# Patient Record
Sex: Female | Born: 1965 | Race: White | Hispanic: No | Marital: Single | Smoking: Never smoker
Health system: Southern US, Community
[De-identification: ages and names within clinical notes are randomized; demographics above are authoritative.]

## PROBLEM LIST (undated history)

## (undated) DIAGNOSIS — M1990 Unspecified osteoarthritis, unspecified site: Secondary | ICD-10-CM

## (undated) DIAGNOSIS — K219 Gastro-esophageal reflux disease without esophagitis: Secondary | ICD-10-CM

## (undated) DIAGNOSIS — A0472 Enterocolitis due to Clostridium difficile, not specified as recurrent: Secondary | ICD-10-CM

## (undated) DIAGNOSIS — S060X9A Concussion with loss of consciousness of unspecified duration, initial encounter: Secondary | ICD-10-CM

## (undated) DIAGNOSIS — F329 Major depressive disorder, single episode, unspecified: Secondary | ICD-10-CM

## (undated) DIAGNOSIS — Q211 Atrial septal defect, unspecified: Secondary | ICD-10-CM

## (undated) DIAGNOSIS — R519 Headache, unspecified: Secondary | ICD-10-CM

## (undated) DIAGNOSIS — R51 Headache: Secondary | ICD-10-CM

## (undated) DIAGNOSIS — K589 Irritable bowel syndrome without diarrhea: Secondary | ICD-10-CM

## (undated) DIAGNOSIS — U071 COVID-19: Secondary | ICD-10-CM

## (undated) DIAGNOSIS — Z8719 Personal history of other diseases of the digestive system: Secondary | ICD-10-CM

## (undated) DIAGNOSIS — I499 Cardiac arrhythmia, unspecified: Secondary | ICD-10-CM

## (undated) DIAGNOSIS — S92919A Unspecified fracture of unspecified toe(s), initial encounter for closed fracture: Secondary | ICD-10-CM

## (undated) DIAGNOSIS — R0600 Dyspnea, unspecified: Secondary | ICD-10-CM

## (undated) DIAGNOSIS — L570 Actinic keratosis: Secondary | ICD-10-CM

## (undated) DIAGNOSIS — R011 Cardiac murmur, unspecified: Secondary | ICD-10-CM

## (undated) DIAGNOSIS — T7840XA Allergy, unspecified, initial encounter: Secondary | ICD-10-CM

## (undated) DIAGNOSIS — D649 Anemia, unspecified: Secondary | ICD-10-CM

## (undated) DIAGNOSIS — Z8619 Personal history of other infectious and parasitic diseases: Secondary | ICD-10-CM

## (undated) DIAGNOSIS — F32A Depression, unspecified: Secondary | ICD-10-CM

## (undated) HISTORY — PX: CARDIAC SURGERY: SHX584

## (undated) HISTORY — DX: Allergy, unspecified, initial encounter: T78.40XA

## (undated) HISTORY — DX: Depression, unspecified: F32.A

## (undated) HISTORY — DX: Major depressive disorder, single episode, unspecified: F32.9

## (undated) HISTORY — DX: Enterocolitis due to Clostridium difficile, not specified as recurrent: A04.72

## (undated) HISTORY — DX: Personal history of other infectious and parasitic diseases: Z86.19

## (undated) HISTORY — PX: ABDOMINAL HYSTERECTOMY: SHX81

## (undated) HISTORY — DX: Actinic keratosis: L57.0

---
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
## 2004-10-28 ENCOUNTER — Ambulatory Visit: Payer: Self-pay | Admitting: Family Medicine

## 2005-06-01 ENCOUNTER — Ambulatory Visit: Payer: Self-pay | Admitting: Unknown Physician Specialty

## 2005-06-04 ENCOUNTER — Emergency Department: Payer: Self-pay | Admitting: Emergency Medicine

## 2006-01-08 ENCOUNTER — Emergency Department: Payer: Self-pay | Admitting: Internal Medicine

## 2008-07-22 ENCOUNTER — Emergency Department: Payer: Self-pay | Admitting: Unknown Physician Specialty

## 2008-08-07 ENCOUNTER — Ambulatory Visit: Payer: Self-pay | Admitting: Unknown Physician Specialty

## 2008-12-21 ENCOUNTER — Ambulatory Visit: Payer: Self-pay | Admitting: Unknown Physician Specialty

## 2009-01-08 ENCOUNTER — Inpatient Hospital Stay: Payer: Self-pay | Admitting: Unknown Physician Specialty

## 2009-01-08 HISTORY — PX: BILATERAL SALPINGOOPHORECTOMY: SHX1223

## 2009-01-08 HISTORY — PX: TOTAL ABDOMINAL HYSTERECTOMY W/ BILATERAL SALPINGOOPHORECTOMY: SHX83

## 2009-01-08 HISTORY — PX: SUPRACERVICAL ABDOMINAL HYSTERECTOMY: SHX5393

## 2009-11-03 ENCOUNTER — Emergency Department: Payer: Self-pay | Admitting: Emergency Medicine

## 2009-12-30 ENCOUNTER — Ambulatory Visit: Payer: Self-pay | Admitting: Unknown Physician Specialty

## 2009-12-30 LAB — HM COLONOSCOPY

## 2010-09-26 LAB — HM MAMMOGRAPHY

## 2011-06-02 DIAGNOSIS — J309 Allergic rhinitis, unspecified: Secondary | ICD-10-CM | POA: Insufficient documentation

## 2011-06-02 DIAGNOSIS — G43909 Migraine, unspecified, not intractable, without status migrainosus: Secondary | ICD-10-CM | POA: Insufficient documentation

## 2011-09-16 DIAGNOSIS — E559 Vitamin D deficiency, unspecified: Secondary | ICD-10-CM | POA: Insufficient documentation

## 2011-09-16 DIAGNOSIS — E8941 Symptomatic postprocedural ovarian failure: Secondary | ICD-10-CM | POA: Insufficient documentation

## 2011-09-16 DIAGNOSIS — E8940 Asymptomatic postprocedural ovarian failure: Secondary | ICD-10-CM | POA: Insufficient documentation

## 2012-01-13 DIAGNOSIS — E561 Deficiency of vitamin K: Secondary | ICD-10-CM | POA: Insufficient documentation

## 2012-01-13 DIAGNOSIS — E54 Ascorbic acid deficiency: Secondary | ICD-10-CM | POA: Insufficient documentation

## 2012-06-13 DIAGNOSIS — K625 Hemorrhage of anus and rectum: Secondary | ICD-10-CM | POA: Insufficient documentation

## 2012-06-13 DIAGNOSIS — F321 Major depressive disorder, single episode, moderate: Secondary | ICD-10-CM | POA: Insufficient documentation

## 2012-08-02 ENCOUNTER — Emergency Department: Payer: Self-pay | Admitting: Emergency Medicine

## 2012-08-02 LAB — COMPREHENSIVE METABOLIC PANEL
Alkaline Phosphatase: 72 U/L (ref 50–136)
Anion Gap: 10 (ref 7–16)
BUN: 4 mg/dL — ABNORMAL LOW (ref 7–18)
Bilirubin,Total: 1.2 mg/dL — ABNORMAL HIGH (ref 0.2–1.0)
Co2: 24 mmol/L (ref 21–32)
Creatinine: 0.81 mg/dL (ref 0.60–1.30)
EGFR (Non-African Amer.): >60
Glucose: 119 mg/dL — ABNORMAL HIGH (ref 65–99)
Potassium: 2.9 mmol/L — ABNORMAL LOW (ref 3.5–5.1)
SGOT(AST): 26 U/L (ref 15–37)
Sodium: 143 mmol/L (ref 136–145)
Total Protein: 7.5 g/dL (ref 6.4–8.2)

## 2012-08-02 LAB — CBC
MCH: 27 pg (ref 26.0–34.0)
MCHC: 34.7 g/dL (ref 32.0–36.0)
WBC: 4.9 10*3/uL (ref 3.6–11.0)

## 2012-08-04 DIAGNOSIS — T7411XA Adult physical abuse, confirmed, initial encounter: Secondary | ICD-10-CM | POA: Insufficient documentation

## 2012-10-05 DIAGNOSIS — R768 Other specified abnormal immunological findings in serum: Secondary | ICD-10-CM | POA: Insufficient documentation

## 2013-01-25 DIAGNOSIS — J9600 Acute respiratory failure, unspecified whether with hypoxia or hypercapnia: Secondary | ICD-10-CM | POA: Insufficient documentation

## 2013-01-25 DIAGNOSIS — J9690 Respiratory failure, unspecified, unspecified whether with hypoxia or hypercapnia: Secondary | ICD-10-CM | POA: Insufficient documentation

## 2013-03-06 ENCOUNTER — Ambulatory Visit: Payer: Self-pay | Admitting: Family Medicine

## 2013-03-20 ENCOUNTER — Ambulatory Visit: Payer: Self-pay | Admitting: Family Medicine

## 2013-04-26 ENCOUNTER — Emergency Department: Payer: Self-pay | Admitting: Emergency Medicine

## 2013-04-28 DIAGNOSIS — R079 Chest pain, unspecified: Secondary | ICD-10-CM | POA: Insufficient documentation

## 2013-04-28 DIAGNOSIS — Q211 Atrial septal defect, unspecified: Secondary | ICD-10-CM | POA: Insufficient documentation

## 2013-04-28 DIAGNOSIS — I370 Nonrheumatic pulmonary valve stenosis: Secondary | ICD-10-CM

## 2013-05-04 ENCOUNTER — Ambulatory Visit: Payer: Self-pay | Admitting: Family Medicine

## 2013-06-21 DIAGNOSIS — I351 Nonrheumatic aortic (valve) insufficiency: Secondary | ICD-10-CM | POA: Insufficient documentation

## 2013-06-21 DIAGNOSIS — I340 Nonrheumatic mitral (valve) insufficiency: Secondary | ICD-10-CM | POA: Insufficient documentation

## 2013-06-21 DIAGNOSIS — I071 Rheumatic tricuspid insufficiency: Secondary | ICD-10-CM | POA: Insufficient documentation

## 2013-06-28 ENCOUNTER — Ambulatory Visit: Payer: Self-pay | Admitting: Neurology

## 2013-07-03 DIAGNOSIS — R42 Dizziness and giddiness: Secondary | ICD-10-CM | POA: Insufficient documentation

## 2013-07-03 DIAGNOSIS — S060X9A Concussion with loss of consciousness of unspecified duration, initial encounter: Secondary | ICD-10-CM | POA: Insufficient documentation

## 2013-07-03 DIAGNOSIS — S060XAA Concussion with loss of consciousness status unknown, initial encounter: Secondary | ICD-10-CM | POA: Insufficient documentation

## 2013-07-03 DIAGNOSIS — M79609 Pain in unspecified limb: Secondary | ICD-10-CM | POA: Insufficient documentation

## 2013-07-03 DIAGNOSIS — M542 Cervicalgia: Secondary | ICD-10-CM | POA: Insufficient documentation

## 2013-07-03 HISTORY — DX: Concussion with loss of consciousness status unknown, initial encounter: S06.0XAA

## 2013-07-03 HISTORY — DX: Concussion with loss of consciousness of unspecified duration, initial encounter: S06.0X9A

## 2013-07-25 DIAGNOSIS — K5900 Constipation, unspecified: Secondary | ICD-10-CM | POA: Insufficient documentation

## 2013-09-19 ENCOUNTER — Inpatient Hospital Stay: Payer: Self-pay | Admitting: Internal Medicine

## 2013-09-19 LAB — URINALYSIS, COMPLETE
BACTERIA: NONE SEEN
BILIRUBIN, UR: NEGATIVE
Blood: NEGATIVE
GLUCOSE, UR: NEGATIVE mg/dL (ref 0–75)
Ketone: NEGATIVE
Leukocyte Esterase: NEGATIVE
Nitrite: NEGATIVE
PH: 6 (ref 4.5–8.0)
PROTEIN: NEGATIVE
RBC,UR: NONE SEEN /HPF (ref 0–5)
SPECIFIC GRAVITY: 1.004 (ref 1.003–1.030)
WBC UR: <1 /HPF (ref 0–5)

## 2013-09-19 LAB — CBC WITH DIFFERENTIAL/PLATELET
BASOS ABS: 0 10*3/uL (ref 0.0–0.1)
Basophil %: 0.7 %
EOS PCT: 2.1 %
Eosinophil #: 0.1 10*3/uL (ref 0.0–0.7)
HCT: 35.3 % (ref 35.0–47.0)
HGB: 11.9 g/dL — ABNORMAL LOW (ref 12.0–16.0)
LYMPHS ABS: 0.9 10*3/uL — AB (ref 1.0–3.6)
Lymphocyte %: 21.6 %
MCH: 27.1 pg (ref 26.0–34.0)
MCHC: 33.7 g/dL (ref 32.0–36.0)
MCV: 80 fL (ref 80–100)
Monocyte #: 0.3 x10 3/mm (ref 0.2–0.9)
Monocyte %: 7.8 %
Neutrophil #: 2.8 10*3/uL (ref 1.4–6.5)
Neutrophil %: 67.8 %
Platelet: 147 10*3/uL — ABNORMAL LOW (ref 150–440)
RBC: 4.4 10*6/uL (ref 3.80–5.20)
RDW: 14.6 % — ABNORMAL HIGH (ref 11.5–14.5)
WBC: 4.1 10*3/uL (ref 3.6–11.0)

## 2013-09-19 LAB — COMPREHENSIVE METABOLIC PANEL
ALBUMIN: 3.6 g/dL (ref 3.4–5.0)
ALK PHOS: 72 U/L
Anion Gap: 7 (ref 7–16)
BUN: 11 mg/dL (ref 7–18)
Bilirubin,Total: 1.1 mg/dL — ABNORMAL HIGH (ref 0.2–1.0)
CALCIUM: 8.6 mg/dL (ref 8.5–10.1)
CO2: 28 mmol/L (ref 21–32)
CREATININE: 0.9 mg/dL (ref 0.60–1.30)
Chloride: 110 mmol/L — ABNORMAL HIGH (ref 98–107)
EGFR (African American): >60
GLUCOSE: 88 mg/dL (ref 65–99)
OSMOLALITY: 288 (ref 275–301)
Potassium: 4.2 mmol/L (ref 3.5–5.1)
SGOT(AST): 35 U/L (ref 15–37)
SGPT (ALT): 41 U/L
Sodium: 145 mmol/L (ref 136–145)
TOTAL PROTEIN: 6.5 g/dL (ref 6.4–8.2)

## 2013-09-19 LAB — LIPASE, BLOOD: Lipase: 106 U/L (ref 73–393)

## 2013-09-20 LAB — BASIC METABOLIC PANEL
Anion Gap: 11 (ref 7–16)
BUN: 14 mg/dL (ref 7–18)
CREATININE: 0.93 mg/dL (ref 0.60–1.30)
Calcium, Total: 8.4 mg/dL — ABNORMAL LOW (ref 8.5–10.1)
Chloride: 110 mmol/L — ABNORMAL HIGH (ref 98–107)
Co2: 25 mmol/L (ref 21–32)
EGFR (African American): >60
EGFR (Non-African Amer.): >60
Glucose: 86 mg/dL (ref 65–99)
Osmolality: 290 (ref 275–301)
POTASSIUM: 3.9 mmol/L (ref 3.5–5.1)
SODIUM: 146 mmol/L — AB (ref 136–145)

## 2013-09-20 LAB — CBC WITH DIFFERENTIAL/PLATELET
BASOS PCT: 0.3 %
Basophil #: 0 10*3/uL (ref 0.0–0.1)
Eosinophil #: 0 10*3/uL (ref 0.0–0.7)
Eosinophil %: 0.6 %
HCT: 31.9 % — AB (ref 35.0–47.0)
HGB: 10.9 g/dL — ABNORMAL LOW (ref 12.0–16.0)
LYMPHS PCT: 12.8 %
Lymphocyte #: 0.6 10*3/uL — ABNORMAL LOW (ref 1.0–3.6)
MCH: 27.5 pg (ref 26.0–34.0)
MCHC: 34.2 g/dL (ref 32.0–36.0)
MCV: 80 fL (ref 80–100)
Monocyte #: 0.2 x10 3/mm (ref 0.2–0.9)
Monocyte %: 4.7 %
NEUTROS ABS: 3.9 10*3/uL (ref 1.4–6.5)
Neutrophil %: 81.6 %
Platelet: 116 10*3/uL — ABNORMAL LOW (ref 150–440)
RBC: 3.96 10*6/uL (ref 3.80–5.20)
RDW: 14.3 % (ref 11.5–14.5)
WBC: 4.8 10*3/uL (ref 3.6–11.0)

## 2013-09-20 LAB — PRO B NATRIURETIC PEPTIDE: B-Type Natriuretic Peptide: 540 pg/mL — ABNORMAL HIGH (ref 0–125)

## 2013-09-21 LAB — CBC WITH DIFFERENTIAL/PLATELET
Basophil #: 0 10*3/uL (ref 0.0–0.1)
Basophil %: 0.7 %
EOS ABS: 0 10*3/uL (ref 0.0–0.7)
EOS PCT: 0.5 %
HCT: 31.6 % — ABNORMAL LOW (ref 35.0–47.0)
HGB: 10.5 g/dL — ABNORMAL LOW (ref 12.0–16.0)
Lymphocyte #: 0.9 10*3/uL — ABNORMAL LOW (ref 1.0–3.6)
Lymphocyte %: 17.1 %
MCH: 26.9 pg (ref 26.0–34.0)
MCHC: 33.2 g/dL (ref 32.0–36.0)
MCV: 81 fL (ref 80–100)
MONO ABS: 0.4 x10 3/mm (ref 0.2–0.9)
MONOS PCT: 8.2 %
NEUTROS PCT: 73.5 %
Neutrophil #: 4 10*3/uL (ref 1.4–6.5)
Platelet: 121 10*3/uL — ABNORMAL LOW (ref 150–440)
RBC: 3.91 10*6/uL (ref 3.80–5.20)
RDW: 14.7 % — ABNORMAL HIGH (ref 11.5–14.5)
WBC: 5.4 10*3/uL (ref 3.6–11.0)

## 2013-09-21 LAB — URINALYSIS, COMPLETE
Bacteria: NONE SEEN
RBC,UR: 195 /HPF (ref 0–5)
Specific Gravity: 1.03 (ref 1.003–1.030)
Squamous Epithelial: 14

## 2013-09-21 LAB — BASIC METABOLIC PANEL
ANION GAP: 11 (ref 7–16)
BUN: 17 mg/dL (ref 7–18)
CHLORIDE: 108 mmol/L — AB (ref 98–107)
CO2: 26 mmol/L (ref 21–32)
Calcium, Total: 8.2 mg/dL — ABNORMAL LOW (ref 8.5–10.1)
Creatinine: 1.07 mg/dL (ref 0.60–1.30)
EGFR (African American): >60
EGFR (Non-African Amer.): >60
GLUCOSE: 83 mg/dL (ref 65–99)
OSMOLALITY: 289 (ref 275–301)
Potassium: 3.4 mmol/L — ABNORMAL LOW (ref 3.5–5.1)
Sodium: 145 mmol/L (ref 136–145)

## 2013-09-21 LAB — URINE CULTURE

## 2013-09-22 LAB — CBC WITH DIFFERENTIAL/PLATELET
BASOS ABS: 0 10*3/uL (ref 0.0–0.1)
BASOS PCT: 0.4 %
EOS ABS: 0 10*3/uL (ref 0.0–0.7)
EOS PCT: 0 %
HCT: 33.7 % — ABNORMAL LOW (ref 35.0–47.0)
HGB: 11 g/dL — ABNORMAL LOW (ref 12.0–16.0)
LYMPHS ABS: 0.4 10*3/uL — AB (ref 1.0–3.6)
Lymphocyte %: 8.3 %
MCH: 26.4 pg (ref 26.0–34.0)
MCHC: 32.5 g/dL (ref 32.0–36.0)
MCV: 81 fL (ref 80–100)
MONO ABS: 0.1 x10 3/mm — AB (ref 0.2–0.9)
MONOS PCT: 2.1 %
Neutrophil #: 4.7 10*3/uL (ref 1.4–6.5)
Neutrophil %: 89.2 %
PLATELETS: 123 10*3/uL — AB (ref 150–440)
RBC: 4.15 10*6/uL (ref 3.80–5.20)
RDW: 14.7 % — ABNORMAL HIGH (ref 11.5–14.5)
WBC: 5.2 10*3/uL (ref 3.6–11.0)

## 2013-09-22 LAB — SEDIMENTATION RATE: ERYTHROCYTE SED RATE: 12 mm/h (ref 0–20)

## 2013-09-23 LAB — STOOL CULTURE

## 2013-09-24 LAB — CULTURE, BLOOD (SINGLE)

## 2013-10-02 ENCOUNTER — Emergency Department: Payer: Self-pay | Admitting: Student

## 2013-10-02 LAB — COMPREHENSIVE METABOLIC PANEL
ALK PHOS: 59 U/L
AST: 16 U/L (ref 15–37)
Albumin: 3.6 g/dL (ref 3.4–5.0)
Anion Gap: 5 — ABNORMAL LOW (ref 7–16)
BILIRUBIN TOTAL: 1 mg/dL (ref 0.2–1.0)
BUN: 7 mg/dL (ref 7–18)
CO2: 27 mmol/L (ref 21–32)
Calcium, Total: 9 mg/dL (ref 8.5–10.1)
Chloride: 110 mmol/L — ABNORMAL HIGH (ref 98–107)
Creatinine: 0.85 mg/dL (ref 0.60–1.30)
EGFR (African American): >60
EGFR (Non-African Amer.): >60
GLUCOSE: 81 mg/dL (ref 65–99)
Osmolality: 280 (ref 275–301)
Potassium: 4.3 mmol/L (ref 3.5–5.1)
SGPT (ALT): 27 U/L
Sodium: 142 mmol/L (ref 136–145)
TOTAL PROTEIN: 6.1 g/dL — AB (ref 6.4–8.2)

## 2013-10-02 LAB — CBC WITH DIFFERENTIAL/PLATELET
Basophil #: 0 10*3/uL (ref 0.0–0.1)
Basophil %: 0.9 %
Eosinophil #: 0.1 10*3/uL (ref 0.0–0.7)
Eosinophil %: 1.7 %
HCT: 35.6 % (ref 35.0–47.0)
HGB: 11.7 g/dL — AB (ref 12.0–16.0)
LYMPHS PCT: 19.2 %
Lymphocyte #: 1 10*3/uL (ref 1.0–3.6)
MCH: 26.3 pg (ref 26.0–34.0)
MCHC: 32.9 g/dL (ref 32.0–36.0)
MCV: 80 fL (ref 80–100)
MONOS PCT: 10.2 %
Monocyte #: 0.5 x10 3/mm (ref 0.2–0.9)
NEUTROS ABS: 3.7 10*3/uL (ref 1.4–6.5)
Neutrophil %: 68 %
PLATELETS: 149 10*3/uL — AB (ref 150–440)
RBC: 4.47 10*6/uL (ref 3.80–5.20)
RDW: 15.1 % — ABNORMAL HIGH (ref 11.5–14.5)
WBC: 5.4 10*3/uL (ref 3.6–11.0)

## 2013-10-02 LAB — URINALYSIS, COMPLETE
BLOOD: NEGATIVE
Bacteria: NONE SEEN
Bilirubin,UR: NEGATIVE
GLUCOSE, UR: NEGATIVE mg/dL (ref 0–75)
KETONE: NEGATIVE
LEUKOCYTE ESTERASE: NEGATIVE
NITRITE: NEGATIVE
Ph: 7 (ref 4.5–8.0)
Protein: NEGATIVE
Specific Gravity: 1.005 (ref 1.003–1.030)
Squamous Epithelial: 1
WBC UR: 1 /HPF (ref 0–5)

## 2013-10-02 LAB — LIPASE, BLOOD: LIPASE: 79 U/L (ref 73–393)

## 2013-10-12 ENCOUNTER — Other Ambulatory Visit: Payer: Self-pay | Admitting: Urgent Care

## 2013-10-16 ENCOUNTER — Ambulatory Visit: Payer: Self-pay | Admitting: Family Medicine

## 2014-01-15 LAB — HM PAP SMEAR: HM Pap smear: NEGATIVE

## 2014-01-22 DIAGNOSIS — M79603 Pain in arm, unspecified: Secondary | ICD-10-CM | POA: Insufficient documentation

## 2014-02-08 ENCOUNTER — Emergency Department: Payer: Self-pay | Admitting: Student

## 2014-02-26 DIAGNOSIS — M25519 Pain in unspecified shoulder: Secondary | ICD-10-CM | POA: Insufficient documentation

## 2014-02-26 DIAGNOSIS — M25512 Pain in left shoulder: Secondary | ICD-10-CM

## 2014-02-26 DIAGNOSIS — G8929 Other chronic pain: Secondary | ICD-10-CM | POA: Insufficient documentation

## 2014-02-28 ENCOUNTER — Encounter: Payer: Self-pay | Admitting: Neurology

## 2014-03-01 ENCOUNTER — Ambulatory Visit: Payer: Self-pay | Admitting: Unknown Physician Specialty

## 2014-03-05 ENCOUNTER — Encounter: Payer: Self-pay | Admitting: Neurology

## 2014-03-14 ENCOUNTER — Telehealth: Payer: Self-pay | Admitting: Neurology

## 2014-03-14 NOTE — Telephone Encounter (Signed)
Pt canceled her new patient appt for 04-09-14 and did not resch appt will let Dr Melrose Nakayama office

## 2014-03-21 DIAGNOSIS — M5412 Radiculopathy, cervical region: Secondary | ICD-10-CM | POA: Insufficient documentation

## 2014-03-21 DIAGNOSIS — M5030 Other cervical disc degeneration, unspecified cervical region: Secondary | ICD-10-CM | POA: Insufficient documentation

## 2014-04-09 ENCOUNTER — Ambulatory Visit: Payer: Self-pay | Admitting: Neurology

## 2014-05-20 ENCOUNTER — Emergency Department
Admit: 2014-05-20 | Disposition: A | Discharge status: Home / Self Care | Payer: Self-pay | Admitting: Emergency Medicine

## 2014-05-26 NOTE — Consult Note (Signed)
PATIENT NAME:  Erin Richards, Erin Richards MR#:  [id number] DATE OF BIRTH:  [birth year]  DATE OF CONSULTATION:  09/19/2013  REFERRING PHYSICIAN:    CONSULTING PHYSICIAN:  Deosha Werden A. Marina Gravel, MD  HISTORY: This is a 49 year old white female with approximately 24 hours of abdominal pain which she said started yesterday around 7:00 p.m. after a normal lunch and most of her day. The patient states that she had a large amount of bloody output from her rectum filling her toilet bowl at least twice which she could not see the bottom of.  She then started having some diffuse lower abdominal pain which has now focused itself on the right lower quadrant. She called her medical doctor which prompted her to go to Emergency Room.   Of note, the patient has had a previous total laparoscopic supracervical hysterectomy in the year 2010; of note colonoscopy, performed by Dr. Vira Agar in the year 2011, demonstrated hemorrhoids. The patient has a history of hemorrhoids. She denies any fever, sick contacts, or anorexia. She has had no nausea, no vomiting.   Imaging in the Emergency Room after normal liver function tests and white count and urinalysis demonstrates normal-appearing appendix, and what appears to be an incidental jejunal intussusception. No signs of bowel obstruction, or mass around the lesion. The appendix was visualized in the right lower quadrant, and found to be unremarkable. There is no periappendiceal inflammation that I can appreciate; pericholecystic fluid, fecalith or free air.   ALLERGIES: MULTIPLE INCLUDING AVELOX, BIAXIN, CLINDAMYCIN, CODEINE, DOXYCYCLINE, FENTANYL, HYDROCODONE, KETOROLAC, LEVAQUIN, QUINOLONE, PERCOCET, MOXIFLOXACIN, MORPHINE.   HOME MEDICATIONS:  1.  Astelin.  2.  Zyrtec.   4.  Combivent.  5.  Estradiol.  6.  Flunisolide.  7.  Fluticasone.  8.  Hyoscyamine.  10.  Singulair.  11.  Nortriptyline.  12.  Proventil.  13.  Vitamin C.   PAST MEDICAL HISTORY: Significant for asthma, history of  dysfunctional uterine bleeding, history of rectal bleeding, and constipation in the past.   PAST SURGICAL HISTORY: As described above.   REVIEW OF SYSTEMS: Positive for rectal bleeding, abdominal pain; negative for nausea, vomiting, diarrhea; positive for dysuria.   PHYSICAL EXAMINATION: GENERAL: The patient is in no obvious distress with her significant other at bedside.  VITAL SIGNS: BMI is 28.2, weight 130 pounds, she is 4 foot 9, temperature is 97.9, pulse of 60; respiratory rate of 18, blood pressure is 114/54.  LUNGS: Clear.  HEART: Regular rate, and rhythm. No murmurs.  ABDOMEN: Soft minimally tender in the right lower quadrant to deep palpation. There is no Rovsing's sign. There are no peritoneal signs.  RECTAL: Demonstrates no obvious blood, heme test was not performed; there are a few external hemorrhoidal tags, no rectal masses, normal tone.  EXTREMITIES: Warm, and well perfused.  NEUROLOGIC AND PSYCHIATRIC: Unremarkable.   LABORATORY VALUES: Urinalysis is negative, white count is 4.1, with a normal differential; hemoglobin of 11.9, platelet count 147,000, liver function tests are unremarkable, except for bilirubin of 1.1, electrolytes are unremarkable.   IMPRESSION: This patient has abdominal pain, rectal bleeding, dysuria despite normal urinalysis; multiple medical allergies. I do not see any clinical signs of acute appendicitis at this time; furthermore, the CT scan with approximately 24 hours' worth of abdominal pain history demonstrates in my opinion a normal-appearing appendix.  There are no secondary signs of acute appendicitis. There appears to be an incidental jejunal intussusception without mass or obstruction.   RECOMMENDATIONS: Medical admission, consideration of GI work-up. I will follow along with you.  I discussed this with the patient and her significant other, and they agree for this plan of action.    ____________________________ Jeannette How Marina Gravel,  MD mab:nt D: 09/19/2013 17:47:08 ET T: 09/19/2013 18:43:43 ET JOB#: [id number]  cc: Elta Guadeloupe A. Marina Gravel, MD, <Dictator> Hortencia Conradi MD ELECTRONICALLY SIGNED 09/24/2013 18:22
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
## 2014-05-26 NOTE — Consult Note (Signed)
Rectal exam with nurse present showed no blood on exam finger.  Her bleeding with bowel movements or voiding likely from hemorrhoids.  Abd soft and flat, minimal tenderness in RLQ and RUQ.  Complains of nausea.  Has insp rales bilateral both bases.  Will have nurse medicate her for nausea and start incentive spironmetry.  BNP was elevated to 540, echo did not show any major problems. may need more lasix tomorrow.  Electronic Signatures: Manya Silvas (MD)  (Signed on 19-Aug-15 18:37)  Authored  Last Updated: 19-Aug-15 18:37 by Manya Silvas (MD)

## 2014-05-26 NOTE — H&P (Signed)
PATIENT NAME:  Erin Richards, Erin Richards MR#:  [id number] DATE OF BIRTH:  1965/02/22  DATE OF ADMISSION:  09/19/2013  PRIMARY CARE PHYSICIAN: Dr. Venia Minks.   CHIEF COMPLAINT: Rectal bleeding and left lower quadrant abdominal pain.  HISTORY OF PRESENT ILLNESS: A 49 year old female who presents with the above complaint.  Patient says over the past days she has been having right lower quadrant abdominal pain. She had a CT scan in the ER which shows possible appendicitis or intussusception.  Dr. Marina Gravel saw the patient in the ER.  As per his note, he did not see evidence of acute appendicitis on CT scan.  As per his consult, compilation of symptoms is incongruent with a surgical diagnosis at this time.  Hospitalist was called for admission due to the right lower quadrant abdominal pain. The patient has also said since this morning, she has had rectal bleeding.  REVIEW OF SYSTEMS: CONSTITUTIONAL: No fever. Positive fatigue, weakness.  EYES: No blurred  or double vision, glaucoma or cataracts.  EARS, NOSE, AND THROAT: No ear pain, hearing loss, or any drainage or postnasal drip.  RESPIRATORY: No cough, wheezing, or hemoptysis.  CARDIOVASCULAR: No chest pain or orthopnea.  GASTROINTESTINAL: Positive nausea. Positive abdominal pain. No vomiting, diarrhea, melena, or ulcers. Positive hematochezia.  GENITOURINARY:  No dysuria or hematuria.  ENDOCRINE: No polyuria or polydipsia.  HEMATOLOGY AND LYMPHATICS: No anemia or easy bruising.  SKIN: No rash or lesions.  MUSCULOSKELETAL: No pain in shoulders. She has chronic pain in her arm.  NEUROLOGIC:  No known history of CVA, TIA, or seizures. PSYCHIATRIC: Positive depression.   PAST MEDICAL HISTORY:  1.  Irritable bowel syndrome.  2.  Depression.  3.  Chronic pain.   MEDICATIONS:  1.  Zyrtec daily.  2.  Mobic.  3.  Estradiol.  4.  Celexa 20 mg daily.   SOCIAL HISTORY: No tobacco, alcohol or drug use.   ALLERGIES:  ALLERGIES TO MULTIPLE ALLERGIES INCLUDING  AVELOX, BIAXIN, CLINDAMYCIN, CODEINE, DOXYCYCLINE, FENTANYL, HYDROCODONE/TYLENOL, KETOROLAC, LEVAQUIN, MACRODANTIN, METFORMIN, PIOGLITAZONE,  MORPHINE, MOXIFLOXACIN, MACROBID, PERCOCET, PERIGUARD, QUINOLONES, TETRACYCLINE, TRAMADOL, TUSSIN, AND ULTRAM.   FAMILY HISTORY: Positive history of CAD. Her mother died when she was very young.   PAST SURGICAL HISTORY: She had an ASD valve.  PHYSICAL EXAMINATION:  VITAL SIGNS: Temperature 97.9, pulse of 62, respiratory rate is 20, blood pressure 108/61, 97% on room air.  GENERAL: The patient is alert and oriented, in moderate distress due to the abdominal pain.  HEENT: Head is atraumatic. Pupils are reactive.  Sclerae anicteric.  Mucous membranes are dry. Oropharynx is clear.  NECK: Supple without JVD, carotid bruit, or enlarged thyroid.  CARDIOVASCULAR: Regular rate and rhythm. She had a 3/6 holosystolic murmur.  No  gallops, or rubs.  PMI is not displaced.   LUNGS:  Clear to auscultation without crackles, rales, rhonchi, or wheezing. Normal to percussion.  ABDOMEN: She has tenderness of the right lower quadrant without any rebound or guarding. EXTREMITIES:  No clubbing, cyanosis, or edema.  NEUROLOGIC:  Cranial nerves II-XII are intact.  There are no focal deficits.  SKIN: Without rash or lesion.  LABORATORY DATA: White blood cells 4, hemoglobin 12, hematocrit 35.3, platelets 147,000. Sodium 145, potassium 4.2, chloride 110, bicarbonate 28, BUN 11, creatinine 0.90, glucose 80, ALT 41, AST 35, total protein 6.5, albumin is 3.6, lipase is 106.   Urinalysis is negative for LCE or nitrites.   CT of  the abdomen shows focal area of jejunal intussusception in the left mid abdomen with  small bowel thickening in the jejunum just proximal to this intussusception.  There is no obstruction in this area.  Finding may be secondary to localized enteritis.   Appendix is not enlarged; however, the borders of the appendix are somewhat ill defined, could be equivocal  for acute appendiceal inflammation.   ASSESSMENT AND PLAN: A 49 year old female with history of irritable bowel syndrome seen by gastroenterology as an outpatient, who presents with right lower quadrant pain, as well as hematochezia.   1.  Right lower quadrant abdominal pain. CT findings were concerning for appendicitis or intussusception, but Dr. Marina Gravel, our surgeon, has seen the patient.  Does not think this is acute appendicitis or a surgical indication at this time. The patient will be admitted to the medical service and we will continue to monitor her abdominal exam. Order a KUB in the a.m. and may need follow-up imaging.  Also, go ahead and consult GI. The patient has multiple allergies, so we will Toradol for pain relief.   2.  Hematochezia, could be a possible enteritis. I placed the patient on Zosyn and stool cultures were ordered in the ER.   3.  Chronic pain. The patient is n.p.o., so we will hold Mobic for now, but try Toradol.   4.  Depression. We will hold her p.o. medications for now.   5.  The patient is FULL CODE STATUS.  TIME SPENT:  Approximately 40 minutes.    ____________________________ Donell Beers. Benjie Karvonen, MD spm:TT D: 09/19/2013 18:11:26 ET T: 09/19/2013 18:37:56 ET JOB#: [id number]  cc: Hanya Guerin P. Benjie Karvonen, MD, <Dictator> Jerrell Belfast, MD Donell Beers Ch[place] Howington MD ELECTRONICALLY SIGNED 09/19/2013 19:56

## 2014-05-26 NOTE — Consult Note (Signed)
PATIENT NAME:  Erin Richards, Erin Richards MR#:  [id number] DATE OF BIRTH:  [birth year]  DATE OF CONSULTATION:  09/20/2013  REFERRING PHYSICIAN:   CONSULTING PHYSICIAN:  Manya Silvas, MD  HISTORY OF PRESENT ILLNESS: The patient is a 49 year old white female who presented with onset of right lower quadrant pain a couple of days ago. She had some loose watery stools. Last normal bowel movement was Sunday, August 16. She noted a little bit of bright red blood on the tissue. She does have a previous history of previous internal hemorrhoids found on colonoscopy. After having the bright blood on the tissue, she noticed a little blood coming from her rectum when she urinated. It was bright red blood and she saw some clots on the tissue. She also reported left lower quadrant pain and coming after the urination with some blood from the rectum.   The patient reports 3 or 4 episodes of watery diarrhea without blood on Tuesday. She has had continuous nausea since the pain began. Denies fever, chills, or sweats. Reports 2 episodes of small hematemesis on Tuesday.   The patient had a spell of left lower quadrant pain suggesting diverticulitis and was treated with Augmentin in June 2015. Her last colonoscopy was 12/30/2009, that showed small internal hemorrhoids. The patient carries a diagnosis of IBS; constipation predominant occasional with diarrhea.   PAST MEDICAL HISTORY: Includes problem with headaches, dizziness, concussion, neck pain, extremity pain, irritable bowel, and constipation.   ALLERGIES: MULTIPLE MEDICINES INCLUDING AVELOX, BIAXIN, CLINDAMYCIN, CODEINE, DOXYCYCLINE, FENTANYL, HYDROCODONE, KETOROLAC, LEVAQUIN, MACRODANTIN, METFORMIN, ACTOS, MORPHINE, MACROBID, PERCOCET, TETRACYCLINE, TRAMADOL, TUSSINS, AND ULTRAM.   FAMILY HISTORY: Positive for coronary artery disease.   PAST MEDICAL HISTORY: She had atrial septal defect. This was repaired in the past.   PHYSICAL EXAMINATION: GENERAL: White female,  somewhat flat affect, lying in bed.  VITAL SIGNS: Temperature 97.6, pulse 70, respirations 18, blood pressure 109/62, oxygen saturations run 87 to 93 on 2 to 3 liters.  HEENT: Sclerae anicteric. Conjunctivae negative.  NECK: Supple  CHEST: Clear but there are inspiratory crackles at both bases bilaterally with a deep breath.  HEART: A 2 to 3/6 holosystolic murmur.  ABDOMEN: Mild tenderness in the right lower quadrant. No rebound tenderness. No peritoneal signs. No pain with coughing. No tenderness with pressing the stethoscope into her abdomen.  SKIN: No rashes.   LABS: White count of 4000, hemoglobin 12, platelet count 147,000. Sodium 145, potassium 4.2, chloride 110, bicarbonate 28, BUN 11, creatinine 0.9, glucose 80, ALT 41, AST 35, albumin 3.6, lipase 106.   CAT scan of the abdomen and pelvis done August 18 showed diffuse fatty change in the liver. The liver measures 16.5 cm. Prominent spleen. Urinary bladder wall borderline thickened. Scattered sigmoid diverticula without diverticulitis. A questionable localized area of intussusception involving a loop of jejunum in the left mid abdomen; however, contrast flowed through the area, and there is no obstruction. Questionable mild duodenal wall thickening.  Appendix was not enlarged. The borders of the appendix were somewhat ill-defined, felt to be somewhat equivocal findings for acute appendiceal inflammation.   ASSESSMENT: Given the onset of vomiting and diarrhea with a low white count, may be that she is having a viral syndrome. The labs today show a new finding of beta-type natriuretic peptide 540. Repeat white count was 4.8, platelet count was 116,000. Blood culture showed no growth. The abdomen exam does not show evidence of an acute abdomen requiring surgery. Initial plans were to consider upper endoscopy; however, in light of  inspiratory crackles at the bases, and some desaturation requiring oxygen, this plan was canceled. I would observe the  patient for now and will follow physical examination and laboratories. I do think the bleeding she is to referring to likely is from internal hemorrhoids. Cannot rule out the possibility of diverticular bleed. We will follow with you.   ____________________________ Manya Silvas, MD rte:NT D: 09/20/2013 13:22:00 ET T: 09/20/2013 13:53:21 ET JOB#: [id number]  cc: Manya Silvas, MD, <Dictator> Manya Silvas MD ELECTRONICALLY SIGNED 10/01/2013 12:48

## 2014-05-26 NOTE — Discharge Summary (Signed)
PATIENT NAME:  Erin Richards, Erin Richards MR#:  [id number] DATE OF BIRTH:  1965/05/24  DATE OF ADMISSION:  09/19/2013  DATE OF DISCHARGE:  09/23/2013   DISCHARGE DIAGNOSES:  1.  Abdominal pain, gallbladder wall thickening, HIDA scan done.  2.  Hematochezia likely hemorrhoids.  3.  Hematemesis started on protective medications by gastrointestinal.  Follow-up in clinic. No work-up suggested by gastrointestinal in this admission.  4.  Depression.  5.  Asthma.   CONDITION ON DISCHARGE: Stable.  MEDICATION ON DISCHARGE:   1.  Estradiol 0.5 mg oral tablet once a day.  2.  Citalopram 20 mg oral tablet once a day.  3.  Montelukast 10 mg oral tablet once a day.   5.  Combivent 1 puff inhalation every 4 hours as needed for shortness of breath.  6.  Meloxicam 15 mg oral once a day.  7.  Prednisone 10 mg oral once a day for 3 days.  8.  Loperamide 2 mg oral 3 times a day as needed for diarrhea.  9.  Azithromycin 500 mg oral once a day for 3 days.  10. Sucralfate 1 gram oral tablet 4 times a day before meals and at bedtime.  11. Pantoprazole 40 mg oral delayed-release tablet 2 times a day for 30 days.  12. Cyclobenzaprine 10 mg oral tablet every 8 hours as needed for muscle spasm.  13. Fluticasone inhalation and also 1 puff 2 times a day.  14. Amoxicillin and clavulanate 500/125 mg oral tablet every 8 hours for 5 days.   DIET ON DISCHARGE: Regular diet, consistency regular.   FOLLOW UP:  Advised to follow in GI clinic in 1 to 2 weeks with Dr. Vira Agar.      HISTORY OF PRESENT ILLNESS:  As by Dr. Eliane Decree on 18th of August, this is a 49 year old female with a complaint of rectal bleeding, left lower quadrant abdominal pain. As per CT scan in the ER, it showed possible appendicitis or intussusception and the ER physician spoke to the surgical doctor on call.  As per him, there was no evidence of appendicitis.  He suggested to admit to medical team for symptomatic management.   HOSPITAL COURSE AND STAY:   Because of these abdominal complaints and having some blood in the stool, GI consult was also called in and they suggested just to do symptomatic management of her symptoms.  Advised to start her on sucralfate and PPI oral b.i.d. for protection.  Her bleeding issues stopped.  CT scan of the abdomen showed some finding of questionable appendicitis, but surgery denied for that and the patient had improvement in her symptoms. As her complaint continued to having pain on the right upper quadrant, we did a sonogram of that right upper quadrant which showed mild thickening of the wall of gallbladder.  GI suggested to get a HIDA scan to see the functional status of gallbladder and there was a good feeling of the gallbladder so we are discharging the patient just on antibiotic as per GI recommendation. The patient tolerated a regular diet without any problem.   Other medical issues in the hospital, dyspnea and hypoxia. The patient had this complaint. Chest x-ray and CT of the chest were done which showed some bibasilar ground-glass opacities and possible atypical infection versus edema, so started on erythromycin for atypical infection, Lasix 1 dose was given without significant improvement.  Echocardiogram was done which showed normal ejection fraction, so we are discharging with erythromycin oral antibiotics for 3 more days.  Gradual  improvement in the patient came back having normal oxygen saturation on room air.   Asthma. There was some bleeding on admission so started on Solu-Medrol, but later on with improvement switched to oral steroid and tapering.   Hematuria. Likely it was traumatic due to Foley catheter irritation and it stopped after removal of the catheter.   Depression. Continue Celexa.  Consult of Psychiatrist was called in, and he suggested to her psychotherapy as outpatient.  CONSULTATIONS IN THE HOSPITAL:  Surgical consult with Dr. Sherri Rad.  GI consult with Dr. Vira Agar, psychiatry consult with  Dr. Alethia Berthold.   IMPORTANT LABORATORY DATA:   Urinalysis is grossly negative. WBC count 4.1, hemoglobin is 11.9, platelet count 147, MCV is 80.  BUN 11, creatinine 0.19. Sodium is 145, potassium is 4.2, chloride is 110, CO2 is 20. Calcium is 8.6. SGPT 41, SGOT 335, total protein 6.5, albumin 3.6, osmolality 288. CT scan of the abdomen and pelvis with contrast on admission, spleen is prominent, focal area of jejunal intussusception.  In the left mid-abdomen, prominence of lower lobe pulmonary vascular structure.  Liver had  fatty changes.  Appendix is not enlarged, however borders of the appendix are somewhat ill-defined. Blood cultures remained negative which were collected on admission.  Stool cultures remained negative.  Ova and parasite revealed negative in the stool. Echocardiogram was done which showed left ejection fractions 60% to 65%, normal global left ventricular function, mild left ventricular hypertrophy, decreased left ventricular internal cavity size, mild to moderate mitral valve regurgitation.  Ultrasound of abdomen also was done which showed focal gallbladder wall thickening with visual small amount of pericholecystic fluid.  No other evidence of acute cholecystitis.  PIPIDA biliary nuclear scan is done which showed cystic duct is patent.  The gallbladder is visualized as 30 minutes normal hepatic uptake.   TOTAL TIME SPENT ON THIS DISCHARGE: 40 minutes.      ____________________________ Ceasar Lund Anselm Jungling, MD vgv:DT D: 09/23/2013 14:18:38 ET T: 09/23/2013 15:42:24 ET JOB#: [id number]  cc: Ceasar Lund. Anselm Jungling, MD, <Dictator> Jerrell Belfast, MD Manya Silvas, MD  Vaughan Basta MD ELECTRONICALLY SIGNED 10/08/2013 9:16

## 2014-05-26 NOTE — Consult Note (Signed)
Brief Consult Note: Diagnosis: rectal bleeding, abdominal pain, incidental jejunal intussusception,.   Patient was seen by consultant.   Recommend further assessment or treatment.   Discussed with Attending MD.   Comments: I see no evidence of acute appendicitis on CT scan, compilation of symptoms incongruent with a surgical diagnosis at thsi time.  Electronic Signatures: Sherri Rad (MD)  (Signed 18-Aug-15 17:39)  Authored: Brief Consult Note   Last Updated: 18-Aug-15 17:39 by Sherri Rad (MD)

## 2014-05-26 NOTE — Consult Note (Signed)
Pt with heavy family psychiatric bad behaviior in child she is dealing with per husband.   On exam her chest is clear but i can hear clearly a cardiac murmur in her back.  Abd not distended, minimal tenderness mostly RUQ.  She has fatty liver on CT.  Will check [place] to see if any gallstones missed by CT.  While she is on oxygen for low p02 and not bleeding or obstructed I will hold off on EGD as risk/benefit ratio not good, will keep on PPI oral and add in carafate.  Electronic Signatures: Manya Silvas (MD)  (Signed on 20-Aug-15 16:05)  Authored  Last Updated: 20-Aug-15 16:05 by Manya Silvas (MD)

## 2014-05-26 NOTE — Consult Note (Signed)
Pt feeling much better today, up out of bed with smiling, tolerates off oxygen while up in chair.  Due to focal abnomality of gall baldder on [place] discussed with Hospitalist of need for HIDA scan.  If no filling of gall bladder she may need surgery but she may improve with antibiotics alone.  Her cardioresp status seems improved and her lungs are clear to my exam today.  Dr. Rayann Heman will be on call this weekend but will not see unless you call with a problem.  Electronic Signatures: Manya Silvas (MD)  (Signed on 21-Aug-15 18:12)  Authored  Last Updated: 21-Aug-15 18:12 by Manya Silvas (MD)

## 2014-06-03 ENCOUNTER — Emergency Department: Payer: Medicaid Other

## 2014-06-03 ENCOUNTER — Encounter: Payer: Self-pay | Admitting: Emergency Medicine

## 2014-06-03 ENCOUNTER — Inpatient Hospital Stay
Admission: EM | Admit: 2014-06-03 | Discharge: 2014-06-04 | DRG: 392 | Disposition: A | Discharge status: Home / Self Care | Payer: Medicaid Other | Attending: Internal Medicine | Admitting: Internal Medicine

## 2014-06-03 DIAGNOSIS — K219 Gastro-esophageal reflux disease without esophagitis: Secondary | ICD-10-CM | POA: Diagnosis present

## 2014-06-03 DIAGNOSIS — Z885 Allergy status to narcotic agent status: Secondary | ICD-10-CM | POA: Diagnosis not present

## 2014-06-03 DIAGNOSIS — Q211 Atrial septal defect: Secondary | ICD-10-CM

## 2014-06-03 DIAGNOSIS — K529 Noninfective gastroenteritis and colitis, unspecified: Secondary | ICD-10-CM | POA: Insufficient documentation

## 2014-06-03 DIAGNOSIS — Z883 Allergy status to other anti-infective agents status: Secondary | ICD-10-CM

## 2014-06-03 DIAGNOSIS — Z881 Allergy status to other antibiotic agents status: Secondary | ICD-10-CM | POA: Diagnosis not present

## 2014-06-03 DIAGNOSIS — Z79899 Other long term (current) drug therapy: Secondary | ICD-10-CM

## 2014-06-03 HISTORY — DX: Atrial septal defect, unspecified: Q21.10

## 2014-06-03 HISTORY — DX: Irritable bowel syndrome, unspecified: K58.9

## 2014-06-03 HISTORY — DX: Atrial septal defect: Q21.1

## 2014-06-03 LAB — COMPREHENSIVE METABOLIC PANEL
ALBUMIN: 3.8 g/dL (ref 3.5–5.0)
ALK PHOS: 63 U/L (ref 38–126)
ALT: 16 U/L (ref 14–54)
ANION GAP: 9 (ref 5–15)
AST: 24 U/L (ref 15–41)
BUN: 13 mg/dL (ref 6–20)
CHLORIDE: 106 mmol/L (ref 101–111)
CO2: 22 mmol/L (ref 22–32)
CREATININE: 0.71 mg/dL (ref 0.44–1.00)
Calcium: 8.5 mg/dL — ABNORMAL LOW (ref 8.9–10.3)
GFR calc Af Amer: >60 mL/min (ref >60)
Glucose, Bld: 125 mg/dL — ABNORMAL HIGH (ref 65–99)
POTASSIUM: 3.6 mmol/L (ref 3.5–5.1)
Sodium: 137 mmol/L (ref 135–145)
Total Bilirubin: 1 mg/dL (ref 0.3–1.2)
Total Protein: 6.6 g/dL (ref 6.5–8.1)

## 2014-06-03 LAB — CBC WITH DIFFERENTIAL/PLATELET
Basophils Absolute: 0 10*3/uL (ref 0–0.1)
EOS ABS: 0.1 10*3/uL (ref 0–0.7)
Eosinophils Relative: 1 %
HCT: 37.5 % (ref 35.0–47.0)
Hemoglobin: 12.7 g/dL (ref 12.0–16.0)
Lymphocytes Relative: 7 %
Lymphs Abs: 0.5 10*3/uL — ABNORMAL LOW (ref 1.0–3.6)
MCH: 27.1 pg (ref 26.0–34.0)
MCHC: 33.8 g/dL (ref 32.0–36.0)
MCV: 80.3 fL (ref 80.0–100.0)
Monocytes Absolute: 0.7 10*3/uL (ref 0.2–0.9)
Neutro Abs: 6.5 10*3/uL (ref 1.4–6.5)
PLATELETS: 174 10*3/uL (ref 150–440)
RBC: 4.66 MIL/uL (ref 3.80–5.20)
RDW: 14.6 % — ABNORMAL HIGH (ref 11.5–14.5)
WBC: 7.7 10*3/uL (ref 3.6–11.0)

## 2014-06-03 LAB — LIPASE, BLOOD: Lipase: 22 U/L (ref 22–51)

## 2014-06-03 MED ORDER — CIPROFLOXACIN HCL 500 MG PO TABS
500.0000 mg | ORAL_TABLET | Freq: Once | ORAL | Status: AC
  Administered 2014-06-03: 500 mg via ORAL

## 2014-06-03 MED ORDER — PROMETHAZINE HCL 25 MG PO TABS: 12.5000 mg | ORAL_TABLET | Freq: Four times a day (QID) | ORAL | Status: DC | PRN

## 2014-06-03 MED ORDER — ESTRADIOL 1 MG PO TABS
0.5000 mg | ORAL_TABLET | Freq: Every day | ORAL | Status: DC
  Administered 2014-06-04: 0.5 mg via ORAL
  Filled 2014-06-03 (×2): qty 0.5

## 2014-06-03 MED ORDER — ALBUTEROL SULFATE (2.5 MG/3ML) 0.083% IN NEBU: 2.5000 mg | INHALATION_SOLUTION | Freq: Four times a day (QID) | RESPIRATORY_TRACT | Status: DC | PRN

## 2014-06-03 MED ORDER — IPRATROPIUM-ALBUTEROL 18-103 MCG/ACT IN AERO: 1.0000 | INHALATION_SPRAY | Freq: Four times a day (QID) | RESPIRATORY_TRACT | Status: DC | PRN

## 2014-06-03 MED ORDER — ACETAMINOPHEN 325 MG PO TABS
650.0000 mg | ORAL_TABLET | Freq: Four times a day (QID) | ORAL | Status: DC | PRN
  Administered 2014-06-03: 650 mg via ORAL
  Filled 2014-06-03: qty 2

## 2014-06-03 MED ORDER — ACETAMINOPHEN 500 MG PO TABS
1000.0000 mg | ORAL_TABLET | Freq: Once | ORAL | Status: AC
  Administered 2014-06-03: 1000 mg via ORAL

## 2014-06-03 MED ORDER — HEPARIN SODIUM (PORCINE) 5000 UNIT/ML IJ SOLN
5000.0000 [IU] | Freq: Three times a day (TID) | INTRAMUSCULAR | Status: DC
  Filled 2014-06-03 (×3): qty 1

## 2014-06-03 MED ORDER — ACETAMINOPHEN 650 MG RE SUPP: 650.0000 mg | Freq: Four times a day (QID) | RECTAL | Status: DC | PRN

## 2014-06-03 MED ORDER — IPRATROPIUM-ALBUTEROL 0.5-2.5 (3) MG/3ML IN SOLN: 3.0000 mL | RESPIRATORY_TRACT | Status: DC | PRN

## 2014-06-03 MED ORDER — SODIUM CHLORIDE 0.9 % IV SOLN
INTRAVENOUS | Status: DC
  Administered 2014-06-03 – 2014-06-04 (×2): via INTRAVENOUS

## 2014-06-03 MED ORDER — METRONIDAZOLE 500 MG PO TABS
500.0000 mg | ORAL_TABLET | Freq: Once | ORAL | Status: AC
  Administered 2014-06-03: 500 mg via ORAL

## 2014-06-03 MED ORDER — GABAPENTIN 300 MG PO CAPS
300.0000 mg | ORAL_CAPSULE | Freq: Three times a day (TID) | ORAL | Status: DC
  Administered 2014-06-03 – 2014-06-04 (×2): 300 mg via ORAL
  Filled 2014-06-03 (×2): qty 1

## 2014-06-03 MED ORDER — PROMETHAZINE HCL 25 MG/ML IJ SOLN: 25.0000 mg | Freq: Four times a day (QID) | INTRAMUSCULAR | Status: DC | PRN

## 2014-06-03 MED ORDER — DICLOFENAC SODIUM 75 MG PO TBEC
75.0000 mg | DELAYED_RELEASE_TABLET | Freq: Two times a day (BID) | ORAL | Status: DC
  Administered 2014-06-03 – 2014-06-04 (×2): 75 mg via ORAL
  Filled 2014-06-03 (×3): qty 1

## 2014-06-03 MED ORDER — CITALOPRAM HYDROBROMIDE 10 MG PO TABS
10.0000 mg | ORAL_TABLET | Freq: Every day | ORAL | Status: DC
  Administered 2014-06-04: 10 mg via ORAL
  Filled 2014-06-03 (×2): qty 1

## 2014-06-03 MED ORDER — SODIUM CHLORIDE 0.9 % IV SOLN
3.0000 g | Freq: Four times a day (QID) | INTRAVENOUS | Status: DC
  Administered 2014-06-03 – 2014-06-04 (×3): 3 g via INTRAVENOUS
  Filled 2014-06-03 (×7): qty 3

## 2014-06-03 MED ORDER — SODIUM CHLORIDE 0.9 % IV SOLN
1000.0000 mL | Freq: Once | INTRAVENOUS | Status: AC
  Administered 2014-06-03: 1000 mL via INTRAVENOUS

## 2014-06-03 MED ORDER — METRONIDAZOLE 500 MG PO TABS
ORAL_TABLET | ORAL | Status: AC
  Administered 2014-06-03: 500 mg via ORAL
  Filled 2014-06-03: qty 1

## 2014-06-03 MED ORDER — LOPERAMIDE HCL 2 MG PO CAPS
ORAL_CAPSULE | ORAL | Status: AC
  Filled 2014-06-03: qty 1

## 2014-06-03 MED ORDER — CIPROFLOXACIN HCL 500 MG PO TABS
ORAL_TABLET | ORAL | Status: AC
  Administered 2014-06-03: 500 mg via ORAL
  Filled 2014-06-03: qty 1

## 2014-06-03 MED ORDER — CYCLOBENZAPRINE HCL 10 MG PO TABS
5.0000 mg | ORAL_TABLET | Freq: Three times a day (TID) | ORAL | Status: DC
  Administered 2014-06-03: via ORAL
  Administered 2014-06-04: 5 mg via ORAL
  Filled 2014-06-03 (×2): qty 1

## 2014-06-03 MED ORDER — IOHEXOL 240 MG/ML SOLN: 25.0000 mL | Freq: Once | INTRAMUSCULAR | Status: AC | PRN

## 2014-06-03 MED ORDER — PROMETHAZINE HCL 25 MG PO TABS
ORAL_TABLET | ORAL | Status: AC
  Administered 2014-06-03: 25 mg via ORAL
  Filled 2014-06-03: qty 1

## 2014-06-03 MED ORDER — ACETAMINOPHEN 500 MG PO TABS
ORAL_TABLET | ORAL | Status: AC
  Administered 2014-06-03: 1000 mg via ORAL
  Filled 2014-06-03: qty 2

## 2014-06-03 MED ORDER — IOHEXOL 300 MG/ML  SOLN
100.0000 mL | Freq: Once | INTRAMUSCULAR | Status: AC | PRN
  Administered 2014-06-03: 100 mL via INTRAVENOUS

## 2014-06-03 MED ORDER — PROMETHAZINE HCL 25 MG PO TABS
25.0000 mg | ORAL_TABLET | ORAL | Status: AC
Start: 2014-06-03 — End: 2014-06-03
  Administered 2014-06-03: 25 mg via ORAL

## 2014-06-03 MED ORDER — LOPERAMIDE HCL 2 MG PO CAPS
2.0000 mg | ORAL_CAPSULE | Freq: Once | ORAL | Status: AC
  Administered 2014-06-03: 2 mg via ORAL

## 2014-06-03 MED ORDER — FLUTICASONE PROPIONATE 50 MCG/ACT NA SUSP
2.0000 | Freq: Every day | NASAL | Status: DC | PRN
  Filled 2014-06-03: qty 16

## 2014-06-03 MED ORDER — LORATADINE 10 MG PO TABS
10.0000 mg | ORAL_TABLET | Freq: Every day | ORAL | Status: DC
  Administered 2014-06-04: 10 mg via ORAL
  Filled 2014-06-03: qty 1

## 2014-06-03 MED ORDER — PANTOPRAZOLE SODIUM 40 MG PO TBEC
40.0000 mg | DELAYED_RELEASE_TABLET | Freq: Two times a day (BID) | ORAL | Status: DC
  Administered 2014-06-03 – 2014-06-04 (×2): 40 mg via ORAL
  Filled 2014-06-03 (×2): qty 1

## 2014-06-03 MED ORDER — ALBUTEROL SULFATE HFA 108 (90 BASE) MCG/ACT IN AERS: 1.0000 | INHALATION_SPRAY | Freq: Four times a day (QID) | RESPIRATORY_TRACT | Status: DC | PRN

## 2014-06-03 NOTE — ED Notes (Signed)
Patient returned from CT

## 2014-06-03 NOTE — Progress Notes (Signed)
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

## 2014-06-03 NOTE — ED Provider Notes (Signed)
[facility] Emergency Department Provider Note    ____________________________________________  Time seen: 9 AM  I have reviewed the triage vital signs and the nursing notes.   HISTORY  Chief Complaint Abdominal Pain   Patient reports that she's been having abdominal pain and low-grade fever and diarrhea off and on for approximately 2-3 days.  HPI Erin Richards is a 49 y.o. female with abdominal pain.  Patient states 2-3 days of off and on diarrhea with low-grade fever. She's been taking Imodium which has provided some relief with the diarrhea, however still had 1 loose bowel movement this morning and reports that she had a temperature that was "low-grade "last night.  Pain is described as a achy crampy pain associated with her diarrhea. She does note that Tylenol has provided some relief, however she also notes ongoing pain in her left rib cage after a fall 2 weeks ago.  Patient states that nothing relieves her symptoms aside from some Imodium and Tylenol. She cannot take other pain medicines because of sensitivity. She does state she can take Phenergan for nausea, however has not had any nausea or vomiting associated with this.  Patient denies any new medications or recent surgeries. She has been diagnosed with IBS that was possibly related to previous medication she was taking that had similar symptoms.    Past Medical History  Diagnosis Date  . IBS (irritable bowel syndrome)     There are no active problems to display for this patient.   Past Surgical History  Procedure Laterality Date  . Cardiac surgery    . Abdominal hysterectomy    DATE OF DISCHARGE: 09/23/2013  DISCHARGE DIAGNOSES:  1. Abdominal pain, gallbladder wall thickening, HIDA scan done.  2. Hematochezia likely hemorrhoids.  3. Hematemesis started on protective medications by gastrointestinal. Follow-up in clinic. No work-up suggested by gastrointestinal in this  admission.  4. Depression.  5. Asthma.   No current outpatient prescriptions on file.  Allergies Review of patient's allergies indicates not on file.   Patient has an extensive allergy list, however she is able to have Phenergan if needed for nausea, Tylenol, and Imodium. She is allergic to all opioid type pain medications. And is also intolerant of NSAIDs.  No family history on file.  Social History History  Substance Use Topics  . Smoking status: Never Smoker   . Smokeless tobacco: Not on file  . Alcohol Use: No    Review of Systems  Constitutional: Low-grade fever and generalized fatigue.  Eyes: Negative for visual changes. ENT: Negative for sore throat. Cardiovascular: Negative for chest pain. Respiratory: Negative for shortness of breath, still has persistent achy pain over the left side of the chest after a fall 2 weeks ago which she had x-rays done which were normal. Gastrointestinal: See history of present illness Genitourinary: Negative for dysuria. Musculoskeletal: Negative for back pain. Skin: Negative for rash. Neurological: Negative for headaches, focal weakness or numbness.  10-point ROS otherwise negative.  ____________________________________________   PHYSICAL EXAM:  VITAL SIGNS: ED Triage Vitals  Enc Vitals Group     BP 06/03/14 0817 99/54 mmHg     Pulse Rate 06/03/14 0817 104     Resp 06/03/14 0817 20     Temp 06/03/14 0817 98.3 F (36.8 C)     Temp Source 06/03/14 0817 Oral     SpO2 06/03/14 0817 95 %     Weight 06/03/14 0817 140 lb (63.504 kg)     Height 06/03/14 0817 4'  9" (1.448 m)     Head Cir --      Peak Flow --      Pain Score --      Pain Loc --      Pain Edu? --      Excl. in [place]? --      Constitutional: Alert and oriented. Well appearing and in no distress. Fatigued appearance Eyes: Conjunctivae are normal. PERRL. Normal extraocular movements. ENT   Head: Normocephalic and atraumatic.   Nose: No  congestion/rhinnorhea.   Mouth/Throat: Slightly dry membranes are moist.   Neck: No stridor. Hematological/Lymphatic/Immunilogical: No cervical lymphadenopathy. Cardiovascular: Normal rate, regular rhythm. Normal and symmetric distal pulses are present in all extremities. No murmurs, rubs, or gallops. Respiratory: Normal respiratory effort without tachypnea nor retractions. Breath sounds are clear and equal bilaterally. No wheezes/rales/rhonchi. Gastrointestinal: Soft and with nonfocal tenderness throughout the abdomen. There is no rebound or guarding, however patient does report that she feels moderate to severe pain to palpation. I am not able to identify any one area of the abdomen that is most tender however. There is no CVA tenderness. Genitourinary: Deferred Musculoskeletal: Nontender with normal range of motion in all extremities. No joint effusions.   Right lower leg:  No tenderness or edema.   Left lower leg:  No tenderness or edema. Neurologic:  Normal speech and language. No gross focal neurologic deficits are appreciated. Speech is normal. No gait instability. Skin:  Skin is warm, dry and intact. No rash noted. Psychiatric: Mood and affect are normal. Speech and behavior are normal. Patient exhibits appropriate insight and judgment.  _____________________________    RADIOLOGY  CT  Infectious inflammatory colitis involving the left colon, extending from the mid transverse colon to the rectum.  No drainable fluid collection/abscess. No free air.    ____________________________________________   PROCEDURES  Procedure(s) performed: None  Critical Care performed: No  ____________________________________________   INITIAL IMPRESSION / ASSESSMENT AND PLAN / ED COURSE  Pertinent labs & imaging results that were available during my care of the patient were reviewed by me and considered in my medical decision making (see chart for details).  .Time stamp  patient presents with concerns of loose stools, which she reports are nonbloody and not black. She associates a low-grade fever. In addition she has ongoing tenderness over the left ribs, however lungs are clear and there is no evidence of trauma to exam. At present it seems reasonable to obtain CT imaging of the abdomen and pelvis given reports of low-grade fever, and diarrhea. This could be indicative of colitis, diverticulitis, or potential other causes including cholecystitis, etc. Patient does not have focal tenderness to exam and a negative Murphy's at the bedside.  Discussed with patient pain medication that she is amenable to taking Tylenol, Imodium, and was offered Phenergan but does not report nausea this time. We'll hydrate with IV fluids and reassess after labs and CT imaging.  ____________________________________________  . Results for orders placed or performed during the hospital encounter of 06/03/14  CBC WITH DIFFERENTIAL  Result Value Ref Range   WBC 7.7 3.6 - 11.0 K/uL   RBC 4.66 3.80 - 5.20 MIL/uL   Hemoglobin 12.7 12.0 - 16.0 g/dL   HCT 37.5 35.0 - 47.0 %   MCV 80.3 80.0 - 100.0 fL   MCH 27.1 26.0 - 34.0 pg   MCHC 33.8 32.0 - 36.0 g/dL   RDW 14.6 (H) 11.5 - 14.5 %   Platelets 174 150 - 440 K/uL  Neutrophils Relative % 83% %   Neutro Abs 6.5 1.4 - 6.5 K/uL   Lymphocytes Relative 7% %   Lymphs Abs 0.5 (L) 1.0 - 3.6 K/uL   Monocytes Relative 9% %   Monocytes Absolute 0.7 0.2 - 0.9 K/uL   Eosinophils Relative 1% %   Eosinophils Absolute 0.1 0 - 0.7 K/uL   Basophils Relative 0% %   Basophils Absolute 0.0 0 - 0.1 K/uL  Comprehensive metabolic panel  Result Value Ref Range   Sodium 137 135 - 145 mmol/L   Potassium 3.6 3.5 - 5.1 mmol/L   Chloride 106 101 - 111 mmol/L   CO2 22 22 - 32 mmol/L   Glucose, Bld 125 (H) 65 - 99 mg/dL   BUN 13 6 - 20 mg/dL   Creatinine, Ser 0.71 0.44 - 1.00 mg/dL   Calcium 8.5 (L) 8.9 - 10.3 mg/dL   Total Protein 6.6 6.5 - 8.1 g/dL    Albumin 3.8 3.5 - 5.0 g/dL   AST 24 15 - 41 U/L   ALT 16 14 - 54 U/L   Alkaline Phosphatase 63 38 - 126 U/L   Total Bilirubin 1.0 0.3 - 1.2 mg/dL   GFR calc non Af Amer >60 >60 mL/min   GFR calc Af Amer >60 >60 mL/min   Anion gap 9 5 - 15  Lipase, blood  Result Value Ref Range   Lipase 22 22 - 51 U/L   Insert times to have patient reports she feels improved after hydration at this time. Patient is awake alert, patient appears to be in no extremist or distress. Labs are reviewed and do not demonstrate any elevated white count at this time and patient remains afebrile.  Discussed with the patient her various medication interactions and all appear to cause mild nausea and occasional vomiting. Given her symptoms and CT findings I think treatment with antibiotics for the next 7 days is a reasonable decision. Discussed with the patient and she is amenable to treatment with ciprofloxacin and Flagyl for 7 days and addition we'll add Phenergan. Patient states the tenderness or preferred nausea medicine.  Patient in addition reports she has never had any sort of anaphylactic type reaction to any medication and that she just has multiple sensitivities.  Plan to discharge the patient to have her follow-up with her primary care doctor this week, in addition I recommend that she set up follow-up with GI Dr. Rayann Heman as soon as available.  Treatment recommendations and return precautions discussed the patient and her husband are quite agreeable with plan of care.  FINAL CLINICAL IMPRESSION(S) / ED DIAGNOSES  Final diagnoses:  Colitis   ----------------------------------------- 4:25 PM on 06/03/2014 -----------------------------------------  Patient unable to tolerate by mouth at this time, continues to note nausea. Due to patient's multiple medication allergies, selection of a anti-emetic and antibiotics is challenging. Patient does not have any evidence of acute allergic reaction after receiving  antibiotics or Phenergan. Administer additional Phenergan at this time. Admit for additional IV fluids antibiotics and ongoing care. Patient unable to tolerate by mouth.  Delman Kitten, MD 06/03/14 [id number]

## 2014-06-03 NOTE — ED Notes (Signed)
Documented by B. Kenton Kingfisher, Therapist, sports. Pt presents with c/o abd pain since 12 am today. Reports nausea and diarrhea. Denies vomiting. Reports being evaluated in ER for painful ribs 2 weeks.

## 2014-06-03 NOTE — ED Notes (Signed)
Patient reports fevers and diarrhea x2 days. Reports abdominal pain since last night.

## 2014-06-03 NOTE — ED Notes (Signed)
Patient transported to CT 

## 2014-06-03 NOTE — ED Notes (Signed)
Pt refuses to finish drinking oral CT contrast. CT and MD notified.

## 2014-06-03 NOTE — ED Notes (Signed)
Per Dr. Jacqualine Code, administer ordered medications. Due to pt's extensive hx of medication allergies, monitor pt for 30-40 minutes after administering medications.

## 2014-06-03 NOTE — ED Notes (Signed)
Pt resting in bed. No distress noted. Family at bedside.

## 2014-06-03 NOTE — ED Notes (Signed)
Admitting MD at bedside.

## 2014-06-03 NOTE — H&P (Signed)
Erin Richards, is a 49 y.o. female  MRN: [id number]   DOB - 02-May-1965  Admit Date - 06/03/2014  Outpatient Primary MD for the patient is Margarita Rana, MD   With History of -  Past Medical History  Diagnosis Date  . IBS (irritable bowel syndrome)   . Residual ASD (atrial septal defect) following repair       Past Surgical History  Procedure Laterality Date  . Cardiac surgery    . Abdominal hysterectomy      in for   Chief Complaint  Patient presents with  . Abdominal Pain     HPI  Erin Richards  is a 49 y.o. female  with abdominal pain.  Patient states 2-3 days of off and on diarrhea with low-grade fever. She's been taking Imodium which has provided some relief with the diarrhea, however still had 1 loose bowel movement this morning and reports that she had a temperature that was "high grade "last night fever of 103.4 at home  Pain is described as a achy crampy pain associated with her diarrhea. She does note that Tylenol has provided some relief, however she also notes ongoing pain in her left rib cage after a fall 2 weeks ago.   She cannot take other pain medicines because of sensitivity. She does state she can take Phenergan for nausea, however has not had any nausea or vomiting associated with this.  Patient denies any new medications or recent surgeries. She has been diagnosed with IBS   She received some po abx and she threw it up  Review of Systems    In addition to the HPI above Positive for Fever-chills, No Headache, No changes with Vision or hearing, No problems swallowing food or Liquids, No Chest pain, Cough or Shortness of Breath, Severe gen Abdominal pain, with Nausea or Vomitting, diarrhea+ -No Blood in stool or Urine, No dysuria, No new skin rashes or bruises, No new joints  pains-aches,  Positive for gen weakness, tingling, numbness in any extremity, No recent weight gain or loss, No polyuria, polydypsia or polyphagia, No significant Mental Stressors.  A full 10 point Review of Systems was done, except as stated above, all other Review of Systems were negative.   Social History History  Substance Use Topics  . Smoking status: Never Smoker   . Smokeless tobacco: Not on file  . Alcohol Use: No     Family History History reviewed. No pertinent family history.  Prior to Admission medications   Medication Sig Start Date End Date Taking? Authorizing Provider  albuterol (PROVENTIL HFA;VENTOLIN HFA) 108 (90 BASE) MCG/ACT inhaler Inhale 1-2 puffs into the lungs every 6 (six) hours as needed for shortness of breath.   Yes Historical Provider, MD  albuterol-ipratropium (COMBIVENT) 18-103 MCG/ACT inhaler Inhale 1 puff into the lungs 4 (four) times daily as needed for shortness of breath.   Yes Historical Provider, MD  cetirizine (ZYRTEC) 10 MG tablet Take 10 mg by mouth  daily.   Yes Historical Provider, MD  citalopram (CELEXA) 20 MG tablet Take 10 mg by mouth daily.   Yes Historical Provider, MD  cyclobenzaprine (FLEXERIL) 5 MG tablet Take 5 mg by mouth 3 (three) times daily.   Yes Historical Provider, MD  diclofenac (VOLTAREN) 75 MG EC tablet Take 75 mg by mouth 2 (two) times daily.   Yes Historical Provider, MD  dicyclomine (BENTYL) 20 MG tablet Take 20 mg by mouth 4 (four) times daily as needed for spasms.   Yes Historical Provider, MD  estradiol (ESTRACE) 0.5 MG tablet Take 0.5 mg by mouth daily.   Yes Historical Provider, MD  fluticasone (FLONASE) 50 MCG/ACT nasal spray Place 2 sprays into both nostrils daily as needed for allergies or rhinitis.   Yes Historical Provider, MD  gabapentin (NEURONTIN) 300 MG capsule Take 300 mg by mouth 3 (three) times daily.   Yes Historical Provider, MD  pantoprazole (PROTONIX) 40 MG tablet Take 40 mg by mouth 2 (two) times  daily.   Yes Historical Provider, MD    Allergies  Allergen Reactions  . Acetaminophen-Codeine Nausea And Vomiting  . Aspartame And Phenylalanine Nausea And Vomiting  . Biaxin [Clarithromycin] Nausea And Vomiting  . Chlorhexidine Gluconate Nausea And Vomiting  . Clindamycin/Lincomycin Nausea And Vomiting  . Dilaudid [Hydromorphone Hcl] Nausea And Vomiting  . Doxycycline Nausea And Vomiting  . Fentanyl Nausea And Vomiting  . Hydrocodone Nausea And Vomiting  . Mefenamic Acid Nausea And Vomiting  . Metformin And Related Nausea And Vomiting  . Morphine And Related Nausea And Vomiting  . Nitrofurantoin Nausea And Vomiting  . Oxycodone-Acetaminophen Nausea And Vomiting  . Periguard [Dimethicone] Nausea And Vomiting  . Phenothiazines Nausea And Vomiting  . Pioglitazone Nausea And Vomiting  . Quinidine Nausea And Vomiting  . Quinine Derivatives Nausea And Vomiting  . Quinolones Nausea And Vomiting  . Tetracyclines & Related Nausea And Vomiting  . Toradol [Ketorolac Tromethamine] Nausea And Vomiting  . Tramadol Nausea And Vomiting  . Tussin [Guaifenesin] Nausea And Vomiting  . Tussionex Pennkinetic Er [Hydrocod Polst-Cpm Polst Er] Nausea And Vomiting    Physical Exam  Vitals  Blood pressure 95/62, pulse 89, temperature 98.3 F (36.8 C), temperature source Oral, resp. rate 18, height 4\' 9"  (1.448 m), weight 63.504 kg (140 lb), SpO2 99 %.   1. General  lying in bed in  Acute distress with abdominal pain 2. Normal affect and insight, Not Suicidal or Homicidal, Awake Alert, Oriented X 3.  3. No focal Neuro deficits, ALL C.Nerves Intact, Strength 5/5 all 4 extremities, Sensation intact all 4 extremities, Plantars down going.  4. Ears and Eyes appear Normal, Conjunctivae clear, PERRLA. Moist Oral Mucosa.  5. Supple Neck, No JVD, No cervical lymphadenopathy appriciated, No Carotid Bruits.  6. Symmetrical Chest wall movement, Good air movement bilaterally, CTAB.  7. RRR, No Gallops,  Rubs or Murmurs, No Parasternal Heave.  8. Positive Bowel Sounds, Abdomen Soft, gen tenderness, No organomegaly appreciated,No rebound -guarding or rigidity.  9.  No Cyanosis, Normal Skin Turgor, No Skin Rash or Bruise.  10. Good muscle tone,  joints appear normal , no effusions, Normal ROM.  11. No Palpable Lymph Nodes in Neck or Axillae  CBC  Recent Labs Lab 06/03/14 0840  WBC 7.7  HGB 12.7  HCT 37.5  PLT 174  MCV 80.3  MCH 27.1  MCHC 33.8  RDW 14.6*  LYMPHSABS 0.5*  MONOABS 0.7  EOSABS 0.1  BASOSABS 0.0   ------------------------------------------------------------------------------------------------------------------  Chemistries  Recent Labs Lab 06/03/14 0840  NA 137  K 3.6  CL 106  CO2 22  GLUCOSE 125*  BUN 13  CREATININE 0.71  CALCIUM 8.5*  AST 24  ALT 16  ALKPHOS 63  BILITOT 1.0   Cardiac Enzymes No results for input(s): CKMB, TROPONINI, MYOGLOBIN in the last 168 hours.  Invalid input(s): CK ------------------------------------------------------------------------------------------------------------------ Invalid input(s): POCBNP   ---------------------------------------------------------------------------------------------------------------  Urinalysis No results found for: COLORURINE, APPEARANCEUR, LABSPEC, PHURINE, GLUCOSEU, HGBUR, BILIRUBINUR, KETONESUR, PROTEINUR, UROBILINOGEN, NITRITE, LEUKOCYTESUR  ----------------------------------------------------------------------------------------------------------------  Imaging results:   Ct Abdomen Pelvis W Contrast  06/03/2014   CLINICAL DATA:  Umbilical pain, diarrhea, fever. History of IBS. Status post hysterectomy.  EXAM: CT ABDOMEN AND PELVIS WITH CONTRAST  TECHNIQUE: Multidetector CT imaging of the abdomen and pelvis was performed using the standard protocol following bolus administration of intravenous contrast.  CONTRAST:  140mL OMNIPAQUE IOHEXOL 300 MG/ML  SOLN  COMPARISON:  10/02/2013   FINDINGS: Lower chest:  Mild left basilar atelectasis.  Hepatobiliary: Liver is within normal limits. No suspicious/ enhancing hepatic lesions.  Distended gallbladder. No associated inflammatory changes by CT. No intrahepatic or extrahepatic ductal dilatation.  Pancreas: Within normal limits.  Spleen: Within normal limits.  Adrenals/Urinary Tract: Adrenal glands are within normal limits.  Kidneys within normal limits.  No hydronephrosis.  Bladder is within normal limits.  Stomach/Bowel: Stomach is within normal limits.  No evidence of bowel obstruction.  Normal appendix.  Wall thickening involving the left colon, from the mid transverse colon to the rectum, most prominently involving the descending colon (series 2/ image 39). This appearance is suspicious for infectious/ inflammatory colitis, possibly ulcerative colitis.  Vascular/Lymphatic: No evidence of abdominal aortic aneurysm.  No suspicious abdominopelvic lymphadenopathy.  Reproductive: Status post hysterectomy.  No adnexal masses.  Other: No abdominopelvic ascites.  No drainable fluid collection/abscess.  No free air.  Musculoskeletal: Visualized osseous structures are within normal limits.  IMPRESSION: Infectious inflammatory colitis involving the left colon, extending from the mid transverse colon to the rectum.  No drainable fluid collection/abscess.  No free air.   Electronically Signed   By: Julian Hy M.D.   On: 06/03/2014 13:18     Assessment & Plan  1.  Acute colitis, mid tranverse/left descending colon -acute onset for 2 days with diarrhea and severe abdominal pain. Some bloody stoools -fever-103.4 at home -IV unasyn (confirmed wih pt-not allergic) -prn po diclofenac and po tylenol (since allergic to all other pain meds) -GI consult if symptoms worsen -?IV steroids  2.Vomting suspected due to #1 -prn phenergan  3. H/o IBS  4.DVT proph -Heparin tid SQ  Family Communication: Admission, patients condition and plan of care  including tests being ordered have been discussed with the patient and husband who indicate understanding and agree with the plan and Code Status.  Code Status full Time spent in minutes :45 mins  Buena Boehm M.D on 06/03/2014 at 4:53 PM  [facility]  Office  [phone]

## 2014-06-03 NOTE — ED Notes (Signed)
Pt c/o nausea and reports vomiting x 1. Small amt of emesis noted. Dr. Jacqualine Code notified.

## 2014-06-04 MED ORDER — AMOXICILLIN-POT CLAVULANATE 875-125 MG PO TABS: 1.0000 | ORAL_TABLET | Freq: Two times a day (BID) | ORAL | Status: DC

## 2014-06-04 NOTE — Progress Notes (Signed)
Patient ID: Erin Richards, female   DOB: 1966/02/03, 49 y.o.   MRN: [id number] Patient Demographics  Erin Richards, is a 49 y.o. female, DOB - [birth year], VEL:[id number]  Admit date - 06/03/2014   Admitting Physician Fritzi Mandes, MD  Outpatient Primary MD for the patient is Margarita Rana, MD    Chief Complaint  Patient presents with  . Abdominal Pain        Subjective:   [name] today has, No headache, No chest pain, No abdominal pain - No Nausea, No new weakness tingling or numbness, No Cough - SOB. Presented with abdominal pain, nausea, vomiting, feels good, wants to go home, wants det to be advanced  Objective:   Filed Vitals:   06/03/14 1748 06/03/14 1801 06/04/14 0009 06/04/14 0804  BP: 97/47 108/48 103/45 94/43  Pulse: 83 84 92 74  Temp:  98 F (36.7 C) 98.6 F (37 C) 98.2 F (36.8 C)  TempSrc:  Oral Oral Oral  Resp: 20 18 18 18   Height:      Weight:      SpO2: 95% 100% 100% 95%    Wt Readings from Last 3 Encounters:  06/03/14 63.504 kg (140 lb)     Intake/Output Summary (Last 24 hours) at 06/04/14 1049 Last data filed at 06/04/14 0830  Gross per 24 hour  Intake   1421 ml  Output    400 ml  Net   1021 ml    ROS:  CONSTITUTIONAL: No documented fever. No fatigue and weakness. No weight gain or weight loss.  EYES: No blurry or double vision.  EARS, NOSE, THROAT: No tinnitus. No postnasal drip. No redness of the oropharynx.  RESPIRATORY:  No cough. No wheeze. No hemoptysis. No dyspnea.  CARDIOVASCULAR: No chest pain. No orthopnea. No peripheral edema. No dyspnea on exertion. No palpitation or syncope.  GASTROINTESTINAL: No nausea, no vomiting, no diarrhea. No abdominal pain. No melena or hematochezia.  GENITOURINARY: No dysuria or hematuria.  ENDOCRINE: No polyuria, nocturia, heat or cold intolerance.   HEMATOLOGIC:  No anemia, no bruising, no bleeding.  INTEGUMENTARY: No rashes. No lesions.  MUSCULOSKELETAL: No arthritis, no swelling, no gout.   NEUROLOGIC: No numbness or tingling. No ataxia. No seizure-type activity.  PSYCHIATRIC: No anxiety, no insomnia. No ADD.    Physical Exam   Gen - Awake Alert, Oriented X 3 HEENT - AT, Kimberly,PERRL, moist oral mucosa.   Neck - Supple Neck,No JVD, No cervical lymphadenopathy appriciated.  Lung - Symmetrical Chest wall movement, Good air movement bilaterally, CTAB Heart - RRR,No Gallops,Rubs or new Murmurs, No Parasternal Heave Abg - +ve B.Sounds, Abd Soft, No tenderness, No organomegaly appriciated, No rebound - guarding or rigidity. Ext - No Cyanosis, Clubbing or edema, No new Rash or bruise  Neuro - AAO X 3, no focal motor or sensory defecits b/l  Data Review   Micro Results No results found for this or any previous visit (from the past 240 hour(s)).  Radiology Reports Ct Abdomen Pelvis W Contrast  06/03/2014   CLINICAL DATA:  Umbilical pain, diarrhea, fever. History of IBS. Status post hysterectomy.  EXAM: CT ABDOMEN AND PELVIS WITH CONTRAST  TECHNIQUE: Multidetector CT imaging of the abdomen and pelvis was performed using the standard protocol following bolus administration of intravenous contrast.  CONTRAST:  152mL OMNIPAQUE IOHEXOL 300 MG/ML  SOLN  COMPARISON:  10/02/2013  FINDINGS: Lower chest:  Mild left basilar atelectasis.  Hepatobiliary: Liver is within normal limits. No suspicious/ enhancing hepatic lesions.  Distended  gallbladder. No associated inflammatory changes by CT. No intrahepatic or extrahepatic ductal dilatation.  Pancreas: Within normal limits.  Spleen: Within normal limits.  Adrenals/Urinary Tract: Adrenal glands are within normal limits.  Kidneys within normal limits.  No hydronephrosis.  Bladder is within normal limits.  Stomach/Bowel: Stomach is within normal limits.  No evidence of bowel obstruction.  Normal appendix.  Wall thickening involving the left colon, from the mid transverse colon to the rectum, most prominently involving the descending colon (series 2/ image  39). This appearance is suspicious for infectious/ inflammatory colitis, possibly ulcerative colitis.  Vascular/Lymphatic: No evidence of abdominal aortic aneurysm.  No suspicious abdominopelvic lymphadenopathy.  Reproductive: Status post hysterectomy.  No adnexal masses.  Other: No abdominopelvic ascites.  No drainable fluid collection/abscess.  No free air.  Musculoskeletal: Visualized osseous structures are within normal limits.  IMPRESSION: Infectious inflammatory colitis involving the left colon, extending from the mid transverse colon to the rectum.  No drainable fluid collection/abscess.  No free air.   Electronically Signed   By: Julian Hy M.D.   On: 06/03/2014 13:18    CBC  Recent Labs Lab 06/03/14 0840  WBC 7.7  HGB 12.7  HCT 37.5  PLT 174  MCV 80.3  MCH 27.1  MCHC 33.8  RDW 14.6*  LYMPHSABS 0.5*  MONOABS 0.7  EOSABS 0.1  BASOSABS 0.0    Chemistries   Recent Labs Lab 06/03/14 0840  NA 137  K 3.6  CL 106  CO2 22  GLUCOSE 125*  BUN 13  CREATININE 0.71  CALCIUM 8.5*  AST 24  ALT 16  ALKPHOS 63  BILITOT 1.0   ------------------------------------------------------------------------------------------------------------------ estimated creatinine clearance is 67.4 mL/min (by C-G formula based on Cr of 0.71). ------------------------------------------------------------------------------------------------------------------ No results for input(s): HGBA1C in the last 72 hours. ------------------------------------------------------------------------------------------------------------------ No results for input(s): CHOL, HDL, LDLCALC, TRIG, CHOLHDL, LDLDIRECT in the last 72 hours. ------------------------------------------------------------------------------------------------------------------ No results for input(s): TSH, T4TOTAL, T3FREE, THYROIDAB in the last 72 hours.  Invalid input(s):  FREET3 ------------------------------------------------------------------------------------------------------------------ No results for input(s): VITAMINB12, FOLATE, FERRITIN, TIBC, IRON, RETICCTPCT in the last 72 hours.  Coagulation profile No results for input(s): INR, PROTIME in the last 168 hours.  No results for input(s): DDIMER in the last 72 hours.  Cardiac Enzymes No results for input(s): CKMB, TROPONINI, MYOGLOBIN in the last 168 hours.  Invalid input(s): CK ------------------------------------------------------------------------------------------------------------------ Invalid input(s): POCBNP       Assessment & Plan   Active Problems:   Acute colitis    1. Acute colitis of unclear ethology, improved on Ab therapy, continue Augmentin, follow up with GI as outpt 2. IBD with diarrhea, resolved now, continue home meds 3. GERD without esophagitis, PPI 4. Nausea and vomiting, resolved now, recommended slow progression to regular consistency diet     Medications  Scheduled Meds: . ampicillin-sulbactam (UNASYN) IV  3 g Intravenous Q6H  . citalopram  10 mg Oral Daily  . cyclobenzaprine  5 mg Oral TID  . diclofenac  75 mg Oral BID  . estradiol  0.5 mg Oral Daily  . gabapentin  300 mg Oral TID  . heparin  5,000 Units Subcutaneous 3 times per day  . loratadine  10 mg Oral Daily  . pantoprazole  40 mg Oral BID   Continuous Infusions: . sodium chloride 100 mL/hr at 06/04/14 0830   PRN Meds:.acetaminophen **OR** acetaminophen, albuterol, fluticasone, ipratropium-albuterol, promethazine, promethazine   DVT Prophylaxis  Heparin, ambulatory   Code Status: full  Family Communication: at the bedside, face to face  Disposition Plan: home   Time Spent in minutes   39    Doc Mandala M.D on 06/04/2014 at 10:49 AM  [facility]  [phone]

## 2014-06-04 NOTE — Discharge Instructions (Addendum)

## 2014-06-04 NOTE — Discharge Summary (Signed)
Erin Richards, is a 49 y.o. female  DOB 1965/03/21  MRN [id number].  Admission date:  06/03/2014  Admitting Physician  Erin Mandes, MD  Discharge Date:  06/04/2014   Primary MD  Erin Rana, MD  Recommendations for primary care physician for things to follow:   Cbc, bmp in 1 week. Patient would benefit from GI evaluation as outpatient   Admission Diagnosis  Colitis [K52.9]   Discharge Diagnosis  Colitis [K52.9]  IBD with diarrhea,  GERD without esophagitis, nausea, vomiting, anemia  Active Problems:   Acute colitis      Past Medical History  Diagnosis Date  . IBS (irritable bowel syndrome)   . Residual ASD (atrial septal defect) following repair     Past Surgical History  Procedure Laterality Date  . Cardiac surgery    . Abdominal hysterectomy         History of present illness and  Hospital Course:     Kindly see H&P for history of present illness and admission details, please review complete Labs, Consult reports and Test reports for all details in brief  HPI  from the history and physical done on the day of admission    Hospital Course    1. Acute colitis of unclear ethology, improved on Ab therapy, continue Augmentin for 10 days, follow up with GI as outpt 2. IBD with diarrhea, resolved now, continue home meds 3. GERD without esophagitis, PPI, no complaints 4. Nausea and vomiting, resolved now, recommended slow progression to regular consistency diet   Discharge Condition: good   Follow UP  Follow-up Information    Schedule an appointment as soon as possible for a visit with Erin Rana, MD.   Specialty:  Family Medicine   Contact information:   [place] [address] [address] [phone]       Follow up with Erin Cheers, MD. Schedule an  appointment as soon as possible for a visit in 1 week.   Specialty:  Gastroenterology   Contact information:   [place] [address] [phone]         Discharge Instructions  and  Discharge Medications   Please advance diet to regular slowly as tolerated.    Medication List    STOP taking these medications        diclofenac 75 MG EC tablet  Commonly known as:  VOLTAREN      TAKE these medications  albuterol 108 (90 BASE) MCG/ACT inhaler  Commonly known as:  PROVENTIL HFA;VENTOLIN HFA  Inhale 1-2 puffs into the lungs every 6 (six) hours as needed for shortness of breath.     albuterol-ipratropium 18-103 MCG/ACT inhaler  Commonly known as:  COMBIVENT  Inhale 1 puff into the lungs 4 (four) times daily as needed for shortness of breath.     amoxicillin-clavulanate 875-125 MG per tablet  Commonly known as:  AUGMENTIN  Take 1 tablet by mouth 2 (two) times daily.     cetirizine 10 MG tablet  Commonly known as:  ZYRTEC  Take 10 mg by mouth daily.     citalopram 20 MG tablet  Commonly known as:  CELEXA  Take 10 mg by mouth daily.     cyclobenzaprine 5 MG tablet  Commonly known as:  FLEXERIL  Take 5 mg by mouth 3 (three) times daily.     dicyclomine 20 MG tablet  Commonly known as:  BENTYL  Take 20 mg by mouth 4 (four) times daily as needed for spasms.     estradiol 0.5 MG tablet  Commonly known as:  ESTRACE  Take 0.5 mg by mouth daily.     fluticasone 50 MCG/ACT nasal spray  Commonly known as:  FLONASE  Place 2 sprays into both nostrils daily as needed for allergies or rhinitis.     gabapentin 300 MG capsule  Commonly known as:  NEURONTIN  Take 300 mg by mouth 3 (three) times daily.     pantoprazole 40 MG tablet  Commonly known as:  PROTONIX  Take 40 mg by mouth 2 (two) times daily.          Diet and Activity recommendation: See Discharge Instructions above   Consults obtained -  none   Major procedures and Radiology Reports - PLEASE review detailed and final reports for all details, in brief -   none   Ct Abdomen Pelvis W Contrast  06/03/2014   CLINICAL DATA:  Umbilical pain, diarrhea, fever. History of IBS. Status post hysterectomy.  EXAM: CT ABDOMEN AND PELVIS WITH CONTRAST  TECHNIQUE: Multidetector CT imaging of the abdomen and pelvis was performed using the standard protocol following bolus administration of intravenous contrast.  CONTRAST:  139mL OMNIPAQUE IOHEXOL 300 MG/ML  SOLN  COMPARISON:  10/02/2013  FINDINGS: Lower chest:  Mild left basilar atelectasis.  Hepatobiliary: Liver is within normal limits. No suspicious/ enhancing hepatic lesions.  Distended gallbladder. No associated inflammatory changes by CT. No intrahepatic or extrahepatic ductal dilatation.  Pancreas: Within normal limits.  Spleen: Within normal limits.  Adrenals/Urinary Tract: Adrenal glands are within normal limits.  Kidneys within normal limits.  No hydronephrosis.  Bladder is within normal limits.  Stomach/Bowel: Stomach is within normal limits.  No evidence of bowel obstruction.  Normal appendix.  Wall thickening involving the left colon, from the mid transverse colon to the rectum, most prominently involving the descending colon (series 2/ image 39). This appearance is suspicious for infectious/ inflammatory colitis, possibly ulcerative colitis.  Vascular/Lymphatic: No evidence of abdominal aortic aneurysm.  No suspicious abdominopelvic lymphadenopathy.  Reproductive: Status post hysterectomy.  No adnexal masses.  Other: No abdominopelvic ascites.  No drainable fluid collection/abscess.  No free air.  Musculoskeletal: Visualized osseous structures are within normal limits.  IMPRESSION: Infectious inflammatory colitis involving the left colon, extending from the mid transverse colon to the rectum.  No drainable fluid collection/abscess.  No free air.   Electronically Signed   By: Henderson Newcomer.D.  On: 06/03/2014 13:18    Micro Results   none  No results found for this or any previous visit (from the past 240 hour(s)).     Today   Subjective:   Erin Richards today has no headache,no chest abdominal pain,no new weakness tingling or numbness, feels much better wants to go home today. Presented with abdominal pain, nausea, vomiting, feels good, wants to go home, wants det to be advanced  Objective:   Blood pressure 94/43, pulse 74, temperature 98.2 F (36.8 C), temperature source Oral, resp. rate 18, height 4\' 9"  (1.448 m), weight 63.504 kg (140 lb), SpO2 95 %.   Intake/Output Summary (Last 24 hours) at 06/04/14 1102 Last data filed at 06/04/14 0830  Gross per 24 hour  Intake   1421 ml  Output    400 ml  Net   1021 ml    Exam Awake Alert, Oriented x 3, No new F.N deficits, Normal affect Wilson.AT,PERRAL Supple Neck,No JVD, No cervical lymphadenopathy appriciated.  Symmetrical Chest wall movement, Good air movement bilaterally, CTAB RRR,No Gallops,Rubs or new Murmurs, No Parasternal Heave +ve B.Sounds, Abd Soft, Non tender, No organomegaly appriciated, No rebound -guarding or rigidity. No Cyanosis, Clubbing or edema, No new Rash or bruise  Data Review   CBC w Diff: Lab Results  Component Value Date   WBC 7.7 06/03/2014   WBC 5.4 10/02/2013   HGB 12.7 06/03/2014   HGB 11.7* 10/02/2013   HCT 37.5 06/03/2014   HCT 35.6 10/02/2013   PLT 174 06/03/2014   PLT 149* 10/02/2013   LYMPHOPCT 7% 06/03/2014   LYMPHOPCT 19.2 10/02/2013   MONOPCT 9% 06/03/2014   MONOPCT 10.2 10/02/2013   EOSPCT 1% 06/03/2014   EOSPCT 1.7 10/02/2013   BASOPCT 0% 06/03/2014   BASOPCT 0.9 10/02/2013    CMP: Lab Results  Component Value Date   NA 137 06/03/2014   NA 142 10/02/2013   K 3.6 06/03/2014   K 4.3 10/02/2013   CL 106 06/03/2014   CL 110* 10/02/2013   CO2 22 06/03/2014   CO2 27 10/02/2013   BUN 13 06/03/2014   BUN 7 10/02/2013   CREATININE 0.71 06/03/2014   CREATININE  0.85 10/02/2013   PROT 6.6 06/03/2014   PROT 6.1* 10/02/2013   ALBUMIN 3.8 06/03/2014   ALBUMIN 3.6 10/02/2013   BILITOT 1.0 06/03/2014   ALKPHOS 63 06/03/2014   ALKPHOS 59 10/02/2013   AST 24 06/03/2014   AST 16 10/02/2013   ALT 16 06/03/2014   ALT 27 10/02/2013  .   Total Time in preparing paper work, data evaluation and todays exam - 72 minutes  Kiahna Banghart M.D on 06/04/2014 at 11:02 AM

## 2014-06-22 ENCOUNTER — Encounter: Payer: Self-pay | Admitting: Emergency Medicine

## 2014-06-22 ENCOUNTER — Emergency Department
Admission: EM | Admit: 2014-06-22 | Discharge: 2014-06-22 | Disposition: A | Discharge status: Home / Self Care | Payer: Medicaid Other | Attending: Emergency Medicine | Admitting: Emergency Medicine

## 2014-06-22 DIAGNOSIS — R1030 Lower abdominal pain, unspecified: Secondary | ICD-10-CM | POA: Diagnosis present

## 2014-06-22 DIAGNOSIS — K529 Noninfective gastroenteritis and colitis, unspecified: Secondary | ICD-10-CM | POA: Insufficient documentation

## 2014-06-22 DIAGNOSIS — F419 Anxiety disorder, unspecified: Secondary | ICD-10-CM | POA: Diagnosis not present

## 2014-06-22 DIAGNOSIS — Z79899 Other long term (current) drug therapy: Secondary | ICD-10-CM | POA: Insufficient documentation

## 2014-06-22 LAB — CBC WITH DIFFERENTIAL/PLATELET
BASOS ABS: 0 10*3/uL (ref 0–0.1)
BASOS PCT: 1 %
EOS PCT: 1 %
Eosinophils Absolute: 0 10*3/uL (ref 0–0.7)
HCT: 38.5 % (ref 35.0–47.0)
Hemoglobin: 12.8 g/dL (ref 12.0–16.0)
Lymphocytes Relative: 24 %
Lymphs Abs: 0.9 10*3/uL — ABNORMAL LOW (ref 1.0–3.6)
MCH: 26.7 pg (ref 26.0–34.0)
MCHC: 33.3 g/dL (ref 32.0–36.0)
MCV: 80 fL (ref 80.0–100.0)
MONO ABS: 0.3 10*3/uL (ref 0.2–0.9)
Monocytes Relative: 9 %
Neutro Abs: 2.5 10*3/uL (ref 1.4–6.5)
Neutrophils Relative %: 65 %
Platelets: 149 10*3/uL — ABNORMAL LOW (ref 150–440)
RBC: 4.82 MIL/uL (ref 3.80–5.20)
RDW: 14.7 % — AB (ref 11.5–14.5)
WBC: 3.8 10*3/uL (ref 3.6–11.0)

## 2014-06-22 LAB — COMPREHENSIVE METABOLIC PANEL
ALK PHOS: 63 U/L (ref 38–126)
ALT: 10 U/L — ABNORMAL LOW (ref 14–54)
AST: 14 U/L — ABNORMAL LOW (ref 15–41)
Albumin: 4.2 g/dL (ref 3.5–5.0)
Anion gap: 5 (ref 5–15)
BUN: 9 mg/dL (ref 6–20)
CHLORIDE: 107 mmol/L (ref 101–111)
CO2: 30 mmol/L (ref 22–32)
CREATININE: 0.6 mg/dL (ref 0.44–1.00)
Calcium: 8.9 mg/dL (ref 8.9–10.3)
GFR calc non Af Amer: >60 mL/min (ref >60)
GLUCOSE: 89 mg/dL (ref 65–99)
POTASSIUM: 3.3 mmol/L — AB (ref 3.5–5.1)
Sodium: 142 mmol/L (ref 135–145)
Total Bilirubin: 0.9 mg/dL (ref 0.3–1.2)
Total Protein: 7.1 g/dL (ref 6.5–8.1)

## 2014-06-22 LAB — URINALYSIS COMPLETE WITH MICROSCOPIC (ARMC ONLY)
BACTERIA UA: NONE SEEN
Bilirubin Urine: NEGATIVE
Glucose, UA: NEGATIVE mg/dL
Leukocytes, UA: NEGATIVE
NITRITE: NEGATIVE
Protein, ur: NEGATIVE mg/dL
SPECIFIC GRAVITY, URINE: 1.02 (ref 1.005–1.030)
pH: 5 (ref 5.0–8.0)

## 2014-06-22 LAB — LIPASE, BLOOD: LIPASE: 33 U/L (ref 22–51)

## 2014-06-22 MED ORDER — FLUCONAZOLE 150 MG PO TABS: 150.0000 mg | ORAL_TABLET | ORAL | Status: DC

## 2014-06-22 MED ORDER — AMOXICILLIN-POT CLAVULANATE 875-125 MG PO TABS: 1.0000 | ORAL_TABLET | Freq: Two times a day (BID) | ORAL | Status: DC

## 2014-06-22 NOTE — ED Notes (Signed)
Patient to ED with c/o LLQ abdominal pain, patient reports being diagnosed with colitis and placed on Augmentin earlier in month. Reports completed Augmentin on Sunday and now symptoms have returned. Patient also reports some constipation with blood in bowels.

## 2014-06-22 NOTE — ED Provider Notes (Signed)
[facility] Emergency Department Provider Note  ____________________________________________  Time seen: 2 PM  I have reviewed the triage vital signs and the nursing notes.   HISTORY  Chief Complaint Abdominal Pain    HPI Erin Richards is a 49 y.o. female who presents with lower abdominal discomfort. She reports the pain is moderate and crampy in nature. It feels similar to a recent bout of colitis that brought her into the hospital. She denies fevers chills. No nausea vomiting. No dysuria. She reports normal stools. After her hospitalization she had been feeling well but the pain started again 2 days ago after her antibiotics ran out.     Past Medical History  Diagnosis Date  . IBS (irritable bowel syndrome)   . Residual ASD (atrial septal defect) following repair     Patient Active Problem List   Diagnosis Date Noted  . Acute colitis 06/03/2014    Past Surgical History  Procedure Laterality Date  . Cardiac surgery    . Abdominal hysterectomy      Current Outpatient Rx  Name  Route  Sig  Dispense  Refill  . albuterol (PROVENTIL HFA;VENTOLIN HFA) 108 (90 BASE) MCG/ACT inhaler   Inhalation   Inhale 1-2 puffs into the lungs every 6 (six) hours as needed for shortness of breath.         Marland Kitchen albuterol-ipratropium (COMBIVENT) 18-103 MCG/ACT inhaler   Inhalation   Inhale 1 puff into the lungs 4 (four) times daily as needed for shortness of breath.         Marland Kitchen amoxicillin-clavulanate (AUGMENTIN) 875-125 MG per tablet   Oral   Take 1 tablet by mouth 2 (two) times daily.   20 tablet   0   . cetirizine (ZYRTEC) 10 MG tablet   Oral   Take 10 mg by mouth daily.         . citalopram (CELEXA) 20 MG tablet   Oral   Take 10 mg by mouth daily.         . cyclobenzaprine (FLEXERIL) 5 MG tablet   Oral   Take 5 mg by mouth 3 (three) times daily.         Marland Kitchen dicyclomine (BENTYL) 20 MG tablet   Oral   Take 20 mg by mouth 4 (four) times  daily as needed for spasms.         Marland Kitchen estradiol (ESTRACE) 0.5 MG tablet   Oral   Take 0.5 mg by mouth daily.         . fluticasone (FLONASE) 50 MCG/ACT nasal spray   Each Nare   Place 2 sprays into both nostrils daily as needed for allergies or rhinitis.         Marland Kitchen gabapentin (NEURONTIN) 300 MG capsule   Oral   Take 300 mg by mouth 3 (three) times daily.         . pantoprazole (PROTONIX) 40 MG tablet   Oral   Take 40 mg by mouth 2 (two) times daily.           Allergies Acetaminophen-codeine; Aspartame and phenylalanine; Biaxin; Chlorhexidine gluconate; Clindamycin/lincomycin; Dilaudid; Doxycycline; Fentanyl; Hydrocodone; Mefenamic acid; Metformin and related; Morphine and related; Nitrofurantoin; Oxycodone-acetaminophen; Periguard; Phenothiazines; Pioglitazone; Quinidine; Quinine derivatives; Quinolones; Tetracyclines & related; Toradol; Tramadol; Tussin; and Tussionex pennkinetic er  History reviewed. No pertinent family history.  Social History History  Substance Use Topics  . Smoking status: Never Smoker   . Smokeless tobacco: Not on file  . Alcohol Use:  No    Review of Systems  Constitutional: Negative for fever. Eyes: Negative for visual changes. ENT: Negative for sore throat. Cardiovascular: Negative for chest pain. Respiratory: Negative for shortness of breath. Gastrointestinal: Positive for abdominal pain Genitourinary: Negative for dysuria. Musculoskeletal: Negative for back pain. Skin: Negative for rash. Neurological: Negative for headaches, focal weakness or numbness. Psychiatric: Positive anxiety  10-point ROS otherwise negative.  ____________________________________________   PHYSICAL EXAM:  VITAL SIGNS: ED Triage Vitals  Enc Vitals Group     BP 06/22/14 1101 103/55 mmHg     Pulse Rate 06/22/14 1101 61     Resp 06/22/14 1101 22     Temp 06/22/14 1101 97.7 F (36.5 C)     Temp Source 06/22/14 1101 Oral     SpO2 06/22/14 1101 100 %      Weight 06/22/14 1101 138 lb (62.596 kg)     Height 06/22/14 1101 4\' 9"  (1.448 m)     Head Cir --      Peak Flow --      Pain Score 06/22/14 1102 10     Pain Loc --      Pain Edu? --      Excl. in [place]? --      Constitutional: Alert and oriented. Well appearing and in no distress. Eyes: Conjunctivae are normal. PERRL. Normal extraocular movements. ENT   Head: Normocephalic and atraumatic.   Nose: No congestion/rhinnorhea.   Mouth/Throat: Mucous membranes are moist.   Neck: No stridor. Hematological/Lymphatic/Immunilogical: No cervical lymphadenopathy. Cardiovascular: Normal rate, regular rhythm. Normal and symmetric distal pulses are present in all extremities. No murmurs, rubs, or gallops. Respiratory: Normal respiratory effort without tachypnea nor retractions. Breath sounds are clear and equal bilaterally. No wheezes/rales/rhonchi. Gastrointestinal: Soft and nontender. No distention. There is no CVA tenderness. Genitourinary: deferred Musculoskeletal: Nontender with normal range of motion in all extremities. No joint effusions.  No lower extremity tenderness nor edema. Neurologic:  Normal speech and language. No gross focal neurologic deficits are appreciated. Speech is normal.  Skin:  Skin is warm, dry and intact. No rash noted. Psychiatric: Mood and affect are normal. Speech and behavior are normal. Patient exhibits appropriate insight and judgment.  ____________________________________________    LABS (pertinent positives/negatives)  Mild hypokalemia, otherwise normal  ____________________________________________   EKG  None  ____________________________________________    RADIOLOGY  None  ____________________________________________   PROCEDURES  Procedure(s) performed: None  Critical Care performed: None    ____________________________________________   INITIAL IMPRESSION / ASSESSMENT AND PLAN / ED COURSE  Pertinent labs & imaging  results that were available during my care of the patient were reviewed by me and considered in my medical decision making (see chart for details).  Patient well-appearing. Very mild tenderness in the left lower quadrant. Labs reassuring. Suspect recurrence of colitis. Feel that she will be okay with outpatient treatment. Patient given return cautioned  ____________________________________________   FINAL CLINICAL IMPRESSION(S) / ED DIAGNOSES  Final diagnoses:  Colitis     Lavonia Drafts, MD 06/22/14 1504

## 2014-06-22 NOTE — ED Notes (Signed)
Pt states has some blood in her stool.

## 2014-06-22 NOTE — ED Notes (Signed)
MD at bedside. 

## 2014-06-22 NOTE — Discharge Instructions (Signed)

## 2014-08-13 ENCOUNTER — Other Ambulatory Visit: Payer: Self-pay

## 2014-08-13 MED ORDER — DICYCLOMINE HCL 20 MG PO TABS: 20.0000 mg | ORAL_TABLET | Freq: Four times a day (QID) | ORAL | Status: DC | PRN

## 2014-08-13 NOTE — Telephone Encounter (Signed)
Received electronic refill request via fax from [facility] in [place] at this time for Dicyclomine 20mg  - 1 tab 30 minutes prior to meals and at bedtime up to 4 times daily as needed for diarrhea.  Patient last seen in 10/2013.  Will send in 2 refills and patient will need appointment with Dr. Allen Norris for any further refills.  Called patient to give this information. Pharmacy was verified. Pt will call back in August to make September appointment to avoid running out of medication.  She verbalizes understanding of this.

## 2014-08-16 ENCOUNTER — Encounter
Admission: RE | Disposition: A | Discharge status: Home / Self Care | Payer: Self-pay | Source: Ambulatory Visit | Attending: Unknown Physician Specialty

## 2014-08-16 ENCOUNTER — Ambulatory Visit: Payer: Medicaid Other | Admitting: Anesthesiology

## 2014-08-16 ENCOUNTER — Ambulatory Visit
Admission: RE | Admit: 2014-08-16 | Discharge: 2014-08-16 | Disposition: A | Discharge status: Home / Self Care | Payer: Medicaid Other | Source: Ambulatory Visit | Attending: Unknown Physician Specialty | Admitting: Unknown Physician Specialty

## 2014-08-16 DIAGNOSIS — K2950 Unspecified chronic gastritis without bleeding: Secondary | ICD-10-CM | POA: Diagnosis not present

## 2014-08-16 DIAGNOSIS — R1084 Generalized abdominal pain: Secondary | ICD-10-CM | POA: Diagnosis present

## 2014-08-16 DIAGNOSIS — K5190 Ulcerative colitis, unspecified, without complications: Secondary | ICD-10-CM | POA: Insufficient documentation

## 2014-08-16 DIAGNOSIS — Z79899 Other long term (current) drug therapy: Secondary | ICD-10-CM | POA: Insufficient documentation

## 2014-08-16 DIAGNOSIS — Q211 Atrial septal defect: Secondary | ICD-10-CM | POA: Diagnosis not present

## 2014-08-16 DIAGNOSIS — D123 Benign neoplasm of transverse colon: Secondary | ICD-10-CM | POA: Insufficient documentation

## 2014-08-16 DIAGNOSIS — K2270 Barrett's esophagus without dysplasia: Secondary | ICD-10-CM | POA: Insufficient documentation

## 2014-08-16 DIAGNOSIS — K625 Hemorrhage of anus and rectum: Secondary | ICD-10-CM | POA: Diagnosis present

## 2014-08-16 HISTORY — PX: ESOPHAGOGASTRODUODENOSCOPY (EGD) WITH PROPOFOL: SHX5813

## 2014-08-16 HISTORY — PX: COLONOSCOPY WITH PROPOFOL: SHX5780

## 2014-08-16 SURGERY — COLONOSCOPY WITH PROPOFOL
Anesthesia: General

## 2014-08-16 MED ORDER — PROPOFOL 10 MG/ML IV BOLUS
INTRAVENOUS | Status: DC | PRN
  Administered 2014-08-16: 40 mg via INTRAVENOUS

## 2014-08-16 MED ORDER — MIDAZOLAM HCL 5 MG/5ML IJ SOLN
INTRAMUSCULAR | Status: DC | PRN
  Administered 2014-08-16: 1 mg via INTRAVENOUS

## 2014-08-16 MED ORDER — ONDANSETRON HCL 4 MG/2ML IJ SOLN
INTRAMUSCULAR | Status: DC | PRN
Start: 2014-08-16 — End: 2014-08-16
  Administered 2014-08-16: 4 mg via INTRAVENOUS

## 2014-08-16 MED ORDER — SODIUM CHLORIDE 0.9 % IV SOLN: INTRAVENOUS | Status: DC

## 2014-08-16 MED ORDER — PROPOFOL INFUSION 10 MG/ML OPTIME
INTRAVENOUS | Status: DC | PRN
  Administered 2014-08-16: 120 ug/kg/min via INTRAVENOUS

## 2014-08-16 MED ORDER — LIDOCAINE HCL (PF) 1 % IJ SOLN
INTRAMUSCULAR | Status: AC
  Administered 2014-08-16: 2 mL via INTRAVENOUS
  Filled 2014-08-16: qty 2

## 2014-08-16 MED ORDER — SODIUM CHLORIDE 0.9 % IV SOLN
INTRAVENOUS | Status: DC
  Administered 2014-08-16: 1000 mL via INTRAVENOUS
  Administered 2014-08-16: 15:00:00 via INTRAVENOUS

## 2014-08-16 MED ORDER — PIPERACILLIN-TAZOBACTAM 3.375 G IVPB 30 MIN
3.3750 g | INTRAVENOUS | Status: AC
  Administered 2014-08-16: 3.375 g via INTRAVENOUS
  Filled 2014-08-16: qty 50

## 2014-08-16 NOTE — Op Note (Signed)
[facility] Patient Name: Erin Richards Procedure Date: 08/16/2014 1:53 PM MRN: [id number] Account #: [id number] Date of Birth: 04/01/1965 Admit Type: Outpatient Age: 49 Room: [facility] ENDO ROOM 4 Gender: Female Note Status: Finalized Procedure:         Upper GI endoscopy Indications:       Generalized abdominal pain Providers:         Manya Silvas, MD Referring MD:      Jerrell Belfast, MD (Referring MD) Medicines:         Propofol per Anesthesia Complications:     No immediate complications. Procedure:         Pre-Anesthesia Assessment:                    - After reviewing the risks and benefits, the patient was                     deemed in satisfactory condition to undergo the procedure.                    After obtaining informed consent, the endoscope was passed                     under direct vision. Throughout the procedure, the                     patient's blood pressure, pulse, and oxygen saturations                     were monitored continuously. The Olympus GIF-160 endoscope                     (S#. [id number]) was introduced through the mouth, and                     advanced to the second part of duodenum. The upper GI                     endoscopy was accomplished without difficulty. The patient                     tolerated the procedure well. Findings:      There were esophageal mucosal changes suspicious for short-segment       Barrett's esophagus present in the lower third of the esophagus. The       maximum longitudinal extent of these mucosal changes was 2 cm in length.       Mucosa was biopsied with a cold forceps for histology. One specimen       bottle was sent to pathology. GEJ 38cm.      Patchy minimal-mildly erythematous mucosa without bleeding was found in       the gastric antrum. Biopsies were taken with a cold forceps for       histology.      The examined duodenum was normal. Impression:        - Esophageal  mucosal changes suspicious for short-segment                     Barrett's esophagus. Biopsied.                    - Erythematous mucosa in the antrum. Biopsied.                    -  Normal examined duodenum. Recommendation:    - Await pathology results. Manya Silvas, MD 08/16/2014 2:16:59 PM This report has been signed electronically. Number of Addenda: 0 Note Initiated On: 08/16/2014 1:53 PM      [facility]

## 2014-08-16 NOTE — Transfer of Care (Signed)
Immediate Anesthesia Transfer of Care Note  Patient: Erin Richards  Procedure(s) Performed: Procedure(s): COLONOSCOPY WITH PROPOFOL (N/A) ESOPHAGOGASTRODUODENOSCOPY (EGD) WITH PROPOFOL  Patient Location: PACU  Anesthesia Type:General  Level of Consciousness: sedated  Airway & Oxygen Therapy: Patient Spontanous Breathing and Patient connected to nasal cannula oxygen  Post-op Assessment: Report given to RN and Post -op Vital signs reviewed and stable  Post vital signs: Reviewed and stable  Last Vitals:  Filed Vitals:   08/16/14 1448  BP: 85/37  Pulse:   Temp: 35.7 C  Resp: 16    Complications: No apparent anesthesia complications

## 2014-08-16 NOTE — H&P (Signed)
Primary Care Physician:  Margarita Rana, MD Primary Gastroenterologist:  Dr. Vira Agar  Pre-Procedure History & Physical: HPI:  Erin Richards is a 49 y.o. female is here for an endoscopy and colonoscopy.   Past Medical History  Diagnosis Date  . IBS (irritable bowel syndrome)   . Residual ASD (atrial septal defect) following repair     Past Surgical History  Procedure Laterality Date  . Cardiac surgery    . Abdominal hysterectomy      Prior to Admission medications   Medication Sig Start Date End Date Taking? Authorizing Provider  albuterol (PROVENTIL HFA;VENTOLIN HFA) 108 (90 BASE) MCG/ACT inhaler Inhale 1-2 puffs into the lungs every 6 (six) hours as needed for shortness of breath.   Yes Historical Provider, MD  albuterol-ipratropium (COMBIVENT) 18-103 MCG/ACT inhaler Inhale 1 puff into the lungs 4 (four) times daily as needed for shortness of breath.   Yes Historical Provider, MD  cetirizine (ZYRTEC) 10 MG tablet Take 10 mg by mouth daily.   Yes Historical Provider, MD  citalopram (CELEXA) 20 MG tablet Take 10 mg by mouth daily.   Yes Historical Provider, MD  cyclobenzaprine (FLEXERIL) 5 MG tablet Take 5 mg by mouth 3 (three) times daily.   Yes Historical Provider, MD  dicyclomine (BENTYL) 20 MG tablet Take 1 tablet (20 mg total) by mouth 4 (four) times daily as needed for spasms. 08/13/14  Yes Lucilla Lame, MD  estradiol (ESTRACE) 0.5 MG tablet Take 0.5 mg by mouth daily.   Yes Historical Provider, MD  fluticasone (FLONASE) 50 MCG/ACT nasal spray Place 2 sprays into both nostrils daily as needed for allergies or rhinitis.   Yes Historical Provider, MD  gabapentin (NEURONTIN) 300 MG capsule Take 300 mg by mouth 3 (three) times daily.   Yes Historical Provider, MD  pantoprazole (PROTONIX) 40 MG tablet Take 40 mg by mouth 2 (two) times daily.   Yes Historical Provider, MD  amoxicillin-clavulanate (AUGMENTIN) 875-125 MG per tablet Take 1 tablet by mouth 2 (two) times  daily. Patient not taking: Reported on 08/16/2014 06/22/14   Lavonia Drafts, MD  fluconazole (DIFLUCAN) 150 MG tablet Take 1 tablet (150 mg total) by mouth 3 days. Patient not taking: Reported on 08/16/2014 06/22/14   Lavonia Drafts, MD    Allergies as of 06/13/2014 - Review Complete 06/03/2014  Allergen Reaction Noted  . Acetaminophen-codeine Nausea And Vomiting 06/03/2014  . Aspartame and phenylalanine Nausea And Vomiting 06/03/2014  . Biaxin [clarithromycin] Nausea And Vomiting 06/03/2014  . Chlorhexidine gluconate Nausea And Vomiting 06/03/2014  . Clindamycin/lincomycin Nausea And Vomiting 06/03/2014  . Dilaudid [hydromorphone hcl] Nausea And Vomiting 06/03/2014  . Doxycycline Nausea And Vomiting 06/03/2014  . Fentanyl Nausea And Vomiting 06/03/2014  . Hydrocodone Nausea And Vomiting 06/03/2014  . Mefenamic acid Nausea And Vomiting 06/03/2014  . Metformin and related Nausea And Vomiting 06/03/2014  . Morphine and related Nausea And Vomiting 06/03/2014  . Nitrofurantoin Nausea And Vomiting 06/03/2014  . Oxycodone-acetaminophen Nausea And Vomiting 06/03/2014  . Periguard [dimethicone] Nausea And Vomiting 06/03/2014  . Phenothiazines Nausea And Vomiting 06/03/2014  . Pioglitazone Nausea And Vomiting 06/03/2014  . Quinidine Nausea And Vomiting 06/03/2014  . Quinine derivatives Nausea And Vomiting 06/03/2014  . Quinolones Nausea And Vomiting 06/03/2014  . Tetracyclines & related Nausea And Vomiting 06/03/2014  . Toradol [ketorolac tromethamine] Nausea And Vomiting 06/03/2014  . Tramadol Nausea And Vomiting 06/03/2014  . Tussin [guaifenesin] Nausea And Vomiting 06/03/2014  . Tussionex pennkinetic er [hydrocod polst-cpm polst er] Nausea And  Vomiting 06/03/2014    No family history on file.  History   Social History  . Marital Status: Divorced    Spouse Name: N/A  . Number of Children: N/A  . Years of Education: N/A   Occupational History  . Not on file.   Social History Main  Topics  . Smoking status: Never Smoker   . Smokeless tobacco: Not on file  . Alcohol Use: No  . Drug Use: Not on file  . Sexual Activity: Not on file   Other Topics Concern  . Not on file   Social History Narrative    Review of Systems: See HPI, otherwise negative ROS  Physical Exam: BP 96/47 mmHg  Pulse 58  Temp(Src) 97.7 F (36.5 C) (Tympanic)  Resp 20  Ht 4\' 9"  (1.448 m)  Wt 63.504 kg (140 lb)  BMI 30.29 kg/m2  SpO2 100% General:   Alert,  pleasant and cooperative in NAD Head:  Normocephalic and atraumatic. Neck:  Supple; no masses or thyromegaly. Lungs:  Clear throughout to auscultation.    Heart:  Regular rate and rhythm. Abdomen:  Soft, nontender and nondistended. Normal bowel sounds, without guarding, and without rebound.   Neurologic:  Alert and  oriented x4;  grossly normal neurologically.  Impression/Plan: Minerva Ends is here for an endoscopy and colonoscopy to be performed for abdominal pain generalized.  Risks, benefits, limitations, and alternatives regarding  endoscopy and colonoscopy have been reviewed with the patient.  Questions have been answered.  All parties agreeable.   Gaylyn Cheers, MD  08/16/2014, 1:58 PM   Primary Care Physician:  Margarita Rana, MD Primary Gastroenterologist:  Dr. Vira Agar  Pre-Procedure History & Physical: HPI:  Erin Richards is a 49 y.o. female is here for an endoscopy and colonoscopy.   Past Medical History  Diagnosis Date  . IBS (irritable bowel syndrome)   . Residual ASD (atrial septal defect) following repair     Past Surgical History  Procedure Laterality Date  . Cardiac surgery    . Abdominal hysterectomy      Prior to Admission medications   Medication Sig Start Date End Date Taking? Authorizing Provider  albuterol (PROVENTIL HFA;VENTOLIN HFA) 108 (90 BASE) MCG/ACT inhaler Inhale 1-2 puffs into the lungs every 6 (six) hours as needed for shortness of breath.   Yes Historical Provider, MD   albuterol-ipratropium (COMBIVENT) 18-103 MCG/ACT inhaler Inhale 1 puff into the lungs 4 (four) times daily as needed for shortness of breath.   Yes Historical Provider, MD  cetirizine (ZYRTEC) 10 MG tablet Take 10 mg by mouth daily.   Yes Historical Provider, MD  citalopram (CELEXA) 20 MG tablet Take 10 mg by mouth daily.   Yes Historical Provider, MD  cyclobenzaprine (FLEXERIL) 5 MG tablet Take 5 mg by mouth 3 (three) times daily.   Yes Historical Provider, MD  dicyclomine (BENTYL) 20 MG tablet Take 1 tablet (20 mg total) by mouth 4 (four) times daily as needed for spasms. 08/13/14  Yes Lucilla Lame, MD  estradiol (ESTRACE) 0.5 MG tablet Take 0.5 mg by mouth daily.   Yes Historical Provider, MD  fluticasone (FLONASE) 50 MCG/ACT nasal spray Place 2 sprays into both nostrils daily as needed for allergies or rhinitis.   Yes Historical Provider, MD  gabapentin (NEURONTIN) 300 MG capsule Take 300 mg by mouth 3 (three) times daily.   Yes Historical Provider, MD  pantoprazole (PROTONIX) 40 MG tablet Take 40 mg by mouth 2 (two) times daily.   Yes  Historical Provider, MD  amoxicillin-clavulanate (AUGMENTIN) 875-125 MG per tablet Take 1 tablet by mouth 2 (two) times daily. Patient not taking: Reported on 08/16/2014 06/22/14   Lavonia Drafts, MD  fluconazole (DIFLUCAN) 150 MG tablet Take 1 tablet (150 mg total) by mouth 3 days. Patient not taking: Reported on 08/16/2014 06/22/14   Lavonia Drafts, MD    Allergies as of 06/13/2014 - Review Complete 06/03/2014  Allergen Reaction Noted  . Acetaminophen-codeine Nausea And Vomiting 06/03/2014  . Aspartame and phenylalanine Nausea And Vomiting 06/03/2014  . Biaxin [clarithromycin] Nausea And Vomiting 06/03/2014  . Chlorhexidine gluconate Nausea And Vomiting 06/03/2014  . Clindamycin/lincomycin Nausea And Vomiting 06/03/2014  . Dilaudid [hydromorphone hcl] Nausea And Vomiting 06/03/2014  . Doxycycline Nausea And Vomiting 06/03/2014  . Fentanyl Nausea And Vomiting  06/03/2014  . Hydrocodone Nausea And Vomiting 06/03/2014  . Mefenamic acid Nausea And Vomiting 06/03/2014  . Metformin and related Nausea And Vomiting 06/03/2014  . Morphine and related Nausea And Vomiting 06/03/2014  . Nitrofurantoin Nausea And Vomiting 06/03/2014  . Oxycodone-acetaminophen Nausea And Vomiting 06/03/2014  . Periguard [dimethicone] Nausea And Vomiting 06/03/2014  . Phenothiazines Nausea And Vomiting 06/03/2014  . Pioglitazone Nausea And Vomiting 06/03/2014  . Quinidine Nausea And Vomiting 06/03/2014  . Quinine derivatives Nausea And Vomiting 06/03/2014  . Quinolones Nausea And Vomiting 06/03/2014  . Tetracyclines & related Nausea And Vomiting 06/03/2014  . Toradol [ketorolac tromethamine] Nausea And Vomiting 06/03/2014  . Tramadol Nausea And Vomiting 06/03/2014  . Tussin [guaifenesin] Nausea And Vomiting 06/03/2014  . Tussionex pennkinetic er [hydrocod polst-cpm polst er] Nausea And Vomiting 06/03/2014    No family history on file.  History   Social History  . Marital Status: Divorced    Spouse Name: N/A  . Number of Children: N/A  . Years of Education: N/A   Occupational History  . Not on file.   Social History Main Topics  . Smoking status: Never Smoker   . Smokeless tobacco: Not on file  . Alcohol Use: No  . Drug Use: Not on file  . Sexual Activity: Not on file   Other Topics Concern  . Not on file   Social History Narrative    Review of Systems: See HPI, otherwise negative ROS  Physical Exam: BP 96/47 mmHg  Pulse 58  Temp(Src) 97.7 F (36.5 C) (Tympanic)  Resp 20  Ht 4\' 9"  (1.448 m)  Wt 63.504 kg (140 lb)  BMI 30.29 kg/m2  SpO2 100% General:   Alert,  pleasant and cooperative in NAD Head:  Normocephalic and atraumatic. Neck:  Supple; no masses or thyromegaly. Lungs:  Clear throughout to auscultation.    Heart:  Regular rate and rhythm. Abdomen:  Soft, nontender and nondistended. Normal bowel sounds, without guarding, and without  rebound.   Neurologic:  Alert and  oriented x4;  grossly normal neurologically.  Impression/Plan: Minerva Ends is here for an endoscopy and colonoscopy to be performed for abdominal pain generalized.  Risks, benefits, limitations, and alternatives regarding  endoscopy and colonoscopy have been reviewed with the patient.  Questions have been answered.  All parties agreeable.   Gaylyn Cheers, MD  08/16/2014, 1:58 PM

## 2014-08-16 NOTE — Op Note (Signed)
[facility] Patient Name: Erin Richards Procedure Date: 08/16/2014 1:51 PM MRN: [id number] Account #: [id number] Date of Birth: 21-Dec-1965 Admit Type: Outpatient Age: 49 Room: [facility] ENDO ROOM 1 Gender: Female Note Status: Finalized Procedure:         Colonoscopy Indications:       Generalized abdominal pain, Rectal bleeding Providers:         Manya Silvas, MD Referring MD:      Jerrell Belfast, MD (Referring MD) Medicines:         Propofol per Anesthesia Complications:     No immediate complications. Procedure:         Pre-Anesthesia Assessment:                    - After reviewing the risks and benefits, the patient was                     deemed in satisfactory condition to undergo the procedure.                    After obtaining informed consent, the colonoscope was                     passed under direct vision. Throughout the procedure, the                     patient's blood pressure, pulse, and oxygen saturations                     were monitored continuously. The Colonoscope was                     introduced through the anus and advanced to the the cecum,                     identified by appendiceal orifice and ileocecal valve. The                     colonoscopy was performed without difficulty. The patient                     tolerated the procedure well. The quality of the bowel                     preparation was excellent. Findings:      Two sessile polyps were found in the transverse colon. The polyps were       diminutive in size. These polyps were removed with a jumbo cold forceps.       Resection and retrieval were complete.      Diffuse and patchy mild inflammation characterized by erythema,       granularity and loss of vascularity was found in the rectum and in the       recto-sigmoid colon. Biopsies were taken with a cold forceps for       histology.      Due to left sided colitis I biopsied the righttcolon and  transverse       colon and descending colon. Impression:        - Two diminutive polyps in the transverse colon. Resected                     and retrieved.                    -  Diffuse and patchy mild inflammation was found in the                     rectum and in the recto-sigmoid colon secondary to                     left-sided ulcerative colitis. Biopsied. Recommendation:    - Await pathology results. Manya Silvas, MD 08/16/2014 2:40:00 PM This report has been signed electronically. Number of Addenda: 0 Note Initiated On: 08/16/2014 1:51 PM Scope Withdrawal Time: 0 hours 11 minutes 48 seconds  Total Procedure Duration: 0 hours 16 minutes 19 seconds       [facility]

## 2014-08-16 NOTE — Anesthesia Preprocedure Evaluation (Signed)
Anesthesia Evaluation  Patient identified by MRN, date of birth, ID band Patient awake    Reviewed: Allergy & Precautions, NPO status , Patient's Chart, lab work & pertinent test results  History of Anesthesia Complications Negative for: history of anesthetic complications  Airway Mallampati: II  TM Distance: >3 FB Neck ROM: Full    Dental no notable dental hx.    Pulmonary neg pulmonary ROS,  breath sounds clear to auscultation  Pulmonary exam normal       Cardiovascular Exercise Tolerance: Poor Normal cardiovascular examRhythm:Regular Rate:Normal  ASD   Neuro/Psych negative neurological ROS  negative psych ROS   GI/Hepatic Neg liver ROS, IBS   Endo/Other  negative endocrine ROS  Renal/GU negative Renal ROS  negative genitourinary   Musculoskeletal negative musculoskeletal ROS (+)   Abdominal   Peds negative pediatric ROS (+)  Hematology negative hematology ROS (+)   Anesthesia Other Findings   Reproductive/Obstetrics negative OB ROS                             Anesthesia Physical Anesthesia Plan  ASA: II  Anesthesia Plan: General   Post-op Pain Management:    Induction: Intravenous  Airway Management Planned: Nasal Cannula  Additional Equipment:   Intra-op Plan:   Post-operative Plan:   Informed Consent: I have reviewed the patients History and Physical, chart, labs and discussed the procedure including the risks, benefits and alternatives for the proposed anesthesia with the patient or authorized representative who has indicated his/her understanding and acceptance.   Dental advisory given  Plan Discussed with: CRNA and Surgeon  Anesthesia Plan Comments:         Anesthesia Quick Evaluation

## 2014-08-16 NOTE — Anesthesia Postprocedure Evaluation (Signed)
  Anesthesia Post-op Note  Patient: Erin Richards  Procedure(s) Performed: Procedure(s): COLONOSCOPY WITH PROPOFOL (N/A) ESOPHAGOGASTRODUODENOSCOPY (EGD) WITH PROPOFOL  Anesthesia type:General  Patient location: PACU  Post pain: Pain level controlled  Post assessment: Post-op Vital signs reviewed, Patient's Cardiovascular Status Stable, Respiratory Function Stable, Patent Airway and No signs of Nausea or vomiting  Post vital signs: Reviewed and stable  Last Vitals:  Filed Vitals:   08/16/14 1448  BP: 85/37  Pulse:   Temp: 35.7 C  Resp: 16    Level of consciousness: awake, alert  and patient cooperative  Complications: No apparent anesthesia complications

## 2014-08-21 ENCOUNTER — Encounter: Payer: Self-pay | Admitting: Emergency Medicine

## 2014-08-21 ENCOUNTER — Telehealth: Payer: Self-pay | Admitting: Family Medicine

## 2014-08-21 ENCOUNTER — Encounter: Payer: Self-pay | Admitting: Family Medicine

## 2014-08-21 ENCOUNTER — Ambulatory Visit (INDEPENDENT_AMBULATORY_CARE_PROVIDER_SITE_OTHER): Payer: Medicaid Other | Admitting: Family Medicine

## 2014-08-21 ENCOUNTER — Emergency Department
Admission: EM | Admit: 2014-08-21 | Discharge: 2014-08-21 | Disposition: A | Discharge status: Home / Self Care | Payer: Medicaid Other | Attending: Emergency Medicine | Admitting: Emergency Medicine

## 2014-08-21 ENCOUNTER — Emergency Department: Payer: Medicaid Other

## 2014-08-21 VITALS — BP 90/48 | HR 100 | Temp 100.8°F | Resp 16 | Ht <= 58 in | Wt 142.0 lb

## 2014-08-21 DIAGNOSIS — F0781 Postconcussional syndrome: Secondary | ICD-10-CM | POA: Insufficient documentation

## 2014-08-21 DIAGNOSIS — Q256 Stenosis of pulmonary artery: Secondary | ICD-10-CM | POA: Insufficient documentation

## 2014-08-21 DIAGNOSIS — R531 Weakness: Secondary | ICD-10-CM | POA: Diagnosis present

## 2014-08-21 DIAGNOSIS — R197 Diarrhea, unspecified: Secondary | ICD-10-CM

## 2014-08-21 DIAGNOSIS — A047 Enterocolitis due to Clostridium difficile: Secondary | ICD-10-CM | POA: Insufficient documentation

## 2014-08-21 DIAGNOSIS — K582 Mixed irritable bowel syndrome: Secondary | ICD-10-CM | POA: Insufficient documentation

## 2014-08-21 DIAGNOSIS — Z7251 High risk heterosexual behavior: Secondary | ICD-10-CM | POA: Insufficient documentation

## 2014-08-21 DIAGNOSIS — M545 Low back pain, unspecified: Secondary | ICD-10-CM | POA: Insufficient documentation

## 2014-08-21 DIAGNOSIS — W57XXXA Bitten or stung by nonvenomous insect and other nonvenomous arthropods, initial encounter: Secondary | ICD-10-CM | POA: Insufficient documentation

## 2014-08-21 DIAGNOSIS — M4802 Spinal stenosis, cervical region: Secondary | ICD-10-CM | POA: Insufficient documentation

## 2014-08-21 DIAGNOSIS — G44309 Post-traumatic headache, unspecified, not intractable: Secondary | ICD-10-CM | POA: Insufficient documentation

## 2014-08-21 DIAGNOSIS — K648 Other hemorrhoids: Secondary | ICD-10-CM | POA: Insufficient documentation

## 2014-08-21 DIAGNOSIS — J189 Pneumonia, unspecified organism: Secondary | ICD-10-CM | POA: Insufficient documentation

## 2014-08-21 DIAGNOSIS — K120 Recurrent oral aphthae: Secondary | ICD-10-CM | POA: Insufficient documentation

## 2014-08-21 DIAGNOSIS — Q249 Congenital malformation of heart, unspecified: Secondary | ICD-10-CM

## 2014-08-21 DIAGNOSIS — R319 Hematuria, unspecified: Secondary | ICD-10-CM | POA: Insufficient documentation

## 2014-08-21 DIAGNOSIS — IMO0002 Reserved for concepts with insufficient information to code with codable children: Secondary | ICD-10-CM | POA: Insufficient documentation

## 2014-08-21 DIAGNOSIS — I440 Atrioventricular block, first degree: Secondary | ICD-10-CM | POA: Insufficient documentation

## 2014-08-21 DIAGNOSIS — N951 Menopausal and female climacteric states: Secondary | ICD-10-CM | POA: Insufficient documentation

## 2014-08-21 DIAGNOSIS — I4510 Unspecified right bundle-branch block: Secondary | ICD-10-CM | POA: Insufficient documentation

## 2014-08-21 DIAGNOSIS — K649 Unspecified hemorrhoids: Secondary | ICD-10-CM | POA: Insufficient documentation

## 2014-08-21 DIAGNOSIS — A0472 Enterocolitis due to Clostridium difficile, not specified as recurrent: Secondary | ICD-10-CM

## 2014-08-21 LAB — CBC
HCT: 39.5 % (ref 35.0–47.0)
HEMOGLOBIN: 13.3 g/dL (ref 12.0–16.0)
MCH: 26.6 pg (ref 26.0–34.0)
MCHC: 33.6 g/dL (ref 32.0–36.0)
MCV: 79.2 fL — ABNORMAL LOW (ref 80.0–100.0)
Platelets: 149 10*3/uL — ABNORMAL LOW (ref 150–440)
RBC: 4.99 MIL/uL (ref 3.80–5.20)
RDW: 16.1 % — AB (ref 11.5–14.5)
WBC: 8 10*3/uL (ref 3.6–11.0)

## 2014-08-21 LAB — URINALYSIS COMPLETE WITH MICROSCOPIC (ARMC ONLY)
Bacteria, UA: NONE SEEN
Bilirubin Urine: NEGATIVE
Glucose, UA: NEGATIVE mg/dL
Ketones, ur: NEGATIVE mg/dL
Leukocytes, UA: NEGATIVE
Nitrite: NEGATIVE
PH: 5 (ref 5.0–8.0)
Protein, ur: NEGATIVE mg/dL
Specific Gravity, Urine: 1.021 (ref 1.005–1.030)

## 2014-08-21 LAB — COMPREHENSIVE METABOLIC PANEL
ALBUMIN: 4.3 g/dL (ref 3.5–5.0)
ALT: 10 U/L — AB (ref 14–54)
AST: 18 U/L (ref 15–41)
Alkaline Phosphatase: 64 U/L (ref 38–126)
Anion gap: 6 (ref 5–15)
BUN: 10 mg/dL (ref 6–20)
CO2: 25 mmol/L (ref 22–32)
Calcium: 8.9 mg/dL (ref 8.9–10.3)
Chloride: 107 mmol/L (ref 101–111)
Creatinine, Ser: 0.9 mg/dL (ref 0.44–1.00)
GFR calc Af Amer: >60 mL/min (ref >60)
Glucose, Bld: 108 mg/dL — ABNORMAL HIGH (ref 65–99)
Potassium: 3.8 mmol/L (ref 3.5–5.1)
Sodium: 138 mmol/L (ref 135–145)
TOTAL PROTEIN: 7 g/dL (ref 6.5–8.1)
Total Bilirubin: 1.7 mg/dL — ABNORMAL HIGH (ref 0.3–1.2)

## 2014-08-21 LAB — C DIFFICILE QUICK SCREEN W PCR REFLEX
C Diff antigen: POSITIVE — AB
C Diff interpretation: POSITIVE
C Diff toxin: POSITIVE — AB

## 2014-08-21 LAB — LIPASE, BLOOD: Lipase: 16 U/L — ABNORMAL LOW (ref 22–51)

## 2014-08-21 MED ORDER — IOHEXOL 240 MG/ML SOLN: 25.0000 mL | Freq: Once | INTRAMUSCULAR | Status: DC | PRN

## 2014-08-21 MED ORDER — MEPERIDINE HCL 25 MG/ML IJ SOLN
12.5000 mg | Freq: Once | INTRAMUSCULAR | Status: AC
  Administered 2014-08-21: 12.5 mg via INTRAVENOUS
  Filled 2014-08-21: qty 1

## 2014-08-21 MED ORDER — VANCOMYCIN 50 MG/ML ORAL SOLUTION
125.0000 mg | Freq: Once | ORAL | Status: AC
  Administered 2014-08-21: 125 mg via ORAL
  Filled 2014-08-21: qty 2.5

## 2014-08-21 MED ORDER — ONDANSETRON HCL 4 MG/2ML IJ SOLN
4.0000 mg | Freq: Once | INTRAMUSCULAR | Status: AC
  Administered 2014-08-21: 4 mg via INTRAVENOUS
  Filled 2014-08-21: qty 2

## 2014-08-21 MED ORDER — SODIUM CHLORIDE 0.9 % IV BOLUS (SEPSIS)
1000.0000 mL | Freq: Once | INTRAVENOUS | Status: AC
  Administered 2014-08-21: 1000 mL via INTRAVENOUS

## 2014-08-21 MED ORDER — VANCOMYCIN 50 MG/ML ORAL SOLUTION: 125.0000 mg | Freq: Four times a day (QID) | ORAL | Status: AC

## 2014-08-21 NOTE — Telephone Encounter (Signed)
Pt had colonoscopy Thursday.  Pt now has a fever 102.6 . Head and body aches. Nausea.  She called Dr. Vira Agar and he said he thinks it is a virus and referred her back to you.  Please call patient at [phone].  Thanks, Con Memos

## 2014-08-21 NOTE — ED Notes (Signed)
Family at bedside. 

## 2014-08-21 NOTE — Telephone Encounter (Signed)
Pt advised; Apt made for 2:45 today.   Thanks,   -Mickel Baas

## 2014-08-21 NOTE — Telephone Encounter (Signed)
Does she need to be work in today?  Thanks,   -Mickel Baas

## 2014-08-21 NOTE — Telephone Encounter (Signed)
Ok to work in. Thanks.

## 2014-08-21 NOTE — ED Notes (Signed)
Pt reports diarrhea since Sunday, reports colonoscopy with Dr Tiffany Kocher on Thursday. Pt reports fevers (103.7), denies taking anything for it. Pt reports weakness and dizziness, headache.

## 2014-08-21 NOTE — Discharge Instructions (Signed)
Clostridium Difficile Infection °Clostridium difficile (C. difficile) is a germ found in the intestines. C. difficile infection can occur after taking some medicines. C. difficile infection can cause watery poop (diarrhea) or severe disease. °HOME CARE °· Drink enough fluids to keep your pee (urine) clear or pale yellow. Avoid milk, caffeine, and alcohol. °· Ask your doctor how to replace body fluid losses (rehydrate). °· Eat small meals more often rather than large meals. °· Take your medicine (antibiotics) as told. Finish it even if you start to feel better. °· Do not  use medicines to slow the watery poop. °· Wash your hands well after using the bathroom and before preparing food. °· Make sure people who live with you wash their hands often. °· Clean all surfaces. Use a product that contains chlorine bleach. °GET HELP RIGHT AWAY IF:  °· The watery poop does not stop, or it comes back after you finish your medicine. °· You feel very dry or thirsty (dehydrated). °· You have a fever. °· You have more belly (abdominal) pain or tenderness. °· There is blood in your poop (stool), or your poop is black and tar-like. °· You cannot eat food or drink liquids without throwing up (vomiting). °MAKE SURE YOU: °· Understand these instructions. °· Will watch your condition. °· Will get help right away if you are not doing well or get worse. °Document Released: 11/16/2008 Document Revised: 06/05/2013 Document Reviewed: 06/27/2010 °[employer]® Patient Information ©2015 [employer]. This information is not intended to replace advice given to you by your health care provider. Make sure you discuss any questions you have with your health care provider. ° °

## 2014-08-21 NOTE — ED Notes (Signed)
Pt refusing PO contrast, Dr Clearnce Hasten made aware.
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
## 2014-08-22 ENCOUNTER — Telehealth: Payer: Self-pay | Admitting: Emergency Medicine

## 2014-08-22 NOTE — ED Notes (Signed)
[facility] called asking to change liquid solution vancomycin to pill form, as pt requests and they do not have solution.  Change to pills per dr Jimmye Norman

## 2014-08-24 LAB — STOOL CULTURE

## 2014-08-27 ENCOUNTER — Encounter: Payer: Self-pay | Admitting: Unknown Physician Specialty

## 2014-08-27 LAB — CULTURE, BLOOD (ROUTINE X 2)
Culture: NO GROWTH
Culture: NO GROWTH

## 2014-08-29 ENCOUNTER — Telehealth: Payer: Self-pay | Admitting: Family Medicine

## 2014-08-29 ENCOUNTER — Other Ambulatory Visit: Payer: Self-pay | Admitting: Family Medicine

## 2014-08-29 DIAGNOSIS — J309 Allergic rhinitis, unspecified: Secondary | ICD-10-CM

## 2014-08-29 MED ORDER — ALBUTEROL SULFATE HFA 108 (90 BASE) MCG/ACT IN AERS: 1.0000 | INHALATION_SPRAY | Freq: Four times a day (QID) | RESPIRATORY_TRACT | Status: DC | PRN

## 2014-08-29 NOTE — Telephone Encounter (Signed)
Pt contacted office for refill request on the following medications: albuterol (PROVENTIL HFA;VENTOLIN HFA) 108 (90 BASE) MCG/ACT inhaler and albuterol-ipratropium (COMBIVENT) 18-103 MCG/ACT inhaler.  [facility].  Pt states she is completely out.  OE#[phone]/[id number]

## 2014-09-03 ENCOUNTER — Other Ambulatory Visit: Payer: Self-pay | Admitting: Family Medicine

## 2014-09-03 DIAGNOSIS — F32A Depression, unspecified: Secondary | ICD-10-CM

## 2014-09-03 DIAGNOSIS — F329 Major depressive disorder, single episode, unspecified: Secondary | ICD-10-CM

## 2014-09-04 ENCOUNTER — Other Ambulatory Visit: Payer: Self-pay | Admitting: Family Medicine

## 2014-09-04 DIAGNOSIS — F329 Major depressive disorder, single episode, unspecified: Secondary | ICD-10-CM

## 2014-09-04 DIAGNOSIS — F32A Depression, unspecified: Secondary | ICD-10-CM

## 2014-09-30 ENCOUNTER — Other Ambulatory Visit: Payer: Self-pay | Admitting: Family Medicine

## 2014-09-30 DIAGNOSIS — K219 Gastro-esophageal reflux disease without esophagitis: Secondary | ICD-10-CM

## 2014-10-01 DIAGNOSIS — K219 Gastro-esophageal reflux disease without esophagitis: Secondary | ICD-10-CM | POA: Insufficient documentation

## 2014-10-06 ENCOUNTER — Encounter: Payer: Self-pay | Admitting: Emergency Medicine

## 2014-10-06 ENCOUNTER — Other Ambulatory Visit: Payer: Self-pay | Admitting: Family Medicine

## 2014-10-06 ENCOUNTER — Emergency Department: Payer: Medicaid Other

## 2014-10-06 ENCOUNTER — Inpatient Hospital Stay
Admission: EM | Admit: 2014-10-06 | Discharge: 2014-10-12 | DRG: 371 | Disposition: A | Discharge status: Home / Self Care | Payer: Medicaid Other | Attending: Internal Medicine | Admitting: Internal Medicine

## 2014-10-06 ENCOUNTER — Other Ambulatory Visit: Payer: Self-pay

## 2014-10-06 DIAGNOSIS — Z888 Allergy status to other drugs, medicaments and biological substances status: Secondary | ICD-10-CM

## 2014-10-06 DIAGNOSIS — I4510 Unspecified right bundle-branch block: Secondary | ICD-10-CM | POA: Diagnosis present

## 2014-10-06 DIAGNOSIS — R040 Epistaxis: Secondary | ICD-10-CM | POA: Diagnosis not present

## 2014-10-06 DIAGNOSIS — I370 Nonrheumatic pulmonary valve stenosis: Secondary | ICD-10-CM | POA: Diagnosis present

## 2014-10-06 DIAGNOSIS — I509 Heart failure, unspecified: Secondary | ICD-10-CM

## 2014-10-06 DIAGNOSIS — R1084 Generalized abdominal pain: Secondary | ICD-10-CM

## 2014-10-06 DIAGNOSIS — R0902 Hypoxemia: Secondary | ICD-10-CM

## 2014-10-06 DIAGNOSIS — Z881 Allergy status to other antibiotic agents status: Secondary | ICD-10-CM

## 2014-10-06 DIAGNOSIS — N951 Menopausal and female climacteric states: Secondary | ICD-10-CM

## 2014-10-06 DIAGNOSIS — I959 Hypotension, unspecified: Secondary | ICD-10-CM

## 2014-10-06 DIAGNOSIS — A047 Enterocolitis due to Clostridium difficile: Principal | ICD-10-CM | POA: Diagnosis present

## 2014-10-06 DIAGNOSIS — R0602 Shortness of breath: Secondary | ICD-10-CM

## 2014-10-06 DIAGNOSIS — J189 Pneumonia, unspecified organism: Secondary | ICD-10-CM | POA: Diagnosis present

## 2014-10-06 DIAGNOSIS — R197 Diarrhea, unspecified: Secondary | ICD-10-CM

## 2014-10-06 DIAGNOSIS — G43909 Migraine, unspecified, not intractable, without status migrainosus: Secondary | ICD-10-CM | POA: Diagnosis present

## 2014-10-06 DIAGNOSIS — G8929 Other chronic pain: Secondary | ICD-10-CM | POA: Diagnosis present

## 2014-10-06 DIAGNOSIS — E559 Vitamin D deficiency, unspecified: Secondary | ICD-10-CM | POA: Diagnosis present

## 2014-10-06 DIAGNOSIS — K219 Gastro-esophageal reflux disease without esophagitis: Secondary | ICD-10-CM | POA: Diagnosis present

## 2014-10-06 DIAGNOSIS — Z885 Allergy status to narcotic agent status: Secondary | ICD-10-CM

## 2014-10-06 DIAGNOSIS — I272 Other secondary pulmonary hypertension: Secondary | ICD-10-CM | POA: Diagnosis present

## 2014-10-06 DIAGNOSIS — I5031 Acute diastolic (congestive) heart failure: Secondary | ICD-10-CM | POA: Diagnosis present

## 2014-10-06 DIAGNOSIS — K589 Irritable bowel syndrome without diarrhea: Secondary | ICD-10-CM | POA: Diagnosis present

## 2014-10-06 DIAGNOSIS — F329 Major depressive disorder, single episode, unspecified: Secondary | ICD-10-CM | POA: Diagnosis present

## 2014-10-06 DIAGNOSIS — R109 Unspecified abdominal pain: Secondary | ICD-10-CM

## 2014-10-06 DIAGNOSIS — A0472 Enterocolitis due to Clostridium difficile, not specified as recurrent: Secondary | ICD-10-CM | POA: Diagnosis present

## 2014-10-06 DIAGNOSIS — J9601 Acute respiratory failure with hypoxia: Secondary | ICD-10-CM | POA: Diagnosis not present

## 2014-10-06 DIAGNOSIS — J309 Allergic rhinitis, unspecified: Secondary | ICD-10-CM | POA: Diagnosis present

## 2014-10-06 DIAGNOSIS — Z79899 Other long term (current) drug therapy: Secondary | ICD-10-CM

## 2014-10-06 LAB — URINALYSIS COMPLETE WITH MICROSCOPIC (ARMC ONLY)
BILIRUBIN URINE: NEGATIVE
GLUCOSE, UA: NEGATIVE mg/dL
KETONES UR: NEGATIVE mg/dL
LEUKOCYTES UA: NEGATIVE
NITRITE: NEGATIVE
Protein, ur: NEGATIVE mg/dL
SPECIFIC GRAVITY, URINE: 1.017 (ref 1.005–1.030)
pH: 5 (ref 5.0–8.0)

## 2014-10-06 LAB — CBC
HCT: 35.6 % (ref 35.0–47.0)
Hemoglobin: 12 g/dL (ref 12.0–16.0)
MCH: 26.4 pg (ref 26.0–34.0)
MCHC: 33.8 g/dL (ref 32.0–36.0)
MCV: 78.3 fL — ABNORMAL LOW (ref 80.0–100.0)
PLATELETS: 152 10*3/uL (ref 150–440)
RBC: 4.55 MIL/uL (ref 3.80–5.20)
RDW: 15 % — ABNORMAL HIGH (ref 11.5–14.5)
WBC: 6.1 10*3/uL (ref 3.6–11.0)

## 2014-10-06 LAB — COMPREHENSIVE METABOLIC PANEL
ALK PHOS: 51 U/L (ref 38–126)
ALT: 9 U/L — AB (ref 14–54)
AST: 19 U/L (ref 15–41)
Albumin: 3.9 g/dL (ref 3.5–5.0)
Anion gap: 7 (ref 5–15)
BUN: 10 mg/dL (ref 6–20)
CALCIUM: 8.6 mg/dL — AB (ref 8.9–10.3)
CO2: 24 mmol/L (ref 22–32)
CREATININE: 0.88 mg/dL (ref 0.44–1.00)
Chloride: 110 mmol/L (ref 101–111)
Glucose, Bld: 116 mg/dL — ABNORMAL HIGH (ref 65–99)
Potassium: 3.7 mmol/L (ref 3.5–5.1)
Sodium: 141 mmol/L (ref 135–145)
Total Bilirubin: 1.3 mg/dL — ABNORMAL HIGH (ref 0.3–1.2)
Total Protein: 6.4 g/dL — ABNORMAL LOW (ref 6.5–8.1)

## 2014-10-06 LAB — TROPONIN I: Troponin I: <0.03 ng/mL (ref <0.031)

## 2014-10-06 LAB — LACTIC ACID, PLASMA
LACTIC ACID, VENOUS: 1.4 mmol/L (ref 0.5–2.0)
LACTIC ACID, VENOUS: 2 mmol/L (ref 0.5–2.0)

## 2014-10-06 LAB — CLOSTRIDIUM DIFFICILE BY PCR: Toxigenic C. Difficile by PCR: POSITIVE — AB

## 2014-10-06 LAB — LIPASE, BLOOD: LIPASE: 25 U/L (ref 22–51)

## 2014-10-06 MED ORDER — POTASSIUM CHLORIDE IN NACL 20-0.9 MEQ/L-% IV SOLN
INTRAVENOUS | Status: DC
  Administered 2014-10-06: 11:00:00 via INTRAVENOUS
  Filled 2014-10-06: qty 1000

## 2014-10-06 MED ORDER — MONTELUKAST SODIUM 10 MG PO TABS
10.0000 mg | ORAL_TABLET | Freq: Every day | ORAL | Status: DC
  Administered 2014-10-06 – 2014-10-10 (×4): 10 mg via ORAL
  Filled 2014-10-06 (×6): qty 1

## 2014-10-06 MED ORDER — NORTRIPTYLINE HCL 25 MG PO CAPS
50.0000 mg | ORAL_CAPSULE | Freq: Every day | ORAL | Status: DC
  Administered 2014-10-06 – 2014-10-10 (×4): 50 mg via ORAL
  Filled 2014-10-06 (×7): qty 2

## 2014-10-06 MED ORDER — POTASSIUM CHLORIDE IN NACL 20-0.9 MEQ/L-% IV SOLN
INTRAVENOUS | Status: DC
  Administered 2014-10-06 – 2014-10-09 (×7): via INTRAVENOUS
  Filled 2014-10-06 (×10): qty 1000

## 2014-10-06 MED ORDER — PANTOPRAZOLE SODIUM 40 MG IV SOLR
40.0000 mg | Freq: Two times a day (BID) | INTRAVENOUS | Status: DC
  Administered 2014-10-06 – 2014-10-09 (×7): 40 mg via INTRAVENOUS
  Filled 2014-10-06 (×7): qty 40

## 2014-10-06 MED ORDER — SODIUM CHLORIDE 0.9 % IV BOLUS (SEPSIS)
1000.0000 mL | INTRAVENOUS | Status: AC
  Administered 2014-10-06 (×2): 1000 mL via INTRAVENOUS

## 2014-10-06 MED ORDER — FLUTICASONE PROPIONATE 50 MCG/ACT NA SUSP: 2.0000 | Freq: Every day | NASAL | Status: DC | PRN

## 2014-10-06 MED ORDER — GABAPENTIN 300 MG PO CAPS
300.0000 mg | ORAL_CAPSULE | Freq: Three times a day (TID) | ORAL | Status: DC
  Administered 2014-10-06 – 2014-10-12 (×12): 300 mg via ORAL
  Filled 2014-10-06 (×16): qty 1

## 2014-10-06 MED ORDER — PIPERACILLIN-TAZOBACTAM 3.375 G IVPB 30 MIN
3.3750 g | Freq: Once | INTRAVENOUS | Status: AC
  Administered 2014-10-06: 3.375 g via INTRAVENOUS
  Filled 2014-10-06: qty 50

## 2014-10-06 MED ORDER — ONDANSETRON HCL 4 MG PO TABS
4.0000 mg | ORAL_TABLET | Freq: Four times a day (QID) | ORAL | Status: DC | PRN
  Administered 2014-10-08 – 2014-10-10 (×3): 4 mg via ORAL
  Filled 2014-10-06 (×3): qty 1

## 2014-10-06 MED ORDER — IPRATROPIUM-ALBUTEROL 0.5-2.5 (3) MG/3ML IN SOLN
3.0000 mL | Freq: Four times a day (QID) | RESPIRATORY_TRACT | Status: DC
  Administered 2014-10-06 – 2014-10-07 (×4): 3 mL via RESPIRATORY_TRACT
  Filled 2014-10-06 (×7): qty 3

## 2014-10-06 MED ORDER — LORATADINE 10 MG PO TABS
10.0000 mg | ORAL_TABLET | Freq: Every day | ORAL | Status: DC
  Administered 2014-10-08 – 2014-10-12 (×5): 10 mg via ORAL
  Filled 2014-10-06 (×6): qty 1

## 2014-10-06 MED ORDER — PANTOPRAZOLE SODIUM 40 MG PO TBEC: 40.0000 mg | DELAYED_RELEASE_TABLET | Freq: Two times a day (BID) | ORAL | Status: DC

## 2014-10-06 MED ORDER — VANCOMYCIN 50 MG/ML ORAL SOLUTION
500.0000 mg | Freq: Four times a day (QID) | ORAL | Status: DC
  Filled 2014-10-06 (×6): qty 10

## 2014-10-06 MED ORDER — HYOSCYAMINE SULFATE 0.125 MG PO TABS
0.1250 mg | ORAL_TABLET | Freq: Every day | ORAL | Status: DC | PRN
  Filled 2014-10-06: qty 1

## 2014-10-06 MED ORDER — ALBUTEROL SULFATE (2.5 MG/3ML) 0.083% IN NEBU: 3.0000 mL | INHALATION_SOLUTION | Freq: Four times a day (QID) | RESPIRATORY_TRACT | Status: DC | PRN

## 2014-10-06 MED ORDER — ACETAMINOPHEN 650 MG RE SUPP: 650.0000 mg | Freq: Four times a day (QID) | RECTAL | Status: DC | PRN

## 2014-10-06 MED ORDER — ONDANSETRON HCL 4 MG/2ML IJ SOLN
INTRAMUSCULAR | Status: AC
  Administered 2014-10-06: 4 mg via INTRAVENOUS
  Filled 2014-10-06: qty 2

## 2014-10-06 MED ORDER — HEPARIN SODIUM (PORCINE) 5000 UNIT/ML IJ SOLN
5000.0000 [IU] | Freq: Three times a day (TID) | INTRAMUSCULAR | Status: DC
  Administered 2014-10-06 – 2014-10-09 (×8): 5000 [IU] via SUBCUTANEOUS
  Filled 2014-10-06 (×10): qty 1

## 2014-10-06 MED ORDER — DICYCLOMINE HCL 20 MG PO TABS: 20.0000 mg | ORAL_TABLET | Freq: Four times a day (QID) | ORAL | Status: DC | PRN

## 2014-10-06 MED ORDER — DIPHENOXYLATE-ATROPINE 2.5-0.025 MG PO TABS
1.0000 | ORAL_TABLET | Freq: Four times a day (QID) | ORAL | Status: DC
  Administered 2014-10-06 – 2014-10-10 (×11): 1 via ORAL
  Filled 2014-10-06 (×13): qty 1

## 2014-10-06 MED ORDER — ONDANSETRON HCL 4 MG/2ML IJ SOLN
4.0000 mg | Freq: Once | INTRAMUSCULAR | Status: AC
  Administered 2014-10-06: 4 mg via INTRAVENOUS

## 2014-10-06 MED ORDER — ONDANSETRON HCL 4 MG/2ML IJ SOLN
4.0000 mg | Freq: Four times a day (QID) | INTRAMUSCULAR | Status: DC | PRN
  Administered 2014-10-06 – 2014-10-11 (×8): 4 mg via INTRAVENOUS
  Filled 2014-10-06 (×8): qty 2

## 2014-10-06 MED ORDER — FAMOTIDINE IN NACL 20-0.9 MG/50ML-% IV SOLN
20.0000 mg | Freq: Two times a day (BID) | INTRAVENOUS | Status: DC
  Filled 2014-10-06 (×3): qty 50

## 2014-10-06 MED ORDER — ESTRADIOL 1 MG PO TABS
0.5000 mg | ORAL_TABLET | Freq: Every day | ORAL | Status: DC
  Administered 2014-10-06 – 2014-10-12 (×7): 0.5 mg via ORAL
  Filled 2014-10-06 (×7): qty 1

## 2014-10-06 MED ORDER — ACETAMINOPHEN 325 MG PO TABS
650.0000 mg | ORAL_TABLET | Freq: Four times a day (QID) | ORAL | Status: DC | PRN
  Administered 2014-10-06 – 2014-10-10 (×8): 650 mg via ORAL
  Filled 2014-10-06 (×9): qty 2

## 2014-10-06 MED ORDER — CITALOPRAM HYDROBROMIDE 20 MG PO TABS
20.0000 mg | ORAL_TABLET | Freq: Every day | ORAL | Status: DC
  Administered 2014-10-06 – 2014-10-12 (×7): 20 mg via ORAL
  Filled 2014-10-06 (×7): qty 1

## 2014-10-06 MED ORDER — VANCOMYCIN HCL 500 MG IV SOLR
500.0000 mg | Freq: Four times a day (QID) | Status: DC
  Filled 2014-10-06 (×6): qty 500

## 2014-10-06 NOTE — ED Notes (Signed)
Attempted to call report to 2c, nurse stated they could not take pt on the floor with SBP 80s/90s, Dr. Doy Hutching paged and made aware, Dr. Doy Hutching stated he would call the supervisor, floor then called and stated they would take pt with BP parameters, Dr. Doy Hutching paged and notified again of situation, Dr. Doy Hutching stated to send pt to floor and he would place parameter orders

## 2014-10-06 NOTE — ED Notes (Signed)
Diarrhea 1 monthago, treated for C dif, symptoms had resolved, on amoxicillin for dental procedure x 4 days, diarrhea started again this am.

## 2014-10-06 NOTE — H&P (Signed)
History and Physical    [name]:[id number] DOB: 05/18/1965 DOA: 10/06/2014  Referring physician: Dr. Clearnce Hasten PCP: Margarita Rana, MD  Specialists: Dr. Vira Agar  Chief Complaint: diarrhea with abdominal pain.  HPI: Erin Richards is a 49 y.o. female has a past medical history significant for C.Diff and chronic abdominal pain now to ER with epigastric abdominal pain and diarrhea. Hypotensive in ER. Improved with IV fluids. Was on Amoxil recently for dental procedure. Some nuasea but no vomiting. Afebrile. WBC normal. Not tachycardic. Low grade fever at 99.4.  Review of Systems: The patient denies weight loss,, vision loss, decreased hearing, hoarseness, chest pain, syncope, dyspnea on exertion, peripheral edema, balance deficits, hemoptysis,  melena, hematochezia, severe indigestion/heartburn, hematuria, incontinence, genital sores, muscle weakness, suspicious skin lesions, transient blindness, difficulty walking, depression, unusual weight change, abnormal bleeding, enlarged lymph nodes, angioedema, and breast masses.   Past Medical History  Diagnosis Date  . IBS (irritable bowel syndrome)   . 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 Allergy   . Depression    Past Surgical History  Procedure Laterality Date  . Cardiac surgery    . Abdominal hysterectomy    . Colonoscopy with propofol N/A 08/16/2014    Procedure: COLONOSCOPY WITH PROPOFOL;  Surgeon: Manya Silvas, MD;  Location: [facility] ENDOSCOPY;  Service: Endoscopy;  Laterality: N/A;  . Esophagogastroduodenoscopy (egd) with propofol  08/16/2014    Procedure: ESOPHAGOGASTRODUODENOSCOPY (EGD) WITH PROPOFOL;  Surgeon: Manya Silvas, MD;  Location: [facility] ENDOSCOPY;  Service: Endoscopy;;   Social History:  reports that she has never smoked. She has never used smokeless tobacco. She reports that she does not drink alcohol or use illicit drugs.  Allergies  Allergen Reactions  . Acetaminophen-Codeine Nausea And  Vomiting  . Aspartame   . Aspartame And Phenylalanine Nausea And Vomiting  . Antiseptic Oral Rinse  [Cetylpyridinium Chloride] Other (See Comments)    UNK  . Biaxin [Clarithromycin] Nausea And Vomiting  . Chlorhexidine Other (See Comments)  . Chlorhexidine Gluconate Nausea And Vomiting  . Clindamycin   . Clindamycin/Lincomycin Nausea And Vomiting  . Codeine   . Dextromethorphan Hbr   . Dilaudid [Hydromorphone Hcl] Nausea And Vomiting  . Doxycycline Nausea And Vomiting  . Fentanyl Nausea And Vomiting  . Germanium Other (See Comments)  . Hydrocodone Nausea And Vomiting  . Ketorolac Other (See Comments)  . Levofloxacin Other (See Comments)    GI upset. GI upset  . Mefenamic Acid Nausea And Vomiting  . Metformin And Related Nausea And Vomiting  . Metronidazole     GI upset  . Morphine And Related Nausea And Vomiting  . Morphine Sulfate   . Moxifloxacin Swelling  . Moxifloxacin Hcl In Nacl   . Nitrofuran Derivatives   . Nitrofurantoin Nausea And Vomiting and Other (See Comments)  . Nitrofurantoin Monohyd Macro   . Nsaids   . Other Other (See Comments)  . Oxycodone-Acetaminophen Nausea And Vomiting  . Periguard [Dimethicone] Nausea And Vomiting  . Phenothiazines Nausea And Vomiting  . Pioglitazone Nausea And Vomiting  . Quinidine Nausea And Vomiting  . Quinine Derivatives Nausea And Vomiting  . Quinolones Nausea And Vomiting  . Rumex Crispus Other (See Comments)  . Tetracycline Nausea And Vomiting  . Tetracyclines & Related Nausea And Vomiting  . Toradol [Ketorolac Tromethamine] Nausea And Vomiting  . Tramadol Nausea And Vomiting  . Tussin [Guaifenesin] Nausea And Vomiting  . Tussionex Pennkinetic Er [Hydrocod Polst-Cpm Polst Er] Nausea And Vomiting  .  Buprenorphine Hcl Nausea And Vomiting  . Hydrocodone-Chlorpheniramine Nausea And Vomiting  . Hydromorphone Nausea And Vomiting and Other (See Comments)    UNK  . Lincomycin Hcl Nausea And Vomiting  . Phenylalanine Nausea  And Vomiting    Family History  Problem Relation Age of Onset  . Heart disease Father   . Cancer Father     PANCREAS  . Cancer Sister     BREAST    Prior to Admission medications   Medication Sig Start Date End Date Taking? Authorizing Provider  albuterol (PROVENTIL HFA;VENTOLIN HFA) 108 (90 BASE) MCG/ACT inhaler Inhale 1-2 puffs into the lungs every 6 (six) hours as needed for shortness of breath. 08/29/14   Margarita Rana, MD  cetirizine (ZYRTEC) 10 MG tablet TAKE 1 TABLET EVERY DAY 08/29/14   Margarita Rana, MD  citalopram (CELEXA) 20 MG tablet TAKE 1 TABLET BY MOUTH DAILY 09/04/14   Margarita Rana, MD  COMBIVENT RESPIMAT 20-100 MCG/ACT AERS respimat USE 2 INHALATIONS EVERY 4 HOURS AS NEEDED 05/20/14   Historical Provider, MD  dicyclomine (BENTYL) 20 MG tablet Take 1 tablet (20 mg total) by mouth 4 (four) times daily as needed for spasms. 08/13/14   Lucilla Lame, MD  estradiol (ESTRACE) 0.5 MG tablet Take 0.5 mg by mouth daily.    Historical Provider, MD  flunisolide (NASAREL) 29 MCG/ACT (0.025%) nasal spray Place into the nose.    Historical Provider, MD  fluticasone (FLONASE) 50 MCG/ACT nasal spray Place 2 sprays into both nostrils daily as needed for allergies or rhinitis.    Historical Provider, MD  gabapentin (NEURONTIN) 300 MG capsule Take 300 mg by mouth 3 (three) times daily.    Historical Provider, MD  hyoscyamine (LEVSIN, ANASPAZ) 0.125 MG tablet Take by mouth. 08/08/14 08/08/15  Historical Provider, MD  ipratropium (ATROVENT) 0.03 % nasal spray Place into the nose.    Historical Provider, MD  montelukast (SINGULAIR) 10 MG tablet Take 1 tablet by mouth daily.    Historical Provider, MD  nortriptyline (PAMELOR) 50 MG capsule Take 1 capsule by mouth daily. 03/12/14   Historical Provider, MD  pantoprazole (PROTONIX) 40 MG tablet TAKE 1 TABLET BY MOUTH TWICE A DAY 10/01/14   Margarita Rana, MD  tapentadol (NUCYNTA) 50 MG TABS tablet Take 1 tablet by mouth 2 (two) times daily. 03/13/14   Historical  Provider, MD   Physical Exam: Filed Vitals:   10/06/14 0809 10/06/14 0830 10/06/14 0907 10/06/14 0924  BP: 86/41 67/40 89/43  95/39  Pulse: 99 98 93 88  Temp: 99.1 F (37.3 C)     TempSrc: Oral     Resp: 20 20 20 20   Height: 4\' 9"  (1.448 m) 4\' 9"  (1.448 m)    Weight: 63.504 kg (140 lb) 63.504 kg (140 lb)    SpO2: 96% 98% 93% 97%     General:  No apparent distress  Eyes: PERRL, EOMI, no scleral icterus  ENT: moist oropharynx  Neck: supple, no lymphadenopathy  Cardiovascular: regular rate without MRG; 2+ peripheral pulses, no JVD, no peripheral edema  Respiratory: CTA biL, good air movement without wheezing, rhonchi or crackled  Abdomen: soft, tender to palpation in the epigastrum without rebound, positive bowel sounds, no guarding.  Skin: no rashes  Musculoskeletal: normal bulk and tone, no joint swelling  Psychiatric: normal mood and affect  Neurologic: CN 2-12 grossly intact, MS 5/5 in all 4  Labs on Admission:  Basic Metabolic Panel:  Recent Labs Lab 10/06/14 0826  NA 141  K 3.7  CL 110  CO2 24  GLUCOSE 116*  BUN 10  CREATININE 0.88  CALCIUM 8.6*   Liver Function Tests:  Recent Labs Lab 10/06/14 0826  AST 19  ALT 9*  ALKPHOS 51  BILITOT 1.3*  PROT 6.4*  ALBUMIN 3.9    Recent Labs Lab 10/06/14 0826  LIPASE 25   No results for input(s): AMMONIA in the last 168 hours. CBC:  Recent Labs Lab 10/06/14 0826  WBC 6.1  HGB 12.0  HCT 35.6  MCV 78.3*  PLT 152   Cardiac Enzymes: No results for input(s): CKTOTAL, CKMB, CKMBINDEX, TROPONINI in the last 168 hours.  BNP (last 3 results) No results for input(s): BNP in the last 8760 hours.  ProBNP (last 3 results) No results for input(s): PROBNP in the last 8760 hours.  CBG: No results for input(s): GLUCAP in the last 168 hours.  Radiological Exams on Admission: Dg Chest Port 1 View  10/06/2014   CLINICAL DATA:  Diarrhea.  Epigastric abdominal pain.  EXAM: PORTABLE CHEST - 1 VIEW   COMPARISON:  CT chest 09/21/2013  FINDINGS: There is no focal parenchymal opacity. There is no pleural effusion or pneumothorax. The heart and mediastinal contours are unremarkable. There is prominence of the central pulmonary vasculature. There is evidence of prior median sternotomy.  The osseous structures are unremarkable.  IMPRESSION: No active disease.   Electronically Signed   By: Kathreen Devoid   On: 10/06/2014 09:21    EKG: Independently reviewed.  Assessment/Plan Active Problems:   Diarrhea   Abdominal pain   Hypotension   Will observe with IV fluids. Evaluate for recurrent C.Diff. Hold off on ABX for now. GI consult ordered. Monitor BP closely. Repeat labs in AM.  Diet: clear liq Fluids: NS with K+ DVT Prophylaxis: SQ Heparin  Code Status: FULL  Family Communication: none  Disposition Plan: home  Time spent: 50 min
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
## 2014-10-06 NOTE — ED Notes (Signed)
Pt placed on 1L [place]

## 2014-10-07 DIAGNOSIS — A0472 Enterocolitis due to Clostridium difficile, not specified as recurrent: Secondary | ICD-10-CM

## 2014-10-07 DIAGNOSIS — K589 Irritable bowel syndrome without diarrhea: Secondary | ICD-10-CM | POA: Diagnosis present

## 2014-10-07 DIAGNOSIS — Z79899 Other long term (current) drug therapy: Secondary | ICD-10-CM | POA: Diagnosis not present

## 2014-10-07 DIAGNOSIS — I959 Hypotension, unspecified: Secondary | ICD-10-CM | POA: Diagnosis present

## 2014-10-07 DIAGNOSIS — I272 Other secondary pulmonary hypertension: Secondary | ICD-10-CM | POA: Diagnosis present

## 2014-10-07 DIAGNOSIS — E559 Vitamin D deficiency, unspecified: Secondary | ICD-10-CM | POA: Diagnosis present

## 2014-10-07 DIAGNOSIS — I5031 Acute diastolic (congestive) heart failure: Secondary | ICD-10-CM | POA: Diagnosis present

## 2014-10-07 DIAGNOSIS — Z888 Allergy status to other drugs, medicaments and biological substances status: Secondary | ICD-10-CM | POA: Diagnosis not present

## 2014-10-07 DIAGNOSIS — J309 Allergic rhinitis, unspecified: Secondary | ICD-10-CM | POA: Diagnosis present

## 2014-10-07 DIAGNOSIS — F329 Major depressive disorder, single episode, unspecified: Secondary | ICD-10-CM | POA: Diagnosis present

## 2014-10-07 DIAGNOSIS — A047 Enterocolitis due to Clostridium difficile: Secondary | ICD-10-CM | POA: Diagnosis not present

## 2014-10-07 DIAGNOSIS — R040 Epistaxis: Secondary | ICD-10-CM | POA: Diagnosis not present

## 2014-10-07 DIAGNOSIS — K219 Gastro-esophageal reflux disease without esophagitis: Secondary | ICD-10-CM | POA: Diagnosis present

## 2014-10-07 DIAGNOSIS — Z881 Allergy status to other antibiotic agents status: Secondary | ICD-10-CM | POA: Diagnosis not present

## 2014-10-07 DIAGNOSIS — G43909 Migraine, unspecified, not intractable, without status migrainosus: Secondary | ICD-10-CM | POA: Diagnosis present

## 2014-10-07 DIAGNOSIS — I370 Nonrheumatic pulmonary valve stenosis: Secondary | ICD-10-CM | POA: Diagnosis present

## 2014-10-07 DIAGNOSIS — Z885 Allergy status to narcotic agent status: Secondary | ICD-10-CM | POA: Diagnosis not present

## 2014-10-07 DIAGNOSIS — I4510 Unspecified right bundle-branch block: Secondary | ICD-10-CM | POA: Diagnosis present

## 2014-10-07 DIAGNOSIS — J9601 Acute respiratory failure with hypoxia: Secondary | ICD-10-CM | POA: Diagnosis not present

## 2014-10-07 DIAGNOSIS — J189 Pneumonia, unspecified organism: Secondary | ICD-10-CM | POA: Diagnosis present

## 2014-10-07 DIAGNOSIS — G8929 Other chronic pain: Secondary | ICD-10-CM | POA: Diagnosis present

## 2014-10-07 DIAGNOSIS — R197 Diarrhea, unspecified: Secondary | ICD-10-CM | POA: Diagnosis present

## 2014-10-07 HISTORY — DX: Enterocolitis due to Clostridium difficile, not specified as recurrent: A04.72

## 2014-10-07 LAB — CBC
HCT: 29.2 % — ABNORMAL LOW (ref 35.0–47.0)
Hemoglobin: 9.8 g/dL — ABNORMAL LOW (ref 12.0–16.0)
MCH: 26.7 pg (ref 26.0–34.0)
MCHC: 33.7 g/dL (ref 32.0–36.0)
MCV: 79.4 fL — AB (ref 80.0–100.0)
PLATELETS: 90 10*3/uL — AB (ref 150–440)
RBC: 3.68 MIL/uL — ABNORMAL LOW (ref 3.80–5.20)
RDW: 15.6 % — AB (ref 11.5–14.5)
WBC: 3 10*3/uL — AB (ref 3.6–11.0)

## 2014-10-07 LAB — COMPREHENSIVE METABOLIC PANEL
ALK PHOS: 43 U/L (ref 38–126)
ALT: 7 U/L — AB (ref 14–54)
AST: 17 U/L (ref 15–41)
Albumin: 3.1 g/dL — ABNORMAL LOW (ref 3.5–5.0)
Anion gap: 4 — ABNORMAL LOW (ref 5–15)
BILIRUBIN TOTAL: 1.1 mg/dL (ref 0.3–1.2)
BUN: 10 mg/dL (ref 6–20)
CALCIUM: 7.6 mg/dL — AB (ref 8.9–10.3)
CO2: 23 mmol/L (ref 22–32)
CREATININE: 0.73 mg/dL (ref 0.44–1.00)
Chloride: 113 mmol/L — ABNORMAL HIGH (ref 101–111)
Glucose, Bld: 107 mg/dL — ABNORMAL HIGH (ref 65–99)
Potassium: 3.6 mmol/L (ref 3.5–5.1)
Sodium: 140 mmol/L (ref 135–145)
TOTAL PROTEIN: 5.3 g/dL — AB (ref 6.5–8.1)

## 2014-10-07 LAB — URINE CULTURE: CULTURE: NO GROWTH

## 2014-10-07 MED ORDER — VANCOMYCIN 50 MG/ML ORAL SOLUTION
500.0000 mg | Freq: Four times a day (QID) | ORAL | Status: DC
  Administered 2014-10-07 – 2014-10-09 (×9): 500 mg via ORAL
  Filled 2014-10-07 (×13): qty 10

## 2014-10-07 MED ORDER — VANCOMYCIN 50 MG/ML ORAL SOLUTION
500.0000 mg | Freq: Four times a day (QID) | ORAL | Status: DC
  Administered 2014-10-07: 500 mg via ORAL
  Filled 2014-10-07 (×6): qty 10

## 2014-10-07 MED ORDER — PROMETHAZINE HCL 25 MG/ML IJ SOLN
25.0000 mg | Freq: Once | INTRAMUSCULAR | Status: AC
  Administered 2014-10-07: 25 mg via INTRAVENOUS

## 2014-10-07 MED ORDER — PROMETHAZINE HCL 25 MG/ML IJ SOLN
INTRAMUSCULAR | Status: AC
  Administered 2014-10-07: 13:00:00
  Filled 2014-10-07: qty 1

## 2014-10-07 NOTE — Progress Notes (Signed)
Patient ID: Erin Richards, female   DOB: [birth year], 49 y.o.   MRN: [id number] [facility] PROGRESS NOTE  PCP: Margarita Rana, MD  HPI/Subjective: Patient started developing diarrhea again last night at least 6 episodes as per the patient. The patient did have C. difficile in the past. She went to the dentist and had a week's worth of antibiotics for after her dental work. Prior to coming into the hospital she took for Imodium. Having some cramping in the abdomen. And diarrhea.  Objective: Filed Vitals:   10/07/14 0824  BP:   Pulse: 88  Temp:   Resp:     Filed Weights   10/06/14 0809 10/06/14 0830 10/07/14 0500  Weight: 63.504 kg (140 lb) 63.504 kg (140 lb) 70.988 kg (156 lb 8 oz)    ROS: Review of Systems  Constitutional: Negative for fever and chills.  Eyes: Negative for blurred vision.  Respiratory: Negative for cough and shortness of breath.   Cardiovascular: Negative for chest pain.  Gastrointestinal: Positive for nausea, abdominal pain and diarrhea. Negative for vomiting, constipation and blood in stool.  Genitourinary: Negative for dysuria.  Musculoskeletal: Negative for joint pain.  Neurological: Negative for dizziness and headaches.   Exam: Physical Exam  Constitutional: She is oriented to person, place, and time.  HENT:  Nose: No mucosal edema.  Mouth/Throat: No oropharyngeal exudate or posterior oropharyngeal edema.  Eyes: Conjunctivae, EOM and lids are normal. Pupils are equal, round, and reactive to light.  Neck: No JVD present. Carotid bruit is not present. No edema present. No thyroid mass and no thyromegaly present.  Cardiovascular: S1 normal and S2 normal.  Exam reveals no gallop.   No murmur heard. Pulses:      Dorsalis pedis pulses are 2+ on the right side, and 2+ on the left side.  Respiratory: No respiratory distress. She has no wheezes. She has no rhonchi. She has no rales.  GI: Soft. Bowel sounds are normal. There is generalized  tenderness.  Musculoskeletal:       Right ankle: She exhibits no swelling.       Left ankle: She exhibits no swelling.  Lymphadenopathy:    She has no cervical adenopathy.  Neurological: She is alert and oriented to person, place, and time. No cranial nerve deficit.  Skin: Skin is warm. No rash noted. Nails show no clubbing.  Psychiatric: She has a normal mood and affect.    Data Reviewed: Basic Metabolic Panel:  Recent Labs Lab 10/06/14 0826  NA 141  K 3.7  CL 110  CO2 24  GLUCOSE 116*  BUN 10  CREATININE 0.88  CALCIUM 8.6*   Liver Function Tests:  Recent Labs Lab 10/06/14 0826  AST 19  ALT 9*  ALKPHOS 51  BILITOT 1.3*  PROT 6.4*  ALBUMIN 3.9   CBC:  Recent Labs Lab 10/06/14 0826 10/07/14 0525  WBC 6.1 3.0*  HGB 12.0 9.8*  HCT 35.6 29.2*  MCV 78.3* 79.4*  PLT 152 90*     Recent Results (from the past 240 hour(s))  Blood Culture (routine x 2)     Status: None (Preliminary result)   Collection Time: 10/06/14  8:46 AM  Result Value Ref Range Status   Specimen Description BLOOD RIGHT IV  Final   Special Requests   Final    BOTTLES DRAWN AEROBIC AND ANAEROBIC  AER 2CC ANA 1CC   Culture NO GROWTH < 24 HOURS  Final   Report Status PENDING  Incomplete  Blood Culture (  routine x 2)     Status: None (Preliminary result)   Collection Time: 10/06/14  8:57 AM  Result Value Ref Range Status   Specimen Description BLOOD LEFT IV  Final   Special Requests   Final    BOTTLES DRAWN AEROBIC AND ANAEROBIC  AER 5CC ANA 2CC   Culture NO GROWTH < 24 HOURS  Final   Report Status PENDING  Incomplete  Urine culture     Status: None   Collection Time: 10/06/14  9:22 AM  Result Value Ref Range Status   Specimen Description URINE, RANDOM  Final   Special Requests NONE  Final   Culture NO GROWTH 1 DAY  Final   Report Status 10/07/2014 FINAL  Final  C difficile quick scan w PCR reflex     Status: Abnormal   Collection Time: 10/06/14  4:59 PM  Result Value Ref Range  Status   C Diff antigen POSITIVE (A) NEGATIVE Final   C Diff toxin NEGATIVE NEGATIVE Final   C Diff interpretation REFLEX TO PCR  Final  Clostridium Difficile by PCR     Status: Abnormal   Collection Time: 10/06/14  4:59 PM  Result Value Ref Range Status   Toxigenic C Difficile by pcr POSITIVE (A) NEGATIVE Final    Comment: CRITICAL RESULT CALLED TO, READ BACK BY AND VERIFIED WITH: ALLISON PHILLIPS @ 2123 ON 10/06/2014 CAF      Studies: Dg Chest Port 1 View  10/06/2014   CLINICAL DATA:  Diarrhea.  Epigastric abdominal pain.  EXAM: PORTABLE CHEST - 1 VIEW  COMPARISON:  CT chest 09/21/2013  FINDINGS: There is no focal parenchymal opacity. There is no pleural effusion or pneumothorax. The heart and mediastinal contours are unremarkable. There is prominence of the central pulmonary vasculature. There is evidence of prior median sternotomy.  The osseous structures are unremarkable.  IMPRESSION: No active disease.   Electronically Signed   By: Kathreen Devoid   On: 10/06/2014 09:21    Scheduled Meds: . citalopram  20 mg Oral Daily  . diphenoxylate-atropine  1 tablet Oral QID  . estradiol  0.5 mg Oral Daily  . gabapentin  300 mg Oral TID  . heparin  5,000 Units Subcutaneous 3 times per day  . ipratropium-albuterol  3 mL Inhalation QID  . loratadine  10 mg Oral Daily  . montelukast  10 mg Oral QHS  . nortriptyline  50 mg Oral QHS  . pantoprazole (PROTONIX) IV  40 mg Intravenous Q12H  . vancomycin  500 mg Oral 4 times per day   Continuous Infusions: . 0.9 % NaCl with KCl 20 mEq / L 125 mL/hr at 10/07/14 0946    Assessment/Plan:  1. 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 Abdominal pain. Oral vancomycin every 6 hours. Would like to see diarrhea slowed down prior to discharge home. 2. Relative hypotension- continue IV fluid hydration. 3. Gastroesophageal reflux disease without esophagitis on Protonix 4. Acute respiratory failure- unclear etiology. Chest x-ray  negative. Will check a pulse ox tomorrow morning on room air. Since the patient is asymptomatic I will hold off on further workup at this point. 5. History of IBS 6. Depression- continue psychiatric medications  Code Status:     Code Status Orders        Start     Ordered   10/06/14 1432  Full code   Continuous     10/06/14 1431    Advance Directive Documentation  Most Recent Value   Type of Advance Directive  Healthcare Power of Attorney   Pre-existing out of facility DNR order (yellow form or pink MOST form)     "MOST" Form in Place?       Disposition Plan: Home once diarrhea slows down  Consultants:  Gastroenterology  Antibiotics:  Oral vancomycin  Time spent: 25 minutes  Loletha Grayer  [facility]

## 2014-10-07 NOTE — Consult Note (Signed)
  Talked to pharmacy. Vancomycin is non formulary. Will not be able to get pill form of vancomycin for few more days. Therefore, will need to continue liquid form of vancomycin and perhaps pt can get pill form of vancomycin upon discharge if her insurance will cover. Thanks.
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
## 2014-10-07 NOTE — Progress Notes (Signed)
Received a call from [facility] in the lab at 2224 stating that stool culture was positive for c-diff. Notified Dr. Jannifer Franklin. Pt allergic to numerous medications. New orders for vancomycin oral liquid. Notified pt of results and new order for oral liquid. Pt states "I do not do well when I take liquid medications. I will vomit." Notified Dr. Jannifer Franklin. New order for vancomycin enema. Informed pt of change in order. Pt agreed to take enema. Just before administration of enema, pt states that she has thought about it and she would rather take the oral solution. Dr. Jannifer Franklin then notified again of pt's request. MD reinstated original order. Medication administered as ordered. Pt tolerated well. Pt anxious and tearful all night due to abdominal pain. PRN tylenol given as ordered. Will continue to monitor.

## 2014-10-08 ENCOUNTER — Encounter: Payer: Self-pay | Admitting: Gastroenterology

## 2014-10-08 LAB — CBC
HCT: 27.7 % — ABNORMAL LOW (ref 35.0–47.0)
Hemoglobin: 9.1 g/dL — ABNORMAL LOW (ref 12.0–16.0)
MCH: 26.2 pg (ref 26.0–34.0)
MCHC: 33 g/dL (ref 32.0–36.0)
MCV: 79.4 fL — AB (ref 80.0–100.0)
PLATELETS: 86 10*3/uL — AB (ref 150–440)
RBC: 3.49 MIL/uL — AB (ref 3.80–5.20)
RDW: 15.5 % — AB (ref 11.5–14.5)
WBC: 2.7 10*3/uL — AB (ref 3.6–11.0)

## 2014-10-08 LAB — BASIC METABOLIC PANEL WITH GFR
Anion gap: 5 (ref 5–15)
BUN: 8 mg/dL (ref 6–20)
CO2: 23 mmol/L (ref 22–32)
Calcium: 7.8 mg/dL — ABNORMAL LOW (ref 8.9–10.3)
Chloride: 112 mmol/L — ABNORMAL HIGH (ref 101–111)
Creatinine, Ser: 0.69 mg/dL (ref 0.44–1.00)
GFR calc Af Amer: >60 mL/min
GFR calc non Af Amer: >60 mL/min
Glucose, Bld: 89 mg/dL (ref 65–99)
Potassium: 3.8 mmol/L (ref 3.5–5.1)
Sodium: 140 mmol/L (ref 135–145)

## 2014-10-08 LAB — C DIFFICILE QUICK SCREEN W PCR REFLEX
C Diff antigen: POSITIVE — AB
C Diff toxin: NEGATIVE

## 2014-10-08 MED ORDER — IPRATROPIUM-ALBUTEROL 0.5-2.5 (3) MG/3ML IN SOLN: 3.0000 mL | RESPIRATORY_TRACT | Status: DC | PRN

## 2014-10-08 NOTE — Progress Notes (Signed)
Pt SAT's dropped into the low 70's. Pt. Was on 2L O2 but had a nose bleed and took oxygen off. Pt complained of nausea, weakness and feeling tired. MD. Notified and Venti mask recommeded for pt due to nose bleed. I also requested continuous pulse Ox for the PT to monitor SAT's. RT saw Pt. And put O2 up to 6L.

## 2014-10-08 NOTE — Progress Notes (Signed)
Patient ID: Erin Richards, female   DOB: 1965/02/27, 49 y.o.   MRN: [id number] [facility] PROGRESS NOTE  PCP: Margarita Rana, MD  HPI/Subjective: Patient is feeling nauseous and had 2 episodes of vomiting today. Not feeling good. Still having diarrhea but at a less frequency Reporting nosebleeds intermittently which are spontaneously resolving. Abdominal pain is better  Objective: Filed Vitals:   10/08/14 0748  BP: 101/46  Pulse: 76  Temp: 98.3 F (36.8 C)  Resp:     Filed Weights   10/06/14 0830 10/07/14 0500 10/08/14 0500  Weight: 63.504 kg (140 lb) 70.988 kg (156 lb 8 oz) 71.94 kg (158 lb 9.6 oz)    ROS: Review of Systems  Constitutional: Negative for fever and chills.  Eyes: Negative for blurred vision.  Respiratory: Negative for cough and shortness of breath.   Cardiovascular: Negative for chest pain.  Gastrointestinal: Positive for nausea, abdominal pain and diarrhea. Negative for vomiting, constipation and blood in stool.  Genitourinary: Negative for dysuria.  Musculoskeletal: Negative for joint pain.  Neurological: Negative for dizziness and headaches.   Exam: Physical Exam  Constitutional: She is oriented to person, place, and time.  HENT:  Nose: No mucosal edema.  Mouth/Throat: No oropharyngeal exudate or posterior oropharyngeal edema.  Eyes: Conjunctivae, EOM and lids are normal. Pupils are equal, round, and reactive to light.  Neck: No JVD present. Carotid bruit is not present. No edema present. No thyroid mass and no thyromegaly present.  Cardiovascular: S1 normal and S2 normal.  Exam reveals no gallop.   No murmur heard. Pulses:      Dorsalis pedis pulses are 2+ on the right side, and 2+ on the left side.  Respiratory: No respiratory distress. She has no wheezes. She has no rhonchi. She has no rales.  GI: Soft. Bowel sounds are normal. There is generalized tenderness.  Musculoskeletal:       Right ankle: She exhibits no swelling.   Left ankle: She exhibits no swelling.  Lymphadenopathy:    She has no cervical adenopathy.  Neurological: She is alert and oriented to person, place, and time. No cranial nerve deficit.  Skin: Skin is warm. No rash noted. Nails show no clubbing.  Psychiatric: She has a normal mood and affect.    Data Reviewed: Basic Metabolic Panel:  Recent Labs Lab 10/06/14 0826 10/07/14 0525 10/08/14 0425  NA 141 140 140  K 3.7 3.6 3.8  CL 110 113* 112*  CO2 24 23 23   GLUCOSE 116* 107* 89  BUN 10 10 8   CREATININE 0.88 0.73 0.69  CALCIUM 8.6* 7.6* 7.8*   Liver Function Tests:  Recent Labs Lab 10/06/14 0826 10/07/14 0525  AST 19 17  ALT 9* 7*  ALKPHOS 51 43  BILITOT 1.3* 1.1  PROT 6.4* 5.3*  ALBUMIN 3.9 3.1*   CBC:  Recent Labs Lab 10/06/14 0826 10/07/14 0525 10/08/14 0425  WBC 6.1 3.0* 2.7*  HGB 12.0 9.8* 9.1*  HCT 35.6 29.2* 27.7*  MCV 78.3* 79.4* 79.4*  PLT 152 90* 86*     Recent Results (from the past 240 hour(s))  Blood Culture (routine x 2)     Status: None (Preliminary result)   Collection Time: 10/06/14  8:46 AM  Result Value Ref Range Status   Specimen Description BLOOD RIGHT IV  Final   Special Requests   Final    BOTTLES DRAWN AEROBIC AND ANAEROBIC  AER 2CC ANA 1CC   Culture NO GROWTH 2 DAYS  Final  Report Status PENDING  Incomplete  Blood Culture (routine x 2)     Status: None (Preliminary result)   Collection Time: 10/06/14  8:57 AM  Result Value Ref Range Status   Specimen Description BLOOD LEFT IV  Final   Special Requests   Final    BOTTLES DRAWN AEROBIC AND ANAEROBIC  AER 5CC ANA 2CC   Culture NO GROWTH 2 DAYS  Final   Report Status PENDING  Incomplete  Urine culture     Status: None   Collection Time: 10/06/14  9:22 AM  Result Value Ref Range Status   Specimen Description URINE, RANDOM  Final   Special Requests NONE  Final   Culture NO GROWTH 1 DAY  Final   Report Status 10/07/2014 FINAL  Final  C difficile quick scan w PCR reflex      Status: Abnormal   Collection Time: 10/06/14  4:59 PM  Result Value Ref Range Status   C Diff antigen POSITIVE (A) NEGATIVE Final   C Diff toxin NEGATIVE NEGATIVE Final   C Diff interpretation REFLEX TO PCR  Final  Clostridium Difficile by PCR     Status: Abnormal   Collection Time: 10/06/14  4:59 PM  Result Value Ref Range Status   Toxigenic C Difficile by pcr POSITIVE (A) NEGATIVE Final    Comment: CRITICAL RESULT CALLED TO, READ BACK BY AND VERIFIED WITH: ALLISON PHILLIPS @ 2123 ON 10/06/2014 CAF      Studies: No results found.  Scheduled Meds: . citalopram  20 mg Oral Daily  . diphenoxylate-atropine  1 tablet Oral QID  . estradiol  0.5 mg Oral Daily  . gabapentin  300 mg Oral TID  . heparin  5,000 Units Subcutaneous 3 times per day  . loratadine  10 mg Oral Daily  . montelukast  10 mg Oral QHS  . nortriptyline  50 mg Oral QHS  . pantoprazole (PROTONIX) IV  40 mg Intravenous Q12H  . vancomycin  500 mg Oral 4 times per day   Continuous Infusions: . 0.9 % NaCl with KCl 20 mEq / L 75 mL/hr at 10/08/14 1223    Assessment/Plan:  1. 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 With Abdominal pain.  Continue Oral vancomycin every 6 hours for a total of 2 weeks.  Patient prefers following up with Dr. Vira Agar before discharge regarding  stoolransplantation Would like to see diarrhea slowed down prior to discharge home. 2. Relative hypotension- continue IV fluid hydration. 3. Gastroesophageal reflux disease without esophagitis on Protonix 4. Acute respiratory failure- unclear etiology. Chest x-ray negative.  pulse ox is 95+ on 2 L and reporting epistaxis which could be from nasal irritation from the oxygen cannula, will wean off oxygen to room airDepression- continue psychiatric medications 5.history of irritable bowel syndrome - will provide Bentyl as needed basis   Code Status:     Code Status Orders        Start     Ordered   10/06/14 1432  Full  code   Continuous     10/06/14 1431    Advance Directive Documentation        Most Recent Value   Type of Advance Directive  Healthcare Power of Attorney   Pre-existing out of facility DNR order (yellow form or pink MOST form)     "MOST" Form in Place?       Disposition Plan: Home once diarrhea slows down, anticipated in 1-2 days  Consultants:  Gastroenterology  Antibiotics:  Oral vancomycin  Time spent: 35  minutes  greater than 50% time was spent on coordination of care  Davette Nugent, [facility]

## 2014-10-08 NOTE — Consult Note (Signed)
  GI Inpatient Follow-up Note  Patient Identification: Erin Richards is a 49 y.o. female with c.diff.  Subjective:Feels worse today. Watery diarrhea yesterday. Some bouts of nausea/vomiting.   Scheduled Inpatient Medications:  . citalopram  20 mg Oral Daily  . diphenoxylate-atropine  1 tablet Oral QID  . estradiol  0.5 mg Oral Daily  . gabapentin  300 mg Oral TID  . heparin  5,000 Units Subcutaneous 3 times per day  . loratadine  10 mg Oral Daily  . montelukast  10 mg Oral QHS  . nortriptyline  50 mg Oral QHS  . pantoprazole (PROTONIX) IV  40 mg Intravenous Q12H  . vancomycin  500 mg Oral 4 times per day    Continuous Inpatient Infusions:   . 0.9 % NaCl with KCl 20 mEq / L 75 mL/hr at 10/07/14 2039    PRN Inpatient Medications:  acetaminophen **OR** acetaminophen, albuterol, dicyclomine, fluticasone, hyoscyamine, ipratropium-albuterol, ondansetron **OR** ondansetron (ZOFRAN) IV  Review of Systems: Constitutional: Weight is stable.  Eyes: No changes in vision. ENT: No oral lesions, sore throat.  GI: see HPI.  Heme/Lymph: No easy bruising.  CV: No chest pain.  GU: No hematuria.  Integumentary: No rashes.  Neuro: No headaches.  Psych: No depression/anxiety.  Endocrine: No heat/cold intolerance.  Allergic/Immunologic: No urticaria.  Resp: No cough, SOB.  Musculoskeletal: No joint swelling.    Physical Examination: BP 101/46 mmHg  Pulse 76  Temp(Src) 98.3 F (36.8 C) (Oral)  Resp 18  Ht 4\' 9"  (1.448 m)  Wt 71.94 kg (158 lb 9.6 oz)  BMI 34.31 kg/m2  SpO2 89% Gen: NAD, alert and oriented x 4 HEENT: PEERLA, EOMI, Neck: supple, no JVD or thyromegaly Chest: CTA bilaterally, no wheezes, crackles, or other adventitious sounds CV: RRR, no m/g/c/r Abd: soft, mild lower abdominal tenderness, ND, +BS in all four quadrants; no HSM, guarding, ridigity, or rebound tenderness Ext: no edema, well perfused with 2+ pulses, Skin: no rash or lesions noted Lymph: no  LAD  Data: Lab Results  Component Value Date   WBC 2.7* 10/08/2014   HGB 9.1* 10/08/2014   HCT 27.7* 10/08/2014   MCV 79.4* 10/08/2014   PLT 86* 10/08/2014    Recent Labs Lab 10/06/14 0826 10/07/14 0525 10/08/14 0425  HGB 12.0 9.8* 9.1*   Lab Results  Component Value Date   NA 140 10/08/2014   K 3.8 10/08/2014   CL 112* 10/08/2014   CO2 23 10/08/2014   BUN 8 10/08/2014   CREATININE 0.69 10/08/2014   Lab Results  Component Value Date   ALT 7* 10/07/2014   AST 17 10/07/2014   ALKPHOS 43 10/07/2014   BILITOT 1.1 10/07/2014   No results for input(s): APTT, INR, PTT in the last 168 hours. Assessment/Plan: Erin Richards is a 49 y.o. female with recurrent diarrhea after Abx use.   Recommendations: Continue po vancomycin. If no clinical improvement soon, then will discuss with Dr. Vira Agar about stool transplant sooner than later. thanks Please call with questions or concerns.  Dinara Lupu, Lupita Dawn, MD

## 2014-10-08 NOTE — Progress Notes (Addendum)
Pt endorses pain of her face on R side; denies numbness, tingling.  Smile and expressions symmetrical; speech clear and appropriate.  States that it may be a "sinus" problem.  Dr. Margaretmary Eddy notified.  Will give tylenol and continue to monitor.

## 2014-10-09 ENCOUNTER — Inpatient Hospital Stay: Payer: Medicaid Other

## 2014-10-09 LAB — CBC
HCT: 28.1 % — ABNORMAL LOW (ref 35.0–47.0)
HEMOGLOBIN: 9.6 g/dL — AB (ref 12.0–16.0)
MCH: 26.7 pg (ref 26.0–34.0)
MCHC: 34 g/dL (ref 32.0–36.0)
MCV: 78.5 fL — ABNORMAL LOW (ref 80.0–100.0)
PLATELETS: 96 10*3/uL — AB (ref 150–440)
RBC: 3.58 MIL/uL — AB (ref 3.80–5.20)
RDW: 15.2 % — ABNORMAL HIGH (ref 11.5–14.5)
WBC: 5.5 10*3/uL (ref 3.6–11.0)

## 2014-10-09 LAB — BASIC METABOLIC PANEL
Anion gap: 3 — ABNORMAL LOW (ref 5–15)
BUN: 5 mg/dL — AB (ref 6–20)
CHLORIDE: 111 mmol/L (ref 101–111)
CO2: 25 mmol/L (ref 22–32)
CREATININE: 0.54 mg/dL (ref 0.44–1.00)
Calcium: 8.1 mg/dL — ABNORMAL LOW (ref 8.9–10.3)
GFR calc Af Amer: >60 mL/min (ref >60)
Glucose, Bld: 95 mg/dL (ref 65–99)
POTASSIUM: 3.8 mmol/L (ref 3.5–5.1)
SODIUM: 139 mmol/L (ref 135–145)

## 2014-10-09 MED ORDER — DEXTROSE 5 % IV SOLN
1.0000 g | INTRAVENOUS | Status: DC
  Administered 2014-10-09 – 2014-10-10 (×2): 1 g via INTRAVENOUS
  Filled 2014-10-09 (×3): qty 10

## 2014-10-09 MED ORDER — PANTOPRAZOLE SODIUM 40 MG PO TBEC
40.0000 mg | DELAYED_RELEASE_TABLET | Freq: Two times a day (BID) | ORAL | Status: DC
  Administered 2014-10-09 – 2014-10-10 (×2): 40 mg via ORAL
  Filled 2014-10-09 (×2): qty 1

## 2014-10-09 MED ORDER — FUROSEMIDE 10 MG/ML IJ SOLN
40.0000 mg | Freq: Once | INTRAMUSCULAR | Status: AC
Start: 2014-10-09 — End: 2014-10-09
  Administered 2014-10-09: 40 mg via INTRAVENOUS
  Filled 2014-10-09: qty 4

## 2014-10-09 MED ORDER — METRONIDAZOLE 500 MG PO TABS
500.0000 mg | ORAL_TABLET | Freq: Three times a day (TID) | ORAL | Status: DC
  Administered 2014-10-09 – 2014-10-10 (×4): 500 mg via ORAL
  Filled 2014-10-09 (×4): qty 1

## 2014-10-09 NOTE — Consult Note (Signed)
  GI Inpatient Follow-up Note  Patient Identification: Erin Richards is a 49 y.o. female with recurrent c.diff.  Subjective: Still having nausea, abdominal pain, and diarrhea. No bleeding. Getting breathing treatment. Scheduled Inpatient Medications:  . citalopram  20 mg Oral Daily  . diphenoxylate-atropine  1 tablet Oral QID  . estradiol  0.5 mg Oral Daily  . gabapentin  300 mg Oral TID  . heparin  5,000 Units Subcutaneous 3 times per day  . loratadine  10 mg Oral Daily  . montelukast  10 mg Oral QHS  . nortriptyline  50 mg Oral QHS  . pantoprazole (PROTONIX) IV  40 mg Intravenous Q12H  . vancomycin  500 mg Oral 4 times per day    Continuous Inpatient Infusions:   . 0.9 % NaCl with KCl 20 mEq / L 75 mL/hr at 10/09/14 0116    PRN Inpatient Medications:  acetaminophen **OR** acetaminophen, albuterol, dicyclomine, fluticasone, hyoscyamine, ipratropium-albuterol, ondansetron **OR** ondansetron (ZOFRAN) IV  Review of Systems: Constitutional: Weight is stable.  Eyes: No changes in vision. ENT: No oral lesions, sore throat.  GI: see HPI.  Heme/Lymph: No easy bruising.  CV: No chest pain.  GU: No hematuria.  Integumentary: No rashes.  Neuro: No headaches.  Psych: No depression/anxiety.  Endocrine: No heat/cold intolerance.  Allergic/Immunologic: No urticaria.  Resp: No cough, SOB.  Musculoskeletal: No joint swelling.    Physical Examination: BP 112/66 mmHg  Pulse 74  Temp(Src) 97.6 F (36.4 C) (Oral)  Resp 16  Ht 4\' 9"  (1.448 m)  Wt 72.349 kg (159 lb 8 oz)  BMI 34.51 kg/m2  SpO2 94% Gen: NAD, alert and oriented x 4 HEENT: PEERLA, EOMI, Neck: supple, no JVD or thyromegaly Chest: CTA bilaterally, no wheezes, crackles, or other adventitious sounds CV: RRR, no m/g/c/r Abd: soft, lower abdominal tenderness, ND, +BS in all four quadrants; no HSM, guarding, ridigity, or rebound tenderness Ext: no edema, well perfused with 2+ pulses, Skin: no rash or lesions  noted Lymph: no LAD  Data: Lab Results  Component Value Date   WBC 5.5 10/09/2014   HGB 9.6* 10/09/2014   HCT 28.1* 10/09/2014   MCV 78.5* 10/09/2014   PLT 96* 10/09/2014    Recent Labs Lab 10/07/14 0525 10/08/14 0425 10/09/14 0542  HGB 9.8* 9.1* 9.6*   Lab Results  Component Value Date   NA 139 10/09/2014   K 3.8 10/09/2014   CL 111 10/09/2014   CO2 25 10/09/2014   BUN 5* 10/09/2014   CREATININE 0.54 10/09/2014   Lab Results  Component Value Date   ALT 7* 10/07/2014   AST 17 10/07/2014   ALKPHOS 43 10/07/2014   BILITOT 1.1 10/07/2014   No results for input(s): APTT, INR, PTT in the last 168 hours. Assessment/Plan: Ms. Kuznicki is a 49 y.o. female with recurrent c.diff.  Recommendations: Continue po vanco. Discussed with Dr. Vira Agar, who will see pt tomorrow. thanks Please call with questions or concerns.  Ewart Carrera, Lupita Dawn, MD

## 2014-10-09 NOTE — Progress Notes (Signed)
Pt. SAT's dropped from the low 90's to 79 within a couple mins. Whilst taking meds. MD notified. Pt. On 6L Ventri mask, with cont. Pulse ox.

## 2014-10-09 NOTE — Progress Notes (Signed)
[place] [facility] [place] NAME: Erin Richards    MR#:  [id number]  DATE OF BIRTH:  1965-05-02  SUBJECTIVE:  No diarrhea today. Now requiring 5-6 liter [place] oxygen. Cough with phlegm+  REVIEW OF SYSTEMS:   Review of Systems  Constitutional: Negative for fever, chills and weight loss.  HENT: Negative for ear discharge, ear pain and nosebleeds.   Eyes: Negative for blurred vision, pain and discharge.  Respiratory: Positive for cough and shortness of breath. Negative for sputum production, wheezing and stridor.   Cardiovascular: Negative for chest pain, palpitations, orthopnea and PND.  Gastrointestinal: Negative for nausea, vomiting, abdominal pain and diarrhea.  Genitourinary: Negative for urgency and frequency.  Musculoskeletal: Negative for back pain and joint pain.  Neurological: Negative for sensory change, speech change, focal weakness and weakness.  Psychiatric/Behavioral: Negative for depression. The patient is not nervous/anxious.   All other systems reviewed and are negative.  Tolerating Diet:yes  DRUG ALLERGIES:   Allergies  Allergen Reactions  . Acetaminophen-Codeine Nausea And Vomiting  . Aspartame   . Aspartame And Phenylalanine Nausea And Vomiting  . Antiseptic Oral Rinse  [Cetylpyridinium Chloride] Other (See Comments)    UNK  . Biaxin [Clarithromycin] Nausea And Vomiting  . Chlorhexidine Other (See Comments)  . Chlorhexidine Gluconate Nausea And Vomiting  . Clindamycin   . Clindamycin/Lincomycin Nausea And Vomiting  . Codeine   . Dextromethorphan Hbr   . Dilaudid [Hydromorphone Hcl] Nausea And Vomiting  . Doxycycline Nausea And Vomiting  . Fentanyl Nausea And Vomiting  . Germanium Other (See Comments)  . Hydrocodone Nausea And Vomiting  . Ketorolac Other (See Comments)  . Levofloxacin Other (See Comments)    GI upset. GI upset  . Mefenamic Acid Nausea And Vomiting  . Metformin And Related Nausea And Vomiting   . Metronidazole     GI upset  . Morphine And Related Nausea And Vomiting  . Morphine Sulfate   . Moxifloxacin Swelling  . Moxifloxacin Hcl In Nacl   . Nitrofuran Derivatives   . Nitrofurantoin Nausea And Vomiting and Other (See Comments)  . Nitrofurantoin Monohyd Macro   . Nsaids   . Other Other (See Comments)  . Oxycodone-Acetaminophen Nausea And Vomiting  . Periguard [Dimethicone] Nausea And Vomiting  . Phenothiazines Nausea And Vomiting  . Pioglitazone Nausea And Vomiting  . Quinidine Nausea And Vomiting  . Quinine Derivatives Nausea And Vomiting  . Quinolones Nausea And Vomiting  . Rumex Crispus Other (See Comments)  . Tetracycline Nausea And Vomiting  . Tetracyclines & Related Nausea And Vomiting  . Toradol [Ketorolac Tromethamine] Nausea And Vomiting  . Tramadol Nausea And Vomiting  . Tussin [Guaifenesin] Nausea And Vomiting  . Tussionex Pennkinetic Er [Hydrocod Polst-Cpm Polst Er] Nausea And Vomiting  . Buprenorphine Hcl Nausea And Vomiting  . Hydrocodone-Chlorpheniramine Nausea And Vomiting  . Hydromorphone Nausea And Vomiting and Other (See Comments)    UNK  . Lincomycin Hcl Nausea And Vomiting  . Phenylalanine Nausea And Vomiting    VITALS:  Blood pressure 109/52, pulse 76, temperature 98.1 F (36.7 C), temperature source Oral, resp. rate 18, height 4\' 9"  (1.448 m), weight 72.349 kg (159 lb 8 oz), SpO2 90 %.  PHYSICAL EXAMINATION:   Physical Exam  GENERAL:  49 y.o.-year-old patient lying in the bed with no acute distress.  EYES: Pupils equal, round, reactive to light and accommodation. No scleral icterus. Extraocular muscles intact.  HEENT: Head atraumatic, normocephalic. Oropharynx and nasopharynx clear.  NECK:  Supple, no jugular venous distention. No thyroid enlargement, no tenderness.  LUNGS: coarse breath sounds bilaterally, no wheezing, rales, rhonchi. No use of accessory muscles of respiration.  CARDIOVASCULAR: S1, S2 normal. No murmurs, rubs, or  gallops.  ABDOMEN: Soft, nontender, nondistended. Bowel sounds present. No organomegaly or mass.  EXTREMITIES: No cyanosis, clubbing or edema b/l.    NEUROLOGIC: Cranial nerves II through XII are intact. No focal Motor or sensory deficits b/l.   PSYCHIATRIC:  patient is alert and oriented x 3.  SKIN: No obvious rash, lesion, or ulcer.   LABORATORY PANEL:   CBC  Recent Labs Lab 10/09/14 0542  WBC 5.5  HGB 9.6*  HCT 28.1*  PLT 96*    Chemistries   Recent Labs Lab 10/07/14 0525  10/09/14 0542  NA 140  < > 139  K 3.6  < > 3.8  CL 113*  < > 111  CO2 23  < > 25  GLUCOSE 107*  < > 95  BUN 10  < > 5*  CREATININE 0.73  < > 0.54  CALCIUM 7.6*  < > 8.1*  AST 17  --   --   ALT 7*  --   --   ALKPHOS 43  --   --   BILITOT 1.1  --   --   < > = values in this interval not displayed.  Cardiac Enzymes  Recent Labs Lab 10/06/14 1426  TROPONINI <0.03    RADIOLOGY:  Ct Chest Wo Contrast  10/09/2014   CLINICAL DATA:  Hypoxia  EXAM: CT CHEST WITHOUT CONTRAST  TECHNIQUE: Multidetector CT imaging of the chest was performed following the standard protocol without IV contrast.  COMPARISON:  08/02/2012  FINDINGS: Mediastinum/Nodes: Trace pericardial fluid. Great vessels are normal in caliber. Small mediastinal nodes are identified measuring up to 9 mm in short axis diameter in the pretracheal station image 18, not significantly changed. Heart size is normal.  Lungs/Pleura: Moderate pleural effusions are identified. Patchy areas of ill-defined ground-glass pulmonary parenchymal opacity are identified with areas of interlobular septal thickening suggesting edema. Detail is obscured by respiratory motion at multiple levels. Dependent bilateral lower lobe consolidation is identified with internal air bronchogram formation.  Upper abdomen: Adrenal glands appear normal.  Musculoskeletal: No acute osseous abnormality.  IMPRESSION: Moderate pleural effusions with bibasilar consolidation which could  indicate atelectasis although superimposed pneumonia could appear similar.  Patchy ground-glass airspace opacities could represent multi lobar pneumonia although edema or other alveolar filling process could appear similar.   Electronically Signed   By: Conchita Paris M.D.   On: 10/09/2014 17:07   Dg Chest Port 1 View  10/09/2014   CLINICAL DATA:  Shortness of breath.  EXAM: PORTABLE CHEST - 1 VIEW  COMPARISON:  10/06/2014  FINDINGS: Shallow inspiration. Cardiac enlargement with normal appearing pulmonary vascularity. Perihilar and basilar infiltrates bilaterally may represent edema or pneumonia. Small bilateral pleural effusions, greater on the left. No pneumothorax. Mediastinal contours appear intact.  IMPRESSION: Cardiac enlargement. Perihilar and basilar infiltrates suggesting pneumonia or edema. Small bilateral pleural effusion, greater on the left.   Electronically Signed   By: Lucienne Capers M.D.   On: 10/09/2014 06:35     ASSESSMENT AND PLAN:   1. 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 With Abdominal pain. Continue Oral vancomycin every 6 hours for a total of 2 weeks. Patient prefers following up with Dr. Vira Agar before discharge regarding stoolransplantation Would like to see diarrhea slowed down prior  to discharge home. -will change to po falgyl since vanc syrup is causing nausea 2. Relative hypotension- continue IV fluid hydration. 3. Gastroesophageal reflux disease without esophagitis on Protonix 4. Acute respiratory failure- suspected due to multibar pneumonia vs volume overload -CT chest noted-IV lasix 40 mg x1-IV rocephin - Chest x-ray negative. pulse ox is 95+ on 5 L and reporting epistaxis which could be from nasal irritation from the oxygen cannula, will wean off oxygen to room air. -pulmonary consult, incentive spirometry 5.history of irritable bowel syndrome - will provide Bentyl as needed basis   Case discussed with Care Management/Social  Worker. Management plans discussed with the patient, family and they are in agreement.  CODE STATUS: full  DVT Prophylaxis: heparin  TOTAL TIME TAKING CARE OF THIS PATIENT: 30inutes.  >50% time spent on counselling and coordination of care  POSSIBLE D/C IN 2-3YS, DEPENDING ON CLINICAL CONDITION.   Tommie Bohlken M.D on 10/09/2014 at 5:21 PM  Between 7am to 6pm - Pager - [phone]  After 6pm go to [email or web address] - password [email or web address]  [facility]  Office  [phone]  CC: Primary care physician; Margarita Rana, MD

## 2014-10-10 ENCOUNTER — Inpatient Hospital Stay
Admit: 2014-10-10 | Discharge: 2014-10-10 | Disposition: A | Discharge status: Home / Self Care | Payer: Medicaid Other | Attending: Internal Medicine | Admitting: Internal Medicine

## 2014-10-10 LAB — INFLUENZA PANEL BY PCR (TYPE A & B)
H1N1 flu by pcr: NOT DETECTED
INFLAPCR: NEGATIVE
INFLBPCR: NEGATIVE

## 2014-10-10 LAB — EXPECTORATED SPUTUM ASSESSMENT W GRAM STAIN, RFLX TO RESP C

## 2014-10-10 LAB — EXPECTORATED SPUTUM ASSESSMENT W REFEX TO RESP CULTURE

## 2014-10-10 MED ORDER — FIDAXOMICIN 200 MG PO TABS
200.0000 mg | ORAL_TABLET | Freq: Two times a day (BID) | ORAL | Status: DC
  Administered 2014-10-10 – 2014-10-12 (×4): 200 mg via ORAL
  Filled 2014-10-10 (×5): qty 1

## 2014-10-10 MED ORDER — ENOXAPARIN SODIUM 40 MG/0.4ML ~~LOC~~ SOLN
40.0000 mg | SUBCUTANEOUS | Status: DC
  Administered 2014-10-10: 40 mg via SUBCUTANEOUS
  Filled 2014-10-10: qty 0.4

## 2014-10-10 NOTE — Consult Note (Signed)
Pulmonary Critical Care  Initial Consult Note   Erin Richards RSW:[id number] DOB: November 29, 1965 DOA: 10/06/2014  Referring physician: Fritzi Mandes, MD PCP: Margarita Rana, MD   Chief Complaint: Shortness of Breath  HPI: Erin Richards is a 49 y.o. female with prior history of LGI bleed congenital heart disease with history of ASD cleft mitral valve repair last EF 60-65% pulmonary hypertension with RV pressures of 51 mmHg presented with diarrhea. Patient presented to the ED on Saturday with increased diarrhea. Patient states that she had been on amoxil for a dental procedure for about a week. She states that she had no SOB noted she had no chest pain noted. She did have a low grade fever noted. On presentation the x ray of 9/3 shows no active disease. She had a repeat CXR which shows presence increased infiltrates. She has had increased oxygen requirements and has now gone up to 6 liters flow. I spoke to Dr Posey Pronto and suggested getting a repeat echo .She does have prior history of increased Right sided pressures on the last echo suggestive of pulmonary hypertension. At this time she states that her diarrhea has improved   Review of Systems:  Constitutional:  +weight loss, +Fevers, +fatigue.  HEENT:  No headaches, post nasal drip,  Cardio-vascular:  No chest pain, swelling in lower extremities, dizziness, palpitations  GI:  No heartburn, indigestion, abdominal pain, nausea, vomiting, +diarrhea  Resp:  +shortness of breath with exertion. No coughing up of blood Skin:  no rash or lesions.  Musculoskeletal:  No joint pain or swelling.   Remainder ROS performed and is unremarkable other than noted in HPI  Past Medical History  Diagnosis Date  . IBS (irritable bowel syndrome)   . 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 Allergy   . Depression    Past Surgical History  Procedure Laterality Date  . Cardiac surgery    . Abdominal hysterectomy    . Colonoscopy with propofol N/A  08/16/2014    Procedure: COLONOSCOPY WITH PROPOFOL;  Surgeon: Manya Silvas, MD;  Location: [facility] ENDOSCOPY;  Service: Endoscopy;  Laterality: N/A;  . Esophagogastroduodenoscopy (egd) with propofol  08/16/2014    Procedure: ESOPHAGOGASTRODUODENOSCOPY (EGD) WITH PROPOFOL;  Surgeon: Manya Silvas, MD;  Location: [facility] ENDOSCOPY;  Service: Endoscopy;;   Social History:  reports that she has never smoked. She has never used smokeless tobacco. She reports that she does not drink alcohol or use illicit drugs.  Allergies  Allergen Reactions  . Acetaminophen-Codeine Nausea And Vomiting  . Aspartame   . Aspartame And Phenylalanine Nausea And Vomiting  . Antiseptic Oral Rinse  [Cetylpyridinium Chloride] Other (See Comments)    UNK  . Biaxin [Clarithromycin] Nausea And Vomiting  . Chlorhexidine Other (See Comments)  . Chlorhexidine Gluconate Nausea And Vomiting  . Clindamycin   . Clindamycin/Lincomycin Nausea And Vomiting  . Codeine   . Dextromethorphan Hbr   . Dilaudid [Hydromorphone Hcl] Nausea And Vomiting  . Doxycycline Nausea And Vomiting  . Fentanyl Nausea And Vomiting  . Germanium Other (See Comments)  . Hydrocodone Nausea And Vomiting  . Ketorolac Other (See Comments)  . Levofloxacin Other (See Comments)    GI upset. GI upset  . Mefenamic Acid Nausea And Vomiting  . Metformin And Related Nausea And Vomiting  . Metronidazole     GI upset  . Morphine And Related Nausea And Vomiting  . Morphine Sulfate   . Moxifloxacin Swelling  . Moxifloxacin Hcl In Nacl   .  Nitrofuran Derivatives   . Nitrofurantoin Nausea And Vomiting and Other (See Comments)  . Nitrofurantoin Monohyd Macro   . Nsaids   . Other Other (See Comments)  . Oxycodone-Acetaminophen Nausea And Vomiting  . Periguard [Dimethicone] Nausea And Vomiting  . Phenothiazines Nausea And Vomiting  . Pioglitazone Nausea And Vomiting  . Quinidine Nausea And Vomiting  . Quinine Derivatives Nausea And Vomiting  . Quinolones  Nausea And Vomiting  . Rumex Crispus Other (See Comments)  . Tetracycline Nausea And Vomiting  . Tetracyclines & Related Nausea And Vomiting  . Toradol [Ketorolac Tromethamine] Nausea And Vomiting  . Tramadol Nausea And Vomiting  . Tussin [Guaifenesin] Nausea And Vomiting  . Tussionex Pennkinetic Er [Hydrocod Polst-Cpm Polst Er] Nausea And Vomiting  . Buprenorphine Hcl Nausea And Vomiting  . Hydrocodone-Chlorpheniramine Nausea And Vomiting  . Hydromorphone Nausea And Vomiting and Other (See Comments)    UNK  . Lincomycin Hcl Nausea And Vomiting  . Phenylalanine Nausea And Vomiting    Family History  Problem Relation Age of Onset  . Heart disease Father   . Cancer Father     PANCREAS  . Cancer Sister     BREAST    Prior to Admission medications   Medication Sig Start Date End Date Taking? Authorizing Provider  albuterol (PROVENTIL HFA;VENTOLIN HFA) 108 (90 BASE) MCG/ACT inhaler Inhale 1-2 puffs into the lungs every 6 (six) hours as needed for shortness of breath. 08/29/14  Yes Margarita Rana, MD  amoxicillin (AMOXIL) 500 MG capsule Take 500 mg by mouth 3 (three) times daily. For 7 days. 10/01/14  Yes Historical Provider, MD  cetirizine (ZYRTEC) 10 MG tablet TAKE 1 TABLET EVERY DAY 08/29/14  Yes Margarita Rana, MD  citalopram (CELEXA) 20 MG tablet TAKE 1 TABLET BY MOUTH DAILY Patient taking differently: 0.5 tablet (10mg ) orally once a day. 09/04/14  Yes Margarita Rana, MD  COMBIVENT RESPIMAT 20-100 MCG/ACT AERS respimat USE 2 INHALATIONS EVERY 4 HOURS AS NEEDED 05/20/14  Yes Historical Provider, MD  dicyclomine (BENTYL) 20 MG tablet Take 1 tablet (20 mg total) by mouth 4 (four) times daily as needed for spasms. 08/13/14  Yes Darren Allen Norris, MD  fluticasone (FLONASE) 50 MCG/ACT nasal spray Place 2 sprays into both nostrils daily as needed for allergies or rhinitis.   Yes Historical Provider, MD  gabapentin (NEURONTIN) 300 MG capsule Take 300 mg by mouth 3 (three) times daily.   Yes Historical  Provider, MD  hyoscyamine (LEVSIN, ANASPAZ) 0.125 MG tablet Take 0.125 mg by mouth daily as needed for bladder spasms or cramping.  08/08/14 08/08/15 Yes Historical Provider, MD  montelukast (SINGULAIR) 10 MG tablet Take 1 tablet by mouth at bedtime.    Yes Historical Provider, MD  nortriptyline (PAMELOR) 50 MG capsule Take 1 capsule by mouth daily as needed.  03/12/14  Yes Historical Provider, MD  pantoprazole (PROTONIX) 40 MG tablet TAKE 1 TABLET BY MOUTH TWICE A DAY 10/01/14  Yes Margarita Rana, MD  tapentadol (NUCYNTA) 50 MG TABS tablet Take 1 tablet by mouth 2 (two) times daily. 03/13/14  Yes Historical Provider, MD  estradiol (ESTRACE) 0.5 MG tablet TAKE 1 TABLET EVERY DAY 10/09/14   Margarita Rana, MD   Physical Exam: Filed Vitals:   10/10/14 0700 10/10/14 0853 10/10/14 0920 10/10/14 1540  BP:  110/50  103/55  Pulse:  87  88  Temp:  98.5 F (36.9 C)  98.6 F (37 C)  TempSrc:  Oral  Oral  Resp:  16  16  Height:  Weight: 70.308 kg (155 lb)     SpO2:  91% 90% 93%    Wt Readings from Last 3 Encounters:  10/10/14 70.308 kg (155 lb)  08/21/14 64.411 kg (142 lb)  08/21/14 64.411 kg (142 lb)    General:  Appears anxious Eyes: PERRL, normal lids ENT: grossly normal hearing, lips & tongue Neck: no LAD, masses or thyromegaly Cardiovascular: RRR, no m/r/g Respiratory: CTA bilaterally, no w/r/r Abdomen: soft, nontender Skin: no rash or induration seen Musculoskeletal: grossly normal tone BUE/BLE Psychiatric: anxious Neurologic: grossly non-focal.          Labs on Admission:  Basic Metabolic Panel:  Recent Labs Lab 10/06/14 0826 10/07/14 0525 10/08/14 0425 10/09/14 0542  NA 141 140 140 139  K 3.7 3.6 3.8 3.8  CL 110 113* 112* 111  CO2 24 23 23 25   GLUCOSE 116* 107* 89 95  BUN 10 10 8  5*  CREATININE 0.88 0.73 0.69 0.54  CALCIUM 8.6* 7.6* 7.8* 8.1*   Liver Function Tests:  Recent Labs Lab 10/06/14 0826 10/07/14 0525  AST 19 17  ALT 9* 7*  ALKPHOS 51 43  BILITOT  1.3* 1.1  PROT 6.4* 5.3*  ALBUMIN 3.9 3.1*    Recent Labs Lab 10/06/14 0826  LIPASE 25   No results for input(s): AMMONIA in the last 168 hours. CBC:  Recent Labs Lab 10/06/14 0826 10/07/14 0525 10/08/14 0425 10/09/14 0542  WBC 6.1 3.0* 2.7* 5.5  HGB 12.0 9.8* 9.1* 9.6*  HCT 35.6 29.2* 27.7* 28.1*  MCV 78.3* 79.4* 79.4* 78.5*  PLT 152 90* 86* 96*   Cardiac Enzymes:  Recent Labs Lab 10/06/14 1426  TROPONINI <0.03    BNP (last 3 results) No results for input(s): BNP in the last 8760 hours.  ProBNP (last 3 results) No results for input(s): PROBNP in the last 8760 hours.  CBG: No results for input(s): GLUCAP in the last 168 hours.  Radiological Exams on Admission: Ct Chest Wo Contrast  10/09/2014   CLINICAL DATA:  Hypoxia  EXAM: CT CHEST WITHOUT CONTRAST  TECHNIQUE: Multidetector CT imaging of the chest was performed following the standard protocol without IV contrast.  COMPARISON:  08/02/2012  FINDINGS: Mediastinum/Nodes: Trace pericardial fluid. Great vessels are normal in caliber. Small mediastinal nodes are identified measuring up to 9 mm in short axis diameter in the pretracheal station image 18, not significantly changed. Heart size is normal.  Lungs/Pleura: Moderate pleural effusions are identified. Patchy areas of ill-defined ground-glass pulmonary parenchymal opacity are identified with areas of interlobular septal thickening suggesting edema. Detail is obscured by respiratory motion at multiple levels. Dependent bilateral lower lobe consolidation is identified with internal air bronchogram formation.  Upper abdomen: Adrenal glands appear normal.  Musculoskeletal: No acute osseous abnormality.  IMPRESSION: Moderate pleural effusions with bibasilar consolidation which could indicate atelectasis although superimposed pneumonia could appear similar.  Patchy ground-glass airspace opacities could represent multi lobar pneumonia although edema or other alveolar filling  process could appear similar.   Electronically Signed   By: Conchita Paris M.D.   On: 10/09/2014 17:07   Dg Chest Port 1 View  10/09/2014   CLINICAL DATA:  Shortness of breath.  EXAM: PORTABLE CHEST - 1 VIEW  COMPARISON:  10/06/2014  FINDINGS: Shallow inspiration. Cardiac enlargement with normal appearing pulmonary vascularity. Perihilar and basilar infiltrates bilaterally may represent edema or pneumonia. Small bilateral pleural effusions, greater on the left. No pneumothorax. Mediastinal contours appear intact.  IMPRESSION: Cardiac enlargement. Perihilar and basilar infiltrates suggesting pneumonia  or edema. Small bilateral pleural effusion, greater on the left.   Electronically Signed   By: Lucienne Capers M.D.   On: 10/09/2014 06:35      Assessment/Plan Active Problems:   Diarrhea   Abdominal pain   Hypotension   Clostridium difficile colitis   1. Pulmonary infiltrates -The infiltrates have emerge rather quickly and would be more c/w atelectasis and pulmonary edema along with this she does have a significant bilateral effusions and a cardiac history with increased right sided pressures which would tend to favor a cardiac etiology -await echo results -would diurese as tolerated -try to obtain sputum cultures -if there is no improvement then would consider a bronchoscopy  2. C diff colitis -per GI recommendations  3. Hypotensive episode -resolved  Code Status: full code (must indicate code status--if unknown or must be presumed, indicate so) DVT Prophylaxis:lovenox Family Communication: none (indicate person spoken with, if applicable, with phone number if by telephone)   Time spent: 77min    I have personally obtained a history, examined the patient, evaluated laboratory and imaging results, formulated the assessment and plan and placed orders.  The Patient requires high complexity decision making for assessment and support.    Allyne Gee, MD [facility] Pulmonary Critical  Care Medicine Sleep Medicine

## 2014-10-10 NOTE — Progress Notes (Signed)
[place] [facility] [place] NAME: Erin Richards    MR#:  [id number]  DATE OF BIRTH:  [birth year]  SUBJECTIVE:  No diarrhea today. Now requiring 5-6 liter [place] oxygen. Cough with phlegm+ Wants to go home  REVIEW OF SYSTEMS:   Review of Systems  Constitutional: Negative for fever, chills and weight loss.  HENT: Negative for ear discharge, ear pain and nosebleeds.   Eyes: Negative for blurred vision, pain and discharge.  Respiratory: Positive for cough and shortness of breath. Negative for sputum production, wheezing and stridor.   Cardiovascular: Negative for chest pain, palpitations, orthopnea and PND.  Gastrointestinal: Negative for nausea, vomiting, abdominal pain and diarrhea.  Genitourinary: Negative for urgency and frequency.  Musculoskeletal: Negative for back pain and joint pain.  Neurological: Negative for sensory change, speech change, focal weakness and weakness.  Psychiatric/Behavioral: Negative for depression. The patient is not nervous/anxious.   All other systems reviewed and are negative.  Tolerating Diet:yes  DRUG ALLERGIES:   Allergies  Allergen Reactions  . Acetaminophen-Codeine Nausea And Vomiting  . Aspartame   . Aspartame And Phenylalanine Nausea And Vomiting  . Antiseptic Oral Rinse  [Cetylpyridinium Chloride] Other (See Comments)    UNK  . Biaxin [Clarithromycin] Nausea And Vomiting  . Chlorhexidine Other (See Comments)  . Chlorhexidine Gluconate Nausea And Vomiting  . Clindamycin   . Clindamycin/Lincomycin Nausea And Vomiting  . Codeine   . Dextromethorphan Hbr   . Dilaudid [Hydromorphone Hcl] Nausea And Vomiting  . Doxycycline Nausea And Vomiting  . Fentanyl Nausea And Vomiting  . Germanium Other (See Comments)  . Hydrocodone Nausea And Vomiting  . Ketorolac Other (See Comments)  . Levofloxacin Other (See Comments)    GI upset. GI upset  . Mefenamic Acid Nausea And Vomiting  . Metformin And Related  Nausea And Vomiting  . Metronidazole     GI upset  . Morphine And Related Nausea And Vomiting  . Morphine Sulfate   . Moxifloxacin Swelling  . Moxifloxacin Hcl In Nacl   . Nitrofuran Derivatives   . Nitrofurantoin Nausea And Vomiting and Other (See Comments)  . Nitrofurantoin Monohyd Macro   . Nsaids   . Other Other (See Comments)  . Oxycodone-Acetaminophen Nausea And Vomiting  . Periguard [Dimethicone] Nausea And Vomiting  . Phenothiazines Nausea And Vomiting  . Pioglitazone Nausea And Vomiting  . Quinidine Nausea And Vomiting  . Quinine Derivatives Nausea And Vomiting  . Quinolones Nausea And Vomiting  . Rumex Crispus Other (See Comments)  . Tetracycline Nausea And Vomiting  . Tetracyclines & Related Nausea And Vomiting  . Toradol [Ketorolac Tromethamine] Nausea And Vomiting  . Tramadol Nausea And Vomiting  . Tussin [Guaifenesin] Nausea And Vomiting  . Tussionex Pennkinetic Er [Hydrocod Polst-Cpm Polst Er] Nausea And Vomiting  . Buprenorphine Hcl Nausea And Vomiting  . Hydrocodone-Chlorpheniramine Nausea And Vomiting  . Hydromorphone Nausea And Vomiting and Other (See Comments)    UNK  . Lincomycin Hcl Nausea And Vomiting  . Phenylalanine Nausea And Vomiting    VITALS:  Blood pressure 110/50, pulse 87, temperature 98.5 F (36.9 C), temperature source Oral, resp. rate 16, height 4\' 9"  (1.448 m), weight 70.308 kg (155 lb), SpO2 90 %.  PHYSICAL EXAMINATION:   Physical Exam  GENERAL:  49 y.o.-year-old patient lying in the bed with no acute distress.  EYES: Pupils equal, round, reactive to light and accommodation. No scleral icterus. Extraocular muscles intact.  HEENT: Head atraumatic, normocephalic. Oropharynx and nasopharynx  clear.  NECK:  Supple, no jugular venous distention. No thyroid enlargement, no tenderness.  LUNGS: coarse breath sounds bilaterally, no wheezing, rales, rhonchi. No use of accessory muscles of respiration.  CARDIOVASCULAR: S1, S2 normal. No murmurs,  rubs, or gallops.  ABDOMEN: Soft, nontender, nondistended. Bowel sounds present. No organomegaly or mass.  EXTREMITIES: No cyanosis, clubbing or edema b/l.    NEUROLOGIC: Cranial nerves II through XII are intact. No focal Motor or sensory deficits b/l.   PSYCHIATRIC:  patient is alert and oriented x 3.  SKIN: No obvious rash, lesion, or ulcer.   LABORATORY PANEL:   CBC  Recent Labs Lab 10/09/14 0542  WBC 5.5  HGB 9.6*  HCT 28.1*  PLT 96*    Chemistries   Recent Labs Lab 10/07/14 0525  10/09/14 0542  NA 140  < > 139  K 3.6  < > 3.8  CL 113*  < > 111  CO2 23  < > 25  GLUCOSE 107*  < > 95  BUN 10  < > 5*  CREATININE 0.73  < > 0.54  CALCIUM 7.6*  < > 8.1*  AST 17  --   --   ALT 7*  --   --   ALKPHOS 43  --   --   BILITOT 1.1  --   --   < > = values in this interval not displayed.  Cardiac Enzymes  Recent Labs Lab 10/06/14 1426  TROPONINI <0.03    RADIOLOGY:  Ct Chest Wo Contrast  10/09/2014   CLINICAL DATA:  Hypoxia  EXAM: CT CHEST WITHOUT CONTRAST  TECHNIQUE: Multidetector CT imaging of the chest was performed following the standard protocol without IV contrast.  COMPARISON:  08/02/2012  FINDINGS: Mediastinum/Nodes: Trace pericardial fluid. Great vessels are normal in caliber. Small mediastinal nodes are identified measuring up to 9 mm in short axis diameter in the pretracheal station image 18, not significantly changed. Heart size is normal.  Lungs/Pleura: Moderate pleural effusions are identified. Patchy areas of ill-defined ground-glass pulmonary parenchymal opacity are identified with areas of interlobular septal thickening suggesting edema. Detail is obscured by respiratory motion at multiple levels. Dependent bilateral lower lobe consolidation is identified with internal air bronchogram formation.  Upper abdomen: Adrenal glands appear normal.  Musculoskeletal: No acute osseous abnormality.  IMPRESSION: Moderate pleural effusions with bibasilar consolidation which  could indicate atelectasis although superimposed pneumonia could appear similar.  Patchy ground-glass airspace opacities could represent multi lobar pneumonia although edema or other alveolar filling process could appear similar.   Electronically Signed   By: Conchita Paris M.D.   On: 10/09/2014 17:07   Dg Chest Port 1 View  10/09/2014   CLINICAL DATA:  Shortness of breath.  EXAM: PORTABLE CHEST - 1 VIEW  COMPARISON:  10/06/2014  FINDINGS: Shallow inspiration. Cardiac enlargement with normal appearing pulmonary vascularity. Perihilar and basilar infiltrates bilaterally may represent edema or pneumonia. Small bilateral pleural effusions, greater on the left. No pneumothorax. Mediastinal contours appear intact.  IMPRESSION: Cardiac enlargement. Perihilar and basilar infiltrates suggesting pneumonia or edema. Small bilateral pleural effusion, greater on the left.   Electronically Signed   By: Lucienne Capers M.D.   On: 10/09/2014 06:35     ASSESSMENT AND PLAN:   1. 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 With Abdominal pain. Continue Oral vancomycin every 6 hours for a total of 2 weeks. Patient prefers following up with Dr. Vira Agar before discharge regarding stoolransplantation Would like to see diarrhea slowed  down prior to discharge home. -will change to po falgyl since vanc syrup is causing nausea 2. Relative hypotension- continue IV fluid hydration. 3. Gastroesophageal reflux disease without esophagitis on Protonix 4. Acute respiratory failure- suspected due to multibar pneumonia vs volume overload -CT chest noted-IV lasix 40 mg x1-IV rocephin - Chest x-ray negative. pulse ox is 95+ on 5 L  -pulmonary consult with Dr Humphrey Rolls, incentive spirometry -given her TONS of allergy to meds cannot broaden her abx coverage -will need oxygen to go home with 5.history of irritable bowel syndrome - will provide Bentyl as needed basis   Case discussed with Care  Management/Social Worker. Management plans discussed with the patient, family and they are in agreement.  CODE STATUS: full  DVT Prophylaxis: heparin  TOTAL TIME TAKING CARE OF THIS PATIENT: 30inutes.  >50% time spent on counselling and coordination of care  POSSIBLE D/C IN 2-3YS, DEPENDING ON CLINICAL CONDITION.   Meryn Sarracino M.D on 10/10/2014 at 1:24 PM  Between 7am to 6pm - Pager - [phone]  After 6pm go to [email or web address] - password [email or web address] [facility]  [place] [facility]  Office  [phone]  CC: Primary care physician; Margarita Rana, MD

## 2014-10-10 NOTE — Progress Notes (Signed)
*  PRELIMINARY RESULTS* Echocardiogram 2D Echocardiogram has been performed.  Laqueta Jean Hege 10/10/2014, 10:37 AM

## 2014-10-10 NOTE — Consult Note (Signed)
Flagyl  May be inferior to vanc and Dificid.  Recommend change to Dificid due to her C. Diff diarrhea.  Spoke to Dr, Posey Pronto and will order this.  May need stool transplant but given her lung problems she will need treatment for this and will need to extend the Dificid for a week after the lung antibiotic.  Will follow with you.

## 2014-10-11 LAB — CULTURE, BLOOD (ROUTINE X 2)
Culture: NO GROWTH
Culture: NO GROWTH

## 2014-10-11 MED ORDER — FUROSEMIDE 10 MG/ML IJ SOLN: 40.0000 mg | Freq: Every day | INTRAMUSCULAR | Status: DC

## 2014-10-11 MED ORDER — FUROSEMIDE 10 MG/ML IJ SOLN
20.0000 mg | Freq: Two times a day (BID) | INTRAMUSCULAR | Status: DC
  Administered 2014-10-11: 20 mg via INTRAVENOUS
  Filled 2014-10-11: qty 2

## 2014-10-11 MED ORDER — FUROSEMIDE 10 MG/ML IJ SOLN: 20.0000 mg | INTRAMUSCULAR | Status: DC

## 2014-10-11 MED ORDER — FUROSEMIDE 10 MG/ML IJ SOLN
20.0000 mg | Freq: Once | INTRAMUSCULAR | Status: AC
  Administered 2014-10-11: 20 mg via INTRAVENOUS
  Filled 2014-10-11: qty 2

## 2014-10-11 MED ORDER — FUROSEMIDE 10 MG/ML IJ SOLN: 20.0000 mg | Freq: Two times a day (BID) | INTRAMUSCULAR | Status: DC

## 2014-10-11 NOTE — Consult Note (Signed)
Pt says she has not had a bowel movement or diarrhea for 2 days.  She is due to get amoxicillin on 9/12 if she goes to the dentist for further dental work.  She will need to extend her Dificid to cover at least 7 days after the Amoxicillin to have the best chance of the C. Diff not returning.  No abd pain or tenderness today.  Still on high flow oxygen.  I will not be here tomorrow and Dr. Rayann Heman is on call but will not see pt unless she has a GI problem which you can call him about.

## 2014-10-11 NOTE — Clinical Documentation Improvement (Signed)
Family Medicine  Based on the clinical findings below, please document any associated diagnoses/conditions the patient has or may have.   Other  Clinically Undetermined  Supporting Information:  Patient is leukopenic with WBC of 3.0, 2.7  Patient is anemic with H&H of 10/03/25.7  Patient has thrombocytopenic with plates of 90, 86  Please exercise your independent, professional judgment when responding. A specific answer is not anticipated or expected.  Thank You, Zoila Shutter BSN, [place] [phone]

## 2014-10-11 NOTE — Progress Notes (Signed)
[facility] NAME: Erin Richards    MR#:  [id number]  DATE OF BIRTH:  [birth year]  SUBJECTIVE:  No diarrhea today. Now requiring 5-6 liter [place] oxygen.  Wants to go home  REVIEW OF SYSTEMS:   Review of Systems  Constitutional: Negative for fever, chills and weight loss.  HENT: Negative for ear discharge, ear pain and nosebleeds.   Eyes: Negative for blurred vision, pain and discharge.  Respiratory: Positive for cough and shortness of breath. Negative for sputum production, wheezing and stridor.   Cardiovascular: Negative for chest pain, palpitations, orthopnea and PND.  Gastrointestinal: Negative for nausea, vomiting, abdominal pain and diarrhea.  Genitourinary: Negative for urgency and frequency.  Musculoskeletal: Negative for back pain and joint pain.  Neurological: Negative for sensory change, speech change, focal weakness and weakness.  Psychiatric/Behavioral: Negative for depression. The patient is not nervous/anxious.   All other systems reviewed and are negative.  Tolerating Diet:yes  DRUG ALLERGIES:   Allergies  Allergen Reactions  . Acetaminophen-Codeine Nausea And Vomiting  . Aspartame   . Aspartame And Phenylalanine Nausea And Vomiting  . Antiseptic Oral Rinse  [Cetylpyridinium Chloride] Other (See Comments)    UNK  . Biaxin [Clarithromycin] Nausea And Vomiting  . Chlorhexidine Other (See Comments)  . Chlorhexidine Gluconate Nausea And Vomiting  . Clindamycin   . Clindamycin/Lincomycin Nausea And Vomiting  . Codeine   . Dextromethorphan Hbr   . Dilaudid [Hydromorphone Hcl] Nausea And Vomiting  . Doxycycline Nausea And Vomiting  . Fentanyl Nausea And Vomiting  . Germanium Other (See Comments)  . Hydrocodone Nausea And Vomiting  . Ketorolac Other (See Comments)  . Levofloxacin Other (See Comments)    GI upset. GI upset  . Mefenamic Acid Nausea And Vomiting  . Metformin And Related Nausea And Vomiting   . Metronidazole     GI upset  . Morphine And Related Nausea And Vomiting  . Morphine Sulfate   . Moxifloxacin Swelling  . Moxifloxacin Hcl In Nacl   . Nitrofuran Derivatives   . Nitrofurantoin Nausea And Vomiting and Other (See Comments)  . Nitrofurantoin Monohyd Macro   . Nsaids   . Other Other (See Comments)  . Oxycodone-Acetaminophen Nausea And Vomiting  . Periguard [Dimethicone] Nausea And Vomiting  . Phenothiazines Nausea And Vomiting  . Pioglitazone Nausea And Vomiting  . Quinidine Nausea And Vomiting  . Quinine Derivatives Nausea And Vomiting  . Quinolones Nausea And Vomiting  . Rumex Crispus Other (See Comments)  . Tetracycline Nausea And Vomiting  . Tetracyclines & Related Nausea And Vomiting  . Toradol [Ketorolac Tromethamine] Nausea And Vomiting  . Tramadol Nausea And Vomiting  . Tussin [Guaifenesin] Nausea And Vomiting  . Tussionex Pennkinetic Er [Hydrocod Polst-Cpm Polst Er] Nausea And Vomiting  . Buprenorphine Hcl Nausea And Vomiting  . Hydrocodone-Chlorpheniramine Nausea And Vomiting  . Hydromorphone Nausea And Vomiting and Other (See Comments)    UNK  . Lincomycin Hcl Nausea And Vomiting  . Phenylalanine Nausea And Vomiting    VITALS:  Blood pressure 110/61, pulse 88, temperature 97.6 F (36.4 C), temperature source Oral, resp. rate 16, height 4\' 9"  (1.448 m), weight 70.308 kg (155 lb), SpO2 88 %.  PHYSICAL EXAMINATION:   Physical Exam  GENERAL:  49 y.o.-year-old patient lying in the bed with no acute distress.  EYES: Pupils equal, round, reactive to light and accommodation. No scleral icterus. Extraocular muscles intact.  HEENT: Head atraumatic, normocephalic. Oropharynx and nasopharynx clear.  NECK:  Supple, no jugular venous distention. No thyroid enlargement, no tenderness.  LUNGS: coarse breath sounds bilaterally, no wheezing, rales, rhonchi. No use of accessory muscles of respiration.  CARDIOVASCULAR: S1, S2 normal. No murmurs, rubs, or gallops.   ABDOMEN: Soft, nontender, nondistended. Bowel sounds present. No organomegaly or mass.  EXTREMITIES: No cyanosis, clubbing or edema b/l.    NEUROLOGIC: Cranial nerves II through XII are intact. No focal Motor or sensory deficits b/l.   PSYCHIATRIC:  patient is alert and oriented x 3.  SKIN: No obvious rash, lesion, or ulcer.   LABORATORY PANEL:   CBC  Recent Labs Lab 10/09/14 0542  WBC 5.5  HGB 9.6*  HCT 28.1*  PLT 96*    Chemistries   Recent Labs Lab 10/07/14 0525  10/09/14 0542  NA 140  < > 139  K 3.6  < > 3.8  CL 113*  < > 111  CO2 23  < > 25  GLUCOSE 107*  < > 95  BUN 10  < > 5*  CREATININE 0.73  < > 0.54  CALCIUM 7.6*  < > 8.1*  AST 17  --   --   ALT 7*  --   --   ALKPHOS 43  --   --   BILITOT 1.1  --   --   < > = values in this interval not displayed.  Cardiac Enzymes  Recent Labs Lab 10/06/14 1426  TROPONINI <0.03    RADIOLOGY:  Ct Chest Wo Contrast  10/09/2014   CLINICAL DATA:  Hypoxia  EXAM: CT CHEST WITHOUT CONTRAST  TECHNIQUE: Multidetector CT imaging of the chest was performed following the standard protocol without IV contrast.  COMPARISON:  08/02/2012  FINDINGS: Mediastinum/Nodes: Trace pericardial fluid. Great vessels are normal in caliber. Small mediastinal nodes are identified measuring up to 9 mm in short axis diameter in the pretracheal station image 18, not significantly changed. Heart size is normal.  Lungs/Pleura: Moderate pleural effusions are identified. Patchy areas of ill-defined ground-glass pulmonary parenchymal opacity are identified with areas of interlobular septal thickening suggesting edema. Detail is obscured by respiratory motion at multiple levels. Dependent bilateral lower lobe consolidation is identified with internal air bronchogram formation.  Upper abdomen: Adrenal glands appear normal.  Musculoskeletal: No acute osseous abnormality.  IMPRESSION: Moderate pleural effusions with bibasilar consolidation which could indicate  atelectasis although superimposed pneumonia could appear similar.  Patchy ground-glass airspace opacities could represent multi lobar pneumonia although edema or other alveolar filling process could appear similar.   Electronically Signed   By: Conchita Paris M.D.   On: 10/09/2014 17:07     ASSESSMENT AND PLAN:   1. 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 With Abdominal pain. Continue Oral vancomycin every 6 hours for a total of 2 weeks. Patient prefers following up with Dr. Vira Agar before discharge regarding stoolransplantation Would like to see diarrhea slowed down prior to discharge home. -will change to po Dificid since vanc syrup is causing nausea  2. Relative hypotension- resolved 3. Gastroesophageal reflux disease without esophagitis on Protonix 4. Acute respiratory failure- suspected due to CHF,acute diastolic/ volume overload in the setting of h/o nonrheumatic pulmonary valve stenosis, mild pulmonary hypertension to moderate pulmonary hypertension, status post cleft mitral valve repair  -CT chest noted-IV lasix 20 mg bid- Chest x-ray negative on admission. pulse ox is 95+ on 5 L  -pulmonary consult with Dr Humphrey Rolls, incentive spirometry -d/c rocephin -will need oxygen to go home with -seen by  Dr Ubaldo Glassing appreciate input  5.history of irritable bowel syndrome - will provide Bentyl as needed basis   Case discussed with Care Management/Social Worker. Management plans discussed with the patient, family and they are in agreement.  CODE STATUS: full  DVT Prophylaxis: heparin  TOTAL TIME TAKING CARE OF THIS PATIENT: 30inutes.  >50% time spent on counselling and coordination of care  POSSIBLE D/C IN 2-3YS, DEPENDING ON CLINICAL CONDITION.   Whitfield Dulay M.D on 10/11/2014 at 12:57 PM  Between 7am to 6pm - Pager - [phone]  After 6pm go to [email or web address] - password [email or web address] [facility]  [facility]  Office  [phone]  CC: Primary care  physician; Margarita Rana, MD

## 2014-10-11 NOTE — Consult Note (Signed)
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 Most recent echocardiogram 8/15 during hospitalization for abdominal pain, revealed LV ejection fraction of 60-65% with right ventricular systolic pressure of 51 mm Hg.  She was admitted with diarrhea weakness and fatigue.  She also has a history of a lower GI bleed.  Chest x-ray on admission revealed no abnormalities.  Repeat chest x-ray suggested bilateral pleural effusions.  She has required up to 6 L of oxygen.  Echocardiogram revealed normal LV function with EF of 60-65% with no significant valvular abnormalities.  There was mild TR.  Mildly dilated right ventricle. The patient has chronic abdominal pain, has been told she has colitis, followed by Dr. Vira Agar .  She denies chest pain.  She denies orthopnea or PND.  She is lying flat in bed with 6 L of nasal cannula oxygen.  She states the diarrhea is improved.  Past Medical and Surgical History  Past Medical History Past Medical History  Diagnosis Date  . Headache 07/03/2013  . Dizziness 07/03/2013  . Concussion 07/03/2013  . Neck pain 07/03/2013  . Extremity pain 07/03/2013  . Internal hemorrhoids 2011  . Irritable bowel syndrome  . Heart murmur  . Abdominal pain, acute, left lower quadrant 07/25/2013  . Constipation 07/25/2013   Past Surgical History She has past surgical history that includes ASD repair; Colonoscopy (05/22/2005); and Colonoscopy (12/30/2009).   Medications and Allergies    Current Outpatient Prescriptions  Medication Sig Dispense Refill  . albuterol 90 mcg/actuation inhaler Inhale 2 inhalations into the lungs every 6 (six) hours as needed for Wheezing.  Marland Kitchen amoxicillin-clavulanate (AUGMENTIN) 500-125 mg tablet Take 1 tablet by mouth 2 (two) times daily.  Marland Kitchen azelastine (ASTELIN) 137 mcg nasal spray 1 spray by Nasal route.   . cefdinir (OMNICEF) 300 mg capsule Take 1 capsule (300 mg total) by mouth 2 (two) times daily. 60 capsule 3  . celecoxib (CELEBREX) 100 MG capsule Take 1 capsule (100 mg total) by mouth 2 (two) times daily. 60 capsule 2  . cetirizine (ZYRTEC) 10 mg capsule Take 10 mg by mouth once daily.  . citalopram (CELEXA) 40 MG tablet Take 20 mg by mouth once daily.  . diazepam (VALIUM) 5 MG tablet 5mg  1-2 30 minutes before ESI #2 2 tablet 0  . dicyclomine (BENTYL) 20 mg tablet 2  . estradiol (ESTRACE) 0.5 MG tablet Take 0.5 mg by mouth once daily.  . flunisolide (NASAREL) 29 mcg (0.025 %) nasal spray Place 2 sprays into both nostrils once daily as needed.  . fluticasone (FLONASE) 50 mcg/actuation nasal spray Place 2 sprays into both nostrils once daily as needed.  . gabapentin (NEURONTIN) 100 MG capsule Take 1 pill in the morning, 1 pill in the afternoon, 2 pills at night 120 capsule 1  . gabapentin (NEURONTIN) 300 MG capsule 1 po bid x 4 days, then tid thereafter. 60 capsule 5  . ipratropium (ATROVENT) 0.03 % nasal spray Place 2 sprays into both nostrils once daily as needed.  Marland Kitchen ipratropium-albuterol (COMBIVENT) 18-103 mcg/actuation inhaler Inhale 2 inhalations into the lungs 4 (four) times daily as needed for Wheezing.  . methocarbamol (ROBAXIN) 500 MG tablet Take 1 tablet (500 mg total) by mouth 3 (three) times daily. 90 tablet 5  . methylPREDNISolone (MEDROL) 4 MG tablet Day one - 6 tab Day two - 5 tab Day three -  4 tabs Day four - 3 tabs Day five - 2 tabs Day six - 1 tab 21 tablet 0  . montelukast (SINGULAIR) 10 mg tablet Take 10 mg by mouth once daily as needed.  . nortriptyline (PAMELOR) 50 MG capsule Take 1 capsule (50 mg total) by mouth nightly. 30 capsule 3  . pantoprazole (PROTONIX) 40 MG DR tablet Take 40 mg by mouth once daily.  . tapentadol (NUCYNTA) 50 mg tablet 1 po bid prn 60 tablet 0      Allergies: Aspartame; Avelox; Chlorhexidine; Clindamycin; Clindamycin hcl; Codeine; Colox;  Dextromethorphan hbr; Doxycycline; Fentanyl; Flagyl; Hydrocodone; Hydromorphone (bulk); Ketorolac; Levaquin; Macrobid; Metformin; Morphine; Nitrofuran analogues; Other; Oxycodone; Percocet; Periogard; Phenothiazines; Pioglitazone; Ponstel; Quinolones; Tetracyclines; Tylenol w codeine; Ultram; Vibramycin; Vit a-d3-e-aloe vera-zinc; [place] (rumex crispus); Biaxin; Buprenorphine hcl; Doxycycline monohydrate; Hydromorphone; Lincomycin hcl; Nitrofurantoin; Phenylalanine; Quinidine polygalacturonate; and Tussionex  Social and Family History  Social History reports that she has never smoked. She has never used smokeless tobacco. She reports that she does not drink alcohol or use illicit drugs.  Family History Family History  Problem Relation Age of Onset  . No Known Problems Mother  . Pancreatic cancer Father  . Breast cancer Sister  . No Known Problems Sister  . No Known Problems Son  . No Known Problems Son   Review of Systems   Review of Systems: The patient denies chest pain, reports mild chronic exertional shortness of breath, without orthopnea, paroxysmal nocturnal dyspnea, pedal edema, palpitations, heart racing, presyncope, syncope. Review of 12 Systems is negative except as described above.  She does complain of diarrhea.  Physical Examination   Vitals:BP 98/60 mmHg  Pulse 62  Ht 147.3 cm (4\' 10" )  Wt 63.231 kg (139 lb 6.4 oz)  BMI 29.14 kg/m2 Ht:147.3 cm (4\' 10" ) Wt:63.231 kg (139 lb 6.4 oz) ZSW:FUXN surface area is 1.61 meters squared. Body mass index is 29.14 kg/(m^2).  HEENT: Pupils equally reactive to light and accomodation  Neck: Supple without thyromegaly, carotid pulses 2+ Lungs: clear to auscultation bilaterally; no wheezes, rales, rhonchi Heart: Regular rate and rhythm. Grade 1/6 systolic murmur Abdomen: soft nontender, nondistended, with normal bowel sounds Extremities: no cyanosis, clubbing, or edema Peripheral Pulses: 2+ in all extremities, 2+ femoral pulses  bilaterally  Assessment     Patient with history nonrheumatic pulmonary valve stenosis, mild pulmonary hypertension to moderate pulmonary hypertension, status post cleft mitral valve repair who presents to the ER and was admitted with diarrhea.  She has developed some evidence of volume overload.  She is currently not critically in heart failure as she is lying flat that is required 6 L of nasal cannula oxygen.  Echo revealed similar findings to 1 done as an outpatient last year.  Her LV function is preserved.  Will continue the carefully diurese continue to attempt to  Wean oxygen.  Patient is ruled out from myocardia infarction.

## 2014-10-12 ENCOUNTER — Inpatient Hospital Stay: Payer: Medicaid Other

## 2014-10-12 LAB — LEGIONELLA PNEUMOPHILA SEROGP 1 UR AG: L. PNEUMOPHILA SEROGP 1 UR AG: NEGATIVE

## 2014-10-12 MED ORDER — FUROSEMIDE 40 MG PO TABS: 40.0000 mg | ORAL_TABLET | Freq: Every day | ORAL | Status: DC

## 2014-10-12 MED ORDER — FUROSEMIDE 40 MG PO TABS
40.0000 mg | ORAL_TABLET | Freq: Every day | ORAL | Status: DC
  Administered 2014-10-12: 40 mg via ORAL
  Filled 2014-10-12: qty 1

## 2014-10-12 MED ORDER — FIDAXOMICIN 200 MG PO TABS: 200.0000 mg | ORAL_TABLET | Freq: Two times a day (BID) | ORAL | Status: DC

## 2014-10-12 NOTE — Discharge Instructions (Signed)
Use your oxygen as directed Yoghurt in your diet

## 2014-10-12 NOTE — Care Management (Signed)
Spoke with patient and significant other. Patient will be going to stay with her significant other at [address], [place] [address].  Stated that her home is being sold. Gave choice of providers for Home Health, and patient chose [facility]. Patient will go home with home O2, RN andTelehealth monitoring. New CHF DX.  Referals placed with Floydene Flock and Will Anderson at [facility] for nursing and oxygen services. Discharge today.

## 2014-10-12 NOTE — Progress Notes (Signed)
   10/11/14 1444  Oxygen Therapy  SpO2 (!) 69 %  O2 Device Room Air  Without Excertion

## 2014-10-12 NOTE — Progress Notes (Signed)
[place]  SUBJECTIVE: "I want to go home. I think i can breath without oxygen". Diuresed 2 liters last 24 hours. Still relatively hypoxic off of oxygen. May need at home   Filed Vitals:   10/11/14 1600 10/12/14 0049 10/12/14 0500 10/12/14 0803  BP: 97/56 98/52  95/45  Pulse: 94 87  80  Temp: 98.2 F (36.8 C) 98.4 F (36.9 C)  98.2 F (36.8 C)  TempSrc: Oral   Oral  Resp: 18 20    Height:      Weight:   67.132 kg (148 lb)   SpO2: 92% 91%  91%    Intake/Output Summary (Last 24 hours) at 10/12/14 0814 Last data filed at 10/11/14 2220  Gross per 24 hour  Intake    300 ml  Output   2450 ml  Net  -2150 ml    LABS: Basic Metabolic Panel: No results for input(s): NA, K, CL, CO2, GLUCOSE, BUN, CREATININE, CALCIUM, MG, PHOS in the last 72 hours. Liver Function Tests: No results for input(s): AST, ALT, ALKPHOS, BILITOT, PROT, ALBUMIN in the last 72 hours. No results for input(s): LIPASE, AMYLASE in the last 72 hours. CBC: No results for input(s): WBC, NEUTROABS, HGB, HCT, MCV, PLT in the last 72 hours. Cardiac Enzymes: No results for input(s): CKTOTAL, CKMB, CKMBINDEX, TROPONINI in the last 72 hours. BNP: Invalid input(s): POCBNP D-Dimer: No results for input(s): DDIMER in the last 72 hours. Hemoglobin A1C: No results for input(s): HGBA1C in the last 72 hours. Fasting Lipid Panel: No results for input(s): CHOL, HDL, LDLCALC, TRIG, CHOLHDL, LDLDIRECT in the last 72 hours. Thyroid Function Tests: No results for input(s): TSH, T4TOTAL, T3FREE, THYROIDAB in the last 72 hours.  Invalid input(s): FREET3 Anemia Panel: No results for input(s): VITAMINB12, FOLATE, FERRITIN, TIBC, IRON, RETICCTPCT in the last 72 hours.   Physical Exam: Blood pressure 95/45, pulse 80, temperature 98.2 F (36.8 C), temperature source Oral, resp. rate 20, height 4\' 9"  (1.448 m), weight 67.132 kg (148 lb), SpO2 91 %.    General appearance: alert and  cooperative Resp: normal percussion bilaterally Chest wall: no tenderness Cardio: regular rate and rhythm and systolic murmur: late systolic 2/6, medium pitch at lower left sternal border GI: soft, non-tender; bowel sounds normal; no masses,  no organomegaly Extremities: extremities normal, atraumatic, no cyanosis or edema Neurologic: Grossly normal  TELEMETRY: Reviewed telemetry pt in sinus rhythm and sinus tachycardia:  ASSESSMENT AND PLAN:  Active Problems:   Diarrhea-improved somewhat.    Abdominal pain   Hypotension   Clostridium difficile colitis-improved Hypoxia-cxr improved with lest puomonary edema and effusion. Would continue with po diuresis as outpatient. OK for discharge from cardiac standpoint with early follow up with Dr. Saralyn Pilar next week. Will change to furosemide 40 mg daily.     Teodoro Spray., MD, [facility] 10/12/2014 8:14 AM

## 2014-10-12 NOTE — Progress Notes (Signed)
Patient discharged to home as ordered. IV site discontinued Patient instructed to wear oxygen as ordered, and to keep follow up appointments as ordered. Patient to receive home health and oxygen tank arrived to the floor prior to discharge and oxygen is also scheduled to be delivered to the house per case manager.

## 2014-10-12 NOTE — Discharge Summary (Signed)
[facility] NAME: Erin Richards    MR#:  [id number]  DATE OF BIRTH:  [birth year]  DATE OF ADMISSION:  10/06/2014 ADMITTING PHYSICIAN: Idelle Crouch, MD  DATE OF DISCHARGE: 10/12/14  PRIMARY CARE PHYSICIAN: Margarita Rana, MD    ADMISSION DIAGNOSIS:  Diarrhea [R19.7] Generalized abdominal pain [R10.84]  DISCHARGE DIAGNOSIS:  * recurrent C difficile *CHF,acute,diastolic (new) * Hypoxia due to CHF with bilateral pleural effusions requiring oxygen -h/o congenital heart disease  SECONDARY DIAGNOSIS:   Past Medical History  Diagnosis Date  . IBS (irritable bowel syndrome)   . 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 Allergy   . Depression     HOSPITAL COURSE:  1. 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  -changeg to po Dificid since vanc syrup is causing nausea. Diarrhea resolved  2. Relative hypotension- resolved 3. Gastroesophageal reflux disease without esophagitis on Protonix (hold till c diff resolves) 4. Acute respiratory failure- suspected due to CHF,acute diastolic/ volume overload in the setting of h/o nonrheumatic pulmonary valve stenosis, mild pulmonary hypertension to moderate pulmonary hypertension, status post cleft mitral valve repair 5. -diuresed about 2.2 liters over 24 hrs. Change to po lasix. D/w dr Ubaldo Glassing. Ok to go home - Chest x-ray negative on admission. pulse ox is 95+ on 5 L repeat CXR shows improvement -pulmonary consult with Dr Humphrey Rolls, incentive spirometry -d/c rocephin -will need oxygen to go home with -with new CHF dx will get [facility] RN and tele -monitoring at home  5.history of irritable bowel syndrome - will provide Bentyl as needed basis   -overall improving slowly  DISCHARGE CONDITIONS:   fiar CONSULTS OBTAINED:  Treatment Team:  Nicholes Mango, MD Allyne Gee, MD Teodoro Spray, MD  DRUG ALLERGIES:   Allergies  Allergen Reactions   . Acetaminophen-Codeine Nausea And Vomiting  . Aspartame   . Aspartame And Phenylalanine Nausea And Vomiting  . Antiseptic Oral Rinse  [Cetylpyridinium Chloride] Other (See Comments)    UNK  . Biaxin [Clarithromycin] Nausea And Vomiting  . Chlorhexidine Other (See Comments)  . Chlorhexidine Gluconate Nausea And Vomiting  . Clindamycin   . Clindamycin/Lincomycin Nausea And Vomiting  . Codeine   . Dextromethorphan Hbr   . Dilaudid [Hydromorphone Hcl] Nausea And Vomiting  . Doxycycline Nausea And Vomiting  . Fentanyl Nausea And Vomiting  . Germanium Other (See Comments)  . Hydrocodone Nausea And Vomiting  . Ketorolac Other (See Comments)  . Levofloxacin Other (See Comments)    GI upset. GI upset  . Mefenamic Acid Nausea And Vomiting  . Metformin And Related Nausea And Vomiting  . Metronidazole     GI upset  . Morphine And Related Nausea And Vomiting  . Morphine Sulfate   . Moxifloxacin Swelling  . Moxifloxacin Hcl In Nacl   . Nitrofuran Derivatives   . Nitrofurantoin Nausea And Vomiting and Other (See Comments)  . Nitrofurantoin Monohyd Macro   . Nsaids   . Other Other (See Comments)  . Oxycodone-Acetaminophen Nausea And Vomiting  . Periguard [Dimethicone] Nausea And Vomiting  . Phenothiazines Nausea And Vomiting  . Pioglitazone Nausea And Vomiting  . Quinidine Nausea And Vomiting  . Quinine Derivatives Nausea And Vomiting  . Quinolones Nausea And Vomiting  . Rumex Crispus Other (See Comments)  . Tetracycline Nausea And Vomiting  . Tetracyclines & Related Nausea And Vomiting  . Toradol [Ketorolac Tromethamine] Nausea And Vomiting  . Tramadol Nausea  And Vomiting  . Tussin [Guaifenesin] Nausea And Vomiting  . Tussionex Pennkinetic Er [Hydrocod Polst-Cpm Polst Er] Nausea And Vomiting  . Buprenorphine Hcl Nausea And Vomiting  . Hydrocodone-Chlorpheniramine Nausea And Vomiting  . Hydromorphone Nausea And Vomiting and Other (See Comments)    UNK  . Lincomycin Hcl Nausea  And Vomiting  . Phenylalanine Nausea And Vomiting    DISCHARGE MEDICATIONS:   Current Discharge Medication List    START taking these medications   Details  fidaxomicin (DIFICID) 200 MG TABS tablet Take 1 tablet (200 mg total) by mouth 2 (two) times daily. Qty: 20 tablet, Refills: 0    furosemide (LASIX) 40 MG tablet Take 1 tablet (40 mg total) by mouth daily. Qty: 30 tablet, Refills: 0      CONTINUE these medications which have NOT CHANGED   Details  albuterol (PROVENTIL HFA;VENTOLIN HFA) 108 (90 BASE) MCG/ACT inhaler Inhale 1-2 puffs into the lungs every 6 (six) hours as needed for shortness of breath. Qty: 1 Inhaler, Refills: 3    cetirizine (ZYRTEC) 10 MG tablet TAKE 1 TABLET EVERY DAY Qty: 30 tablet, Refills: 6   Associated Diagnoses: Allergic rhinitis, unspecified allergic rhinitis type    citalopram (CELEXA) 20 MG tablet TAKE 1 TABLET BY MOUTH DAILY Qty: 30 tablet, Refills: 5   Associated Diagnoses: Clinical depression    COMBIVENT RESPIMAT 20-100 MCG/ACT AERS respimat USE 2 INHALATIONS EVERY 4 HOURS AS NEEDED Refills: 5    dicyclomine (BENTYL) 20 MG tablet Take 1 tablet (20 mg total) by mouth 4 (four) times daily as needed for spasms. Qty: 31 tablet, Refills: 2    fluticasone (FLONASE) 50 MCG/ACT nasal spray Place 2 sprays into both nostrils daily as needed for allergies or rhinitis.    gabapentin (NEURONTIN) 300 MG capsule Take 300 mg by mouth 3 (three) times daily.    hyoscyamine (LEVSIN, ANASPAZ) 0.125 MG tablet Take 0.125 mg by mouth daily as needed for bladder spasms or cramping.     montelukast (SINGULAIR) 10 MG tablet Take 1 tablet by mouth at bedtime.     nortriptyline (PAMELOR) 50 MG capsule Take 1 capsule by mouth daily as needed.     tapentadol (NUCYNTA) 50 MG TABS tablet Take 1 tablet by mouth 2 (two) times daily.    estradiol (ESTRACE) 0.5 MG tablet TAKE 1 TABLET EVERY DAY Qty: 90 tablet, Refills: 3   Associated Diagnoses: Post menopausal  syndrome      STOP taking these medications     amoxicillin (AMOXIL) 500 MG capsule      pantoprazole (PROTONIX) 40 MG tablet         If you experience worsening of your admission symptoms, develop shortness of breath, life threatening emergency, suicidal or homicidal thoughts you must seek medical attention immediately by calling 911 or calling your MD immediately  if symptoms less severe.  You Must read complete instructions/literature along with all the possible adverse reactions/side effects for all the Medicines you take and that have been prescribed to you. Take any new Medicines after you have completely understood and accept all the possible adverse reactions/side effects.   Please note  You were cared for by a hospitalist during your hospital stay. If you have any questions about your discharge medications or the care you received while you were in the hospital after you are discharged, you can call the unit and asked to speak with the hospitalist on call if the hospitalist that took care of you is not available. Once you  are discharged, your primary care physician will handle any further medical issues. Please note that NO REFILLS for any discharge medications will be authorized once you are discharged, as it is imperative that you return to your primary care physician (or establish a relationship with a primary care physician if you do not have one) for your aftercare needs so that they can reassess your need for medications and monitor your lab values. Today   SUBJECTIVE  Wants to go home   VITAL SIGNS:  Blood pressure 95/45, pulse 80, temperature 98.2 F (36.8 C), temperature source Oral, resp. rate 18, height 4\' 9"  (1.448 m), weight 67.132 kg (148 lb), SpO2 91 %.  I/O:   Intake/Output Summary (Last 24 hours) at 10/12/14 0921 Last data filed at 10/12/14 0803  Gross per 24 hour  Intake    300 ml  Output   2450 ml  Net  -2150 ml    PHYSICAL EXAMINATION:  GENERAL:   49 y.o.-year-old patient lying in the bed with no acute distress.  EYES: Pupils equal, round, reactive to light and accommodation. No scleral icterus. Extraocular muscles intact.  HEENT: Head atraumatic, normocephalic. Oropharynx and nasopharynx clear.  NECK:  Supple, no jugular venous distention. No thyroid enlargement, no tenderness.  LUNGS: decreased breath sounds bilaterally, no wheezing, rales,rhonchi or crepitation. No use of accessory muscles of respiration.  CARDIOVASCULAR: S1, S2 normal. No murmurs, rubs, or gallops.  ABDOMEN: Soft, non-tender, non-distended. Bowel sounds present. No organomegaly or mass.  EXTREMITIES: No pedal edema, cyanosis, or clubbing.  NEUROLOGIC: Cranial nerves II through XII are intact. Muscle strength 5/5 in all extremities. Sensation intact. Gait not checked.  PSYCHIATRIC: alert and oriented x 3.  SKIN: No obvious rash, lesion, or ulcer.   DATA REVIEW:   CBC   Recent Labs Lab 10/09/14 0542  WBC 5.5  HGB 9.6*  HCT 28.1*  PLT 96*    Chemistries   Recent Labs Lab 10/07/14 0525  10/09/14 0542  NA 140  < > 139  K 3.6  < > 3.8  CL 113*  < > 111  CO2 23  < > 25  GLUCOSE 107*  < > 95  BUN 10  < > 5*  CREATININE 0.73  < > 0.54  CALCIUM 7.6*  < > 8.1*  AST 17  --   --   ALT 7*  --   --   ALKPHOS 43  --   --   BILITOT 1.1  --   --   < > = values in this interval not displayed.  Microbiology Results   Recent Results (from the past 240 hour(s))  Blood Culture (routine x 2)     Status: None   Collection Time: 10/06/14  8:46 AM  Result Value Ref Range Status   Specimen Description BLOOD RIGHT IV  Final   Special Requests   Final    BOTTLES DRAWN AEROBIC AND ANAEROBIC  AER 2CC ANA [place]   Culture NO GROWTH 5 DAYS  Final   Report Status 10/11/2014 FINAL  Final  Blood Culture (routine x 2)     Status: None   Collection Time: 10/06/14  8:57 AM  Result Value Ref Range Status   Specimen Description BLOOD LEFT IV  Final   Special Requests    Final    BOTTLES DRAWN AEROBIC AND ANAEROBIC  AER 5CC ANA 2CC   Culture NO GROWTH 5 DAYS  Final   Report Status 10/11/2014 FINAL  Final  Urine culture  Status: None   Collection Time: 10/06/14  9:22 AM  Result Value Ref Range Status   Specimen Description URINE, RANDOM  Final   Special Requests NONE  Final   Culture NO GROWTH 1 DAY  Final   Report Status 10/07/2014 FINAL  Final  C difficile quick scan w PCR reflex     Status: Abnormal   Collection Time: 10/06/14  4:59 PM  Result Value Ref Range Status   C Diff antigen POSITIVE (A) NEGATIVE Final   C Diff toxin NEGATIVE NEGATIVE Final   C Diff interpretation   Corrected    Positive for toxigenic C. difficile, active toxin production not detected. Patient has toxigenic C. difficile organisms present in the bowel, but toxin was not detected. The patient may be a carrier or the level of toxin in the sample was below the limit  of detection. This information should be used in conjunction with the patient's clinical history when deciding on possible therapy.     Comment: CORRECTED ON 09/05 AT 1554: PREVIOUSLY REPORTED AS REFLEX TO PCR  Clostridium Difficile by PCR     Status: Abnormal   Collection Time: 10/06/14  4:59 PM  Result Value Ref Range Status   Toxigenic C Difficile by pcr POSITIVE (A) NEGATIVE Final    Comment: CRITICAL RESULT CALLED TO, READ BACK BY AND VERIFIED WITH: ALLISON PHILLIPS @ 2123 ON 10/06/2014 CAF   Culture, blood (routine x 2) Call MD if unable to obtain prior to antibiotics being given     Status: None (Preliminary result)   Collection Time: 10/10/14  6:48 PM  Result Value Ref Range Status   Specimen Description BLOOD LEFT HAND  Final   Special Requests BOTTLES DRAWN AEROBIC AND ANAEROBIC 5ML  Final   Culture NO GROWTH < 24 HOURS  Final   Report Status PENDING  Incomplete  Culture, blood (routine x 2) Call MD if unable to obtain prior to antibiotics being given     Status: None (Preliminary result)   Collection  Time: 10/10/14  6:49 PM  Result Value Ref Range Status   Specimen Description BLOOD RIGHT HAND  Final   Special Requests BOTTLES DRAWN AEROBIC AND ANAEROBIC 5ML  Final   Culture NO GROWTH < 24 HOURS  Final   Report Status PENDING  Incomplete  Culture, expectorated sputum-assessment     Status: None   Collection Time: 10/10/14 10:05 PM  Result Value Ref Range Status   Specimen Description SPU  Final   Special Requests NONE  Final   Sputum evaluation   Final    Sputum specimen not acceptable for testing.  Please recollect.   VE RN NOTIFIED OF NEDD TO REORDER AND RECOLLECT 10/10/2014 2245 [facility]   Report Status 10/10/2014 FINAL  Final    RADIOLOGY:  Dg Chest 1 View  10/12/2014   CLINICAL DATA:  CHF.  EXAM: CHEST  1 VIEW  COMPARISON:  CT 10/09/2014.  Chest x-ray 10/09/2011 and 10/06/2014 .  FINDINGS: Mediastinum is stable. Prior prior median sternotomy . Stable cardiomegaly. Improving pulmonary venous congestion and bilateral pulmonary infiltrates and left pleural effusion consistent with improving congestive heart failure. Persistent bibasilar infiltrates/edema and small left pleural effusion remain. No pneumothorax.  IMPRESSION: Prior median sternotomy. Persistent cardiomegaly. Improving pulmonary venous congestion and bilateral pulmonary infiltrates/edema and left pleural effusion consistent with improving congestive heart failure.   Electronically Signed   By: Marcello Moores  Register   On: 10/12/2014 07:30     Management plans discussed with the patient,  family and they are in agreement.  CODE STATUS:     Code Status Orders        Start     Ordered   10/06/14 1432  Full code   Continuous     10/06/14 1431    Advance Directive Documentation        Most Recent Value   Type of Advance Directive  Healthcare Power of Attorney   Pre-existing out of facility DNR order (yellow form or pink MOST form)     "MOST" Form in Place?        TOTAL TIME TAKING CARE OF THIS PATIENT: 40 minutes.     Joslynne Klatt M.D on 10/12/2014 at 9:21 AM  Between 7am to 6pm - Pager - [phone] After 6pm go to [email or web address] - password [email or web address] [facility]  [facility]  Office  [phone]  CC: Primary care physician; Margarita Rana, MD

## 2014-10-13 ENCOUNTER — Telehealth: Payer: Self-pay | Admitting: Family Medicine

## 2014-10-13 NOTE — Care Management Note (Signed)
Case Management Note  Patient Details  Name: MEMPHIS CRESWELL MRN: [id number] Date of Birth: 29-Apr-1965  Subjective/Objective:      Dr Dustin Flock wrote a prescription for Vancomycin for Ms Jablon to get her through the weekend until her prescription for Dificid can be ordered by her pharmacy, [facility] in [place], on Monday. Mr Muller picked up the prescription for Vancomycin at [facility] today. Ms Marton is allergic to Flagyl.            Action/Plan:   Expected Discharge Date:  10/09/14               Expected Discharge Plan:     In-House Referral:     Discharge planning Services     Post Acute Care Choice:    Choice offered to:     DME Arranged:    DME Agency:     HH Arranged:    HH Agency:     Status of Service:     Medicare Important Message Given:    Date Medicare IM Given:    Medicare IM give by:    Date Additional Medicare IM Given:    Additional Medicare Important Message give by:     If discussed at [place] of Stay Meetings, dates discussed:    Additional Comments:  Balian Schaller A, RN 10/13/2014, 3:30 PM

## 2014-10-13 NOTE — Telephone Encounter (Signed)
Pt states she was discharged from [facility] yesterday for cdiff and pneumonia and was given a Rx for Dificid 200mg .  Pt states [facility] and other local pharmacy do not have this Rx.  Pt is asking if she can get another Rx called into [facility].  [phone]

## 2014-10-15 LAB — CULTURE, BLOOD (ROUTINE X 2)
CULTURE: NO GROWTH
Culture: NO GROWTH

## 2014-10-15 NOTE — Telephone Encounter (Signed)
Spoke with Dr. Rosanna Randy about this pt.  He advised that due to the extensive allergy list he was unable to prescribe a different medication.  Pt advised, she is going to have the pharmacy order the medication.  Thanks,   -Mickel Baas

## 2014-10-16 ENCOUNTER — Telehealth: Payer: Self-pay | Admitting: Family Medicine

## 2014-10-16 NOTE — Telephone Encounter (Signed)
Do not see that patient has follow up scheduled. Thanks.

## 2014-10-16 NOTE — Telephone Encounter (Signed)
Stewart has a follow up appointment 10/26/2014 at 10:30 with you.  She also has a follow up with her cardiologist tomorrow to talk about the O2.    Thanks,   -Mickel Baas

## 2014-10-16 NOTE — Telephone Encounter (Signed)
Wyatt Haste from [facility] called to inform you that Kateena refused home health.  Per Milinda Antis was recently released from [facility] with CHF and was on O2.  Loraina says she no longer needs O2.   Thanks,   -Mickel Baas

## 2014-10-17 ENCOUNTER — Other Ambulatory Visit: Payer: Self-pay

## 2014-10-17 DIAGNOSIS — A0472 Enterocolitis due to Clostridium difficile, not specified as recurrent: Secondary | ICD-10-CM

## 2014-10-17 MED ORDER — FIDAXOMICIN 200 MG PO TABS: 200.0000 mg | ORAL_TABLET | Freq: Two times a day (BID) | ORAL | Status: DC

## 2014-10-17 NOTE — Telephone Encounter (Signed)
Pt was prescribed Deficid when she was discharged for Colitis.  The doctor advised her to remain on for 10 days after her next Dentist appointment (10/29/2014).  She does not have enough to get her through until then.  Thanks,   -Mickel Baas

## 2014-10-23 ENCOUNTER — Telehealth: Payer: Self-pay | Admitting: Family Medicine

## 2014-10-23 DIAGNOSIS — A0472 Enterocolitis due to Clostridium difficile, not specified as recurrent: Secondary | ICD-10-CM

## 2014-10-23 MED ORDER — FIDAXOMICIN 200 MG PO TABS: 200.0000 mg | ORAL_TABLET | Freq: Two times a day (BID) | ORAL | Status: DC

## 2014-10-23 NOTE — Telephone Encounter (Signed)
Pt contacted office for refill request on the following medications: fidaxomicin (DIFICID) 200 MG TABS tablet to [facility]. Pt stated she had dental work done yesterday and she needs a refill on this medication to avoid going back to the hospital. Pt requested a nurse return her call. Thanks TNP

## 2014-10-23 NOTE — Telephone Encounter (Signed)
Is this what GI told her to do. I am unfamiliar with this protocol.  Did she take antibiotic. Thanks.

## 2014-10-23 NOTE — Telephone Encounter (Signed)
The hospitalist suggest she should stay on the antibiotic for 10 days after her dentist appointment.   Thanks,   -Mickel Baas

## 2014-10-24 ENCOUNTER — Telehealth: Payer: Self-pay | Admitting: Family Medicine

## 2014-10-24 NOTE — Telephone Encounter (Signed)
Ok to just follow up with Dr. Vira Agar. Thanks.

## 2014-10-24 NOTE — Telephone Encounter (Signed)
Pt stated that she has an appt with Dr. Venia Minks on Friday about her stomach and she has an appt on Monday with Dr. Tiffany Kocher for the same thing. Pt wanted to know if Dr. Venia Minks thinks she needs to keep her Friday appt or can she cancel it and see Dr. Tiffany Kocher on Monday. Please advise. Thanks TNP

## 2014-10-24 NOTE — Telephone Encounter (Signed)
Pt advised; I cancelled her appointment per pt's request.   Thanks,   Mickel Baas

## 2014-10-26 ENCOUNTER — Inpatient Hospital Stay: Payer: Medicaid Other | Admitting: Family Medicine

## 2014-12-07 LAB — LEGIONELLA ANTIGEN, URINE

## 2014-12-25 LAB — EXPECTORATED SPUTUM ASSESSMENT W GRAM STAIN, RFLX TO RESP C

## 2014-12-25 LAB — EXPECTORATED SPUTUM ASSESSMENT W REFEX TO RESP CULTURE

## 2015-02-13 ENCOUNTER — Other Ambulatory Visit: Payer: Self-pay | Admitting: Gastroenterology

## 2015-02-23 ENCOUNTER — Other Ambulatory Visit: Payer: Self-pay | Admitting: Family Medicine

## 2015-02-23 DIAGNOSIS — K219 Gastro-esophageal reflux disease without esophagitis: Secondary | ICD-10-CM

## 2015-03-25 ENCOUNTER — Telehealth: Payer: Self-pay

## 2015-03-25 MED ORDER — OSELTAMIVIR PHOSPHATE 75 MG PO CAPS: 75.0000 mg | ORAL_CAPSULE | Freq: Two times a day (BID) | ORAL | Status: DC

## 2015-03-25 NOTE — Telephone Encounter (Signed)
Pt advised as directed below.   Thanks,   -Laura  

## 2015-03-25 NOTE — Telephone Encounter (Signed)
Pt called c/o flu-like sx since last night including body-aches (leg pain), chills, abdominal pain, fever of 102.5. Pt states she is NOT allergic to Tamiflu and is [facility] [place], [place]

## 2015-03-25 NOTE — Telephone Encounter (Signed)
Sent in rx. Needs ov if not improved. Thanks.

## 2015-03-26 ENCOUNTER — Inpatient Hospital Stay
Admission: EM | Admit: 2015-03-26 | Discharge: 2015-03-31 | DRG: 392 | Disposition: A | Discharge status: Home / Self Care | Payer: Medicaid Other | Attending: Internal Medicine | Admitting: Internal Medicine

## 2015-03-26 ENCOUNTER — Emergency Department: Payer: Medicaid Other

## 2015-03-26 DIAGNOSIS — Z90710 Acquired absence of both cervix and uterus: Secondary | ICD-10-CM

## 2015-03-26 DIAGNOSIS — Z8249 Family history of ischemic heart disease and other diseases of the circulatory system: Secondary | ICD-10-CM

## 2015-03-26 DIAGNOSIS — Z79899 Other long term (current) drug therapy: Secondary | ICD-10-CM

## 2015-03-26 DIAGNOSIS — Z8774 Personal history of (corrected) congenital malformations of heart and circulatory system: Secondary | ICD-10-CM

## 2015-03-26 DIAGNOSIS — A084 Viral intestinal infection, unspecified: Principal | ICD-10-CM | POA: Diagnosis present

## 2015-03-26 DIAGNOSIS — R188 Other ascites: Secondary | ICD-10-CM | POA: Diagnosis present

## 2015-03-26 DIAGNOSIS — Z886 Allergy status to analgesic agent status: Secondary | ICD-10-CM

## 2015-03-26 DIAGNOSIS — Z9889 Other specified postprocedural states: Secondary | ICD-10-CM

## 2015-03-26 DIAGNOSIS — J90 Pleural effusion, not elsewhere classified: Secondary | ICD-10-CM | POA: Diagnosis present

## 2015-03-26 DIAGNOSIS — J302 Other seasonal allergic rhinitis: Secondary | ICD-10-CM | POA: Diagnosis present

## 2015-03-26 DIAGNOSIS — R0602 Shortness of breath: Secondary | ICD-10-CM

## 2015-03-26 DIAGNOSIS — B3781 Candidal esophagitis: Secondary | ICD-10-CM | POA: Diagnosis present

## 2015-03-26 DIAGNOSIS — J329 Chronic sinusitis, unspecified: Secondary | ICD-10-CM | POA: Diagnosis present

## 2015-03-26 DIAGNOSIS — R1110 Vomiting, unspecified: Secondary | ICD-10-CM

## 2015-03-26 DIAGNOSIS — J9811 Atelectasis: Secondary | ICD-10-CM | POA: Diagnosis present

## 2015-03-26 DIAGNOSIS — Z853 Personal history of malignant neoplasm of breast: Secondary | ICD-10-CM

## 2015-03-26 DIAGNOSIS — Z7951 Long term (current) use of inhaled steroids: Secondary | ICD-10-CM

## 2015-03-26 DIAGNOSIS — Z885 Allergy status to narcotic agent status: Secondary | ICD-10-CM

## 2015-03-26 DIAGNOSIS — K589 Irritable bowel syndrome without diarrhea: Secondary | ICD-10-CM | POA: Diagnosis present

## 2015-03-26 DIAGNOSIS — Z888 Allergy status to other drugs, medicaments and biological substances status: Secondary | ICD-10-CM

## 2015-03-26 DIAGNOSIS — E860 Dehydration: Secondary | ICD-10-CM | POA: Diagnosis present

## 2015-03-26 DIAGNOSIS — R197 Diarrhea, unspecified: Secondary | ICD-10-CM

## 2015-03-26 DIAGNOSIS — R109 Unspecified abdominal pain: Secondary | ICD-10-CM

## 2015-03-26 DIAGNOSIS — R112 Nausea with vomiting, unspecified: Secondary | ICD-10-CM | POA: Diagnosis present

## 2015-03-26 DIAGNOSIS — F329 Major depressive disorder, single episode, unspecified: Secondary | ICD-10-CM | POA: Diagnosis present

## 2015-03-26 DIAGNOSIS — Z85038 Personal history of other malignant neoplasm of large intestine: Secondary | ICD-10-CM

## 2015-03-26 LAB — CBC
HCT: 39.7 % (ref 35.0–47.0)
Hemoglobin: 13.3 g/dL (ref 12.0–16.0)
MCH: 26.2 pg (ref 26.0–34.0)
MCHC: 33.4 g/dL (ref 32.0–36.0)
MCV: 78.7 fL — AB (ref 80.0–100.0)
PLATELETS: 128 10*3/uL — AB (ref 150–440)
RBC: 5.05 MIL/uL (ref 3.80–5.20)
RDW: 15.7 % — ABNORMAL HIGH (ref 11.5–14.5)
WBC: 6.7 10*3/uL (ref 3.6–11.0)

## 2015-03-26 LAB — COMPREHENSIVE METABOLIC PANEL
ALT: 15 U/L (ref 14–54)
AST: 23 U/L (ref 15–41)
Albumin: 4.3 g/dL (ref 3.5–5.0)
Alkaline Phosphatase: 66 U/L (ref 38–126)
Anion gap: 8 (ref 5–15)
BUN: 11 mg/dL (ref 6–20)
CHLORIDE: 107 mmol/L (ref 101–111)
CO2: 25 mmol/L (ref 22–32)
Calcium: 9.4 mg/dL (ref 8.9–10.3)
Creatinine, Ser: 0.75 mg/dL (ref 0.44–1.00)
Glucose, Bld: 124 mg/dL — ABNORMAL HIGH (ref 65–99)
POTASSIUM: 4 mmol/L (ref 3.5–5.1)
Sodium: 140 mmol/L (ref 135–145)
Total Bilirubin: 1.1 mg/dL (ref 0.3–1.2)
Total Protein: 7.9 g/dL (ref 6.5–8.1)

## 2015-03-26 LAB — URINALYSIS COMPLETE WITH MICROSCOPIC (ARMC ONLY)
BILIRUBIN URINE: NEGATIVE
Bacteria, UA: NONE SEEN
GLUCOSE, UA: NEGATIVE mg/dL
LEUKOCYTES UA: NEGATIVE
NITRITE: NEGATIVE
Protein, ur: 30 mg/dL — AB
Specific Gravity, Urine: 1.029 (ref 1.005–1.030)
pH: 5 (ref 5.0–8.0)

## 2015-03-26 LAB — RAPID INFLUENZA A&B ANTIGENS (ARMC ONLY)
INFLUENZA A (ARMC): NOT DETECTED
INFLUENZA B (ARMC): NOT DETECTED

## 2015-03-26 LAB — LIPASE, BLOOD: LIPASE: 21 U/L (ref 11–51)

## 2015-03-26 MED ORDER — ONDANSETRON HCL 4 MG/2ML IJ SOLN
INTRAMUSCULAR | Status: AC
  Administered 2015-03-26: 4 mg via INTRAVENOUS
  Filled 2015-03-26: qty 2

## 2015-03-26 MED ORDER — ONDANSETRON HCL 4 MG/2ML IJ SOLN
4.0000 mg | Freq: Once | INTRAMUSCULAR | Status: AC
  Administered 2015-03-26: 4 mg via INTRAVENOUS

## 2015-03-26 MED ORDER — SODIUM CHLORIDE 0.9 % IV BOLUS (SEPSIS)
1000.0000 mL | Freq: Once | INTRAVENOUS | Status: AC
  Administered 2015-03-26: 1000 mL via INTRAVENOUS

## 2015-03-26 MED ORDER — ACETAMINOPHEN 500 MG PO TABS
1000.0000 mg | ORAL_TABLET | Freq: Once | ORAL | Status: AC
  Administered 2015-03-26: 1000 mg via ORAL
  Filled 2015-03-26: qty 2

## 2015-03-26 NOTE — ED Notes (Signed)
Patient transported to X-ray 
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
## 2015-03-26 NOTE — ED Notes (Signed)
Pt informed that urine needed for test, states not able now but will try again.

## 2015-03-26 NOTE — ED Notes (Addendum)
Pt arrived to ED with c/o vomiting all day. Pt reports fever and chills yesterday. Pt states "I called my doctor yesterday and told them about the fever and chills and they told me I had the flu. They called in a prescription for Tamiflu. " Pt reports started takingTamiflu yesterday. Pt c/o vomiting, abdominal pain, chills, and pain in chest.

## 2015-03-26 NOTE — ED Notes (Signed)
Pt actively vomiting in triage 

## 2015-03-27 ENCOUNTER — Encounter: Payer: Self-pay | Admitting: Internal Medicine

## 2015-03-27 DIAGNOSIS — R112 Nausea with vomiting, unspecified: Secondary | ICD-10-CM | POA: Diagnosis present

## 2015-03-27 LAB — BASIC METABOLIC PANEL
ANION GAP: 4 — AB (ref 5–15)
BUN: 14 mg/dL (ref 6–20)
CO2: 24 mmol/L (ref 22–32)
Calcium: 8.1 mg/dL — ABNORMAL LOW (ref 8.9–10.3)
Chloride: 111 mmol/L (ref 101–111)
Creatinine, Ser: 0.78 mg/dL (ref 0.44–1.00)
GFR calc Af Amer: >60 mL/min (ref >60)
Glucose, Bld: 114 mg/dL — ABNORMAL HIGH (ref 65–99)
POTASSIUM: 3.9 mmol/L (ref 3.5–5.1)
SODIUM: 139 mmol/L (ref 135–145)

## 2015-03-27 LAB — CBC
HCT: 33.1 % — ABNORMAL LOW (ref 35.0–47.0)
Hemoglobin: 11.1 g/dL — ABNORMAL LOW (ref 12.0–16.0)
MCH: 26 pg (ref 26.0–34.0)
MCHC: 33.5 g/dL (ref 32.0–36.0)
MCV: 77.5 fL — ABNORMAL LOW (ref 80.0–100.0)
PLATELETS: 112 10*3/uL — AB (ref 150–440)
RBC: 4.27 MIL/uL (ref 3.80–5.20)
RDW: 15.6 % — ABNORMAL HIGH (ref 11.5–14.5)
WBC: 5.9 10*3/uL (ref 3.6–11.0)

## 2015-03-27 MED ORDER — MAGNESIUM SULFATE 2 GM/50ML IV SOLN
2.0000 g | Freq: Once | INTRAVENOUS | Status: AC
Start: 2015-03-27 — End: 2015-03-27
  Administered 2015-03-27: 2 g via INTRAVENOUS
  Filled 2015-03-27: qty 50

## 2015-03-27 MED ORDER — PROMETHAZINE HCL 25 MG/ML IJ SOLN
12.5000 mg | Freq: Four times a day (QID) | INTRAMUSCULAR | Status: DC | PRN
  Administered 2015-03-28 – 2015-03-30 (×2): 12.5 mg via INTRAVENOUS
  Filled 2015-03-27 (×2): qty 1

## 2015-03-27 MED ORDER — GABAPENTIN 300 MG PO CAPS
300.0000 mg | ORAL_CAPSULE | Freq: Every day | ORAL | Status: DC
  Administered 2015-03-28 – 2015-03-31 (×4): 300 mg via ORAL
  Filled 2015-03-27 (×5): qty 1

## 2015-03-27 MED ORDER — ENOXAPARIN SODIUM 40 MG/0.4ML ~~LOC~~ SOLN
40.0000 mg | SUBCUTANEOUS | Status: DC
  Administered 2015-03-28: 40 mg via SUBCUTANEOUS
  Filled 2015-03-27 (×3): qty 0.4

## 2015-03-27 MED ORDER — IPRATROPIUM-ALBUTEROL 0.5-2.5 (3) MG/3ML IN SOLN
3.0000 mL | RESPIRATORY_TRACT | Status: DC | PRN
  Administered 2015-03-30: 3 mL via RESPIRATORY_TRACT
  Filled 2015-03-27: qty 3

## 2015-03-27 MED ORDER — FLUTICASONE PROPIONATE 50 MCG/ACT NA SUSP: 2.0000 | Freq: Every day | NASAL | Status: DC | PRN

## 2015-03-27 MED ORDER — CITALOPRAM HYDROBROMIDE 20 MG PO TABS
10.0000 mg | ORAL_TABLET | Freq: Every day | ORAL | Status: DC
  Administered 2015-03-28 – 2015-03-31 (×4): 10 mg via ORAL
  Filled 2015-03-27 (×5): qty 1

## 2015-03-27 MED ORDER — SODIUM CHLORIDE 0.9 % IV SOLN
8.0000 mg | Freq: Once | INTRAVENOUS | Status: AC
  Administered 2015-03-27: 8 mg via INTRAVENOUS
  Filled 2015-03-27: qty 4

## 2015-03-27 MED ORDER — PANTOPRAZOLE SODIUM 40 MG IV SOLR
40.0000 mg | INTRAVENOUS | Status: DC
  Administered 2015-03-27 – 2015-03-30 (×4): 40 mg via INTRAVENOUS
  Filled 2015-03-27 (×5): qty 40

## 2015-03-27 MED ORDER — SODIUM CHLORIDE 0.9 % IV SOLN
INTRAVENOUS | Status: DC
  Administered 2015-03-27 – 2015-03-31 (×7): via INTRAVENOUS

## 2015-03-27 MED ORDER — KETOROLAC TROMETHAMINE 30 MG/ML IJ SOLN
30.0000 mg | Freq: Once | INTRAMUSCULAR | Status: AC
  Administered 2015-03-27: 30 mg via INTRAVENOUS
  Filled 2015-03-27: qty 1

## 2015-03-27 MED ORDER — ALBUTEROL SULFATE (2.5 MG/3ML) 0.083% IN NEBU: 3.0000 mL | INHALATION_SOLUTION | Freq: Four times a day (QID) | RESPIRATORY_TRACT | Status: DC | PRN

## 2015-03-27 MED ORDER — ONDANSETRON HCL 4 MG PO TABS: 4.0000 mg | ORAL_TABLET | Freq: Four times a day (QID) | ORAL | Status: DC | PRN

## 2015-03-27 MED ORDER — IPRATROPIUM-ALBUTEROL 20-100 MCG/ACT IN AERS: 1.0000 | INHALATION_SPRAY | RESPIRATORY_TRACT | Status: DC | PRN

## 2015-03-27 MED ORDER — MORPHINE SULFATE (PF) 2 MG/ML IV SOLN
2.0000 mg | INTRAVENOUS | Status: DC | PRN
  Filled 2015-03-27: qty 1

## 2015-03-27 MED ORDER — ONDANSETRON HCL 4 MG/2ML IJ SOLN
4.0000 mg | Freq: Four times a day (QID) | INTRAMUSCULAR | Status: DC | PRN
  Administered 2015-03-27 – 2015-03-30 (×3): 4 mg via INTRAVENOUS
  Filled 2015-03-27 (×3): qty 2

## 2015-03-27 MED ORDER — PROMETHAZINE HCL 25 MG/ML IJ SOLN
12.5000 mg | Freq: Once | INTRAMUSCULAR | Status: AC
  Administered 2015-03-27: 12.5 mg via INTRAVENOUS
  Filled 2015-03-27: qty 1

## 2015-03-27 NOTE — ED Notes (Signed)
Admitting at bedside 

## 2015-03-27 NOTE — Progress Notes (Signed)
Patient ID: Erin Richards, female   DOB: 08-Mar-1965, 50 y.o.   MRN: [id number] [facility] PROGRESS NOTE  Erin Richards [id number] DOB: 05/20/1965 DOA: 03/26/2015 PCP: Margarita Rana, MD  HPI/Subjective: Patient with nausea and vomiting. To many to count. No hematemesis. Positive for abdominal pain 10 out of 10. But allergic to pain medications. Patient having diarrhea. No blood in the bowel movements. Patient also having a headache.  Objective: Filed Vitals:   03/27/15 0533 03/27/15 0752  BP: 92/50 103/45  Pulse: 70 81  Temp: 99.1 F (37.3 C) 99.2 F (37.3 C)  Resp: 18 20    Filed Weights   03/26/15 2004 03/27/15 0134  Weight: 63.504 kg (140 lb) 62.46 kg (137 lb 11.2 oz)    ROS: Review of Systems  Constitutional: Negative for fever and chills.  Eyes: Negative for blurred vision.  Respiratory: Negative for cough and shortness of breath.   Cardiovascular: Negative for chest pain.  Gastrointestinal: Positive for nausea, vomiting, abdominal pain and diarrhea. Negative for constipation.  Genitourinary: Negative for dysuria.  Musculoskeletal: Negative for joint pain.  Neurological: Positive for headaches. Negative for dizziness.   Exam: Physical Exam  Constitutional: She is oriented to person, place, and time.  HENT:  Nose: No mucosal edema.  Mouth/Throat: No oropharyngeal exudate or posterior oropharyngeal edema.  Eyes: Conjunctivae, EOM and lids are normal. Pupils are equal, round, and reactive to light.  Neck: No JVD present. Carotid bruit is not present. No edema present. No thyroid mass and no thyromegaly present.  Cardiovascular: S1 normal and S2 normal.  Exam reveals no gallop.   Murmur heard.  Systolic murmur is present with a grade of 2/6  Pulses:      Dorsalis pedis pulses are 2+ on the right side, and 2+ on the left side.  Respiratory: No respiratory distress. She has no wheezes. She has no rhonchi. She has no rales.  GI: Soft. Bowel sounds are  normal. There is generalized tenderness.  Musculoskeletal:       Right ankle: She exhibits no swelling.       Left ankle: She exhibits no swelling.  Lymphadenopathy:    She has no cervical adenopathy.  Neurological: She is alert and oriented to person, place, and time. No cranial nerve deficit.  Skin: Skin is warm. No rash noted. Nails show no clubbing.  Psychiatric: She has a normal mood and affect.      Data Reviewed: Basic Metabolic Panel:  Recent Labs Lab 03/26/15 2014 03/27/15 0222  NA 140 139  K 4.0 3.9  CL 107 111  CO2 25 24  GLUCOSE 124* 114*  BUN 11 14  CREATININE 0.75 0.78  CALCIUM 9.4 8.1*   Liver Function Tests:  Recent Labs Lab 03/26/15 2014  AST 23  ALT 15  ALKPHOS 66  BILITOT 1.1  PROT 7.9  ALBUMIN 4.3    Recent Labs Lab 03/26/15 2014  LIPASE 21   CBC:  Recent Labs Lab 03/26/15 2014 03/27/15 0222  WBC 6.7 5.9  HGB 13.3 11.1*  HCT 39.7 33.1*  MCV 78.7* 77.5*  PLT 128* 112*     Recent Results (from the past 240 hour(s))  Rapid Influenza A&B Antigens ([facility] only)     Status: None   Collection Time: 03/26/15  8:56 PM  Result Value Ref Range Status   Influenza A [facility]) NOT DETECTED  Final   Influenza B [facility]) NOT DETECTED  Final     Studies: Dg Chest 2 View  03/26/2015  CLINICAL DATA:  Vomiting.  Fever and chills. EXAM: CHEST  2 VIEW COMPARISON:  10/12/2014 FINDINGS: Pediatric size sternotomy wires observed. Heart size within normal limits. While I do not show shunt vascularity currently, there appear to be perihilar lobularity/nodularity bilaterally which is chronic and thought to potentially relate to small pulmonary artery aneurysms given the appearance for example on 09/21/2013. By report the patient has a history of ASD. The lungs appear otherwise clear.  No pleural effusion. IMPRESSION: 1. Central lobularity/nodularity along the pulmonary arteries, more notable on the right, and believed to be chronic. 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 Pulmonary arterial aneurysms can occur from a variety of conditions, including left right shunts. Pediatric size sternotomy wires are observed. 2. No acute pulmonary findings. Electronically Signed   By: Van Clines M.D.   On: 03/26/2015 21:05    Scheduled Meds: . citalopram  10 mg Oral Daily  . enoxaparin (LOVENOX) injection  40 mg Subcutaneous Q24H  . gabapentin  300 mg Oral Daily  . pantoprazole (PROTONIX) IV  40 mg Intravenous Q24H   Continuous Infusions: . sodium chloride 125 mL/hr at 03/27/15 1100    Assessment/Plan:  1. Generalized abdominal pain with nausea vomiting diarrhea. Supportive care and likely a gastroenteritis. Send off stool for C. difficile since she has a history of this in the past. Send off stool culture. IV fluid hydration. Clear liquid diet. Protonix IV. When necessary Phenergan. 2. Headache- will give magnesium IV and Toradol 3. History of IBS 4. Depression on Celexa 5. ASD history  Code Status:     Code Status Orders        Start     Ordered   03/27/15 0129  Full code   Continuous     03/27/15 0128    Code Status History    Date Active Date Inactive Code Status Order ID Comments User Context   10/06/2014  2:31 PM 10/12/2014  5:19 PM Full Code [id number]  Idelle Crouch, MD Inpatient   06/03/2014  5:09 PM 06/04/2014  5:20 PM Full Code [id number]  Fritzi Mandes, MD ED     Family Communication: Spoke with husband on phone Disposition Plan: Once she is able to tolerates diet and stops vomiting sheet potentially go home  Time spent: 35 minutes  [place] [place] [facility]

## 2015-03-27 NOTE — Progress Notes (Signed)
Called Dr Leslye Peer about pt vomiting not relieved with zofran  Orders received

## 2015-03-27 NOTE — Progress Notes (Signed)
Pt ambulated to bathroom voided 100 cc c/o not empting bladder , pt assisted back to bed bladder scanned 36 cc remaining in bladder , pt gaging no bringing up anything

## 2015-03-27 NOTE — Progress Notes (Signed)
Pt. C/o headache. Unable to take in any PO meds d/t vomiting. Dr. Estanislado Pandy ordered morphine 2mg  q4h prn.

## 2015-03-27 NOTE — H&P (Signed)
[facility] NAME: Erin Richards    MR#:  [id number]  DATE OF BIRTH:  06/09/1965  DATE OF ADMISSION:  03/26/2015  PRIMARY CARE PHYSICIAN: Margarita Rana, MD   REQUESTING/REFERRING PHYSICIAN:   CHIEF COMPLAINT:   Chief Complaint  Patient presents with  . Emesis    HISTORY OF PRESENT ILLNESS: Erin Richards  is a 50 y.o. female with a known history of irritable bowel syndrome ,seasonal allergies presented to the emergency room for nausea and vomiting since yesterday morning. Vomitus contains water and food particles. No complaints of any chest pain. Has some generalized weakness and unable to eat any food secondary to vomiting. Feels rundown and tired. Has no abdominal pain and no diarrhea. No recent travel, or any sick contacts at home. Flu test has been negative in the emergency room. Patient was given antiemetic medication and was started on IV fluids. Not able to tolerate oral diet. Hospitalist service was consulted for further care. No history of peptic ulcer disease. Patient also felt dizzy since yesterday morning. No history of fall or any head injury.  PAST MEDICAL HISTORY:   Past Medical History  Diagnosis Date  . IBS (irritable bowel syndrome)   . 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 Allergy   . Depression     PAST SURGICAL HISTORY: Past Surgical History  Procedure Laterality Date  . Cardiac surgery    . Abdominal hysterectomy    . Colonoscopy with propofol N/A 08/16/2014    Procedure: COLONOSCOPY WITH PROPOFOL;  Surgeon: Manya Silvas, MD;  Location: [facility] ENDOSCOPY;  Service: Endoscopy;  Laterality: N/A;  . Esophagogastroduodenoscopy (egd) with propofol  08/16/2014    Procedure: ESOPHAGOGASTRODUODENOSCOPY (EGD) WITH PROPOFOL;  Surgeon: Manya Silvas, MD;  Location: [facility] ENDOSCOPY;  Service: Endoscopy;;    SOCIAL HISTORY:  Social History  Substance Use Topics  . Smoking status: Never Smoker    . Smokeless tobacco: Never Used  . Alcohol Use: No    FAMILY HISTORY:  Family History  Problem Relation Age of Onset  . Heart disease Father   . Cancer Father     PANCREAS  . Cancer Sister     BREAST    DRUG ALLERGIES:  Allergies  Allergen Reactions  . Acetaminophen-Codeine Nausea And Vomiting  . Aspartame Other (See Comments)    Reaction: unknown  . Aspartame And Phenylalanine Nausea And Vomiting  . Antiseptic Oral Rinse  [Cetylpyridinium Chloride] Other (See Comments)    UNK  . Biaxin [Clarithromycin] Nausea And Vomiting  . Chlorhexidine Other (See Comments)  . Chlorhexidine Gluconate Nausea And Vomiting  . Clindamycin Diarrhea and Nausea And Vomiting  . Clindamycin/Lincomycin Nausea And Vomiting  . Codeine Itching and Nausea And Vomiting  . Dextromethorphan Hbr Other (See Comments)    Reaction: unknown  . Dilaudid [Hydromorphone Hcl] Nausea And Vomiting  . Doxycycline Nausea And Vomiting  . Fentanyl Nausea And Vomiting  . Germanium Other (See Comments)  . Hydrocodone Nausea And Vomiting  . Ketorolac Other (See Comments)  . Levofloxacin Other (See Comments)    GI upset. GI upset  . Mefenamic Acid Nausea And Vomiting  . Metformin And Related Nausea And Vomiting  . Metronidazole Diarrhea and Nausea And Vomiting  . Morphine And Related Nausea And Vomiting  . Morphine Sulfate Itching and Nausea And Vomiting  . Moxifloxacin Swelling  . Moxifloxacin Hcl In Nacl Other (See Comments)    Reaction: unknown  .  Nitrofuran Derivatives Other (See Comments)    Reaction: unknown  . Nitrofurantoin Nausea And Vomiting and Other (See Comments)  . Nitrofurantoin Monohyd Macro Other (See Comments)    Reaction: unknown  . Nsaids Other (See Comments)    Reaction: unknown  . Other Other (See Comments)  . Oxycodone-Acetaminophen Nausea And Vomiting  . Periguard [Dimethicone] Nausea And Vomiting  . Phenothiazines Nausea And Vomiting  . Pioglitazone Nausea And Vomiting  .  Quinidine Nausea And Vomiting  . Quinine Derivatives Nausea And Vomiting  . Quinolones Nausea And Vomiting  . Rumex Crispus Other (See Comments)  . Tetracycline Nausea And Vomiting  . Tetracyclines & Related Nausea And Vomiting  . Toradol [Ketorolac Tromethamine] Nausea And Vomiting  . Tramadol Nausea And Vomiting  . Tussin [Guaifenesin] Nausea And Vomiting  . Tussionex Pennkinetic Er [Hydrocod Polst-Cpm Polst Er] Nausea And Vomiting  . Buprenorphine Hcl Nausea And Vomiting  . Hydrocodone-Chlorpheniramine Nausea And Vomiting  . Hydromorphone Nausea And Vomiting and Other (See Comments)    UNK  . Lincomycin Hcl Nausea And Vomiting  . Phenylalanine Nausea And Vomiting    REVIEW OF SYSTEMS:   CONSTITUTIONAL: No fever, has weakness.  EYES: No blurred or double vision.  EARS, NOSE, AND THROAT: No tinnitus or ear pain.  RESPIRATORY: occasional cough, shortness of breath, wheezing or hemoptysis.  CARDIOVASCULAR: No chest pain, orthopnea, edema.  GASTROINTESTINAL: Has nausea and vomiting, no diarrhea or abdominal pain.  GENITOURINARY: No dysuria, hematuria.  ENDOCRINE: No polyuria, nocturia,  HEMATOLOGY: No anemia, easy bruising or bleeding SKIN: No rash or lesion. MUSCULOSKELETAL: No joint pain or arthritis.   NEUROLOGIC: No tingling, numbness, weakness.  PSYCHIATRY: No anxiety or depression.   MEDICATIONS AT HOME:  Prior to Admission medications   Medication Sig Start Date End Date Taking? Authorizing Provider  albuterol (PROVENTIL HFA;VENTOLIN HFA) 108 (90 BASE) MCG/ACT inhaler Inhale 1-2 puffs into the lungs every 6 (six) hours as needed for shortness of breath. 08/29/14  Yes Margarita Rana, MD  cetirizine (ZYRTEC) 10 MG tablet Take 10 mg by mouth daily.   Yes Historical Provider, MD  citalopram (CELEXA) 20 MG tablet Take 10 mg by mouth daily.   Yes Historical Provider, MD  dicyclomine (BENTYL) 20 MG tablet Take 20 mg by mouth every 4 (four) hours as needed for spasms.   Yes  Historical Provider, MD  estradiol (ESTRACE) 0.5 MG tablet Take 0.5 mg by mouth daily.   Yes Historical Provider, MD  fluticasone (FLONASE) 50 MCG/ACT nasal spray Place 2 sprays into both nostrils daily as needed for allergies or rhinitis.   Yes Historical Provider, MD  gabapentin (NEURONTIN) 300 MG capsule Take 300 mg by mouth daily.    Yes Historical Provider, MD  hyoscyamine (LEVSIN, ANASPAZ) 0.125 MG tablet Take 0.125 mg by mouth daily as needed for bladder spasms or cramping.  08/08/14 08/08/15 Yes Historical Provider, MD  Ipratropium-Albuterol (COMBIVENT RESPIMAT) 20-100 MCG/ACT AERS respimat Inhale 1 puff into the lungs every 4 (four) hours as needed for wheezing.   Yes Historical Provider, MD  montelukast (SINGULAIR) 10 MG tablet Take 1 tablet by mouth at bedtime.    Yes Historical Provider, MD  nortriptyline (PAMELOR) 50 MG capsule Take 1 capsule by mouth daily as needed (headache).  03/12/14  Yes Historical Provider, MD  oseltamivir (TAMIFLU) 75 MG capsule Take 1 capsule (75 mg total) by mouth 2 (two) times daily. 03/25/15  Yes Margarita Rana, MD  pantoprazole (PROTONIX) 40 MG tablet Take 40 mg by mouth daily.  Yes Historical Provider, MD  fidaxomicin (DIFICID) 200 MG TABS tablet Take 1 tablet (200 mg total) by mouth 2 (two) times daily. Patient not taking: Reported on 03/26/2015 10/23/14   Margarita Rana, MD  furosemide (LASIX) 40 MG tablet Take 1 tablet (40 mg total) by mouth daily. Patient not taking: Reported on 03/26/2015 10/12/14   Fritzi Mandes, MD      PHYSICAL EXAMINATION:   VITAL SIGNS: Blood pressure 100/60, pulse 85, temperature 98.4 F (36.9 C), temperature source Oral, resp. rate 22, height 4\' 9"  (1.448 m), weight 63.504 kg (140 lb), SpO2 96 %.  GENERAL:  50 y.o.-year-old patient lying in the bed with no acute distress.  EYES: Pupils equal, round, reactive to light and accommodation. No scleral icterus. Extraocular muscles intact.  HEENT: Head atraumatic, normocephalic. Oropharynx dry  and nasopharynx clear.  NECK:  Supple, no jugular venous distention. No thyroid enlargement, no tenderness.  LUNGS: Normal breath sounds bilaterally, no wheezing, rales,rhonchi or crepitation. No use of accessory muscles of respiration.  CARDIOVASCULAR: S1, S2 normal. No murmurs, rubs, or gallops.  ABDOMEN: Soft, nontender, nondistended. Bowel sounds present. No organomegaly or mass.  EXTREMITIES: No pedal edema, cyanosis, or clubbing.  NEUROLOGIC: Cranial nerves II through XII are intact. Muscle strength 5/5 in all extremities. Sensation intact. Gait normal. PSYCHIATRIC: The patient is alert and oriented x 3.  SKIN: No obvious rash, lesion, or ulcer.   LABORATORY PANEL:   CBC  Recent Labs Lab 03/26/15 2014  WBC 6.7  HGB 13.3  HCT 39.7  PLT 128*  MCV 78.7*  MCH 26.2  MCHC 33.4  RDW 15.7*   ------------------------------------------------------------------------------------------------------------------  Chemistries   Recent Labs Lab 03/26/15 2014  NA 140  K 4.0  CL 107  CO2 25  GLUCOSE 124*  BUN 11  CREATININE 0.75  CALCIUM 9.4  AST 23  ALT 15  ALKPHOS 66  BILITOT 1.1   ------------------------------------------------------------------------------------------------------------------ estimated creatinine clearance is 66.7 mL/min (by C-G formula based on Cr of 0.75). ------------------------------------------------------------------------------------------------------------------ No results for input(s): TSH, T4TOTAL, T3FREE, THYROIDAB in the last 72 hours.  Invalid input(s): FREET3   Coagulation profile No results for input(s): INR, PROTIME in the last 168 hours. ------------------------------------------------------------------------------------------------------------------- No results for input(s): DDIMER in the last 72 hours. -------------------------------------------------------------------------------------------------------------------  Cardiac  Enzymes No results for input(s): CKMB, TROPONINI, MYOGLOBIN in the last 168 hours.  Invalid input(s): CK ------------------------------------------------------------------------------------------------------------------ Invalid input(s): POCBNP  ---------------------------------------------------------------------------------------------------------------  Urinalysis    Component Value Date/Time   COLORURINE YELLOW* 03/26/2015 2249   COLORURINE Straw 10/02/2013 1133   APPEARANCEUR HAZY* 03/26/2015 2249   APPEARANCEUR Clear 10/02/2013 1133   LABSPEC 1.029 03/26/2015 2249   LABSPEC 1.005 10/02/2013 1133   PHURINE 5.0 03/26/2015 2249   PHURINE 7.0 10/02/2013 1133   GLUCOSEU NEGATIVE 03/26/2015 2249   GLUCOSEU Negative 10/02/2013 1133   HGBUR 1+* 03/26/2015 2249   HGBUR Negative 10/02/2013 1133   BILIRUBINUR NEGATIVE 03/26/2015 2249   BILIRUBINUR Negative 10/02/2013 1133   KETONESUR 1+* 03/26/2015 2249   KETONESUR Negative 10/02/2013 1133   PROTEINUR 30* 03/26/2015 2249   PROTEINUR Negative 10/02/2013 1133   NITRITE NEGATIVE 03/26/2015 2249   NITRITE Negative 10/02/2013 1133   LEUKOCYTESUR NEGATIVE 03/26/2015 2249   LEUKOCYTESUR Negative 10/02/2013 1133     RADIOLOGY: Dg Chest 2 View  03/26/2015  CLINICAL DATA:  Vomiting.  Fever and chills. EXAM: CHEST  2 VIEW COMPARISON:  10/12/2014 FINDINGS: Pediatric size sternotomy wires observed. Heart size within normal limits. While I do not show shunt vascularity currently,  there appear to be perihilar lobularity/nodularity bilaterally which is chronic and thought to potentially relate to small pulmonary artery aneurysms given the appearance for example on 09/21/2013. By report the patient has a history of ASD. The lungs appear otherwise clear.  No pleural effusion. IMPRESSION: 1. Central lobularity/nodularity along the pulmonary arteries, more notable on the right, and believed to be chronic. 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 Pulmonary arterial aneurysms can occur from a variety of conditions, including left right shunts. Pediatric size sternotomy wires are observed. 2. No acute pulmonary findings. Electronically Signed   By: Van Clines M.D.   On: 03/26/2015 21:05    EKG: Orders placed or performed during the hospital encounter of 03/26/15  . ED EKG  . ED EKG    IMPRESSION AND PLAN: 50 year old female patient with history of irritable bowel syndrome, seasonal allergies presented to the emergency room with nausea and vomiting and poor oral intake. Admitting diagnosis 1. Intractable nausea and vomiting 2. Dehydration 3. Dizziness and giddiness 4.Irritable bowel syndrome (history) Treatment plan Admit patient to medical floor observation bed IV fluid hydration Antiemetic medication DVT prophylaxis with subcutaneous Lovenox Monitor electrolytes. All the records are reviewed and case discussed with ED provider. Management plans discussed with the patient, family and they are in agreement.  CODE STATUS:FULL Code Status History    Date Active Date Inactive Code Status Order ID Comments User Context   10/06/2014  2:31 PM 10/12/2014  5:19 PM Full Code [id number]  Idelle Crouch, MD Inpatient   06/03/2014  5:09 PM 06/04/2014  5:20 PM Full Code [id number]  Fritzi Mandes, MD ED       TOTAL TIME TAKING CARE OF THIS PATIENT: 50 minutes.    Saundra Shelling M.D on 03/27/2015 at 12:19 AM  Between 7am to 6pm - Pager - [phone]  After 6pm go to [email or web address] - password [email or web address] [facility]  [place] [facility]  Office  [phone]  CC: Primary care physician; Margarita Rana, MD

## 2015-03-27 NOTE — Progress Notes (Signed)
Pt. Refused morphine. It makes her sick to her stomach.

## 2015-03-27 NOTE — Progress Notes (Signed)
Pt. Refused to wear SCD's.

## 2015-03-28 LAB — BASIC METABOLIC PANEL
Anion gap: 7 (ref 5–15)
BUN: 21 mg/dL — AB (ref 6–20)
CHLORIDE: 113 mmol/L — AB (ref 101–111)
CO2: 22 mmol/L (ref 22–32)
CREATININE: 0.66 mg/dL (ref 0.44–1.00)
Calcium: 8.3 mg/dL — ABNORMAL LOW (ref 8.9–10.3)
GFR calc Af Amer: >60 mL/min (ref >60)
GFR calc non Af Amer: >60 mL/min (ref >60)
GLUCOSE: 92 mg/dL (ref 65–99)
POTASSIUM: 4.2 mmol/L (ref 3.5–5.1)
Sodium: 142 mmol/L (ref 135–145)

## 2015-03-28 LAB — MAGNESIUM: Magnesium: 2.1 mg/dL (ref 1.7–2.4)

## 2015-03-28 MED ORDER — ACETAMINOPHEN 325 MG PO TABS
650.0000 mg | ORAL_TABLET | Freq: Four times a day (QID) | ORAL | Status: DC | PRN
  Administered 2015-03-28 – 2015-03-31 (×5): 650 mg via ORAL
  Filled 2015-03-28 (×6): qty 2

## 2015-03-28 MED ORDER — MAGIC MOUTHWASH
5.0000 mL | Freq: Four times a day (QID) | ORAL | Status: DC
  Administered 2015-03-28 – 2015-03-30 (×6): 5 mL via ORAL
  Filled 2015-03-28 (×16): qty 5

## 2015-03-28 MED ORDER — MAGIC MOUTHWASH W/LIDOCAINE
5.0000 mL | Freq: Four times a day (QID) | ORAL | Status: DC
  Filled 2015-03-28 (×4): qty 5

## 2015-03-28 MED ORDER — LIDOCAINE VISCOUS 2 % MT SOLN
5.0000 mL | Freq: Four times a day (QID) | OROMUCOSAL | Status: DC
  Administered 2015-03-28 – 2015-03-30 (×6): 5 mL via OROMUCOSAL
  Filled 2015-03-28 (×16): qty 5

## 2015-03-28 MED ORDER — NORTRIPTYLINE HCL 25 MG PO CAPS: 50.0000 mg | ORAL_CAPSULE | Freq: Every day | ORAL | Status: DC | PRN

## 2015-03-28 MED ORDER — METOCLOPRAMIDE HCL 5 MG/ML IJ SOLN
5.0000 mg | Freq: Four times a day (QID) | INTRAMUSCULAR | Status: DC
  Administered 2015-03-28 – 2015-03-31 (×12): 5 mg via INTRAVENOUS
  Filled 2015-03-28 (×13): qty 2

## 2015-03-28 MED ORDER — FLUTICASONE PROPIONATE 50 MCG/ACT NA SUSP
2.0000 | Freq: Every day | NASAL | Status: DC
  Administered 2015-03-28 – 2015-03-29 (×2): 2 via NASAL
  Filled 2015-03-28: qty 16

## 2015-03-28 MED ORDER — ACETAMINOPHEN 325 MG PO TABS
650.0000 mg | ORAL_TABLET | Freq: Once | ORAL | Status: AC
  Administered 2015-03-28: 650 mg via ORAL
  Filled 2015-03-28: qty 2

## 2015-03-28 MED ORDER — IPRATROPIUM BROMIDE 0.06 % NA SOLN
2.0000 | Freq: Two times a day (BID) | NASAL | Status: DC
  Administered 2015-03-28 – 2015-03-30 (×4): 2 via NASAL
  Filled 2015-03-28: qty 15

## 2015-03-28 MED ORDER — DEXTROSE 5 % IV SOLN
1.0000 g | INTRAVENOUS | Status: DC
  Administered 2015-03-28 – 2015-03-30 (×3): 1 g via INTRAVENOUS
  Filled 2015-03-28 (×5): qty 10

## 2015-03-28 NOTE — Progress Notes (Signed)
Paged Dr. Leslye Peer, patient running fever and no orders for Tylenol.  Patient states she takes at home without any reactions.

## 2015-03-28 NOTE — Progress Notes (Signed)
Patient ID: Erin Richards, female   DOB: 1966/01/23, 50 y.o.   MRN: [id number] [facility] PROGRESS NOTE  TYIANA MACCHIO [id number] DOB: 10-Jan-1966 DOA: 03/26/2015 PCP: Margarita Rana, MD  HPI/Subjective: Patient states that she has nausea vomiting abdominal pain. No further diarrhea. As per the nursing staff, she has a lot of nasal drainage and spitting up rather than actual vomiting. I went back to the patient and talked to her about this and she says she has a lot of nasal drainage.  Objective: Filed Vitals:   03/27/15 2043 03/28/15 0503  BP: 115/53 96/46  Pulse: 73 64  Temp: 98.8 F (37.1 C) 100.2 F (37.9 C)  Resp: 24 16    Filed Weights   03/26/15 2004 03/27/15 0134  Weight: 63.504 kg (140 lb) 62.46 kg (137 lb 11.2 oz)    ROS: Review of Systems  Constitutional: Negative for fever and chills.  Eyes: Negative for blurred vision.  Respiratory: Negative for cough and shortness of breath.   Cardiovascular: Negative for chest pain.  Gastrointestinal: Positive for nausea, vomiting and abdominal pain. Negative for diarrhea and constipation.  Genitourinary: Negative for dysuria.  Musculoskeletal: Negative for joint pain.  Neurological: Positive for headaches. Negative for dizziness.   Exam: Physical Exam  Constitutional: She is oriented to person, place, and time.  HENT:  Nose: No mucosal edema.  Mouth/Throat: No oropharyngeal exudate or posterior oropharyngeal edema.  Eyes: Conjunctivae, EOM and lids are normal. Pupils are equal, round, and reactive to light.  Neck: No JVD present. Carotid bruit is not present. No edema present. No thyroid mass and no thyromegaly present.  Cardiovascular: S1 normal and S2 normal.  Exam reveals no gallop.   Murmur heard.  Systolic murmur is present with a grade of 2/6  Pulses:      Dorsalis pedis pulses are 2+ on the right side, and 2+ on the left side.  Respiratory: No respiratory distress. She has no wheezes. She has no  rhonchi. She has no rales.  GI: Soft. Bowel sounds are normal. There is generalized tenderness.  Musculoskeletal:       Right ankle: She exhibits no swelling.       Left ankle: She exhibits no swelling.  Lymphadenopathy:    She has no cervical adenopathy.  Neurological: She is alert and oriented to person, place, and time. No cranial nerve deficit.  Skin: Skin is warm. No rash noted. Nails show no clubbing.  Psychiatric: She has a normal mood and affect.      Data Reviewed: Basic Metabolic Panel:  Recent Labs Lab 03/26/15 2014 03/27/15 0222 03/28/15 0956  NA 140 139 142  K 4.0 3.9 4.2  CL 107 111 113*  CO2 25 24 22   GLUCOSE 124* 114* 92  BUN 11 14 21*  CREATININE 0.75 0.78 0.66  CALCIUM 9.4 8.1* 8.3*  MG  --   --  2.1   Liver Function Tests:  Recent Labs Lab 03/26/15 2014  AST 23  ALT 15  ALKPHOS 66  BILITOT 1.1  PROT 7.9  ALBUMIN 4.3    Recent Labs Lab 03/26/15 2014  LIPASE 21   CBC:  Recent Labs Lab 03/26/15 2014 03/27/15 0222  WBC 6.7 5.9  HGB 13.3 11.1*  HCT 39.7 33.1*  MCV 78.7* 77.5*  PLT 128* 112*     Recent Results (from the past 240 hour(s))  Rapid Influenza A&B Antigens ([facility] only)     Status: None   Collection Time: 03/26/15  8:56 PM  Result Value Ref Range Status   Influenza A [facility]) NOT DETECTED  Final   Influenza B [facility]) NOT DETECTED  Final     Studies: Dg Chest 2 View  03/26/2015  CLINICAL DATA:  Vomiting.  Fever and chills. EXAM: CHEST  2 VIEW COMPARISON:  10/12/2014 FINDINGS: Pediatric size sternotomy wires observed. Heart size within normal limits. While I do not show shunt vascularity currently, there appear to be perihilar lobularity/nodularity bilaterally which is chronic and thought to potentially relate to small pulmonary artery aneurysms given the appearance for example on 09/21/2013. By report the patient has a history of ASD. The lungs appear otherwise clear.  No pleural effusion. IMPRESSION: 1. Central  lobularity/nodularity along the pulmonary arteries, more notable on the right, and believed to be chronic. 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 Pulmonary arterial aneurysms can occur from a variety of conditions, including left right shunts. Pediatric size sternotomy wires are observed. 2. No acute pulmonary findings. Electronically Signed   By: Van Clines M.D.   On: 03/26/2015 21:05    Scheduled Meds: . cefTRIAXone (ROCEPHIN)  IV  1 g Intravenous Q24H  . citalopram  10 mg Oral Daily  . enoxaparin (LOVENOX) injection  40 mg Subcutaneous Q24H  . fluticasone  2 spray Each Nare Daily  . gabapentin  300 mg Oral Daily  . ipratropium  2 spray Nasal BID  . magic mouthwash  5 mL Oral QID   And  . lidocaine  5 mL Mouth/Throat QID  . metoCLOPramide (REGLAN) injection  5 mg Intravenous 4 times per day  . pantoprazole (PROTONIX) IV  40 mg Intravenous Q24H   Continuous Infusions: . sodium chloride 125 mL/hr at 03/28/15 1037    Assessment/Plan:  1. Generalized abdominal pain with nausea vomiting diarrhea. Patient also had a fever. Supportive care. Continue IV fluid hydration. The patient has not had any further diarrhea. This could be nasal drainage that she's actually spitting up. Start IV Rocephin. Continue nasal sprays. When necessary nausea medication. 2. Headache- when necessary nortriptyline 3. History of IBS 4. Depression on Celexa 5. ASD history  Code Status:     Code Status Orders        Start     Ordered   03/27/15 0129  Full code   Continuous     03/27/15 0128    Code Status History    Date Active Date Inactive Code Status Order ID Comments User Context   10/06/2014  2:31 PM 10/12/2014  5:19 PM Full Code [id number]  Idelle Crouch, MD Inpatient   06/03/2014  5:09 PM 06/04/2014  5:20 PM Full Code [id number]  Fritzi Mandes, MD ED     Family Communication: Spoke with  husband on phone Disposition Plan: Once she is able to tolerates diet and stops vomiting sheet potentially go home  Time spent: 20 minutes  [place], [place] [facility]

## 2015-03-29 ENCOUNTER — Observation Stay: Payer: Medicaid Other

## 2015-03-29 DIAGNOSIS — Z888 Allergy status to other drugs, medicaments and biological substances status: Secondary | ICD-10-CM | POA: Diagnosis not present

## 2015-03-29 DIAGNOSIS — B3781 Candidal esophagitis: Secondary | ICD-10-CM | POA: Diagnosis present

## 2015-03-29 DIAGNOSIS — Z8774 Personal history of (corrected) congenital malformations of heart and circulatory system: Secondary | ICD-10-CM | POA: Diagnosis not present

## 2015-03-29 DIAGNOSIS — J90 Pleural effusion, not elsewhere classified: Secondary | ICD-10-CM | POA: Diagnosis present

## 2015-03-29 DIAGNOSIS — J9811 Atelectasis: Secondary | ICD-10-CM | POA: Diagnosis present

## 2015-03-29 DIAGNOSIS — Z8249 Family history of ischemic heart disease and other diseases of the circulatory system: Secondary | ICD-10-CM | POA: Diagnosis not present

## 2015-03-29 DIAGNOSIS — F329 Major depressive disorder, single episode, unspecified: Secondary | ICD-10-CM | POA: Diagnosis present

## 2015-03-29 DIAGNOSIS — R112 Nausea with vomiting, unspecified: Secondary | ICD-10-CM | POA: Diagnosis present

## 2015-03-29 DIAGNOSIS — Z90710 Acquired absence of both cervix and uterus: Secondary | ICD-10-CM | POA: Diagnosis not present

## 2015-03-29 DIAGNOSIS — Z853 Personal history of malignant neoplasm of breast: Secondary | ICD-10-CM | POA: Diagnosis not present

## 2015-03-29 DIAGNOSIS — R1110 Vomiting, unspecified: Secondary | ICD-10-CM | POA: Diagnosis present

## 2015-03-29 DIAGNOSIS — R188 Other ascites: Secondary | ICD-10-CM | POA: Diagnosis present

## 2015-03-29 DIAGNOSIS — Z885 Allergy status to narcotic agent status: Secondary | ICD-10-CM | POA: Diagnosis not present

## 2015-03-29 DIAGNOSIS — Z7951 Long term (current) use of inhaled steroids: Secondary | ICD-10-CM | POA: Diagnosis not present

## 2015-03-29 DIAGNOSIS — J329 Chronic sinusitis, unspecified: Secondary | ICD-10-CM | POA: Diagnosis present

## 2015-03-29 DIAGNOSIS — J302 Other seasonal allergic rhinitis: Secondary | ICD-10-CM | POA: Diagnosis present

## 2015-03-29 DIAGNOSIS — Z886 Allergy status to analgesic agent status: Secondary | ICD-10-CM | POA: Diagnosis not present

## 2015-03-29 DIAGNOSIS — K589 Irritable bowel syndrome without diarrhea: Secondary | ICD-10-CM | POA: Diagnosis present

## 2015-03-29 DIAGNOSIS — Z9889 Other specified postprocedural states: Secondary | ICD-10-CM | POA: Diagnosis not present

## 2015-03-29 DIAGNOSIS — Z79899 Other long term (current) drug therapy: Secondary | ICD-10-CM | POA: Diagnosis not present

## 2015-03-29 DIAGNOSIS — Z85038 Personal history of other malignant neoplasm of large intestine: Secondary | ICD-10-CM | POA: Diagnosis not present

## 2015-03-29 DIAGNOSIS — E860 Dehydration: Secondary | ICD-10-CM | POA: Diagnosis present

## 2015-03-29 DIAGNOSIS — A084 Viral intestinal infection, unspecified: Secondary | ICD-10-CM | POA: Diagnosis present

## 2015-03-29 LAB — BASIC METABOLIC PANEL
Anion gap: 7 (ref 5–15)
BUN: 17 mg/dL (ref 6–20)
CALCIUM: 7.7 mg/dL — AB (ref 8.9–10.3)
CO2: 20 mmol/L — AB (ref 22–32)
CREATININE: 0.7 mg/dL (ref 0.44–1.00)
Chloride: 115 mmol/L — ABNORMAL HIGH (ref 101–111)
GFR calc non Af Amer: >60 mL/min (ref >60)
Glucose, Bld: 98 mg/dL (ref 65–99)
Potassium: 3.7 mmol/L (ref 3.5–5.1)
SODIUM: 142 mmol/L (ref 135–145)

## 2015-03-29 MED ORDER — ESTRADIOL 1 MG PO TABS
0.5000 mg | ORAL_TABLET | Freq: Every day | ORAL | Status: DC
  Administered 2015-03-29 – 2015-03-31 (×3): 0.5 mg via ORAL
  Filled 2015-03-29 (×3): qty 1

## 2015-03-29 MED ORDER — NYSTATIN 100000 UNIT/ML MT SUSP
5.0000 mL | Freq: Four times a day (QID) | OROMUCOSAL | Status: DC
  Administered 2015-03-29 – 2015-03-31 (×6): 500000 [IU] via ORAL
  Filled 2015-03-29 (×7): qty 5

## 2015-03-29 MED ORDER — MONTELUKAST SODIUM 10 MG PO TABS
10.0000 mg | ORAL_TABLET | Freq: Every day | ORAL | Status: DC
  Administered 2015-03-29: 10 mg via ORAL
  Filled 2015-03-29: qty 1

## 2015-03-29 NOTE — Progress Notes (Signed)
[facility] [place] NAME: Max Waren    MR#:  [id number]  DATE OF BIRTH:  [birth year]  SUBJECTIVE:  Complains of mouth pain this morning, painful swallowing  REVIEW OF SYSTEMS:  CONSTITUTIONAL: No fever positive chills, fatigue or weakness.  EYES: No blurred or double vision.  EARS, NOSE, AND THROAT: No tinnitus or ear pain.  RESPIRATORY: No cough, shortness of breath, wheezing or hemoptysis.  CARDIOVASCULAR: No chest pain, orthopnea, edema.  GASTROINTESTINAL: No nausea, vomiting, diarrhea or abdominal pain.  GENITOURINARY: No dysuria, hematuria.  ENDOCRINE: No polyuria, nocturia,  HEMATOLOGY: No anemia, easy bruising or bleeding SKIN: No rash or lesion. MUSCULOSKELETAL: No joint pain or arthritis.   NEUROLOGIC: No tingling, numbness, weakness.  PSYCHIATRY: No anxiety or depression.   DRUG ALLERGIES:   Allergies  Allergen Reactions  . Acetaminophen-Codeine Nausea And Vomiting  . Aspartame Other (See Comments)    Reaction: unknown  . Aspartame And Phenylalanine Nausea And Vomiting  . Antiseptic Oral Rinse  [Cetylpyridinium Chloride] Other (See Comments)    UNK  . Biaxin [Clarithromycin] Nausea And Vomiting  . Chlorhexidine Other (See Comments)  . Chlorhexidine Gluconate Nausea And Vomiting  . Clindamycin Diarrhea and Nausea And Vomiting  . Clindamycin/Lincomycin Nausea And Vomiting  . Codeine Itching and Nausea And Vomiting  . Dextromethorphan Hbr Other (See Comments)    Reaction: unknown  . Dilaudid [Hydromorphone Hcl] Nausea And Vomiting  . Doxycycline Nausea And Vomiting  . Fentanyl Nausea And Vomiting  . Germanium Other (See Comments)  . Hydrocodone Nausea And Vomiting  . Ketorolac Other (See Comments)  . Levofloxacin Other (See Comments)    GI upset. GI upset  . Mefenamic Acid Nausea And Vomiting  . Metformin And Related Nausea And Vomiting  . Metronidazole Diarrhea and Nausea And Vomiting  . Morphine And  Related Nausea And Vomiting  . Morphine Sulfate Itching and Nausea And Vomiting  . Moxifloxacin Swelling  . Moxifloxacin Hcl In Nacl Other (See Comments)    Reaction: unknown  . Nitrofuran Derivatives Other (See Comments)    Reaction: unknown  . Nitrofurantoin Nausea And Vomiting and Other (See Comments)  . Nitrofurantoin Monohyd Macro Other (See Comments)    Reaction: unknown  . Nsaids Other (See Comments)    Reaction: unknown  . Other Other (See Comments)  . Oxycodone-Acetaminophen Nausea And Vomiting  . Periguard [Dimethicone] Nausea And Vomiting  . Phenothiazines Nausea And Vomiting  . Pioglitazone Nausea And Vomiting  . Quinidine Nausea And Vomiting  . Quinine Derivatives Nausea And Vomiting  . Quinolones Nausea And Vomiting  . Rumex Crispus Other (See Comments)  . Tetracycline Nausea And Vomiting  . Tetracyclines & Related Nausea And Vomiting  . Toradol [Ketorolac Tromethamine] Nausea And Vomiting  . Tramadol Nausea And Vomiting  . Tussin [Guaifenesin] Nausea And Vomiting  . Tussionex Pennkinetic Er [Hydrocod Polst-Cpm Polst Er] Nausea And Vomiting  . Buprenorphine Hcl Nausea And Vomiting  . Hydrocodone-Chlorpheniramine Nausea And Vomiting  . Hydromorphone Nausea And Vomiting and Other (See Comments)    UNK  . Lincomycin Hcl Nausea And Vomiting  . Phenylalanine Nausea And Vomiting    VITALS:  Blood pressure 108/56, pulse 60, temperature 98.9 F (37.2 C), temperature source Oral, resp. rate 17, height 4\' 9"  (1.448 m), weight 66.454 kg (146 lb 8.1 oz), SpO2 77 %.  PHYSICAL EXAMINATION:  VITAL SIGNS: Filed Vitals:   03/29/15 0559 03/29/15 0800  BP:  108/56  Pulse:  60  Temp:  100.1 F (37.8 C) 98.9 F (37.2 C)  Resp:  20   GENERAL:50 y.o.female currently in no acute distress.  HEAD: Normocephalic, atraumatic.  EYES: Pupils equal, round, reactive to light. Extraocular muscles intact. No scleral icterus.  MOUTH: Moist mucosal membrane. Dentition intact. No abscess  noted. Oral ulcers over the tongue evidence of thrush EAR, NOSE, THROAT: Clear without exudates. No external lesions.  NECK: Supple. No thyromegaly. No nodules. No JVD.  PULMONARY: Clear to ascultation, without wheeze rails or rhonci. No use of accessory muscles, Good respiratory effort. good air entry bilaterally CHEST: Nontender to palpation.  CARDIOVASCULAR: S1 and S2. Regular rate and rhythm. No murmurs, rubs, or gallops. No edema. Pedal pulses 2+ bilaterally.  GASTROINTESTINAL: Soft, nontender, nondistended. No masses. Positive bowel sounds. No hepatosplenomegaly.  MUSCULOSKELETAL: No swelling, clubbing, or edema. Range of motion full in all extremities.  NEUROLOGIC: Cranial nerves II through XII are intact. No gross focal neurological deficits. Sensation intact. Reflexes intact.  SKIN: No ulceration, lesions, rashes, or cyanosis. Skin warm and dry. Turgor intact.  PSYCHIATRIC: Mood, affect within normal limits. The patient is awake, alert and oriented x 3. Insight, judgment intact.      LABORATORY PANEL:   CBC  Recent Labs Lab 03/27/15 0222  WBC 5.9  HGB 11.1*  HCT 33.1*  PLT 112*   ------------------------------------------------------------------------------------------------------------------  Chemistries   Recent Labs Lab 03/26/15 2014  03/28/15 0956 03/29/15 0521  NA 140  < > 142 142  K 4.0  < > 4.2 3.7  CL 107  < > 113* 115*  CO2 25  < > 22 20*  GLUCOSE 124*  < > 92 98  BUN 11  < > 21* 17  CREATININE 0.75  < > 0.66 0.70  CALCIUM 9.4  < > 8.3* 7.7*  MG  --   --  2.1  --   AST 23  --   --   --   ALT 15  --   --   --   ALKPHOS 66  --   --   --   BILITOT 1.1  --   --   --   < > = values in this interval not displayed. ------------------------------------------------------------------------------------------------------------------  Cardiac Enzymes No results for input(s): TROPONINI in the last 168  hours. ------------------------------------------------------------------------------------------------------------------  RADIOLOGY:  No results found.  EKG:   Orders placed or performed during the hospital encounter of 03/26/15  . ED EKG  . ED EKG    ASSESSMENT AND PLAN:   50 year old Caucasian female admitted 03/26/15 with intractable nausea and vomiting.   1. Odynophagia; Nystatin if no improvement will get GI involvement patient has no actual reason for esophageal candidiasis so I doubt that this is the issue 2.fever:no clear etiology at this time question sinusitis given desaturation will check chest x-ray today 3. Headache unspecified nortriptyline 4. Depression Celexa 5.Venous thromboembolism prophylactic:Lovenox     All the records are reviewed and case discussed with Care Management/Social Workerr. Management plans discussed with the patient, family and they are in agreement.  CODE STATUS: full  TOTAL TIME TAKING CARE OF THIS PATIENT: 33 minutes.   POSSIBLE D/C IN 2-3 DAYS, DEPENDING ON CLINICAL CONDITION.   Hower,  Karenann Cai.D on 03/29/2015 at 1:47 PM  Between 7am to 6pm - Pager - [phone]  After 6pm: House Pager: - [facility]  Office  [phone]  CC: Primary care physician; Margarita Rana, MD

## 2015-03-30 ENCOUNTER — Encounter: Payer: Self-pay | Admitting: Radiology

## 2015-03-30 ENCOUNTER — Inpatient Hospital Stay: Payer: Medicaid Other

## 2015-03-30 LAB — CBC
HCT: 31.1 % — ABNORMAL LOW (ref 35.0–47.0)
HEMATOCRIT: 30.1 % — AB (ref 35.0–47.0)
HEMOGLOBIN: 10.4 g/dL — AB (ref 12.0–16.0)
Hemoglobin: 9.8 g/dL — ABNORMAL LOW (ref 12.0–16.0)
MCH: 25.6 pg — ABNORMAL LOW (ref 26.0–34.0)
MCH: 26.2 pg (ref 26.0–34.0)
MCHC: 32.7 g/dL (ref 32.0–36.0)
MCHC: 33.3 g/dL (ref 32.0–36.0)
MCV: 78.4 fL — ABNORMAL LOW (ref 80.0–100.0)
MCV: 78.5 fL — ABNORMAL LOW (ref 80.0–100.0)
PLATELETS: 96 10*3/uL — AB (ref 150–440)
Platelets: 90 10*3/uL — ABNORMAL LOW (ref 150–440)
RBC: 3.84 MIL/uL (ref 3.80–5.20)
RBC: 3.96 MIL/uL (ref 3.80–5.20)
RDW: 15.6 % — AB (ref 11.5–14.5)
RDW: 15.7 % — AB (ref 11.5–14.5)
WBC: 4.1 10*3/uL (ref 3.6–11.0)
WBC: 5.2 10*3/uL (ref 3.6–11.0)

## 2015-03-30 LAB — C DIFFICILE QUICK SCREEN W PCR REFLEX
C DIFFICILE (CDIFF) INTERP: NEGATIVE
C DIFFICILE (CDIFF) TOXIN: NEGATIVE
C DIFFICLE (CDIFF) ANTIGEN: NEGATIVE

## 2015-03-30 MED ORDER — IOHEXOL 300 MG/ML  SOLN
100.0000 mL | Freq: Once | INTRAMUSCULAR | Status: AC | PRN
  Administered 2015-03-30: 100 mL via INTRAVENOUS

## 2015-03-30 MED ORDER — IOHEXOL 240 MG/ML SOLN
50.0000 mL | Freq: Once | INTRAMUSCULAR | Status: AC | PRN
  Administered 2015-03-30: 50 mL via ORAL

## 2015-03-30 NOTE — Progress Notes (Signed)
SVN given at RN request due to increased O2 req. Pt states she is not sob.  BS clear & diminished at bases. States she has not been working on I.S.  I re-oriented her to the I.S. And reinforced the importance of deep breathing at offset and prevent atelectasis.  She needs repetitive encouragement.

## 2015-03-30 NOTE — Progress Notes (Signed)
Dr Lavetta Nielsen was made aware of pt stating she had a BM with red bright blood. No new orders for now, just to keep a record all BM colors.

## 2015-03-30 NOTE — Progress Notes (Signed)
MD paged to notify of pt sats dropping to 77% O2 increased to 4L and will go up to 91 and then drops back to 84% and cont to fluctuate Ordered to give prn breathing treatment and cont to monitor pt. Also notified of pt c/o not being able to void but feeling as if she is full. Will bladder scan and if results greater than 400 MD ordered prn I&O cath times one
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
## 2015-03-30 NOTE — Progress Notes (Signed)
[place] [facility] [place] NAME: Erin Richards    MR#:  [id number]  DATE OF BIRTH:  Jun 07, 1965  SUBJECTIVE:  Mouth pain still present however somewhat improved Now having diarrhea, greater than 3 episodes overnight complaining of abdominal cramping One episode bright red blood per rectum this morning  REVIEW OF SYSTEMS:  CONSTITUTIONAL: No fever positive chills, fatigue or weakness.  EYES: No blurred or double vision.  EARS, NOSE, AND THROAT: No tinnitus or ear pain.  RESPIRATORY: No cough, shortness of breath, wheezing or hemoptysis.  CARDIOVASCULAR: No chest pain, orthopnea, edema.  GASTROINTESTINAL: No nausea, vomiting,positive diarrhea and abdominal pain.  GENITOURINARY: No dysuria, hematuria.  ENDOCRINE: No polyuria, nocturia,  HEMATOLOGY: No anemia, easy bruising or bleeding SKIN: No rash or lesion. MUSCULOSKELETAL: No joint pain or arthritis.   NEUROLOGIC: No tingling, numbness, weakness.  PSYCHIATRY: No anxiety or depression.   DRUG ALLERGIES:   Allergies  Allergen Reactions  . Acetaminophen-Codeine Nausea And Vomiting  . Aspartame Other (See Comments)    Reaction: unknown  . Aspartame And Phenylalanine Nausea And Vomiting  . Antiseptic Oral Rinse  [Cetylpyridinium Chloride] Other (See Comments)    UNK  . Biaxin [Clarithromycin] Nausea And Vomiting  . Chlorhexidine Other (See Comments)  . Chlorhexidine Gluconate Nausea And Vomiting  . Clindamycin Diarrhea and Nausea And Vomiting  . Clindamycin/Lincomycin Nausea And Vomiting  . Codeine Itching and Nausea And Vomiting  . Dextromethorphan Hbr Other (See Comments)    Reaction: unknown  . Dilaudid [Hydromorphone Hcl] Nausea And Vomiting  . Doxycycline Nausea And Vomiting  . Fentanyl Nausea And Vomiting  . Germanium Other (See Comments)  . Hydrocodone Nausea And Vomiting  . Ketorolac Other (See Comments)  . Levofloxacin Other (See Comments)    GI upset. GI upset  .  Mefenamic Acid Nausea And Vomiting  . Metformin And Related Nausea And Vomiting  . Metronidazole Diarrhea and Nausea And Vomiting  . Morphine And Related Nausea And Vomiting  . Morphine Sulfate Itching and Nausea And Vomiting  . Moxifloxacin Swelling  . Moxifloxacin Hcl In Nacl Other (See Comments)    Reaction: unknown  . Nitrofuran Derivatives Other (See Comments)    Reaction: unknown  . Nitrofurantoin Nausea And Vomiting and Other (See Comments)  . Nitrofurantoin Monohyd Macro Other (See Comments)    Reaction: unknown  . Nsaids Other (See Comments)    Reaction: unknown  . Other Other (See Comments)  . Oxycodone-Acetaminophen Nausea And Vomiting  . Periguard [Dimethicone] Nausea And Vomiting  . Phenothiazines Nausea And Vomiting  . Pioglitazone Nausea And Vomiting  . Quinidine Nausea And Vomiting  . Quinine Derivatives Nausea And Vomiting  . Quinolones Nausea And Vomiting  . Rumex Crispus Other (See Comments)  . Tetracycline Nausea And Vomiting  . Tetracyclines & Related Nausea And Vomiting  . Toradol [Ketorolac Tromethamine] Nausea And Vomiting  . Tramadol Nausea And Vomiting  . Tussin [Guaifenesin] Nausea And Vomiting  . Tussionex Pennkinetic Er [Hydrocod Polst-Cpm Polst Er] Nausea And Vomiting  . Buprenorphine Hcl Nausea And Vomiting  . Hydrocodone-Chlorpheniramine Nausea And Vomiting  . Hydromorphone Nausea And Vomiting and Other (See Comments)    UNK  . Lincomycin Hcl Nausea And Vomiting  . Phenylalanine Nausea And Vomiting    VITALS:  Blood pressure 103/46, pulse 58, temperature 98.7 F (37.1 C), temperature source Oral, resp. rate 18, height 4\' 9"  (1.448 m), weight 66.454 kg (146 lb 8.1 oz), SpO2 91 %.  PHYSICAL EXAMINATION:  VITAL SIGNS: Filed Vitals:   03/29/15 2106 03/30/15 0801  BP: 106/53 103/46  Pulse: 58 58  Temp: 99.3 F (37.4 C) 98.7 F (37.1 C)  Resp: 18    GENERAL:50 y.o.female currently in no acute distress.  HEAD: Normocephalic, atraumatic.   EYES: Pupils equal, round, reactive to light. Extraocular muscles intact. No scleral icterus.  MOUTH: Moist mucosal membrane. Dentition intact. No abscess noted. Oral ulcers over the tongue evidence of thrush EAR, NOSE, THROAT: Clear without exudates. No external lesions.  NECK: Supple. No thyromegaly. No nodules. No JVD.  PULMONARY: Clear to ascultation, without wheeze rails or rhonci. No use of accessory muscles, Good respiratory effort. good air entry bilaterally CHEST: Nontender to palpation.  CARDIOVASCULAR: S1 and S2. Regular rate and rhythm. No murmurs, rubs, or gallops. No edema. Pedal pulses 2+ bilaterally.  GASTROINTESTINAL: Soft, minimal tenderness to palpation periumbilical without rebound or guarding, nondistended. No masses. Positive bowel sounds. No hepatosplenomegaly.  MUSCULOSKELETAL: No swelling, clubbing, or edema. Range of motion full in all extremities.  NEUROLOGIC: Cranial nerves II through XII are intact. No gross focal neurological deficits. Sensation intact. Reflexes intact.  SKIN: No ulceration, lesions, rashes, or cyanosis. Skin warm and dry. Turgor intact.  PSYCHIATRIC: Mood, affect within normal limits. The patient is awake, alert and oriented x 3. Insight, judgment intact.      LABORATORY PANEL:   CBC  Recent Labs Lab 03/30/15 0512  WBC 4.1  HGB 9.8*  HCT 30.1*  PLT 90*   ------------------------------------------------------------------------------------------------------------------  Chemistries   Recent Labs Lab 03/26/15 2014  03/28/15 0956 03/29/15 0521  NA 140  < > 142 142  K 4.0  < > 4.2 3.7  CL 107  < > 113* 115*  CO2 25  < > 22 20*  GLUCOSE 124*  < > 92 98  BUN 11  < > 21* 17  CREATININE 0.75  < > 0.66 0.70  CALCIUM 9.4  < > 8.3* 7.7*  MG  --   --  2.1  --   AST 23  --   --   --   ALT 15  --   --   --   ALKPHOS 66  --   --   --   BILITOT 1.1  --   --   --   < > = values in this interval not  displayed. ------------------------------------------------------------------------------------------------------------------  Cardiac Enzymes No results for input(s): TROPONINI in the last 168 hours. ------------------------------------------------------------------------------------------------------------------  RADIOLOGY:  Dg Chest Port 1 View  03/29/2015  CLINICAL DATA:  Shortness of breath EXAM: PORTABLE CHEST 1 VIEW COMPARISON:  03/26/2015 FINDINGS: Cardiomegaly with pulmonary vascular congestion and suspected mild interstitial edema. Mild bibasilar opacities, likely atelectasis. Possible small left pleural effusion.  No pneumothorax. IMPRESSION: Suspected mild interstitial edema. Possible small left pleural effusion. Mild bibasilar opacities, likely atelectasis. Electronically Signed   By: Julian Hy M.D.   On: 03/29/2015 16:38    EKG:   Orders placed or performed during the hospital encounter of 03/26/15  . ED EKG  . ED EKG    ASSESSMENT AND PLAN:   50 year old Caucasian female admitted 03/26/15 with intractable nausea and vomiting.   1. Odynophagia; Nystatin -some improvement 2. Diarrhea/bright red blood per rectum:the patient as a history of C. Difficile, now with active diarrhea, abdominal pain intermittent fever concernthis may be the etiology.Check stool studies, CT abdomen and pelvis, gastroenterology consult for this and odynophagia 3. Sinusitis: Ceftriaxone 3/5 4.Headache unspecified nortriptyline 5. Depression Celexa 6. .Venous thromboembolism  prophylactic:hold Lovenox given bleed     All the records are reviewed and case discussed with Care Management/Social Workerr. Management plans discussed with the patient, family and they are in agreement.  CODE STATUS: full  TOTAL TIME TAKING CARE OF THIS PATIENT: 33 minutes.   POSSIBLE D/C IN 2-3 DAYS, DEPENDING ON CLINICAL CONDITION.   Amna Welker,  Karenann Cai.D on 03/30/2015 at 12:01 PM  Between 7am to 6pm -  Pager - [phone]  After 6pm: House Pager: [phone]  [facility]  Office  [phone]  CC: Primary care physician; Margarita Rana, MD

## 2015-03-30 NOTE — Care Management Note (Signed)
Case Management Note  Patient Details  Name: CALISTA ARMWOOD MRN: [id number] Date of Birth: 1965/12/07  Subjective/Objective:      Bright red lower GI bleeding continues, and continues to spike low grade temps. Hx of CHF, IBS, Congenital atrial septal defect. In Sept 2016 received home health services and home oxygen supplied by [facility]. GI consult pending.  Case management will follow for discharge planning.               Action/Plan:   Expected Discharge Date:  03/29/15               Expected Discharge Plan:     In-House Referral:     Discharge planning Services     Post Acute Care Choice:    Choice offered to:     DME Arranged:    DME Agency:     HH Arranged:    [place] Agency:     Status of Service:     Medicare Important Message Given:    Date Medicare IM Given:    Medicare IM give by:    Date Additional Medicare IM Given:    Additional Medicare Important Message give by:     If discussed at [place] of Stay Meetings, dates discussed:    Additional Comments:  Koven Belinsky A, RN 03/30/2015, 4:17 PM

## 2015-03-30 NOTE — Progress Notes (Signed)
Bladder scan results: 52 

## 2015-03-31 LAB — COMPREHENSIVE METABOLIC PANEL
ALK PHOS: 42 U/L (ref 38–126)
ALT: 14 U/L (ref 14–54)
AST: 20 U/L (ref 15–41)
Albumin: 2.7 g/dL — ABNORMAL LOW (ref 3.5–5.0)
Anion gap: 6 (ref 5–15)
BILIRUBIN TOTAL: 0.6 mg/dL (ref 0.3–1.2)
BUN: 10 mg/dL (ref 6–20)
CALCIUM: 7.9 mg/dL — AB (ref 8.9–10.3)
CO2: 22 mmol/L (ref 22–32)
CREATININE: 0.64 mg/dL (ref 0.44–1.00)
Chloride: 116 mmol/L — ABNORMAL HIGH (ref 101–111)
Glucose, Bld: 97 mg/dL (ref 65–99)
Potassium: 3.7 mmol/L (ref 3.5–5.1)
SODIUM: 144 mmol/L (ref 135–145)
TOTAL PROTEIN: 5.4 g/dL — AB (ref 6.5–8.1)

## 2015-03-31 LAB — GASTROINTESTINAL PANEL BY PCR, STOOL (REPLACES STOOL CULTURE)
Adenovirus F40/41: NOT DETECTED
Astrovirus: NOT DETECTED
CAMPYLOBACTER SPECIES: NOT DETECTED
CRYPTOSPORIDIUM: NOT DETECTED
Cyclospora cayetanensis: NOT DETECTED
E. COLI O157: NOT DETECTED
Entamoeba histolytica: NOT DETECTED
Enteroaggregative E coli (EAEC): NOT DETECTED
Enteropathogenic E coli (EPEC): NOT DETECTED
Enterotoxigenic E coli (ETEC): NOT DETECTED
Giardia lamblia: NOT DETECTED
Norovirus GI/GII: NOT DETECTED
PLESIMONAS SHIGELLOIDES: NOT DETECTED
ROTAVIRUS A: NOT DETECTED
SALMONELLA SPECIES: NOT DETECTED
SHIGA LIKE TOXIN PRODUCING E COLI (STEC): NOT DETECTED
SHIGELLA/ENTEROINVASIVE E COLI (EIEC): NOT DETECTED
Sapovirus (I, II, IV, and V): NOT DETECTED
Vibrio cholerae: NOT DETECTED
Vibrio species: NOT DETECTED
YERSINIA ENTEROCOLITICA: NOT DETECTED

## 2015-03-31 LAB — CBC
HCT: 29.9 % — ABNORMAL LOW (ref 35.0–47.0)
Hemoglobin: 9.8 g/dL — ABNORMAL LOW (ref 12.0–16.0)
MCH: 25.5 pg — AB (ref 26.0–34.0)
MCHC: 32.9 g/dL (ref 32.0–36.0)
MCV: 77.5 fL — ABNORMAL LOW (ref 80.0–100.0)
PLATELETS: 103 10*3/uL — AB (ref 150–440)
RBC: 3.86 MIL/uL (ref 3.80–5.20)
RDW: 16.1 % — ABNORMAL HIGH (ref 11.5–14.5)
WBC: 4.6 10*3/uL (ref 3.6–11.0)

## 2015-03-31 MED ORDER — LOPERAMIDE HCL 2 MG PO TABS: 2.0000 mg | ORAL_TABLET | Freq: Four times a day (QID) | ORAL | Status: DC | PRN

## 2015-03-31 MED ORDER — NYSTATIN 100000 UNIT/ML MT SUSP: 5.0000 mL | Freq: Four times a day (QID) | OROMUCOSAL | Status: DC

## 2015-03-31 MED ORDER — AMOXICILLIN-POT CLAVULANATE 875-125 MG PO TABS: 1.0000 | ORAL_TABLET | Freq: Two times a day (BID) | ORAL | Status: DC

## 2015-03-31 MED ORDER — FUROSEMIDE 10 MG/ML IJ SOLN
20.0000 mg | Freq: Once | INTRAMUSCULAR | Status: AC
  Administered 2015-03-31: 20 mg via INTRAVENOUS
  Filled 2015-03-31: qty 2

## 2015-03-31 MED ORDER — MAGIC MOUTHWASH: 5.0000 mL | Freq: Four times a day (QID) | ORAL | Status: DC

## 2015-03-31 NOTE — Discharge Summary (Addendum)
[facility] NAME: Erin Richards    MR#:  [id number]  DATE OF BIRTH:  [birth year]  DATE OF ADMISSION:  03/26/2015 ADMITTING PHYSICIAN: Saundra Shelling, MD  DATE OF DISCHARGE: No discharge date for patient encounter.  PRIMARY CARE PHYSICIAN: Margarita Rana, MD    ADMISSION DIAGNOSIS:  Vomiting and diarrhea [R11.10, R19.7]  DISCHARGE DIAGNOSIS:  Sinusitis  Viral gastroenteritis Acute respiratory failure with hypoxia Atelectasis  SECONDARY DIAGNOSIS:   Past Medical History  Diagnosis Date  . IBS (irritable bowel syndrome)   . 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 Allergy   . Depression     HOSPITAL COURSE:  Erin Richards  is a 50 y.o. female admitted 03/26/2015 with chief complaint nausea vomiting abdominal pain. Please see H&P performed by Dr. Estanislado Pandy for further information. [name] had a waxing waning symptoms during her hospitalization including fevers and shortness of breath. It was noticed earlier in her hospital stay she had lots of nasal congestion as well as drainage started on antibiotics ceftriaxone for sinusitis with improvement of the symptoms. Her abdominal pain nausea vomiting diarrhea have improved. Tested for C. difficile as well as contagious viruses through stool PCR which fortunately turned negative. She'll be offered supportive care in terms of Imodium, her oral intake has improved, she does have oral ulcers and thrush also improving with nystatin. In regards to her respiratory status I believe this is secondary to small pleural effusion and atelectasis secondary to decreased mobility while in the hospital. This may also contribute to intermittent fever. She'll finish her course of antibiotics, home oxygen has been ordered and hopefully she'll be weaning off of this shortly.  DISCHARGE CONDITIONS:   Stable  CONSULTS OBTAINED:  Treatment Team:  Josefine Class, MD  DRUG ALLERGIES:    Allergies  Allergen Reactions  . Acetaminophen-Codeine Nausea And Vomiting  . Aspartame Other (See Comments)    Reaction: unknown  . Aspartame And Phenylalanine Nausea And Vomiting  . Antiseptic Oral Rinse  [Cetylpyridinium Chloride] Other (See Comments)    UNK  . Biaxin [Clarithromycin] Nausea And Vomiting  . Chlorhexidine Other (See Comments)  . Chlorhexidine Gluconate Nausea And Vomiting  . Clindamycin Diarrhea and Nausea And Vomiting  . Clindamycin/Lincomycin Nausea And Vomiting  . Codeine Itching and Nausea And Vomiting  . Dextromethorphan Hbr Other (See Comments)    Reaction: unknown  . Dilaudid [Hydromorphone Hcl] Nausea And Vomiting  . Doxycycline Nausea And Vomiting  . Fentanyl Nausea And Vomiting  . Germanium Other (See Comments)  . Hydrocodone Nausea And Vomiting  . Ketorolac Other (See Comments)  . Levofloxacin Other (See Comments)    GI upset. GI upset  . Mefenamic Acid Nausea And Vomiting  . Metformin And Related Nausea And Vomiting  . Metronidazole Diarrhea and Nausea And Vomiting  . Morphine And Related Nausea And Vomiting  . Morphine Sulfate Itching and Nausea And Vomiting  . Moxifloxacin Swelling  . Moxifloxacin Hcl In Nacl Other (See Comments)    Reaction: unknown  . Nitrofuran Derivatives Other (See Comments)    Reaction: unknown  . Nitrofurantoin Nausea And Vomiting and Other (See Comments)  . Nitrofurantoin Monohyd Macro Other (See Comments)    Reaction: unknown  . Nsaids Other (See Comments)    Reaction: unknown  . Other Other (See Comments)  . Oxycodone-Acetaminophen Nausea And Vomiting  . Periguard [Dimethicone] Nausea And Vomiting  . Phenothiazines Nausea And Vomiting  . Pioglitazone Nausea  And Vomiting  . Quinidine Nausea And Vomiting  . Quinine Derivatives Nausea And Vomiting  . Quinolones Nausea And Vomiting  . Rumex Crispus Other (See Comments)  . Tetracycline Nausea And Vomiting  . Tetracyclines & Related Nausea And Vomiting  .  Toradol [Ketorolac Tromethamine] Nausea And Vomiting  . Tramadol Nausea And Vomiting  . Tussin [Guaifenesin] Nausea And Vomiting  . Tussionex Pennkinetic Er [Hydrocod Polst-Cpm Polst Er] Nausea And Vomiting  . Buprenorphine Hcl Nausea And Vomiting  . Hydrocodone-Chlorpheniramine Nausea And Vomiting  . Hydromorphone Nausea And Vomiting and Other (See Comments)    UNK  . Lincomycin Hcl Nausea And Vomiting  . Phenylalanine Nausea And Vomiting    DISCHARGE MEDICATIONS:   Current Discharge Medication List    START taking these medications   Details  amoxicillin-clavulanate (AUGMENTIN) 875-125 MG tablet Take 1 tablet by mouth 2 (two) times daily. Qty: 4 tablet, Refills: 0    loperamide (IMODIUM A-D) 2 MG tablet Take 1 tablet (2 mg total) by mouth 4 (four) times daily as needed for diarrhea or loose stools. Qty: 30 tablet, Refills: 0    magic mouthwash SOLN Take 5 mLs by mouth 4 (four) times daily. Qty: 100 mL, Refills: 0    nystatin (MYCOSTATIN) 100000 UNIT/ML suspension Take 5 mLs (500,000 Units total) by mouth 4 (four) times daily. Qty: 60 mL, Refills: 0      CONTINUE these medications which have NOT CHANGED   Details  albuterol (PROVENTIL HFA;VENTOLIN HFA) 108 (90 BASE) MCG/ACT inhaler Inhale 1-2 puffs into the lungs every 6 (six) hours as needed for shortness of breath. Qty: 1 Inhaler, Refills: 3    cetirizine (ZYRTEC) 10 MG tablet Take 10 mg by mouth daily.    citalopram (CELEXA) 20 MG tablet Take 10 mg by mouth daily.    dicyclomine (BENTYL) 20 MG tablet Take 20 mg by mouth every 4 (four) hours as needed for spasms.    estradiol (ESTRACE) 0.5 MG tablet Take 0.5 mg by mouth daily.    fluticasone (FLONASE) 50 MCG/ACT nasal spray Place 2 sprays into both nostrils daily as needed for allergies or rhinitis.    gabapentin (NEURONTIN) 300 MG capsule Take 300 mg by mouth daily.     hyoscyamine (LEVSIN, ANASPAZ) 0.125 MG tablet Take 0.125 mg by mouth daily as needed for bladder  spasms or cramping.     Ipratropium-Albuterol (COMBIVENT RESPIMAT) 20-100 MCG/ACT AERS respimat Inhale 1 puff into the lungs every 4 (four) hours as needed for wheezing.    montelukast (SINGULAIR) 10 MG tablet Take 1 tablet by mouth at bedtime.     nortriptyline (PAMELOR) 50 MG capsule Take 1 capsule by mouth daily as needed (headache).     pantoprazole (PROTONIX) 40 MG tablet Take 40 mg by mouth daily.      STOP taking these medications     oseltamivir (TAMIFLU) 75 MG capsule      fidaxomicin (DIFICID) 200 MG TABS tablet      furosemide (LASIX) 40 MG tablet          DISCHARGE INSTRUCTIONS:    DIET:  Regular diet  DISCHARGE CONDITION:  Stable  ACTIVITY:  Activity as tolerated  OXYGEN:  Home Oxygen: Yes.     Oxygen Delivery: 4 liters/min via Patient connected to nasal cannula oxygen  DISCHARGE LOCATION:  home   If you experience worsening of your admission symptoms, develop shortness of breath, life threatening emergency, suicidal or homicidal thoughts you must seek medical attention immediately by calling  911 or calling your MD immediately  if symptoms less severe.  You Must read complete instructions/literature along with all the possible adverse reactions/side effects for all the Medicines you take and that have been prescribed to you. Take any new Medicines after you have completely understood and accpet all the possible adverse reactions/side effects.   Please note  You were cared for by a hospitalist during your hospital stay. If you have any questions about your discharge medications or the care you received while you were in the hospital after you are discharged, you can call the unit and asked to speak with the hospitalist on call if the hospitalist that took care of you is not available. Once you are discharged, your primary care physician will handle any further medical issues. Please note that NO REFILLS for any discharge medications will be authorized once  you are discharged, as it is imperative that you return to your primary care physician (or establish a relationship with a primary care physician if you do not have one) for your aftercare needs so that they can reassess your need for medications and monitor your lab values.    On the day of Discharge:   VITAL SIGNS:  Blood pressure 109/58, pulse 65, temperature 98.7 F (37.1 C), temperature source Oral, resp. rate 19, height 4\' 9"  (1.448 m), weight 66.454 kg (146 lb 8.1 oz), SpO2 93 %.  I/O:   Intake/Output Summary (Last 24 hours) at 03/31/15 1121 Last data filed at 03/31/15 0400  Gross per 24 hour  Intake      0 ml  Output    300 ml  Net   -300 ml    PHYSICAL EXAMINATION:  GENERAL:  50 y.o.-year-old patient lying in the bed with no acute distress.  EYES: Pupils equal, round, reactive to light and accommodation. No scleral icterus. Extraocular muscles intact.  HEENT: Head atraumatic, normocephalic. Oropharynx and nasopharynx clear.  NECK:  Supple, no jugular venous distention. No thyroid enlargement, no tenderness.  LUNGS: Diminished breath sounds bilaterally, no wheezing, rales,rhonchi or crepitation. No use of accessory muscles of respiration.  CARDIOVASCULAR: S1, S2 normal. No murmurs, rubs, or gallops.  ABDOMEN: Soft, non-tender, non-distended. Bowel sounds present. No organomegaly or mass.  EXTREMITIES: No pedal edema, cyanosis, or clubbing.  NEUROLOGIC: Cranial nerves II through XII are intact. Muscle strength 5/5 in all extremities. Sensation intact. Gait not checked.  PSYCHIATRIC: The patient is alert and oriented x 3.  SKIN: No obvious rash, lesion, or ulcer.   DATA REVIEW:   CBC  Recent Labs Lab 03/31/15 0602  WBC 4.6  HGB 9.8*  HCT 29.9*  PLT 103*    Chemistries   Recent Labs Lab 03/28/15 0956  03/31/15 0602  NA 142  < > 144  K 4.2  < > 3.7  CL 113*  < > 116*  CO2 22  < > 22  GLUCOSE 92  < > 97  BUN 21*  < > 10  CREATININE 0.66  < > 0.64   CALCIUM 8.3*  < > 7.9*  MG 2.1  --   --   AST  --   --  20  ALT  --   --  14  ALKPHOS  --   --  42  BILITOT  --   --  0.6  < > = values in this interval not displayed.  Cardiac Enzymes No results for input(s): TROPONINI in the last 168 hours.  Microbiology Results  Results for orders placed  or performed during the hospital encounter of 03/26/15  Rapid Influenza A&B Antigens ([facility] only)     Status: None   Collection Time: 03/26/15  8:56 PM  Result Value Ref Range Status   Influenza A [facility]) NOT DETECTED  Final   Influenza B ([facility]) NOT DETECTED  Final  CULTURE, BLOOD (ROUTINE X 2) w Reflex to PCR ID Panel     Status: None (Preliminary result)   Collection Time: 03/28/15  4:35 PM  Result Value Ref Range Status   Specimen Description BLOOD LEFT ASSIST CONTROL  Final   Special Requests BOTTLES DRAWN AEROBIC AND ANAEROBIC  4CC  Final   Culture NO GROWTH 3 DAYS  Final   Report Status PENDING  Incomplete  CULTURE, BLOOD (ROUTINE X 2) w Reflex to PCR ID Panel     Status: None (Preliminary result)   Collection Time: 03/28/15  4:43 PM  Result Value Ref Range Status   Specimen Description BLOOD RIGHT HAND  Final   Special Requests BOTTLES DRAWN AEROBIC AND ANAEROBIC  1CC  Final   Culture NO GROWTH 3 DAYS  Final   Report Status PENDING  Incomplete  C difficile quick scan w PCR reflex     Status: None   Collection Time: 03/30/15  3:51 PM  Result Value Ref Range Status   C Diff antigen NEGATIVE NEGATIVE Final   C Diff toxin NEGATIVE NEGATIVE Final   C Diff interpretation Negative for C. difficile  Final  Gastrointestinal Panel by PCR , Stool     Status: None   Collection Time: 03/31/15  7:42 AM  Result Value Ref Range Status   Campylobacter species NOT DETECTED NOT DETECTED Final   Plesimonas shigelloides NOT DETECTED NOT DETECTED Final   Salmonella species NOT DETECTED NOT DETECTED Final   Yersinia enterocolitica NOT DETECTED NOT DETECTED Final   Vibrio species NOT DETECTED NOT  DETECTED Final   Vibrio cholerae NOT DETECTED NOT DETECTED Final   Enteroaggregative E coli (EAEC) NOT DETECTED NOT DETECTED Final   Enteropathogenic E coli (EPEC) NOT DETECTED NOT DETECTED Final   Enterotoxigenic E coli (ETEC) NOT DETECTED NOT DETECTED Final   Shiga like toxin producing E coli (STEC) NOT DETECTED NOT DETECTED Final   E. coli O157 NOT DETECTED NOT DETECTED Final   Shigella/Enteroinvasive E coli (EIEC) NOT DETECTED NOT DETECTED Final   Cryptosporidium NOT DETECTED NOT DETECTED Final   Cyclospora cayetanensis NOT DETECTED NOT DETECTED Final   Entamoeba histolytica NOT DETECTED NOT DETECTED Final   Giardia lamblia NOT DETECTED NOT DETECTED Final   Adenovirus F40/41 NOT DETECTED NOT DETECTED Final   Astrovirus NOT DETECTED NOT DETECTED Final   Norovirus GI/GII NOT DETECTED NOT DETECTED Final   Rotavirus A NOT DETECTED NOT DETECTED Final   Sapovirus (I, II, IV, and V) NOT DETECTED NOT DETECTED Final    RADIOLOGY:  Ct Abdomen Pelvis W Contrast  03/30/2015  CLINICAL DATA:  Abdominal and pelvic pain for 3 days. EXAM: CT ABDOMEN AND PELVIS WITH CONTRAST TECHNIQUE: Multidetector CT imaging of the abdomen and pelvis was performed using the standard protocol following bolus administration of intravenous contrast. CONTRAST:  126mL OMNIPAQUE IOHEXOL 300 MG/ML  SOLN COMPARISON:  August 21, 2014 FINDINGS: Bilateral pleural effusions with associated atelectasis identified. Lung bases otherwise normal. No free air. There is free fluid in the pelvis as well as a small amount of free fluid in the abdomen. Visualized portal veins are normal. The liver is unremarkable. The gallbladder is distended. There is  also mild irregularity of the gallbladder wall with probable mild pericholecystic fluid. There is also mild increased attenuation in the fat adjacent to the superior aspect of the gallbladder. The spleen measures 12 cm in craniocaudal dimension which is borderline and similar to slightly larger in  the interval. The adrenal glands are within normal limits. The pancreas is unremarkable. No renal stones, masses, or perinephric stranding. No aneurysm or suspicious adenopathy. The stomach and small bowel are normal. Colon is normal in appearance. The appendix is poorly seen but there is no secondary evidence for appendicitis. There is mild increased attenuation in the subcutaneous fat suggesting edema. Free fluid seen in the pelvis. No adenopathy. The patient is status post hysterectomy. The bladder is normal. No suspicious pelvic mass. No acute bony abnormalities. Delayed images demonstrate no filling defects within the opacified portions of the upper collecting systems. IMPRESSION: 1. Gallbladder distention. There is a small amount of fluid adjacent to the gallbladder. Whether this is arising from gallbladder pathology or due to the mild ascites in the abdomen and pelvis is unclear. There is also suggested mild prominence of the gallbladder wall. An ultrasound should be considered to better evaluate the gallbladder. 2. Pleural effusions.  Mild ascites in the abdomen and pelvis. Electronically Signed   By: Dorise Bullion III M.D   On: 03/30/2015 14:03   Dg Chest Port 1 View  03/29/2015  CLINICAL DATA:  Shortness of breath EXAM: PORTABLE CHEST 1 VIEW COMPARISON:  03/26/2015 FINDINGS: Cardiomegaly with pulmonary vascular congestion and suspected mild interstitial edema. Mild bibasilar opacities, likely atelectasis. Possible small left pleural effusion.  No pneumothorax. IMPRESSION: Suspected mild interstitial edema. Possible small left pleural effusion. Mild bibasilar opacities, likely atelectasis. Electronically Signed   By: Julian Hy M.D.   On: 03/29/2015 16:38     Management plans discussed with the patient, family and they are in agreement.  CODE STATUS:     Code Status Orders        Start     Ordered   03/27/15 0129  Full code   Continuous     03/27/15 0128    Code Status History     Date Active Date Inactive Code Status Order ID Comments User Context   10/06/2014  2:31 PM 10/12/2014  5:19 PM Full Code [id number]  Idelle Crouch, MD Inpatient   06/03/2014  5:09 PM 06/04/2014  5:20 PM Full Code [id number]  Fritzi Mandes, MD ED      TOTAL TIME TAKING CARE OF THIS PATIENT: 28 minutes.    Hower,  Karenann Cai.D on 03/31/2015 at 11:21 AM  Between 7am to 6pm - Pager - [phone]  After 6pm go to [email or web address] - password [email or web address] [facility]  [facility]  Office  [phone]  CC: Primary care physician; Margarita Rana, MD

## 2015-03-31 NOTE — Progress Notes (Addendum)
Rest with 3.5L O2=94% Rest without O2=77% Exercise with 3.5L 92% Exercise without O2=74%

## 2015-03-31 NOTE — Progress Notes (Signed)
Patient being discharged to home. Set up for home O2. Iv removed, belongings packed, D/C & Rx instructions given. VSS at time of discharge. Nurse called ride per request of patient.

## 2015-03-31 NOTE — Progress Notes (Signed)
Called and told Dr. Lavetta Nielsen pt spiked a fever of 100.4 & an hour later it is 99.1. He said she's been doing it for days and that he feels she is good to go.

## 2015-03-31 NOTE — Progress Notes (Signed)
GI Note:  Pt reports several small amt of loose stool today. Improved.  C.diff and GI panel negative.    [place] with d/c. She can f/u in GI clinic if loose stools continue.

## 2015-03-31 NOTE — Progress Notes (Signed)
GI PCR, C. difficile negative discontinue enteric cautions Work on trying patient off of oxygen is able to come off can be discharged today

## 2015-03-31 NOTE — Care Management Note (Addendum)
Case Management Note  Patient Details  Name: LATISHIA GUTTING MRN: [id number] Date of Birth: [birth year]  Subjective/Objective:     [facility] does not service Ms [place] address for either DME or Home Health. A referral was faxed to [facility] at [facility] requesting delivery of a portable oxygen tank to Ms [facility] room today, and then a new home oxygen setup. Call to Dr Lavetta Nielsen per Ms [place] has straight Medicaid and that Medicaid does not pay for home health PT.                Action/Plan:   Expected Discharge Date:  03/29/15               Expected Discharge Plan:     In-House Referral:     Discharge planning Services     Post Acute Care Choice:    Choice offered to:     DME Arranged:    DME Agency:     HH Arranged:    [place] Agency:     Status of Service:     Medicare Important Message Given:    Date Medicare IM Given:    Medicare IM give by:    Date Additional Medicare IM Given:    Additional Medicare Important Message give by:     If discussed at [place] of Stay Meetings, dates discussed:    Additional Comments:  Katya Rolston A, RN 03/31/2015, 11:54 AM

## 2015-04-01 ENCOUNTER — Telehealth: Payer: Self-pay | Admitting: Family Medicine

## 2015-04-01 NOTE — Telephone Encounter (Signed)
Patient is wanting to stay on 2L of oxygen. She reports that Dr. Saralyn Pilar said that 4L would be too much for her because of her weight (?). Patient reports that [facility] told her that she needed 4L. Patient wants to know can she stay on 2L? Patient has an appt on Wednesday (04/03/15) for a hospital F/U.

## 2015-04-01 NOTE — Telephone Encounter (Signed)
Pt would like to speak with a nurse or Dr. Venia Minks. Pt was discharged from hospital on 03/31/15 and was treated for an infection per pt. Pt stated that she came home on oxygen at level 2 but [facility] is wanting to change it to level 4 and pt stated that makes her sick. Pt is scheduled for hospital f/u on 04/03/15 but would like to speak with someone before her apt. Thanks TNP

## 2015-04-01 NOTE — Telephone Encounter (Signed)
2 liters should be fine. Thanks.

## 2015-04-01 NOTE — Telephone Encounter (Signed)
Pt advised.   Thanks,   -Laura  

## 2015-04-02 LAB — CULTURE, BLOOD (ROUTINE X 2)
CULTURE: NO GROWTH
Culture: NO GROWTH

## 2015-04-03 ENCOUNTER — Ambulatory Visit
Admission: RE | Admit: 2015-04-03 | Discharge: 2015-04-03 | Disposition: A | Discharge status: Home / Self Care | Payer: Medicaid Other | Source: Ambulatory Visit | Attending: Family Medicine | Admitting: Family Medicine

## 2015-04-03 ENCOUNTER — Encounter: Payer: Self-pay | Admitting: Family Medicine

## 2015-04-03 ENCOUNTER — Ambulatory Visit (INDEPENDENT_AMBULATORY_CARE_PROVIDER_SITE_OTHER): Payer: Medicaid Other | Admitting: Family Medicine

## 2015-04-03 ENCOUNTER — Telehealth: Payer: Self-pay

## 2015-04-03 VITALS — BP 120/60 | HR 76 | Temp 98.4°F | Resp 16 | Wt 150.0 lb

## 2015-04-03 DIAGNOSIS — R918 Other nonspecific abnormal finding of lung field: Secondary | ICD-10-CM | POA: Diagnosis not present

## 2015-04-03 DIAGNOSIS — B370 Candidal stomatitis: Secondary | ICD-10-CM

## 2015-04-03 DIAGNOSIS — K529 Noninfective gastroenteritis and colitis, unspecified: Secondary | ICD-10-CM | POA: Diagnosis not present

## 2015-04-03 DIAGNOSIS — K219 Gastro-esophageal reflux disease without esophagitis: Secondary | ICD-10-CM

## 2015-04-03 DIAGNOSIS — R059 Cough, unspecified: Secondary | ICD-10-CM

## 2015-04-03 DIAGNOSIS — R05 Cough: Secondary | ICD-10-CM

## 2015-04-03 DIAGNOSIS — B3781 Candidal esophagitis: Secondary | ICD-10-CM | POA: Diagnosis not present

## 2015-04-03 DIAGNOSIS — R0902 Hypoxemia: Secondary | ICD-10-CM

## 2015-04-03 DIAGNOSIS — G4734 Idiopathic sleep related nonobstructive alveolar hypoventilation: Secondary | ICD-10-CM | POA: Insufficient documentation

## 2015-04-03 DIAGNOSIS — J189 Pneumonia, unspecified organism: Secondary | ICD-10-CM

## 2015-04-03 MED ORDER — AZITHROMYCIN 250 MG PO TABS: ORAL_TABLET | ORAL | Status: DC

## 2015-04-03 MED ORDER — CLOTRIMAZOLE 10 MG MT TROC: 10.0000 mg | Freq: Every day | OROMUCOSAL | Status: DC

## 2015-04-03 MED ORDER — SUCRALFATE 1 G PO TABS: 1.0000 g | ORAL_TABLET | Freq: Three times a day (TID) | ORAL | Status: DC

## 2015-04-03 NOTE — Telephone Encounter (Signed)
Tried calling; but her mail box is full  Thanks,   -Mickel Baas

## 2015-04-03 NOTE — Telephone Encounter (Signed)
-----   Message from Margarita Rana, MD sent at 04/03/2015 11:40 AM EST ----- May have pneumonia.  Please clarify if patient can take Zpak or another oral antibiotic.  Thanks.
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
## 2015-04-04 ENCOUNTER — Telehealth: Payer: Self-pay

## 2015-04-04 ENCOUNTER — Telehealth: Payer: Self-pay | Admitting: Family Medicine

## 2015-04-04 NOTE — Telephone Encounter (Signed)
PT called wanting to speak with Dr. Venia Minks. I started message then realized call needed to be triaged. Mickel Baas took call. Thanks TNP

## 2015-04-04 NOTE — Telephone Encounter (Signed)
Told her again to go to ER. Thinks getting better because she took Imodium. Told her again. Black tarry stools and abdominal pain needs to be evaluated in ER.

## 2015-04-04 NOTE — Telephone Encounter (Signed)
Pt called this morning stating that she has had diarrhea with "Black tarry stool" and "Terrible abdominal pain".  I advised her she needed to go back to the ER right now to be evaluated.  She refuses and only wants to speak with Dr. Venia Minks.  I told her Dr. Venia Minks does not get in until later this morning, and that I would strongly recommend not waiting and to just go to the ER now.  She again refused and said to "Just have Dr. Venia Minks call me."    Thanks,   -Mickel Baas

## 2015-04-10 ENCOUNTER — Ambulatory Visit (INDEPENDENT_AMBULATORY_CARE_PROVIDER_SITE_OTHER): Payer: Medicaid Other | Admitting: Family Medicine

## 2015-04-10 ENCOUNTER — Encounter: Payer: Self-pay | Admitting: Family Medicine

## 2015-04-10 VITALS — BP 100/60 | HR 68 | Temp 98.1°F | Resp 16 | Ht <= 58 in | Wt 145.0 lb

## 2015-04-10 DIAGNOSIS — K219 Gastro-esophageal reflux disease without esophagitis: Secondary | ICD-10-CM | POA: Diagnosis not present

## 2015-04-10 DIAGNOSIS — B373 Candidiasis of vulva and vagina: Secondary | ICD-10-CM

## 2015-04-10 DIAGNOSIS — J189 Pneumonia, unspecified organism: Secondary | ICD-10-CM | POA: Diagnosis not present

## 2015-04-10 DIAGNOSIS — B3731 Acute candidiasis of vulva and vagina: Secondary | ICD-10-CM

## 2015-04-10 DIAGNOSIS — R05 Cough: Secondary | ICD-10-CM | POA: Diagnosis not present

## 2015-04-10 DIAGNOSIS — R059 Cough, unspecified: Secondary | ICD-10-CM

## 2015-04-10 MED ORDER — FLUCONAZOLE 150 MG PO TABS: 150.0000 mg | ORAL_TABLET | Freq: Once | ORAL | Status: DC

## 2015-04-10 NOTE — Progress Notes (Signed)
Patient ID: Erin Richards, female   DOB: May 15, 1965, 50 y.o.   MRN: [id number]       Patient: Erin Richards Female    DOB: May 25, 1965   51 y.o.   MRN: [id number] Visit Date: 04/10/2015  Today's Provider: Margarita Rana, MD   Chief Complaint  Patient presents with  . Follow-up   Subjective:    HPI  Follow up for abdominal pain and cough  The patient was last seen for this 1 weeks ago. Changes made at last visit include starting carafate.  She reports excellent compliance with treatment. She feels that condition is Improved. She is not having side effects.  Patient reports she is feeling better and feels like she does not need to use her oxygen at home any more. Patient reports we need to cancer her oxygen with [facility]. ------------------------------------------------------------------------------------       Allergies  Allergen Reactions  . Acetaminophen-Codeine Nausea And Vomiting  . Aspartame Other (See Comments)    Reaction: unknown  . Aspartame And Phenylalanine Nausea And Vomiting  . Antiseptic Oral Rinse  [Cetylpyridinium Chloride] Other (See Comments)    UNK  . Biaxin [Clarithromycin] Nausea And Vomiting  . Carafate [Sucralfate]     Pt says her "Stomach hurts" when she takes it.    . Chlorhexidine Other (See Comments)  . Chlorhexidine Gluconate Nausea And Vomiting  . Clindamycin Diarrhea and Nausea And Vomiting  . Clindamycin/Lincomycin Nausea And Vomiting  . Codeine Itching and Nausea And Vomiting  . Dextromethorphan Hbr Other (See Comments)    Reaction: unknown  . Dilaudid [Hydromorphone Hcl] Nausea And Vomiting  . Doxycycline Nausea And Vomiting  . Fentanyl Nausea And Vomiting  . Germanium Other (See Comments)  . Hydrocodone Nausea And Vomiting  . Ketorolac Other (See Comments)  . Levofloxacin Other (See Comments)    GI upset. GI upset  . Mefenamic Acid Nausea And Vomiting  . Metformin And Related Nausea And Vomiting  . Metronidazole Diarrhea and  Nausea And Vomiting  . Morphine And Related Nausea And Vomiting  . Morphine Sulfate Itching and Nausea And Vomiting  . Moxifloxacin Swelling  . Moxifloxacin Hcl In Nacl Other (See Comments)    Reaction: unknown  . Nitrofuran Derivatives Other (See Comments)    Reaction: unknown  . Nitrofurantoin Nausea And Vomiting and Other (See Comments)  . Nitrofurantoin Monohyd Macro Other (See Comments)    Reaction: unknown  . Nsaids Other (See Comments)    Reaction: unknown  . Other Other (See Comments)  . Oxycodone-Acetaminophen Nausea And Vomiting  . Periguard [Dimethicone] Nausea And Vomiting  . Phenothiazines Nausea And Vomiting  . Pioglitazone Nausea And Vomiting  . Quinidine Nausea And Vomiting  . Quinine Derivatives Nausea And Vomiting  . Quinolones Nausea And Vomiting  . Rumex Crispus Other (See Comments)  . Tetracycline Nausea And Vomiting  . Tetracyclines & Related Nausea And Vomiting  . Toradol [Ketorolac Tromethamine] Nausea And Vomiting  . Tramadol Nausea And Vomiting  . Tussin [Guaifenesin] Nausea And Vomiting  . Tussionex Pennkinetic Er [Hydrocod Polst-Cpm Polst Er] Nausea And Vomiting  . Buprenorphine Hcl Nausea And Vomiting  . Hydrocodone-Chlorpheniramine Nausea And Vomiting  . Hydromorphone Nausea And Vomiting and Other (See Comments)    UNK  . Lincomycin Hcl Nausea And Vomiting  . Phenylalanine Nausea And Vomiting   Previous Medications   ALBUTEROL (PROVENTIL HFA;VENTOLIN HFA) 108 (90 BASE) MCG/ACT INHALER    Inhale 1-2 puffs into the lungs every 6 (six) hours as needed  for shortness of breath.   CETIRIZINE (ZYRTEC) 10 MG TABLET    Take 10 mg by mouth daily.   CITALOPRAM (CELEXA) 20 MG TABLET    Take 10 mg by mouth daily.   CLOTRIMAZOLE (MYCELEX) 10 MG TROCHE    Take 1 tablet (10 mg total) by mouth 5 (five) times daily.   DICYCLOMINE (BENTYL) 20 MG TABLET    Take 20 mg by mouth every 4 (four) hours as needed for spasms.   ESTRADIOL (ESTRACE) 0.5 MG TABLET    Take 0.5  mg by mouth daily.   FLUTICASONE (FLONASE) 50 MCG/ACT NASAL SPRAY    Place 2 sprays into both nostrils daily as needed for allergies or rhinitis. Reported on 04/03/2015   GABAPENTIN (NEURONTIN) 300 MG CAPSULE    Take 300 mg by mouth daily.    HYOSCYAMINE (LEVSIN, ANASPAZ) 0.125 MG TABLET    Take 0.125 mg by mouth daily as needed for bladder spasms or cramping.    IPRATROPIUM-ALBUTEROL (COMBIVENT RESPIMAT) 20-100 MCG/ACT AERS RESPIMAT    Inhale 1 puff into the lungs every 4 (four) hours as needed for wheezing. Reported on 04/03/2015   LOPERAMIDE (IMODIUM A-D) 2 MG TABLET    Take 1 tablet (2 mg total) by mouth 4 (four) times daily as needed for diarrhea or loose stools.   MONTELUKAST (SINGULAIR) 10 MG TABLET    Take 1 tablet by mouth at bedtime. Reported on 04/03/2015   NORTRIPTYLINE (PAMELOR) 50 MG CAPSULE    Take 1 capsule by mouth daily as needed (headache).    PANTOPRAZOLE (PROTONIX) 40 MG TABLET    Take 40 mg by mouth daily.    Review of Systems  Constitutional: Negative.   Respiratory: Positive for cough.   Gastrointestinal: Positive for abdominal pain.  Genitourinary: Positive for vaginal discharge.    Social History  Substance Use Topics  . Smoking status: Never Smoker   . Smokeless tobacco: Never Used  . Alcohol Use: No   Objective:   BP 100/60 mmHg  Pulse 68  Temp(Src) 98.1 F (36.7 C) (Oral)  Resp 16  Ht 4\' 10"  (1.473 m)  Wt 145 lb (65.772 kg)  BMI 30.31 kg/m2  SpO2 98%  Physical Exam  Constitutional: She is oriented to person, place, and time. She appears well-developed and well-nourished.  Cardiovascular: Normal rate, regular rhythm and normal heart sounds.   Pulmonary/Chest: Effort normal and breath sounds normal.  Abdominal: Soft. Bowel sounds are normal.  Neurological: She is alert and oriented to person, place, and time.  Psychiatric: She has a normal mood and affect. Her behavior is normal. Judgment and thought content normal.      Assessment & Plan:     1.  Gastroesophageal reflux disease, esophagitis presence not specified Improved. Patient advised to continue current medication and plan of care.  2. Cough Improved. Patient advised to call back in  4 weeks for repeat chest x-ray, for pneumonia. Will d/c home oxygen.   3. Vaginal candidiasis New problem. Patient started on fluconazole 150 mg as below. - fluconazole (DIFLUCAN) 150 MG tablet; Take 1 tablet (150 mg total) by mouth once.  Dispense: 1 tablet; Refill: 0   4. Pneumonia, unspecified laterality, unspecified part of lung Improved. Patient to discontinue using oxygen at home. Patient to call back in 4 weeks to recheck chest x-ray.   Patient seen and examined by Dr. Jerrell Belfast, and note scribed by Philbert Riser. Dimas, CMA. I have reviewed the document for accuracy and completeness and  I agree with above. - Jerrell Belfast, MD      Margarita Rana, MD  [facility]

## 2015-04-15 ENCOUNTER — Telehealth: Payer: Self-pay | Admitting: Family Medicine

## 2015-04-17 ENCOUNTER — Telehealth: Payer: Self-pay | Admitting: Family Medicine

## 2015-04-17 MED ORDER — AZITHROMYCIN 250 MG PO TABS: ORAL_TABLET | ORAL | Status: DC

## 2015-04-17 NOTE — Telephone Encounter (Signed)
Pt was just in recently for cough and congestion.  She took the Zpak, got better and now its came back.    Please advise.  Pt call back   [phone]  Thanks Con Memos

## 2015-04-17 NOTE — Telephone Encounter (Signed)
Ok to refill if patient  Thinks it will help. Thanks.

## 2015-04-17 NOTE — Telephone Encounter (Signed)
Pt advised; she would like to try another round of antibiotics.  Sent RX to [facility] [place].

## 2015-04-22 ENCOUNTER — Telehealth: Payer: Self-pay | Admitting: Family Medicine

## 2015-04-22 NOTE — Telephone Encounter (Signed)
Ok to order. Thanks.   

## 2015-04-22 NOTE — Telephone Encounter (Signed)
Please review. Thanks!  

## 2015-04-22 NOTE — Telephone Encounter (Signed)
Mandy with [facility] would like to get verbal orders to have pt be off the oxygen to see how she does before they pick up the oxygen equipment. Please advise. Thanks TNP

## 2015-05-22 ENCOUNTER — Ambulatory Visit (INDEPENDENT_AMBULATORY_CARE_PROVIDER_SITE_OTHER): Payer: Medicaid Other | Admitting: Family Medicine

## 2015-05-22 ENCOUNTER — Encounter: Payer: Self-pay | Admitting: Family Medicine

## 2015-05-22 VITALS — BP 100/60 | HR 72 | Temp 98.1°F | Resp 16 | Ht <= 58 in | Wt 138.0 lb

## 2015-05-22 DIAGNOSIS — Z1239 Encounter for other screening for malignant neoplasm of breast: Secondary | ICD-10-CM

## 2015-05-22 DIAGNOSIS — M542 Cervicalgia: Secondary | ICD-10-CM | POA: Diagnosis not present

## 2015-05-22 DIAGNOSIS — N898 Other specified noninflammatory disorders of vagina: Secondary | ICD-10-CM | POA: Diagnosis not present

## 2015-05-22 DIAGNOSIS — I959 Hypotension, unspecified: Secondary | ICD-10-CM

## 2015-05-22 DIAGNOSIS — J302 Other seasonal allergic rhinitis: Secondary | ICD-10-CM

## 2015-05-22 DIAGNOSIS — Z13220 Encounter for screening for lipoid disorders: Secondary | ICD-10-CM | POA: Diagnosis not present

## 2015-05-22 DIAGNOSIS — K648 Other hemorrhoids: Secondary | ICD-10-CM

## 2015-05-22 DIAGNOSIS — K625 Hemorrhage of anus and rectum: Secondary | ICD-10-CM

## 2015-05-22 DIAGNOSIS — Z0000 Encounter for general adult medical examination without abnormal findings: Secondary | ICD-10-CM | POA: Diagnosis not present

## 2015-05-22 DIAGNOSIS — Z136 Encounter for screening for cardiovascular disorders: Secondary | ICD-10-CM

## 2015-05-22 MED ORDER — CETIRIZINE HCL 10 MG PO TABS: 10.0000 mg | ORAL_TABLET | Freq: Every day | ORAL | Status: DC

## 2015-05-22 MED ORDER — MONTELUKAST SODIUM 10 MG PO TABS: 10.0000 mg | ORAL_TABLET | Freq: Every day | ORAL | Status: DC

## 2015-05-22 MED ORDER — MELOXICAM 7.5 MG PO TABS: 7.5000 mg | ORAL_TABLET | Freq: Every day | ORAL | Status: DC

## 2015-05-22 MED ORDER — FLUTICASONE PROPIONATE 50 MCG/ACT NA SUSP: 2.0000 | Freq: Every day | NASAL | Status: DC | PRN
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
## 2015-05-24 LAB — COMPREHENSIVE METABOLIC PANEL
A/G RATIO: 1.6 (ref 1.2–2.2)
ALT: 9 IU/L (ref 0–32)
AST: 17 IU/L (ref 0–40)
Albumin: 4.2 g/dL (ref 3.5–5.5)
Alkaline Phosphatase: 64 IU/L (ref 39–117)
BUN/Creatinine Ratio: 15 (ref 9–23)
BUN: 11 mg/dL (ref 6–24)
Bilirubin Total: 0.8 mg/dL (ref 0.0–1.2)
CALCIUM: 9.3 mg/dL (ref 8.7–10.2)
CO2: 23 mmol/L (ref 18–29)
Chloride: 102 mmol/L (ref 96–106)
Creatinine, Ser: 0.72 mg/dL (ref 0.57–1.00)
GFR, EST AFRICAN AMERICAN: 115 mL/min/{1.73_m2} (ref >59)
GFR, EST NON AFRICAN AMERICAN: 100 mL/min/{1.73_m2} (ref >59)
GLOBULIN, TOTAL: 2.6 g/dL (ref 1.5–4.5)
Glucose: 90 mg/dL (ref 65–99)
POTASSIUM: 4.4 mmol/L (ref 3.5–5.2)
Sodium: 140 mmol/L (ref 134–144)
TOTAL PROTEIN: 6.8 g/dL (ref 6.0–8.5)

## 2015-05-24 LAB — CBC WITH DIFFERENTIAL/PLATELET
BASOS: 0 %
Basophils Absolute: 0 10*3/uL (ref 0.0–0.2)
EOS (ABSOLUTE): 0.1 10*3/uL (ref 0.0–0.4)
EOS: 2 %
HEMATOCRIT: 36.2 % (ref 34.0–46.6)
HEMOGLOBIN: 11.8 g/dL (ref 11.1–15.9)
IMMATURE GRANS (ABS): 0 10*3/uL (ref 0.0–0.1)
IMMATURE GRANULOCYTES: 0 %
LYMPHS: 30 %
Lymphocytes Absolute: 1.4 10*3/uL (ref 0.7–3.1)
MCH: 25.8 pg — ABNORMAL LOW (ref 26.6–33.0)
MCHC: 32.6 g/dL (ref 31.5–35.7)
MCV: 79 fL (ref 79–97)
MONOCYTES: 7 %
Monocytes Absolute: 0.3 10*3/uL (ref 0.1–0.9)
NEUTROS PCT: 61 %
Neutrophils Absolute: 2.7 10*3/uL (ref 1.4–7.0)
PLATELETS: 146 10*3/uL — AB (ref 150–379)
RBC: 4.57 x10E6/uL (ref 3.77–5.28)
RDW: 16 % — ABNORMAL HIGH (ref 12.3–15.4)
WBC: 4.5 10*3/uL (ref 3.4–10.8)

## 2015-05-24 LAB — LIPID PANEL WITH LDL/HDL RATIO
CHOLESTEROL TOTAL: 159 mg/dL (ref 100–199)
HDL: 55 mg/dL (ref >39)
LDL Calculated: 91 mg/dL (ref 0–99)
LDl/HDL Ratio: 1.7 ratio units (ref 0.0–3.2)
TRIGLYCERIDES: 66 mg/dL (ref 0–149)
VLDL Cholesterol Cal: 13 mg/dL (ref 5–40)

## 2015-05-24 LAB — TSH: TSH: 1.54 u[IU]/mL (ref 0.450–4.500)

## 2015-06-04 ENCOUNTER — Other Ambulatory Visit: Payer: Self-pay | Admitting: Family Medicine

## 2015-06-04 DIAGNOSIS — J302 Other seasonal allergic rhinitis: Secondary | ICD-10-CM

## 2015-06-05 ENCOUNTER — Telehealth: Payer: Self-pay | Admitting: Family Medicine

## 2015-06-05 DIAGNOSIS — B373 Candidiasis of vulva and vagina: Secondary | ICD-10-CM

## 2015-06-05 DIAGNOSIS — B3731 Acute candidiasis of vulva and vagina: Secondary | ICD-10-CM

## 2015-06-05 MED ORDER — TERCONAZOLE 0.4 % VA CREA: 1.0000 | TOPICAL_CREAM | Freq: Every day | VAGINAL | Status: DC

## 2015-06-05 MED ORDER — FLUCONAZOLE 150 MG PO TABS: 150.0000 mg | ORAL_TABLET | Freq: Once | ORAL | Status: DC

## 2015-06-05 NOTE — Telephone Encounter (Signed)
Ok to send in rx for [employer] and Diflucan. Thanks.

## 2015-06-05 NOTE — Telephone Encounter (Signed)
Pt stated she had a tooth pulled and her dentist put her on Amoxicillin Thursday 05/30/15. Pt stated she now has a yeast infection and would like both pill and cream sent to [facility]. Pt stated that she is having vaginal itching and burning when she urinates. Please advise. Thanks TNP

## 2015-06-05 NOTE — Telephone Encounter (Signed)
RX sent pt advised.   Thanks,   -Sherral Dirocco  

## 2015-06-10 ENCOUNTER — Ambulatory Visit (INDEPENDENT_AMBULATORY_CARE_PROVIDER_SITE_OTHER): Payer: Medicaid Other | Admitting: Podiatry

## 2015-06-10 ENCOUNTER — Encounter: Payer: Self-pay | Admitting: Podiatry

## 2015-06-10 ENCOUNTER — Telehealth: Payer: Self-pay | Admitting: Family Medicine

## 2015-06-10 ENCOUNTER — Telehealth: Payer: Self-pay | Admitting: *Deleted

## 2015-06-10 ENCOUNTER — Ambulatory Visit: Payer: Self-pay

## 2015-06-10 VITALS — BP 119/53 | HR 74 | Resp 16

## 2015-06-10 DIAGNOSIS — S92919S Unspecified fracture of unspecified toe(s), sequela: Secondary | ICD-10-CM

## 2015-06-10 DIAGNOSIS — S92911A Unspecified fracture of right toe(s), initial encounter for closed fracture: Secondary | ICD-10-CM

## 2015-06-10 DIAGNOSIS — S92919A Unspecified fracture of unspecified toe(s), initial encounter for closed fracture: Secondary | ICD-10-CM

## 2015-06-10 HISTORY — DX: Unspecified fracture of unspecified toe(s), initial encounter for closed fracture: S92.919A

## 2015-06-10 MED ORDER — MEPERIDINE HCL 50 MG PO TABS: 50.0000 mg | ORAL_TABLET | ORAL | Status: DC | PRN

## 2015-06-10 NOTE — Telephone Encounter (Signed)
Pt is requesting to see a specialist for toe fx.It is her big toe on right foot.Per pt she was seen at [facility] and had the disc.She states she is in a lot of pain

## 2015-06-10 NOTE — Progress Notes (Signed)
   Subjective:    Patient ID: Erin Richards, female    DOB: 1965/05/27, 50 y.o.   MRN: [id number]  HPI: She presents today for an injury that occurred yesterday to the hallux of the right foot. She states that a car lift came down on her toe and broke it. She went to the emergency room a x-ray was performed demonstrating a fracture.    Review of Systems  Constitutional: Positive for activity change.  Musculoskeletal: Positive for gait problem.  Skin: Positive for color change.  All other systems reviewed and are negative.      Objective:   Physical Exam: Vital signs are stable alert and oriented 3. Pulses are palpable. Ecchymosis and edema to the right foot. Neurologic sensorium is still intact. Deep tendon reflexes are intact muscle strength is normal. Orthopedic evaluation was resolved with distal ankle full range of motion without crepitation. She does have pain on palpation proximal phalanx hallux right. Radiographs that were brought with her today were reviewed demonstrating a transverse fracture distal most aspect of the neck of the proximal phalanx. Minimal dislocation that should go on to heal and should not need to be pinned.        Assessment & Plan:  Fracture hallux right.  Plan: Place her in a Cam Walker and will follow up with her in 4 weeks for another set of x-rays. I did explain to her that it is possible that he will need to be pinned in place next visit if she does not continue to ambulate with this boot.

## 2015-06-10 NOTE — Telephone Encounter (Signed)
She is allergic to all other narcotics.  There is nothing left

## 2015-06-10 NOTE — Telephone Encounter (Signed)
Medicaid will not cover Meperidine 50 mg.  Can you prescribe something else?

## 2015-06-11 NOTE — Telephone Encounter (Signed)
Faxed completed Fyffe Alda of Prior Approval [employer] and copy of pt's 51 allergies.

## 2015-06-13 NOTE — Telephone Encounter (Signed)
I attempted to call  [employer] to check on status of request. I was informed that it had been denied.  She informed me that I could resubmit it and inform if there was 2 other medications tried.  I re-submitted the request.

## 2015-06-19 ENCOUNTER — Encounter: Payer: Medicaid Other | Admitting: Obstetrics and Gynecology

## 2015-07-03 ENCOUNTER — Ambulatory Visit (INDEPENDENT_AMBULATORY_CARE_PROVIDER_SITE_OTHER): Payer: Medicaid Other | Admitting: Podiatry

## 2015-07-03 ENCOUNTER — Ambulatory Visit (INDEPENDENT_AMBULATORY_CARE_PROVIDER_SITE_OTHER): Payer: Medicaid Other

## 2015-07-03 ENCOUNTER — Encounter: Payer: Self-pay | Admitting: Podiatry

## 2015-07-03 DIAGNOSIS — S92911A Unspecified fracture of right toe(s), initial encounter for closed fracture: Secondary | ICD-10-CM | POA: Diagnosis not present

## 2015-07-03 DIAGNOSIS — S92911B Unspecified fracture of right toe(s), initial encounter for open fracture: Secondary | ICD-10-CM | POA: Diagnosis not present

## 2015-07-03 DIAGNOSIS — S92911D Unspecified fracture of right toe(s), subsequent encounter for fracture with routine healing: Secondary | ICD-10-CM

## 2015-07-03 NOTE — Progress Notes (Signed)
She presents today for follow-up of her painful hallux right foot. She states that she is a unable to wear the Cam Walker for her fractured hallux because it rubs her leg. She states that the toe really doesn't hurt that much anymore and swelling has gone down.  Objective: Vital signs are stable she is alert and oriented 3 there is no erythema edema saline straight drainage or odor. She has good range of motion of the first metatarsophalangeal joint. Radiographs confirm healing fracture proximal phalanx.  Assessment: Healing fracture hallux right.  Plan: Placed her in a Darco shoe and will follow-up with her as needed or in 1 month.

## 2015-07-10 ENCOUNTER — Emergency Department
Admission: EM | Admit: 2015-07-10 | Discharge: 2015-07-10 | Disposition: A | Discharge status: Home / Self Care | Payer: Medicaid Other | Attending: Emergency Medicine | Admitting: Emergency Medicine

## 2015-07-10 DIAGNOSIS — F329 Major depressive disorder, single episode, unspecified: Secondary | ICD-10-CM | POA: Insufficient documentation

## 2015-07-10 DIAGNOSIS — Z79899 Other long term (current) drug therapy: Secondary | ICD-10-CM | POA: Insufficient documentation

## 2015-07-10 DIAGNOSIS — R112 Nausea with vomiting, unspecified: Secondary | ICD-10-CM | POA: Insufficient documentation

## 2015-07-10 DIAGNOSIS — R1030 Lower abdominal pain, unspecified: Secondary | ICD-10-CM | POA: Diagnosis not present

## 2015-07-10 DIAGNOSIS — Z90710 Acquired absence of both cervix and uterus: Secondary | ICD-10-CM | POA: Diagnosis not present

## 2015-07-10 LAB — URINALYSIS COMPLETE WITH MICROSCOPIC (ARMC ONLY)
BILIRUBIN URINE: NEGATIVE
Bacteria, UA: NONE SEEN
GLUCOSE, UA: NEGATIVE mg/dL
KETONES UR: NEGATIVE mg/dL
Leukocytes, UA: NEGATIVE
NITRITE: NEGATIVE
Protein, ur: NEGATIVE mg/dL
SPECIFIC GRAVITY, URINE: 1.003 — AB (ref 1.005–1.030)
WBC, UA: NONE SEEN WBC/hpf (ref 0–5)
pH: 6 (ref 5.0–8.0)

## 2015-07-10 LAB — COMPREHENSIVE METABOLIC PANEL
ALK PHOS: 78 U/L (ref 38–126)
ALT: 11 U/L — AB (ref 14–54)
AST: 21 U/L (ref 15–41)
Albumin: 4.2 g/dL (ref 3.5–5.0)
Anion gap: 7 (ref 5–15)
BUN: 8 mg/dL (ref 6–20)
CALCIUM: 9 mg/dL (ref 8.9–10.3)
CHLORIDE: 108 mmol/L (ref 101–111)
CO2: 26 mmol/L (ref 22–32)
CREATININE: 0.77 mg/dL (ref 0.44–1.00)
Glucose, Bld: 118 mg/dL — ABNORMAL HIGH (ref 65–99)
Potassium: 3.5 mmol/L (ref 3.5–5.1)
Sodium: 141 mmol/L (ref 135–145)
Total Bilirubin: 0.7 mg/dL (ref 0.3–1.2)
Total Protein: 7 g/dL (ref 6.5–8.1)

## 2015-07-10 LAB — CBC WITH DIFFERENTIAL/PLATELET
BASOS PCT: 0 %
Basophils Absolute: 0 10*3/uL (ref 0–0.1)
EOS ABS: 0.1 10*3/uL (ref 0–0.7)
EOS PCT: 1 %
HCT: 37.1 % (ref 35.0–47.0)
HEMOGLOBIN: 12.2 g/dL (ref 12.0–16.0)
LYMPHS ABS: 0.8 10*3/uL — AB (ref 1.0–3.6)
Lymphocytes Relative: 10 %
MCH: 26.2 pg (ref 26.0–34.0)
MCHC: 33 g/dL (ref 32.0–36.0)
MCV: 79.5 fL — ABNORMAL LOW (ref 80.0–100.0)
MONOS PCT: 6 %
Monocytes Absolute: 0.4 10*3/uL (ref 0.2–0.9)
NEUTROS PCT: 83 %
Neutro Abs: 6.1 10*3/uL (ref 1.4–6.5)
PLATELETS: 151 10*3/uL (ref 150–440)
RBC: 4.67 MIL/uL (ref 3.80–5.20)
RDW: 15.3 % — ABNORMAL HIGH (ref 11.5–14.5)
WBC: 7.3 10*3/uL (ref 3.6–11.0)

## 2015-07-10 LAB — TROPONIN I: Troponin I: <0.03 ng/mL (ref <0.031)

## 2015-07-10 MED ORDER — ONDANSETRON HCL 4 MG PO TABS: 4.0000 mg | ORAL_TABLET | Freq: Three times a day (TID) | ORAL | Status: DC | PRN

## 2015-07-10 MED ORDER — ONDANSETRON HCL 4 MG/2ML IJ SOLN
4.0000 mg | Freq: Once | INTRAMUSCULAR | Status: AC
  Administered 2015-07-10: 4 mg via INTRAVENOUS
  Filled 2015-07-10: qty 2

## 2015-07-10 MED ORDER — SODIUM CHLORIDE 0.9 % IV BOLUS (SEPSIS)
1000.0000 mL | Freq: Once | INTRAVENOUS | Status: AC
  Administered 2015-07-10: 1000 mL via INTRAVENOUS

## 2015-07-10 MED ORDER — ACETAMINOPHEN 500 MG PO TABS
1000.0000 mg | ORAL_TABLET | Freq: Once | ORAL | Status: AC
Start: 2015-07-10 — End: 2015-07-10
  Administered 2015-07-10: 1000 mg via ORAL
  Filled 2015-07-10: qty 2

## 2015-07-10 NOTE — Consult Note (Signed)
Reason for Consult: Recurrent bowel movements Referring Physician:    CANEISHA Richards is an 50 y.o. female.  HPI: This 50 year old female presented with complaint of significant nausea and vomiting crampy pains in the abdomen and several loose bowel movements of soft stool. This all started sometime after she had a banding of internal hemorrhoids proximally 10 AM this morning by Dr. Rochel Richards. She reports some pain in the rectal area in the suprapubic region of where her primary complaint is nausea vomiting and her crampy pains in the abdomen from time to time. Upon review of her inflammation it is noted that she has had a long history of similar problem multiple ER visits and admissions to this hospital. As listed diagnoses of irritable bowel and also in September last year an episode when she was noted to have C. difficile colitis after antibiotic use for dental procedure..Time she was treated with Dificid. Patient also has a multitude of other documented health issues and has allergies to multiple medications including pain medicines and can only tolerate Tylenol  Past Medical History  Diagnosis Date  . IBS (irritable bowel syndrome)   . 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 Allergy   . Depression     Past Surgical History  Procedure Laterality Date  . Cardiac surgery    . Abdominal hysterectomy    . Colonoscopy with propofol N/A 08/16/2014    Procedure: COLONOSCOPY WITH PROPOFOL;  Surgeon: Manya Silvas, MD;  Location[facility] ENDOSCOPY;  Service: Endoscopy;  Laterality: N/A;  . Esophagogastroduodenoscopy (egd) with propofol  08/16/2014    Procedure: ESOPHAGOGASTRODUODENOSCOPY (EGD) WITH PROPOFOL;  Surgeon: Manya Silvas, MD;  Location: [facility] ENDOSCOPY;  Service: Endoscopy;;    Family History  Problem Relation Age of Onset  . Heart disease Father   . Cancer Father   . Cancer Sister     BREAST    Social History:  reports that she has never smoked. She has never  used smokeless tobacco. She reports that she does not drink alcohol or use illicit drugs.  Allergies:  Allergies  Allergen Reactions  . Acetaminophen-Codeine Nausea And Vomiting  . Aspartame Other (See Comments)    Reaction: unknown  . Aspartame And Phenylalanine Nausea And Vomiting  . Antiseptic Oral Rinse  [Cetylpyridinium Chloride] Other (See Comments)    UNK  . Biaxin [Clarithromycin] Nausea And Vomiting  . Carafate [Sucralfate]     Pt says her "Stomach hurts" when she takes it.    . Chlorhexidine Other (See Comments)  . Chlorhexidine Gluconate Nausea And Vomiting  . Clindamycin Diarrhea and Nausea And Vomiting  . Clindamycin/Lincomycin Nausea And Vomiting  . Codeine Itching and Nausea And Vomiting  . Dextromethorphan Hbr Other (See Comments)    Reaction: unknown  . Dilaudid [Hydromorphone Hcl] Nausea And Vomiting  . Doxycycline Nausea And Vomiting  . Fentanyl Nausea And Vomiting  . Germanium Other (See Comments)  . Hydrocodone Nausea And Vomiting  . Ketorolac Other (See Comments)  . Levofloxacin Other (See Comments)    GI upset. GI upset  . Mefenamic Acid Nausea And Vomiting  . Metformin And Related Nausea And Vomiting  . Metronidazole Diarrhea and Nausea And Vomiting  . Morphine And Related Nausea And Vomiting  . Morphine Sulfate Itching and Nausea And Vomiting  . Moxifloxacin Swelling  . Moxifloxacin Hcl In Nacl Other (See Comments)    Reaction: unknown  . Nitrofuran Derivatives Other (See Comments)    Reaction: unknown  . Nitrofurantoin  Nausea And Vomiting and Other (See Comments)  . Nitrofurantoin Monohyd Macro Other (See Comments)    Reaction: unknown  . Nsaids Other (See Comments)    Reaction: unknown  . Other Other (See Comments)  . Oxycodone-Acetaminophen Nausea And Vomiting  . Periguard [Dimethicone] Nausea And Vomiting  . Phenothiazines Nausea And Vomiting  . Pioglitazone Nausea And Vomiting  . Quinidine Nausea And Vomiting  . Quinine Derivatives  Nausea And Vomiting  . Quinolones Nausea And Vomiting  . Rumex Crispus Other (See Comments)  . Tetracycline Nausea And Vomiting  . Tetracyclines & Related Nausea And Vomiting  . Toradol [Ketorolac Tromethamine] Nausea And Vomiting  . Tramadol Nausea And Vomiting  . Tussin [Guaifenesin] Nausea And Vomiting  . Tussionex Pennkinetic Er [Hydrocod Polst-Cpm Polst Er] Nausea And Vomiting  . Buprenorphine Hcl Nausea And Vomiting  . Hydrocodone-Chlorpheniramine Nausea And Vomiting  . Hydromorphone Nausea And Vomiting and Other (See Comments)    UNK  . Lincomycin Hcl Nausea And Vomiting  . Phenylalanine Nausea And Vomiting    Medications: I have reviewed the patient's current medications.  No results found for this or any previous visit (from the past 48 hour(s)).  No results found.  Review of Systems  Constitutional: Negative.   Respiratory: Negative.   Cardiovascular: Negative.   Gastrointestinal: Positive for nausea, vomiting, abdominal pain and diarrhea. Negative for blood in stool.   Blood pressure 124/45, pulse 74, temperature 98.5 F (36.9 C), temperature source Oral, resp. rate 18, height 4\' 9"  (1.448 m), weight 140 lb (63.504 kg), SpO2 95 %. Physical Exam  Constitutional: She is oriented to person, place, and time. She appears well-developed and well-nourished.  Pt lying on her left side, holding her abdomen.  Eyes: Conjunctivae are normal. No scleral icterus.  Neck: Neck supple. No thyromegaly present.  Cardiovascular: Regular rhythm and normal heart sounds.   Respiratory: Effort normal and breath sounds normal.  GI: Soft. Bowel sounds are normal. She exhibits no distension and no mass. There is no tenderness.  Neurological: She is alert and oriented to person, place, and time.  Skin: Skin is warm and dry.  Rectal exam shows no evidence of any complications associated with the banding which appears to be intact within the anal canal. No external edema or inflammation noted  and no bleeding.  Assessment/Plan: Hemorrhoid banding appears to have been successful and is not associated with any of the symptoms patient is complaining off. Recommended patient received some IV fluids and symptomatic medicine for her nausea and vomiting. Routine labs to ensure there is no major concerns. No surgical issues noted at this time.Pt advised.  Case discussed with Dr. Reita Cliche  - ER physician- and RN  Christene Lye 07/10/2015, 8:41 PM
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
## 2015-07-10 NOTE — ED Notes (Signed)
Pt arrives to ER via POV c/o nonstop vomiting and abdominal pain since hemorrhoid banding that was done today. Pt alert and oriented X4, active, cooperative, pt in NAD. RR even and unlabored, color WNL.

## 2015-07-10 NOTE — ED Notes (Signed)
Pt states she had 3 hemorrhoids banded this AM and following the procedure she has had lower abdominal pain 10/10, N/V, accompanied by loose stools.

## 2015-07-10 NOTE — Discharge Instructions (Signed)
You were evaluated for lower abdominal pain, as well as vomiting, and although no certain cause was found, we discussed your exam and evaluation are reassuring in the emergency department tonight.  Return to the emergency department for any worsening condition including worsening pain, vomiting blood, fever, dizziness or passing out, or any other symptoms concerning to you.   Abdominal Pain, Adult Many things can cause belly (abdominal) pain. Most times, the belly pain is not dangerous. Many cases of belly pain can be watched and treated at home. HOME CARE   Do not take medicines that help you go poop (laxatives) unless told to by your doctor.  Only take medicine as told by your doctor.  Eat or drink as told by your doctor. Your doctor will tell you if you should be on a special diet. GET HELP IF:  You do not know what is causing your belly pain.  You have belly pain while you are sick to your stomach (nauseous) or have runny poop (diarrhea).  You have pain while you pee or poop.  Your belly pain wakes you up at night.  You have belly pain that gets worse or better when you eat.  You have belly pain that gets worse when you eat fatty foods.  You have a fever. GET HELP RIGHT AWAY IF:   The pain does not go away within 2 hours.  You keep throwing up (vomiting).  The pain changes and is only in the right or left part of the belly.  You have bloody or tarry looking poop. MAKE SURE YOU:   Understand these instructions.  Will watch your condition.  Will get help right away if you are not doing well or get worse.   This information is not intended to replace advice given to you by your health care provider. Make sure you discuss any questions you have with your health care provider.   Document Released: 07/08/2007 Document Revised: 02/09/2014 Document Reviewed: 09/28/2012 Elsevier Interactive Patient Education [employer].

## 2015-07-12 ENCOUNTER — Encounter: Payer: Self-pay | Admitting: Family Medicine

## 2015-07-17 ENCOUNTER — Other Ambulatory Visit: Payer: Self-pay | Admitting: Obstetrics and Gynecology

## 2015-07-17 ENCOUNTER — Encounter: Payer: Self-pay | Admitting: Obstetrics and Gynecology

## 2015-07-17 ENCOUNTER — Ambulatory Visit (INDEPENDENT_AMBULATORY_CARE_PROVIDER_SITE_OTHER): Payer: Medicaid Other | Admitting: Obstetrics and Gynecology

## 2015-07-17 VITALS — BP 99/62 | HR 74 | Ht <= 58 in | Wt 141.8 lb

## 2015-07-17 DIAGNOSIS — N898 Other specified noninflammatory disorders of vagina: Secondary | ICD-10-CM

## 2015-07-17 NOTE — Progress Notes (Signed)
GYNECOLOGY CLINIC VISIT  Subjective:     Erin Richards is a 50 y.o. [phone]  female who presents for evaluation of an abnormal vaginal discharge. Was referred from [facility] Margarita Rana, MD). Symptoms have been present for 2 years. Vaginal symptoms: discharge described as white and thin with slight odor occasionally. Contraception: none. She denies abnormal bleeding, bumps, burning, local irritation and pain Sexually transmitted infection risk: very low risk of STD exposure.  The patient is not sexually active.  Has been screened several times by PCP for vaginitis and STDs with negative results.    Past Medical History  Diagnosis Date  . IBS (irritable bowel syndrome)   . 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 Allergy   . Depression     Family History  Problem Relation Age of Onset  . Heart disease Father   . Cancer Father   . Cancer Sister     BREAST    Past Surgical History  Procedure Laterality Date  . Cardiac surgery    . Abdominal hysterectomy      supracervical  . Colonoscopy with propofol N/A 08/16/2014    Procedure: COLONOSCOPY WITH PROPOFOL;  Surgeon: Manya Silvas, MD;  Location: [facility] ENDOSCOPY;  Service: Endoscopy;  Laterality: N/A;  . Esophagogastroduodenoscopy (egd) with propofol  08/16/2014    Procedure: ESOPHAGOGASTRODUODENOSCOPY (EGD) WITH PROPOFOL;  Surgeon: Manya Silvas, MD;  Location: [facility] ENDOSCOPY;  Service: Endoscopy;;    Social History   Social History  . Marital Status: Divorced    Spouse Name: N/A  . Number of Children: 2  . Years of Education: N/A   Occupational History  . home maker    Social History Main Topics  . Smoking status: Never Smoker   . Smokeless tobacco: Never Used  . Alcohol Use: No  . Drug Use: No  . Sexual Activity: Not Currently    Birth Control/ Protection: Surgical   Other Topics Concern  . Not on file   Social History Narrative    Allergies  Allergen Reactions    . Acetaminophen-Codeine Nausea And Vomiting  . Aspartame Other (See Comments)    Reaction: unknown  . Aspartame And Phenylalanine Nausea And Vomiting  . Antiseptic Oral Rinse  [Cetylpyridinium Chloride] Other (See Comments)    UNK  . Biaxin [Clarithromycin] Nausea And Vomiting  . Carafate [Sucralfate]     Pt says her "Stomach hurts" when she takes it.    . Chlorhexidine Other (See Comments)  . Chlorhexidine Gluconate Nausea And Vomiting  . Clindamycin Diarrhea and Nausea And Vomiting  . Clindamycin/Lincomycin Nausea And Vomiting  . Codeine Itching and Nausea And Vomiting  . Dextromethorphan Hbr Other (See Comments)    Reaction: unknown  . Dilaudid [Hydromorphone Hcl] Nausea And Vomiting  . Doxycycline Nausea And Vomiting  . Fentanyl Nausea And Vomiting  . Germanium Other (See Comments)  . Hydrocodone Nausea And Vomiting  . Ketorolac Other (See Comments)  . Levofloxacin Other (See Comments)    GI upset. GI upset  . Mefenamic Acid Nausea And Vomiting  . Metformin And Related Nausea And Vomiting  . Metronidazole Diarrhea and Nausea And Vomiting  . Morphine And Related Nausea And Vomiting  . Morphine Sulfate Itching and Nausea And Vomiting  . Moxifloxacin Swelling  . Moxifloxacin Hcl In Nacl Other (See Comments)    Reaction: unknown  . Nitrofuran Derivatives Other (See Comments)    Reaction: unknown  . Nitrofurantoin Nausea And Vomiting and  Other (See Comments)  . Nitrofurantoin Monohyd Macro Other (See Comments)    Reaction: unknown  . Nsaids Other (See Comments)    Reaction: unknown  . Other Other (See Comments)  . Oxycodone-Acetaminophen Nausea And Vomiting  . Periguard [Dimethicone] Nausea And Vomiting  . Phenothiazines Nausea And Vomiting  . Pioglitazone Nausea And Vomiting  . Quinidine Nausea And Vomiting  . Quinine Derivatives Nausea And Vomiting  . Quinolones Nausea And Vomiting  . Rumex Crispus Other (See Comments)  . Tetracycline Nausea And Vomiting  .  Tetracyclines & Related Nausea And Vomiting  . Toradol [Ketorolac Tromethamine] Nausea And Vomiting  . Tramadol Nausea And Vomiting  . Tussin [Guaifenesin] Nausea And Vomiting  . Tussionex Pennkinetic Er [Hydrocod Polst-Cpm Polst Er] Nausea And Vomiting  . Buprenorphine Hcl Nausea And Vomiting  . Hydrocodone-Chlorpheniramine Nausea And Vomiting  . Hydromorphone Nausea And Vomiting and Other (See Comments)    UNK  . Lincomycin Hcl Nausea And Vomiting  . Oxycodone-Acetaminophen Hives and Nausea And Vomiting    Other reaction(s): Nausea And Vomiting, Unknown  . Phenylalanine Nausea And Vomiting    Current Outpatient Prescriptions on File Prior to Visit  Medication Sig Dispense Refill  . albuterol (PROVENTIL HFA;VENTOLIN HFA) 108 (90 BASE) MCG/ACT inhaler Inhale 1-2 puffs into the lungs every 6 (six) hours as needed for shortness of breath. 1 Inhaler 3  . cetirizine (ZYRTEC) 10 MG tablet TAKE 1 TABLET BY MOUTH EVERY DAY 30 tablet 11  . citalopram (CELEXA) 20 MG tablet Take 10 mg by mouth daily.    Marland Kitchen dicyclomine (BENTYL) 20 MG tablet Take 20 mg by mouth every 4 (four) hours as needed for spasms.    Marland Kitchen estradiol (ESTRACE) 0.5 MG tablet Take 0.5 mg by mouth daily.    . fluconazole (DIFLUCAN) 150 MG tablet Take 1 tablet (150 mg total) by mouth once. And then repeat in one week. 2 tablet 0  . fluticasone (FLONASE) 50 MCG/ACT nasal spray Place 2 sprays into both nostrils daily as needed for allergies or rhinitis. Reported on 04/03/2015 16 g 5  . gabapentin (NEURONTIN) 300 MG capsule Take 300 mg by mouth daily.     . Ipratropium-Albuterol (COMBIVENT RESPIMAT) 20-100 MCG/ACT AERS respimat Inhale 1 puff into the lungs every 4 (four) hours as needed for wheezing. Reported on 04/03/2015    . loperamide (IMODIUM A-D) 2 MG tablet Take 1 tablet (2 mg total) by mouth 4 (four) times daily as needed for diarrhea or loose stools. 30 tablet 0  . meloxicam (MOBIC) 7.5 MG tablet Take 1 tablet (7.5 mg total) by mouth  daily. 30 tablet 2  . meperidine (DEMEROL) 50 MG tablet Take 1 tablet (50 mg total) by mouth every 4 (four) hours as needed for severe pain. 30 tablet 0  . montelukast (SINGULAIR) 10 MG tablet Take 1 tablet (10 mg total) by mouth at bedtime. Reported on 04/03/2015 30 tablet 5  . nortriptyline (PAMELOR) 50 MG capsule Take 1 capsule by mouth daily as needed (headache).     . ondansetron (ZOFRAN) 4 MG tablet Take 1 tablet (4 mg total) by mouth every 8 (eight) hours as needed for nausea or vomiting. 10 tablet 0  . pantoprazole (PROTONIX) 40 MG tablet Take 40 mg by mouth daily.    Marland Kitchen terconazole (TERAZOL 7) 0.4 % vaginal cream Place 1 applicator vaginally at bedtime. 45 g 0   No current facility-administered medications on file prior to visit.     Review of Systems Pertinent items  noted in HPI and remainder of comprehensive ROS otherwise negative.    Objective:    BP 99/62 mmHg  Pulse 74  Ht 4\' 9"  (1.448 m)  Wt 141 lb 12.8 oz (64.32 kg)  BMI 30.68 kg/m2 General appearance: alert and no distress Abdomen: soft, non-tender; bowel sounds normal; no masses,  no organomegaly Pelvic: external genitalia normal, rectovaginal septum normal.  Vagina with scant thin wite discharge.  Cervix normal appearing, no lesions and no motion tenderness.  Uterus mobile, nontender, normal shape and size.  Adnexae non-palpable, nontender bilaterally.  Extremities: extremities normal, atraumatic, no cyanosis or edema Skin: Skin color, texture, turgor normal. No rashes or lesions Neurologic: Grossly normal     Microscopic wet-mount exam shows negative for pathogens, normal epithelial cells.  Assessment:    Normal physiologic discharge.    Plan:   - Symptomatic local care discussed.  Reassurance given that discharge was likely normal and physiologic. Wet prep negative.  - Review of patient's medications note that she does use HRT (currently on estrogen).  Discussed that this could lead to an increase in normal  physiologic discharge.   - Will perform extended panel Nuswab to r/o atypical causes for vaginal discharge.  - Advised to eat plain, probiotic yogurt daily or take a daily vaginal probiotic (can be found in the feminine aisle at any pharmacy) containing Acidophillus (they should contain at least 1 billion CFU's- this is information found on the bottle).  - Avoid harsh soaps, and detergents.  Can use vaginal washes like Vagisil or Summer's Eve.  - Avoid routine douching, but can use vaginal moisturizers like Rephresh/Replens/Luvena or use Vagisil for mild intermittent feminine itching if needed.  - To follow up as needed.  Will notify patient of results by phone in 1 week.    Rubie Maid, MD [facility]

## 2015-07-20 LAB — CANDIDA 6 SPECIES PROFILE, NAA
CANDIDA ALBICANS, NAA: NEGATIVE
CANDIDA GLABRATA, NAA: NEGATIVE
CANDIDA TROPICALIS, NAA: NEGATIVE
Candida krusei, NAA: NEGATIVE
Candida lusitaniae, NAA: NEGATIVE
Candida parapsilosis, NAA: NEGATIVE

## 2015-07-20 LAB — M GENITALIUM NAA, SWAB: Mycoplasma genitalium NAA: NEGATIVE

## 2015-07-23 ENCOUNTER — Encounter: Payer: Self-pay | Admitting: Family Medicine

## 2015-07-23 ENCOUNTER — Ambulatory Visit (INDEPENDENT_AMBULATORY_CARE_PROVIDER_SITE_OTHER): Payer: Medicaid Other | Admitting: Family Medicine

## 2015-07-23 VITALS — BP 98/56 | HR 84 | Temp 97.6°F | Resp 16 | Wt 142.0 lb

## 2015-07-23 DIAGNOSIS — W57XXXA Bitten or stung by nonvenomous insect and other nonvenomous arthropods, initial encounter: Secondary | ICD-10-CM | POA: Diagnosis not present

## 2015-07-23 DIAGNOSIS — J302 Other seasonal allergic rhinitis: Secondary | ICD-10-CM | POA: Diagnosis not present

## 2015-07-23 DIAGNOSIS — F321 Major depressive disorder, single episode, moderate: Secondary | ICD-10-CM | POA: Diagnosis not present

## 2015-07-23 DIAGNOSIS — R21 Rash and other nonspecific skin eruption: Secondary | ICD-10-CM | POA: Diagnosis not present

## 2015-07-23 DIAGNOSIS — G43809 Other migraine, not intractable, without status migrainosus: Secondary | ICD-10-CM

## 2015-07-23 MED ORDER — CITALOPRAM HYDROBROMIDE 20 MG PO TABS: 10.0000 mg | ORAL_TABLET | Freq: Every day | ORAL | Status: DC

## 2015-07-23 MED ORDER — MONTELUKAST SODIUM 10 MG PO TABS: 10.0000 mg | ORAL_TABLET | Freq: Every day | ORAL | Status: DC

## 2015-07-23 MED ORDER — CHLORAMPHENICOL POWD: Status: DC

## 2015-07-23 MED ORDER — FLUTICASONE PROPIONATE 50 MCG/ACT NA SUSP: 2.0000 | Freq: Every day | NASAL | Status: DC | PRN

## 2015-07-23 MED ORDER — ESTRADIOL 0.5 MG PO TABS: 0.5000 mg | ORAL_TABLET | Freq: Every day | ORAL | Status: DC

## 2015-07-23 MED ORDER — NORTRIPTYLINE HCL 50 MG PO CAPS: 50.0000 mg | ORAL_CAPSULE | Freq: Every day | ORAL | Status: DC | PRN

## 2015-07-23 MED ORDER — GABAPENTIN 300 MG PO CAPS: 300.0000 mg | ORAL_CAPSULE | Freq: Every day | ORAL | Status: DC

## 2015-07-23 NOTE — Progress Notes (Signed)
Patient: Erin Richards Female    DOB: 10/13/1965   50 y.o.   MRN: [id number] Visit Date: 07/23/2015  Today's Provider: Margarita Rana, MD   Chief Complaint  Patient presents with  . Tick Removal   Subjective:    HPI   Pt comes in today to be evaluated for a tick bite.  She reports pulling a tick off her right hip Sunday 07/21/2015.  She complaints of a small rash, itch and mildly tender at the sight of the tick bite.  She denies Fevers, chills.  She does however have a "runny nose" which started last night.  She is concern that this may be the start of [place] spotted fever which she has had twice already.   Also stable on her other medications and would like a refills today. Has no concerns about her other medications.      Allergies  Allergen Reactions  . Acetaminophen-Codeine Nausea And Vomiting  . Aspartame Other (See Comments)    Reaction: unknown  . Aspartame And Phenylalanine Nausea And Vomiting  . Antiseptic Oral Rinse  [Cetylpyridinium Chloride] Other (See Comments)    UNK  . Biaxin [Clarithromycin] Nausea And Vomiting  . Carafate [Sucralfate]     Pt says her "Stomach hurts" when she takes it.    . Chlorhexidine Other (See Comments)  . Chlorhexidine Gluconate Nausea And Vomiting  . Clindamycin Diarrhea and Nausea And Vomiting  . Clindamycin/Lincomycin Nausea And Vomiting  . Codeine Itching and Nausea And Vomiting  . Dextromethorphan Hbr Other (See Comments)    Reaction: unknown  . Dilaudid [Hydromorphone Hcl] Nausea And Vomiting  . Doxycycline Nausea And Vomiting  . Fentanyl Nausea And Vomiting  . Germanium Other (See Comments)  . Hydrocodone Nausea And Vomiting  . Ketorolac Other (See Comments)  . Levofloxacin Other (See Comments)    GI upset. GI upset  . Mefenamic Acid Nausea And Vomiting  . Metformin And Related Nausea And Vomiting  . Metronidazole Diarrhea and Nausea And Vomiting  . Morphine And Related Nausea And Vomiting  .  Morphine Sulfate Itching and Nausea And Vomiting  . Moxifloxacin Swelling  . Moxifloxacin Hcl In Nacl Other (See Comments)    Reaction: unknown  . Nitrofuran Derivatives Other (See Comments)    Reaction: unknown  . Nitrofurantoin Nausea And Vomiting and Other (See Comments)  . Nitrofurantoin Monohyd Macro Other (See Comments)    Reaction: unknown  . Nsaids Other (See Comments)    Reaction: unknown  . Other Other (See Comments)  . Oxycodone-Acetaminophen Nausea And Vomiting  . Periguard [Dimethicone] Nausea And Vomiting  . Phenothiazines Nausea And Vomiting  . Pioglitazone Nausea And Vomiting  . Quinidine Nausea And Vomiting  . Quinine Derivatives Nausea And Vomiting  . Quinolones Nausea And Vomiting  . Rumex Crispus Other (See Comments)  . Tetracycline Nausea And Vomiting  . Tetracyclines & Related Nausea And Vomiting  . Toradol [Ketorolac Tromethamine] Nausea And Vomiting  . Tramadol Nausea And Vomiting  . Tussin [Guaifenesin] Nausea And Vomiting  . Tussionex Pennkinetic Er [Hydrocod Polst-Cpm Polst Er] Nausea And Vomiting  . Buprenorphine Hcl Nausea And Vomiting  . Hydrocodone-Chlorpheniramine Nausea And Vomiting  . Hydromorphone Nausea And Vomiting and Other (See Comments)    UNK  . Lincomycin Hcl Nausea And Vomiting  . Oxycodone-Acetaminophen Hives and Nausea And Vomiting    Other reaction(s): Nausea And Vomiting, Unknown  . Phenylalanine Nausea And Vomiting   Current Meds  Medication  Sig  . albuterol (PROVENTIL HFA;VENTOLIN HFA) 108 (90 BASE) MCG/ACT inhaler Inhale 1-2 puffs into the lungs every 6 (six) hours as needed for shortness of breath.  . cetirizine (ZYRTEC) 10 MG tablet TAKE 1 TABLET BY MOUTH EVERY DAY  . citalopram (CELEXA) 20 MG tablet Take 10 mg by mouth daily.  Marland Kitchen dicyclomine (BENTYL) 20 MG tablet Take 20 mg by mouth every 4 (four) hours as needed for spasms.  Marland Kitchen estradiol (ESTRACE) 0.5 MG tablet Take 0.5 mg by mouth daily.  . fluconazole (DIFLUCAN) 150 MG  tablet Take 1 tablet (150 mg total) by mouth once. And then repeat in one week.  . fluticasone (FLONASE) 50 MCG/ACT nasal spray Place 2 sprays into both nostrils daily as needed for allergies or rhinitis. Reported on 04/03/2015  . gabapentin (NEURONTIN) 300 MG capsule Take 300 mg by mouth daily.   . Ipratropium-Albuterol (COMBIVENT RESPIMAT) 20-100 MCG/ACT AERS respimat Inhale 1 puff into the lungs every 4 (four) hours as needed for wheezing. Reported on 04/03/2015  . loperamide (IMODIUM A-D) 2 MG tablet Take 1 tablet (2 mg total) by mouth 4 (four) times daily as needed for diarrhea or loose stools.  . montelukast (SINGULAIR) 10 MG tablet Take 1 tablet (10 mg total) by mouth at bedtime. Reported on 04/03/2015  . nortriptyline (PAMELOR) 50 MG capsule Take 1 capsule by mouth daily as needed (headache).   . pantoprazole (PROTONIX) 40 MG tablet Take 40 mg by mouth daily.    Review of Systems  Constitutional: Negative.   HENT: Positive for rhinorrhea, sinus pressure (Right sided started yesterday.) and voice change. Negative for congestion, drooling, ear discharge, ear pain, facial swelling, hearing loss, mouth sores, nosebleeds, postnasal drip, sneezing, sore throat, tinnitus and trouble swallowing.   Eyes: Positive for discharge and itching. Negative for photophobia, pain, redness and visual disturbance.  Respiratory: Negative.   Neurological: Negative for dizziness, light-headedness and headaches.    Social History  Substance Use Topics  . Smoking status: Never Smoker   . Smokeless tobacco: Never Used  . Alcohol Use: No   Objective:   BP 98/56 mmHg  Pulse 84  Temp(Src) 97.6 F (36.4 C) (Oral)  Resp 16  Wt 142 lb (64.411 kg)  Physical Exam  Constitutional: She is oriented to person, place, and time. She appears well-developed and well-nourished.  Neurological: She is alert and oriented to person, place, and time.  Skin: Skin is warm and dry. Rash (on hip, about 2 cm.  Erythematous.  )  noted.  Psychiatric: She has a normal mood and affect. Her behavior is normal. Judgment and thought content normal.      Assessment & Plan:     1. Tick bite New problem.  Reassurance given.   Will send in antibiotic to start if worsens.  Has history of RMSF.    2. Other seasonal allergic rhinitis Condition is stable. Please continue current medication and  plan of care as noted.   - fluticasone (FLONASE) 50 MCG/ACT nasal spray; Place 2 sprays into both nostrils daily as needed for allergies or rhinitis. Reported on 04/03/2015  Dispense: 16 g; Refill: 5 - montelukast (SINGULAIR) 10 MG tablet; Take 1 tablet (10 mg total) by mouth at bedtime. Reported on 04/03/2015  Dispense: 30 tablet; Refill: 5  3. Major depressive disorder, single episode, moderate (HCC) Condition is stable. Please continue current medication and  plan of care as noted.   - citalopram (CELEXA) 20 MG tablet; Take 0.5 tablets (10 mg total) by  mouth daily.  Dispense: 15 tablet; Refill: 5  4. Other migraine without status migrainosus, not intractable Refilled medication today.   - nortriptyline (PAMELOR) 50 MG capsule; Take 1 capsule (50 mg total) by mouth daily as needed (headache).  Dispense: 30 capsule; Refill: 5      Patient was seen and examined by Jerrell Belfast, MD, and note scribed by Ashley Royalty, CMA.  I have reviewed the document for accuracy and completeness and I agree with above. - Jerrell Belfast, MD   Margarita Rana, MD  [facility]

## 2015-07-24 NOTE — ED Provider Notes (Signed)
ED ECG.  Viewed and interpreted by myself, Dr. Lisa Roca  71bpm.  Normal sinus rhythm.  Nonspecific intraventricular conduction delay.  Nonspecific st, early repol.   Lisa Roca, MD 07/24/15 1524

## 2015-07-31 ENCOUNTER — Ambulatory Visit (INDEPENDENT_AMBULATORY_CARE_PROVIDER_SITE_OTHER): Payer: Medicaid Other

## 2015-07-31 ENCOUNTER — Encounter: Payer: Self-pay | Admitting: Podiatry

## 2015-07-31 ENCOUNTER — Ambulatory Visit (INDEPENDENT_AMBULATORY_CARE_PROVIDER_SITE_OTHER): Payer: Medicaid Other | Admitting: Podiatry

## 2015-07-31 ENCOUNTER — Telehealth: Payer: Self-pay

## 2015-07-31 DIAGNOSIS — S92911B Unspecified fracture of right toe(s), initial encounter for open fracture: Secondary | ICD-10-CM

## 2015-07-31 DIAGNOSIS — S92911D Unspecified fracture of right toe(s), subsequent encounter for fracture with routine healing: Secondary | ICD-10-CM

## 2015-07-31 DIAGNOSIS — S92911A Unspecified fracture of right toe(s), initial encounter for closed fracture: Secondary | ICD-10-CM

## 2015-07-31 NOTE — Telephone Encounter (Signed)
-----   Message from Rubie Maid, MD sent at 07/30/2015  7:27 PM EDT ----- Please inform patient of normal extended panel vaginal swab. No signs of infection.

## 2015-07-31 NOTE — Progress Notes (Signed)
She presents today for follow-up of her fracture proximal phalanx hallux right. She states rest and walk with a limp and have recently just started wearing her regular shoes and not the Darco shoe. She states it is still tender right ear as she points to the first metatarsophalangeal joint.  Objective: Vital signs are stable she is alert and oriented 3 she has tenderness on palpation of the base of the first metatarsophalangeal joint area and of the proximal phalanx. Radiographs confirmed today well-healing fracture to the proximal phalanx. No other fractures are identified. She has a subcutaneous hematoma between the first and second digits which is well defined subcutaneously.  Assessment: Well-healing fracture hallux right.  Plan: Follow up with her as needed.

## 2015-07-31 NOTE — Telephone Encounter (Signed)
Called pt no answer, LM for pt informing her of negative results

## 2015-08-16 ENCOUNTER — Telehealth: Payer: Self-pay

## 2015-08-16 DIAGNOSIS — R109 Unspecified abdominal pain: Secondary | ICD-10-CM

## 2015-08-16 NOTE — Telephone Encounter (Signed)
Ok to refer GI, Kim meals for abdominal pain and diarrhea

## 2015-08-16 NOTE — Telephone Encounter (Signed)
Dr. Tamala Julian was going to refer patient back to [facility], and due to her [facility] [place] access we need to give the authorization but order fore referral already put in. Patient would like to get this done ASAP due to bad stomach pains and constant diarrhea. Please review.-aa

## 2015-08-28 ENCOUNTER — Ambulatory Visit (INDEPENDENT_AMBULATORY_CARE_PROVIDER_SITE_OTHER): Payer: Medicaid Other | Admitting: Podiatry

## 2015-08-28 ENCOUNTER — Other Ambulatory Visit: Payer: Self-pay | Admitting: Family Medicine

## 2015-08-28 ENCOUNTER — Ambulatory Visit: Payer: Self-pay

## 2015-08-28 VITALS — BP 121/64

## 2015-08-28 DIAGNOSIS — K219 Gastro-esophageal reflux disease without esophagitis: Secondary | ICD-10-CM

## 2015-08-28 DIAGNOSIS — G5791 Unspecified mononeuropathy of right lower limb: Secondary | ICD-10-CM

## 2015-08-28 DIAGNOSIS — M67471 Ganglion, right ankle and foot: Secondary | ICD-10-CM | POA: Diagnosis not present

## 2015-08-28 DIAGNOSIS — S92911D Unspecified fracture of right toe(s), subsequent encounter for fracture with routine healing: Secondary | ICD-10-CM

## 2015-08-28 NOTE — Progress Notes (Signed)
She presents today for follow-up of a fracture proximal phalanx. She states that the majority of the pain in his right here and here she points to a small area of swelling dorsal lateral aspect of the first metatarsophalangeal joint and the medial aspect of the first metatarsophalangeal joint. She states that the proximal phalanx no longer hurts.  Objective: Vital signs are stable she is alert and oriented 3. She has pain on end range of motion and on direct palpation of the 2 areas mentioned above. There is mild edema no cellulitis drainage or odor. Redressed taken today do not demonstrate a fracture to the proximal phalanx which is gone on to heal uneventfully.  Assessment: Neuritis capsulitis cannot rule out a ganglion cyst.  Plan: I injected medial aspect of the first metatarsophalangeal joint today to the point of maximal tenderness with dexamethasone and local anesthetic. We discussed in great detail that if this does not alleviate her symptoms I feel that it would be necessary to get an MRI of the first metatarsophalangeal joint with contrast right foot. Follow up with me in 2-3 weeks.

## 2015-08-29 NOTE — Telephone Encounter (Signed)
Dr Caryn Section I think this is your patient and we filled this last when you were unavailable. Pantoprazole 40 mg daily, [facility] [place] [facility]

## 2015-09-02 ENCOUNTER — Other Ambulatory Visit: Payer: Self-pay

## 2015-09-02 NOTE — Telephone Encounter (Signed)
Pharmacy sent fax for prescription refill. Patient also called requesting a refill. Erin Richards states Dr. Tiffany Kocher originally prescribed this medication, but per patient, Dr. Venia Minks agreed to continue to refill this for the patient.

## 2015-09-11 NOTE — Telephone Encounter (Signed)
error 

## 2015-09-16 ENCOUNTER — Ambulatory Visit (INDEPENDENT_AMBULATORY_CARE_PROVIDER_SITE_OTHER): Payer: Medicaid Other

## 2015-09-16 ENCOUNTER — Encounter: Payer: Self-pay | Admitting: Podiatry

## 2015-09-16 ENCOUNTER — Ambulatory Visit (INDEPENDENT_AMBULATORY_CARE_PROVIDER_SITE_OTHER): Payer: Medicaid Other | Admitting: Podiatry

## 2015-09-16 VITALS — BP 119/68 | HR 64 | Resp 12

## 2015-09-16 DIAGNOSIS — S92911D Unspecified fracture of right toe(s), subsequent encounter for fracture with routine healing: Secondary | ICD-10-CM

## 2015-09-16 DIAGNOSIS — M6740 Ganglion, unspecified site: Secondary | ICD-10-CM

## 2015-09-16 NOTE — Progress Notes (Signed)
She presents today for follow-up of her capsulitis and ganglion cyst dorsal aspect right foot. She states that is 100% better she has absolutely no pain.  Objective: Vital signs are stable alert and oriented 3 mild erythema overlying the first intermetatarsal space right foot. The ganglion cyst appears to have been resolved 100%. Regress taken today do not demonstrate any type of osseus abnormalities stress fracture hallux proximal phalanx has resolved.  Assessment: Resolution of ganglion cyst and capsulitis right first metatarsophalangeal joint area.  Plan: Follow up with [place] on an as-needed basis.

## 2015-09-24 DIAGNOSIS — Z8619 Personal history of other infectious and parasitic diseases: Secondary | ICD-10-CM

## 2015-09-24 DIAGNOSIS — Z8719 Personal history of other diseases of the digestive system: Secondary | ICD-10-CM | POA: Insufficient documentation

## 2015-09-24 HISTORY — DX: Personal history of other diseases of the digestive system: Z87.19

## 2015-09-24 HISTORY — DX: Personal history of other infectious and parasitic diseases: Z86.19

## 2015-10-29 ENCOUNTER — Other Ambulatory Visit: Payer: Self-pay | Admitting: Family Medicine

## 2015-10-29 NOTE — Telephone Encounter (Signed)
Has appointment with Dr. Caryn Section 11/25/2015. Is written as requested by pharmacy. Renaldo Fiddler, CMA

## 2015-10-30 ENCOUNTER — Other Ambulatory Visit
Admission: RE | Admit: 2015-10-30 | Discharge: 2015-10-30 | Disposition: A | Discharge status: Home / Self Care | Payer: Medicaid Other | Source: Ambulatory Visit | Attending: Nurse Practitioner | Admitting: Nurse Practitioner

## 2015-10-30 DIAGNOSIS — L290 Pruritus ani: Secondary | ICD-10-CM | POA: Insufficient documentation

## 2015-10-30 DIAGNOSIS — R198 Other specified symptoms and signs involving the digestive system and abdomen: Secondary | ICD-10-CM | POA: Insufficient documentation

## 2015-10-30 LAB — GASTROINTESTINAL PANEL BY PCR, STOOL (REPLACES STOOL CULTURE)

## 2015-11-13 ENCOUNTER — Other Ambulatory Visit: Payer: Self-pay | Admitting: Family Medicine

## 2015-11-13 MED ORDER — IPRATROPIUM BROMIDE 0.03 % NA SOLN: 2.0000 | Freq: Every day | NASAL | 3 refills | Status: DC | PRN

## 2015-11-13 MED ORDER — ALBUTEROL SULFATE HFA 108 (90 BASE) MCG/ACT IN AERS: 1.0000 | INHALATION_SPRAY | Freq: Four times a day (QID) | RESPIRATORY_TRACT | 0 refills | Status: DC | PRN

## 2015-11-13 MED ORDER — IPRATROPIUM-ALBUTEROL 20-100 MCG/ACT IN AERS: 1.0000 | INHALATION_SPRAY | RESPIRATORY_TRACT | 3 refills | Status: DC | PRN

## 2015-11-13 NOTE — Telephone Encounter (Signed)
Pt stated that her nose is running really bad and she would like the following medications:  1. Ipratropium Bromide 0.03%, 2 (two) Spray(s) daily as needed, 1 Solution, 08/20/2010, No Refill. Inactive  2.Rhinocort  [facility]. Pt stated that Dr. Venia Minks prescribed both in the past and she needs this for her runny nose. I don't see Rhinocort on pt's list of medications in [facility] or [facility]. Please advise. Thanks TNP

## 2015-11-13 NOTE — Telephone Encounter (Signed)
Dr. Venia Minks prescribed Fluticasone last, not Rhinocort. She should still have refills on fluticasone. I went ahead and sent a refill for ipratropium.

## 2015-11-13 NOTE — Telephone Encounter (Signed)
Patient advised.   Patient is requesting refills on Albuterol and Combivent also.

## 2015-11-13 NOTE — Telephone Encounter (Signed)
Please advise 

## 2015-11-25 ENCOUNTER — Encounter: Payer: Self-pay | Admitting: Family Medicine

## 2015-11-25 ENCOUNTER — Ambulatory Visit (INDEPENDENT_AMBULATORY_CARE_PROVIDER_SITE_OTHER): Payer: Medicaid Other | Admitting: Family Medicine

## 2015-11-25 VITALS — BP 98/60 | HR 66 | Temp 97.5°F | Resp 16 | Ht <= 58 in | Wt 147.0 lb

## 2015-11-25 DIAGNOSIS — Q256 Stenosis of pulmonary artery: Secondary | ICD-10-CM | POA: Diagnosis not present

## 2015-11-25 DIAGNOSIS — K589 Irritable bowel syndrome without diarrhea: Secondary | ICD-10-CM

## 2015-11-25 DIAGNOSIS — J0110 Acute frontal sinusitis, unspecified: Secondary | ICD-10-CM

## 2015-11-25 DIAGNOSIS — F32A Depression, unspecified: Secondary | ICD-10-CM

## 2015-11-25 DIAGNOSIS — R296 Repeated falls: Secondary | ICD-10-CM

## 2015-11-25 DIAGNOSIS — F329 Major depressive disorder, single episode, unspecified: Secondary | ICD-10-CM

## 2015-11-25 DIAGNOSIS — G43809 Other migraine, not intractable, without status migrainosus: Secondary | ICD-10-CM | POA: Diagnosis not present

## 2015-11-25 DIAGNOSIS — R21 Rash and other nonspecific skin eruption: Secondary | ICD-10-CM

## 2015-11-25 DIAGNOSIS — H9193 Unspecified hearing loss, bilateral: Secondary | ICD-10-CM

## 2015-11-25 DIAGNOSIS — M25571 Pain in right ankle and joints of right foot: Secondary | ICD-10-CM | POA: Diagnosis not present

## 2015-11-25 DIAGNOSIS — R0602 Shortness of breath: Secondary | ICD-10-CM | POA: Diagnosis not present

## 2015-11-25 MED ORDER — BENZONATATE 100 MG PO CAPS: 100.0000 mg | ORAL_CAPSULE | Freq: Two times a day (BID) | ORAL | 0 refills | Status: DC | PRN

## 2015-11-25 MED ORDER — TRIAMCINOLONE ACETONIDE 0.1 % EX LOTN: 1.0000 "application " | TOPICAL_LOTION | Freq: Two times a day (BID) | CUTANEOUS | 0 refills | Status: DC

## 2015-11-25 MED ORDER — AZITHROMYCIN 250 MG PO TABS: ORAL_TABLET | ORAL | 0 refills | Status: AC

## 2015-11-25 MED ORDER — ALBUTEROL SULFATE HFA 108 (90 BASE) MCG/ACT IN AERS: 1.0000 | INHALATION_SPRAY | Freq: Four times a day (QID) | RESPIRATORY_TRACT | 5 refills | Status: DC | PRN
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
## 2015-11-25 NOTE — Progress Notes (Signed)
Patient: Erin Richards Female    DOB: Nov 21, 1965   50 y.o.   MRN: [id number] Visit Date: 11/25/2015  Today's Provider: Lelon Huh, MD   Chief Complaint  Patient presents with  . Follow-up  . Depression  . Anxiety  . Irritable Bowel Syndrome  . Rash  . Shortness of Breath   Subjective:    HPI  This is a previous patient of Dr. Venia Minks present today as new patient to me to establish care and follow up on chronic medical problems.   Major depressive disorder, single episode, moderate (HCC) Continues of low dose of citalopram which she feels remains effective. Denies any adverse effect.   Other migraine without status migrainosus, not intractable Is taking nortriptyline qhs and states headaches have been well controlled. Denies adverse effect from medication  Other seasonal allergic rhinitis She continue on ceitirzine and Singulair, which had been working well, but states over the last few weeks has had increasing trouble with sinus drainage, pressure and congestion. Drainage is yellow and green.   Sinus congestion She also complains of episodes of hearing getting muffles in both ears, feels congested. She states this has been going on for several months,  Even before her sinuses started feeling congested.   Ankle weakness Also complains of right ankle feeling weak and giving out from under her causing her to fall.   Rash She states that over the last couple of weeks she has had a very itchy rash across back. She has tried Benadryl cream which really doesn't work well.   Shortness of breath Sh reports long history of intermittent episodes of shortness of breath. She states that her cardiologist has attributed this to underlying heart conditions including pulmonary artery stenosis and ASD. She states she was prescribed albuterol to take prn and it is very effective. She denies any underlying lung disease.    Allergies  Allergen Reactions  . Acetaminophen-Codeine  Nausea And Vomiting  . Antiseptic Oral Rinse  [Cetylpyridinium Chloride] Other (See Comments)    UNK  . Aspartame Other (See Comments)    Reaction: unknown  . Aspartame And Phenylalanine Nausea And Vomiting  . Biaxin [Clarithromycin] Nausea And Vomiting  . Carafate [Sucralfate]     Pt says her "Stomach hurts" when she takes it.    . Chlorhexidine Other (See Comments)  . Chlorhexidine Gluconate Nausea And Vomiting  . Clindamycin Diarrhea and Nausea And Vomiting  . Clindamycin/Lincomycin Nausea And Vomiting  . Codeine Itching and Nausea And Vomiting  . Dextromethorphan Hbr Other (See Comments)    Reaction: unknown  . Dilaudid [Hydromorphone Hcl] Nausea And Vomiting  . Doxycycline Nausea And Vomiting  . Fentanyl Nausea And Vomiting  . Germanium Other (See Comments)  . Hydrocodone Nausea And Vomiting  . Ketorolac Other (See Comments)  . Levofloxacin Other (See Comments)    GI upset. GI upset  . Mefenamic Acid Nausea And Vomiting  . Metformin And Related Nausea And Vomiting  . Metronidazole Diarrhea and Nausea And Vomiting  . Morphine And Related Nausea And Vomiting  . Morphine Sulfate Itching and Nausea And Vomiting  . Moxifloxacin Swelling  . Moxifloxacin Hcl In Nacl Other (See Comments)    Reaction: unknown  . Nitrofuran Derivatives Other (See Comments)    Reaction: unknown  . Nitrofurantoin Nausea And Vomiting and Other (See Comments)  . Nitrofurantoin Monohyd Macro Other (See Comments)    Reaction: unknown  . Nsaids Other (See Comments)    Reaction: unknown  .  Other Other (See Comments)  . Oxycodone-Acetaminophen Nausea And Vomiting  . Periguard [Dimethicone] Nausea And Vomiting  . Phenothiazines Nausea And Vomiting  . Pioglitazone Nausea And Vomiting  . Quinidine Nausea And Vomiting  . Quinine Derivatives Nausea And Vomiting  . Quinolones Nausea And Vomiting  . Rumex Crispus Other (See Comments)  . Tetracycline Nausea And Vomiting  . Tetracyclines & Related Nausea  And Vomiting  . Toradol [Ketorolac Tromethamine] Nausea And Vomiting  . Tramadol Nausea And Vomiting  . Tussin [Guaifenesin] Nausea And Vomiting  . Tussionex Pennkinetic Er [Hydrocod Polst-Cpm Polst Er] Nausea And Vomiting  . Buprenorphine Hcl Nausea And Vomiting  . Hydrocodone-Chlorpheniramine Nausea And Vomiting  . Hydromorphone Nausea And Vomiting and Other (See Comments)    UNK  . Lincomycin Hcl Nausea And Vomiting  . Oxycodone-Acetaminophen Hives and Nausea And Vomiting    Other reaction(s): Nausea And Vomiting, Unknown  . Phenylalanine Nausea And Vomiting     Current Outpatient Prescriptions:  .  albuterol (PROAIR HFA) 108 (90 Base) MCG/ACT inhaler, Inhale 1-2 puffs into the lungs every 6 (six) hours as needed for shortness of breath., Disp: 8.5 Inhaler, Rfl: 0 .  cetirizine (ZYRTEC) 10 MG tablet, TAKE 1 TABLET BY MOUTH EVERY DAY, Disp: 30 tablet, Rfl: 11 .  Chloramphenicol POWD, Take one capsule (500mg ) by mouth every 6 hours for 7 days., Disp: 28 Bottle, Rfl: 0 .  citalopram (CELEXA) 20 MG tablet, Take 0.5 tablets (10 mg total) by mouth daily., Disp: 15 tablet, Rfl: 5 .  dicyclomine (BENTYL) 20 MG tablet, Take 20 mg by mouth every 4 (four) hours as needed for spasms., Disp: , Rfl:  .  estradiol (ESTRACE) 0.5 MG tablet, Take 1 tablet (0.5 mg total) by mouth daily., Disp: 30 tablet, Rfl: 5 .  fluconazole (DIFLUCAN) 150 MG tablet, Take 1 tablet (150 mg total) by mouth once. And then repeat in one week., Disp: 2 tablet, Rfl: 0 .  fluticasone (FLONASE) 50 MCG/ACT nasal spray, Place 2 sprays into both nostrils daily as needed for allergies or rhinitis. Reported on 04/03/2015, Disp: 16 g, Rfl: 5 .  gabapentin (NEURONTIN) 300 MG capsule, Take 1 capsule (300 mg total) by mouth daily., Disp: 30 capsule, Rfl: 5 .  ipratropium (ATROVENT) 0.03 % nasal spray, Place 2 sprays into the nose daily as needed., Disp: 30 mL, Rfl: 3 .  Ipratropium-Albuterol (COMBIVENT RESPIMAT) 20-100 MCG/ACT AERS  respimat, Inhale 1 puff into the lungs every 4 (four) hours as needed for wheezing. Reported on 04/03/2015, Disp: 4 g, Rfl: 3 .  loperamide (IMODIUM A-D) 2 MG tablet, Take 1 tablet (2 mg total) by mouth 4 (four) times daily as needed for diarrhea or loose stools., Disp: 30 tablet, Rfl: 0 .  montelukast (SINGULAIR) 10 MG tablet, Take 1 tablet (10 mg total) by mouth at bedtime. Reported on 04/03/2015, Disp: 30 tablet, Rfl: 5 .  nortriptyline (PAMELOR) 50 MG capsule, Take 1 capsule (50 mg total) by mouth daily as needed (headache)., Disp: 30 capsule, Rfl: 5 .  pantoprazole (PROTONIX) 40 MG tablet, Take 40 mg by mouth daily., Disp: , Rfl:  .  pantoprazole (PROTONIX) 40 MG tablet, TAKE 1 TABLET BY MOUTH TWICE A DAY, Disp: 60 tablet, Rfl: 5  Review of Systems  Constitutional: Negative for appetite change, chills, fatigue and fever.  Respiratory: Negative for chest tightness and shortness of breath.   Cardiovascular: Negative for chest pain and palpitations.  Gastrointestinal: Negative for abdominal pain, nausea and vomiting.  Neurological: Negative for  dizziness and weakness.    Social History  Substance Use Topics  . Smoking status: Never Smoker  . Smokeless tobacco: Never Used  . Alcohol use No   Objective:   BP 98/60 (BP Location: Right Arm, Patient Position: Sitting, Cuff Size: Normal)   Pulse 66   Temp 97.5 F (36.4 C) (Oral)   Resp 16   Ht 4\' 10"  (1.473 m)   Wt 147 lb (66.7 kg)   SpO2 96%   BMI 30.72 kg/m   Physical Exam   General Appearance:    Alert, cooperative, no distress  HEENT:   Ear canals patent. TM pearly grey, no erythema or fullness of TMs. Tender frontal sinuses. Tender frontal sinuses.   Eyes:    PERRL, conjunctiva/corneas clear, EOM's intact, mild nasal congestion. No discharge.        Lungs:     Clear to auscultation bilaterally, respirations unlabored  Heart:    Regular rate and rhythm  Neurologic:   Awake, alert, oriented x 3. No apparent focal neurological            defect.   MS:  Slight right ankle instability. Tender right medial knee. No swelling.   Skin:  Very fine scattered papular rash across mid and upper back with several excoriations.        Assessment & Plan:     1. Hearing difficulty of both ears She state hearing has been getting progressively worse for several month with no associated ear pain, tinnitus or drainage. She request evaluation by ENT - Ambulatory referral to ENT  2. Acute right ankle pain  - Ambulatory referral to Orthopedic Surgery  3. Rash  - triamcinolone lotion (KENALOG) 0.1 %; Apply 1 application topically 2 (two) times daily.  Dispense: 60 mL; Refill: 0  4. Frequent falls   5. Depression, unspecified depression type Stable on citalopram which she would like to continue  6. Peripheral pulmonary artery stenosis Continue routine cardiology follow up.   7. Irritable bowel syndrome, unspecified type Stable. Continue current medications.    8. Other migraine without status migrainosus, not intractable Relatively well controlled on nortriptylline and will continue unchanged.   9. Shortness of breath She states she has no history butt DOE is related to underlying cardiac condition and responds well to [employer] which she requests refill for today.  - albuterol (PROAIR HFA) 108 (90 Base) MCG/ACT inhaler; Inhale 1-2 puffs into the lungs every 6 (six) hours as needed for shortness of breath.  Dispense: 8.5 Inhaler; Refill: 5  10. Acute frontal sinusitis, recurrence not specified  - azithromycin (ZITHROMAX) 250 MG tablet; 2 by mouth today, then 1 daily for 4 days  Dispense: 6 tablet; Refill: 0 - benzonatate (TESSALON) 100 MG capsule; Take 1 capsule (100 mg total) by mouth 2 (two) times daily as needed for cough.  Dispense: 20 capsule; Refill: 0      Addressed extensive list of chronic and acute medical problems today requiring extensive time in counseling and coordination care.  Over half of this 45 minute visit  were spent in counseling and coordinating care of multiple medical problems.  The entirety of the information documented in the History of Present Illness, Review of Systems and Physical Exam were personally obtained by me. Portions of this information were initially documented by April M. Sabra Heck, CMA and reviewed by me for thoroughness and accuracy.   Lelon Huh, MD  [facility]

## 2016-01-28 ENCOUNTER — Other Ambulatory Visit: Payer: Self-pay | Admitting: Family Medicine

## 2016-01-28 DIAGNOSIS — F321 Major depressive disorder, single episode, moderate: Secondary | ICD-10-CM

## 2016-01-28 NOTE — Telephone Encounter (Signed)
Please review-aa 

## 2016-02-18 ENCOUNTER — Telehealth: Payer: Self-pay | Admitting: Family Medicine

## 2016-02-18 ENCOUNTER — Ambulatory Visit
Admission: RE | Admit: 2016-02-18 | Discharge: 2016-02-18 | Disposition: A | Discharge status: Home / Self Care | Payer: Medicaid Other | Source: Ambulatory Visit | Attending: Family Medicine | Admitting: Family Medicine

## 2016-02-18 ENCOUNTER — Ambulatory Visit (INDEPENDENT_AMBULATORY_CARE_PROVIDER_SITE_OTHER): Payer: Medicaid Other | Admitting: Family Medicine

## 2016-02-18 ENCOUNTER — Encounter: Payer: Self-pay | Admitting: Family Medicine

## 2016-02-18 VITALS — BP 100/60 | HR 76 | Temp 98.0°F | Resp 18 | Wt 147.0 lb

## 2016-02-18 DIAGNOSIS — J40 Bronchitis, not specified as acute or chronic: Secondary | ICD-10-CM | POA: Diagnosis not present

## 2016-02-18 DIAGNOSIS — R0609 Other forms of dyspnea: Secondary | ICD-10-CM | POA: Insufficient documentation

## 2016-02-18 DIAGNOSIS — R05 Cough: Secondary | ICD-10-CM | POA: Insufficient documentation

## 2016-02-18 DIAGNOSIS — I517 Cardiomegaly: Secondary | ICD-10-CM | POA: Diagnosis not present

## 2016-02-18 MED ORDER — AZITHROMYCIN 250 MG PO TABS: ORAL_TABLET | ORAL | 0 refills | Status: AC

## 2016-02-18 NOTE — Telephone Encounter (Signed)
Pt stated that she spoke with Melissa at [facility] about being on oxygen for 24 hours and getting a portable oxygen system. Pt stated Melissa advised that her oxygen level would have to be 88 or lower in order for insurance to pay for it and that they would need to discuss this with Dr. Caryn Section. Pt would like our office to contact Melissa with [employer] @ [phone]. Please advise. Thanks TNP

## 2016-02-18 NOTE — Telephone Encounter (Signed)
Please review. I saw that patient came in for an OV today concerning this.

## 2016-02-18 NOTE — Telephone Encounter (Signed)
Pt called back to see if Dr. Caryn Section could go ahead and call in the medication that he was going to call in after getting the results of the Xrays b/c she doesn't live in town and she doesn't want to go home and drive back out to pick up medication. Please advise. Thanks TNP

## 2016-02-18 NOTE — Telephone Encounter (Signed)
Results are still pending.

## 2016-02-18 NOTE — Patient Instructions (Signed)
Go to the [facility] on [address] for ch\\\\\\\\\\\\\\\\\]= [employer]

## 2016-02-18 NOTE — Progress Notes (Addendum)
Patient: Erin Richards Female    DOB: 03/27/1965   51 y.o.   MRN: [id number] Visit Date: 02/18/2016  Today's Provider: Lelon Huh, MD   Chief Complaint  Patient presents with  . Follow-up  . Shortness of Breath   Subjective:    HPI  Shortness of breath . Patient presents today reporting that she has been short of breath during the day time and thinks she may need oxygen during exertion. Patient currently uses night time oxygen only occasionally.  States that she has having increasing dyspnea on exerition the last 2-3 months. Went to [place] last week for her son's orientation and had a great deal of difficulty with shortness of breath. 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 She also reports coughing up green phelgmn the last four days.  Has history of recurrent bronchitis.   Allergies  Allergen Reactions  . Acetaminophen-Codeine Nausea And Vomiting  . Antiseptic Oral Rinse  [Cetylpyridinium Chloride] Other (See Comments)    UNK  . Aspartame Other (See Comments)    Reaction: unknown  . Aspartame And Phenylalanine Nausea And Vomiting  . Biaxin [Clarithromycin] Nausea And Vomiting  . Carafate [Sucralfate]     Pt says her "Stomach hurts" when she takes it.    . Chlorhexidine Other (See Comments)  . Chlorhexidine Gluconate Nausea And Vomiting  . Clindamycin Diarrhea and Nausea And Vomiting  . Clindamycin/Lincomycin Nausea And Vomiting  . Codeine Itching and Nausea And Vomiting  . Dextromethorphan Hbr Other (See Comments)    Reaction: unknown  . Dilaudid [Hydromorphone Hcl] Nausea And Vomiting  . Doxycycline Nausea And Vomiting  . Fentanyl Nausea And Vomiting  . Germanium Other (See Comments)  . Hydrocodone Nausea And Vomiting  . Ketorolac Other (See Comments)  . Levofloxacin Other (See Comments)    GI upset. GI upset  . Mefenamic Acid Nausea And Vomiting  .  Metformin And Related Nausea And Vomiting  . Metronidazole Diarrhea and Nausea And Vomiting  . Morphine And Related Nausea And Vomiting  . Morphine Sulfate Itching and Nausea And Vomiting  . Moxifloxacin Swelling  . Moxifloxacin Hcl In Nacl Other (See Comments)    Reaction: unknown  . Nitrofuran Derivatives Other (See Comments)    Reaction: unknown  . Nitrofurantoin Nausea And Vomiting and Other (See Comments)  . Nitrofurantoin Monohyd Macro Other (See Comments)    Reaction: unknown  . Nsaids Other (See Comments)    Reaction: unknown  . Other Other (See Comments)  . Oxycodone-Acetaminophen Nausea And Vomiting  . Periguard [Dimethicone] Nausea And Vomiting  . Phenothiazines Nausea And Vomiting  . Pioglitazone Nausea And Vomiting  . Quinidine Nausea And Vomiting  . Quinine Derivatives Nausea And Vomiting  . Quinolones Nausea And Vomiting  . Rumex Crispus Other (See Comments)  . Tetracycline Nausea And Vomiting  . Tetracyclines & Related Nausea And Vomiting  . Toradol [Ketorolac Tromethamine] Nausea And Vomiting  . Tramadol Nausea And Vomiting  . Tussin [Guaifenesin] Nausea And Vomiting  . Tussionex Pennkinetic Er [Hydrocod Polst-Cpm Polst Er] Nausea And Vomiting  . Buprenorphine Hcl Nausea And Vomiting  . Hydrocodone-Chlorpheniramine Nausea And Vomiting  . Hydromorphone Nausea And Vomiting and Other (See Comments)    UNK  . Lincomycin Hcl Nausea And Vomiting  . Oxycodone-Acetaminophen Hives and Nausea And Vomiting    Other reaction(s): Nausea And Vomiting, Unknown  . Phenylalanine Nausea And Vomiting  Current Outpatient Prescriptions:  .  albuterol (PROAIR HFA) 108 (90 Base) MCG/ACT inhaler, Inhale 1-2 puffs into the lungs every 6 (six) hours as needed for shortness of breath., Disp: 8.5 Inhaler, Rfl: 5 .  cetirizine (ZYRTEC) 10 MG tablet, TAKE 1 TABLET BY MOUTH EVERY DAY, Disp: 30 tablet, Rfl: 11 .  Chloramphenicol POWD, Take one capsule (500mg ) by mouth every 6 hours for  7 days., Disp: 28 Bottle, Rfl: 0 .  citalopram (CELEXA) 20 MG tablet, TAKE 0.5 TABLETS (10 MG TOTAL) BY MOUTH DAILY., Disp: 15 tablet, Rfl: 5 .  dicyclomine (BENTYL) 20 MG tablet, Take 20 mg by mouth every 4 (four) hours as needed for spasms., Disp: , Rfl:  .  estradiol (ESTRACE) 0.5 MG tablet, Take 1 tablet (0.5 mg total) by mouth daily., Disp: 30 tablet, Rfl: 5 .  fexofenadine (ALLEGRA) 60 MG tablet, Take 60 mg by mouth 2 (two) times daily., Disp: , Rfl:  .  fluconazole (DIFLUCAN) 150 MG tablet, Take 1 tablet (150 mg total) by mouth once. And then repeat in one week., Disp: 2 tablet, Rfl: 0 .  fluticasone (FLONASE) 50 MCG/ACT nasal spray, Place 2 sprays into both nostrils daily as needed for allergies or rhinitis. Reported on 04/03/2015, Disp: 16 g, Rfl: 5 .  gabapentin (NEURONTIN) 300 MG capsule, TAKE 1 CAPSULE (300 MG TOTAL) BY MOUTH DAILY., Disp: 30 capsule, Rfl: 12 .  ipratropium (ATROVENT) 0.03 % nasal spray, Place 2 sprays into the nose daily as needed., Disp: 30 mL, Rfl: 3 .  Ipratropium-Albuterol (COMBIVENT RESPIMAT) 20-100 MCG/ACT AERS respimat, Inhale 1 puff into the lungs every 4 (four) hours as needed for wheezing. Reported on 04/03/2015, Disp: 4 g, Rfl: 3 .  loperamide (IMODIUM A-D) 2 MG tablet, Take 1 tablet (2 mg total) by mouth 4 (four) times daily as needed for diarrhea or loose stools., Disp: 30 tablet, Rfl: 0 .  montelukast (SINGULAIR) 10 MG tablet, Take 1 tablet (10 mg total) by mouth at bedtime. Reported on 04/03/2015, Disp: 30 tablet, Rfl: 5 .  nortriptyline (PAMELOR) 50 MG capsule, Take 1 capsule (50 mg total) by mouth daily as needed (headache)., Disp: 30 capsule, Rfl: 5 .  pantoprazole (PROTONIX) 40 MG tablet, TAKE 1 TABLET BY MOUTH TWICE A DAY, Disp: 60 tablet, Rfl: 5 .  triamcinolone lotion (KENALOG) 0.1 %, Apply 1 application topically 2 (two) times daily., Disp: 60 mL, Rfl: 0  Review of Systems  Constitutional: Negative for appetite change, chills, fatigue and fever.  HENT:  Positive for rhinorrhea and sore throat.   Respiratory: Positive for cough and shortness of breath (during exertion). Negative for chest tightness.   Cardiovascular: Negative for chest pain and palpitations.  Gastrointestinal: Negative for abdominal pain, nausea and vomiting.  Musculoskeletal: Positive for myalgias.  Neurological: Negative for dizziness and weakness.    Social History  Substance Use Topics  . Smoking status: Never Smoker  . Smokeless tobacco: Never Used  . Alcohol use No   Objective:    Vitals:   02/18/16 0852 02/18/16 0907  BP: 100/60   Pulse: 76   Resp: 18   Temp: 98 F (36.7 C)   TempSrc: Oral   SpO2: 98% 90% (after walking 5 minutes)  Weight: 147 lb (66.7 kg)     Physical Exam   General Appearance:    Alert, cooperative, no distress  Eyes:    PERRL, conjunctiva/corneas clear, EOM's intact       Lungs:     Clear to auscultation bilaterally  except for occasional expiratory wheezes, respirations unlabored  Heart:    Regular rate and rhythm. III/VI systolic murmur.   Neurologic:   Awake, alert, oriented x 3. No apparent focal neurological           defect.      CXR: Mild stable cardiomegaly. No active cardiopulmonary disease     Assessment & Plan:     1. Dyspnea on exertion Stable from cardiac standpoint and does not qualify for portable oxygen. This is likely due to acute bronchitis as below.  - CBC - Brain natriuretic peptide - DG Chest 2 View; Future  2. Bronchitis  - DG Chest 2 View; Future - azithromycin (ZITHROMAX) 250 MG tablet; 2 by mouth today, then 1 daily for 4 days  Dispense: 6 tablet; Refill: 0  Call if symptoms change or if not rapidly improving.    Continues to require nocturnal oxygen due to pulmonary valvular stenosis and night time hypoxia      The entirety of the information documented in the History of Present Illness, Review of Systems and Physical Exam were personally obtained by me. Portions of this information were  initially documented by Meyer Cory, CMA and reviewed by me for thoroughness and accuracy.    Lelon Huh, MD  [facility]

## 2016-02-18 NOTE — Telephone Encounter (Signed)
Advised patient as below.  

## 2016-02-18 NOTE — Telephone Encounter (Signed)
-----   Message from Birdie Sons, MD sent at 02/18/2016  1:58 PM EST ----- cxr is normal. She probably has bronchitis. Have sent rx for zpack to [facility]. Should have lab results back by Thursday.

## 2016-02-18 NOTE — Telephone Encounter (Signed)
Pt called again to see if Xray results were back and if the medication had been sent to the pharmacy. Please advise. Thanks TNP

## 2016-02-18 NOTE — Telephone Encounter (Signed)
Patient's lowest oxygen was 90%, so she won't qualify. She was sent to lab and for xrays for additional evaluation.

## 2016-02-19 LAB — CBC
Hematocrit: 37.8 % (ref 34.0–46.6)
Hemoglobin: 12.7 g/dL (ref 11.1–15.9)
MCH: 27.2 pg (ref 26.6–33.0)
MCHC: 33.6 g/dL (ref 31.5–35.7)
MCV: 81 fL (ref 79–97)
PLATELETS: 160 10*3/uL (ref 150–379)
RBC: 4.67 x10E6/uL (ref 3.77–5.28)
RDW: 14.5 % (ref 12.3–15.4)
WBC: 4.8 10*3/uL (ref 3.4–10.8)

## 2016-02-19 LAB — BRAIN NATRIURETIC PEPTIDE: BNP: 105.1 pg/mL — ABNORMAL HIGH (ref 0.0–100.0)

## 2016-02-24 ENCOUNTER — Telehealth: Payer: Self-pay | Admitting: Family Medicine

## 2016-02-24 NOTE — Telephone Encounter (Signed)
She can stop Zpack. If breathing is not improved then change to doxycycline 100mg  twice daily for 7 days.

## 2016-02-24 NOTE — Telephone Encounter (Signed)
Please review. Emily Drozdowski, CMA  

## 2016-02-24 NOTE — Telephone Encounter (Signed)
Pt advised. Has already run out of abx. Breathing is better. Renaldo Fiddler, CMA

## 2016-02-24 NOTE — Telephone Encounter (Signed)
Pt has been Z Pak and she is having a lot stomach pain.  She is having diarrhea.  Her call back is [phone]  Thanks Con Memos

## 2016-02-25 ENCOUNTER — Other Ambulatory Visit: Payer: Self-pay | Admitting: Family Medicine

## 2016-02-25 DIAGNOSIS — K219 Gastro-esophageal reflux disease without esophagitis: Secondary | ICD-10-CM

## 2016-03-22 ENCOUNTER — Emergency Department
Admission: EM | Admit: 2016-03-22 | Discharge: 2016-03-22 | Disposition: A | Discharge status: Home / Self Care | Payer: Medicaid Other | Attending: Emergency Medicine | Admitting: Emergency Medicine

## 2016-03-22 ENCOUNTER — Encounter: Payer: Self-pay | Admitting: Medical Oncology

## 2016-03-22 ENCOUNTER — Emergency Department: Payer: Medicaid Other

## 2016-03-22 ENCOUNTER — Other Ambulatory Visit: Payer: Self-pay

## 2016-03-22 DIAGNOSIS — Z79899 Other long term (current) drug therapy: Secondary | ICD-10-CM | POA: Diagnosis not present

## 2016-03-22 DIAGNOSIS — R110 Nausea: Secondary | ICD-10-CM | POA: Insufficient documentation

## 2016-03-22 DIAGNOSIS — R1010 Upper abdominal pain, unspecified: Secondary | ICD-10-CM

## 2016-03-22 DIAGNOSIS — R1012 Left upper quadrant pain: Secondary | ICD-10-CM | POA: Diagnosis not present

## 2016-03-22 DIAGNOSIS — R300 Dysuria: Secondary | ICD-10-CM | POA: Diagnosis not present

## 2016-03-22 LAB — BASIC METABOLIC PANEL
Anion gap: 8 (ref 5–15)
BUN: 6 mg/dL (ref 6–20)
CHLORIDE: 108 mmol/L (ref 101–111)
CO2: 26 mmol/L (ref 22–32)
CREATININE: 0.68 mg/dL (ref 0.44–1.00)
Calcium: 9.3 mg/dL (ref 8.9–10.3)
GFR calc non Af Amer: >60 mL/min (ref >60)
GLUCOSE: 100 mg/dL — AB (ref 65–99)
Potassium: 4.4 mmol/L (ref 3.5–5.1)
Sodium: 142 mmol/L (ref 135–145)

## 2016-03-22 LAB — TROPONIN I: Troponin I: <0.03 ng/mL (ref <0.03)

## 2016-03-22 LAB — CBC
HCT: 37.2 % (ref 35.0–47.0)
Hemoglobin: 12.6 g/dL (ref 12.0–16.0)
MCH: 27.6 pg (ref 26.0–34.0)
MCHC: 33.9 g/dL (ref 32.0–36.0)
MCV: 81.5 fL (ref 80.0–100.0)
PLATELETS: 152 10*3/uL (ref 150–440)
RBC: 4.56 MIL/uL (ref 3.80–5.20)
RDW: 14.4 % (ref 11.5–14.5)
WBC: 4.2 10*3/uL (ref 3.6–11.0)

## 2016-03-22 LAB — URINALYSIS, COMPLETE (UACMP) WITH MICROSCOPIC
Bacteria, UA: NONE SEEN
Bilirubin Urine: NEGATIVE
Glucose, UA: NEGATIVE mg/dL
Ketones, ur: NEGATIVE mg/dL
Leukocytes, UA: NEGATIVE
Nitrite: NEGATIVE
Protein, ur: NEGATIVE mg/dL
SPECIFIC GRAVITY, URINE: 1.003 — AB (ref 1.005–1.030)
pH: 6 (ref 5.0–8.0)

## 2016-03-22 LAB — HEPATIC FUNCTION PANEL
ALBUMIN: 4.2 g/dL (ref 3.5–5.0)
ALT: 10 U/L — ABNORMAL LOW (ref 14–54)
AST: 19 U/L (ref 15–41)
Alkaline Phosphatase: 55 U/L (ref 38–126)
BILIRUBIN DIRECT: 0.1 mg/dL (ref 0.1–0.5)
BILIRUBIN TOTAL: 1 mg/dL (ref 0.3–1.2)
Indirect Bilirubin: 0.9 mg/dL (ref 0.3–0.9)
Total Protein: 7.2 g/dL (ref 6.5–8.1)

## 2016-03-22 LAB — LIPASE, BLOOD: Lipase: 31 U/L (ref 11–51)

## 2016-03-22 MED ORDER — ONDANSETRON HCL 4 MG/2ML IJ SOLN
4.0000 mg | Freq: Once | INTRAMUSCULAR | Status: AC
  Administered 2016-03-22: 4 mg via INTRAVENOUS

## 2016-03-22 MED ORDER — IOPAMIDOL (ISOVUE-300) INJECTION 61%
100.0000 mL | Freq: Once | INTRAVENOUS | Status: AC | PRN
  Administered 2016-03-22: 100 mL via INTRAVENOUS

## 2016-03-22 MED ORDER — ONDANSETRON HCL 4 MG/2ML IJ SOLN
INTRAMUSCULAR | Status: AC
  Filled 2016-03-22: qty 2

## 2016-03-22 MED ORDER — PROMETHAZINE HCL 25 MG/ML IJ SOLN
25.0000 mg | Freq: Once | INTRAMUSCULAR | Status: AC
Start: 2016-03-22 — End: 2016-03-22
  Administered 2016-03-22: 25 mg via INTRAVENOUS
  Filled 2016-03-22: qty 1

## 2016-03-22 MED ORDER — ALUM & MAG HYDROXIDE-SIMETH 200-200-20 MG/5ML PO SUSP
30.0000 mL | Freq: Once | ORAL | Status: AC
  Administered 2016-03-22: 30 mL via ORAL
  Filled 2016-03-22: qty 30

## 2016-03-22 MED ORDER — ACETAMINOPHEN 325 MG PO TABS
650.0000 mg | ORAL_TABLET | Freq: Once | ORAL | Status: AC
  Administered 2016-03-22: 650 mg via ORAL
  Filled 2016-03-22: qty 2

## 2016-03-22 MED ORDER — IOPAMIDOL (ISOVUE-300) INJECTION 61%
30.0000 mL | Freq: Once | INTRAVENOUS | Status: AC | PRN
  Administered 2016-03-22: 30 mL via ORAL

## 2016-03-22 MED ORDER — PROMETHAZINE HCL 12.5 MG PO TABS: 12.5000 mg | ORAL_TABLET | Freq: Four times a day (QID) | ORAL | 0 refills | Status: DC | PRN

## 2016-03-22 NOTE — ED Provider Notes (Signed)
Patient received in sign-out from Dr. Joni Fears.  Workup and evaluation pending CT imaging.  CT scan shows no acute abnormality's. Presentation most is his gastritis. Patient arrives hemodynamically stable. Stable for discharge with outpatient follow-up.  Patient was able to tolerate PO and was able to ambulate with a steady gait.  Have discussed with the patient and available family all diagnostics and treatments performed thus far and all questions were answered to the best of my ability. The patient demonstrates understanding and agreement with plan.      [name], MD 03/22/16 [phone]

## 2016-03-22 NOTE — ED Notes (Signed)
Pt presents with chest and abdominal pain since last night. Pt states she is having the worst pain of her life and is tearful during assessment. Pt states she can't drink contrast, but this nurse encouraged her to continue trying.

## 2016-03-22 NOTE — ED Notes (Signed)
Pt called out from [facility] to use bathroom. Pt assisted to bathroom by this tech. UA obtained.

## 2016-03-22 NOTE — ED Notes (Signed)
Pt discharged home after verbalizing understanding of discharge instructions; nad noted. 

## 2016-03-22 NOTE — ED Triage Notes (Signed)
Pt was pulled out of car with reports of left sided chest pain that is a stabbing pain, began this am, pt reports that she has burning sensation to center of chest. Pt also reports she was up most of the night with upper abd pain. Reports occasional sob.
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
## 2016-03-22 NOTE — ED Notes (Signed)
Pt dry heaving, states that she feels like the contrast is trying to come back, ct called and notified

## 2016-03-23 ENCOUNTER — Other Ambulatory Visit: Payer: Self-pay | Admitting: *Deleted

## 2016-03-23 MED ORDER — ESTRADIOL 0.5 MG PO TABS: 0.5000 mg | ORAL_TABLET | Freq: Every day | ORAL | 5 refills | Status: DC

## 2016-03-24 LAB — URINE CULTURE

## 2016-03-31 ENCOUNTER — Other Ambulatory Visit: Payer: Self-pay

## 2016-03-31 DIAGNOSIS — J302 Other seasonal allergic rhinitis: Secondary | ICD-10-CM

## 2016-03-31 MED ORDER — MONTELUKAST SODIUM 10 MG PO TABS: 10.0000 mg | ORAL_TABLET | Freq: Every day | ORAL | 5 refills | Status: DC

## 2016-03-31 NOTE — Telephone Encounter (Signed)
Pharmacy requesting refills. Pharmacy listed is correct. Thanks!

## 2016-04-02 ENCOUNTER — Telehealth: Payer: Self-pay

## 2016-04-02 NOTE — Telephone Encounter (Signed)
Erin Richards with [employer] states form that was sent to be completed by Dr. Caryn Section was only for [facility] purposes. That way they would know patient has been seen in office recently and not a re certification form. Patient only uses night time oxygen at this time. Patient is not due for re certification for 2 more years. She will fax the paper back for office notes to be attached and faxed back to her. CB# [phone] Fax # [phone]

## 2016-04-15 ENCOUNTER — Ambulatory Visit (INDEPENDENT_AMBULATORY_CARE_PROVIDER_SITE_OTHER): Payer: Medicaid Other | Admitting: Family Medicine

## 2016-04-15 VITALS — BP 98/48 | HR 62 | Temp 97.7°F | Resp 14 | Wt 145.0 lb

## 2016-04-15 DIAGNOSIS — J0121 Acute recurrent ethmoidal sinusitis: Secondary | ICD-10-CM

## 2016-04-15 MED ORDER — AZITHROMYCIN 250 MG PO TABS: ORAL_TABLET | ORAL | 0 refills | Status: DC

## 2016-04-15 NOTE — Patient Instructions (Signed)
Try saline irrigations, Netti Pot OTC
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
## 2016-04-21 ENCOUNTER — Telehealth: Payer: Self-pay | Admitting: Family Medicine

## 2016-04-21 MED ORDER — FLUCONAZOLE 150 MG PO TABS: 150.0000 mg | ORAL_TABLET | Freq: Once | ORAL | 0 refills | Status: AC

## 2016-04-21 MED ORDER — AMOXICILLIN-POT CLAVULANATE 875-125 MG PO TABS: 1.0000 | ORAL_TABLET | Freq: Two times a day (BID) | ORAL | 0 refills | Status: DC

## 2016-04-21 NOTE — Telephone Encounter (Signed)
Dr Caryn Section can you please review. This is your patient we saw last week when she walked in to be seen late in the day. Due to all her allergies and Dr Rosanna Randy not knowing patient well can you please review and advise. Thank you very much-aa

## 2016-04-21 NOTE — Telephone Encounter (Signed)
Yes, please.

## 2016-04-21 NOTE — Telephone Encounter (Signed)
Pt called back for an update. Pt request a call back. Thanks TNP

## 2016-04-21 NOTE — Telephone Encounter (Signed)
Can change antibiotic to amoxicillin 500mg  two twice a day for 10 days. Also use OTC flonase if not already doing so.

## 2016-04-21 NOTE — Telephone Encounter (Signed)
Patient advised as below. Patient states that she would rather have Augmentin called in because every time she takes Amoxicillin she gets a bad yeast infection. Patient wants to know if you will change it to Augmentin? If not will you call in Amoxicillin along with something for yeast infection? Please advise.

## 2016-04-21 NOTE — Telephone Encounter (Signed)
Can send in Augmentin 875 bid x 10 days and diflucan 150mg  once.

## 2016-04-21 NOTE — Telephone Encounter (Signed)
Please review or for Dr Caryn Section to review?-aa

## 2016-04-21 NOTE — Telephone Encounter (Signed)
Medications sent in and pt advised. Renaldo Fiddler, CMA

## 2016-04-21 NOTE — Telephone Encounter (Signed)
Pt states she seen Dr Carmin Richmond last week.  Pt states she has taken all the medication given.  Pt is still having a headache, face pain and has green mucus from her nose.  [facility] [place],  [phone]

## 2016-06-02 ENCOUNTER — Other Ambulatory Visit: Payer: Self-pay | Admitting: Family Medicine

## 2016-06-02 DIAGNOSIS — J302 Other seasonal allergic rhinitis: Secondary | ICD-10-CM

## 2016-06-02 MED ORDER — IPRATROPIUM BROMIDE HFA 17 MCG/ACT IN AERS: 2.0000 | INHALATION_SPRAY | Freq: Four times a day (QID) | RESPIRATORY_TRACT | 5 refills | Status: DC | PRN

## 2016-06-02 MED ORDER — CETIRIZINE HCL 10 MG PO TABS: 10.0000 mg | ORAL_TABLET | Freq: Every day | ORAL | 11 refills | Status: DC

## 2016-06-02 NOTE — Telephone Encounter (Signed)
[facility] following medication. Thanks CC  cetirizine (ZYRTEC) 10 MG tablet  Take 1 tablet by mouth every day.

## 2016-06-02 NOTE — Progress Notes (Signed)
Patient was notified.

## 2016-06-02 NOTE — Progress Notes (Signed)
Please advise patient her insurance does not cover Combivent have sent new prescription for atrovent to her pharmacy.

## 2016-07-02 ENCOUNTER — Other Ambulatory Visit: Payer: Self-pay | Admitting: Family Medicine

## 2016-07-02 MED ORDER — IPRATROPIUM BROMIDE 0.03 % NA SOLN: 2.0000 | Freq: Every day | NASAL | 3 refills | Status: DC | PRN

## 2016-07-02 NOTE — Telephone Encounter (Signed)
[facility] [place] [facility] request on the following medications:  ipratropium (ATROVENT) 0.03 % nasal spray.  Inhale 2 puffs into the lungs every 6 hours as need for wheezing.  Quantity: 12.9  [facility] [place]

## 2016-07-12 ENCOUNTER — Other Ambulatory Visit: Payer: Self-pay | Admitting: Family Medicine

## 2016-07-12 DIAGNOSIS — F321 Major depressive disorder, single episode, moderate: Secondary | ICD-10-CM

## 2016-07-23 ENCOUNTER — Other Ambulatory Visit: Payer: Self-pay

## 2016-07-23 ENCOUNTER — Telehealth: Payer: Self-pay | Admitting: Family Medicine

## 2016-07-23 DIAGNOSIS — K219 Gastro-esophageal reflux disease without esophagitis: Secondary | ICD-10-CM

## 2016-07-23 NOTE — Telephone Encounter (Signed)
error 

## 2016-07-24 MED ORDER — ESTRADIOL 0.5 MG PO TABS: 0.5000 mg | ORAL_TABLET | Freq: Every day | ORAL | 5 refills | Status: DC

## 2016-07-24 MED ORDER — PANTOPRAZOLE SODIUM 40 MG PO TBEC: 40.0000 mg | DELAYED_RELEASE_TABLET | Freq: Two times a day (BID) | ORAL | 5 refills | Status: DC

## 2016-08-25 DIAGNOSIS — R0602 Shortness of breath: Secondary | ICD-10-CM | POA: Insufficient documentation

## 2016-09-13 ENCOUNTER — Emergency Department: Payer: Medicaid Other

## 2016-09-13 ENCOUNTER — Encounter: Payer: Self-pay | Admitting: Emergency Medicine

## 2016-09-13 ENCOUNTER — Emergency Department
Admission: EM | Admit: 2016-09-13 | Discharge: 2016-09-13 | Disposition: A | Discharge status: Home / Self Care | Payer: Medicaid Other | Attending: Emergency Medicine | Admitting: Emergency Medicine

## 2016-09-13 DIAGNOSIS — R3915 Urgency of urination: Secondary | ICD-10-CM | POA: Diagnosis not present

## 2016-09-13 DIAGNOSIS — R109 Unspecified abdominal pain: Secondary | ICD-10-CM | POA: Diagnosis not present

## 2016-09-13 DIAGNOSIS — R300 Dysuria: Secondary | ICD-10-CM | POA: Diagnosis present

## 2016-09-13 DIAGNOSIS — Z79899 Other long term (current) drug therapy: Secondary | ICD-10-CM | POA: Insufficient documentation

## 2016-09-13 DIAGNOSIS — R350 Frequency of micturition: Secondary | ICD-10-CM | POA: Insufficient documentation

## 2016-09-13 LAB — URINALYSIS, COMPLETE (UACMP) WITH MICROSCOPIC
Bacteria, UA: NONE SEEN
Bilirubin Urine: NEGATIVE
Glucose, UA: NEGATIVE mg/dL
Hgb urine dipstick: NEGATIVE
KETONES UR: NEGATIVE mg/dL
Leukocytes, UA: NEGATIVE
Nitrite: NEGATIVE
PH: 6 (ref 5.0–8.0)
Protein, ur: NEGATIVE mg/dL
RBC / HPF: NONE SEEN RBC/hpf (ref 0–5)
SPECIFIC GRAVITY, URINE: 1.002 — AB (ref 1.005–1.030)
WBC UA: NONE SEEN WBC/hpf (ref 0–5)

## 2016-09-13 LAB — COMPREHENSIVE METABOLIC PANEL
ALBUMIN: 4.3 g/dL (ref 3.5–5.0)
ALK PHOS: 53 U/L (ref 38–126)
ALT: 10 U/L — AB (ref 14–54)
AST: 19 U/L (ref 15–41)
Anion gap: 6 (ref 5–15)
BUN: 9 mg/dL (ref 6–20)
CALCIUM: 9.4 mg/dL (ref 8.9–10.3)
CO2: 29 mmol/L (ref 22–32)
CREATININE: 0.68 mg/dL (ref 0.44–1.00)
Chloride: 104 mmol/L (ref 101–111)
GFR calc non Af Amer: >60 mL/min (ref >60)
GLUCOSE: 100 mg/dL — AB (ref 65–99)
Potassium: 3.8 mmol/L (ref 3.5–5.1)
SODIUM: 139 mmol/L (ref 135–145)
Total Bilirubin: 1.8 mg/dL — ABNORMAL HIGH (ref 0.3–1.2)
Total Protein: 7.3 g/dL (ref 6.5–8.1)

## 2016-09-13 LAB — CBC WITH DIFFERENTIAL/PLATELET
BASOS ABS: 0 10*3/uL (ref 0–0.1)
Basophils Relative: 1 %
Eosinophils Absolute: 0 10*3/uL (ref 0–0.7)
Eosinophils Relative: 1 %
HCT: 37.3 % (ref 35.0–47.0)
HEMOGLOBIN: 12.8 g/dL (ref 12.0–16.0)
LYMPHS ABS: 1 10*3/uL (ref 1.0–3.6)
Lymphocytes Relative: 24 %
MCH: 26.7 pg (ref 26.0–34.0)
MCHC: 34.2 g/dL (ref 32.0–36.0)
MCV: 78.2 fL — ABNORMAL LOW (ref 80.0–100.0)
Monocytes Absolute: 0.4 10*3/uL (ref 0.2–0.9)
Monocytes Relative: 9 %
NEUTROS PCT: 65 %
Neutro Abs: 2.8 10*3/uL (ref 1.4–6.5)
Platelets: 148 10*3/uL — ABNORMAL LOW (ref 150–440)
RBC: 4.77 MIL/uL (ref 3.80–5.20)
RDW: 15 % — ABNORMAL HIGH (ref 11.5–14.5)
WBC: 4.3 10*3/uL (ref 3.6–11.0)

## 2016-09-13 LAB — LIPASE, BLOOD: Lipase: 29 U/L (ref 11–51)

## 2016-09-13 MED ORDER — ONDANSETRON 4 MG PO TBDP: 4.0000 mg | ORAL_TABLET | Freq: Four times a day (QID) | ORAL | 0 refills | Status: DC | PRN

## 2016-09-13 MED ORDER — LORAZEPAM 0.5 MG PO TABS
0.5000 mg | ORAL_TABLET | Freq: Once | ORAL | Status: AC
  Administered 2016-09-13: 0.5 mg via ORAL
  Filled 2016-09-13: qty 1

## 2016-09-13 MED ORDER — IOPAMIDOL (ISOVUE-300) INJECTION 61%
100.0000 mL | Freq: Once | INTRAVENOUS | Status: AC | PRN
  Administered 2016-09-13: 100 mL via INTRAVENOUS

## 2016-09-13 MED ORDER — IBUPROFEN 600 MG PO TABS
600.0000 mg | ORAL_TABLET | ORAL | Status: AC
  Administered 2016-09-13: 600 mg via ORAL
  Filled 2016-09-13: qty 1

## 2016-09-13 MED ORDER — ONDANSETRON HCL 4 MG/2ML IJ SOLN
4.0000 mg | Freq: Once | INTRAMUSCULAR | Status: AC
  Administered 2016-09-13: 4 mg via INTRAVENOUS
  Filled 2016-09-13: qty 2

## 2016-09-13 MED ORDER — IBUPROFEN 600 MG PO TABS
ORAL_TABLET | ORAL | Status: AC
  Filled 2016-09-13: qty 1

## 2016-09-13 NOTE — ED Notes (Signed)
Pt c/o 9/10 abd pain - Dr Jacqualine Code notified and states that d/t pt allergies there is nothing else the pt can have - pt notified
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
## 2016-09-13 NOTE — ED Triage Notes (Signed)
Pt states was up all night urinating with pain with urination and low abd pain. Per husband she was vomiting yesterday afternoon but not today.

## 2016-09-13 NOTE — Discharge Instructions (Signed)
You were seen in the emergency room for abdominal pain. It is important that you follow up closely with your primary care doctor in the next couple of days.  If you're unable to see her primary care doctor you may return to the emergency room or go to the [place] walk-in clinic in 1 or 2 days for reexam.  No driving tonight as you were given a sedating medication.  Please return to the emergency room right away if you are to develop a fever, severe nausea, your pain becomes severe or worsens, you are unable to keep food down, begin vomiting any dark or bloody fluid, you develop any dark or bloody stools, feel dehydrated, or other new concerns or symptoms arise.

## 2016-09-13 NOTE — ED Notes (Signed)
Pt reports that yesterday she started vomiting (x12) - last night pt reports that she started urinating frequently and having pressure/pain when she urinates - pt is having pain in lower left side that radiates into her back

## 2016-09-13 NOTE — ED Notes (Signed)
FIRST NURSE NOTE:  Pt arrived via POV with friend, pt walked into lobby guarding lower abdominal pain on the left

## 2016-09-13 NOTE — ED Notes (Signed)
Patient transported to CT 

## 2016-09-15 LAB — URINE CULTURE: SPECIAL REQUESTS: NORMAL

## 2016-09-16 NOTE — Discharge Planning (Signed)
8/14 positive culture from ED, <10,000 count in urine. Dr Jimmye Norman agrees pt can follow up with outpatient provider as stated in discharge notes. No further intervention needed at this time.  Erin Richards, PharmD, MBA, [facility]

## 2016-10-03 ENCOUNTER — Other Ambulatory Visit: Payer: Self-pay | Admitting: Family Medicine

## 2016-10-03 DIAGNOSIS — J302 Other seasonal allergic rhinitis: Secondary | ICD-10-CM

## 2016-10-07 ENCOUNTER — Encounter (INDEPENDENT_AMBULATORY_CARE_PROVIDER_SITE_OTHER): Payer: Self-pay

## 2016-10-19 ENCOUNTER — Other Ambulatory Visit: Payer: Self-pay | Admitting: Family Medicine

## 2016-10-19 DIAGNOSIS — J302 Other seasonal allergic rhinitis: Secondary | ICD-10-CM

## 2016-10-19 MED ORDER — FLUTICASONE PROPIONATE 50 MCG/ACT NA SUSP: 2.0000 | Freq: Every day | NASAL | 5 refills | Status: DC | PRN

## 2016-10-19 NOTE — Telephone Encounter (Signed)
[facility] faxed a request for the following medication.  Thanks CC ° °fluticasone (FLONASE) 50 MCG/ACT nasal spray  ° °

## 2016-10-20 ENCOUNTER — Encounter: Payer: Self-pay | Admitting: Family Medicine

## 2016-10-20 ENCOUNTER — Ambulatory Visit (INDEPENDENT_AMBULATORY_CARE_PROVIDER_SITE_OTHER): Payer: Medicaid Other | Admitting: Family Medicine

## 2016-10-20 VITALS — BP 100/54 | HR 84 | Temp 97.4°F | Resp 16 | Wt 130.0 lb

## 2016-10-20 DIAGNOSIS — J0121 Acute recurrent ethmoidal sinusitis: Secondary | ICD-10-CM | POA: Diagnosis not present

## 2016-10-20 DIAGNOSIS — J4540 Moderate persistent asthma, uncomplicated: Secondary | ICD-10-CM | POA: Diagnosis not present

## 2016-10-20 DIAGNOSIS — J301 Allergic rhinitis due to pollen: Secondary | ICD-10-CM

## 2016-10-20 DIAGNOSIS — J45909 Unspecified asthma, uncomplicated: Secondary | ICD-10-CM | POA: Insufficient documentation

## 2016-10-20 MED ORDER — AMOXICILLIN-POT CLAVULANATE 875-125 MG PO TABS: 1.0000 | ORAL_TABLET | Freq: Two times a day (BID) | ORAL | 0 refills | Status: DC

## 2016-10-20 MED ORDER — AZELASTINE HCL 0.1 % NA SOLN: 1.0000 | Freq: Two times a day (BID) | NASAL | 12 refills | Status: DC

## 2016-10-20 NOTE — Progress Notes (Signed)
Patient: Erin Richards Female    DOB: 10/20/1965   51 y.o.   MRN: [id number] Visit Date: 10/20/2016  Today's Provider: Lelon Huh, MD   Chief Complaint  Patient presents with  . Sinusitis   Subjective:    Patient has had sinus pressure for 5 days. Patient also has symptoms of nasal congestion, slight cough, and bilateral congested ears. Patient has been taking her usual allergy medications with only mild relief.    Sinusitis  This is a new problem. The current episode started in the past 7 days (5 days ). The problem has been gradually worsening since onset. There has been no fever. Her pain is at a severity of 5/10. The pain is moderate. Associated symptoms include congestion, coughing and sinus pressure. Pertinent negatives include no chills, diaphoresis, ear pain, headaches, hoarse voice, neck pain, shortness of breath, sneezing, sore throat or swollen glands. Treatments tried: allergiy medications. The treatment provided mild relief.   She is also hear to follow up on asthma. Her insurance did not cover Combivent and she requests prior authorizations. She states she uses inhaler episodically, typically 2-3 times a month. She states albuterol only provides about 30% improvement with her breathing and wears off in 1-2 hours whereas Combivent gets her back to near baseline and last 4-6 hours.     Allergies  Allergen Reactions  . Acetaminophen-Codeine Nausea And Vomiting  . Antiseptic Oral Rinse  [Cetylpyridinium Chloride] Other (See Comments)    UNK  . Aspartame Other (See Comments)    Reaction: unknown  . Biaxin [Clarithromycin] Nausea And Vomiting  . Carafate [Sucralfate]     Pt says her "Stomach hurts" when she takes it.    . Chlorhexidine Gluconate Nausea And Vomiting  . Clindamycin/Lincomycin Nausea And Vomiting  . Codeine Itching and Nausea And Vomiting  . Dextromethorphan Hbr Other (See Comments)    Reaction: unknown  . Dilaudid [Hydromorphone Hcl] Nausea And  Vomiting  . Doxycycline Nausea And Vomiting  . Fentanyl Nausea And Vomiting  . Germanium Other (See Comments)  . Hydrocodone Nausea And Vomiting  . Ketorolac Other (See Comments)  . Levofloxacin Other (See Comments)    GI upset. GI upset  . Mefenamic Acid Nausea And Vomiting  . Metformin And Related Nausea And Vomiting  . Metronidazole Diarrhea and Nausea And Vomiting  . Morphine And Related Nausea And Vomiting  . Moxifloxacin Swelling  . Nitrofurantoin Nausea And Vomiting and Other (See Comments)  . Nsaids Other (See Comments)    Reaction: unknown  . Oxycodone-Acetaminophen Nausea And Vomiting  . Periguard [Dimethicone] Nausea And Vomiting  . Phenothiazines Nausea And Vomiting  . Pioglitazone Nausea And Vomiting  . Quinidine Nausea And Vomiting  . Quinolones Nausea And Vomiting  . Rumex Crispus Other (See Comments)  . Tetracyclines & Related Nausea And Vomiting  . Toradol [Ketorolac Tromethamine] Nausea And Vomiting  . Tramadol Nausea And Vomiting  . Tussin [Guaifenesin] Nausea And Vomiting  . Tussionex Pennkinetic Er [Hydrocod Polst-Cpm Polst Er] Nausea And Vomiting  . Buprenorphine Hcl Nausea And Vomiting  . Lincomycin Hcl Nausea And Vomiting  . Oxycodone-Acetaminophen Hives and Nausea And Vomiting    Other reaction(s): Nausea And Vomiting, Unknown  . Phenylalanine Nausea And Vomiting     Current Outpatient Prescriptions:  .  albuterol (PROAIR HFA) 108 (90 Base) MCG/ACT inhaler, Inhale 1-2 puffs into the lungs every 6 (six) hours as needed for shortness of breath., Disp: 8.5 Inhaler, Rfl: 5 .  cetirizine (ZYRTEC) 10 MG tablet, Take 1 tablet (10 mg total) by mouth daily., Disp: 30 tablet, Rfl: 11 .  citalopram (CELEXA) 20 MG tablet, TAKE 0.5 TABLETS (10 MG TOTAL) BY MOUTH DAILY., Disp: 15 tablet, Rfl: 5 .  dicyclomine (BENTYL) 20 MG tablet, Take 20 mg by mouth every 4 (four) hours as needed for spasms., Disp: , Rfl:  .  estradiol (ESTRACE) 0.5 MG tablet, Take 1 tablet (0.5  mg total) by mouth daily., Disp: 30 tablet, Rfl: 5 .  fexofenadine (ALLEGRA) 60 MG tablet, Take 60 mg by mouth 2 (two) times daily., Disp: , Rfl:  .  fluticasone (FLONASE) 50 MCG/ACT nasal spray, Place 2 sprays into both nostrils daily as needed for allergies or rhinitis. Reported on 04/03/2015, Disp: 16 g, Rfl: 5 .  gabapentin (NEURONTIN) 300 MG capsule, TAKE 1 CAPSULE (300 MG TOTAL) BY MOUTH DAILY., Disp: 30 capsule, Rfl: 12 .  ipratropium (ATROVENT HFA) 17 MCG/ACT inhaler, Inhale 2 puffs into the lungs every 6 (six) hours as needed for wheezing., Disp: 1 Inhaler, Rfl: 5 .  ipratropium (ATROVENT) 0.03 % nasal spray, Place 2 sprays into the nose daily as needed., Disp: 30 mL, Rfl: 3 .  loperamide (IMODIUM A-D) 2 MG tablet, Take 1 tablet (2 mg total) by mouth 4 (four) times daily as needed for diarrhea or loose stools., Disp: 30 tablet, Rfl: 0 .  montelukast (SINGULAIR) 10 MG tablet, TAKE 1 TABLET (10 MG TOTAL) BY MOUTH AT BEDTIME., Disp: 30 tablet, Rfl: 12 .  nortriptyline (PAMELOR) 50 MG capsule, Take 1 capsule (50 mg total) by mouth daily as needed (headache)., Disp: 30 capsule, Rfl: 5 .  ondansetron (ZOFRAN ODT) 4 MG disintegrating tablet, Take 1 tablet (4 mg total) by mouth every 6 (six) hours as needed for nausea or vomiting., Disp: 20 tablet, Rfl: 0 .  pantoprazole (PROTONIX) 40 MG tablet, Take 1 tablet (40 mg total) by mouth 2 (two) times daily., Disp: 60 tablet, Rfl: 5 .  promethazine (PHENERGAN) 12.5 MG tablet, Take 1 tablet (12.5 mg total) by mouth every 6 (six) hours as needed for nausea or vomiting., Disp: 12 tablet, Rfl: 0  Review of Systems  Constitutional: Negative for appetite change, chills, diaphoresis, fatigue and fever.  HENT: Positive for congestion and sinus pressure. Negative for ear pain, hoarse voice, sneezing and sore throat.   Respiratory: Positive for cough. Negative for chest tightness and shortness of breath.   Cardiovascular: Negative for chest pain and palpitations.    Gastrointestinal: Negative for abdominal pain, nausea and vomiting.  Musculoskeletal: Negative for neck pain.  Neurological: Negative for dizziness, weakness and headaches.    Social History  Substance Use Topics  . Smoking status: Never Smoker  . Smokeless tobacco: Never Used  . Alcohol use No   Objective:   BP (!) 100/54 (BP Location: Right Arm, Patient Position: Sitting, Cuff Size: Normal)   Pulse 84   Temp (!) 97.4 F (36.3 C) (Oral)   Resp 16   Wt 130 lb (59 kg)   SpO2 97%   BMI 28.13 kg/m  Vitals:   10/20/16 1104  BP: (!) 100/54  Pulse: 84  Resp: 16  Temp: (!) 97.4 F (36.3 C)  TempSrc: Oral  SpO2: 97%  Weight: 130 lb (59 kg)     Physical Exam  General Appearance:    Alert, cooperative, no distress  HENT:   both TM normal without fluid or infection, neck without nodes, maxillary sinus tender and nasal mucosa pale and  congested  Eyes:    PERRL, conjunctiva/corneas clear, EOM's intact       Lungs:     Clear to auscultation bilaterally, respirations unlabored  Heart:    Regular rate and rhythm  Neurologic:   Awake, alert, oriented x 3. No apparent focal neurological           defect.           Assessment & Plan:     1. Acute recurrent ethmoidal sinusitis  - amoxicillin-clavulanate (AUGMENTIN) 875-125 MG tablet; Take 1 tablet by mouth 2 (two) times daily.  Dispense: 14 tablet; Refill: 0  2. Allergic rhinitis due to pollen, unspecified seasonality Add - azelastine (ASTELIN) 0.1 % nasal spray; Place 1 spray into both nostrils 2 (two) times daily.  Dispense: 30 mL; Refill: 12  3. Moderate persistent reactive airway disease without complication Completed prior authorization for Combivent Respimat  Call if symptoms change or if not rapidly improving.           Lelon Huh, MD  [facility]

## 2016-10-23 ENCOUNTER — Other Ambulatory Visit: Payer: Self-pay

## 2016-10-23 NOTE — Progress Notes (Signed)
Opened in error

## 2016-10-29 ENCOUNTER — Telehealth: Payer: Self-pay | Admitting: Family Medicine

## 2016-10-29 MED ORDER — FLUCONAZOLE 150 MG PO TABS: 150.0000 mg | ORAL_TABLET | Freq: Once | ORAL | 0 refills | Status: AC

## 2016-10-29 NOTE — Telephone Encounter (Signed)
Pt called saying the augmentin she was taking has caused her to have a yeast infection.  She needs a pill and cream rx for yeast.  [facility]  Pt's call back is [phone]

## 2016-10-29 NOTE — Telephone Encounter (Signed)
Please review. KW 

## 2016-11-05 ENCOUNTER — Encounter: Payer: Self-pay | Admitting: Emergency Medicine

## 2016-11-05 ENCOUNTER — Encounter: Payer: Self-pay | Admitting: Family Medicine

## 2016-11-05 ENCOUNTER — Ambulatory Visit (INDEPENDENT_AMBULATORY_CARE_PROVIDER_SITE_OTHER): Payer: Medicaid Other | Admitting: Family Medicine

## 2016-11-05 ENCOUNTER — Emergency Department: Payer: Medicaid Other

## 2016-11-05 ENCOUNTER — Emergency Department
Admission: EM | Admit: 2016-11-05 | Discharge: 2016-11-05 | Disposition: A | Discharge status: Home / Self Care | Payer: Medicaid Other | Attending: Emergency Medicine | Admitting: Emergency Medicine

## 2016-11-05 VITALS — BP 110/54 | HR 57 | Temp 97.4°F | Resp 16 | Wt 131.0 lb

## 2016-11-05 DIAGNOSIS — R197 Diarrhea, unspecified: Secondary | ICD-10-CM | POA: Insufficient documentation

## 2016-11-05 DIAGNOSIS — F329 Major depressive disorder, single episode, unspecified: Secondary | ICD-10-CM | POA: Diagnosis not present

## 2016-11-05 DIAGNOSIS — Z79899 Other long term (current) drug therapy: Secondary | ICD-10-CM | POA: Insufficient documentation

## 2016-11-05 DIAGNOSIS — R1032 Left lower quadrant pain: Secondary | ICD-10-CM | POA: Insufficient documentation

## 2016-11-05 DIAGNOSIS — R3129 Other microscopic hematuria: Secondary | ICD-10-CM

## 2016-11-05 LAB — URINALYSIS, COMPLETE (UACMP) WITH MICROSCOPIC
BILIRUBIN URINE: NEGATIVE
Bacteria, UA: NONE SEEN
GLUCOSE, UA: NEGATIVE mg/dL
Hgb urine dipstick: NEGATIVE
KETONES UR: NEGATIVE mg/dL
LEUKOCYTES UA: NEGATIVE
NITRITE: NEGATIVE
PH: 6 (ref 5.0–8.0)
Protein, ur: NEGATIVE mg/dL
Specific Gravity, Urine: 1.003 — ABNORMAL LOW (ref 1.005–1.030)

## 2016-11-05 LAB — POCT URINALYSIS DIPSTICK
Bilirubin, UA: NEGATIVE
GLUCOSE UA: NEGATIVE
KETONES UA: NEGATIVE
Leukocytes, UA: NEGATIVE
Nitrite, UA: NEGATIVE
Protein, UA: NEGATIVE
SPEC GRAV UA: 1.01 (ref 1.010–1.025)
Urobilinogen, UA: 0.2 E.U./dL
pH, UA: 6.5 (ref 5.0–8.0)

## 2016-11-05 LAB — COMPREHENSIVE METABOLIC PANEL
ALBUMIN: 4.2 g/dL (ref 3.5–5.0)
ALT: 11 U/L — AB (ref 14–54)
AST: 18 U/L (ref 15–41)
Alkaline Phosphatase: 53 U/L (ref 38–126)
Anion gap: 7 (ref 5–15)
BILIRUBIN TOTAL: 1.1 mg/dL (ref 0.3–1.2)
BUN: 10 mg/dL (ref 6–20)
CO2: 27 mmol/L (ref 22–32)
CREATININE: 0.7 mg/dL (ref 0.44–1.00)
Calcium: 9.8 mg/dL (ref 8.9–10.3)
Chloride: 105 mmol/L (ref 101–111)
GFR calc Af Amer: >60 mL/min (ref >60)
Glucose, Bld: 100 mg/dL — ABNORMAL HIGH (ref 65–99)
Potassium: 4.4 mmol/L (ref 3.5–5.1)
Sodium: 139 mmol/L (ref 135–145)
TOTAL PROTEIN: 7 g/dL (ref 6.5–8.1)

## 2016-11-05 LAB — CBC
HCT: 36.2 % (ref 35.0–47.0)
Hemoglobin: 12.5 g/dL (ref 12.0–16.0)
MCH: 28 pg (ref 26.0–34.0)
MCHC: 34.7 g/dL (ref 32.0–36.0)
MCV: 80.7 fL (ref 80.0–100.0)
PLATELETS: 152 10*3/uL (ref 150–440)
RBC: 4.48 MIL/uL (ref 3.80–5.20)
RDW: 14.9 % — AB (ref 11.5–14.5)
WBC: 3.5 10*3/uL — AB (ref 3.6–11.0)

## 2016-11-05 LAB — LIPASE, BLOOD: LIPASE: 25 U/L (ref 11–51)

## 2016-11-05 MED ORDER — ACETAMINOPHEN 500 MG PO TABS
1000.0000 mg | ORAL_TABLET | Freq: Once | ORAL | Status: AC
  Administered 2016-11-05: 1000 mg via ORAL
  Filled 2016-11-05: qty 2

## 2016-11-05 MED ORDER — IOPAMIDOL (ISOVUE-300) INJECTION 61%
100.0000 mL | Freq: Once | INTRAVENOUS | Status: AC | PRN
Start: 2016-11-05 — End: 2016-11-05
  Administered 2016-11-05: 100 mL via INTRAVENOUS

## 2016-11-05 MED ORDER — SULFAMETHOXAZOLE-TRIMETHOPRIM 800-160 MG PO TABS: 1.0000 | ORAL_TABLET | Freq: Two times a day (BID) | ORAL | 0 refills | Status: AC

## 2016-11-05 MED ORDER — SODIUM CHLORIDE 0.9 % IV BOLUS (SEPSIS)
1000.0000 mL | Freq: Once | INTRAVENOUS | Status: AC
  Administered 2016-11-05: 1000 mL via INTRAVENOUS

## 2016-11-05 MED ORDER — MORPHINE SULFATE (PF) 4 MG/ML IV SOLN
6.0000 mg | Freq: Once | INTRAVENOUS | Status: DC
Start: 2016-11-05 — End: 2016-11-05
  Filled 2016-11-05: qty 2

## 2016-11-05 MED ORDER — ONDANSETRON HCL 4 MG/2ML IJ SOLN
4.0000 mg | Freq: Once | INTRAMUSCULAR | Status: AC
  Administered 2016-11-05: 4 mg via INTRAVENOUS
  Filled 2016-11-05: qty 2

## 2016-11-05 NOTE — ED Notes (Signed)
Pt arrives from Dr. Sabino Snipes office for eval of LLQ abd pain with hematuria
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
## 2016-11-05 NOTE — ED Triage Notes (Signed)
Patient presents to ED via POV from PCP office. Patient here for sharp abdominal pain since yesterday. Patient reports nausea but denies vomiting of diarrhea.

## 2016-11-05 NOTE — Progress Notes (Signed)
Patient: Erin Richards Female    DOB: 10/30/1965   51 y.o.   MRN: [id number] Visit Date: 11/05/2016  Today's Provider: Lelon Huh, MD   Chief Complaint  Patient presents with  . Abdominal Pain   Subjective:    Patient started having pain in LLQ yesterday. Patient states that the pain is constant. Patient states that she has been having urinary frequency and frequent bowel movements since pain began. Patient states pain is severe. She has taken otc ibuprofen, tylenol, and tums with no relief.    Abdominal Pain  This is a new problem. The current episode started yesterday. The problem occurs constantly. The problem has been unchanged. The pain is located in the LLQ and left flank. The pain is at a severity of 9/10. The pain is severe. The quality of the pain is cramping and sharp. The abdominal pain radiates to the back. Associated symptoms include dysuria, frequency, myalgias and nausea. Pertinent negatives include no anorexia, arthralgias, belching, constipation, diarrhea, fever, flatus, headaches, hematochezia, hematuria, melena, vomiting or weight loss. The pain is aggravated by eating. The pain is relieved by being still. She has tried antacids and acetaminophen (ibuprofen, tums and tylenol) for the symptoms. The treatment provided no relief.   Past Medical History:  Diagnosis Date  . Allergy   . Clostridium difficile colitis 10/07/2014  . Depression   . History of Clostridium difficile colitis 09/24/2015  . IBS (irritable bowel syndrome)   . Residual ASD (atrial septal defect) following repair        Allergies  Allergen Reactions  . Acetaminophen-Codeine Nausea And Vomiting  . Antiseptic Oral Rinse  [Cetylpyridinium Chloride] Other (See Comments)    UNK  . Aspartame Other (See Comments)    Reaction: unknown  . Biaxin [Clarithromycin] Nausea And Vomiting  . Carafate [Sucralfate]     Pt says her "Stomach hurts" when she takes it.    . Chlorhexidine Gluconate Nausea  And Vomiting  . Clindamycin/Lincomycin Nausea And Vomiting  . Codeine Itching and Nausea And Vomiting  . Dextromethorphan Hbr Other (See Comments)    Reaction: unknown  . Dilaudid [Hydromorphone Hcl] Nausea And Vomiting  . Doxycycline Nausea And Vomiting  . Fentanyl Nausea And Vomiting  . Germanium Other (See Comments)  . Hydrocodone Nausea And Vomiting  . Ketorolac Other (See Comments)  . Levofloxacin Other (See Comments)    GI upset. GI upset  . Mefenamic Acid Nausea And Vomiting  . Metformin And Related Nausea And Vomiting  . Metronidazole Diarrhea and Nausea And Vomiting  . Morphine And Related Nausea And Vomiting  . Moxifloxacin Swelling  . Nitrofurantoin Nausea And Vomiting and Other (See Comments)  . Nsaids Other (See Comments)    Reaction: unknown  . Oxycodone-Acetaminophen Nausea And Vomiting  . Periguard [Dimethicone] Nausea And Vomiting  . Phenothiazines Nausea And Vomiting  . Pioglitazone Nausea And Vomiting  . Quinidine Nausea And Vomiting  . Quinolones Nausea And Vomiting  . Rumex Crispus Other (See Comments)  . Tetracyclines & Related Nausea And Vomiting  . Toradol [Ketorolac Tromethamine] Nausea And Vomiting  . Tramadol Nausea And Vomiting  . Tussin [Guaifenesin] Nausea And Vomiting  . Tussionex Pennkinetic Er [Hydrocod Polst-Cpm Polst Er] Nausea And Vomiting  . Buprenorphine Hcl Nausea And Vomiting  . Lincomycin Hcl Nausea And Vomiting  . Oxycodone-Acetaminophen Hives and Nausea And Vomiting    Other reaction(s): Nausea And Vomiting, Unknown  . Phenylalanine Nausea And Vomiting     Current  Outpatient Prescriptions:  .  albuterol (PROAIR HFA) 108 (90 Base) MCG/ACT inhaler, Inhale 1-2 puffs into the lungs every 6 (six) hours as needed for shortness of breath., Disp: 8.5 Inhaler, Rfl: 5 .  azelastine (ASTELIN) 0.1 % nasal spray, Place 1 spray into both nostrils 2 (two) times daily., Disp: 30 mL, Rfl: 12 .  cetirizine (ZYRTEC) 10 MG tablet, Take 1 tablet (10  mg total) by mouth daily., Disp: 30 tablet, Rfl: 11 .  citalopram (CELEXA) 20 MG tablet, TAKE 0.5 TABLETS (10 MG TOTAL) BY MOUTH DAILY., Disp: 15 tablet, Rfl: 5 .  dicyclomine (BENTYL) 20 MG tablet, Take 20 mg by mouth every 4 (four) hours as needed for spasms., Disp: , Rfl:  .  estradiol (ESTRACE) 0.5 MG tablet, Take 1 tablet (0.5 mg total) by mouth daily., Disp: 30 tablet, Rfl: 5 .  fexofenadine (ALLEGRA) 60 MG tablet, Take 60 mg by mouth 2 (two) times daily., Disp: , Rfl:  .  fluticasone (FLONASE) 50 MCG/ACT nasal spray, Place 2 sprays into both nostrils daily as needed for allergies or rhinitis. Reported on 04/03/2015, Disp: 16 g, Rfl: 5 .  gabapentin (NEURONTIN) 300 MG capsule, TAKE 1 CAPSULE (300 MG TOTAL) BY MOUTH DAILY., Disp: 30 capsule, Rfl: 12 .  ipratropium (ATROVENT HFA) 17 MCG/ACT inhaler, Inhale 2 puffs into the lungs every 6 (six) hours as needed for wheezing., Disp: 1 Inhaler, Rfl: 5 .  ipratropium (ATROVENT) 0.03 % nasal spray, Place 2 sprays into the nose daily as needed., Disp: 30 mL, Rfl: 3 .  loperamide (IMODIUM A-D) 2 MG tablet, Take 1 tablet (2 mg total) by mouth 4 (four) times daily as needed for diarrhea or loose stools., Disp: 30 tablet, Rfl: 0 .  montelukast (SINGULAIR) 10 MG tablet, TAKE 1 TABLET (10 MG TOTAL) BY MOUTH AT BEDTIME., Disp: 30 tablet, Rfl: 12 .  nortriptyline (PAMELOR) 50 MG capsule, Take 1 capsule (50 mg total) by mouth daily as needed (headache)., Disp: 30 capsule, Rfl: 5 .  ondansetron (ZOFRAN ODT) 4 MG disintegrating tablet, Take 1 tablet (4 mg total) by mouth every 6 (six) hours as needed for nausea or vomiting., Disp: 20 tablet, Rfl: 0 .  pantoprazole (PROTONIX) 40 MG tablet, Take 1 tablet (40 mg total) by mouth 2 (two) times daily., Disp: 60 tablet, Rfl: 5 .  promethazine (PHENERGAN) 12.5 MG tablet, Take 1 tablet (12.5 mg total) by mouth every 6 (six) hours as needed for nausea or vomiting., Disp: 12 tablet, Rfl: 0  Review of Systems  Constitutional:  Negative for appetite change, chills, fatigue, fever and weight loss.  Respiratory: Negative for chest tightness and shortness of breath.   Cardiovascular: Negative for chest pain and palpitations.  Gastrointestinal: Positive for abdominal pain and nausea. Negative for anorexia, blood in stool, constipation, diarrhea, flatus, hematochezia, melena and vomiting.  Genitourinary: Positive for dysuria and frequency. Negative for hematuria.  Musculoskeletal: Positive for myalgias. Negative for arthralgias.  Neurological: Negative for dizziness, weakness and headaches.    Social History  Substance Use Topics  . Smoking status: Never Smoker  . Smokeless tobacco: Never Used  . Alcohol use No   Objective:   BP (!) 110/54 (BP Location: Right Arm, Patient Position: Sitting, Cuff Size: Normal)   Pulse (!) 57   Temp (!) 97.4 F (36.3 C) (Oral)   Resp 16   Wt 131 lb (59.4 kg)   SpO2 98%   BMI 28.35 kg/m  Vitals:   11/05/16 0948  BP: (!) 110/54  Pulse: (!) 57  Resp: 16  Temp: (!) 97.4 F (36.3 C)  TempSrc: Oral  SpO2: 98%  Weight: 131 lb (59.4 kg)     Physical Exam  General Appearance:    Alert, cooperative, patient pacing floor in moderate distress, doubling over in pain.   Eyes:    PERRL, conjunctiva/corneas clear, EOM's intact       Lungs:     Clear to auscultation bilaterally, respirations unlabored  Heart:    Regular rate and rhythm  Abdomen:   soft, round or nondistended. Tenderness with rebound and guarding left lower quadrant. No CVA tenderness, mild CVA tenderness is present on the left.       Results for orders placed or performed in visit on 11/05/16  POCT urinalysis dipstick  Result Value Ref Range   Color, UA Yellow    Clarity, UA Clear    Glucose, UA Neg    Bilirubin, UA Neg    Ketones, UA Neg    Spec Grav, UA 1.010 1.010 - 1.025   Blood, UA Moderate Non-hemolyzed    pH, UA 6.5 5.0 - 8.0   Protein, UA Neg    Urobilinogen, UA 0.2 0.2 or 1.0 E.U./dL    Nitrite, UA Neg    Leukocytes, UA Negative Negative       Assessment & Plan:     1. Left lower quadrant pain  - POCT urinalysis dipstick  2. Other microscopic hematuria  Likely diagnsises are diverticulitis, colitis, or renal calculi. Discusses outpatient imaging and labs versus ER work up. Considering patients history of colitis requiring hospitalization and increasing level of pain in distress, she elected to proceed to ER for urgent evaluation and pain management.        Lelon Huh, MD  [facility]

## 2016-11-05 NOTE — Discharge Instructions (Signed)
Fortunately today your blood work, urine, and your CT scan were reassuring. Please take antibiotics for 5 days for possible infectious diarrhea and make sure you follow-up with your primary care physician tomorrow for recheck.  Return to the emergency department sooner for any concerns whatsoever.  It was a pleasure to take care of you today, and thank you for coming to our emergency department.  If you have any questions or concerns before leaving please ask the nurse to grab me and I'm more than happy to go through your aftercare instructions again.  If you were prescribed any opioid pain medication today such as Norco, Vicodin, Percocet, morphine, hydrocodone, or oxycodone please make sure you do not drive when you are taking this medication as it can alter your ability to drive safely.  If you have any concerns once you are home that you are not improving or are in fact getting worse before you can make it to your follow-up appointment, please do not hesitate to call 911 and come back for further evaluation.  Darel Hong, MD  Results for orders placed or performed during the hospital encounter of 11/05/16  Lipase, blood  Result Value Ref Range   Lipase 25 11 - 51 U/L  Comprehensive metabolic panel  Result Value Ref Range   Sodium 139 135 - 145 mmol/L   Potassium 4.4 3.5 - 5.1 mmol/L   Chloride 105 101 - 111 mmol/L   CO2 27 22 - 32 mmol/L   Glucose, Bld 100 (H) 65 - 99 mg/dL   BUN 10 6 - 20 mg/dL   Creatinine, Ser 0.70 0.44 - 1.00 mg/dL   Calcium 9.8 8.9 - 10.3 mg/dL   Total Protein 7.0 6.5 - 8.1 g/dL   Albumin 4.2 3.5 - 5.0 g/dL   AST 18 15 - 41 U/L   ALT 11 (L) 14 - 54 U/L   Alkaline Phosphatase 53 38 - 126 U/L   Total Bilirubin 1.1 0.3 - 1.2 mg/dL   GFR calc non Af Amer >60 >60 mL/min   GFR calc Af Amer >60 >60 mL/min   Anion gap 7 5 - 15  CBC  Result Value Ref Range   WBC 3.5 (L) 3.6 - 11.0 K/uL   RBC 4.48 3.80 - 5.20 MIL/uL   Hemoglobin 12.5 12.0 - 16.0 g/dL   HCT  36.2 35.0 - 47.0 %   MCV 80.7 80.0 - 100.0 fL   MCH 28.0 26.0 - 34.0 pg   MCHC 34.7 32.0 - 36.0 g/dL   RDW 14.9 (H) 11.5 - 14.5 %   Platelets 152 150 - 440 K/uL  Urinalysis, Complete w Microscopic  Result Value Ref Range   Color, Urine STRAW (A) YELLOW   APPearance CLEAR (A) CLEAR   Specific Gravity, Urine 1.003 (L) 1.005 - 1.030   pH 6.0 5.0 - 8.0   Glucose, UA NEGATIVE NEGATIVE mg/dL   Hgb urine dipstick NEGATIVE NEGATIVE   Bilirubin Urine NEGATIVE NEGATIVE   Ketones, ur NEGATIVE NEGATIVE mg/dL   Protein, ur NEGATIVE NEGATIVE mg/dL   Nitrite NEGATIVE NEGATIVE   Leukocytes, UA NEGATIVE NEGATIVE   RBC / HPF 0-5 0 - 5 RBC/hpf   WBC, UA 0-5 0 - 5 WBC/hpf   Bacteria, UA NONE SEEN NONE SEEN   Squamous Epithelial / LPF 0-5 (A) NONE SEEN   Ct Abdomen Pelvis W Contrast  Result Date: 11/05/2016 CLINICAL DATA:  Abdominal pain, primarily left flank and left lower quadrant. Dysuria. Nausea. EXAM: CT ABDOMEN AND  PELVIS WITH CONTRAST TECHNIQUE: Multidetector CT imaging of the abdomen and pelvis was performed using the standard protocol following bolus administration of intravenous contrast. CONTRAST:  153mL ISOVUE-300 IOPAMIDOL (ISOVUE-300) INJECTION 61% COMPARISON:  September 13, 2016 FINDINGS: Lower chest: There is no lung base edema or consolidation. There are foci of coronary artery calcification. Hepatobiliary: There is evidence of a degree of hepatic steatosis. A small apparent hemangioma seen in the right lobe of the liver on recent prior study is not demonstrable on current examination. No focal liver lesion is evident on this current examination. Gallbladder wall does not appear appreciably thickened. There is no biliary duct dilatation. Pancreas: There is no pancreatic mass or inflammatory focus. Spleen: Spleen measures 12.3 x 11.4 x 7.8 cm with a measured splenic volume of 547 cubic cm. No focal splenic lesions are evident. Adrenals/Urinary Tract: Adrenals appear normal bilaterally. Kidneys  bilaterally show no evident mass or hydronephrosis on either side. There is no appreciable renal or ureteral calculus on either side. Urinary bladder is midline with wall thickness within normal limits. Stomach/Bowel: There is no appreciable bowel wall or mesenteric thickening. There is no evident bowel obstruction. No free air or portal venous air. Vascular/Lymphatic: There is no abdominal aortic aneurysm. No vascular lesions are evident. There is no appreciable adenopathy in the abdomen or pelvis. Reproductive: Uterus is absent.  There is no evident pelvic mass. Other: Appendix appears normal. No abscess or ascites is evident in the abdomen or pelvis. Musculoskeletal: There are no blastic or lytic bone lesions. There is no intramuscular or abdominal wall lesion evident. IMPRESSION: 1. No bowel wall thickening or bowel obstruction. No diverticulitis. 2.  Prominent spleen of uncertain etiology. 3. There is evidence a degree of hepatic steatosis. No focal liver lesions are appreciable currently. A small hemangioma noted in the right lobe of the liver on recent prior study is not demonstrable currently. 4.  No abscess.  Appendix appears normal. 5. No renal or ureteral calculus. No hydronephrosis. Urinary bladder wall is not appreciably thickened. 6.  Uterus absent. Electronically Signed   By: Lowella Grip III M.D.   On: 11/05/2016 13:51

## 2016-12-10 ENCOUNTER — Encounter: Payer: Self-pay | Admitting: Family Medicine

## 2016-12-10 ENCOUNTER — Ambulatory Visit: Payer: Medicaid Other | Admitting: Family Medicine

## 2016-12-10 VITALS — BP 90/52 | HR 59 | Temp 97.9°F | Resp 16 | Wt 130.0 lb

## 2016-12-10 DIAGNOSIS — M7741 Metatarsalgia, right foot: Secondary | ICD-10-CM

## 2016-12-10 DIAGNOSIS — M7742 Metatarsalgia, left foot: Secondary | ICD-10-CM

## 2016-12-10 DIAGNOSIS — J4540 Moderate persistent asthma, uncomplicated: Secondary | ICD-10-CM

## 2016-12-10 DIAGNOSIS — L570 Actinic keratosis: Secondary | ICD-10-CM | POA: Diagnosis not present

## 2016-12-10 DIAGNOSIS — J40 Bronchitis, not specified as acute or chronic: Secondary | ICD-10-CM

## 2016-12-10 MED ORDER — AZITHROMYCIN 250 MG PO TABS: ORAL_TABLET | ORAL | 0 refills | Status: AC

## 2016-12-10 MED ORDER — CELECOXIB 200 MG PO CAPS: 200.0000 mg | ORAL_CAPSULE | Freq: Two times a day (BID) | ORAL | 0 refills | Status: DC

## 2016-12-10 NOTE — Patient Instructions (Signed)
   Start wearing gel foam inserts for both feet   Call for referral to podiatry if not greatly improved in a week.

## 2016-12-10 NOTE — Progress Notes (Signed)
Patient: Erin Richards Female    DOB: February 12, 1965   51 y.o.   MRN: [id number] Visit Date: 12/10/2016  Today's Provider: Lelon Huh, MD   Chief Complaint  Patient presents with  . Foot Pain    x 2 months   Subjective:    Foot Pain  This is a new problem. Episode onset: 2 months ago. The problem occurs constantly. The problem has been gradually worsening. Associated symptoms include coughing (productive with green sputum x 1 week) and myalgias (pain in both feet). Pertinent negatives include no abdominal pain, chest pain, chills, diaphoresis, fatigue, fever, nausea, vomiting or weakness. The symptoms are aggravated by walking. Treatments tried: foot massage with lotions and supportive shoes. The treatment provided no relief.  Patient states that at the end of the day when she takes her shoes off, her feet are red. Patient reports that it feels like she is walking on pebbles all day. Patient denies any swelling. She states can take meloxicam. Pain is worse when standing, especially when she first stands after being off of feet for prolonged period. Affects both feet but left more than right.   She also complains of one week of chest and coughing described in HPI. No dyspnea. No fevers or chills. Has had a little bit of sinus congestion and drainage. Is using Combivent inhaler which provides moderat relief.    She also complains of sore on top of her left ear for 3-4 months. It will scab up and fall off, but sore keeps returning to same spot.     Allergies  Allergen Reactions  . Acetaminophen-Codeine Nausea And Vomiting  . Antiseptic Oral Rinse  [Cetylpyridinium Chloride] Other (See Comments)    UNK  . Aspartame Other (See Comments)    Reaction: unknown  . Biaxin [Clarithromycin] Nausea And Vomiting  . Carafate [Sucralfate]     Pt says her "Stomach hurts" when she takes it.    . Chlorhexidine Gluconate Nausea And Vomiting  . Clindamycin/Lincomycin Nausea And Vomiting  .  Codeine Itching and Nausea And Vomiting  . Dextromethorphan Hbr Other (See Comments)    Reaction: unknown  . Dilaudid [Hydromorphone Hcl] Nausea And Vomiting  . Doxycycline Nausea And Vomiting  . Fentanyl Nausea And Vomiting  . Germanium Other (See Comments)  . Hydrocodone Nausea And Vomiting  . Ketorolac Other (See Comments)  . Levofloxacin Other (See Comments)    GI upset. GI upset  . Mefenamic Acid Nausea And Vomiting  . Metformin And Related Nausea And Vomiting  . Metronidazole Diarrhea and Nausea And Vomiting  . Morphine And Related Nausea And Vomiting  . Moxifloxacin Swelling  . Nitrofurantoin Nausea And Vomiting and Other (See Comments)  . Nsaids Other (See Comments)    Reaction: unknown  . Oxycodone-Acetaminophen Nausea And Vomiting  . Periguard [Dimethicone] Nausea And Vomiting  . Phenothiazines Nausea And Vomiting  . Pioglitazone Nausea And Vomiting  . Quinidine Nausea And Vomiting  . Quinolones Nausea And Vomiting  . Rumex Crispus Other (See Comments)  . Tetracyclines & Related Nausea And Vomiting  . Toradol [Ketorolac Tromethamine] Nausea And Vomiting  . Tramadol Nausea And Vomiting  . Tussin [Guaifenesin] Nausea And Vomiting  . Tussionex Pennkinetic Er [Hydrocod Polst-Cpm Polst Er] Nausea And Vomiting  . Buprenorphine Hcl Nausea And Vomiting  . Lincomycin Hcl Nausea And Vomiting  . Oxycodone-Acetaminophen Hives and Nausea And Vomiting    Other reaction(s): Nausea And Vomiting, Unknown  . Phenylalanine Nausea And Vomiting  Current Outpatient Medications:  .  albuterol (PROAIR HFA) 108 (90 Base) MCG/ACT inhaler, Inhale 1-2 puffs into the lungs every 6 (six) hours as needed for shortness of breath., Disp: 8.5 Inhaler, Rfl: 5 .  azelastine (ASTELIN) 0.1 % nasal spray, Place 1 spray into both nostrils 2 (two) times daily., Disp: 30 mL, Rfl: 12 .  cetirizine (ZYRTEC) 10 MG tablet, Take 1 tablet (10 mg total) by mouth daily., Disp: 30 tablet, Rfl: 11 .  citalopram  (CELEXA) 20 MG tablet, TAKE 0.5 TABLETS (10 MG TOTAL) BY MOUTH DAILY., Disp: 15 tablet, Rfl: 5 .  COMBIVENT RESPIMAT 20-100 MCG/ACT AERS respimat, Inhale 1 puff into the lungs every 4 (four) hours as needed for wheezing., Disp: , Rfl: 2 .  estradiol (ESTRACE) 0.5 MG tablet, Take 1 tablet (0.5 mg total) by mouth daily., Disp: 30 tablet, Rfl: 5 .  fexofenadine (ALLEGRA) 60 MG tablet, Take 60 mg by mouth 2 (two) times daily., Disp: , Rfl:  .  fluticasone (FLONASE) 50 MCG/ACT nasal spray, Place 2 sprays into both nostrils daily as needed for allergies or rhinitis. Reported on 04/03/2015, Disp: 16 g, Rfl: 5 .  gabapentin (NEURONTIN) 300 MG capsule, TAKE 1 CAPSULE (300 MG TOTAL) BY MOUTH DAILY., Disp: 30 capsule, Rfl: 12 .  ipratropium (ATROVENT HFA) 17 MCG/ACT inhaler, Inhale 2 puffs into the lungs every 6 (six) hours as needed for wheezing., Disp: 1 Inhaler, Rfl: 5 .  ipratropium (ATROVENT) 0.03 % nasal spray, Place 2 sprays into the nose daily as needed., Disp: 30 mL, Rfl: 3 .  loperamide (IMODIUM A-D) 2 MG tablet, Take 1 tablet (2 mg total) by mouth 4 (four) times daily as needed for diarrhea or loose stools., Disp: 30 tablet, Rfl: 0 .  montelukast (SINGULAIR) 10 MG tablet, TAKE 1 TABLET (10 MG TOTAL) BY MOUTH AT BEDTIME., Disp: 30 tablet, Rfl: 12 .  nortriptyline (PAMELOR) 50 MG capsule, Take 1 capsule (50 mg total) by mouth daily as needed (headache)., Disp: 30 capsule, Rfl: 5 .  ondansetron (ZOFRAN ODT) 4 MG disintegrating tablet, Take 1 tablet (4 mg total) by mouth every 6 (six) hours as needed for nausea or vomiting., Disp: 20 tablet, Rfl: 0 .  pantoprazole (PROTONIX) 40 MG tablet, Take 1 tablet (40 mg total) by mouth 2 (two) times daily., Disp: 60 tablet, Rfl: 5 .  promethazine (PHENERGAN) 12.5 MG tablet, Take 1 tablet (12.5 mg total) by mouth every 6 (six) hours as needed for nausea or vomiting., Disp: 12 tablet, Rfl: 0  Review of Systems  Constitutional: Negative for appetite change, chills,  diaphoresis, fatigue and fever.  HENT: Positive for rhinorrhea, sinus pressure and sinus pain.   Respiratory: Positive for cough (productive with green sputum x 1 week). Negative for chest tightness and shortness of breath.   Cardiovascular: Negative for chest pain and palpitations.  Gastrointestinal: Negative for abdominal pain, nausea and vomiting.  Musculoskeletal: Positive for myalgias (pain in both feet).  Skin:       Scab on left ear that wont heal  Neurological: Negative for dizziness and weakness.    Social History   Tobacco Use  . Smoking status: Never Smoker  . Smokeless tobacco: Never Used  Substance Use Topics  . Alcohol use: No   Objective:   BP (!) 90/52 (BP Location: Left Arm, Patient Position: Sitting, Cuff Size: Normal)   Pulse (!) 59   Temp 97.9 F (36.6 C) (Oral)   Resp 16   Wt 130 lb (59 kg)  SpO2 98% Comment: room air  BMI 28.13 kg/m  Vitals:   12/10/16 0820  BP: (!) 90/52  Pulse: (!) 59  Resp: 16  Temp: 97.9 F (36.6 C)  TempSrc: Oral  SpO2: 98%  Weight: 130 lb (59 kg)     Physical Exam  General Appearance:    Alert, cooperative, no distress  HENT:   ENT exam normal, no neck nodes or sinus tenderness  Eyes:    PERRL, conjunctiva/corneas clear, EOM's intact       Lungs:     Clear to auscultation bilaterally, respirations unlabored, occasional expiratory wheezing.   Heart:    Regular rate and rhythm  MS:   Tender both metatarsal heads and heels. No swelling or erythema. Cap refill <2 seconds. +2 pedal pusles.   Skin:  about 73mm scabbed lesion top of left earlobe c/w AK.        Assessment & Plan:     1. Metatarsalgia of both feet Try NSAID and gel foam inserts. Consider podiatry referral if not improving within a week.  - celecoxib (CELEBREX) 200 MG capsule; Take 1 capsule (200 mg total) 2 (two) times daily by mouth.  Dispense: 60 capsule; Refill: 0  2. Bronchitis  - azithromycin (ZITHROMAX) 250 MG tablet; 2 by mouth today, then 1  daily for 4 days  Dispense: 6 tablet; Refill: 0  3. Actinic keratosis  - Ambulatory referral to Dermatology  4. Moderate persistent reactive airway disease without complication Continue current inhalers.        Lelon Huh, MD  [facility]

## 2016-12-11 ENCOUNTER — Telehealth: Payer: Self-pay

## 2016-12-11 DIAGNOSIS — M7742 Metatarsalgia, left foot: Principal | ICD-10-CM

## 2016-12-11 DIAGNOSIS — M7741 Metatarsalgia, right foot: Secondary | ICD-10-CM

## 2016-12-11 NOTE — Telephone Encounter (Signed)
Patient states she will not be able to take Celebrex. Patient wants to go ahead with podiatry referral.

## 2016-12-11 NOTE — Telephone Encounter (Signed)
Patient called stating she was started on Celebrex yesterday. She took the first dose last night and the second dose this morning. Patient states since taking these 2 doses, she has had sever stomach cramps and diarrhea. Patient denies any fever, nausea or vomiting. She thinks the Celebrex may have caused the diarrhea and stomach cramps. Patient is still having pain in her feet. She has tried using the gel foam inserts with no relief. Patient wants to know what she needs to do to help relieve the stomach pain and diarrhea as well as the pain in her feet.

## 2016-12-11 NOTE — Telephone Encounter (Signed)
Please schedule referral to podiatry. Thanks!

## 2016-12-11 NOTE — Telephone Encounter (Signed)
She can take OTC ranitidine (Zantac) to help her stomach. Is she is not able to tolerate the Celebrex then she will need referral to podiatry.

## 2016-12-14 NOTE — Telephone Encounter (Signed)
Patient called again wanting to know the status of her referral. Please contact when this is scheduled. Thanks!

## 2016-12-14 NOTE — Telephone Encounter (Signed)
Pt advised of appointment with Dr Amalia Hailey 12/22/16 at 10:00

## 2016-12-15 ENCOUNTER — Telehealth: Payer: Self-pay | Admitting: Family Medicine

## 2016-12-15 DIAGNOSIS — J0121 Acute recurrent ethmoidal sinusitis: Secondary | ICD-10-CM

## 2016-12-15 MED ORDER — AMOXICILLIN-POT CLAVULANATE 875-125 MG PO TABS: 1.0000 | ORAL_TABLET | Freq: Two times a day (BID) | ORAL | 0 refills | Status: AC

## 2016-12-15 NOTE — Telephone Encounter (Signed)
Please review. Thanks!  

## 2016-12-15 NOTE — Telephone Encounter (Signed)
Pt states she is still coughing and has a lot of congestion.  She has green from her nose.  Pt is asking if she can get another Rx.  [facility].  XJ#[phone]/[id number]

## 2016-12-22 ENCOUNTER — Ambulatory Visit: Payer: Self-pay | Admitting: Podiatry

## 2016-12-28 ENCOUNTER — Other Ambulatory Visit: Payer: Self-pay

## 2016-12-29 ENCOUNTER — Ambulatory Visit: Payer: Medicaid Other | Admitting: Podiatry

## 2016-12-29 ENCOUNTER — Ambulatory Visit: Payer: Medicaid Other

## 2016-12-29 ENCOUNTER — Encounter: Payer: Self-pay | Admitting: Podiatry

## 2016-12-29 DIAGNOSIS — M2580 Other specified joint disorders, unspecified joint: Secondary | ICD-10-CM | POA: Diagnosis not present

## 2016-12-29 NOTE — Progress Notes (Signed)
   Subjective:    Patient ID: Erin Richards, female    DOB: 1965-04-07, 51 y.o.   MRN: [id number]  HPI    Review of Systems     Objective:   Physical Exam        Assessment & Plan:

## 2016-12-31 NOTE — Progress Notes (Signed)
   HPI: 51 year old female presenting today as a new patient with a complaint of constant, throbbing, aching pain to bilateral feet and heels, left worse than right, that began gradually about three months ago. She states it feels as if she is walking on pebbles. She states the pain is primarily located to the plantar aspect of the heels, forefoot and toes. She reports associated redness of the toes. Walking and standing increase the pain. She has been applying heat and ice therapy, taking Tylenol, Celebrex and Ibuprofen with no significant relief. Patient is here for further evaluation and treatment.   Past Medical History:  Diagnosis Date  . Allergy   . Clostridium difficile colitis 10/07/2014  . Depression   . History of Clostridium difficile colitis 09/24/2015  . IBS (irritable bowel syndrome)   . 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  Dermatology: Skin is warm, dry and supple bilateral lower extremities. Negative for open lesions or macerations.  Vascular: Palpable pedal pulses bilaterally. No edema or erythema noted. Capillary refill within normal limits.  Neurological: Epicritic and protective threshold grossly intact bilaterally.   Musculoskeletal Exam: Pain with palpation to the bilateral sesamoid apparatus. Range of motion within normal limits to all pedal and ankle joints bilateral. Muscle strength 5/5 in all groups bilateral.   Radiographic Exam:  Normal osseous mineralization. Joint spaces preserved. No fracture/dislocation/boney destruction.    Assessment: - sesamoiditis bilaterally   Plan of Care:  - Patient evaluated. X-Rays reviewed. - Injection of 0.5 mLs of Celestone Soluspan injected into the sesamoidal apparatus. - recommended OTC insoles. Instructed patient to not walk around barefoot. - Return to clinic when necessary.   Edrick Kins, DPM [facility]  Dr. Edrick Kins, DPM    [address], [place] [address]                Office [phone]  Fax [phone]

## 2017-01-01 ENCOUNTER — Ambulatory Visit: Payer: Self-pay | Admitting: Podiatry

## 2017-01-04 ENCOUNTER — Telehealth: Payer: Self-pay | Admitting: Family Medicine

## 2017-01-04 ENCOUNTER — Telehealth: Payer: Medicaid Other | Admitting: Nurse Practitioner

## 2017-01-04 DIAGNOSIS — R109 Unspecified abdominal pain: Secondary | ICD-10-CM

## 2017-01-04 DIAGNOSIS — R6883 Chills (without fever): Secondary | ICD-10-CM

## 2017-01-04 DIAGNOSIS — R079 Chest pain, unspecified: Secondary | ICD-10-CM

## 2017-01-04 NOTE — Progress Notes (Signed)
Based on what you shared with me (Due to stating you had constant chest pain and/or abdominal pain ) it looks like you have a serious condition that should be evaluated in a face to face office visit.  NOTE: Even if you have entered your credit card information for this eVisit, you will not be charged.   If you are having a true medical emergency please call 911.  If you need an urgent face to face visit, [place] has four urgent care centers for your convenience.  If you need care fast and have a high deductible or no insurance consider:   [email or web address]  [phone]  [address] [place], [place] [address] 8 am to 8 pm Monday-Friday 10 am to 4 pm Saturday-Sunday   The following sites will take your  insurance:    . [facility] [place] a Provider at this Location  [address] [place], [place] [address] . 10 am to 8 pm Monday-Friday . 12 pm to 8 pm Saturday-Sunday   . [facility] [place] a Provider at this Location  [address], [place], [place] [address] . 8 am to 8 pm Monday-Friday . 9 am to 6 pm Saturday . 11 am to 6 pm Sunday   . [facility] [place] Get Driving Directions  [address], [place] [address] . 8 am to 8 pm Monday-Friday . 8 am to 4 pm Saturday-Sunday   Your e-visit answers were reviewed by a board certified advanced clinical practitioner to complete your personal care plan.  Thank you for using e-Visits.

## 2017-01-04 NOTE — Telephone Encounter (Signed)
Pt called saying she would like a nurse to call her back.  Her symptoms are stomach ache, diarrhea and chills.  Pt does not want to come in  Her call back is [phone]  Thanks Con Memos

## 2017-01-04 NOTE — Telephone Encounter (Signed)
Left message to call back  

## 2017-01-04 NOTE — Telephone Encounter (Signed)
Pt is returning call.  [id number]#[phone]/LM

## 2017-01-05 ENCOUNTER — Other Ambulatory Visit: Payer: Self-pay | Admitting: Family Medicine

## 2017-01-05 DIAGNOSIS — M7742 Metatarsalgia, left foot: Principal | ICD-10-CM

## 2017-01-05 DIAGNOSIS — M7741 Metatarsalgia, right foot: Secondary | ICD-10-CM

## 2017-01-05 DIAGNOSIS — F321 Major depressive disorder, single episode, moderate: Secondary | ICD-10-CM

## 2017-01-05 NOTE — Telephone Encounter (Signed)
Patient reports that she has felt achy, had chills, diarrhea, and nausea for the last 2 days. She has felt feverish, but has not taken her temperature. She has taken Tylenol, Advil, and Imodium for her symptoms with mild relief. She reports that she was diagnosed with the flu the last time she had these symptoms. She does not want to come into the office because she feels so bad. Patient is requesting that we send in Tamiflu into the pharmacy[facility] [place] Contact info is correct. Thanks!

## 2017-01-05 NOTE — Telephone Encounter (Signed)
Pharmacy requesting refills. Thanks!  

## 2017-01-06 ENCOUNTER — Telehealth: Payer: Self-pay

## 2017-01-06 ENCOUNTER — Other Ambulatory Visit: Payer: Self-pay | Admitting: Family Medicine

## 2017-01-06 MED ORDER — FEXOFENADINE HCL 60 MG PO TABS: 60.0000 mg | ORAL_TABLET | Freq: Two times a day (BID) | ORAL | 4 refills | Status: DC

## 2017-01-06 NOTE — Telephone Encounter (Signed)
Pt contacted office for refill request on the following medications:  fexofenadine (ALLEGRA) 60 MG tablet  [facility]  Pt is requesting Dr. Caryn Section take over the Rx for the medication b/c pt no longer sees the provider that was giving her the Rx. Pt stated she was seeing Dr. Virgia Land but isn't currently seeing him. Please advise. Thanks TNP

## 2017-01-06 NOTE — Telephone Encounter (Signed)
Was speaking with patient and got disconnected. Tried to call back no answer. Left patient a voicemail to call the office for a appointment if still needed.

## 2017-01-06 NOTE — Telephone Encounter (Signed)
Patient advised per Dr. Caryn Section she would need a OV for symptoms she reported on 12/3 (telephone encounter). Patient verbalized understanding and states she feels better now. She thinks it was a GI bug.  She states she now has upper respiratory symptoms possibly sinusitis and requested a antibiotic. Advised patient that she would need a OV for new symptoms also.

## 2017-01-06 NOTE — Telephone Encounter (Signed)
Needs to be seen. Doesn't sound like influenza.

## 2017-01-07 NOTE — Telephone Encounter (Signed)
Already done

## 2017-01-31 ENCOUNTER — Other Ambulatory Visit: Payer: Self-pay | Admitting: Family Medicine

## 2017-01-31 DIAGNOSIS — K219 Gastro-esophageal reflux disease without esophagitis: Secondary | ICD-10-CM

## 2017-02-03 ENCOUNTER — Other Ambulatory Visit: Payer: Self-pay | Admitting: Family Medicine

## 2017-02-03 NOTE — Telephone Encounter (Signed)
Pharmacy requesting refills. Thanks!  

## 2017-02-26 ENCOUNTER — Other Ambulatory Visit: Payer: Self-pay | Admitting: Family Medicine

## 2017-03-30 ENCOUNTER — Telehealth: Payer: Self-pay | Admitting: Family Medicine

## 2017-03-30 NOTE — Telephone Encounter (Signed)
Emon from [facility] about a new medication for patient. Please return Emon's call @ [phone].  Thanks CC.

## 2017-03-31 NOTE — Telephone Encounter (Addendum)
[facility] stated that sent a fax concerning pa's medications. Have you seen this fax?

## 2017-03-31 NOTE — Telephone Encounter (Signed)
What medication

## 2017-04-02 NOTE — Telephone Encounter (Signed)
Called [facility] back to have form refaxed.

## 2017-04-23 ENCOUNTER — Other Ambulatory Visit: Payer: Self-pay | Admitting: Family Medicine

## 2017-04-23 MED ORDER — ESTRADIOL 0.5 MG PO TABS: 0.5000 mg | ORAL_TABLET | Freq: Every day | ORAL | 5 refills | Status: DC

## 2017-04-23 NOTE — Telephone Encounter (Signed)
[facility]. Thanks CC  estradiol (ESTRACE) 0.5 MG tablet

## 2017-04-28 ENCOUNTER — Ambulatory Visit: Payer: Medicaid Other | Admitting: Family Medicine

## 2017-04-28 ENCOUNTER — Encounter: Payer: Self-pay | Admitting: Family Medicine

## 2017-04-28 VITALS — BP 104/62 | HR 66 | Temp 97.8°F | Resp 16 | Wt 123.0 lb

## 2017-04-28 DIAGNOSIS — R05 Cough: Secondary | ICD-10-CM | POA: Diagnosis not present

## 2017-04-28 DIAGNOSIS — R350 Frequency of micturition: Secondary | ICD-10-CM | POA: Diagnosis not present

## 2017-04-28 DIAGNOSIS — J40 Bronchitis, not specified as acute or chronic: Secondary | ICD-10-CM | POA: Diagnosis not present

## 2017-04-28 DIAGNOSIS — R059 Cough, unspecified: Secondary | ICD-10-CM

## 2017-04-28 LAB — POCT URINALYSIS DIPSTICK
Bilirubin, UA: NEGATIVE
GLUCOSE UA: NEGATIVE
KETONES UA: NEGATIVE
LEUKOCYTES UA: NEGATIVE
Nitrite, UA: NEGATIVE
PH UA: 6.5 (ref 5.0–8.0)
Protein, UA: NEGATIVE
Spec Grav, UA: 1.01 (ref 1.010–1.025)
Urobilinogen, UA: 0.2 E.U./dL

## 2017-04-28 MED ORDER — FLUCONAZOLE 150 MG PO TABS: 150.0000 mg | ORAL_TABLET | Freq: Once | ORAL | 0 refills | Status: AC

## 2017-04-28 MED ORDER — AMOXICILLIN-POT CLAVULANATE 875-125 MG PO TABS: 1.0000 | ORAL_TABLET | Freq: Two times a day (BID) | ORAL | 0 refills | Status: AC

## 2017-04-28 NOTE — Progress Notes (Signed)
Patient: Erin Richards Female    DOB: Jan 31, 1966   52 y.o.   MRN: [id number] Visit Date: 04/28/2017  Today's Provider: Lelon Huh, MD   Chief Complaint  Patient presents with  . URI  . Urinary Frequency   Subjective:    URI   This is a new problem. The current episode started in the past 7 days. The problem has been gradually worsening. There has been no fever. Associated symptoms include congestion, coughing, headaches, nausea and a sore throat. She has tried decongestant (sudafed) for the symptoms. The treatment provided mild relief.  Urinary Frequency   This is a new problem. The current episode started in the past 7 days. The problem has been gradually worsening. The quality of the pain is described as burning. The patient is experiencing no pain. There has been no fever. Associated symptoms include chills, frequency and nausea. Pertinent negatives include no hematuria. She has tried nothing for the symptoms.       Allergies  Allergen Reactions  . Acetaminophen-Codeine Nausea And Vomiting  . Antiseptic Oral Rinse  [Cetylpyridinium Chloride] Other (See Comments)    UNK  . Aspartame Other (See Comments)    Reaction: unknown  . Biaxin [Clarithromycin] Nausea And Vomiting  . Carafate [Sucralfate]     Pt says her "Stomach hurts" when she takes it.    . Chlorhexidine Gluconate Nausea And Vomiting  . Clindamycin/Lincomycin Nausea And Vomiting  . Codeine Itching and Nausea And Vomiting  . Dextromethorphan Hbr Other (See Comments)    Reaction: unknown  . Dilaudid [Hydromorphone Hcl] Nausea And Vomiting  . Doxycycline Nausea And Vomiting  . Fentanyl Nausea And Vomiting  . Germanium Other (See Comments)  . Hydrocodone Nausea And Vomiting  . Ketorolac Other (See Comments)  . Levofloxacin Other (See Comments)    GI upset. GI upset  . Mefenamic Acid Nausea And Vomiting  . Metformin And Related Nausea And Vomiting  . Metronidazole Diarrhea and Nausea And Vomiting  .  Morphine And Related Nausea And Vomiting  . Moxifloxacin Swelling  . Nitrofurantoin Nausea And Vomiting and Other (See Comments)  . Nsaids Other (See Comments)    Reaction: unknown  . Oxycodone-Acetaminophen Nausea And Vomiting  . Periguard [Dimethicone] Nausea And Vomiting  . Phenothiazines Nausea And Vomiting  . Pioglitazone Nausea And Vomiting  . Quinidine Nausea And Vomiting  . Quinolones Nausea And Vomiting  . Rumex Crispus Other (See Comments)  . Tetracyclines & Related Nausea And Vomiting  . Toradol [Ketorolac Tromethamine] Nausea And Vomiting  . Tramadol Nausea And Vomiting  . Tussin [Guaifenesin] Nausea And Vomiting  . Tussionex Pennkinetic Er [Hydrocod Polst-Cpm Polst Er] Nausea And Vomiting  . Buprenorphine Hcl Nausea And Vomiting  . Lincomycin Hcl Nausea And Vomiting  . Oxycodone-Acetaminophen Hives and Nausea And Vomiting    Other reaction(s): Nausea And Vomiting, Unknown  . Phenylalanine Nausea And Vomiting     Current Outpatient Medications:  .  albuterol (PROAIR HFA) 108 (90 Base) MCG/ACT inhaler, Inhale 1-2 puffs into the lungs every 6 (six) hours as needed for shortness of breath., Disp: 8.5 Inhaler, Rfl: 5 .  azelastine (ASTELIN) 0.1 % nasal spray, Place 1 spray into both nostrils 2 (two) times daily., Disp: 30 mL, Rfl: 12 .  celecoxib (CELEBREX) 200 MG capsule, TAKE 1 CAPSULE (200 MG TOTAL) 2 (TWO) TIMES DAILY BY MOUTH., Disp: 60 capsule, Rfl: 6 .  cetirizine (ZYRTEC) 10 MG tablet, Take 1 tablet (10 mg total) by  mouth daily., Disp: 30 tablet, Rfl: 11 .  citalopram (CELEXA) 20 MG tablet, TAKE 0.5 TABLETS (10 MG TOTAL) BY MOUTH DAILY., Disp: 15 tablet, Rfl: 4 .  COMBIVENT RESPIMAT 20-100 MCG/ACT AERS respimat, INHALE 1 PUFF INTO THE LUNGS EVERY 4 (FOUR) HOURS AS NEEDED FOR WHEEZING., Disp: 1 Inhaler, Rfl: 5 .  estradiol (ESTRACE) 0.5 MG tablet, Take 1 tablet (0.5 mg total) by mouth daily., Disp: 30 tablet, Rfl: 5 .  fexofenadine (ALLEGRA) 60 MG tablet, Take 1 tablet  (60 mg total) by mouth 2 (two) times daily., Disp: 60 tablet, Rfl: 4 .  fluticasone (FLONASE) 50 MCG/ACT nasal spray, Place 2 sprays into both nostrils daily as needed for allergies or rhinitis. Reported on 04/03/2015, Disp: 16 g, Rfl: 5 .  gabapentin (NEURONTIN) 300 MG capsule, TAKE 1 CAPSULE (300 MG TOTAL) BY MOUTH DAILY., Disp: 30 capsule, Rfl: 11 .  hyoscyamine (LEVSIN, ANASPAZ) 0.125 MG tablet, TAKE ONE TABLET BY MOUTH EVERY 6 HOURS AS NEEDED FOR CRAMPING FOR UP TO 10 DAYS, Disp: 30 tablet, Rfl: 5 .  ipratropium (ATROVENT) 0.03 % nasal spray, Place 2 sprays into the nose daily as needed., Disp: 30 mL, Rfl: 3 .  montelukast (SINGULAIR) 10 MG tablet, TAKE 1 TABLET (10 MG TOTAL) BY MOUTH AT BEDTIME., Disp: 30 tablet, Rfl: 12 .  nortriptyline (PAMELOR) 50 MG capsule, Take 1 capsule (50 mg total) by mouth daily as needed (headache)., Disp: 30 capsule, Rfl: 5 .  pantoprazole (PROTONIX) 40 MG tablet, TAKE 1 TABLET BY MOUTH TWICE A DAY, Disp: 60 tablet, Rfl: 5 .  loperamide (IMODIUM A-D) 2 MG tablet, Take 1 tablet (2 mg total) by mouth 4 (four) times daily as needed for diarrhea or loose stools. (Patient not taking: Reported on 04/28/2017), Disp: 30 tablet, Rfl: 0 .  ondansetron (ZOFRAN ODT) 4 MG disintegrating tablet, Take 1 tablet (4 mg total) by mouth every 6 (six) hours as needed for nausea or vomiting. (Patient not taking: Reported on 04/28/2017), Disp: 20 tablet, Rfl: 0  Review of Systems  Constitutional: Positive for chills.  HENT: Positive for congestion and sore throat.   Respiratory: Positive for cough.   Gastrointestinal: Positive for nausea.  Genitourinary: Positive for frequency. Negative for hematuria.  Neurological: Positive for headaches.    Social History   Tobacco Use  . Smoking status: Never Smoker  . Smokeless tobacco: Never Used  Substance Use Topics  . Alcohol use: No   Objective:   BP 104/62 (BP Location: Right Arm, Patient Position: Sitting, Cuff Size: Normal)   Pulse  66   Temp 97.8 F (36.6 C)   Resp 16   Wt 123 lb (55.8 kg)   SpO2 99%   BMI 26.62 kg/m  Vitals:   04/28/17 1125  BP: 104/62  Pulse: 66  Resp: 16  Temp: 97.8 F (36.6 C)  SpO2: 99%  Weight: 123 lb (55.8 kg)     Physical Exam  General Appearance:    Alert, cooperative, no distress  HENT:   bilateral TM normal without fluid or infection, neck without nodes, throat normal without erythema or exudate and nasal mucosa congested  Eyes:    PERRL, conjunctiva/corneas clear, EOM's intact       Lungs:     Rare expiratory wheeze, no focal rales or rhonchi respirations unlabored  Heart:    Regular rate and rhythm  Neurologic:   Awake, alert, oriented x 3. No apparent focal neurological           defect.  Results for orders placed or performed in visit on 04/28/17  POCT urinalysis dipstick  Result Value Ref Range   Color, UA light yellow    Clarity, UA clear    Glucose, UA negative    Bilirubin, UA negative    Ketones, UA negative    Spec Grav, UA 1.010 1.010 - 1.025   Blood, UA non hemolyzed trace    pH, UA 6.5 5.0 - 8.0   Protein, UA negative    Urobilinogen, UA 0.2 0.2 or 1.0 E.U./dL   Nitrite, UA negative    Leukocytes, UA Negative Negative       Assessment & Plan:     1. Urinary frequency  - POCT urinalysis dipstick - fluconazole (DIFLUCAN) 150 MG tablet; Take 1 tablet (150 mg total) by mouth once for 1 dose.  Dispense: 1 tablet; Refill: 0  2. Bronchitis - amoxicillin-clavulanate (AUGMENTIN) 875-125 MG tablet; Take 1 tablet by mouth 2 (two) times daily for 7 days.  Dispense: 14 tablet; Refill: 0  3. Cough  Call if symptoms change or if not rapidly improving.          Lelon Huh, MD  [facility]

## 2017-04-28 NOTE — Patient Instructions (Signed)
Start taking OTC B-complex vitamin  once every day to help energy and immune system

## 2017-05-05 ENCOUNTER — Telehealth: Payer: Self-pay | Admitting: Family Medicine

## 2017-05-05 MED ORDER — CEFDINIR 300 MG PO CAPS: 300.0000 mg | ORAL_CAPSULE | Freq: Two times a day (BID) | ORAL | 0 refills | Status: AC

## 2017-05-05 NOTE — Telephone Encounter (Signed)
Can change to cefdinir 300mg  two tablets daily x 10 d, #20

## 2017-05-05 NOTE — Telephone Encounter (Signed)
Pt states she is still coughing up green mucus and just finish her antibiotic.  Pt would like to know if she needs to take anything else.

## 2017-05-05 NOTE — Telephone Encounter (Signed)
Does patient need to come back in for re-evaluation? She was last seen 04/28/2017 for bronchitis/cough/ URI, and was treated with Augmentin. Per office visit note, patient was to call if symptoms change or not rapidly improving. Please advise.

## 2017-05-05 NOTE — Telephone Encounter (Signed)
Advised patient as below. Medication was sent into the pharmacy.  

## 2017-05-09 ENCOUNTER — Emergency Department: Payer: Medicaid Other

## 2017-05-09 ENCOUNTER — Emergency Department
Admission: EM | Admit: 2017-05-09 | Discharge: 2017-05-09 | Disposition: A | Discharge status: Home / Self Care | Payer: Medicaid Other | Attending: Emergency Medicine | Admitting: Emergency Medicine

## 2017-05-09 DIAGNOSIS — Z79899 Other long term (current) drug therapy: Secondary | ICD-10-CM | POA: Insufficient documentation

## 2017-05-09 DIAGNOSIS — K529 Noninfective gastroenteritis and colitis, unspecified: Secondary | ICD-10-CM | POA: Diagnosis not present

## 2017-05-09 DIAGNOSIS — R079 Chest pain, unspecified: Secondary | ICD-10-CM | POA: Diagnosis present

## 2017-05-09 LAB — URINALYSIS, COMPLETE (UACMP) WITH MICROSCOPIC
Bilirubin Urine: NEGATIVE
GLUCOSE, UA: NEGATIVE mg/dL
Ketones, ur: NEGATIVE mg/dL
Leukocytes, UA: NEGATIVE
NITRITE: NEGATIVE
PROTEIN: 30 mg/dL — AB
Specific Gravity, Urine: 1.029 (ref 1.005–1.030)
pH: 5 (ref 5.0–8.0)

## 2017-05-09 LAB — CBC
HCT: 39.1 % (ref 35.0–47.0)
Hemoglobin: 12.8 g/dL (ref 12.0–16.0)
MCH: 26.6 pg (ref 26.0–34.0)
MCHC: 32.8 g/dL (ref 32.0–36.0)
MCV: 81.2 fL (ref 80.0–100.0)
PLATELETS: 157 10*3/uL (ref 150–440)
RBC: 4.82 MIL/uL (ref 3.80–5.20)
RDW: 15.1 % — AB (ref 11.5–14.5)
WBC: 12.8 10*3/uL — AB (ref 3.6–11.0)

## 2017-05-09 LAB — BASIC METABOLIC PANEL
Anion gap: 8 (ref 5–15)
BUN: 10 mg/dL (ref 6–20)
CHLORIDE: 105 mmol/L (ref 101–111)
CO2: 26 mmol/L (ref 22–32)
CREATININE: 0.79 mg/dL (ref 0.44–1.00)
Calcium: 8.9 mg/dL (ref 8.9–10.3)
GFR calc Af Amer: >60 mL/min (ref >60)
GFR calc non Af Amer: >60 mL/min (ref >60)
Glucose, Bld: 120 mg/dL — ABNORMAL HIGH (ref 65–99)
Potassium: 3.6 mmol/L (ref 3.5–5.1)
SODIUM: 139 mmol/L (ref 135–145)

## 2017-05-09 LAB — URINE DRUG SCREEN, QUALITATIVE (ARMC ONLY)
AMPHETAMINES, UR SCREEN: NOT DETECTED
Barbiturates, Ur Screen: NOT DETECTED
Benzodiazepine, Ur Scrn: NOT DETECTED
Cannabinoid 50 Ng, Ur ~~LOC~~: NOT DETECTED
Cocaine Metabolite,Ur ~~LOC~~: NOT DETECTED
MDMA (Ecstasy)Ur Screen: NOT DETECTED
METHADONE SCREEN, URINE: NOT DETECTED
Opiate, Ur Screen: NOT DETECTED
Phencyclidine (PCP) Ur S: NOT DETECTED
TRICYCLIC, UR SCREEN: NOT DETECTED

## 2017-05-09 LAB — HEPATIC FUNCTION PANEL
ALBUMIN: 4 g/dL (ref 3.5–5.0)
ALK PHOS: 68 U/L (ref 38–126)
ALT: 11 U/L — AB (ref 14–54)
AST: 25 U/L (ref 15–41)
Bilirubin, Direct: 0.2 mg/dL (ref 0.1–0.5)
Indirect Bilirubin: 1.1 mg/dL — ABNORMAL HIGH (ref 0.3–0.9)
TOTAL PROTEIN: 7.3 g/dL (ref 6.5–8.1)
Total Bilirubin: 1.3 mg/dL — ABNORMAL HIGH (ref 0.3–1.2)

## 2017-05-09 LAB — LIPASE, BLOOD: Lipase: 22 U/L (ref 11–51)

## 2017-05-09 LAB — TROPONIN I
Troponin I: <0.03 ng/mL (ref <0.03)
Troponin I: <0.03 ng/mL (ref <0.03)

## 2017-05-09 MED ORDER — ONDANSETRON 4 MG PO TBDP
4.0000 mg | ORAL_TABLET | Freq: Once | ORAL | Status: AC | PRN
  Administered 2017-05-09: 4 mg via ORAL

## 2017-05-09 MED ORDER — SODIUM CHLORIDE 0.9 % IV BOLUS
1000.0000 mL | Freq: Once | INTRAVENOUS | Status: AC
  Administered 2017-05-09: 1000 mL via INTRAVENOUS

## 2017-05-09 MED ORDER — ONDANSETRON HCL 4 MG PO TABS: 4.0000 mg | ORAL_TABLET | Freq: Three times a day (TID) | ORAL | 0 refills | Status: DC | PRN

## 2017-05-09 MED ORDER — ONDANSETRON 4 MG PO TBDP
ORAL_TABLET | ORAL | Status: AC
  Filled 2017-05-09: qty 1

## 2017-05-09 MED ORDER — ONDANSETRON 4 MG PO TBDP
4.0000 mg | ORAL_TABLET | Freq: Once | ORAL | Status: AC
  Administered 2017-05-09: 4 mg via ORAL

## 2017-05-09 MED ORDER — ONDANSETRON HCL 4 MG/2ML IJ SOLN
4.0000 mg | Freq: Once | INTRAMUSCULAR | Status: AC
  Administered 2017-05-09: 4 mg via INTRAVENOUS
  Filled 2017-05-09: qty 2

## 2017-05-09 NOTE — ED Notes (Signed)
Pt 31 c/o continued abd pain and nausea. No vomiting produced since arrival or diarrhea. Explained to pt that it could be the antibiotics she is taking for her bronchitis. Pt has reactions of n/v to 29 medications. Pt given a zofran and told to get her RX filled tomorrow and to eat crackers and drink gingerale. F/u with her MD tomorrow.

## 2017-05-09 NOTE — ED Triage Notes (Addendum)
Attempted to pull and get EKG, pt profusely vomiting and have loose stools in bathroom.

## 2017-05-09 NOTE — ED Notes (Signed)
Asked pt to give a stool sample. Pt says she had diarrhea this morning and last night. No diarrhea since here. Pt states she vomited in the sink. Small amount of spit noted in the sink. Pt states her BP runs low normally. Will recheck BP with pt sitting up

## 2017-05-09 NOTE — ED Triage Notes (Addendum)
Pt reports midsternal chest pain that started last night. Then pt started vomiting and having diarrhea at home.

## 2017-05-09 NOTE — ED Notes (Signed)
Pt laying quietly in the dark. Pt speaking on cell phone. No distress noted.
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
## 2017-05-09 NOTE — Discharge Instructions (Signed)
Drink plenty of fluids and electrolyte containing fluids, return to the emergency room for any new or worrisome symptoms follow closely with your doctor

## 2017-05-11 ENCOUNTER — Telehealth: Payer: Self-pay | Admitting: Family Medicine

## 2017-05-11 NOTE — Telephone Encounter (Signed)
Patient scheduled ER follow-up for 05/12/2017 at 11:20 am. Patient states she is still having nausea, vomiting and coughing up green stuff.

## 2017-05-11 NOTE — Telephone Encounter (Signed)
pt was in the ER Sunday for flu like symptoms, diarrhea, throwing up, coughing.  They said she probably has a virus.  She is still coughing a lot and would like a nurse to call her back.   Pt's call back is [phone]  Thanks Con Memos

## 2017-05-12 ENCOUNTER — Encounter: Payer: Self-pay | Admitting: Family Medicine

## 2017-05-12 ENCOUNTER — Other Ambulatory Visit (INDEPENDENT_AMBULATORY_CARE_PROVIDER_SITE_OTHER): Payer: Medicaid Other | Admitting: *Deleted

## 2017-05-12 ENCOUNTER — Ambulatory Visit: Payer: Medicaid Other | Admitting: Family Medicine

## 2017-05-12 ENCOUNTER — Other Ambulatory Visit: Payer: Self-pay | Admitting: Family Medicine

## 2017-05-12 VITALS — BP 94/56 | HR 75 | Temp 98.0°F | Wt 122.2 lb

## 2017-05-12 DIAGNOSIS — R109 Unspecified abdominal pain: Secondary | ICD-10-CM

## 2017-05-12 DIAGNOSIS — M5489 Other dorsalgia: Secondary | ICD-10-CM | POA: Diagnosis not present

## 2017-05-12 DIAGNOSIS — R059 Cough, unspecified: Secondary | ICD-10-CM

## 2017-05-12 DIAGNOSIS — K921 Melena: Secondary | ICD-10-CM

## 2017-05-12 DIAGNOSIS — R05 Cough: Secondary | ICD-10-CM | POA: Diagnosis not present

## 2017-05-12 LAB — IFOBT (OCCULT BLOOD): IFOBT: POSITIVE

## 2017-05-12 MED ORDER — PREDNISONE 20 MG PO TABS: 20.0000 mg | ORAL_TABLET | Freq: Two times a day (BID) | ORAL | 0 refills | Status: AC

## 2017-05-12 MED ORDER — RANITIDINE HCL 150 MG PO TABS: 150.0000 mg | ORAL_TABLET | Freq: Two times a day (BID) | ORAL | 0 refills | Status: DC

## 2017-05-12 NOTE — Progress Notes (Signed)
err

## 2017-05-12 NOTE — Progress Notes (Signed)
Patient: Erin Richards Female    DOB: Aug 22, 1965   52 y.o.   MRN: [id number] Visit Date: 05/12/2017  Today's Provider: Lelon Huh, MD   Chief Complaint  Patient presents with  . ER Follow Up   Subjective:    HPI   Follow up ER visit  Patient was seen in ER for nausea, vomiting and diarrhea on 05/09/2017. She was treated for gastroenteritis. Treatment for this included; given IV fluids. Patient appeared well and was sent home. She states that diarrhea has resolved but stools are now black and tarry. She states she did take a few doses of Pepto-bismol a few nights ago, but none since then. She still feels very nauseated, but is keeping down liquids. She have very poor appetite and not trying to eat much for solid food. She states her stomach hurts all over and is worse when she tries to eat. She does have prescription for hyoscyamine which does not help.   ------------------------------------------------------------------------------------  Patient was seen on 04/28/2017 with persistent productive cough of one weeks duration. She was prescribed Augmenting at that time. She called office on 05/05/3017 stating cough was not improved at all and was changed to cefdinir. She states she continue to have persistent cough productive green phlegm associated with some wheezing. She had normal chest XR at her ER visit on 05/09/2017. She states she is using her Combivent inhaler on regular basis and it doesn't seem to help anymore. State that cough is now making her chest hurt constantly and want's something to make it go away.   She also reports that her back hurts constantly, it was bothering her before she got sick, but has been much worse since then. She also has been having persistent global headaches. She states she has been taking Tylenol which provides mild relief, but denies NSAID use, specifically Advil and Motrin    Allergies  Allergen Reactions  . Acetaminophen-Codeine Nausea And  Vomiting  . Antiseptic Oral Rinse  [Cetylpyridinium Chloride] Other (See Comments)    UNK  . Aspartame Other (See Comments)    Reaction: unknown  . Biaxin [Clarithromycin] Nausea And Vomiting  . Carafate [Sucralfate]     Pt says her "Stomach hurts" when she takes it.    . Chlorhexidine Gluconate Nausea And Vomiting  . Clindamycin/Lincomycin Nausea And Vomiting  . Codeine Itching and Nausea And Vomiting  . Dextromethorphan Hbr Other (See Comments)    Reaction: unknown  . Dilaudid [Hydromorphone Hcl] Nausea And Vomiting  . Doxycycline Nausea And Vomiting  . Fentanyl Nausea And Vomiting  . Germanium Other (See Comments)  . Hydrocodone Nausea And Vomiting  . Ketorolac Other (See Comments)  . Levofloxacin Other (See Comments)    GI upset. GI upset  . Mefenamic Acid Nausea And Vomiting  . Metformin And Related Nausea And Vomiting  . Metronidazole Diarrhea and Nausea And Vomiting  . Morphine And Related Nausea And Vomiting  . Moxifloxacin Swelling  . Nitrofurantoin Nausea And Vomiting and Other (See Comments)  . Nsaids Other (See Comments)    Reaction: unknown  . Oxycodone-Acetaminophen Nausea And Vomiting  . Periguard [Dimethicone] Nausea And Vomiting  . Phenothiazines Nausea And Vomiting  . Pioglitazone Nausea And Vomiting  . Quinidine Nausea And Vomiting  . Quinolones Nausea And Vomiting  . Rumex Crispus Other (See Comments)  . Tetracyclines & Related Nausea And Vomiting  . Toradol [Ketorolac Tromethamine] Nausea And Vomiting  . Tramadol Nausea And Vomiting  . Tussin [Guaifenesin] Nausea  And Vomiting  . Tussionex Pennkinetic Er [Hydrocod Polst-Cpm Polst Er] Nausea And Vomiting  . Buprenorphine Hcl Nausea And Vomiting  . Lincomycin Hcl Nausea And Vomiting  . Oxycodone-Acetaminophen Hives and Nausea And Vomiting    Other reaction(s): Nausea And Vomiting, Unknown  . Phenylalanine Nausea And Vomiting     Current Outpatient Medications:  .  albuterol (PROAIR HFA) 108 (90  Base) MCG/ACT inhaler, Inhale 1-2 puffs into the lungs every 6 (six) hours as needed for shortness of breath., Disp: 8.5 Inhaler, Rfl: 5 .  azelastine (ASTELIN) 0.1 % nasal spray, Place 1 spray into both nostrils 2 (two) times daily., Disp: 30 mL, Rfl: 12 .  cefdinir (OMNICEF) 300 MG capsule, Take 1 capsule (300 mg total) by mouth 2 (two) times daily for 10 days., Disp: 20 capsule, Rfl: 0 .  celecoxib (CELEBREX) 200 MG capsule, TAKE 1 CAPSULE (200 MG TOTAL) 2 (TWO) TIMES DAILY BY MOUTH., Disp: 60 capsule, Rfl: 6 .  cetirizine (ZYRTEC) 10 MG tablet, Take 1 tablet (10 mg total) by mouth daily., Disp: 30 tablet, Rfl: 11 .  citalopram (CELEXA) 20 MG tablet, TAKE 0.5 TABLETS (10 MG TOTAL) BY MOUTH DAILY., Disp: 15 tablet, Rfl: 4 .  COMBIVENT RESPIMAT 20-100 MCG/ACT AERS respimat, INHALE 1 PUFF INTO THE LUNGS EVERY 4 (FOUR) HOURS AS NEEDED FOR WHEEZING., Disp: 1 Inhaler, Rfl: 5 .  estradiol (ESTRACE) 0.5 MG tablet, Take 1 tablet (0.5 mg total) by mouth daily., Disp: 30 tablet, Rfl: 5 .  fexofenadine (ALLEGRA) 60 MG tablet, Take 1 tablet (60 mg total) by mouth 2 (two) times daily., Disp: 60 tablet, Rfl: 4 .  fluticasone (FLONASE) 50 MCG/ACT nasal spray, Place 2 sprays into both nostrils daily as needed for allergies or rhinitis. Reported on 04/03/2015, Disp: 16 g, Rfl: 5 .  gabapentin (NEURONTIN) 300 MG capsule, TAKE 1 CAPSULE (300 MG TOTAL) BY MOUTH DAILY., Disp: 30 capsule, Rfl: 11 .  hyoscyamine (LEVSIN, ANASPAZ) 0.125 MG tablet, TAKE ONE TABLET BY MOUTH EVERY 6 HOURS AS NEEDED FOR CRAMPING FOR UP TO 10 DAYS, Disp: 30 tablet, Rfl: 5 .  ipratropium (ATROVENT) 0.03 % nasal spray, Place 2 sprays into the nose daily as needed., Disp: 30 mL, Rfl: 3 .  loperamide (IMODIUM A-D) 2 MG tablet, Take 1 tablet (2 mg total) by mouth 4 (four) times daily as needed for diarrhea or loose stools. (Patient not taking: Reported on 04/28/2017), Disp: 30 tablet, Rfl: 0 .  montelukast (SINGULAIR) 10 MG tablet, TAKE 1 TABLET (10 MG  TOTAL) BY MOUTH AT BEDTIME., Disp: 30 tablet, Rfl: 12 .  nortriptyline (PAMELOR) 50 MG capsule, Take 1 capsule (50 mg total) by mouth daily as needed (headache)., Disp: 30 capsule, Rfl: 5 .  ondansetron (ZOFRAN ODT) 4 MG disintegrating tablet, Take 1 tablet (4 mg total) by mouth every 6 (six) hours as needed for nausea or vomiting. (Patient not taking: Reported on 04/28/2017), Disp: 20 tablet, Rfl: 0 .  ondansetron (ZOFRAN) 4 MG tablet, Take 1 tablet (4 mg total) by mouth every 8 (eight) hours as needed for nausea or vomiting., Disp: 8 tablet, Rfl: 0 .  pantoprazole (PROTONIX) 40 MG tablet, TAKE 1 TABLET BY MOUTH TWICE A DAY, Disp: 60 tablet, Rfl: 5  Review of Systems  Constitutional: Negative for appetite change, chills, fatigue and fever.  HENT: Positive for congestion.   Respiratory: Negative for chest tightness and shortness of breath.   Cardiovascular: Negative for chest pain and palpitations.  Gastrointestinal: Negative for abdominal pain, nausea  and vomiting.  Musculoskeletal: Positive for back pain.  Neurological: Negative for dizziness and weakness.    Social History   Tobacco Use  . Smoking status: Never Smoker  . Smokeless tobacco: Never Used  Substance Use Topics  . Alcohol use: No   Objective:   BP (!) 94/56 (BP Location: Right Arm, Patient Position: Sitting, Cuff Size: Normal)   Pulse 75   Temp 98 F (36.7 C) (Oral)   Wt 122 lb 3.2 oz (55.4 kg)   SpO2 96%   BMI 24.68 kg/m     Physical Exam   General Appearance:    Well appearing, Alert, cooperative, no distress  Eyes:    PERRL, conjunctiva/corneas clear, EOM's intact       Lungs:     Clear to auscultation bilaterally, respirations unlabored  Heart:    Regular rate and rhythm  MS:   Diffuse tenderness along thoracic and lumbar spine and para-spinous muscles.   Neurologic:   Awake, alert, oriented x 3. No apparent focal neurological           defect.   Abd:   Diffusely tender, no focal tenderness, no rebound or  guarding.        Assessment & Plan:     1. Cough Persistent after treatment with Augmentin and cefdinir. She is very frustrated with cough and wants something to make it go away. She has extensive list of medication/antibiotic allergies. She has been on Combivent for years but I don't have clear records of her underlying lung disease.  - Ambulatory referral to Pulmonology  2. Abdominal pain, unspecified abdominal location Likely secondary to viral gastroenteritis, although possible secondary to recent antibiotic use which she has completed. Need to check for occult blood as below and start on H2 blocker in the interim.   3. Other back pain, unspecified chronicity Prednisone 20mg  BID.   4. Melena Possible secondary to ingestion of pepto-bismol. Need to check for occult blood. Consider GI referral. Start ranitidine 150mg  BID for now. Consider h. Pylori testing after she completes occult blood testing.        Lelon Huh, MD  [facility]

## 2017-05-12 NOTE — Addendum Note (Signed)
Addended by: Birdie Sons on: 05/12/2017 03:52 PM   Modules accepted: Orders

## 2017-05-13 LAB — CBC
HEMATOCRIT: 34.3 % (ref 34.0–46.6)
HEMOGLOBIN: 11.2 g/dL (ref 11.1–15.9)
MCH: 26.7 pg (ref 26.6–33.0)
MCHC: 32.7 g/dL (ref 31.5–35.7)
MCV: 82 fL (ref 79–97)
Platelets: 168 10*3/uL (ref 150–379)
RBC: 4.19 x10E6/uL (ref 3.77–5.28)
RDW: 14.3 % (ref 12.3–15.4)
WBC: 6.3 10*3/uL (ref 3.4–10.8)

## 2017-05-13 LAB — COMPREHENSIVE METABOLIC PANEL
ALT: 9 IU/L (ref 0–32)
AST: 18 IU/L (ref 0–40)
Albumin/Globulin Ratio: 1.4 (ref 1.2–2.2)
Albumin: 3.9 g/dL (ref 3.5–5.5)
Alkaline Phosphatase: 64 IU/L (ref 39–117)
BUN/Creatinine Ratio: 12 (ref 9–23)
BUN: 9 mg/dL (ref 6–24)
Bilirubin Total: 0.6 mg/dL (ref 0.0–1.2)
CALCIUM: 9.3 mg/dL (ref 8.7–10.2)
CO2: 27 mmol/L (ref 20–29)
Chloride: 103 mmol/L (ref 96–106)
Creatinine, Ser: 0.75 mg/dL (ref 0.57–1.00)
GFR calc Af Amer: 108 mL/min/{1.73_m2} (ref >59)
GFR, EST NON AFRICAN AMERICAN: 94 mL/min/{1.73_m2} (ref >59)
Globulin, Total: 2.7 g/dL (ref 1.5–4.5)
Glucose: 92 mg/dL (ref 65–99)
Potassium: 3.9 mmol/L (ref 3.5–5.2)
Sodium: 142 mmol/L (ref 134–144)
Total Protein: 6.6 g/dL (ref 6.0–8.5)

## 2017-05-13 LAB — AMYLASE: AMYLASE: 42 U/L (ref 31–124)

## 2017-05-14 ENCOUNTER — Telehealth: Payer: Self-pay | Admitting: Family Medicine

## 2017-05-14 DIAGNOSIS — R109 Unspecified abdominal pain: Secondary | ICD-10-CM

## 2017-05-14 DIAGNOSIS — K921 Melena: Secondary | ICD-10-CM

## 2017-05-14 LAB — H. PYLORI BREATH TEST: H PYLORI BREATH TEST: NEGATIVE

## 2017-05-14 MED ORDER — FLUCONAZOLE 150 MG PO TABS: 150.0000 mg | ORAL_TABLET | Freq: Once | ORAL | 0 refills | Status: AC

## 2017-05-14 NOTE — Telephone Encounter (Signed)
Pt called wanting to know if she could be sched with [facility]. Pt states she has been there before.  She said she spoke to [facility] and they could not get her in until April 25th.  She said she is hurting and wants to be seen asap.  Her call back Is [phone]  Thanks Con Memos

## 2017-05-14 NOTE — Telephone Encounter (Signed)
Ok to change GI doctor? Please advise. Thanks!

## 2017-05-14 NOTE — Telephone Encounter (Signed)
-----   Message from Birdie Sons, MD sent at 05/14/2017  7:45 AM EDT ----- Labs and h.pylori test are all negative. If abdominal pain is not better then she needs to start pantoprazole 40mg  once a day. Need referral to heme positive stool.

## 2017-05-14 NOTE — Telephone Encounter (Signed)
Advised patient of results. Patient reports that she is already taking pantoprazole 40mg . She also reports that she is having diarrhea that is not relived with Imodium. Advised patient that we will set up a referral to GI as well.   Patient is also requesting something called in for a yeast infection. She would like the creme, not the pill. [facility] [place]. Thanks!

## 2017-05-14 NOTE — Telephone Encounter (Signed)
Patient wants to go to [facility] instead of [place].

## 2017-05-14 NOTE — Telephone Encounter (Signed)
Patient called stating she is still having diarrhea and pretty bad stomach pain.  She wants to know if test results are back also.

## 2017-05-14 NOTE — Telephone Encounter (Signed)
Have sent prescription for diflucan for yeast infection. Have sent order for GI referral to [facility].  Continue pantoprazole twice a day and ranitidine that was prescribed earlier this week twice a day If still having diarrhea then she needs to return to labs and order stool for culture, O&P, fecal leukocytes, c-diff toxins, and c-diff PCR.

## 2017-05-17 NOTE — Telephone Encounter (Signed)
Pt advised. She reports that she is feeling better today.

## 2017-05-17 NOTE — Telephone Encounter (Signed)
Appointment rescheduled to see Dawson Bills at [facility]

## 2017-05-20 NOTE — Progress Notes (Addendum)
[facility] Consultation      Assessment and Plan:  Acute on chronic bronchitis. -There is evidence of chronic bronchitis recent chest x-ray. -Patient already has a prescription for Combivent to be used as needed.  Acute asthmatic bronchitis with intractable cough. -Patient currently has an episode of acute asthmatic bronchitis, likely postinfectious with persistent intractable cough, chest congestion. - She is given a prescription for Advair, to be used until the cough has resolved.  I have also given her a prescription for Tessalon, she tells me she has taken this previously and does not have an allergic reaction to it.  Hemoptysis. -Patient tells me she had a few episodes of some blood mixed in with sputum. -Suspect that this is secondary to severe cough, however if persistent next visit will consider getting a CT scan of the chest.   Date: 05/21/2017  MRN# [id number] Erin Richards [birth year]  Referring Physician: self.   KEARSTEN GINTHER is a 52 y.o. old female seen in consultation for chief complaint of:    Chief Complaint  Patient presents with  . pulmonary consult    pt reports of prod cough with green mucus, chest tightness, nasal drainage yellow to clear in color & chest congestion x4w    HPI:  She had a resp infection about 4 weeks ago, she was given a course of amox, then cephalexin. The symptoms persisted since that time, and she was given a course of prednisone, she completed it and continues to have cough, hoarseness, and mucus production.   She was never a smoker no history of respiratory disease. A few times she coughed up some blood mixed in with sputum. She has never had any cancer, but has ear skin cancer.  She denies fevers or chills. She has ASD, she gets winded with activity. She has been maintained on combivent as needed which she has used twice in the past 4 weeks.  She is disabled from her heart condition. She has a dog that sleeps in  bed with her.  She had allergy tested and was positive for cats, mold, mildew.   She has used tessalon in the past without a reaction.  Imaging personally reviewed chest x-ray 05/09/17, chest x-ray shows changes of chronic bronchitis, lungs are otherwise unremarkable.   PMHX:   Past Medical History:  Diagnosis Date  . Allergy   . Clostridium difficile colitis 10/07/2014  . Depression   . History of Clostridium difficile colitis 09/24/2015  . IBS (irritable bowel syndrome)   . 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 ABDOMINAL HYSTERECTOMY     supracervical  . CARDIAC SURGERY    . COLONOSCOPY WITH PROPOFOL N/A 08/16/2014   Procedure: COLONOSCOPY WITH PROPOFOL;  Surgeon: Manya Silvas, MD;  Location: [facility] END[facility]COPY;  Service: Endoscopy;  Laterality: N/A;  . ESOPHAGOGASTRODUODENOSCOPY (EGD) WITH PROPOFOL  08/16/2014   Procedure: ESOPHAGOGASTRODUODENOSCOPY (EGD) WITH PROPOFOL;  Surgeon: Manya Silvas, MD;  Location: [facility] ENDOSCOPY;  Service: Endoscopy;;   Family Hx:  Family History  Problem Relation Age of Onset  . Heart disease Father   . Cancer Father   . Cancer Sister        BREAST   Social Hx:   Social History   Tobacco Use  . Smoking status: Never Smoker  . Smokeless tobacco: Never Used  Substance Use Topics  . Alcohol use: No  . Drug use: No  Medication:    Current Outpatient Medications:  .  albuterol (PROAIR HFA) 108 (90 Base) MCG/ACT inhaler, Inhale 1-2 puffs into the lungs every 6 (six) hours as needed for shortness of breath., Disp: 8.5 Inhaler, Rfl: 5 .  azelastine (ASTELIN) 0.1 % nasal spray, Place 1 spray into both nostrils 2 (two) times daily., Disp: 30 mL, Rfl: 12 .  celecoxib (CELEBREX) 200 MG capsule, TAKE 1 CAPSULE (200 MG TOTAL) 2 (TWO) TIMES DAILY BY MOUTH., Disp: 60 capsule, Rfl: 6 .  cetirizine (ZYRTEC) 10 MG tablet, Take 1 tablet (10 mg total) by mouth daily.,  Disp: 30 tablet, Rfl: 11 .  citalopram (CELEXA) 20 MG tablet, TAKE 0.5 TABLETS (10 MG TOTAL) BY MOUTH DAILY., Disp: 15 tablet, Rfl: 4 .  COMBIVENT RESPIMAT 20-100 MCG/ACT AERS respimat, INHALE 1 PUFF INTO THE LUNGS EVERY 4 (FOUR) HOURS AS NEEDED FOR WHEEZING., Disp: 1 Inhaler, Rfl: 5 .  estradiol (ESTRACE) 0.5 MG tablet, Take 1 tablet (0.5 mg total) by mouth daily., Disp: 30 tablet, Rfl: 5 .  fexofenadine (ALLEGRA) 60 MG tablet, Take 1 tablet (60 mg total) by mouth 2 (two) times daily., Disp: 60 tablet, Rfl: 4 .  fluticasone (FLONASE) 50 MCG/ACT nasal spray, Place 2 sprays into both nostrils daily as needed for allergies or rhinitis. Reported on 04/03/2015, Disp: 16 g, Rfl: 5 .  gabapentin (NEURONTIN) 300 MG capsule, TAKE 1 CAPSULE (300 MG TOTAL) BY MOUTH DAILY., Disp: 30 capsule, Rfl: 11 .  hyoscyamine (LEVSIN, ANASPAZ) 0.125 MG tablet, TAKE ONE TABLET BY MOUTH EVERY 6 HOURS AS NEEDED FOR CRAMPING FOR UP TO 10 DAYS, Disp: 30 tablet, Rfl: 5 .  ipratropium (ATROVENT) 0.03 % nasal spray, Place 2 sprays into the nose daily as needed., Disp: 30 mL, Rfl: 3 .  loperamide (IMODIUM A-D) 2 MG tablet, Take 1 tablet (2 mg total) by mouth 4 (four) times daily as needed for diarrhea or loose stools., Disp: 30 tablet, Rfl: 0 .  montelukast (SINGULAIR) 10 MG tablet, TAKE 1 TABLET (10 MG TOTAL) BY MOUTH AT BEDTIME., Disp: 30 tablet, Rfl: 12 .  nortriptyline (PAMELOR) 50 MG capsule, Take 1 capsule (50 mg total) by mouth daily as needed (headache)., Disp: 30 capsule, Rfl: 5 .  ondansetron (ZOFRAN ODT) 4 MG disintegrating tablet, Take 1 tablet (4 mg total) by mouth every 6 (six) hours as needed for nausea or vomiting., Disp: 20 tablet, Rfl: 0 .  pantoprazole (PROTONIX) 40 MG tablet, TAKE 1 TABLET BY MOUTH TWICE A DAY, Disp: 60 tablet, Rfl: 5 .  ranitidine (ZANTAC) 150 MG tablet, Take 1 tablet (150 mg total) by mouth 2 (two) times daily., Disp: 30 tablet, Rfl: 0   Allergies:  Acetaminophen-codeine; Antiseptic oral rinse   [cetylpyridinium chloride]; Aspartame; Biaxin [clarithromycin]; Carafate [sucralfate]; Chlorhexidine gluconate; Clindamycin/lincomycin; Codeine; Dextromethorphan hbr; Dilaudid [hydromorphone hcl]; Doxycycline; Fentanyl; Germanium; Hydrocodone; Ketorolac; Levofloxacin; Mefenamic acid; Metformin and related; Metronidazole; Morphine and related; Moxifloxacin; Nitrofurantoin; Nsaids; Oxycodone-acetaminophen; Periguard [dimethicone]; Phenothiazines; Pioglitazone; Quinidine; Quinolones; Rumex crispus; Tetracyclines & related; Toradol [ketorolac tromethamine]; Tramadol; Tussin [guaifenesin]; Tussionex pennkinetic er [employer] er]; Buprenorphine hcl; Lincomycin hcl; Oxycodone-acetaminophen; and Phenylalanine  Review of Systems: Gen:  Denies  fever, sweats, chills HEENT: Denies blurred vision, double vision. bleeds, sore throat Cvc:  No dizziness, chest pain. Resp:   Denies cough or sputum production, shortness of breath Gi: Denies swallowing difficulty, stomach pain. Gu:  Denies bladder incontinence, burning urine Ext:   No Joint pain, stiffness. Skin: No skin rash,  hives  Endoc:  No  polyuria, polydipsia. Psych: No depression, insomnia. Other:  All other systems were reviewed with the patient and were negative other that what is mentioned in the HPI.   Physical Examination:   VS: BP 110/60 (BP Location: Left Arm, Cuff Size: Normal)   Pulse 62   Ht 4\' 9"  (1.448 m)   Wt 123 lb 12.8 oz (56.2 kg)   SpO2 98%   BMI 26.79 kg/m   General Appearance: No distress  Neuro:without focal findings,  speech normal,  HEENT: PERRLA, EOM intact.   Pulmonary: normal breath sounds, No wheezing.  CardiovascularNormal S1,S2.  No m/r/g.   Abdomen: Benign, Soft, non-tender. Renal:  No costovertebral tenderness  GU:  No performed at this time. Endoc: No evident thyromegaly, no signs of acromegaly. Skin:   warm, no rashes, no ecchymosis  Extremities: normal, no cyanosis, clubbing.  Other  findings:    LABORATORY PANEL:   CBC No results for input(s): WBC, HGB, HCT, PLT in the last 168 hours. ------------------------------------------------------------------------------------------------------------------  Chemistries  No results for input(s): NA, K, CL, CO2, GLUCOSE, BUN, CREATININE, CALCIUM, MG, AST, ALT, ALKPHOS, BILITOT in the last 168 hours.  Invalid input(s): GFRCGP ------------------------------------------------------------------------------------------------------------------  Cardiac Enzymes No results for input(s): TROPONINI in the last 168 hours. ------------------------------------------------------------  RADIOLOGY:  No results found.     Thank  you for the consultation and for allowing [facility] to assist in the care of your patient. Our recommendations are noted above.  Please contact us if we can be of further service.   Marda Stalker, MD.  Board Certified in Internal Medicine, Pulmonary Medicine, [place], and Sleep Medicine.  [facility] Office Number: [phone]  Patricia Pesa, M.D.  Merton Border, M.D  05/21/2017

## 2017-05-21 ENCOUNTER — Ambulatory Visit (INDEPENDENT_AMBULATORY_CARE_PROVIDER_SITE_OTHER): Payer: Medicaid Other | Admitting: Internal Medicine

## 2017-05-21 ENCOUNTER — Encounter: Payer: Self-pay | Admitting: Internal Medicine

## 2017-05-21 ENCOUNTER — Telehealth: Payer: Self-pay | Admitting: Internal Medicine

## 2017-05-21 VITALS — BP 110/60 | HR 62 | Ht <= 58 in | Wt 123.8 lb

## 2017-05-21 DIAGNOSIS — J4550 Severe persistent asthma, uncomplicated: Secondary | ICD-10-CM

## 2017-05-21 DIAGNOSIS — J440 Chronic obstructive pulmonary disease with acute lower respiratory infection: Secondary | ICD-10-CM

## 2017-05-21 DIAGNOSIS — J209 Acute bronchitis, unspecified: Secondary | ICD-10-CM | POA: Diagnosis not present

## 2017-05-21 MED ORDER — FLUTICASONE-SALMETEROL 250-50 MCG/DOSE IN AEPB: 1.0000 | INHALATION_SPRAY | Freq: Two times a day (BID) | RESPIRATORY_TRACT | 1 refills | Status: DC

## 2017-05-21 MED ORDER — BENZONATATE 100 MG PO CAPS: 200.0000 mg | ORAL_CAPSULE | Freq: Three times a day (TID) | ORAL | 2 refills | Status: DC

## 2017-05-21 NOTE — Patient Instructions (Addendum)
Will start advair inhaler, 1 puffs twice daily, rinse mouth after use. Use until the cough is gone.  Start tessalon tablets three times daily. Use until the cough is gone.
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
## 2017-05-24 ENCOUNTER — Telehealth: Payer: Self-pay | Admitting: Family Medicine

## 2017-05-24 ENCOUNTER — Other Ambulatory Visit: Payer: Self-pay | Admitting: Family Medicine

## 2017-05-24 DIAGNOSIS — R109 Unspecified abdominal pain: Secondary | ICD-10-CM

## 2017-05-24 NOTE — Telephone Encounter (Signed)
Pt has an appt tomorrow with Denice Paradise but she reports that she is having very frequent urination and abd pain.  Please advise [phone]  Thanks Con Memos

## 2017-05-25 ENCOUNTER — Ambulatory Visit
Admission: RE | Admit: 2017-05-25 | Discharge: 2017-05-25 | Disposition: A | Discharge status: Home / Self Care | Payer: Medicaid Other | Source: Ambulatory Visit | Attending: Nurse Practitioner | Admitting: Nurse Practitioner

## 2017-05-25 ENCOUNTER — Other Ambulatory Visit: Payer: Self-pay | Admitting: Nurse Practitioner

## 2017-05-25 DIAGNOSIS — R1084 Generalized abdominal pain: Secondary | ICD-10-CM | POA: Diagnosis present

## 2017-05-25 MED ORDER — IOPAMIDOL (ISOVUE-300) INJECTION 61%
100.0000 mL | Freq: Once | INTRAVENOUS | Status: AC | PRN
  Administered 2017-05-25: 100 mL via INTRAVENOUS

## 2017-05-25 NOTE — Telephone Encounter (Signed)
I already prescribed this for her in march with 5 refills. Is she in need of a refill already?

## 2017-05-25 NOTE — Telephone Encounter (Signed)
Patient was advised earlier and was picking the rx up from the pharmacy.

## 2017-05-25 NOTE — Telephone Encounter (Signed)
Patient saw Denice Paradise today. Patient states she was dx with IBS. Patient would like Dr. Caryn Section to read her notes from visit. Patient states Denice Paradise advised her to request hyoscyamine 0.125 mg sublingual prescription from Dr. Caryn Section.

## 2017-05-27 ENCOUNTER — Ambulatory Visit: Payer: Medicaid Other | Admitting: Gastroenterology

## 2017-06-18 ENCOUNTER — Ambulatory Visit: Payer: Medicaid Other | Admitting: Family Medicine

## 2017-06-22 ENCOUNTER — Ambulatory Visit: Payer: Medicaid Other | Admitting: Family Medicine

## 2017-06-22 ENCOUNTER — Encounter: Payer: Self-pay | Admitting: Physician Assistant

## 2017-06-22 ENCOUNTER — Ambulatory Visit: Payer: Medicaid Other | Admitting: Physician Assistant

## 2017-06-22 ENCOUNTER — Other Ambulatory Visit: Payer: Self-pay

## 2017-06-22 VITALS — BP 104/56 | HR 72 | Temp 98.3°F | Resp 16 | Wt 126.0 lb

## 2017-06-22 DIAGNOSIS — J302 Other seasonal allergic rhinitis: Secondary | ICD-10-CM

## 2017-06-22 DIAGNOSIS — J029 Acute pharyngitis, unspecified: Secondary | ICD-10-CM | POA: Diagnosis not present

## 2017-06-22 DIAGNOSIS — J301 Allergic rhinitis due to pollen: Secondary | ICD-10-CM

## 2017-06-22 MED ORDER — AMOXICILLIN-POT CLAVULANATE 875-125 MG PO TABS: 1.0000 | ORAL_TABLET | Freq: Two times a day (BID) | ORAL | 0 refills | Status: AC

## 2017-06-22 NOTE — Patient Instructions (Signed)

## 2017-06-22 NOTE — Progress Notes (Signed)
Patient: Erin Richards Female    DOB: 1965/09/08   52 y.o.   MRN: [id number] Visit Date: 06/22/2017  Today's Provider: Trinna Post, PA-C   Chief Complaint  Patient presents with  . Sore Throat    Started a few days ago   Subjective:    Erin Richards is a patient with congenital heart disease presenting with sore throat x 2 days. She says she her heart doctor told her to come here when her mucous is green because of her heart condition.   Sore Throat   This is a new problem. The current episode started in the past 7 days. The problem has been gradually worsening. Neither side of throat is experiencing more pain than the other. There has been no fever. The pain is severe. Associated symptoms include coughing, a hoarse voice, a plugged ear sensation and trouble swallowing. Pertinent negatives include no abdominal pain, congestion, diarrhea, drooling, ear discharge, ear pain, headaches, neck pain, shortness of breath, stridor, swollen glands or vomiting.       Allergies  Allergen Reactions  . Acetaminophen-Codeine Nausea And Vomiting  . Antiseptic Oral Rinse  [Cetylpyridinium Chloride] Other (See Comments)    UNK  . Aspartame Other (See Comments)    Reaction: unknown  . Biaxin [Clarithromycin] Nausea And Vomiting  . Carafate [Sucralfate]     Pt says her "Stomach hurts" when she takes it.    . Chlorhexidine Gluconate Nausea And Vomiting  . Clindamycin/Lincomycin Nausea And Vomiting  . Codeine Itching and Nausea And Vomiting  . Dextromethorphan Hbr Other (See Comments)    Reaction: unknown  . Dilaudid [Hydromorphone Hcl] Nausea And Vomiting  . Doxycycline Nausea And Vomiting  . Fentanyl Nausea And Vomiting  . Germanium Other (See Comments)  . Hydrocodone Nausea And Vomiting  . Ketorolac Other (See Comments)  . Levofloxacin Other (See Comments)    GI upset. GI upset  . Mefenamic Acid Nausea And Vomiting  . Metformin And Related Nausea And Vomiting  .  Metronidazole Diarrhea and Nausea And Vomiting  . Morphine And Related Nausea And Vomiting  . Moxifloxacin Swelling  . Nitrofurantoin Nausea And Vomiting and Other (See Comments)  . Nsaids Other (See Comments)    Reaction: unknown  . Oxycodone-Acetaminophen Nausea And Vomiting  . Periguard [Dimethicone] Nausea And Vomiting  . Phenothiazines Nausea And Vomiting  . Pioglitazone Nausea And Vomiting  . Quinidine Nausea And Vomiting  . Quinolones Nausea And Vomiting  . Rumex Crispus Other (See Comments)  . Tetracyclines & Related Nausea And Vomiting  . Toradol [Ketorolac Tromethamine] Nausea And Vomiting  . Tramadol Nausea And Vomiting  . Tussin [Guaifenesin] Nausea And Vomiting  . Tussionex Pennkinetic Er [Hydrocod Polst-Cpm Polst Er] Nausea And Vomiting  . Buprenorphine Hcl Nausea And Vomiting  . Lincomycin Hcl Nausea And Vomiting  . Oxycodone-Acetaminophen Hives and Nausea And Vomiting    Other reaction(s): Nausea And Vomiting, Unknown  . Phenylalanine Nausea And Vomiting     Current Outpatient Medications:  .  albuterol (PROAIR HFA) 108 (90 Base) MCG/ACT inhaler, Inhale 1-2 puffs into the lungs every 6 (six) hours as needed for shortness of breath., Disp: 8.5 Inhaler, Rfl: 5 .  azelastine (ASTELIN) 0.1 % nasal spray, Place 1 spray into both nostrils 2 (two) times daily., Disp: 30 mL, Rfl: 12 .  benzonatate (TESSALON PERLES) 100 MG capsule, Take 2 capsules (200 mg total) by mouth 3 (three) times daily., Disp: 90 capsule, Rfl: 2 .  celecoxib (CELEBREX) 200 MG capsule, TAKE 1 CAPSULE (200 MG TOTAL) 2 (TWO) TIMES DAILY BY MOUTH., Disp: 60 capsule, Rfl: 6 .  cetirizine (ZYRTEC) 10 MG tablet, Take 1 tablet (10 mg total) by mouth daily., Disp: 30 tablet, Rfl: 11 .  citalopram (CELEXA) 20 MG tablet, TAKE 0.5 TABLETS (10 MG TOTAL) BY MOUTH DAILY., Disp: 15 tablet, Rfl: 4 .  COMBIVENT RESPIMAT 20-100 MCG/ACT AERS respimat, INHALE 1 PUFF INTO THE LUNGS EVERY 4 (FOUR) HOURS AS NEEDED FOR  WHEEZING., Disp: 1 Inhaler, Rfl: 5 .  estradiol (ESTRACE) 0.5 MG tablet, Take 1 tablet (0.5 mg total) by mouth daily., Disp: 30 tablet, Rfl: 5 .  fexofenadine (ALLEGRA) 60 MG tablet, Take 1 tablet (60 mg total) by mouth 2 (two) times daily., Disp: 60 tablet, Rfl: 4 .  fluticasone (FLONASE) 50 MCG/ACT nasal spray, Place 2 sprays into both nostrils daily as needed for allergies or rhinitis. Reported on 04/03/2015, Disp: 16 g, Rfl: 5 .  Fluticasone-Salmeterol (ADVAIR DISKUS) 250-50 MCG/DOSE AEPB, Inhale 1 puff into the lungs 2 (two) times daily. Rinse mouth after use., Disp: 1 each, Rfl: 1 .  gabapentin (NEURONTIN) 300 MG capsule, TAKE 1 CAPSULE (300 MG TOTAL) BY MOUTH DAILY., Disp: 30 capsule, Rfl: 11 .  hyoscyamine (LEVSIN, ANASPAZ) 0.125 MG tablet, TAKE ONE TABLET BY MOUTH EVERY 6 HOURS AS NEEDED FOR CRAMPING FOR UP TO 10 DAYS, Disp: 30 tablet, Rfl: 5 .  ipratropium (ATROVENT) 0.03 % nasal spray, Place 2 sprays into the nose daily as needed., Disp: 30 mL, Rfl: 3 .  loperamide (IMODIUM A-D) 2 MG tablet, Take 1 tablet (2 mg total) by mouth 4 (four) times daily as needed for diarrhea or loose stools., Disp: 30 tablet, Rfl: 0 .  montelukast (SINGULAIR) 10 MG tablet, TAKE 1 TABLET (10 MG TOTAL) BY MOUTH AT BEDTIME., Disp: 30 tablet, Rfl: 12 .  nortriptyline (PAMELOR) 50 MG capsule, Take 1 capsule (50 mg total) by mouth daily as needed (headache)., Disp: 30 capsule, Rfl: 5 .  ondansetron (ZOFRAN ODT) 4 MG disintegrating tablet, Take 1 tablet (4 mg total) by mouth every 6 (six) hours as needed for nausea or vomiting., Disp: 20 tablet, Rfl: 0 .  pantoprazole (PROTONIX) 40 MG tablet, TAKE 1 TABLET BY MOUTH TWICE A DAY, Disp: 60 tablet, Rfl: 5 .  ranitidine (ZANTAC) 150 MG tablet, TAKE 1 TABLET BY MOUTH TWICE A DAY, Disp: 30 tablet, Rfl: 5  Review of Systems  Constitutional: Positive for fatigue. Negative for activity change, appetite change, chills, diaphoresis, fever and unexpected weight change.  HENT:  Positive for hoarse voice, postnasal drip, rhinorrhea, sinus pressure, sinus pain, sore throat and trouble swallowing. Negative for congestion, drooling, ear discharge and ear pain.   Respiratory: Positive for cough. Negative for apnea, choking, chest tightness, shortness of breath, wheezing and stridor.   Gastrointestinal: Negative.  Negative for abdominal pain, diarrhea and vomiting.  Musculoskeletal: Negative for neck pain.  Neurological: Negative for dizziness, light-headedness and headaches.    Social History   Tobacco Use  . Smoking status: Never Smoker  . Smokeless tobacco: Never Used  Substance Use Topics  . Alcohol use: No   Objective:   BP (!) 104/56 (BP Location: Right Arm, Patient Position: Sitting, Cuff Size: Normal)   Pulse 72   Temp 98.3 F (36.8 C) (Oral)   Resp 16   Wt 126 lb (57.2 kg)   BMI 27.27 kg/m  Vitals:   06/22/17 1208  BP: (!) 104/56  Pulse: 72  Resp: 16  Temp: 98.3 F (36.8 C)  TempSrc: Oral  Weight: 126 lb (57.2 kg)     Physical Exam  Constitutional: She appears well-developed and well-nourished.  HENT:  Right Ear: External ear normal.  Left Ear: External ear normal.  Mouth/Throat: Oropharynx is clear and moist. No oropharyngeal exudate.  Eyes: Right eye exhibits discharge. Left eye exhibits discharge.  Neck: Neck supple.  Cardiovascular: Normal rate and regular rhythm.  Pulmonary/Chest: Effort normal and breath sounds normal. No respiratory distress. She has no rales.  Lymphadenopathy:    She has no cervical adenopathy.  Skin: Skin is warm and dry.  Psychiatric: She has a normal mood and affect. Her behavior is normal.        Assessment & Plan:     1. Sore throat  I have told her explicitly I think this is a virus. She then says she has green mucous and heart doctor told her to come for that. I have explained green mucous is also cause by virus. Hoarse voice and sore throat are indicative laryngitis, which is viral, especially  considering 2 days of duration. Had Augmentin one month ago. Then she says it's been going on for 7 days. She says she doesn't want to get sicker. She continues to insist on antibiotic even though I have told her I think it is viral and that she should wait, antibiotics will not prevent this. Antibiotic given solely at patient request. She says the "normal" amoxicillin gives her a yeast infection and she needs Augmentin.  - amoxicillin-clavulanate (AUGMENTIN) 875-125 MG tablet; Take 1 tablet by mouth 2 (two) times daily for 5 days.  Dispense: 10 tablet; Refill: 0  Return if symptoms worsen or fail to improve.  The entirety of the information documented in the History of Present Illness, Review of Systems and Physical Exam were personally obtained by me. Portions of this information were initially documented by Ashley Royalty, CMA and reviewed by me for thoroughness and accuracy.        Trinna Post, PA-C  [facility]

## 2017-06-23 MED ORDER — FLUTICASONE PROPIONATE 50 MCG/ACT NA SUSP: 2.0000 | Freq: Every day | NASAL | 5 refills | Status: DC | PRN

## 2017-06-23 MED ORDER — IPRATROPIUM BROMIDE 0.03 % NA SOLN
2.0000 | Freq: Every day | NASAL | 3 refills | Status: DC | PRN
Start: 2017-06-23 — End: 2018-06-28

## 2017-06-23 MED ORDER — AZELASTINE HCL 0.1 % NA SOLN: 1.0000 | Freq: Two times a day (BID) | NASAL | 12 refills | Status: DC

## 2017-07-12 ENCOUNTER — Ambulatory Visit: Payer: Medicaid Other | Admitting: Family Medicine

## 2017-07-19 NOTE — Progress Notes (Deleted)
[facility] Consultation      Assessment and Plan:  Acute on chronic bronchitis. -There is evidence of chronic bronchitis recent chest x-ray. -Patient already has a prescription for Combivent to be used as needed.  Acute asthmatic bronchitis with intractable cough. -Patient currently has an episode of acute asthmatic bronchitis, likely postinfectious with persistent intractable cough, chest congestion. - She is given a prescription for Advair, to be used until the cough has resolved.  I have also given her a prescription for Tessalon, she tells me she has taken this previously and does not have an allergic reaction to it.  Hemoptysis. -Patient tells me she had a few episodes of some blood mixed in with sputum. -Suspect that this is secondary to severe cough, however if persistent next visit will consider getting a CT scan of the chest.   Date: 07/19/2017  MRN# [id number] Erin Richards 06-25-1965  Referring Physician: self.   Erin Richards is a 52 y.o. old female seen in consultation for chief complaint of:    No chief complaint on file.   HPI:  The patient is a 52 year old female, last visit she had an episode of hemoptysis, as well as persistent intractable cough complicating a recent URTI.  It was thought that this represented a postinfectious cough/asthmatic bronchitis.  She was given a course of prednisone, Tessalon.  She also had a prescription already for Combivent, and asked to use it as needed.  She has a dog that sleeps in bed with her.  She had allergy tested and was positive for cats, mold, mildew.   She has used tessalon in the past without a reaction.  Imaging personally reviewed chest x-ray 05/09/17, chest x-ray shows changes of chronic bronchitis, lungs are otherwise unremarkable.  Social Hx:   Social History   Tobacco Use  . Smoking status: Never Smoker  . Smokeless tobacco: Never Used  Substance Use Topics  . Alcohol use: No  . Drug  use: No   Medication:    Current Outpatient Medications:  .  albuterol (PROAIR HFA) 108 (90 Base) MCG/ACT inhaler, Inhale 1-2 puffs into the lungs every 6 (six) hours as needed for shortness of breath., Disp: 8.5 Inhaler, Rfl: 5 .  azelastine (ASTELIN) 0.1 % nasal spray, Place 1 spray into both nostrils 2 (two) times daily., Disp: 30 mL, Rfl: 12 .  benzonatate (TESSALON PERLES) 100 MG capsule, Take 2 capsules (200 mg total) by mouth 3 (three) times daily., Disp: 90 capsule, Rfl: 2 .  celecoxib (CELEBREX) 200 MG capsule, TAKE 1 CAPSULE (200 MG TOTAL) 2 (TWO) TIMES DAILY BY MOUTH., Disp: 60 capsule, Rfl: 6 .  cetirizine (ZYRTEC) 10 MG tablet, Take 1 tablet (10 mg total) by mouth daily., Disp: 30 tablet, Rfl: 11 .  citalopram (CELEXA) 20 MG tablet, TAKE 0.5 TABLETS (10 MG TOTAL) BY MOUTH DAILY., Disp: 15 tablet, Rfl: 4 .  COMBIVENT RESPIMAT 20-100 MCG/ACT AERS respimat, INHALE 1 PUFF INTO THE LUNGS EVERY 4 (FOUR) HOURS AS NEEDED FOR WHEEZING., Disp: 1 Inhaler, Rfl: 5 .  estradiol (ESTRACE) 0.5 MG tablet, Take 1 tablet (0.5 mg total) by mouth daily., Disp: 30 tablet, Rfl: 5 .  fexofenadine (ALLEGRA) 60 MG tablet, Take 1 tablet (60 mg total) by mouth 2 (two) times daily., Disp: 60 tablet, Rfl: 4 .  fluticasone (FLONASE) 50 MCG/ACT nasal spray, Place 2 sprays into both nostrils daily as needed for allergies or rhinitis. Reported on 04/03/2015, Disp: 16 g, Rfl: 5 .  Fluticasone-Salmeterol (  ADVAIR DISKUS) 250-50 MCG/DOSE AEPB, Inhale 1 puff into the lungs 2 (two) times daily. Rinse mouth after use., Disp: 1 each, Rfl: 1 .  gabapentin (NEURONTIN) 300 MG capsule, TAKE 1 CAPSULE (300 MG TOTAL) BY MOUTH DAILY., Disp: 30 capsule, Rfl: 11 .  hyoscyamine (LEVSIN, ANASPAZ) 0.125 MG tablet, TAKE ONE TABLET BY MOUTH EVERY 6 HOURS AS NEEDED FOR CRAMPING FOR UP TO 10 DAYS, Disp: 30 tablet, Rfl: 5 .  ipratropium (ATROVENT) 0.03 % nasal spray, Place 2 sprays into the nose daily as needed., Disp: 30 mL, Rfl: 3 .   loperamide (IMODIUM A-D) 2 MG tablet, Take 1 tablet (2 mg total) by mouth 4 (four) times daily as needed for diarrhea or loose stools., Disp: 30 tablet, Rfl: 0 .  montelukast (SINGULAIR) 10 MG tablet, TAKE 1 TABLET (10 MG TOTAL) BY MOUTH AT BEDTIME., Disp: 30 tablet, Rfl: 12 .  nortriptyline (PAMELOR) 50 MG capsule, Take 1 capsule (50 mg total) by mouth daily as needed (headache)., Disp: 30 capsule, Rfl: 5 .  ondansetron (ZOFRAN ODT) 4 MG disintegrating tablet, Take 1 tablet (4 mg total) by mouth every 6 (six) hours as needed for nausea or vomiting., Disp: 20 tablet, Rfl: 0 .  pantoprazole (PROTONIX) 40 MG tablet, TAKE 1 TABLET BY MOUTH TWICE A DAY, Disp: 60 tablet, Rfl: 5 .  ranitidine (ZANTAC) 150 MG tablet, TAKE 1 TABLET BY MOUTH TWICE A DAY, Disp: 30 tablet, Rfl: 5   Allergies:  Acetaminophen-codeine; Antiseptic oral rinse  [cetylpyridinium chloride]; Aspartame; Biaxin [clarithromycin]; Carafate [sucralfate]; Chlorhexidine gluconate; Clindamycin/lincomycin; Codeine; Dextromethorphan hbr; Dilaudid [hydromorphone hcl]; Doxycycline; Fentanyl; Germanium; Hydrocodone; Ketorolac; Levofloxacin; Mefenamic acid; Metformin and related; Metronidazole; Morphine and related; Moxifloxacin; Nitrofurantoin; Nsaids; Oxycodone-acetaminophen; Periguard [dimethicone]; Phenothiazines; Pioglitazone; Quinidine; Quinolones; Rumex crispus; Tetracyclines & related; Toradol [ketorolac tromethamine]; Tramadol; Tussin [guaifenesin]; Tussionex pennkinetic er [employer] er]; Buprenorphine hcl; Lincomycin hcl; Oxycodone-acetaminophen; and Phenylalanine  Review of Systems: Gen:  Denies  fever, sweats, chills HEENT: Denies blurred vision, double vision. bleeds, sore throat Cvc:  No dizziness, chest pain. Resp:   Denies cough or sputum production, shortness of breath Gi: Denies swallowing difficulty, stomach pain. Gu:  Denies bladder incontinence, burning urine Ext:   No Joint pain, stiffness. Skin: No skin rash,   hives  Endoc:  No polyuria, polydipsia. Psych: No depression, insomnia. Other:  All other systems were reviewed with the patient and were negative other that what is mentioned in the HPI.   Physical Examination:   VS: There were no vitals taken for this visit.  General Appearance: No distress  Neuro:without focal findings,  speech normal,  HEENT: PERRLA, EOM intact.   Pulmonary: normal breath sounds, No wheezing.  CardiovascularNormal S1,S2.  No m/r/g.   Abdomen: Benign, Soft, non-tender. Renal:  No costovertebral tenderness  GU:  No performed at this time. Endoc: No evident thyromegaly, no signs of acromegaly. Skin:   warm, no rashes, no ecchymosis  Extremities: normal, no cyanosis, clubbing.  Other findings:    LABORATORY PANEL:   CBC No results for input(s): WBC, HGB, HCT, PLT in the last 168 hours. ------------------------------------------------------------------------------------------------------------------  Chemistries  No results for input(s): NA, K, CL, CO2, GLUCOSE, BUN, CREATININE, CALCIUM, MG, AST, ALT, ALKPHOS, BILITOT in the last 168 hours.  Invalid input(s): GFRCGP ------------------------------------------------------------------------------------------------------------------  Cardiac Enzymes No results for input(s): TROPONINI in the last 168 hours. ------------------------------------------------------------  RADIOLOGY:  No results found.     Thank  you for the consultation and for allowing [facility] to assist  in the care of your patient. Our recommendations are noted above.  Please contact us if we can be of further service.   Marda Stalker, MD.  Board Certified in Internal Medicine, Pulmonary Medicine, [place], and Sleep Medicine.  [facility] Office Number: [phone]  Patricia Pesa, M.D.  Merton Border, M.D  07/19/2017

## 2017-07-20 ENCOUNTER — Ambulatory Visit: Payer: Medicaid Other | Admitting: Internal Medicine

## 2017-07-21 ENCOUNTER — Encounter: Payer: Self-pay | Admitting: Internal Medicine

## 2017-07-21 ENCOUNTER — Ambulatory Visit: Payer: Medicaid Other | Admitting: Internal Medicine

## 2017-07-26 NOTE — Progress Notes (Signed)
[facility] Consultation      Assessment and Plan:  Chronic bronchitis with chronic cough.  -Explained that she has chronic bronchitis with excess mucus production. Explained that this will be a nuisance but will not cause damage to her lungs. Due to her multiple medication allergies, I would not treat this empirically.  --Will check a sputum culture to ensure that there is no infection.  -Explained that the treatment of cough is empiric she cannot take over-the-counter cough medicines, Tessalon did not help. - Explained that the treatment for chronic bronchitis is anticholinergic inhaler, which we will start.  Hemoptysis. -Resolved.   Orders Placed This Encounter  Procedures  . Culture, expectorated sputum-assessment   Meds ordered this encounter  Medications  . tiotropium (SPIRIVA) 18 MCG inhalation capsule    Sig: Place 1 capsule (18 mcg total) into inhaler and inhale daily.    Dispense:  30 capsule    Refill:  12   Return if symptoms worsen or fail to improve.    Date: 07/26/2017  MRN# [id number] Erin Richards September 19, 1965  Referring Physician: self.   Erin Richards is a 52 y.o. old female seen in consultation for chief complaint of:    Chief Complaint  Patient presents with  . Bronchitis    pt d/c Advair due to blisters in mouth  . Cough    green mucus, denies sob.    HPI:  The patient is a 52 year old female, last visit she had an episode of hemoptysis, as well as persistent intractable cough complicating a recent URTI.  It was thought that this represented a postinfectious cough/asthmatic bronchitis.  She was given a course of prednisone, Tessalon.  Last visit she was given a prescription for Advair, and Tessalon she stopped this due to development of blisters. She completed the tessalon, she thinked it helped a bit.  She is not coughing up any blood, now her sputum is green and feels she has to cough several times to get it all up. She  continues to have occasional cough and when she coughs a lot it makes her tired.   She has a dog that sleeps in bed with her.  She had allergy tested and was positive for cats, mold, mildew.   She has used tessalon in the past without a reaction.  Imaging personally reviewed chest x-ray 05/09/17, chest x-ray shows changes of chronic bronchitis, lungs are otherwise unremarkable.  Social Hx:   Social History   Tobacco Use  . Smoking status: Never Smoker  . Smokeless tobacco: Never Used  Substance Use Topics  . Alcohol use: No  . Drug use: No   Medication:    Current Outpatient Medications:  .  albuterol (PROAIR HFA) 108 (90 Base) MCG/ACT inhaler, Inhale 1-2 puffs into the lungs every 6 (six) hours as needed for shortness of breath., Disp: 8.5 Inhaler, Rfl: 5 .  azelastine (ASTELIN) 0.1 % nasal spray, Place 1 spray into both nostrils 2 (two) times daily., Disp: 30 mL, Rfl: 12 .  benzonatate (TESSALON PERLES) 100 MG capsule, Take 2 capsules (200 mg total) by mouth 3 (three) times daily., Disp: 90 capsule, Rfl: 2 .  celecoxib (CELEBREX) 200 MG capsule, TAKE 1 CAPSULE (200 MG TOTAL) 2 (TWO) TIMES DAILY BY MOUTH., Disp: 60 capsule, Rfl: 6 .  cetirizine (ZYRTEC) 10 MG tablet, Take 1 tablet (10 mg total) by mouth daily., Disp: 30 tablet, Rfl: 11 .  citalopram (CELEXA) 20 MG tablet, TAKE 0.5 TABLETS (10 MG  TOTAL) BY MOUTH DAILY., Disp: 15 tablet, Rfl: 4 .  COMBIVENT RESPIMAT 20-100 MCG/ACT AERS respimat, INHALE 1 PUFF INTO THE LUNGS EVERY 4 (FOUR) HOURS AS NEEDED FOR WHEEZING., Disp: 1 Inhaler, Rfl: 5 .  estradiol (ESTRACE) 0.5 MG tablet, Take 1 tablet (0.5 mg total) by mouth daily., Disp: 30 tablet, Rfl: 5 .  fexofenadine (ALLEGRA) 60 MG tablet, Take 1 tablet (60 mg total) by mouth 2 (two) times daily., Disp: 60 tablet, Rfl: 4 .  fluticasone (FLONASE) 50 MCG/ACT nasal spray, Place 2 sprays into both nostrils daily as needed for allergies or rhinitis. Reported on 04/03/2015, Disp: 16 g, Rfl: 5 .   Fluticasone-Salmeterol (ADVAIR DISKUS) 250-50 MCG/DOSE AEPB, Inhale 1 puff into the lungs 2 (two) times daily. Rinse mouth after use., Disp: 1 each, Rfl: 1 .  gabapentin (NEURONTIN) 300 MG capsule, TAKE 1 CAPSULE (300 MG TOTAL) BY MOUTH DAILY., Disp: 30 capsule, Rfl: 11 .  hyoscyamine (LEVSIN, ANASPAZ) 0.125 MG tablet, TAKE ONE TABLET BY MOUTH EVERY 6 HOURS AS NEEDED FOR CRAMPING FOR UP TO 10 DAYS, Disp: 30 tablet, Rfl: 5 .  ipratropium (ATROVENT) 0.03 % nasal spray, Place 2 sprays into the nose daily as needed., Disp: 30 mL, Rfl: 3 .  loperamide (IMODIUM A-D) 2 MG tablet, Take 1 tablet (2 mg total) by mouth 4 (four) times daily as needed for diarrhea or loose stools., Disp: 30 tablet, Rfl: 0 .  montelukast (SINGULAIR) 10 MG tablet, TAKE 1 TABLET (10 MG TOTAL) BY MOUTH AT BEDTIME., Disp: 30 tablet, Rfl: 12 .  nortriptyline (PAMELOR) 50 MG capsule, Take 1 capsule (50 mg total) by mouth daily as needed (headache)., Disp: 30 capsule, Rfl: 5 .  ondansetron (ZOFRAN ODT) 4 MG disintegrating tablet, Take 1 tablet (4 mg total) by mouth every 6 (six) hours as needed for nausea or vomiting., Disp: 20 tablet, Rfl: 0 .  pantoprazole (PROTONIX) 40 MG tablet, TAKE 1 TABLET BY MOUTH TWICE A DAY, Disp: 60 tablet, Rfl: 5 .  ranitidine (ZANTAC) 150 MG tablet, TAKE 1 TABLET BY MOUTH TWICE A DAY, Disp: 30 tablet, Rfl: 5   Allergies:  Acetaminophen-codeine; Antiseptic oral rinse  [cetylpyridinium chloride]; Aspartame; Biaxin [clarithromycin]; Carafate [sucralfate]; Chlorhexidine gluconate; Clindamycin/lincomycin; Codeine; Dextromethorphan hbr; Dilaudid [hydromorphone hcl]; Doxycycline; Fentanyl; Germanium; Hydrocodone; Ketorolac; Levofloxacin; Mefenamic acid; Metformin and related; Metronidazole; Morphine and related; Moxifloxacin; Nitrofurantoin; Nsaids; Oxycodone-acetaminophen; Periguard [dimethicone]; Phenothiazines; Pioglitazone; Quinidine; Quinolones; Rumex crispus; Tetracyclines & related; Toradol [ketorolac  tromethamine]; Tramadol; Tussin [guaifenesin]; Tussionex pennkinetic er [employer] er]; Buprenorphine hcl; Lincomycin hcl; Oxycodone-acetaminophen; and Phenylalanine  Review of Systems: Gen:  Denies  fever, sweats, chills HEENT: Denies blurred vision, double vision. bleeds, sore throat Cvc:  No dizziness, chest pain. Resp:   Denies cough or sputum production. Gi: Denies swallowing difficulty, stomach pain. Gu:  Denies bladder incontinence, burning urine Ext:   No Joint pain, stiffness. Skin: No skin rash,  hives  Endoc:  No polyuria, polydipsia. Psych: No depression, insomnia. Other:  All other systems were reviewed with the patient and were negative other that what is mentioned in the HPI.   Physical Examination:   VS: There were no vitals taken for this visit.  General Appearance: No distress  Neuro:without focal findings,  speech normal,  HEENT: PERRLA, EOM intact.   Pulmonary: normal breath sounds, No wheezing.  CardiovascularNormal S1,S2.  No m/r/g.   Abdomen: Benign, Soft, non-tender. Renal:  No costovertebral tenderness  GU:  No performed at this time. Endoc: No evident thyromegaly, no signs of  acromegaly. Skin:   warm, no rashes, no ecchymosis  Extremities: normal, no cyanosis, clubbing.  Other findings:    LABORATORY PANEL:   CBC No results for input(s): WBC, HGB, HCT, PLT in the last 168 hours. ------------------------------------------------------------------------------------------------------------------  Chemistries  No results for input(s): NA, K, CL, CO2, GLUCOSE, BUN, CREATININE, CALCIUM, MG, AST, ALT, ALKPHOS, BILITOT in the last 168 hours.  Invalid input(s): GFRCGP ------------------------------------------------------------------------------------------------------------------  Cardiac Enzymes No results for input(s): TROPONINI in the last 168 hours. ------------------------------------------------------------  RADIOLOGY:  No  results found.     Thank  you for the consultation and for allowing [facility] to assist in the care of your patient. Our recommendations are noted above.  Please contact us if we can be of further service.   Marda Stalker, MD.  Board Certified in Internal Medicine, Pulmonary Medicine, [place], and Sleep Medicine.  [facility] Office Number: [phone]  Patricia Pesa, M.D.  Merton Border, M.D  07/26/2017

## 2017-07-27 ENCOUNTER — Ambulatory Visit (INDEPENDENT_AMBULATORY_CARE_PROVIDER_SITE_OTHER): Payer: Medicaid Other | Admitting: Internal Medicine

## 2017-07-27 ENCOUNTER — Encounter: Payer: Self-pay | Admitting: Internal Medicine

## 2017-07-27 VITALS — BP 106/60 | HR 71 | Resp 16 | Ht <= 58 in | Wt 130.0 lb

## 2017-07-27 DIAGNOSIS — J440 Chronic obstructive pulmonary disease with acute lower respiratory infection: Secondary | ICD-10-CM

## 2017-07-27 DIAGNOSIS — J209 Acute bronchitis, unspecified: Secondary | ICD-10-CM

## 2017-07-27 MED ORDER — TIOTROPIUM BROMIDE MONOHYDRATE 18 MCG IN CAPS: 18.0000 ug | ORAL_CAPSULE | Freq: Every day | RESPIRATORY_TRACT | 12 refills | Status: DC

## 2017-07-27 NOTE — Patient Instructions (Signed)
Will check a sputum culture.  Will start spiriva.  Call us back if you develop bloody cough.

## 2017-08-03 ENCOUNTER — Ambulatory Visit: Payer: Medicaid Other | Admitting: Family Medicine

## 2017-08-07 ENCOUNTER — Other Ambulatory Visit: Payer: Self-pay | Admitting: Family Medicine

## 2017-08-07 DIAGNOSIS — K219 Gastro-esophageal reflux disease without esophagitis: Secondary | ICD-10-CM

## 2017-08-10 ENCOUNTER — Other Ambulatory Visit: Payer: Self-pay | Admitting: Family Medicine

## 2017-08-10 DIAGNOSIS — F321 Major depressive disorder, single episode, moderate: Secondary | ICD-10-CM

## 2017-08-22 ENCOUNTER — Other Ambulatory Visit: Payer: Self-pay | Admitting: Family Medicine

## 2017-08-22 DIAGNOSIS — R109 Unspecified abdominal pain: Secondary | ICD-10-CM

## 2017-08-23 ENCOUNTER — Ambulatory Visit: Payer: Medicaid Other | Admitting: Family Medicine

## 2017-08-23 ENCOUNTER — Encounter: Payer: Self-pay | Admitting: Family Medicine

## 2017-08-23 VITALS — BP 98/52 | HR 70 | Temp 97.7°F | Resp 16 | Wt 131.0 lb

## 2017-08-23 DIAGNOSIS — R590 Localized enlarged lymph nodes: Secondary | ICD-10-CM

## 2017-08-23 DIAGNOSIS — E8941 Symptomatic postprocedural ovarian failure: Secondary | ICD-10-CM | POA: Diagnosis not present

## 2017-08-23 NOTE — Progress Notes (Signed)
Patient: Erin Richards Female    DOB: 09/07/1965   52 y.o.   MRN: [id number] Visit Date: 08/23/2017  Today's Provider: Lavon Paganini, MD   Chief Complaint  Patient presents with  . Mass   Subjective:    HPI   Pt states she noticed a "lump" on er left inguinal region 4 days ago. She states the area is sore. She also noticed chills.   Pt states she has been on estradiol since having her hysterectomy. States her hysterectomy "has been a while ago", and is aware that this is not a long-term medication.  Was able to locate notes from OB/gyn from 2010 that show patient underwent supracervical abdominal hysterectomy with bilateral salpingo-oophorectomy for chronic pelvic pain and abnormal uterine bleeding.  It appears she has been on estradiol since that time.   Allergies  Allergen Reactions  . Acetaminophen-Codeine Nausea And Vomiting  . Advair Diskus [Fluticasone-Salmeterol] Other (See Comments)    Blister in mouth  . Antiseptic Oral Rinse  [Cetylpyridinium Chloride] Other (See Comments)    UNK  . Aspartame Other (See Comments)    Reaction: unknown  . Biaxin [Clarithromycin] Nausea And Vomiting  . Carafate [Sucralfate]     Pt says her "Stomach hurts" when she takes it.    . Chlorhexidine Gluconate Nausea And Vomiting  . Clindamycin/Lincomycin Nausea And Vomiting  . Codeine Itching and Nausea And Vomiting  . Dextromethorphan Hbr Other (See Comments)    Reaction: unknown  . Dilaudid [Hydromorphone Hcl] Nausea And Vomiting  . Doxycycline Nausea And Vomiting  . Fentanyl Nausea And Vomiting  . Germanium Other (See Comments)  . Hydrocodone Nausea And Vomiting  . Ketorolac Other (See Comments)  . Levofloxacin Other (See Comments)    GI upset. GI upset  . Mefenamic Acid Nausea And Vomiting  . Metformin And Related Nausea And Vomiting  . Metronidazole Diarrhea and Nausea And Vomiting  . Morphine And Related Nausea And Vomiting  . Moxifloxacin Swelling  .  Nitrofurantoin Nausea And Vomiting and Other (See Comments)  . Nsaids Other (See Comments)    Reaction: unknown  . Oxycodone-Acetaminophen Nausea And Vomiting  . Periguard [Dimethicone] Nausea And Vomiting  . Phenothiazines Nausea And Vomiting  . Pioglitazone Nausea And Vomiting  . Quinidine Nausea And Vomiting  . Quinolones Nausea And Vomiting  . Rumex Crispus Other (See Comments)  . Tetracyclines & Related Nausea And Vomiting  . Toradol [Ketorolac Tromethamine] Nausea And Vomiting  . Tramadol Nausea And Vomiting  . Tussin [Guaifenesin] Nausea And Vomiting  . Tussionex Pennkinetic Er [Hydrocod Polst-Cpm Polst Er] Nausea And Vomiting  . Buprenorphine Hcl Nausea And Vomiting  . Lincomycin Hcl Nausea And Vomiting  . Oxycodone-Acetaminophen Hives and Nausea And Vomiting    Other reaction(s): Nausea And Vomiting, Unknown  . Phenylalanine Nausea And Vomiting     Current Outpatient Medications:  .  albuterol (PROAIR HFA) 108 (90 Base) MCG/ACT inhaler, Inhale 1-2 puffs into the lungs every 6 (six) hours as needed for shortness of breath., Disp: 8.5 Inhaler, Rfl: 5 .  azelastine (ASTELIN) 0.1 % nasal spray, Place 1 spray into both nostrils 2 (two) times daily., Disp: 30 mL, Rfl: 12 .  cetirizine (ZYRTEC) 10 MG tablet, Take 1 tablet (10 mg total) by mouth daily., Disp: 30 tablet, Rfl: 11 .  citalopram (CELEXA) 20 MG tablet, TAKE 0.5 TABLETS (10 MG TOTAL) BY MOUTH DAILY., Disp: 15 tablet, Rfl: 2 .  COMBIVENT RESPIMAT 20-100 MCG/ACT AERS respimat, INHALE 1  PUFF INTO THE LUNGS EVERY 4 (FOUR) HOURS AS NEEDED FOR WHEEZING., Disp: 1 Inhaler, Rfl: 5 .  estradiol (ESTRACE) 0.5 MG tablet, Take 1 tablet (0.5 mg total) by mouth daily., Disp: 30 tablet, Rfl: 5 .  fexofenadine (ALLEGRA) 60 MG tablet, Take 1 tablet (60 mg total) by mouth 2 (two) times daily., Disp: 60 tablet, Rfl: 4 .  fluticasone (FLONASE) 50 MCG/ACT nasal spray, Place 2 sprays into both nostrils daily as needed for allergies or rhinitis.  Reported on 04/03/2015, Disp: 16 g, Rfl: 5 .  gabapentin (NEURONTIN) 300 MG capsule, TAKE 1 CAPSULE (300 MG TOTAL) BY MOUTH DAILY., Disp: 30 capsule, Rfl: 11 .  hyoscyamine (LEVSIN, ANASPAZ) 0.125 MG tablet, TAKE ONE TABLET BY MOUTH EVERY 6 HOURS AS NEEDED FOR CRAMPING FOR UP TO 10 DAYS, Disp: 30 tablet, Rfl: 5 .  ipratropium (ATROVENT) 0.03 % nasal spray, Place 2 sprays into the nose daily as needed., Disp: 30 mL, Rfl: 3 .  loperamide (IMODIUM A-D) 2 MG tablet, Take 1 tablet (2 mg total) by mouth 4 (four) times daily as needed for diarrhea or loose stools., Disp: 30 tablet, Rfl: 0 .  montelukast (SINGULAIR) 10 MG tablet, TAKE 1 TABLET (10 MG TOTAL) BY MOUTH AT BEDTIME., Disp: 30 tablet, Rfl: 12 .  nortriptyline (PAMELOR) 50 MG capsule, Take 1 capsule (50 mg total) by mouth daily as needed (headache)., Disp: 30 capsule, Rfl: 5 .  pantoprazole (PROTONIX) 40 MG tablet, TAKE 1 TABLET BY MOUTH TWICE A DAY, Disp: 60 tablet, Rfl: 5 .  tiotropium (SPIRIVA) 18 MCG inhalation capsule, Place 1 capsule (18 mcg total) into inhaler and inhale daily., Disp: 30 capsule, Rfl: 12 .  ranitidine (ZANTAC) 150 MG tablet, TAKE 1 TABLET BY MOUTH TWICE A DAY (Patient not taking: Reported on 08/23/2017), Disp: 30 tablet, Rfl: 5  Review of Systems  Constitutional: Positive for chills. Negative for activity change, appetite change, diaphoresis, fatigue, fever and unexpected weight change.  HENT: Negative.   Respiratory: Negative.   Cardiovascular: Negative for chest pain, palpitations and leg swelling.  Genitourinary: Negative.   Skin: Positive for wound.  Neurological: Negative.   Hematological: Negative.     Social History   Tobacco Use  . Smoking status: Never Smoker  . Smokeless tobacco: Never Used  Substance Use Topics  . Alcohol use: No   Objective:   BP (!) 98/52 (BP Location: Left Arm, Patient Position: Sitting, Cuff Size: Normal)   Pulse 70   Temp 97.7 F (36.5 C) (Oral)   Resp 16   Wt 131 lb (59.4  kg)   SpO2 99%   BMI 28.35 kg/m  Vitals:   08/23/17 1323  BP: (!) 98/52  Pulse: 70  Resp: 16  Temp: 97.7 F (36.5 C)  TempSrc: Oral  SpO2: 99%  Weight: 131 lb (59.4 kg)     Physical Exam  Constitutional: She is oriented to person, place, and time. She appears well-developed and well-nourished. No distress.  HENT:  Head: Normocephalic and atraumatic.  Eyes: Conjunctivae are normal. No scleral icterus.  Cardiovascular: Normal rate and regular rhythm.  Pulmonary/Chest: Effort normal and breath sounds normal. No respiratory distress.  Abdominal: Soft. She exhibits no distension. There is no tenderness.  Musculoskeletal: She exhibits no edema.  Lymphadenopathy:    She has no cervical adenopathy.       Right: No inguinal adenopathy present.       Left: Inguinal (1 shotty, <75mm node that is TTP and mobile.  No surrounding erythema)  adenopathy present.  Neurological: She is alert and oriented to person, place, and time.  Skin: Skin is warm and dry. Capillary refill takes less than 2 seconds. No rash noted.  Psychiatric: She has a normal mood and affect. Her behavior is normal.  Vitals reviewed.       Assessment & Plan:   1. Lymphadenopathy, inguinal - shotty LAD of L inguinal region without any evidence of infection - suspect benign shotty node that will be self-limited -Advised patient to avoid wearing clothing that presses on the area and irritates it -We will monitor for any signs of growth or infection -If not resolved in about 4 weeks, consider ultrasound to further evaluate  2. 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  She states she is using Advil intermittently.  She wonders if she can get a lidocaine patch.  Discussed that she should try Aleve twice daily consistently.   Return in about 2 months (around 10/24/2017) for CPE.   The entirety of the information documented in the History of Present Illness, Review of Systems and Physical Exam were personally obtained by me. Portions of this information were initially documented by Raquel Sarna Ratchford, CMA and reviewed by me for thoroughness and accuracy.    Virginia Crews, MD, MPH [facility] 08/23/2017 2:06 PM

## 2017-08-27 ENCOUNTER — Encounter
Admission: RE | Disposition: A | Discharge status: Home / Self Care | Payer: Self-pay | Source: Ambulatory Visit | Attending: Unknown Physician Specialty

## 2017-08-27 ENCOUNTER — Ambulatory Visit: Payer: Medicaid Other | Admitting: Anesthesiology

## 2017-08-27 ENCOUNTER — Encounter: Payer: Self-pay | Admitting: Anesthesiology

## 2017-08-27 ENCOUNTER — Ambulatory Visit
Admission: RE | Admit: 2017-08-27 | Discharge: 2017-08-27 | Disposition: A | Discharge status: Home / Self Care | Payer: Medicaid Other | Source: Ambulatory Visit | Attending: Unknown Physician Specialty | Admitting: Unknown Physician Specialty

## 2017-08-27 DIAGNOSIS — F329 Major depressive disorder, single episode, unspecified: Secondary | ICD-10-CM | POA: Insufficient documentation

## 2017-08-27 DIAGNOSIS — K219 Gastro-esophageal reflux disease without esophagitis: Secondary | ICD-10-CM | POA: Diagnosis not present

## 2017-08-27 DIAGNOSIS — K589 Irritable bowel syndrome without diarrhea: Secondary | ICD-10-CM | POA: Insufficient documentation

## 2017-08-27 DIAGNOSIS — Z79899 Other long term (current) drug therapy: Secondary | ICD-10-CM | POA: Diagnosis not present

## 2017-08-27 DIAGNOSIS — Z7951 Long term (current) use of inhaled steroids: Secondary | ICD-10-CM | POA: Insufficient documentation

## 2017-08-27 DIAGNOSIS — Z79890 Hormone replacement therapy (postmenopausal): Secondary | ICD-10-CM | POA: Diagnosis not present

## 2017-08-27 DIAGNOSIS — F0390 Unspecified dementia without behavioral disturbance: Secondary | ICD-10-CM | POA: Insufficient documentation

## 2017-08-27 DIAGNOSIS — K640 First degree hemorrhoids: Secondary | ICD-10-CM | POA: Diagnosis not present

## 2017-08-27 HISTORY — DX: Dyspnea, unspecified: R06.00

## 2017-08-27 HISTORY — DX: Headache, unspecified: R51.9

## 2017-08-27 HISTORY — DX: Atrial septal defect, unspecified: Q21.10

## 2017-08-27 HISTORY — PX: COLONOSCOPY WITH PROPOFOL: SHX5780

## 2017-08-27 HISTORY — DX: Atrial septal defect: Q21.1

## 2017-08-27 HISTORY — DX: Headache: R51

## 2017-08-27 HISTORY — DX: Concussion with loss of consciousness of unspecified duration, initial encounter: S06.0X9A

## 2017-08-27 SURGERY — COLONOSCOPY WITH PROPOFOL
Anesthesia: General

## 2017-08-27 MED ORDER — LIDOCAINE HCL (PF) 2 % IJ SOLN
INTRAMUSCULAR | Status: DC | PRN
  Administered 2017-08-27: 60 mg

## 2017-08-27 MED ORDER — EPHEDRINE SULFATE 50 MG/ML IJ SOLN
INTRAMUSCULAR | Status: DC | PRN
  Administered 2017-08-27 (×2): 10 mg via INTRAVENOUS

## 2017-08-27 MED ORDER — PIPERACILLIN-TAZOBACTAM 3.375 G IVPB 30 MIN
3.3750 g | Freq: Once | INTRAVENOUS | Status: AC
Start: 2017-08-27 — End: 2017-08-27
  Administered 2017-08-27: 3.375 g via INTRAVENOUS
  Filled 2017-08-27: qty 50

## 2017-08-27 MED ORDER — MIDAZOLAM HCL 2 MG/2ML IJ SOLN
INTRAMUSCULAR | Status: AC
  Filled 2017-08-27: qty 2

## 2017-08-27 MED ORDER — ONDANSETRON HCL 4 MG/2ML IJ SOLN
INTRAMUSCULAR | Status: AC
Start: 2017-08-27 — End: ?
  Filled 2017-08-27: qty 2

## 2017-08-27 MED ORDER — LIDOCAINE HCL (PF) 1 % IJ SOLN
INTRAMUSCULAR | Status: AC
  Administered 2017-08-27: 0.3 mL via INTRADERMAL
  Filled 2017-08-27: qty 2

## 2017-08-27 MED ORDER — MIDAZOLAM HCL 5 MG/5ML IJ SOLN
INTRAMUSCULAR | Status: DC | PRN
  Administered 2017-08-27 (×2): 1 mg via INTRAVENOUS

## 2017-08-27 MED ORDER — SODIUM CHLORIDE 0.9 % IV SOLN
INTRAVENOUS | Status: DC
  Administered 2017-08-27: 1000 mL via INTRAVENOUS

## 2017-08-27 MED ORDER — PROPOFOL 500 MG/50ML IV EMUL
INTRAVENOUS | Status: AC
  Filled 2017-08-27: qty 50

## 2017-08-27 MED ORDER — PROPOFOL 500 MG/50ML IV EMUL
INTRAVENOUS | Status: DC | PRN
  Administered 2017-08-27: 50 ug/kg/min via INTRAVENOUS

## 2017-08-27 MED ORDER — LIDOCAINE HCL (PF) 2 % IJ SOLN
INTRAMUSCULAR | Status: AC
  Filled 2017-08-27: qty 10

## 2017-08-27 MED ORDER — ONDANSETRON HCL 4 MG/2ML IJ SOLN
INTRAMUSCULAR | Status: DC | PRN
  Administered 2017-08-27: 4 mg via INTRAVENOUS

## 2017-08-27 MED ORDER — LIDOCAINE HCL (PF) 1 % IJ SOLN
2.0000 mL | Freq: Once | INTRAMUSCULAR | Status: AC
  Administered 2017-08-27: 0.3 mL via INTRADERMAL

## 2017-08-27 MED ORDER — PROPOFOL 10 MG/ML IV BOLUS
INTRAVENOUS | Status: DC | PRN
  Administered 2017-08-27: 20 mg via INTRAVENOUS
  Administered 2017-08-27: 10 mg via INTRAVENOUS
  Administered 2017-08-27 (×2): 20 mg via INTRAVENOUS

## 2017-08-27 MED ORDER — PIPERACILLIN-TAZOBACTAM 3.375 G IVPB
INTRAVENOUS | Status: AC
  Administered 2017-08-27: 3.375 g via INTRAVENOUS
  Filled 2017-08-27: qty 50

## 2017-08-27 NOTE — Transfer of Care (Signed)
Immediate Anesthesia Transfer of Care Note  Patient: Erin Richards  Procedure(s) Performed: COLONOSCOPY WITH PROPOFOL (N/A )  Patient Location: PACU  Anesthesia Type:General  Level of Consciousness: sedated  Airway & Oxygen Therapy: Patient Spontanous Breathing and Patient connected to nasal cannula oxygen  Post-op Assessment: Report given to RN and Post -op Vital signs reviewed and stable  Post vital signs: Reviewed and stable  Last Vitals:  Vitals Value Taken Time  BP    Temp    Pulse    Resp    SpO2      Last Pain:  Vitals:   08/27/17 1005  TempSrc: Tympanic  PainSc: 0-No pain         Complications: No apparent anesthesia complications
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
## 2017-08-27 NOTE — Anesthesia Post-op Follow-up Note (Signed)
Anesthesia QCDR form completed.        

## 2017-08-27 NOTE — Op Note (Signed)
[facility] Patient Name: Erin Richards Procedure Date: 08/27/2017 10:41 AM MRN: [id number] Account #: [id number] Date of Birth: January 28, 1966 Admit Type: Outpatient Age: 52 Room: [facility] ENDO ROOM 3 Gender: Female Note Status: Finalized Procedure:            Colonoscopy Indications:          High risk colon cancer surveillance: Inflammatory bowel                        disease Providers:            Manya Silvas, MD Referring MD:         Kirstie Peri. Caryn Section, MD (Referring MD) Medicines:            Propofol per Anesthesia Complications:        No immediate complications. Procedure:            Pre-Anesthesia Assessment:                       - After reviewing the risks and benefits, the patient                        was deemed in satisfactory condition to undergo the                        procedure.                       After obtaining informed consent, the colonoscope was                        passed under direct vision. Throughout the procedure,                        the patient's blood pressure, pulse, and oxygen                        saturations were monitored continuously. The                        Colonoscope was introduced through the anus and                        advanced to the the cecum, identified by appendiceal                        orifice and ileocecal valve. The colonoscopy was                        performed without difficulty. The patient tolerated the                        procedure well. The quality of the bowel preparation                        was excellent. Findings:      Green mucus related to the prep seen in right colon and was washed to       remove it.      Internal hemorrhoids were found during endoscopy. The hemorrhoids were       small, medium-sized and Grade I (internal hemorrhoids that  do not       prolapse). The entire colon looked good with no sign of colitis       anywhere. Terminal ileum entered for a  short distance and looked normal.      Biopsies done of ascending colon, transverse colon, descending colon and       sigmoid and rectum placed in same bottle. Done to examine these sections       for microscopic examination. Impression:           - Internal hemorrhoids.                       - No specimens collected. Recommendation:       - Await pathology results. Manya Silvas, MD 08/27/2017 11:40:54 AM This report has been signed electronically. Number of Addenda: 0 Note Initiated On: 08/27/2017 10:41 AM Scope Withdrawal Time: 0 hours 10 minutes 2 seconds  Total Procedure Duration: 0 hours 18 minutes 31 seconds       [facility]

## 2017-08-27 NOTE — Anesthesia Postprocedure Evaluation (Signed)
Anesthesia Post Note  Patient: ANAGHA LOSEKE  Procedure(s) Performed: COLONOSCOPY WITH PROPOFOL (N/A )  Patient location during evaluation: Endoscopy Anesthesia Type: General Level of consciousness: awake and alert Pain management: pain level controlled Vital Signs Assessment: post-procedure vital signs reviewed and stable Respiratory status: spontaneous breathing, nonlabored ventilation, respiratory function stable and patient connected to nasal cannula oxygen Cardiovascular status: blood pressure returned to baseline and stable Postop Assessment: no apparent nausea or vomiting Anesthetic complications: no     Last Vitals:  Vitals:   08/27/17 1152 08/27/17 1158  BP:  (!) 112/53  Pulse: 77 69  Resp: 13 16  Temp:    SpO2: 96% 92%    Last Pain:  Vitals:   08/27/17 1158  TempSrc:   PainSc: 0-No pain                 Leauna Sharber S

## 2017-08-30 ENCOUNTER — Encounter: Payer: Self-pay | Admitting: Unknown Physician Specialty

## 2017-08-30 LAB — SURGICAL PATHOLOGY

## 2017-08-30 NOTE — Progress Notes (Signed)
Patient: Erin Richards Female    DOB: 1965/09/10   52 y.o.   MRN: [id number] Visit Date: 08/31/2017  Today's Provider: Lelon Huh, MD   Chief Complaint  Patient presents with  . Consult   Subjective:    HPI Patient presents today to discuss use of Estradiol 0.5 mg. Patient states she has been taking the medication every since she had hysterectomy in 2010 . She reports she was advised by Dr. Venia Minks not to continue taking the medication after a certain length of time. She is not having any hot flashes or mood swings.     Allergies  Allergen Reactions  . Acetaminophen-Codeine Nausea And Vomiting  . Advair Diskus [Fluticasone-Salmeterol] Other (See Comments)    Blister in mouth  . Antiseptic Oral Rinse  [Cetylpyridinium Chloride] Other (See Comments)    UNK  . Aspartame Other (See Comments)    Reaction: unknown  . Biaxin [Clarithromycin] Nausea And Vomiting  . Carafate [Sucralfate]     Pt says her "Stomach hurts" when she takes it.    . Chlorhexidine Gluconate Nausea And Vomiting  . Clindamycin/Lincomycin Nausea And Vomiting  . Codeine Itching and Nausea And Vomiting  . Dextromethorphan Hbr Other (See Comments)    Reaction: unknown  . Dilaudid [Hydromorphone Hcl] Nausea And Vomiting  . Doxycycline Nausea And Vomiting  . Fentanyl Nausea And Vomiting  . Germanium Other (See Comments)  . Hydrocodone Nausea And Vomiting  . Ketorolac Other (See Comments)  . Levofloxacin Other (See Comments)    GI upset. GI upset  . Mefenamic Acid Nausea And Vomiting  . Metformin And Related Nausea And Vomiting  . Metronidazole Diarrhea and Nausea And Vomiting  . Morphine And Related Nausea And Vomiting  . Moxifloxacin Swelling  . Nitrofurantoin Nausea And Vomiting and Other (See Comments)  . Nsaids Other (See Comments)    Reaction: unknown  . Oxycodone-Acetaminophen Nausea And Vomiting  . Periguard [Dimethicone] Nausea And Vomiting  . Phenothiazines Nausea And Vomiting  .  Pioglitazone Nausea And Vomiting  . Quinidine Nausea And Vomiting  . Quinolones Nausea And Vomiting  . Rumex Crispus Other (See Comments)  . Tetracyclines & Related Nausea And Vomiting  . Toradol [Ketorolac Tromethamine] Nausea And Vomiting  . Tramadol Nausea And Vomiting  . Tussin [Guaifenesin] Nausea And Vomiting  . Tussionex Pennkinetic Er [Hydrocod Polst-Cpm Polst Er] Nausea And Vomiting  . Buprenorphine Hcl Nausea And Vomiting  . Lincomycin Hcl Nausea And Vomiting  . Oxycodone-Acetaminophen Hives and Nausea And Vomiting    Other reaction(s): Nausea And Vomiting, Unknown  . Phenylalanine Nausea And Vomiting     Current Outpatient Medications:  .  albuterol (PROAIR HFA) 108 (90 Base) MCG/ACT inhaler, Inhale 1-2 puffs into the lungs every 6 (six) hours as needed for shortness of breath., Disp: 8.5 Inhaler, Rfl: 5 .  azelastine (ASTELIN) 0.1 % nasal spray, Place 1 spray into both nostrils 2 (two) times daily., Disp: 30 mL, Rfl: 12 .  cetirizine (ZYRTEC) 10 MG tablet, Take 1 tablet (10 mg total) by mouth daily., Disp: 30 tablet, Rfl: 11 .  citalopram (CELEXA) 20 MG tablet, TAKE 0.5 TABLETS (10 MG TOTAL) BY MOUTH DAILY., Disp: 15 tablet, Rfl: 2 .  COMBIVENT RESPIMAT 20-100 MCG/ACT AERS respimat, INHALE 1 PUFF INTO THE LUNGS EVERY 4 (FOUR) HOURS AS NEEDED FOR WHEEZING., Disp: 1 Inhaler, Rfl: 5 .  estradiol (ESTRACE) 0.5 MG tablet, Take 1 tablet (0.5 mg total) by mouth daily., Disp: 30 tablet, Rfl: 5 .  fexofenadine (ALLEGRA) 60 MG tablet, Take 1 tablet (60 mg total) by mouth 2 (two) times daily., Disp: 60 tablet, Rfl: 4 .  fluticasone (FLONASE) 50 MCG/ACT nasal spray, Place 2 sprays into both nostrils daily as needed for allergies or rhinitis. Reported on 04/03/2015, Disp: 16 g, Rfl: 5 .  gabapentin (NEURONTIN) 300 MG capsule, TAKE 1 CAPSULE (300 MG TOTAL) BY MOUTH DAILY., Disp: 30 capsule, Rfl: 11 .  hyoscyamine (LEVSIN, ANASPAZ) 0.125 MG tablet, TAKE ONE TABLET BY MOUTH EVERY 6 HOURS AS  NEEDED FOR CRAMPING FOR UP TO 10 DAYS, Disp: 30 tablet, Rfl: 5 .  ipratropium (ATROVENT) 0.03 % nasal spray, Place 2 sprays into the nose daily as needed., Disp: 30 mL, Rfl: 3 .  loperamide (IMODIUM A-D) 2 MG tablet, Take 1 tablet (2 mg total) by mouth 4 (four) times daily as needed for diarrhea or loose stools., Disp: 30 tablet, Rfl: 0 .  montelukast (SINGULAIR) 10 MG tablet, TAKE 1 TABLET (10 MG TOTAL) BY MOUTH AT BEDTIME., Disp: 30 tablet, Rfl: 12 .  nortriptyline (PAMELOR) 50 MG capsule, Take 1 capsule (50 mg total) by mouth daily as needed (headache)., Disp: 30 capsule, Rfl: 5 .  pantoprazole (PROTONIX) 40 MG tablet, TAKE 1 TABLET BY MOUTH TWICE A DAY, Disp: 60 tablet, Rfl: 5 .  ranitidine (ZANTAC) 150 MG tablet, TAKE 1 TABLET BY MOUTH TWICE A DAY, Disp: 30 tablet, Rfl: 5 .  tiotropium (SPIRIVA) 18 MCG inhalation capsule, Place 1 capsule (18 mcg total) into inhaler and inhale daily., Disp: 30 capsule, Rfl: 12  Review of Systems  Constitutional: Negative for appetite change, chills, fatigue and fever.  Respiratory: Negative for chest tightness and shortness of breath.   Cardiovascular: Negative for chest pain and palpitations.  Gastrointestinal: Negative for abdominal pain, nausea and vomiting.  Neurological: Negative for dizziness and weakness.    Social History   Tobacco Use  . Smoking status: Never Smoker  . Smokeless tobacco: Never Used  Substance Use Topics  . Alcohol use: No   Objective:   BP (!) 97/59 (BP Location: Right Arm, Patient Position: Sitting, Cuff Size: Normal)   Pulse 67   Temp 98.1 F (36.7 C) (Oral)   Wt 132 lb 3.2 oz (60 kg)   SpO2 99%   BMI 28.61 kg/m  Vitals:   08/31/17 1611  BP: (!) 97/59  Pulse: 67  Temp: 98.1 F (36.7 C)  TempSrc: Oral  SpO2: 99%  Weight: 132 lb 3.2 oz (60 kg)     Physical Exam General appearance: alert, well developed, well nourished, cooperative and in no distress Head: Normocephalic, without obvious abnormality,  atraumatic      Assessment & Plan:     1. 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 She could just stop, but given option of taking estradiol every other day for 3 months first.   2. Skin lesions Needs insurance referral.  - Ambulatory referral to Dermatology       Lelon Huh, MD  [facility]

## 2017-08-31 ENCOUNTER — Ambulatory Visit (INDEPENDENT_AMBULATORY_CARE_PROVIDER_SITE_OTHER): Payer: Medicaid Other | Admitting: Family Medicine

## 2017-08-31 ENCOUNTER — Encounter: Payer: Self-pay | Admitting: Family Medicine

## 2017-08-31 VITALS — BP 97/59 | HR 67 | Temp 98.1°F | Wt 132.2 lb

## 2017-08-31 DIAGNOSIS — E8940 Asymptomatic postprocedural ovarian failure: Secondary | ICD-10-CM

## 2017-08-31 DIAGNOSIS — L989 Disorder of the skin and subcutaneous tissue, unspecified: Secondary | ICD-10-CM

## 2017-08-31 DIAGNOSIS — Z79890 Hormone replacement therapy (postmenopausal): Secondary | ICD-10-CM | POA: Diagnosis not present

## 2017-09-29 NOTE — H&P (Signed)
Primary Care Physician:  Birdie Sons, MD Primary Gastroenterologist:  Dr. Vira Agar  Pre-Procedure History & Physical: HPI:  Erin Richards is a 52 y.o. female is here for an colonoscopy. Done for inflammatory bowel disease  Past Medical History:  Diagnosis Date  . Allergy   . ASD (atrial septal defect)   . Clostridium difficile colitis 10/07/2014  . Concussion 07/03/2013  . Depression   . Dyspnea   . Headache    migraines  . History of Clostridium difficile colitis 09/24/2015  . IBS (irritable bowel syndrome)   . 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 CARDIAC SURGERY    . COLONOSCOPY WITH PROPOFOL N/A 08/16/2014   Procedure: COLONOSCOPY WITH PROPOFOL;  Surgeon: Manya Silvas, MD;  Location: [facility] ENDOSCOPY;  Service: Endoscopy;  Laterality: N/A;  . COLONOSCOPY WITH PROPOFOL N/A 08/27/2017   Procedure: COLONOSCOPY WITH PROPOFOL;  Surgeon: Manya Silvas, MD;  Location: [facility] ENDOSCOPY;  Service: Endoscopy;  Laterality: N/A;  . ESOPHAGOGASTRODUODENOSCOPY (EGD) WITH PROPOFOL  08/16/2014   Procedure: ESOPHAGOGASTRODUODENOSCOPY (EGD) WITH PROPOFOL;  Surgeon: Manya Silvas, MD;  Location: [facility] ENDOSCOPY;  Service: Endoscopy;;  . TOTAL ABDOMINAL HYSTERECTOMY W/ BILATERAL SALPINGOOPHORECTOMY  01/08/2009   supracervical    Prior to Admission medications   Medication Sig Start Date End Date Taking? Authorizing Provider  albuterol (PROAIR HFA) 108 (90 Base) MCG/ACT inhaler Inhale 1-2 puffs into the lungs every 6 (six) hours as needed for shortness of breath. 11/25/15   Birdie Sons, MD  azelastine (ASTELIN) 0.1 % nasal spray Place 1 spray into both nostrils 2 (two) times daily. 06/23/17   Birdie Sons, MD  cetirizine (ZYRTEC) 10 MG tablet Take 1 tablet (10 mg total) by mouth daily. 06/02/16   Birdie Sons, MD  citalopram (CELEXA) 20 MG tablet TAKE 0.5 TABLETS (10 MG TOTAL) BY MOUTH DAILY. 08/11/17    Birdie Sons, MD  COMBIVENT RESPIMAT 20-100 MCG/ACT AERS respimat INHALE 1 PUFF INTO THE LUNGS EVERY 4 (FOUR) HOURS AS NEEDED FOR WHEEZING. 02/03/17   Birdie Sons, MD  estradiol (ESTRACE) 0.5 MG tablet Take 1 tablet (0.5 mg total) by mouth daily. 04/23/17   Birdie Sons, MD  fexofenadine (ALLEGRA) 60 MG tablet Take 1 tablet (60 mg total) by mouth 2 (two) times daily. 01/06/17   Birdie Sons, MD  fluticasone (FLONASE) 50 MCG/ACT nasal spray Place 2 sprays into both nostrils daily as needed for allergies or rhinitis. Reported on 04/03/2015 06/23/17   Birdie Sons, MD  gabapentin (NEURONTIN) 300 MG capsule TAKE 1 CAPSULE (300 MG TOTAL) BY MOUTH DAILY. 02/26/17   Birdie Sons, MD  hyoscyamine (LEVSIN, ANASPAZ) 0.125 MG tablet TAKE ONE TABLET BY MOUTH EVERY 6 HOURS AS NEEDED FOR CRAMPING FOR UP TO 10 DAYS 04/23/17   Birdie Sons, MD  ipratropium (ATROVENT) 0.03 % nasal spray Place 2 sprays into the nose daily as needed. 06/23/17   Birdie Sons, MD  loperamide (IMODIUM A-D) 2 MG tablet Take 1 tablet (2 mg total) by mouth 4 (four) times daily as needed for diarrhea or loose stools. 03/31/15   Hower, Aaron Mose, MD  montelukast (SINGULAIR) 10 MG tablet TAKE 1 TABLET (10 MG TOTAL) BY MOUTH AT BEDTIME. 10/05/16   Birdie Sons, MD  nortriptyline (PAMELOR) 50 MG capsule Take 1 capsule (50 mg total) by mouth daily as needed (headache). 07/23/15   Venia Minks,  Izora Gala, MD  pantoprazole (PROTONIX) 40 MG tablet TAKE 1 TABLET BY MOUTH TWICE A DAY 08/08/17   Birdie Sons, MD  ranitidine (ZANTAC) 150 MG tablet TAKE 1 TABLET BY MOUTH TWICE A DAY 08/22/17   Birdie Sons, MD  tiotropium (SPIRIVA) 18 MCG inhalation capsule Place 1 capsule (18 mcg total) into inhaler and inhale daily. 07/27/17 08/26/17  Laverle Hobby, MD    Allergies as of 06/14/2017 - Review Complete 05/25/2017  Allergen Reaction Noted  . Acetaminophen-codeine Nausea And Vomiting 06/03/2014  . Antiseptic oral rinse   [cetylpyridinium chloride] Other (See Comments) 08/21/2014  . Aspartame Other (See Comments) 08/21/2014  . Biaxin [clarithromycin] Nausea And Vomiting 06/03/2014  . Carafate [sucralfate]  04/03/2015  . Chlorhexidine gluconate Nausea And Vomiting 06/03/2014  . Clindamycin/lincomycin Nausea And Vomiting 06/03/2014  . Codeine Itching and Nausea And Vomiting 08/21/2014  . Dextromethorphan hbr Other (See Comments) 08/21/2014  . Dilaudid [hydromorphone hcl] Nausea And Vomiting 06/03/2014  . Doxycycline Nausea And Vomiting 06/03/2014  . Fentanyl Nausea And Vomiting 06/03/2014  . Germanium Other (See Comments) 08/21/2014  . Hydrocodone Nausea And Vomiting 06/03/2014  . Ketorolac Other (See Comments) 08/21/2014  . Levofloxacin Other (See Comments) 08/21/2014  . Mefenamic acid Nausea And Vomiting 06/03/2014  . Metformin and related Nausea And Vomiting 06/03/2014  . Metronidazole Diarrhea and Nausea And Vomiting 08/21/2014  . Morphine and related Nausea And Vomiting 06/03/2014  . Moxifloxacin Swelling 08/21/2014  . Nitrofurantoin Nausea And Vomiting and Other (See Comments) 06/03/2014  . Nsaids Other (See Comments) 08/21/2014  . Oxycodone-acetaminophen Nausea And Vomiting 06/03/2014  . Periguard [dimethicone] Nausea And Vomiting 06/03/2014  . Phenothiazines Nausea And Vomiting 06/03/2014  . Pioglitazone Nausea And Vomiting 06/03/2014  . Quinidine Nausea And Vomiting 06/03/2014  . Quinolones Nausea And Vomiting 06/03/2014  . Rumex crispus Other (See Comments) 08/21/2014  . Tetracyclines & related Nausea And Vomiting 06/03/2014  . Toradol [ketorolac tromethamine] Nausea And Vomiting 06/03/2014  . Tramadol Nausea And Vomiting 06/03/2014  . Tussin [guaifenesin] Nausea And Vomiting 06/03/2014  . Tussionex pennkinetic er [hydrocod polst-cpm polst er] Nausea And Vomiting 06/03/2014  . Buprenorphine hcl Nausea And Vomiting 08/21/2014  . Lincomycin hcl Nausea And Vomiting 08/21/2014  .  Oxycodone-acetaminophen Hives and Nausea And Vomiting 07/17/2015  . Phenylalanine Nausea And Vomiting 08/21/2014    Family History  Problem Relation Age of Onset  . Heart disease Father   . Cancer Father   . Cancer Sister        BREAST    Social History   Socioeconomic History  . Marital status: Divorced    Spouse name: Not on file  . Number of children: 2  . Years of education: Not on file  . Highest education level: Not on file  Occupational History  . Occupation: home maker  Social Needs  . Financial resource strain: Not on file  . Food insecurity:    Worry: Not on file    Inability: Not on file  . Transportation needs:    Medical: Not on file    Non-medical: Not on file  Tobacco Use  . Smoking status: Never Smoker  . Smokeless tobacco: Never Used  Substance and Sexual Activity  . Alcohol use: No  . Drug use: No  . Sexual activity: Not Currently    Birth control/protection: Surgical  Lifestyle  . Physical activity:    Days per week: Not on file    Minutes per session: Not on file  . Stress: Not on file  Relationships  . Social connections:    Talks on phone: Not on file    Gets together: Not on file    Attends religious service: Not on file    Active member of club or organization: Not on file    Attends meetings of clubs or organizations: Not on file    Relationship status: Not on file  . Intimate partner violence:    Fear of current or ex partner: Not on file    Emotionally abused: Not on file    Physically abused: Not on file    Forced sexual activity: Not on file  Other Topics Concern  . Not on file  Social History Narrative  . Not on file    Review of Systems: See HPI, otherwise negative ROS  Physical Exam: BP (!) 112/53   Pulse 69   Temp 98.1 F (36.7 C) (Tympanic)   Resp 16   Ht 4\' 9"  (1.448 m)   Wt 59.4 kg   SpO2 92%   BMI 28.35 kg/m  General:   Alert,  pleasant and cooperative in NAD Head:  Normocephalic and atraumatic. Neck:   Supple; no masses or thyromegaly. Lungs:  Clear throughout to auscultation.    Heart:  Regular rate and rhythm. Abdomen:  Soft, nontender and nondistended. Normal bowel sounds, without guarding, and without rebound.   Neurologic:  Alert and  oriented x4;  grossly normal neurologically.  Impression/Plan: Erin Richards is here for an colonoscopy to be performed for Inflammatory bowel disease.  Risks, benefits, limitations, and alternatives regarding  colonoscopy have been reviewed with the patient.  Questions have been answered.  All parties agreeable.   Gaylyn Cheers, MD  09/29/2017, 4:54 PM

## 2017-10-23 ENCOUNTER — Other Ambulatory Visit: Payer: Self-pay | Admitting: Family Medicine

## 2017-10-27 ENCOUNTER — Encounter: Payer: Self-pay | Admitting: Family Medicine
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
## 2017-11-12 ENCOUNTER — Encounter: Payer: Medicaid Other | Admitting: Family Medicine
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
## 2017-11-23 ENCOUNTER — Other Ambulatory Visit: Payer: Self-pay | Admitting: Family Medicine

## 2017-11-23 DIAGNOSIS — F321 Major depressive disorder, single episode, moderate: Secondary | ICD-10-CM

## 2017-11-23 NOTE — Telephone Encounter (Signed)
Please see if patient wants to change to 10mg  tablet, that way she won't have to break the 20mg  tablet in half.

## 2017-11-24 NOTE — Telephone Encounter (Signed)
Patient states that changing the citalopram from 20mg  to 10mg  will be fine.

## 2017-11-30 ENCOUNTER — Encounter: Payer: Self-pay | Admitting: Family Medicine

## 2017-11-30 ENCOUNTER — Ambulatory Visit (INDEPENDENT_AMBULATORY_CARE_PROVIDER_SITE_OTHER): Payer: Medicaid Other | Admitting: Family Medicine

## 2017-11-30 VITALS — BP 104/70 | HR 68 | Temp 98.5°F | Resp 16 | Ht <= 58 in | Wt 132.8 lb

## 2017-11-30 DIAGNOSIS — M25511 Pain in right shoulder: Secondary | ICD-10-CM

## 2017-11-30 DIAGNOSIS — F321 Major depressive disorder, single episode, moderate: Secondary | ICD-10-CM

## 2017-11-30 DIAGNOSIS — Z0000 Encounter for general adult medical examination without abnormal findings: Secondary | ICD-10-CM

## 2017-11-30 DIAGNOSIS — E876 Hypokalemia: Secondary | ICD-10-CM

## 2017-11-30 DIAGNOSIS — M25512 Pain in left shoulder: Secondary | ICD-10-CM

## 2017-11-30 DIAGNOSIS — R300 Dysuria: Secondary | ICD-10-CM | POA: Diagnosis not present

## 2017-11-30 LAB — POCT URINALYSIS DIPSTICK
Bilirubin, UA: NEGATIVE
Glucose, UA: NEGATIVE
KETONES UA: NEGATIVE
Leukocytes, UA: NEGATIVE
Nitrite, UA: NEGATIVE
PH UA: 6 (ref 5.0–8.0)
Protein, UA: NEGATIVE
SPEC GRAV UA: 1.01 (ref 1.010–1.025)
UROBILINOGEN UA: 0.2 U/dL
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
## 2017-11-30 NOTE — Patient Instructions (Addendum)
.   Please call the [facility] [phone]) to schedule a routine screening mammogram.   The CDC recommends two doses of Shingrix (the shingles vaccine) separated by 2 to 6 months for adults age 49 years and older. I recommend checking with your insurance plan regarding coverage for this vaccine.

## 2017-12-01 LAB — COMPREHENSIVE METABOLIC PANEL
A/G RATIO: 1.8 (ref 1.2–2.2)
ALT: 8 IU/L (ref 0–32)
AST: 16 IU/L (ref 0–40)
Albumin: 4.4 g/dL (ref 3.5–5.5)
Alkaline Phosphatase: 69 IU/L (ref 39–117)
BILIRUBIN TOTAL: 0.8 mg/dL (ref 0.0–1.2)
BUN/Creatinine Ratio: 12 (ref 9–23)
BUN: 9 mg/dL (ref 6–24)
CHLORIDE: 101 mmol/L (ref 96–106)
CO2: 25 mmol/L (ref 20–29)
Calcium: 9.3 mg/dL (ref 8.7–10.2)
Creatinine, Ser: 0.73 mg/dL (ref 0.57–1.00)
GFR calc non Af Amer: 96 mL/min/{1.73_m2} (ref >59)
GFR, EST AFRICAN AMERICAN: 111 mL/min/{1.73_m2} (ref >59)
Globulin, Total: 2.4 g/dL (ref 1.5–4.5)
Glucose: 83 mg/dL (ref 65–99)
POTASSIUM: 4.2 mmol/L (ref 3.5–5.2)
Sodium: 141 mmol/L (ref 134–144)
TOTAL PROTEIN: 6.8 g/dL (ref 6.0–8.5)

## 2017-12-01 LAB — LIPID PANEL
Chol/HDL Ratio: 2.9 ratio (ref 0.0–4.4)
Cholesterol, Total: 136 mg/dL (ref 100–199)
HDL: 47 mg/dL (ref >39)
LDL Calculated: 75 mg/dL (ref 0–99)
Triglycerides: 71 mg/dL (ref 0–149)
VLDL Cholesterol Cal: 14 mg/dL (ref 5–40)

## 2017-12-10 ENCOUNTER — Telehealth: Payer: Self-pay | Admitting: Family Medicine

## 2017-12-10 DIAGNOSIS — M25511 Pain in right shoulder: Secondary | ICD-10-CM

## 2017-12-10 DIAGNOSIS — M25512 Pain in left shoulder: Principal | ICD-10-CM

## 2017-12-10 NOTE — Telephone Encounter (Signed)
Pt was referred to  [facility]  Dr. Noemi Chapel sent pt to [facility].  However, they can only see pt twice due to her insurance. Her 1st appt is  Nov. 20th and only one more after that.  Dr. Noemi Chapel said to ask Dr. Caryn Section he could referr pt to [facility] because it is free.  Please advise.  Thanks, [employer]

## 2017-12-14 ENCOUNTER — Telehealth: Payer: Self-pay | Admitting: Family Medicine

## 2017-12-14 NOTE — Telephone Encounter (Signed)
There was a referral placed for physical therapy.Pt would like to be seen at [facility] in [place].Can you please fax an order to me for PT,Eval & treat to [phone]

## 2017-12-14 NOTE — Telephone Encounter (Signed)
Done. Please fax order to sarah

## 2017-12-15 NOTE — Telephone Encounter (Signed)
Order faxed.   Thanks,   -Laura  

## 2017-12-21 ENCOUNTER — Telehealth: Payer: Self-pay | Admitting: Family Medicine

## 2017-12-21 NOTE — Telephone Encounter (Signed)
° °  fexofenadine (ALLEGRA) 60 MG tablet - needing a prior [facility] has not received a referral.

## 2017-12-21 NOTE — Telephone Encounter (Signed)
Please advise pt on the above.  Thanks, [employer]

## 2017-12-22 ENCOUNTER — Ambulatory Visit: Payer: Medicaid Other | Attending: Orthopedic Surgery | Admitting: Physical Therapy

## 2017-12-22 ENCOUNTER — Encounter: Payer: Self-pay | Admitting: Physical Therapy

## 2017-12-22 DIAGNOSIS — M25512 Pain in left shoulder: Secondary | ICD-10-CM | POA: Diagnosis present

## 2017-12-22 DIAGNOSIS — M25511 Pain in right shoulder: Secondary | ICD-10-CM | POA: Diagnosis present

## 2017-12-22 DIAGNOSIS — G8929 Other chronic pain: Secondary | ICD-10-CM | POA: Insufficient documentation

## 2017-12-22 DIAGNOSIS — M25522 Pain in left elbow: Secondary | ICD-10-CM
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
## 2018-01-03 ENCOUNTER — Ambulatory Visit: Payer: Medicaid Other | Admitting: Physical Therapy

## 2018-01-04 ENCOUNTER — Ambulatory Visit: Payer: Medicaid Other | Attending: Orthopedic Surgery | Admitting: Physical Therapy

## 2018-01-04 ENCOUNTER — Encounter: Payer: Self-pay | Admitting: Physical Therapy

## 2018-01-04 DIAGNOSIS — G8929 Other chronic pain: Secondary | ICD-10-CM | POA: Diagnosis present

## 2018-01-04 DIAGNOSIS — M25512 Pain in left shoulder: Secondary | ICD-10-CM | POA: Insufficient documentation
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
## 2018-01-05 ENCOUNTER — Other Ambulatory Visit: Payer: Self-pay | Admitting: Family Medicine

## 2018-01-05 DIAGNOSIS — R109 Unspecified abdominal pain: Secondary | ICD-10-CM

## 2018-01-05 MED ORDER — RANITIDINE HCL 150 MG PO TABS: 150.0000 mg | ORAL_TABLET | Freq: Two times a day (BID) | ORAL | 5 refills | Status: DC

## 2018-01-05 NOTE — Telephone Encounter (Signed)
I thought zantac was pulled off the shelves. Is this fax for zantac or for a replacement for zantac?

## 2018-01-05 NOTE — Telephone Encounter (Signed)
[facility] faxed refill request for the following medications:  ranitidine (ZANTAC) 150 MG tablet  Original date written: 08/22/2017  Last date filled:  12/13/2017  Please advise.

## 2018-01-05 NOTE — Telephone Encounter (Signed)
Per pharmacy, only the brand name Zantac was pulled off the shelves. Pharmacist states that generic Zantac (Ranitidine) is fine. Pharmacy requesting refill for Generic.

## 2018-01-06 ENCOUNTER — Encounter: Payer: Medicaid Other | Admitting: Physical Therapy

## 2018-01-07 ENCOUNTER — Telehealth: Payer: Self-pay | Admitting: Family Medicine

## 2018-01-07 DIAGNOSIS — M25511 Pain in right shoulder: Secondary | ICD-10-CM

## 2018-01-07 DIAGNOSIS — M25512 Pain in left shoulder: Principal | ICD-10-CM

## 2018-01-07 NOTE — Telephone Encounter (Signed)
Please review. Thanks!  

## 2018-01-07 NOTE — Telephone Encounter (Signed)
Patient called stating [facility] has not received a referral for her.   She thinks we have wrong fax #.  The correct fax  number is [phone]. Their telephone # is [phone]

## 2018-01-10 ENCOUNTER — Encounter

## 2018-01-10 ENCOUNTER — Ambulatory Visit: Payer: Medicaid Other | Admitting: Podiatry

## 2018-01-10 ENCOUNTER — Ambulatory Visit (INDEPENDENT_AMBULATORY_CARE_PROVIDER_SITE_OTHER): Payer: Medicaid Other

## 2018-01-10 ENCOUNTER — Encounter: Payer: Self-pay | Admitting: Podiatry

## 2018-01-10 ENCOUNTER — Other Ambulatory Visit: Payer: Self-pay | Admitting: Podiatry

## 2018-01-10 DIAGNOSIS — M722 Plantar fascial fibromatosis: Secondary | ICD-10-CM | POA: Diagnosis not present

## 2018-01-10 DIAGNOSIS — M2580 Other specified joint disorders, unspecified joint: Secondary | ICD-10-CM

## 2018-01-10 MED ORDER — MELOXICAM 15 MG PO TABS: 15.0000 mg | ORAL_TABLET | Freq: Every day | ORAL | 3 refills | Status: DC

## 2018-01-10 MED ORDER — METHYLPREDNISOLONE 4 MG PO TBPK: ORAL_TABLET | ORAL | 0 refills | Status: DC

## 2018-01-10 NOTE — Telephone Encounter (Signed)
FYI

## 2018-01-10 NOTE — Telephone Encounter (Signed)
Can you please look into this. Was originally referred on 12-13-2017

## 2018-01-10 NOTE — Telephone Encounter (Signed)
Per Daleen Snook at [facility] they will not have any appointments until Jan.Pt is aware and will wait until appointment is available

## 2018-01-11 ENCOUNTER — Encounter: Payer: Medicaid Other | Admitting: Physical Therapy

## 2018-01-11 NOTE — Progress Notes (Signed)
Erin Richards presents today for bilateral heel pain times several months she states that is sharp and shooting that it comes and goes and hurts really bad particularly in the mornings that she can walk it off until the afternoon and it becomes much painful again.  She is tried Tylenol meloxicam ice stretching with minimal relief.  Objective: Vital signs are stable alert and oriented x3.  Pulses are palpable.  Neurologic sensorium is intact degenerative flexors are intact muscle strength is normal symmetrical bilateral.  Orthopedic evaluation demonstrates no pain on medial lateral compression of the calcaneus and no pain on palpation posterior aspect of the calcaneus.  She has pain on palpation medial calcaneal tubercles bilateral left greater than right.  Assessment: At this point she is diagnosed with plantar fasciitis.  Plan: Discussed etiology pathology conservative versus surgical therapies.  I injected the bilateral heels with 20 mg Kenalog 5 mg Marcaine point maximal tenderness medial aspect of bilateral heel without complications.  Tolerated procedure well.  Discussed appropriate shoe gear stretching exercise ice therapy sugar modifications to use orthotics start her on a Medrol Dosepak to be followed by meloxicam.  I will follow-up with her in 1 month.

## 2018-01-12 ENCOUNTER — Encounter: Payer: Self-pay | Admitting: Physical Therapy

## 2018-01-12 ENCOUNTER — Ambulatory Visit: Payer: Medicaid Other | Admitting: Physical Therapy

## 2018-01-12 DIAGNOSIS — G8929 Other chronic pain: Secondary | ICD-10-CM

## 2018-01-12 DIAGNOSIS — M25512 Pain in left shoulder: Secondary | ICD-10-CM | POA: Diagnosis not present
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
## 2018-01-17 ENCOUNTER — Telehealth: Payer: Self-pay | Admitting: Family Medicine

## 2018-01-17 NOTE — Telephone Encounter (Signed)
That's fine

## 2018-01-17 NOTE — Telephone Encounter (Signed)
Pt advised, apt made for 01/19/2018 at 3:40pm  Thanks,   -Mickel Baas

## 2018-01-17 NOTE — Telephone Encounter (Signed)
Can she take the 3:40 time slot?  Or see a different provider around the same time.  Thanks,   -Mickel Baas

## 2018-01-17 NOTE — Telephone Encounter (Signed)
Pt is having burning during urination.  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 Husband - Haywood Lasso has an appt w/ Caryn Section on Wed., Dec 18th.   Please advise pt.  Thanks, [employer]

## 2018-01-18 ENCOUNTER — Encounter: Payer: Medicaid Other | Admitting: Physical Therapy

## 2018-01-19 ENCOUNTER — Ambulatory Visit (INDEPENDENT_AMBULATORY_CARE_PROVIDER_SITE_OTHER): Payer: Medicaid Other | Admitting: Family Medicine

## 2018-01-19 ENCOUNTER — Encounter: Payer: Self-pay | Admitting: Family Medicine

## 2018-01-19 ENCOUNTER — Ambulatory Visit
Admission: RE | Admit: 2018-01-19 | Discharge: 2018-01-19 | Disposition: A | Discharge status: Home / Self Care | Payer: Medicaid Other | Attending: Family Medicine | Admitting: Family Medicine

## 2018-01-19 ENCOUNTER — Ambulatory Visit
Admission: RE | Admit: 2018-01-19 | Discharge: 2018-01-19 | Disposition: A | Discharge status: Home / Self Care | Payer: Medicaid Other | Source: Ambulatory Visit | Attending: Family Medicine | Admitting: Family Medicine

## 2018-01-19 VITALS — BP 104/52 | HR 68 | Temp 97.7°F | Resp 16 | Wt 136.0 lb

## 2018-01-19 DIAGNOSIS — R1084 Generalized abdominal pain: Secondary | ICD-10-CM | POA: Insufficient documentation

## 2018-01-19 LAB — POCT URINALYSIS DIPSTICK
Bilirubin, UA: NEGATIVE
Blood, UA: NEGATIVE
Glucose, UA: NEGATIVE
Ketones, UA: NEGATIVE
Leukocytes, UA: NEGATIVE
Nitrite, UA: NEGATIVE
PROTEIN UA: NEGATIVE
Spec Grav, UA: 1.015 (ref 1.010–1.025)
Urobilinogen, UA: 0.2 E.U./dL
pH, UA: 5 (ref 5.0–8.0)

## 2018-01-19 MED ORDER — SULFAMETHOXAZOLE-TRIMETHOPRIM 800-160 MG PO TABS: 1.0000 | ORAL_TABLET | Freq: Two times a day (BID) | ORAL | 0 refills | Status: AC

## 2018-01-19 NOTE — Patient Instructions (Signed)
Start taking you PRESCRIPTION of hyoscyamine

## 2018-01-19 NOTE — Progress Notes (Signed)
Patient: Erin Richards Female    DOB: [birth year]   52 y.o.   MRN: [id number] Visit Date: 01/19/2018  Today's Provider: Lelon Huh, MD   Chief Complaint  Patient presents with  . Abdominal Pain    Started about three days ago.   Subjective:     Urinary Tract Infection   This is a new problem. Episode onset: Started three days ago. The problem has been gradually worsening. There has been no fever. Associated symptoms include chills, frequency and urgency. Pertinent negatives include no discharge, hematuria, hesitancy, nausea, possible pregnancy, sweats or vomiting.  Abdominal Pain  This is a new problem. The current episode started yesterday. The problem occurs constantly. The problem has been rapidly worsening. The pain is located in the generalized abdominal region. The quality of the pain is sharp and cramping. Associated symptoms include dysuria and frequency. Pertinent negatives include no anorexia, constipation, diarrhea, fever, headaches, hematuria, nausea, vomiting or weight loss. Exacerbated by: Movement. The pain is relieved by nothing. She has tried nothing for the symptoms.    Allergies  Allergen Reactions  . Acetaminophen-Codeine Nausea And Vomiting  . Antiseptic Oral Rinse  [Cetylpyridinium Chloride] Other (See Comments)    UNK  . Aspartame Other (See Comments)    Reaction: unknown  . Biaxin [Clarithromycin] Nausea And Vomiting  . Carafate [Sucralfate]     Pt says her "Stomach hurts" when she takes it.    . Chlorhexidine Gluconate Nausea And Vomiting  . Clindamycin/Lincomycin Nausea And Vomiting  . Codeine Itching and Nausea And Vomiting  . Dextromethorphan Hbr Other (See Comments)    Reaction: unknown  . Dilaudid [Hydromorphone Hcl] Nausea And Vomiting  . Doxycycline Nausea And Vomiting  . Fentanyl Nausea And Vomiting  . Fluticasone-Salmeterol Other (See Comments)    Blister in mouth Other reaction(s): Other (See Comments) Blister in mouth  .  Germanium Other (See Comments)  . Hydrocodone Nausea And Vomiting  . Ketorolac Other (See Comments)  . Levofloxacin Other (See Comments)    GI upset. GI upset  . Mefenamic Acid Nausea And Vomiting  . Metformin And Related Nausea And Vomiting  . Metronidazole Diarrhea and Nausea And Vomiting  . Morphine And Related Nausea And Vomiting  . Moxifloxacin Swelling  . Nitrofurantoin Nausea And Vomiting and Other (See Comments)  . Nsaids Other (See Comments)    Reaction: unknown  . Oxycodone-Acetaminophen Nausea And Vomiting  . Periguard [Dimethicone] Nausea And Vomiting  . Phenothiazines Nausea And Vomiting  . Pioglitazone Nausea And Vomiting  . Quinidine Nausea And Vomiting  . Quinolones Nausea And Vomiting  . Rumex Crispus Other (See Comments)  . Tetracyclines & Related Nausea And Vomiting  . Toradol [Ketorolac Tromethamine] Nausea And Vomiting  . Tramadol Nausea And Vomiting  . Tussin [Guaifenesin] Nausea And Vomiting  . Tussionex Pennkinetic Er [Hydrocod Polst-Cpm Polst Er] Nausea And Vomiting  . Buprenorphine Hcl Nausea And Vomiting  . Lincomycin Hcl Nausea And Vomiting  . Oxycodone-Acetaminophen Hives and Nausea And Vomiting    Other reaction(s): Nausea And Vomiting, Unknown  . Phenylalanine Nausea And Vomiting     Current Outpatient Medications:  .  albuterol (PROAIR HFA) 108 (90 Base) MCG/ACT inhaler, Inhale 1-2 puffs into the lungs every 6 (six) hours as needed for shortness of breath., Disp: 8.5 Inhaler, Rfl: 5 .  azelastine (ASTELIN) 0.1 % nasal spray, Place 1 spray into both nostrils 2 (two) times daily., Disp: 30 mL, Rfl: 12 .  cetirizine (ZYRTEC) 10  MG tablet, Take 1 tablet (10 mg total) by mouth daily., Disp: 30 tablet, Rfl: 11 .  citalopram (CELEXA) 10 MG tablet, Take 1 tablet (10 mg total) by mouth daily., Disp: 30 tablet, Rfl: 12 .  COMBIVENT RESPIMAT 20-100 MCG/ACT AERS respimat, INHALE 1 PUFF INTO THE LUNGS EVERY 4 (FOUR) HOURS AS NEEDED FOR WHEEZING., Disp: 1  Inhaler, Rfl: 5 .  estradiol (ESTRACE) 0.5 MG tablet, TAKE 1 TABLET BY MOUTH EVERY DAY, Disp: 30 tablet, Rfl: 5 .  fexofenadine (ALLEGRA) 60 MG tablet, Take 1 tablet (60 mg total) by mouth 2 (two) times daily., Disp: 60 tablet, Rfl: 4 .  fluticasone (FLONASE) 50 MCG/ACT nasal spray, Place 2 sprays into both nostrils daily as needed for allergies or rhinitis. Reported on 04/03/2015, Disp: 16 g, Rfl: 5 .  gabapentin (NEURONTIN) 300 MG capsule, TAKE 1 CAPSULE (300 MG TOTAL) BY MOUTH DAILY., Disp: 30 capsule, Rfl: 11 .  hyoscyamine (LEVSIN, ANASPAZ) 0.125 MG tablet, TAKE ONE TABLET BY MOUTH EVERY 6 HOURS AS NEEDED FOR CRAMPING FOR UP TO 10 DAYS, Disp: 30 tablet, Rfl: 5 .  ipratropium (ATROVENT) 0.03 % nasal spray, Place 2 sprays into the nose daily as needed., Disp: 30 mL, Rfl: 3 .  loperamide (IMODIUM A-D) 2 MG tablet, Take 1 tablet (2 mg total) by mouth 4 (four) times daily as needed for diarrhea or loose stools., Disp: 30 tablet, Rfl: 0 .  meloxicam (MOBIC) 15 MG tablet, Take 1 tablet (15 mg total) by mouth daily., Disp: 30 tablet, Rfl: 3 .  methylPREDNISolone (MEDROL DOSEPAK) 4 MG TBPK tablet, 6 day dose pack - take as directed, Disp: 21 tablet, Rfl: 0 .  montelukast (SINGULAIR) 10 MG tablet, TAKE 1 TABLET (10 MG TOTAL) BY MOUTH AT BEDTIME., Disp: 30 tablet, Rfl: 12 .  nortriptyline (PAMELOR) 50 MG capsule, Take 1 capsule (50 mg total) by mouth daily as needed (headache)., Disp: 30 capsule, Rfl: 5 .  pantoprazole (PROTONIX) 40 MG tablet, TAKE 1 TABLET BY MOUTH TWICE A DAY, Disp: 60 tablet, Rfl: 5 .  ranitidine (ZANTAC) 150 MG tablet, Take 1 tablet (150 mg total) by mouth 2 (two) times daily., Disp: 30 tablet, Rfl: 5 .  tiotropium (SPIRIVA) 18 MCG inhalation capsule, Place 1 capsule (18 mcg total) into inhaler and inhale daily., Disp: 30 capsule, Rfl: 12  Review of Systems  Constitutional: Positive for chills and fatigue. Negative for activity change, appetite change, diaphoresis, fever, unexpected  weight change and weight loss.  Gastrointestinal: Positive for abdominal pain (Left sided abdominal pain.). Negative for abdominal distention, anal bleeding, anorexia, blood in stool, constipation, diarrhea, nausea, rectal pain and vomiting.  Genitourinary: Positive for dysuria, frequency and urgency. Negative for decreased urine volume, difficulty urinating, dyspareunia, hematuria, hesitancy, vaginal bleeding, vaginal discharge and vaginal pain.  Neurological: Positive for light-headedness. Negative for dizziness and headaches.    Social History   Tobacco Use  . Smoking status: Never Smoker  . Smokeless tobacco: Never Used  Substance Use Topics  . Alcohol use: No      Objective:   BP (!) 104/52 (BP Location: Right Arm, Patient Position: Sitting, Cuff Size: Normal)   Pulse 68   Temp 97.7 F (36.5 C) (Oral)   Resp 16   Wt 136 lb (61.7 kg)   BMI 29.95 kg/m    Physical Exam  General Appearance:    Alert, cooperative, no distress  Eyes:    PERRL, conjunctiva/corneas clear, EOM's intact       Lungs:  Clear to auscultation bilaterally, respirations unlabored  Heart:    Regular rate and rhythm  Abdomen:   Diffusely tenderness throughout entire abdomen, flank, and lower back. No point tenderness. No rebound tenderness. No masses. Normal bowel sounds.    Results for orders placed or performed in visit on 01/19/18  POCT urinalysis dipstick  Result Value Ref Range   Color, UA     Clarity, UA     Glucose, UA Negative Negative   Bilirubin, UA Negative    Ketones, UA Negative    Spec Grav, UA 1.015 1.010 - 1.025   Blood, UA Negative    pH, UA 5.0 5.0 - 8.0   Protein, UA Negative Negative   Urobilinogen, UA 0.2 0.2 or 1.0 E.U./dL   Nitrite, UA Negative    Leukocytes, UA Negative Negative   Appearance     Odor         Assessment & Plan    1. Generalized abdominal pain Non-specific exam. She states it feels like spasms and advised to start back on her hyoscyamine  prescription that she has at home. Consider abdominal CT if labs normal and hyoscyamine not effective.  - CBC - Comprehensive metabolic panel - DG Abd 2 Views; Future - Amylase - POCT urinalysis dipstick - CULTURE, URINE COMPREHENSIVE     Lelon Huh, MD  [facility]

## 2018-01-20 ENCOUNTER — Emergency Department: Payer: Medicaid Other

## 2018-01-20 ENCOUNTER — Encounter: Payer: Self-pay | Admitting: Emergency Medicine

## 2018-01-20 ENCOUNTER — Telehealth: Payer: Self-pay

## 2018-01-20 ENCOUNTER — Other Ambulatory Visit: Payer: Self-pay

## 2018-01-20 ENCOUNTER — Ambulatory Visit: Payer: Medicaid Other | Admitting: Physical Therapy

## 2018-01-20 ENCOUNTER — Emergency Department
Admission: EM | Admit: 2018-01-20 | Discharge: 2018-01-20 | Disposition: A | Discharge status: Home / Self Care | Payer: Medicaid Other | Attending: Emergency Medicine | Admitting: Emergency Medicine

## 2018-01-20 DIAGNOSIS — R1084 Generalized abdominal pain: Secondary | ICD-10-CM

## 2018-01-20 DIAGNOSIS — K5900 Constipation, unspecified: Secondary | ICD-10-CM

## 2018-01-20 DIAGNOSIS — Z79899 Other long term (current) drug therapy: Secondary | ICD-10-CM | POA: Diagnosis not present

## 2018-01-20 DIAGNOSIS — R1032 Left lower quadrant pain: Secondary | ICD-10-CM | POA: Diagnosis present

## 2018-01-20 LAB — COMPREHENSIVE METABOLIC PANEL
ALT: 7 IU/L (ref 0–32)
ALT: 9 U/L (ref 0–44)
AST: 9 IU/L (ref 0–40)
AST: 17 U/L (ref 15–41)
Albumin/Globulin Ratio: 2 (ref 1.2–2.2)
Albumin: 4.5 g/dL (ref 3.5–5.5)
Albumin: 4.2 g/dL (ref 3.5–5.0)
Alkaline Phosphatase: 71 IU/L (ref 39–117)
Alkaline Phosphatase: 59 U/L (ref 38–126)
Anion gap: 9 (ref 5–15)
BUN/Creatinine Ratio: 12 (ref 9–23)
BUN: 10 mg/dL (ref 6–24)
BUN: 16 mg/dL (ref 6–20)
Bilirubin Total: 0.9 mg/dL (ref 0.0–1.2)
CHLORIDE: 102 mmol/L (ref 96–106)
CHLORIDE: 104 mmol/L (ref 98–111)
CO2: 26 mmol/L (ref 20–29)
CO2: 24 mmol/L (ref 22–32)
Calcium: 9.1 mg/dL (ref 8.7–10.2)
Calcium: 8.9 mg/dL (ref 8.9–10.3)
Creatinine, Ser: 0.86 mg/dL (ref 0.57–1.00)
Creatinine, Ser: 0.83 mg/dL (ref 0.44–1.00)
GFR calc Af Amer: 91 mL/min/{1.73_m2} (ref >59)
GFR calc Af Amer: >60 mL/min (ref >60)
GFR calc non Af Amer: 79 mL/min/{1.73_m2} (ref >59)
GFR calc non Af Amer: >60 mL/min (ref >60)
Globulin, Total: 2.2 g/dL (ref 1.5–4.5)
Glucose, Bld: 91 mg/dL (ref 70–99)
Glucose: 87 mg/dL (ref 65–99)
Potassium: 4 mmol/L (ref 3.5–5.2)
Potassium: 4.4 mmol/L (ref 3.5–5.1)
Sodium: 143 mmol/L (ref 134–144)
Sodium: 137 mmol/L (ref 135–145)
Total Bilirubin: 1.1 mg/dL (ref 0.3–1.2)
Total Protein: 6.7 g/dL (ref 6.0–8.5)
Total Protein: 7.2 g/dL (ref 6.5–8.1)

## 2018-01-20 LAB — CBC
HEMATOCRIT: 43.9 % (ref 36.0–46.0)
HEMOGLOBIN: 14.4 g/dL (ref 12.0–15.0)
Hematocrit: 41.3 % (ref 34.0–46.6)
Hemoglobin: 13.3 g/dL (ref 11.1–15.9)
MCH: 25.5 pg — ABNORMAL LOW (ref 26.6–33.0)
MCH: 26.6 pg (ref 26.0–34.0)
MCHC: 32.2 g/dL (ref 31.5–35.7)
MCHC: 32.8 g/dL (ref 30.0–36.0)
MCV: 79 fL (ref 79–97)
MCV: 81.1 fL (ref 80.0–100.0)
Platelets: 201 10*3/uL (ref 150–450)
Platelets: 189 10*3/uL (ref 150–400)
RBC: 5.22 x10E6/uL (ref 3.77–5.28)
RBC: 5.41 MIL/uL — ABNORMAL HIGH (ref 3.87–5.11)
RDW: 13.5 % (ref 12.3–15.4)
RDW: 13.7 % (ref 11.5–15.5)
WBC: 7.8 10*3/uL (ref 3.4–10.8)
WBC: 8.7 10*3/uL (ref 4.0–10.5)
nRBC: 0 % (ref 0.0–0.2)

## 2018-01-20 LAB — URINALYSIS, COMPLETE (UACMP) WITH MICROSCOPIC
BACTERIA UA: NONE SEEN
Bilirubin Urine: NEGATIVE
Glucose, UA: NEGATIVE mg/dL
Hgb urine dipstick: NEGATIVE
Ketones, ur: NEGATIVE mg/dL
Leukocytes, UA: NEGATIVE
Nitrite: NEGATIVE
Protein, ur: NEGATIVE mg/dL
Specific Gravity, Urine: 1.008 (ref 1.005–1.030)
Squamous Epithelial / HPF: NONE SEEN (ref 0–5)
pH: 7 (ref 5.0–8.0)

## 2018-01-20 LAB — LIPASE, BLOOD: LIPASE: 37 U/L (ref 11–51)

## 2018-01-20 LAB — AMYLASE: Amylase: 57 U/L (ref 31–124)

## 2018-01-20 MED ORDER — ONDANSETRON HCL 4 MG/2ML IJ SOLN
4.0000 mg | Freq: Once | INTRAMUSCULAR | Status: AC
  Administered 2018-01-20: 4 mg via INTRAVENOUS
  Filled 2018-01-20: qty 2

## 2018-01-20 MED ORDER — POLYETHYLENE GLYCOL 3350 17 G PO PACK: 17.0000 g | PACK | Freq: Every day | ORAL | 0 refills | Status: DC

## 2018-01-20 MED ORDER — IOPAMIDOL (ISOVUE-300) INJECTION 61%: 30.0000 mL | Freq: Once | INTRAVENOUS | Status: DC

## 2018-01-20 MED ORDER — IOPAMIDOL (ISOVUE-300) INJECTION 61%
100.0000 mL | Freq: Once | INTRAVENOUS | Status: AC | PRN
  Administered 2018-01-20: 100 mL via INTRAVENOUS

## 2018-01-20 MED ORDER — HYDROMORPHONE HCL 1 MG/ML IJ SOLN
0.5000 mg | Freq: Once | INTRAMUSCULAR | Status: AC
  Administered 2018-01-20: 0.5 mg via INTRAVENOUS
  Filled 2018-01-20: qty 1

## 2018-01-20 NOTE — ED Notes (Signed)
Patient transported to CT 

## 2018-01-20 NOTE — ED Triage Notes (Signed)
Pt to ED from home c/o left abd pain radiating to left flank x4 days getting worse.  Seen at PCP yesterday with blood work done and xray, told to come to ER for further evaluation.  Pt states pain with urination, frequency, nausea.

## 2018-01-20 NOTE — Discharge Instructions (Addendum)
It looks like the pain you are having is from the constipation.  All the other tests are essentially normal.  I would try some milk of magnesia or MiraLAX or other medication which pharmacy can recommend for the constipation.  I have written you for some MiraLAX please return for increasing pain fever vomiting or any other problems.  Please follow-up with your regular doctor.

## 2018-01-20 NOTE — ED Notes (Signed)
ED Provider at bedside. 

## 2018-01-20 NOTE — Telephone Encounter (Signed)
Pt advised.  She states she is still in a lot of pain and the hyoscyamine is not helping. She would like to proceed with the CT scan if possible.  Also she wanted to know if she should continue with the antibiotic?  Thanks,   -Mickel Baas

## 2018-01-20 NOTE — ED Notes (Signed)
Patient assisted to bathroom and back in bed

## 2018-01-20 NOTE — Telephone Encounter (Signed)
  FYI...  Pt states that her abdominal pain has become severe and now with nausea.  She wanted to let you know she went to the ER to be evaluated.   Thanks,   -Mickel Baas

## 2018-01-20 NOTE — ED Notes (Signed)
Responded to pt request to urinate. Pt ambulated to bathroom with assistance. Pt returned to stretcher and resting comfortably.
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
## 2018-01-20 NOTE — ED Notes (Signed)
Patient asking to let doctor know at this time she is hurting 5/6 out of 10. MD made aware and reported patient can have nausea medicine and pain medicine. Patient refused all medicine at this time.

## 2018-01-20 NOTE — Telephone Encounter (Signed)
-----   Message from Birdie Sons, MD sent at 01/20/2018 11:21 AM EST ----- Labs and xrays all completely normal. Pain is likely intestinal spasm and should improve with hyoscyamine. If not much better in 3-4 days may need to get abdominal CT.

## 2018-01-22 LAB — CULTURE, URINE COMPREHENSIVE

## 2018-01-23 ENCOUNTER — Emergency Department
Admission: EM | Admit: 2018-01-23 | Discharge: 2018-01-23 | Disposition: A | Discharge status: Home / Self Care | Payer: Medicaid Other | Attending: Emergency Medicine | Admitting: Emergency Medicine

## 2018-01-23 ENCOUNTER — Emergency Department: Payer: Medicaid Other

## 2018-01-23 ENCOUNTER — Encounter: Payer: Self-pay | Admitting: Emergency Medicine

## 2018-01-23 ENCOUNTER — Other Ambulatory Visit: Payer: Self-pay

## 2018-01-23 DIAGNOSIS — Z79899 Other long term (current) drug therapy: Secondary | ICD-10-CM | POA: Insufficient documentation

## 2018-01-23 DIAGNOSIS — F329 Major depressive disorder, single episode, unspecified: Secondary | ICD-10-CM | POA: Insufficient documentation

## 2018-01-23 DIAGNOSIS — K5900 Constipation, unspecified: Secondary | ICD-10-CM

## 2018-01-23 DIAGNOSIS — R112 Nausea with vomiting, unspecified: Secondary | ICD-10-CM | POA: Diagnosis present

## 2018-01-23 DIAGNOSIS — R109 Unspecified abdominal pain: Secondary | ICD-10-CM

## 2018-01-23 LAB — CBC WITH DIFFERENTIAL/PLATELET
Abs Immature Granulocytes: 0.03 10*3/uL (ref 0.00–0.07)
BASOS ABS: 0 10*3/uL (ref 0.0–0.1)
Basophils Relative: 0 %
Eosinophils Absolute: 0.1 10*3/uL (ref 0.0–0.5)
Eosinophils Relative: 1 %
HCT: 45.9 % (ref 36.0–46.0)
Hemoglobin: 14.9 g/dL (ref 12.0–15.0)
Immature Granulocytes: 0 %
LYMPHS ABS: 1.6 10*3/uL (ref 0.7–4.0)
Lymphocytes Relative: 21 %
MCH: 26.5 pg (ref 26.0–34.0)
MCHC: 32.5 g/dL (ref 30.0–36.0)
MCV: 81.7 fL (ref 80.0–100.0)
Monocytes Absolute: 0.8 10*3/uL (ref 0.1–1.0)
Monocytes Relative: 11 %
Neutro Abs: 5.3 10*3/uL (ref 1.7–7.7)
Neutrophils Relative %: 67 %
Platelets: 224 10*3/uL (ref 150–400)
RBC: 5.62 MIL/uL — AB (ref 3.87–5.11)
RDW: 13.7 % (ref 11.5–15.5)
WBC: 7.8 10*3/uL (ref 4.0–10.5)
nRBC: 0 % (ref 0.0–0.2)

## 2018-01-23 LAB — COMPREHENSIVE METABOLIC PANEL
ALBUMIN: 4.8 g/dL (ref 3.5–5.0)
ALT: 11 U/L (ref 0–44)
AST: 16 U/L (ref 15–41)
Alkaline Phosphatase: 68 U/L (ref 38–126)
Anion gap: 8 (ref 5–15)
BUN: 13 mg/dL (ref 6–20)
CALCIUM: 9.4 mg/dL (ref 8.9–10.3)
CO2: 29 mmol/L (ref 22–32)
Chloride: 100 mmol/L (ref 98–111)
Creatinine, Ser: 1.01 mg/dL — ABNORMAL HIGH (ref 0.44–1.00)
GFR calc Af Amer: >60 mL/min (ref >60)
GFR calc non Af Amer: >60 mL/min (ref >60)
Glucose, Bld: 101 mg/dL — ABNORMAL HIGH (ref 70–99)
Potassium: 4.3 mmol/L (ref 3.5–5.1)
Sodium: 137 mmol/L (ref 135–145)
Total Bilirubin: 1 mg/dL (ref 0.3–1.2)
Total Protein: 8.2 g/dL — ABNORMAL HIGH (ref 6.5–8.1)

## 2018-01-23 LAB — LIPASE, BLOOD: Lipase: 29 U/L (ref 11–51)

## 2018-01-23 MED ORDER — ONDANSETRON HCL 4 MG/2ML IJ SOLN: 4.0000 mg | Freq: Once | INTRAMUSCULAR | Status: DC

## 2018-01-23 MED ORDER — SODIUM CHLORIDE 0.9 % IV BOLUS: 1000.0000 mL | Freq: Once | INTRAVENOUS | Status: DC

## 2018-01-23 NOTE — ED Triage Notes (Signed)
States no BM x 4 days. States was seen 2 days ago and sent home with inst for laxatives. Has only had one tiny BM. Vomiting started today.

## 2018-01-23 NOTE — ED Notes (Signed)
Called to room by pt stating she is having increased abdominal pain.  Explained to pt that she has to be evaluated by MD prior to receiving pain medications.  Explained that MD is involved with a critical patient at the moment and that he will be with her as soon as he is finished.  Also explained that as soon as RN receives orders, she will provide any medications necessary.  Pt verbalizes understanding of all information shared.  PT repositioned for comfort.
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
## 2018-01-23 NOTE — ED Notes (Signed)
Pt states multiple trips to [facility] after enema.  States she is feeling better.  Dr. Kerman Passey aware of same.

## 2018-01-25 ENCOUNTER — Other Ambulatory Visit: Payer: Self-pay

## 2018-01-25 DIAGNOSIS — R197 Diarrhea, unspecified: Secondary | ICD-10-CM

## 2018-01-25 DIAGNOSIS — R110 Nausea: Secondary | ICD-10-CM

## 2018-01-25 DIAGNOSIS — R1084 Generalized abdominal pain: Secondary | ICD-10-CM

## 2018-01-25 MED ORDER — ONDANSETRON HCL 4 MG PO TABS: 4.0000 mg | ORAL_TABLET | Freq: Three times a day (TID) | ORAL | 0 refills | Status: DC | PRN

## 2018-01-25 NOTE — Telephone Encounter (Signed)
Pt advised as directed below.  She agreed to proceed with the referral to GI.  RX sent to [facility].   Thanks,   -Mickel Baas

## 2018-01-25 NOTE — Telephone Encounter (Signed)
Patient calling that she went to the ED but that she still has diarrhea and vomiting and feeling very nauseas. She would like to talk to Dr. Caryn Section advised patient that messages is going to be send to his nurses.

## 2018-01-25 NOTE — Telephone Encounter (Signed)
Can send in prescription for ondansetron for nausea. Take OTC imodium as needed for diarrhea and need referral to GI for additional work up.  If diarrhea is not better by Thursday then return to order stool tests.

## 2018-01-27 ENCOUNTER — Ambulatory Visit: Payer: Medicaid Other | Admitting: Family Medicine

## 2018-01-27 ENCOUNTER — Emergency Department: Payer: Medicaid Other

## 2018-01-27 ENCOUNTER — Encounter: Payer: Self-pay | Admitting: Family Medicine

## 2018-01-27 ENCOUNTER — Encounter: Payer: Self-pay | Admitting: Emergency Medicine

## 2018-01-27 ENCOUNTER — Telehealth: Payer: Self-pay

## 2018-01-27 ENCOUNTER — Emergency Department
Admission: EM | Admit: 2018-01-27 | Discharge: 2018-01-27 | Disposition: A | Discharge status: Home / Self Care | Payer: Medicaid Other | Attending: Emergency Medicine | Admitting: Emergency Medicine

## 2018-01-27 VITALS — BP 104/60 | HR 64 | Temp 98.1°F | Resp 16 | Wt 128.0 lb

## 2018-01-27 DIAGNOSIS — E860 Dehydration: Secondary | ICD-10-CM | POA: Diagnosis not present

## 2018-01-27 DIAGNOSIS — R079 Chest pain, unspecified: Secondary | ICD-10-CM | POA: Diagnosis not present

## 2018-01-27 DIAGNOSIS — Z79899 Other long term (current) drug therapy: Secondary | ICD-10-CM | POA: Diagnosis not present

## 2018-01-27 DIAGNOSIS — R1084 Generalized abdominal pain: Secondary | ICD-10-CM | POA: Insufficient documentation

## 2018-01-27 DIAGNOSIS — K529 Noninfective gastroenteritis and colitis, unspecified: Secondary | ICD-10-CM | POA: Diagnosis not present

## 2018-01-27 DIAGNOSIS — R300 Dysuria: Secondary | ICD-10-CM | POA: Diagnosis not present

## 2018-01-27 DIAGNOSIS — R1110 Vomiting, unspecified: Secondary | ICD-10-CM | POA: Diagnosis present

## 2018-01-27 LAB — COMPREHENSIVE METABOLIC PANEL
ALT: 11 U/L (ref 0–44)
AST: 19 U/L (ref 15–41)
Albumin: 5 g/dL (ref 3.5–5.0)
Alkaline Phosphatase: 68 U/L (ref 38–126)
Anion gap: 11 (ref 5–15)
BUN: 31 mg/dL — AB (ref 6–20)
CO2: 25 mmol/L (ref 22–32)
Calcium: 10.5 mg/dL — ABNORMAL HIGH (ref 8.9–10.3)
Chloride: 101 mmol/L (ref 98–111)
Creatinine, Ser: 1 mg/dL (ref 0.44–1.00)
GFR calc Af Amer: >60 mL/min (ref >60)
GFR calc non Af Amer: >60 mL/min (ref >60)
Glucose, Bld: 99 mg/dL (ref 70–99)
POTASSIUM: 4.1 mmol/L (ref 3.5–5.1)
Sodium: 137 mmol/L (ref 135–145)
Total Bilirubin: 2 mg/dL — ABNORMAL HIGH (ref 0.3–1.2)
Total Protein: 8.5 g/dL — ABNORMAL HIGH (ref 6.5–8.1)

## 2018-01-27 LAB — POCT URINALYSIS DIPSTICK
Bilirubin, UA: NEGATIVE
Glucose, UA: NEGATIVE
Nitrite, UA: NEGATIVE
Protein, UA: POSITIVE — AB
Spec Grav, UA: >=1.03 — AB (ref 1.010–1.025)
Urobilinogen, UA: 0.2 E.U./dL
pH, UA: 6 (ref 5.0–8.0)

## 2018-01-27 LAB — CBC
HCT: 48.1 % — ABNORMAL HIGH (ref 36.0–46.0)
HEMOGLOBIN: 15.8 g/dL — AB (ref 12.0–15.0)
MCH: 26.3 pg (ref 26.0–34.0)
MCHC: 32.8 g/dL (ref 30.0–36.0)
MCV: 80.2 fL (ref 80.0–100.0)
Platelets: 236 10*3/uL (ref 150–400)
RBC: 6 MIL/uL — ABNORMAL HIGH (ref 3.87–5.11)
RDW: 13.9 % (ref 11.5–15.5)
WBC: 10.2 10*3/uL (ref 4.0–10.5)
nRBC: 0 % (ref 0.0–0.2)

## 2018-01-27 LAB — TROPONIN I: Troponin I: <0.03 ng/mL (ref <0.03)

## 2018-01-27 LAB — LIPASE, BLOOD: Lipase: 34 U/L (ref 11–51)

## 2018-01-27 MED ORDER — IOPAMIDOL (ISOVUE-300) INJECTION 61%
100.0000 mL | Freq: Once | INTRAVENOUS | Status: AC | PRN
  Administered 2018-01-27: 100 mL via INTRAVENOUS

## 2018-01-27 MED ORDER — PROMETHAZINE HCL 25 MG PO TABS: 25.0000 mg | ORAL_TABLET | Freq: Four times a day (QID) | ORAL | 0 refills | Status: DC | PRN

## 2018-01-27 MED ORDER — ONDANSETRON HCL 4 MG/2ML IJ SOLN
4.0000 mg | Freq: Once | INTRAMUSCULAR | Status: AC
  Administered 2018-01-27: 4 mg via INTRAVENOUS
  Filled 2018-01-27: qty 2

## 2018-01-27 MED ORDER — CEPHALEXIN 500 MG PO CAPS: 500.0000 mg | ORAL_CAPSULE | Freq: Three times a day (TID) | ORAL | 0 refills | Status: AC

## 2018-01-27 MED ORDER — SODIUM CHLORIDE 0.9 % IV BOLUS
1000.0000 mL | Freq: Once | INTRAVENOUS | Status: AC
  Administered 2018-01-27: 1000 mL via INTRAVENOUS

## 2018-01-27 MED ORDER — PROMETHAZINE HCL 25 MG/ML IJ SOLN
25.0000 mg | Freq: Once | INTRAMUSCULAR | Status: AC
  Administered 2018-01-27: 25 mg via INTRAMUSCULAR

## 2018-01-27 NOTE — ED Triage Notes (Signed)
Pt reports was seen here 2 days ago for NV and Abd pain.

## 2018-01-27 NOTE — Progress Notes (Signed)
Patient: Erin Richards Female    DOB: August 02, 1965   52 y.o.   MRN: [id number] Visit Date: 01/27/2018  Today's Provider: Lelon Huh, MD   Chief Complaint  Patient presents with  . Emesis  . Nausea   Subjective:     Emesis   This is a recurrent problem. The current episode started yesterday (worsened ). The problem occurs intermittently. The problem has been unchanged. There has been no fever. Associated symptoms include abdominal pain and sweats. Pertinent negatives include no coughing, diarrhea, dizziness, headaches or myalgias. She has tried nothing for the symptoms.    Patient reports that she has had an upset stomach that started after ER on 01/23/18 (4 days ago). She was seen for abdominal pain and constipation and given enema. She called office on 01-25-2018 complaining of nausea and diarrhea and called in ondansetron which she states hasn't helped nausea at all.. She denies any fever, but did start having some urinary burning yesterday. She states she is now having slightly loose bowel movements, but no diarrhea. No blood I nstool or emesis.  Has not had any trouble with constipation since ER visit on 12-22.   Allergies  Allergen Reactions  . Acetaminophen-Codeine Nausea And Vomiting  . Antiseptic Oral Rinse  [Cetylpyridinium Chloride] Other (See Comments)    UNK  . Aspartame Other (See Comments)    Reaction: unknown  . Biaxin [Clarithromycin] Nausea And Vomiting  . Carafate [Sucralfate]     Pt says her "Stomach hurts" when she takes it.    . Chlorhexidine Gluconate Nausea And Vomiting  . Clindamycin/Lincomycin Nausea And Vomiting  . Codeine Itching and Nausea And Vomiting  . Dextromethorphan Hbr Other (See Comments)    Reaction: unknown  . Dilaudid [Hydromorphone Hcl] Nausea And Vomiting  . Doxycycline Nausea And Vomiting  . Fentanyl Nausea And Vomiting  . Fluticasone-Salmeterol Other (See Comments)    Blister in mouth Other reaction(s): Other (See  Comments) Blister in mouth  . Germanium Other (See Comments)  . Hydrocodone Nausea And Vomiting  . Ketorolac Other (See Comments)  . Levofloxacin Other (See Comments)    GI upset. GI upset  . Mefenamic Acid Nausea And Vomiting  . Metformin And Related Nausea And Vomiting  . Metronidazole Diarrhea and Nausea And Vomiting  . Morphine And Related Nausea And Vomiting  . Moxifloxacin Swelling  . Nitrofurantoin Nausea And Vomiting and Other (See Comments)  . Nsaids Other (See Comments)    Reaction: unknown  . Oxycodone-Acetaminophen Nausea And Vomiting  . Periguard [Dimethicone] Nausea And Vomiting  . Phenothiazines Nausea And Vomiting  . Pioglitazone Nausea And Vomiting  . Quinidine Nausea And Vomiting  . Quinolones Nausea And Vomiting  . Rumex Crispus Other (See Comments)  . Tetracyclines & Related Nausea And Vomiting  . Toradol [Ketorolac Tromethamine] Nausea And Vomiting  . Tramadol Nausea And Vomiting  . Tussin [Guaifenesin] Nausea And Vomiting  . Tussionex Pennkinetic Er [Hydrocod Polst-Cpm Polst Er] Nausea And Vomiting  . Buprenorphine Hcl Nausea And Vomiting  . Lincomycin Hcl Nausea And Vomiting  . Oxycodone-Acetaminophen Hives and Nausea And Vomiting    Other reaction(s): Nausea And Vomiting, Unknown  . Phenylalanine Nausea And Vomiting     Current Outpatient Medications:  .  albuterol (PROAIR HFA) 108 (90 Base) MCG/ACT inhaler, Inhale 1-2 puffs into the lungs every 6 (six) hours as needed for shortness of breath., Disp: 8.5 Inhaler, Rfl: 5 .  azelastine (ASTELIN) 0.1 % nasal spray, Place 1  spray into both nostrils 2 (two) times daily., Disp: 30 mL, Rfl: 12 .  cetirizine (ZYRTEC) 10 MG tablet, Take 1 tablet (10 mg total) by mouth daily., Disp: 30 tablet, Rfl: 11 .  citalopram (CELEXA) 10 MG tablet, Take 1 tablet (10 mg total) by mouth daily., Disp: 30 tablet, Rfl: 12 .  COMBIVENT RESPIMAT 20-100 MCG/ACT AERS respimat, INHALE 1 PUFF INTO THE LUNGS EVERY 4 (FOUR) HOURS AS  NEEDED FOR WHEEZING., Disp: 1 Inhaler, Rfl: 5 .  fexofenadine (ALLEGRA) 60 MG tablet, Take 1 tablet (60 mg total) by mouth 2 (two) times daily., Disp: 60 tablet, Rfl: 4 .  fluticasone (FLONASE) 50 MCG/ACT nasal spray, Place 2 sprays into both nostrils daily as needed for allergies or rhinitis. Reported on 04/03/2015, Disp: 16 g, Rfl: 5 .  gabapentin (NEURONTIN) 300 MG capsule, TAKE 1 CAPSULE (300 MG TOTAL) BY MOUTH DAILY., Disp: 30 capsule, Rfl: 11 .  hyoscyamine (LEVSIN, ANASPAZ) 0.125 MG tablet, TAKE ONE TABLET BY MOUTH EVERY 6 HOURS AS NEEDED FOR CRAMPING FOR UP TO 10 DAYS, Disp: 30 tablet, Rfl: 5 .  ipratropium (ATROVENT) 0.03 % nasal spray, Place 2 sprays into the nose daily as needed., Disp: 30 mL, Rfl: 3 .  loperamide (IMODIUM A-D) 2 MG tablet, Take 1 tablet (2 mg total) by mouth 4 (four) times daily as needed for diarrhea or loose stools., Disp: 30 tablet, Rfl: 0 .  meloxicam (MOBIC) 15 MG tablet, Take 1 tablet (15 mg total) by mouth daily., Disp: 30 tablet, Rfl: 3 .  montelukast (SINGULAIR) 10 MG tablet, TAKE 1 TABLET (10 MG TOTAL) BY MOUTH AT BEDTIME., Disp: 30 tablet, Rfl: 12 .  nortriptyline (PAMELOR) 50 MG capsule, Take 1 capsule (50 mg total) by mouth daily as needed (headache)., Disp: 30 capsule, Rfl: 5 .  pantoprazole (PROTONIX) 40 MG tablet, TAKE 1 TABLET BY MOUTH TWICE A DAY, Disp: 60 tablet, Rfl: 5 .  polyethylene glycol (MIRALAX / GLYCOLAX) packet, Take 17 g by mouth daily., Disp: 14 each, Rfl: 0 .  estradiol (ESTRACE) 0.5 MG tablet, TAKE 1 TABLET BY MOUTH EVERY DAY (Patient not taking: Reported on 01/19/2018), Disp: 30 tablet, Rfl: 5 .  methylPREDNISolone (MEDROL DOSEPAK) 4 MG TBPK tablet, 6 day dose pack - take as directed (Patient not taking: Reported on 01/19/2018), Disp: 21 tablet, Rfl: 0 .  ondansetron (ZOFRAN) 4 MG tablet, Take 1 tablet (4 mg total) by mouth every 8 (eight) hours as needed for nausea or vomiting., Disp: 12 tablet, Rfl: 0 .  ranitidine (ZANTAC) 150 MG tablet,  Take 1 tablet (150 mg total) by mouth 2 (two) times daily. (Patient not taking: Reported on 01/19/2018), Disp: 30 tablet, Rfl: 5 .  tiotropium (SPIRIVA) 18 MCG inhalation capsule, Place 1 capsule (18 mcg total) into inhaler and inhale daily., Disp: 30 capsule, Rfl: 12  Review of Systems  Constitutional: Positive for activity change and fatigue.  Respiratory: Negative for cough and shortness of breath.   Gastrointestinal: Positive for abdominal pain, constipation, nausea and vomiting. Negative for abdominal distention, anal bleeding, blood in stool, diarrhea and rectal pain.  Genitourinary: Negative.   Musculoskeletal: Negative for back pain and myalgias.  Skin: Negative.   Neurological: Positive for light-headedness. Negative for dizziness, tremors, syncope, weakness and headaches.    Social History   Tobacco Use  . Smoking status: Never Smoker  . Smokeless tobacco: Never Used  Substance Use Topics  . Alcohol use: No      Objective:   BP 104/60 (BP  Location: Left Arm, Patient Position: Sitting, Cuff Size: Normal)   Pulse 64   Temp 98.1 F (36.7 C)   Resp 16   Wt 128 lb (58.1 kg)   SpO2 95%   BMI 27.70 kg/m  Vitals:   01/27/18 1040  BP: 104/60  Pulse: 64  Resp: 16  Temp: 98.1 F (36.7 C)  SpO2: 95%  Weight: 128 lb (58.1 kg)     Physical Exam   General Appearance:    Alert, cooperative, occasionally vomiting during her visit. Very fatigued in appearance.   Eyes:    PERRL, conjunctiva/corneas clear, EOM's intact       Lungs:     Clear to auscultation bilaterally, respirations unlabored  Heart:    Regular rate and rhythm  Neurologic:   Awake, alert, oriented x 3. No apparent focal neurological           defect.   Abd:   Diffuse abdominal tenderness, no focal tenderness. No rebound tenderness. No CVA tenderness.    Results for orders placed or performed in visit on 01/27/18  POCT urinalysis dipstick  Result Value Ref Range   Color, UA yellow    Clarity, UA cloudy     Glucose, UA Negative Negative   Bilirubin, UA negative    Ketones, UA small    Spec Grav, UA >=1.030 (A) 1.010 - 1.025   Blood, UA hemolyzed small    pH, UA 6.0 5.0 - 8.0   Protein, UA Positive (A) Negative   Urobilinogen, UA 0.2 0.2 or 1.0 E.U./dL   Nitrite, UA negative    Leukocytes, UA Trace (A) Negative       Assessment & Plan    1. Gastroenteritis  - promethazine (PHENERGAN) 25 MG tablet; Take 1 tablet (25 mg total) by mouth every 6 (six) hours as needed for nausea or vomiting.  Dispense: 12 tablet; Refill: 0 - POCT urinalysis dipstick  - promethazine (PHENERGAN) injection 25 mg  Encourage to drink frequent small sips of clear liquids.   2. Dysuria  - cephALEXin (KEFLEX) 500 MG capsule; Take 1 capsule (500 mg total) by mouth 3 (three) times daily for 7 days.  Dispense: 21 capsule; Refill: 0 - Urine Culture    Lelon Huh, MD  [facility]   Patient called office about 20 minutes after leaving, reporting she is no vomiting pink specs. She was advised to go to emergency department immediately to rule out upper GI bleed.

## 2018-01-27 NOTE — Patient Instructions (Addendum)
. Please bring all of your medications to every appointment so we can make sure that our medication list is the same as yours.    Viral Gastroenteritis, Adult  Viral gastroenteritis is also known as the stomach flu. This condition is caused by various viruses. These viruses can be passed from person to person very easily (are very contagious). This condition may affect your stomach, small intestine, and large intestine. It can cause sudden watery diarrhea, fever, and vomiting. Diarrhea and vomiting can make you feel weak and cause you to become dehydrated. You may not be able to keep fluids down. Dehydration can make you tired and thirsty, cause you to have a dry mouth, and decrease how often you urinate. Older adults and people with other diseases or a weak immune system are at higher risk for dehydration. It is important to replace the fluids that you lose from diarrhea and vomiting. If you become severely dehydrated, you may need to get fluids through an IV tube. What are the causes? Gastroenteritis is caused by various viruses, including rotavirus and norovirus. Norovirus is the most common cause in adults. You can get sick by eating food, drinking water, or touching a surface contaminated with one of these viruses. You can also get sick from sharing utensils or other personal items with an infected person. What increases the risk? This condition is more likely to develop in people:  Who have a weak defense system (immune system).  Who live with one or more children who are younger than 45 years old.  Who live in a nursing home.  Who go on cruise ships. What are the signs or symptoms? Symptoms of this condition start suddenly 1-2 days after exposure to a virus. Symptoms may last a few days or as long as a week. The most common symptoms are watery diarrhea and vomiting. Other symptoms include:  Fever.  Headache.  Fatigue.  Pain in the  abdomen.  Chills.  Weakness.  Nausea.  Muscle aches.  Loss of appetite. How is this diagnosed? This condition is diagnosed with a medical history and physical exam. You may also have a stool test to check for viruses or other infections. How is this treated? This condition typically goes away on its own. The focus of treatment is to restore lost fluids (rehydration). Your health care provider may recommend that you take an oral rehydration solution (ORS) to replace important salts and minerals (electrolytes) in your body. Severe cases of this condition may require giving fluids through an IV tube. Treatment may also include medicine to help with your symptoms. Follow these instructions at home:  Follow instructions from your health care provider about how to care for yourself at home. Follow these recommendations as told by your health care provider:  Take an ORS. This is a drink that is sold at pharmacies and retail stores.  Drink clear fluids in small amounts as you are able. Clear fluids include water, ice chips, diluted fruit juice, and low-calorie sports drinks.  Eat bland, easy-to-digest foods in small amounts as you are able. These foods include bananas, applesauce, rice, lean meats, toast, and crackers.  Avoid fluids that contain a lot of sugar or caffeine, such as energy drinks, sports drinks, and soda.  Avoid alcohol.  Avoid spicy or fatty foods. General instructions  Drink enough fluid to keep your urine clear or pale yellow.  Wash your hands often. If soap and water are not available, use hand sanitizer.  Make sure that all  people in your household wash their hands well and often.  Take over-the-counter and prescription medicines only as told by your health care provider.  Rest at home while you recover.  Watch your condition for any changes.  Take a warm bath to relieve any burning or pain from frequent diarrhea episodes.  Keep all follow-up visits as told  by your health care provider. This is important. Contact a health care provider if:  You cannot keep fluids down.  Your symptoms get worse.  You have new symptoms.  You feel light-headed or dizzy.  You have muscle cramps. Get help right away if:  You have chest pain.  You feel extremely weak or you faint.  You see blood in your vomit.  Your vomit looks like coffee grounds.  You have bloody or black stools or stools that look like tar.  You have a severe headache, a stiff neck, or both.  You have a rash.  You have severe pain, cramping, or bloating in your abdomen.  You have trouble breathing or you are breathing very quickly.  Your heart is beating very quickly.  Your skin feels cold and clammy.  You feel confused.  You have pain when you urinate.  You have signs of dehydration, such as: ? Dark urine, very little urine, or no urine. ? Cracked lips. ? Dry mouth. ? Sunken eyes. ? Sleepiness. ? Weakness. This information is not intended to replace advice given to you by your health care provider. Make sure you discuss any questions you have with your health care provider. Document Released: 01/19/2005 Document Revised: 09/03/2016 Document Reviewed: 09/25/2014 Elsevier Interactive Patient Education  2019 [employer].

## 2018-01-27 NOTE — Telephone Encounter (Signed)
FYI...  Pt called in stating she just left her appointment here.  She received a "shot" for nausea and vomiting.  She reports she is now "throwing up pink stuff".  I advised pt she should go to the ER to be evaluated she refused to go and wanted me to "talk to Dr. Caryn Section first."  I advised Dr. Caryn Section of pt's symptoms and he also suggested Ms. Lacross needs to go the ER.  I advised pt and she stated "Okay, whatever" and hung up the phone.   Thanks,   -Mickel Baas

## 2018-01-27 NOTE — ED Notes (Signed)
Ct abd completed patient discharged to home.
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
## 2018-01-27 NOTE — Discharge Instructions (Signed)
Your labs vital signs and CT scan all look okay today.  Increase your water intake to stay well-hydrated.  An over-the-counter acid reducer such as Zantac or Pepcid can help with your symptoms as well.

## 2018-01-27 NOTE — ED Triage Notes (Signed)
Says sent from dr office because seh is throwing up pink stuff.

## 2018-01-27 NOTE — ED Notes (Signed)
Patient awaiting md eval and plan of care.

## 2018-01-27 NOTE — ED Triage Notes (Signed)
Pt reports was here 2 days ago for VN and abd pain. Pt staes followed up with her MD today and was given some phenergan and advised to come back to the ED for NV, abd pain and chest pain.

## 2018-01-28 ENCOUNTER — Ambulatory Visit: Payer: Medicaid Other | Admitting: Physical Therapy

## 2018-01-29 LAB — URINE CULTURE

## 2018-01-31 ENCOUNTER — Telehealth: Payer: Self-pay

## 2018-01-31 ENCOUNTER — Encounter: Payer: Medicaid Other | Admitting: Physical Therapy

## 2018-01-31 NOTE — Telephone Encounter (Signed)
-----   Message from Birdie Sons, MD sent at 01/30/2018  6:49 PM EST ----- Urine culture is negative. No sign of infection. Can stop antibiotic (cephalexin)

## 2018-01-31 NOTE — Telephone Encounter (Signed)
Patient was advised.  

## 2018-02-03 ENCOUNTER — Other Ambulatory Visit: Payer: Self-pay | Admitting: Family Medicine

## 2018-02-03 DIAGNOSIS — K219 Gastro-esophageal reflux disease without esophagitis: Secondary | ICD-10-CM

## 2018-02-07 ENCOUNTER — Encounter: Payer: Medicaid Other | Admitting: Physical Therapy

## 2018-02-08 ENCOUNTER — Other Ambulatory Visit: Payer: Self-pay

## 2018-02-08 ENCOUNTER — Encounter: Payer: Self-pay | Admitting: Gastroenterology

## 2018-02-08 ENCOUNTER — Ambulatory Visit: Payer: Medicaid Other | Admitting: Gastroenterology

## 2018-02-08 VITALS — BP 112/66 | HR 73 | Ht <= 58 in | Wt 136.6 lb

## 2018-02-08 DIAGNOSIS — R17 Unspecified jaundice: Secondary | ICD-10-CM

## 2018-02-08 DIAGNOSIS — K760 Fatty (change of) liver, not elsewhere classified: Secondary | ICD-10-CM | POA: Diagnosis not present

## 2018-02-08 DIAGNOSIS — K5900 Constipation, unspecified: Secondary | ICD-10-CM | POA: Diagnosis not present

## 2018-02-08 DIAGNOSIS — K2270 Barrett's esophagus without dysplasia: Secondary | ICD-10-CM

## 2018-02-08 NOTE — Progress Notes (Signed)
Erin Richards [address]  [place], [place] [address]  Main: [phone]  Fax: [phone]   Gastroenterology Consultation  Referring Provider:     Birdie Sons, MD Primary Care Physician:  Birdie Sons, MD Reason for Consultation:     Diarrhea, abdominal pain        HPI:   CC: Constipation  Erin Richards is a 53 y.o. y/o female referred for consultation & management  by Dr. Birdie Sons, MD.  Patient states she went to the ER in December due to nausea vomiting and constipation.  She states she was told she has a "stomach bug" and was discharged, but symptoms continued and so she went back to the ER and felt better after they gave her an enema to relieve constipation.  Patient now reports that she takes Colace every other day and with this she has a small bowel movement daily that is soft.  No blood in stool.  No further abdominal pain or nausea or vomiting.  No fever or chills.  Reports good appetite.  Denies any loose stools or diarrhea.  Last CT abdomen was January 27, 2018 when she went to the ER for abdominal pain and did not show any acute findings.  CT on December 19 reported hepatic steatosis and small hemangioma in the right hepatic lobe.  Fecal loading in the ascending and transverse colon was reported as well.  Patient is also on Protonix once daily and Zantac at bedtime.  She states she was started on these by [facility].  She denies any heartburn whatsoever and denies any dysphagia.  Patient was previously seen by Dr. Corlis [facility] clinic GI.  As per their notes: "She has a hx of IBS-C and antibiotic induced colitis. She has been seen in the ED several times thorugh the years for abdominal pian, diarrhea, rectal bleeding-most recently 05/09/17 with unremarkable CT abd/pelvis.  Colonoscopy on 06/01/2005 for stomach cramps, sharp pain, constipation and hematochezia and that showed significant spasm and hemorrhoids  Colonoscopy  12/30/2009 for rectal bleeding and that showed hemorrhoids only.  Colonoscopy 08/16/2014 for generalized abdominal pain with rectal bleeding. Findings: 2 diminutive polyps in the transverse colon, diffuse and patchy mild inflammation found in the rectum and rectosigmoid colon secondary to left-sided ulcerative colitis. Transverse colon showed mild chronic active colitis consistent with patient's history of ulcerative colitis. Descending colon showed mild chronic active colitis, sigmoid colon mild chronic active colitis and rectal biopsy showed moderate chronic active colitis.  An upper endoscopy performed 08/16/2014 for generalized abdominal pain showed esophageal mucosal changes suspicious for short segment Barrett's esophagus, erythematous mucosa in the antrum, normal examined duodenum. Pathology of the stomach showed focal mild chronic gastritis nonspecific. Negative for H. pylori. Distal esophagus was negative for Barrett's.  She was hospitalized 10/06/2014 through 10/12/2014 for C diff colitis.   She had hemorrhoid banding by Dr. Rochel Brome in 2017.   ED 03/22/2016 for upper abd pain. ED 07/20/2016 chest pain ED 09/13/2016; left flank pain ED 11/05/2016: LLQ abd pain ED 05/09/2017: ED gastroenteritis  Chart review shows with her most recent colonoscopy Dr. Vira Agar did not think this qualified as inflammatory bowel disease type ulcerative colitis. It was thought that her colitis was secondary to antibiotics. She has not been on treatment for UC since she tried one Canasa suppository in 2017 and it hurt her stomach and caused nausea so she did not take it anymore.She had subsequent hemorrhoid banding."  She  had a repeat colonoscopy in July 2019 with Dr. Vira Agar, which showed excellent prep, extent of exam terminal ileum, internal hemorrhoids reported.  Biopsies performed throughout the colon.  DIAGNOSIS:  A. ASCENDING COLON; COLD BIOPSY:  - PROMINENT LYMPHOID AGGREGATE AND MILD CRYPT  IRREGULARITIES.  - NEGATIVE FOR ACTIVE COLITIS, DYSPLASIA AND MALIGNANCY.   B. TRANSVERSE COLON; COLD BIOPSY:  - PROMINENT LYMPHOID AGGREGATE AND MILD CRYPT IRREGULARITIES.  - NEGATIVE FOR ACTIVE COLITIS, DYSPLASIA AND MALIGNANCY.   C. DESCENDING COLON; COLD BIOPSY:  - PROMINENT LYMPHOID AGGREGATE.  - NEGATIVE FOR COLITIS, DYSPLASIA AND MALIGNANCY.   D. RECTOSIGMOID COLON; COLD BIOPSY:  - PROMINENT LYMPHOID AGGREGATE.  - NEGATIVE FOR COLITIS, DYSPLASIA AND MALIGNANCY.   Past Medical History:  Diagnosis Date  . Allergy   . ASD (atrial septal defect)   . Clostridium difficile colitis 10/07/2014  . Concussion 07/03/2013  . Depression   . Dyspnea   . Headache    migraines  . History of Clostridium difficile colitis 09/24/2015  . IBS (irritable bowel syndrome)   . 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 CARDIAC SURGERY    . COLONOSCOPY WITH PROPOFOL N/A 08/16/2014   Procedure: COLONOSCOPY WITH PROPOFOL;  Surgeon: Manya Silvas, MD;  Location: [facility] ENDOSCOPY;  Service: Endoscopy;  Laterality: N/A;  . COLONOSCOPY WITH PROPOFOL N/A 08/27/2017   Procedure: COLONOSCOPY WITH PROPOFOL;  Surgeon: Manya Silvas, MD;  Location: [facility] ENDOSCOPY;  Service: Endoscopy;  Laterality: N/A;  . ESOPHAGOGASTRODUODENOSCOPY (EGD) WITH PROPOFOL  08/16/2014   Procedure: ESOPHAGOGASTRODUODENOSCOPY (EGD) WITH PROPOFOL;  Surgeon: Manya Silvas, MD;  Location: [facility] ENDOSCOPY;  Service: Endoscopy;;  . TOTAL ABDOMINAL HYSTERECTOMY W/ BILATERAL SALPINGOOPHORECTOMY  01/08/2009   supracervical    Prior to Admission medications   Medication Sig Start Date End Date Taking? Authorizing Provider  albuterol (PROAIR HFA) 108 (90 Base) MCG/ACT inhaler Inhale 1-2 puffs into the lungs every 6 (six) hours as needed for shortness of breath. 11/25/15   Birdie Sons, MD  azelastine (ASTELIN) 0.1 % nasal spray Place 1 spray into both nostrils  2 (two) times daily. 06/23/17   Birdie Sons, MD  cetirizine (ZYRTEC) 10 MG tablet Take 1 tablet (10 mg total) by mouth daily. 06/02/16   Birdie Sons, MD  citalopram (CELEXA) 10 MG tablet Take 1 tablet (10 mg total) by mouth daily. 11/24/17   Birdie Sons, MD  COMBIVENT RESPIMAT 20-100 MCG/ACT AERS respimat INHALE 1 PUFF INTO THE LUNGS EVERY 4 (FOUR) HOURS AS NEEDED FOR WHEEZING. 02/03/17   Birdie Sons, MD  estradiol (ESTRACE) 0.5 MG tablet TAKE 1 TABLET BY MOUTH EVERY DAY Patient not taking: Reported on 01/19/2018 10/23/17   Birdie Sons, MD  fexofenadine (ALLEGRA) 60 MG tablet Take 1 tablet (60 mg total) by mouth 2 (two) times daily. 01/06/17   Birdie Sons, MD  fluticasone (FLONASE) 50 MCG/ACT nasal spray Place 2 sprays into both nostrils daily as needed for allergies or rhinitis. Reported on 04/03/2015 06/23/17   Birdie Sons, MD  gabapentin (NEURONTIN) 300 MG capsule TAKE 1 CAPSULE (300 MG TOTAL) BY MOUTH DAILY. 02/26/17   Birdie Sons, MD  hyoscyamine (LEVSIN, ANASPAZ) 0.125 MG tablet TAKE ONE TABLET BY MOUTH EVERY 6 HOURS AS NEEDED FOR CRAMPING FOR UP TO 10 DAYS 04/23/17   Birdie Sons, MD  ipratropium (ATROVENT) 0.03 % nasal spray Place 2 sprays into the nose daily as  needed. 06/23/17   Birdie Sons, MD  loperamide (IMODIUM A-D) 2 MG tablet Take 1 tablet (2 mg total) by mouth 4 (four) times daily as needed for diarrhea or loose stools. 03/31/15   Hower, Aaron Mose, MD  meloxicam (MOBIC) 15 MG tablet Take 1 tablet (15 mg total) by mouth daily. 01/10/18   Hyatt, Max T, DPM  methylPREDNISolone (MEDROL DOSEPAK) 4 MG TBPK tablet 6 day dose pack - take as directed Patient not taking: Reported on 01/19/2018 01/10/18   Hyatt, Max T, DPM  montelukast (SINGULAIR) 10 MG tablet TAKE 1 TABLET (10 MG TOTAL) BY MOUTH AT BEDTIME. 10/05/16   Birdie Sons, MD  nortriptyline (PAMELOR) 50 MG capsule Take 1 capsule (50 mg total) by mouth daily as needed (headache). 07/23/15   Margarita Rana, MD  pantoprazole (PROTONIX) 40 MG tablet TAKE 1 TABLET BY MOUTH TWICE A DAY 02/03/18   Birdie Sons, MD  polyethylene glycol [facility] / GLYCOLAX) packet Take 17 g by mouth daily. 01/20/18   Nena Polio, MD  promethazine (PHENERGAN) 25 MG tablet Take 1 tablet (25 mg total) by mouth every 6 (six) hours as needed for nausea or vomiting. 01/27/18   Birdie Sons, MD  ranitidine (ZANTAC) 150 MG tablet Take 1 tablet (150 mg total) by mouth 2 (two) times daily. Patient not taking: Reported on 01/19/2018 01/05/18   Birdie Sons, MD  tiotropium (SPIRIVA) 18 MCG inhalation capsule Place 1 capsule (18 mcg total) into inhaler and inhale daily. 07/27/17 08/26/17  Laverle Hobby, MD    Family History  Problem Relation Age of Onset  . Heart disease Father   . Cancer Father   . Cancer Sister        BREAST     Social History   Tobacco Use  . Smoking status: Never Smoker  . Smokeless tobacco: Never Used  Substance Use Topics  . Alcohol use: No  . Drug use: No    Allergies as of 02/08/2018 - Review Complete 01/27/2018  Allergen Reaction Noted  . Acetaminophen-codeine Nausea And Vomiting 06/03/2014  . Antiseptic oral rinse  [cetylpyridinium chloride] Other (See Comments) 08/21/2014  . Aspartame Other (See Comments) 08/21/2014  . Biaxin [clarithromycin] Nausea And Vomiting 06/03/2014  . Carafate [sucralfate]  04/03/2015  . Chlorhexidine gluconate Nausea And Vomiting 06/03/2014  . Clindamycin/lincomycin Nausea And Vomiting 06/03/2014  . Codeine Itching and Nausea And Vomiting 08/21/2014  . Dextromethorphan hbr Other (See Comments) 08/21/2014  . Dilaudid [hydromorphone hcl] Nausea And Vomiting 06/03/2014  . Doxycycline Nausea And Vomiting 06/03/2014  . Fentanyl Nausea And Vomiting 06/03/2014  . Fluticasone-salmeterol Other (See Comments) 07/27/2017  . Germanium Other (See Comments) 08/21/2014  . Hydrocodone Nausea And Vomiting 06/03/2014  . Ketorolac Other (See Comments)  08/21/2014  . Levofloxacin Other (See Comments) 08/21/2014  . Mefenamic acid Nausea And Vomiting 06/03/2014  . Metformin and related Nausea And Vomiting 06/03/2014  . Metronidazole Diarrhea and Nausea And Vomiting 08/21/2014  . Morphine and related Nausea And Vomiting 06/03/2014  . Moxifloxacin Swelling 08/21/2014  . Nitrofurantoin Nausea And Vomiting and Other (See Comments) 06/03/2014  . Nsaids Other (See Comments) 08/21/2014  . Oxycodone-acetaminophen Nausea And Vomiting 06/03/2014  . Periguard [dimethicone] Nausea And Vomiting 06/03/2014  . Phenothiazines Nausea And Vomiting 06/03/2014  . Pioglitazone Nausea And Vomiting 06/03/2014  . Quinidine Nausea And Vomiting 06/03/2014  . Quinolones Nausea And Vomiting 06/03/2014  . Rumex crispus Other (See Comments) 08/21/2014  . Tetracyclines & related Nausea And Vomiting  06/03/2014  . Toradol [ketorolac tromethamine] Nausea And Vomiting 06/03/2014  . Tramadol Nausea And Vomiting 06/03/2014  . Tussin [guaifenesin] Nausea And Vomiting 06/03/2014  . Tussionex pennkinetic er [hydrocod polst-cpm polst er] Nausea And Vomiting 06/03/2014  . Buprenorphine hcl Nausea And Vomiting 08/21/2014  . Lincomycin hcl Nausea And Vomiting 08/21/2014  . Oxycodone-acetaminophen Hives and Nausea And Vomiting 07/17/2015  . Phenylalanine Nausea And Vomiting 08/21/2014    Review of Systems:    All systems reviewed and negative except where noted in HPI.   Physical Exam:  There were no vitals taken for this visit. No LMP recorded. Patient has had a hysterectomy. Psych:  Alert and cooperative. Normal mood and affect. General:   Alert,  Well-developed, well-nourished, pleasant and cooperative in NAD Head:  Normocephalic and atraumatic. Eyes:  Sclera clear, no icterus.   Conjunctiva pink. Ears:  Normal auditory acuity. Nose:  No deformity, discharge, or lesions. Mouth:  No deformity or lesions,oropharynx pink & moist. Neck:  Supple; no masses or  thyromegaly. Abdomen:  Normal bowel sounds.  No bruits.  Soft, non-tender and non-distended without masses, hepatosplenomegaly or hernias noted.  No guarding or rebound tenderness.    Msk:  Symmetrical without gross deformities. Good, equal movement & strength bilaterally. Pulses:  Normal pulses noted. Extremities:  No clubbing or edema.  No cyanosis. Neurologic:  Alert and oriented x3;  grossly normal neurologically. Skin:  Intact without significant lesions or rashes. No jaundice. Lymph Nodes:  No significant cervical adenopathy. Psych:  Alert and cooperative. Normal mood and affect.   Labs: CBC    Component Value Date/Time   WBC 10.2 01/27/2018 1309   RBC 6.00 (H) 01/27/2018 1309   HGB 15.8 (H) 01/27/2018 1309   HGB 13.3 01/19/2018 1632   HCT 48.1 (H) 01/27/2018 1309   HCT 41.3 01/19/2018 1632   PLT 236 01/27/2018 1309   PLT 201 01/19/2018 1632   MCV 80.2 01/27/2018 1309   MCV 79 01/19/2018 1632   MCV 80 10/02/2013 1132   MCH 26.3 01/27/2018 1309   MCHC 32.8 01/27/2018 1309   RDW 13.9 01/27/2018 1309   RDW 13.5 01/19/2018 1632   RDW 15.1 (H) 10/02/2013 1132   LYMPHSABS 1.6 01/23/2018 1259   LYMPHSABS 1.4 05/23/2015 0944   LYMPHSABS 1.0 10/02/2013 1132   MONOABS 0.8 01/23/2018 1259   MONOABS 0.5 10/02/2013 1132   EOSABS 0.1 01/23/2018 1259   EOSABS 0.1 05/23/2015 0944   EOSABS 0.1 10/02/2013 1132   BASOSABS 0.0 01/23/2018 1259   BASOSABS 0.0 05/23/2015 0944   BASOSABS 0.0 10/02/2013 1132   CMP     Component Value Date/Time   NA 137 01/27/2018 1309   NA 143 01/19/2018 1632   NA 142 10/02/2013 1132   K 4.1 01/27/2018 1309   K 4.3 10/02/2013 1132   CL 101 01/27/2018 1309   CL 110 (H) 10/02/2013 1132   CO2 25 01/27/2018 1309   CO2 27 10/02/2013 1132   GLUCOSE 99 01/27/2018 1309   GLUCOSE 81 10/02/2013 1132   BUN 31 (H) 01/27/2018 1309   BUN 10 01/19/2018 1632   BUN 7 10/02/2013 1132   CREATININE 1.00 01/27/2018 1309   CREATININE 0.85 10/02/2013 1132    CALCIUM 10.5 (H) 01/27/2018 1309   CALCIUM 9.0 10/02/2013 1132   PROT 8.5 (H) 01/27/2018 1309   PROT 6.7 01/19/2018 1632   PROT 6.1 (L) 10/02/2013 1132   ALBUMIN 5.0 01/27/2018 1309   ALBUMIN 4.5 01/19/2018 1632   ALBUMIN 3.6 10/02/2013  1132   AST 19 01/27/2018 1309   AST 16 10/02/2013 1132   ALT 11 01/27/2018 1309   ALT 27 10/02/2013 1132   ALKPHOS 68 01/27/2018 1309   ALKPHOS 59 10/02/2013 1132   BILITOT 2.0 (H) 01/27/2018 1309   BILITOT 0.9 01/19/2018 1632   BILITOT 1.0 10/02/2013 1132   GFRNONAA >60 01/27/2018 1309   GFRNONAA >60 10/02/2013 1132   GFRAA >60 01/27/2018 1309   GFRAA >60 10/02/2013 1132    Imaging Studies: Dg Chest 2 View  Result Date: 01/27/2018 CLINICAL DATA:  Vomiting for 3 days. EXAM: CHEST - 2 VIEW COMPARISON:  May 09, 2017 FINDINGS: Mild opacity/increase lung markings in the bases, similar since April 2019 but increased since 2018. No nodules or masses. No pneumothorax. The cardiomediastinal silhouette is unremarkable. No other acute abnormalities. IMPRESSION: Mild opacity/increase lung markings in the lung bases similar since April 2019 but worsened compared to 2018 studies. The findings could represent bronchitic change or developing subtle infiltrates. Recommend clinical correlation and follow-up to resolution. Electronically Signed   By: Dorise Bullion III M.D   On: 01/27/2018 13:54   Ct Abdomen Pelvis W Contrast  Result Date: 01/27/2018 CLINICAL DATA:  Nausea, vomiting, abdominal pain EXAM: CT ABDOMEN AND PELVIS WITH CONTRAST TECHNIQUE: Multidetector CT imaging of the abdomen and pelvis was performed using the standard protocol following bolus administration of intravenous contrast. CONTRAST:  141mL ISOVUE-300 IOPAMIDOL (ISOVUE-300) INJECTION 61% COMPARISON:  01/20/2018 FINDINGS: Lower chest: Lung bases are clear. No effusions. Heart is normal size. Hepatobiliary: Liver currently appears normal density. Probable small enhancing hemangioma in the right  hepatic lobe. No suspicious focal hepatic abnormality or biliary ductal dilatation. Gallbladder unremarkable. Pancreas: No focal abnormality or ductal dilatation. Spleen: No focal abnormality.  Normal size. Adrenals/Urinary Tract: No adrenal abnormality. No focal renal abnormality. No stones or hydronephrosis. Urinary bladder is unremarkable. Stomach/Bowel: Stomach, large and small bowel grossly unremarkable. Normal appendix. Vascular/Lymphatic: No evidence of aneurysm or adenopathy. Reproductive: Prior hysterectomy.  No adnexal masses. Other: No free fluid or free air. Musculoskeletal: No acute bony abnormality. IMPRESSION: No acute findings in the abdomen or pelvis. Electronically Signed   By: Rolm Baptise M.D.   On: 01/27/2018 18:18   Ct Abdomen Pelvis W Contrast  Result Date: 01/20/2018 CLINICAL DATA:  Left abdominal pain radiating to the left flank for 4 days. Pain with urination. Frequency and nausea. EXAM: CT ABDOMEN AND PELVIS WITH CONTRAST TECHNIQUE: Multidetector CT imaging of the abdomen and pelvis was performed using the standard protocol following bolus administration of intravenous contrast. CONTRAST:  118mL ISOVUE-300 IOPAMIDOL (ISOVUE-300) INJECTION 61% COMPARISON:  May 25, 2016 FINDINGS: Lower chest: No acute abnormality. Hepatobiliary: Hepatic steatosis. A low-attenuation lesion in the right hepatic lobe on series 2, image 13 is unchanged since February 2018 most consistent with a hemangioma. No other liver masses. The gallbladder is normal. The portal vein is normal. Pancreas: Unremarkable. No pancreatic ductal dilatation or surrounding inflammatory changes. Spleen: Normal in size without focal abnormality. Adrenals/Urinary Tract: Adrenal glands are unremarkable. Kidneys are normal, without renal calculi, focal lesion, or hydronephrosis. Bladder is unremarkable. Stomach/Bowel: The stomach and small bowel are normal. Moderate fecal loading in the ascending and transverse colon. The colon is  otherwise normal in appearance. The appendix is normal. Vascular/Lymphatic: No significant vascular findings are present. No enlarged abdominal or pelvic lymph nodes. Reproductive: Status post hysterectomy. No adnexal masses. Other: No abdominal wall hernia or abnormality. No abdominopelvic ascites. Musculoskeletal: No acute or significant osseous findings. IMPRESSION:  1. No cause for the patient's left flank pain identified. No acute abnormalities. 2. Hepatic steatosis.  Small hemangioma in the right hepatic lobe. 3. Moderate fecal loading in the ascending and transverse colon. Electronically Signed   By: Dorise Bullion III M.D   On: 01/20/2018 17:50   Dg Abd 2 Views  Result Date: 01/23/2018 CLINICAL DATA:  No bowel movement for 4 days EXAM: ABDOMEN - 2 VIEW COMPARISON:  CT 01/20/2018, radiograph 01/19/2018 FINDINGS: Lung bases are clear. No free air beneath the diaphragm. Nonobstructed bowel gas pattern with large volume of stool throughout the colon. IMPRESSION: Nonobstructed gas pattern with large volume of stool in the colon. Electronically Signed   By: Donavan Foil M.D.   On: 01/23/2018 14:25   Dg Abd 2 Views  Result Date: 01/20/2018 CLINICAL DATA:  Stabbing pain across entire abdomen with painful and frequent urination for 3-4 days, history of irritable bowel syndrome EXAM: ABDOMEN - 2 VIEW COMPARISON:  CT abdomen and pelvis 05/25/2017 FINDINGS: Normal bowel gas pattern. No bowel dilatation, bowel wall thickening, or free air. Bones demineralized. No urinary tract calcifications. IMPRESSION: No acute abnormalities. Electronically Signed   By: Lavonia Dana M.D.   On: 01/20/2018 09:03   Dg Foot Complete Left  Result Date: 01/10/2018 Please see detailed radiograph report in office note.  Dg Foot Complete Right  Result Date: 01/10/2018 Please see detailed radiograph report in office note.   Assessment and Plan:   Erin Richards is a 53 y.o. y/o female has been referred by her primary care  provider, referral is for diarrhea and abdominal pain, but patient reporting constipation  Patient reports abdominal pain due to constipation, with relief of abdominal pain with relief of constipation, with recent ER visits for the same consistent with her history  Patient does not want to try MiraLAX and states Colace is working well and would like to stay on it.  She is otherwise asymptomatic at this time.  No diarrhea or constipation with Colace every other day.  If she develops the symptoms I have asked her to call us and she verbalized understanding.  I have reviewed her previous GI records including images from her colonoscopy and previous GI notes. Her colonoscopy images from July 2019 that showed normal mucosa consistent with her biopsies not showing any colitis.  I have messaged Dr. Reuel Derby and requested that they compare 2016 biopsies to the 2019 biopsies as it is unclear why 2016 biopsies would show chronic colitis.  Otherwise patient does not have any symptoms of IBD and colonoscopy findings with biopsies are not consistent with IBD.  Patient had an EGD with Dr. Vira Agar in July 2016 and the images of this are not available in epic or provation itself.  However, procedure report states that findings suspicious for short segment Barrett's was found at the time.  Biopsies from this procedure report did not report Barrett's.  However, if salmon-colored mucosa is present and biopsies do not show Barrett's, it may be due to sampling error.  We discussed this with the patient and discussed that no dysplasia was seen on her previous Barrett's biopsies, but it is unclear if she had real salmon-colored mucosa during those biopsies as I am unable to find any images from the procedure.  Barrett's without dysplasia is recommended to have surveillance every 3 years after discussion with the patient.  We discussed the options of repeat EGD to see if Barrett's is present and biopsy at to rule out dysplasia,  or no further answer EGDs given low risk of malignancy.  Patient would like to proceed with EGD to evaluate for Barrett's.  She does not have any heartburn.  I have discontinued her Zantac as she does not have any abdominal pain or acid reflux.  Depending on her EGD findings we can discontinue her PPI as well  I have discussed alternative options, risks & benefits,  which include, but are not limited to, bleeding, infection, perforation,respiratory complication & drug reaction.  The patient agrees with this plan & written consent will be obtained.    Finding of fatty liver on imaging discussed with patient Diet, weight loss, and exercise encouraged along with avoiding hepatotoxic drugs including alcohol Risk of progression to cirrhosis if above measures are not instituted were discussed as well, and patient verbalized understanding  Last lab work showed elevated bilirubin.  Will repeat along with hepatitis a A, B, C serology and direct bilirubin.  If she is not immune to hepatitis A or B I have discussed that she should get vaccinated given her finding of fatty liver   Dr Erin Richards  Speech recognition software was used to dictate the above note.

## 2018-02-08 NOTE — Patient Instructions (Signed)
Fatty Liver Disease  Fatty liver disease occurs when too much fat has built up in your liver cells. Fatty liver disease is also called hepatic steatosis or steatohepatitis. The liver removes harmful substances from your bloodstream and produces fluids that your body needs. It also helps your body use and store energy from the food you eat. In many cases, fatty liver disease does not cause symptoms or problems. It is often diagnosed when tests are being done for other reasons. However, over time, fatty liver can cause inflammation that may lead to more serious liver problems, such as scarring of the liver (cirrhosis) and liver failure. Fatty liver is associated with insulin resistance, increased body fat, high blood pressure (hypertension), and high cholesterol. These are features of metabolic syndrome and increase your risk for stroke, diabetes, and heart disease. What are the causes? This condition may be caused by:  Drinking too much alcohol.  Poor nutrition.  Obesity.  Cushing's syndrome.  Diabetes.  High cholesterol.  Certain drugs.  Poisons.  Some viral infections.  Pregnancy. What increases the risk? You are more likely to develop this condition if you:  Abuse alcohol.  Are overweight.  Have diabetes.  Have hepatitis.  Have a high triglyceride level.  Are pregnant. What are the signs or symptoms? Fatty liver disease often does not cause symptoms. If symptoms do develop, they can include:  Fatigue.  Weakness.  Weight loss.  Confusion.  Abdominal pain.  Nausea and vomiting.  Yellowing of your skin and the white parts of your eyes (jaundice).  Itchy skin. How is this diagnosed? This condition may be diagnosed by:  A physical exam and medical history.  Blood tests.  Imaging tests, such as an ultrasound, CT scan, or MRI.  A liver biopsy. A small sample of liver tissue is removed using a needle. The sample is then looked at under a microscope. How  is this treated? Fatty liver disease is often caused by other health conditions. Treatment for fatty liver may involve medicines and lifestyle changes to manage conditions such as:  Alcoholism.  High cholesterol.  Diabetes.  Being overweight or obese. Follow these instructions at home:   Do not drink alcohol. If you have trouble quitting, ask your health care provider how to safely quit with the help of medicine or a supervised program. This is important to keep your condition from getting worse.  Eat a healthy diet as told by your health care provider. Ask your health care provider about working with a diet and nutrition specialist (dietitian) to develop an eating plan.  Exercise regularly. This can help you lose weight and control your cholesterol and diabetes. Talk to your health care provider about an exercise plan and which activities are best for you.  Take over-the-counter and prescription medicines only as told by your health care provider.  Keep all follow-up visits as told by your health care provider. This is important. Contact a health care provider if: You have trouble controlling your:  Blood sugar. This is especially important if you have diabetes.  Cholesterol.  Drinking of alcohol. Get help right away if:  You have abdominal pain.  You have jaundice.  You have nausea and vomiting.  You vomit blood or material that looks like coffee grounds.  You have stools that are black, tar-like, or bloody. Summary  Fatty liver disease develops when too much fat builds up in the cells of your liver.  Fatty liver disease often causes no symptoms or problems. However, over   time, fatty liver can cause inflammation that may lead to more serious liver problems, such as scarring of the liver (cirrhosis).  You are more likely to develop this condition if you abuse alcohol, are pregnant, are overweight, have diabetes, have hepatitis, or have high triglyceride  levels.  Contact your health care provider if you have trouble controlling your weight, blood sugar, cholesterol, or drinking of alcohol. This information is not intended to replace advice given to you by your health care provider. Make sure you discuss any questions you have with your health care provider. Document Released: 03/06/2005 Document Revised: 10/28/2016 Document Reviewed: 10/28/2016 Elsevier Interactive Patient Education  2019 Elsevier Inc.  

## 2018-02-09 LAB — COMPREHENSIVE METABOLIC PANEL
ALK PHOS: 80 IU/L (ref 39–117)
ALT: 7 IU/L (ref 0–32)
AST: 14 IU/L (ref 0–40)
Albumin/Globulin Ratio: 1.7 (ref 1.2–2.2)
Albumin: 4.3 g/dL (ref 3.5–5.5)
BUN/Creatinine Ratio: 10 (ref 9–23)
BUN: 7 mg/dL (ref 6–24)
Bilirubin Total: 0.6 mg/dL (ref 0.0–1.2)
CO2: 26 mmol/L (ref 20–29)
Calcium: 9.2 mg/dL (ref 8.7–10.2)
Chloride: 102 mmol/L (ref 96–106)
Creatinine, Ser: 0.7 mg/dL (ref 0.57–1.00)
GFR calc Af Amer: 117 mL/min/{1.73_m2} (ref >59)
GFR calc non Af Amer: 101 mL/min/{1.73_m2} (ref >59)
Globulin, Total: 2.6 g/dL (ref 1.5–4.5)
Glucose: 77 mg/dL (ref 65–99)
Potassium: 4.3 mmol/L (ref 3.5–5.2)
Sodium: 143 mmol/L (ref 134–144)
Total Protein: 6.9 g/dL (ref 6.0–8.5)

## 2018-02-09 LAB — HEPATITIS B SURFACE ANTIGEN: Hepatitis B Surface Ag: NEGATIVE

## 2018-02-09 LAB — BILIRUBIN, DIRECT: Bilirubin, Direct: 0.17 mg/dL (ref 0.00–0.40)

## 2018-02-09 LAB — HCV COMMENT:

## 2018-02-09 LAB — HEPATITIS B SURFACE ANTIBODY,QUALITATIVE: Hep B Surface Ab, Qual: NONREACTIVE

## 2018-02-09 LAB — HEPATITIS A ANTIBODY, TOTAL: Hep A Total Ab: NEGATIVE

## 2018-02-09 LAB — HEPATITIS C ANTIBODY (REFLEX): HCV Ab: <0.1 s/co ratio (ref 0.0–0.9)

## 2018-02-09 LAB — HEPATITIS B CORE ANTIBODY, TOTAL: Hep B Core Total Ab: NEGATIVE

## 2018-02-14 LAB — SURGICAL PATHOLOGY

## 2018-02-17 IMAGING — CT CT ABD-PELV W/ CM
2 of 5 series · 16 of 46 positions shown, 18 images · IV contrast (APPLIED)
Comparison: CT the abdomen and pelvis 03/16/2016.

CLINICAL DATA: 51-year-old female with history of vomiting since
yesterday. Frequent urination with pressure and pain when she
urinates. Pain in the lower left side radiating into the back.

EXAM:
CT ABDOMEN AND PELVIS WITH CONTRAST
TECHNIQUE: Multidetector CT imaging of the abdomen and pelvis was performed
using the standard protocol following bolus administration of
intravenous contrast.
CONTRAST:  100mL WXWI97-3PP IOPAMIDOL (WXWI97-3PP) INJECTION 61%

[Series 2: routine abd/pel with · axial · 0.77mm/px · z∈[-448,-108]mm · 13 of 78 slices shown, 15 images]
[im 5/78  soft-tissue]
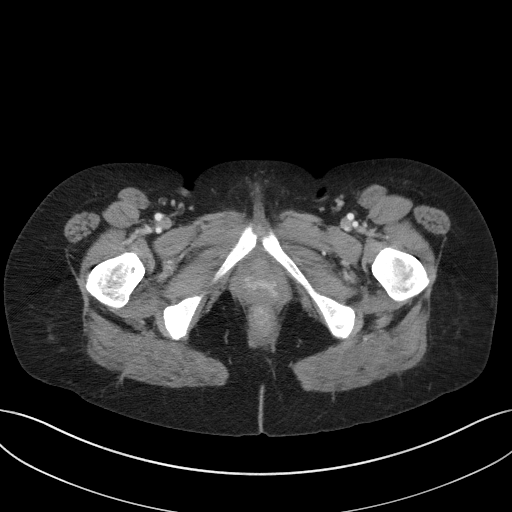
[im 5/78  bone]
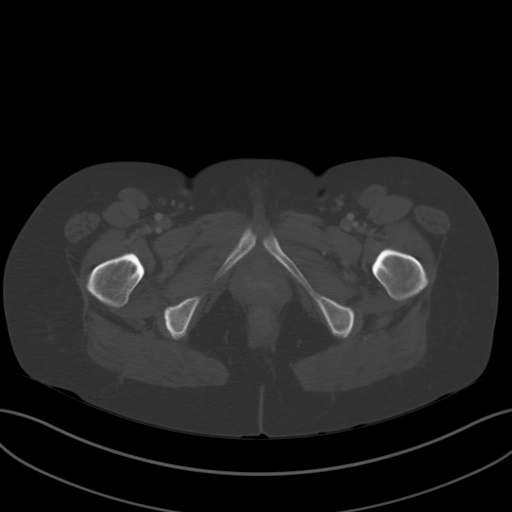
[im 13/78  soft-tissue]
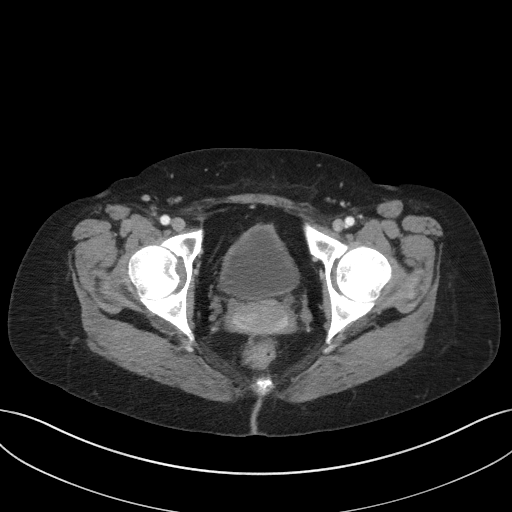
[im 17/78  soft-tissue]
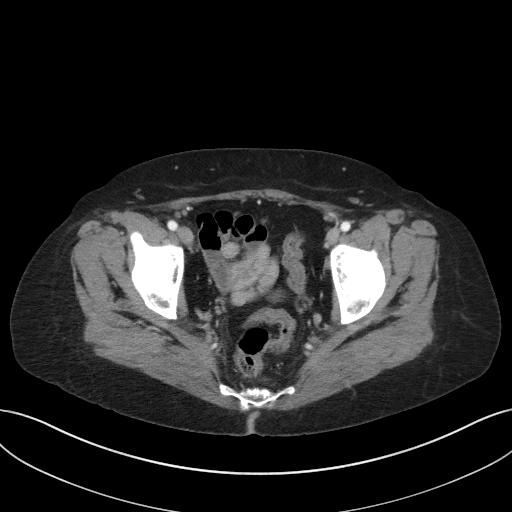
[im 21/78  soft-tissue]
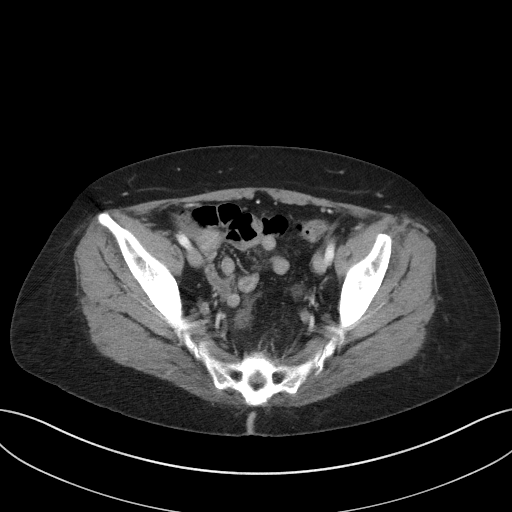
[im 29/78  soft-tissue]
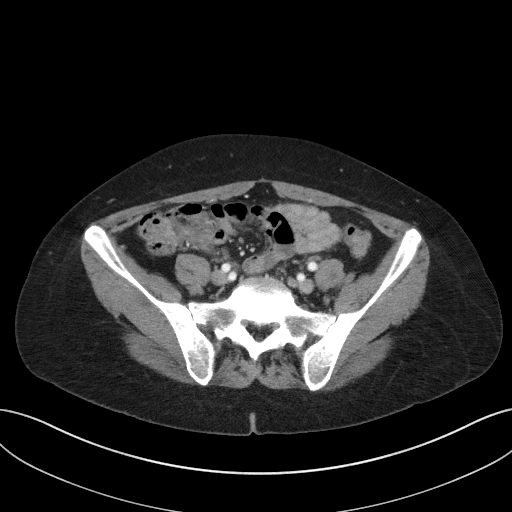
[im 33/78  soft-tissue]
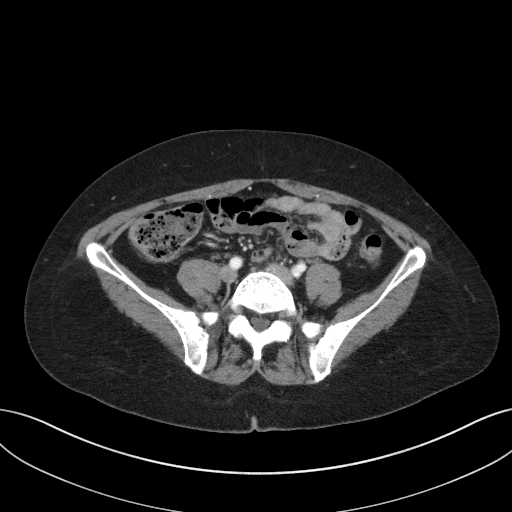
[im 41/78  soft-tissue]
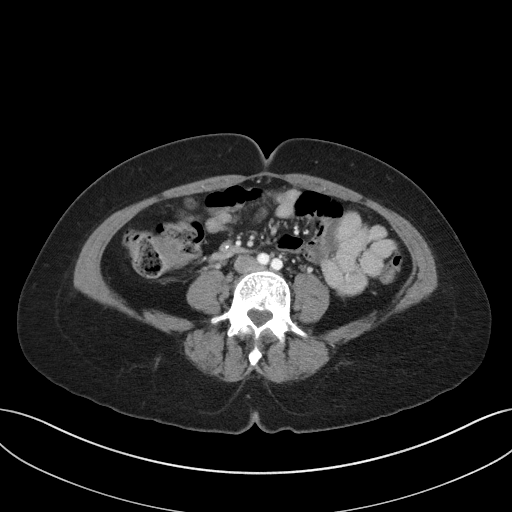
[im 45/78  soft-tissue]
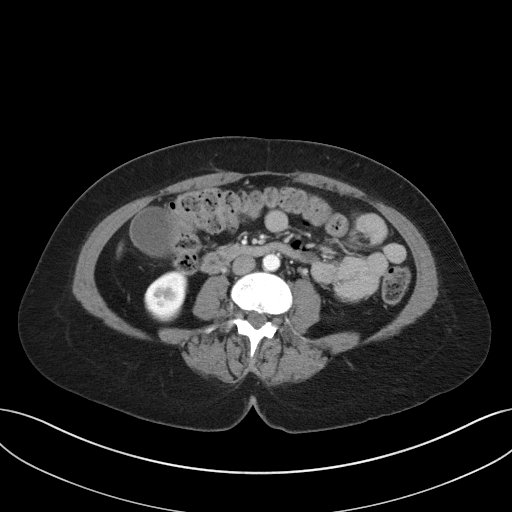
[im 49/78  soft-tissue]
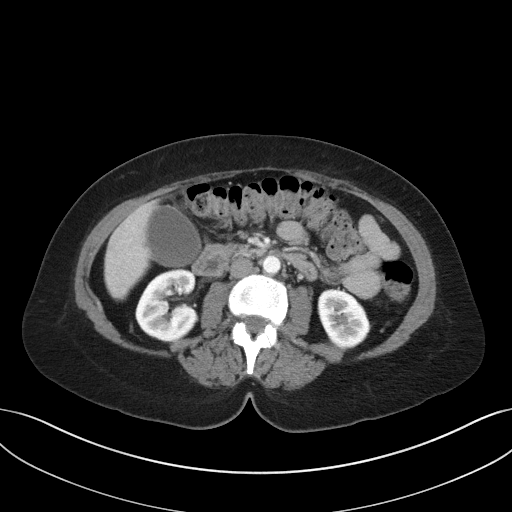
[im 49/78  bone]
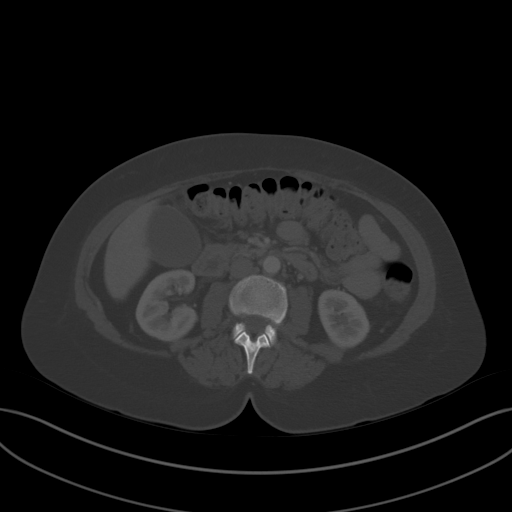
[im 57/78  soft-tissue]
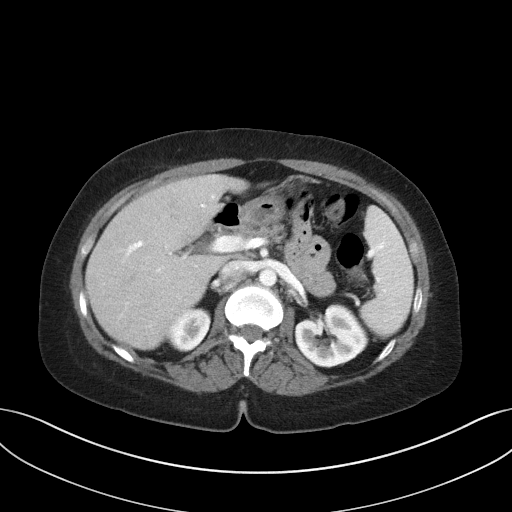
[im 61/78  soft-tissue]
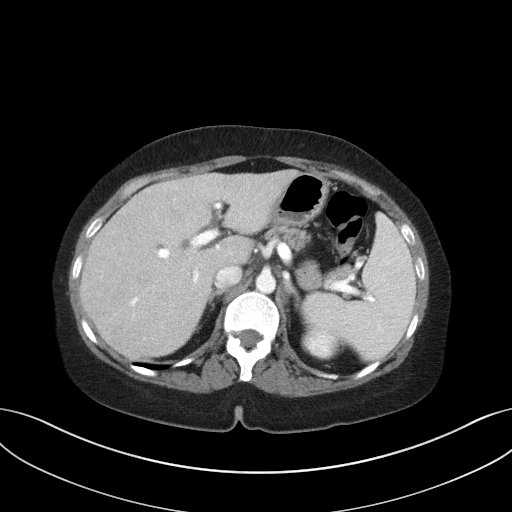
[im 65/78  soft-tissue]
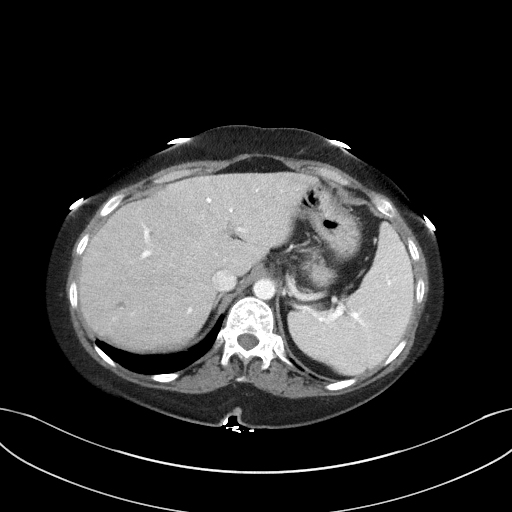
[im 73/78  soft-tissue]
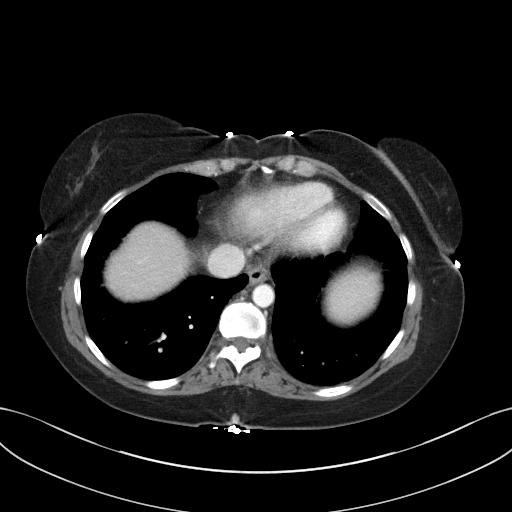

[Series 5: coronal st · coronal · 0.68mm/px · 3 of 76 slices shown]
[im 26/76  soft-tissue]
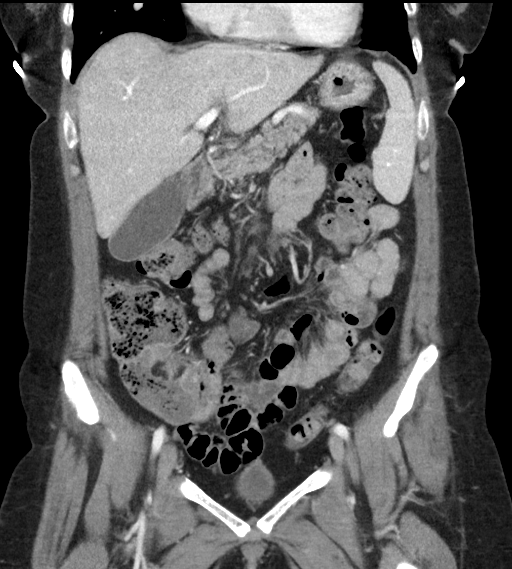
[im 34/76  soft-tissue]
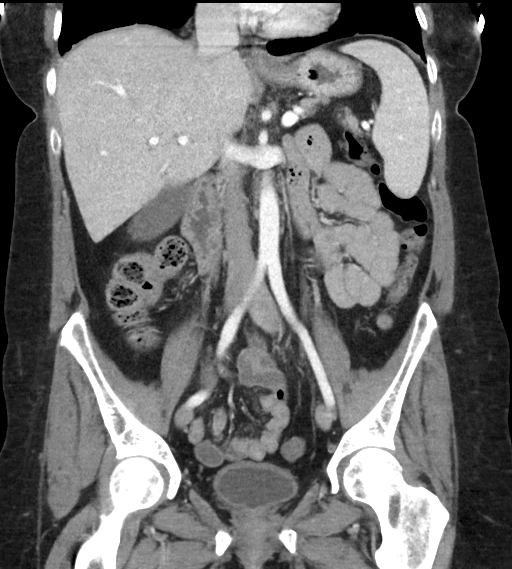
[im 42/76  soft-tissue]
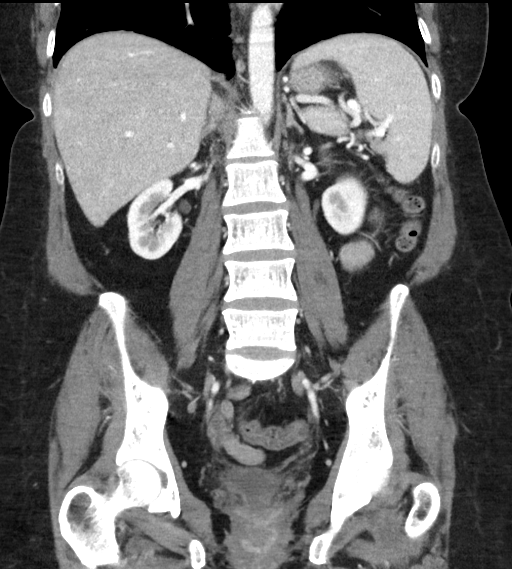

[16 of 46 positions shown; findings below may reference images not displayed]

FINDINGS: Lower chest: Unremarkable.

Hepatobiliary: Subcentimeter lesion in segment 7 which demonstrates
peripheral nodular enhancement and progressive centripetal filling,
compatible with a small cavernous hemangioma, similar to the prior
study. No suspicious hepatic lesions. No intra or extrahepatic
biliary ductal dilatation. Gallbladder is moderately distended, but
otherwise unremarkable in appearance. Specifically, no gallstones
and no prior cholecystic fluid or inflammatory changes.

Pancreas: No pancreatic mass. No pancreatic ductal dilatation. No
pancreatic or peripancreatic fluid or inflammatory changes.

Spleen: Unremarkable.

Adrenals/Urinary Tract: Bilateral kidneys and bilateral adrenal
glands are normal in appearance. No hydroureteronephrosis. Urinary
bladder is normal in appearance.

Stomach/Bowel: The appearance of the stomach is normal. There is no
pathologic dilatation of small bowel or colon. Normal appendix.

Vascular/Lymphatic: No significant atherosclerotic disease, aneurysm
or dissection noted in the abdominal or pelvic vasculature.

Reproductive: Status posthysterectomy. Ovaries are not confidently
identified may be surgically absent or atrophic.

Other: No significant volume of ascites.  No pneumoperitoneum.

Musculoskeletal: There are no aggressive appearing lytic or blastic
lesions noted in the visualized portions of the skeleton.
IMPRESSION: 1. No acute findings are noted in the abdomen or pelvis to account
for the patient's symptoms.
2. Normal appendix.
3. Additional incidental findings, as above.

## 2018-02-18 ENCOUNTER — Telehealth: Payer: Self-pay

## 2018-02-18 NOTE — Telephone Encounter (Signed)
-----   Message from Virgel Manifold, MD sent at 02/09/2018  8:08 AM EST ----- Erin Richards please let patient know, her labwork was normal. She is not immune to Hep A or B and should get vaccination for this due to her history of fatty liver. She can get this at our facility or PCP whichever she prefers.

## 2018-02-18 NOTE — Telephone Encounter (Signed)
Pt notified. Encouraged to get immunized for Hepatitis A/B from her PCP or [facility]. Pt also requested her appt for her procedure on 03/02/2018 due to her son cannot bring her. Will send pt a recall letter for 1 month out to rescheduled. Did not want to reschedule at this time. [facility] notified.

## 2018-03-02 ENCOUNTER — Ambulatory Visit: Admit: 2018-03-02 | Payer: Medicaid Other | Admitting: Gastroenterology

## 2018-03-02 SURGERY — ESOPHAGOGASTRODUODENOSCOPY (EGD) WITH PROPOFOL
Anesthesia: General

## 2018-05-10 ENCOUNTER — Telehealth: Payer: Self-pay

## 2018-05-10 NOTE — Telephone Encounter (Signed)
I called and spoke with patient to get more information. Patient is requesting that Dr. Caryn Section send in an order to [facility] for a portable oxygen tank. Patient says she has had the big oxygen concentrator tank supplied through [facility] for several years. She says she was started on oxygen therapy several years ago (possibly 2014) when she was in the hospital, due to her oxygen dropping too low. Patient says that [facility] is trying to phase out the big oxygen concentrator tanks to the portable tanks. Patient wants this done asap. I advised patient that this may require an office visit to determine if oxygen therapy is still medically necessary. She says her 4 doesn't always drop when she moves around. She says it seems to drop with high levels of pollen outside.  Please advise.

## 2018-05-10 NOTE — Telephone Encounter (Signed)
Patient called stating she would like a rx send in to [facility] for a portable oxygen tank if possible. She is requesting a call back from Dr. Caryn Section nurse.

## 2018-05-11 NOTE — Telephone Encounter (Signed)
I called [facility] and they do not have record of her as a patient.   I spoke with pt she states she was put on O2 when ambulating the last time she was "in the hospital"  I advised pt she will have to come in to determine if oxygen therapy is still medically necessary for ambulating.  She states she will call and make an appointment when COVID-19 outbreak improves.  Thanks,   -Mickel Baas

## 2018-05-11 NOTE — Telephone Encounter (Signed)
per our records from [facility] 04/02/2016 under [facility], she had overnight oximetry in february 2017 qualifying her for nocturnal oxygen. Portable oxygen tanks are only covered if requires oxygen when ambulating we have no records of that.  Can you call [facility] and see if they have any records of her qualifying for portable oxygen?

## 2018-05-12 ENCOUNTER — Ambulatory Visit: Payer: Medicaid Other | Admitting: Family Medicine

## 2018-06-28 ENCOUNTER — Other Ambulatory Visit: Payer: Self-pay | Admitting: Family Medicine

## 2018-06-28 DIAGNOSIS — J302 Other seasonal allergic rhinitis: Secondary | ICD-10-CM

## 2018-06-28 MED ORDER — CETIRIZINE HCL 10 MG PO TABS: 10.0000 mg | ORAL_TABLET | Freq: Every day | ORAL | 11 refills | Status: DC

## 2018-06-28 NOTE — Telephone Encounter (Signed)
[facility] faxed refill request for the following medications:  cetirizine (ZYRTEC) 10 MG tablet  Please advise. Thanks TNP

## 2018-06-29 ENCOUNTER — Encounter: Payer: Self-pay | Admitting: Family Medicine

## 2018-07-01 ENCOUNTER — Telehealth: Payer: Self-pay

## 2018-07-01 DIAGNOSIS — Z1231 Encounter for screening mammogram for malignant neoplasm of breast: Secondary | ICD-10-CM

## 2018-07-01 NOTE — Telephone Encounter (Signed)
That's fine

## 2018-07-01 NOTE — Telephone Encounter (Signed)
Patient called wanting an order placed for her mammogram. [facility]. Ok to order?

## 2018-07-05 ENCOUNTER — Telehealth: Payer: Self-pay | Admitting: Family Medicine

## 2018-07-05 DIAGNOSIS — G4734 Idiopathic sleep related nonobstructive alveolar hypoventilation: Secondary | ICD-10-CM

## 2018-07-05 NOTE — Telephone Encounter (Signed)
Have you received results?  Thanks,   -Mickel Baas

## 2018-07-05 NOTE — Telephone Encounter (Signed)
There is a order in media from 05-27-2018, but I haven't seen results.

## 2018-07-05 NOTE — Telephone Encounter (Signed)
Order has been placed.    Thanks,   -Mickel Baas

## 2018-07-05 NOTE — Telephone Encounter (Signed)
Pt called saying that the nurse from [facility] came and put a "thing" on her finger for over night to watch her HR and to see what her oxygen was.  She has not heard anything back from [place] or [facility]  Please advise  CB#  [phone]  [place]

## 2018-07-06 NOTE — Telephone Encounter (Signed)
Spoke with a representative from [facility].  She is going to fax the results today.   Thanks,   -Mickel Baas

## 2018-07-11 NOTE — Telephone Encounter (Signed)
Erin Richards from [facility] is going to fax the results again.  She states the DOB on the results page is wrong.  But she verified the results are for Erin Richards DOB Jul 27, 1965   Thanks,   -Mickel Baas

## 2018-07-11 NOTE — Telephone Encounter (Signed)
I still have not received results of overnight oximetry

## 2018-07-13 ENCOUNTER — Other Ambulatory Visit: Payer: Self-pay

## 2018-07-13 MED ORDER — MELOXICAM 15 MG PO TABS: 15.0000 mg | ORAL_TABLET | Freq: Every day | ORAL | 3 refills | Status: DC

## 2018-07-13 NOTE — Telephone Encounter (Signed)
I'm not sure if this would be covered. Can you check with [facility]. She qualifies for nocturnal oxygen, but wants a portable oxygen tank to take with her when she travels. Do they have anything like that that would be covered by Medicare?

## 2018-07-13 NOTE — Telephone Encounter (Signed)
Pt advised.  She states since her husband past away should would like to travel more with her children.  She is wanting to know if there is a way to get portable oxygen for when she goes out of town.   Please advise.    Thanks,   -Mickel Baas

## 2018-07-13 NOTE — Telephone Encounter (Signed)
Pharmacy refill request for Meloxicam 15mg    Per Dr. Milinda Pointer, ok to refill.   Script has been sent to pharmacy

## 2018-07-13 NOTE — Telephone Encounter (Signed)
Overnight oximetry shows her oxygen levels drop to 81% a night, should stay above 90%. She does qualify for and needs to use nighttime oxygen.  This does not qualify her for portable oxygen. She has to have an ambulatory oxygen measurement below 89% for portable oxygen.

## 2018-07-14 ENCOUNTER — Telehealth: Payer: Self-pay

## 2018-07-14 ENCOUNTER — Encounter: Payer: Self-pay | Admitting: Physician Assistant

## 2018-07-14 ENCOUNTER — Ambulatory Visit (INDEPENDENT_AMBULATORY_CARE_PROVIDER_SITE_OTHER): Payer: Medicaid Other | Admitting: Physician Assistant

## 2018-07-14 VITALS — Temp 98.6°F | Wt 132.0 lb

## 2018-07-14 DIAGNOSIS — J0140 Acute pansinusitis, unspecified: Secondary | ICD-10-CM | POA: Diagnosis not present

## 2018-07-14 MED ORDER — PREDNISONE 20 MG PO TABS: 20.0000 mg | ORAL_TABLET | Freq: Every day | ORAL | 0 refills | Status: DC

## 2018-07-14 MED ORDER — AZITHROMYCIN 250 MG PO TABS: ORAL_TABLET | ORAL | 1 refills | Status: DC

## 2018-07-14 MED ORDER — AZITHROMYCIN 250 MG PO TABS: ORAL_TABLET | ORAL | 0 refills | Status: DC

## 2018-07-14 NOTE — Telephone Encounter (Signed)
There is no message. It is just blank. Not sure what patient called about

## 2018-07-14 NOTE — Patient Instructions (Signed)
Sinusitis, Adult  Sinusitis is inflammation of your sinuses. Sinuses are hollow spaces in the bones around your face. Your sinuses are located:   Around your eyes.   In the middle of your forehead.   Behind your nose.   In your cheekbones.  Mucus normally drains out of your sinuses. When your nasal tissues become inflamed or swollen, mucus can become trapped or blocked. This allows bacteria, viruses, and fungi to grow, which leads to infection. Most infections of the sinuses are caused by a virus.  Sinusitis can develop quickly. It can last for up to 4 weeks (acute) or for more than 12 weeks (chronic). Sinusitis often develops after a cold.  What are the causes?  This condition is caused by anything that creates swelling in the sinuses or stops mucus from draining. This includes:   Allergies.   Asthma.   Infection from bacteria or viruses.   Deformities or blockages in your nose or sinuses.   Abnormal growths in the nose (nasal polyps).   Pollutants, such as chemicals or irritants in the air.   Infection from fungi (rare).  What increases the risk?  You are more likely to develop this condition if you:   Have a weak body defense system (immune system).   Do a lot of swimming or diving.   Overuse nasal sprays.   Smoke.  What are the signs or symptoms?  The main symptoms of this condition are pain and a feeling of pressure around the affected sinuses. Other symptoms include:   Stuffy nose or congestion.   Thick drainage from your nose.   Swelling and warmth over the affected sinuses.   Headache.   Upper toothache.   A cough that may get worse at night.   Extra mucus that collects in the throat or the back of the nose (postnasal drip).   Decreased sense of smell and taste.   Fatigue.   A fever.   Sore throat.   Bad breath.  How is this diagnosed?  This condition is diagnosed based on:   Your symptoms.   Your medical history.   A physical exam.   Tests to find out if your condition is  acute or chronic. This may include:  ? Checking your nose for nasal polyps.  ? Viewing your sinuses using a device that has a light (endoscope).  ? Testing for allergies or bacteria.  ? Imaging tests, such as an MRI or CT scan.  In rare cases, a bone biopsy may be done to rule out more serious types of fungal sinus disease.  How is this treated?  Treatment for sinusitis depends on the cause and whether your condition is chronic or acute.   If caused by a virus, your symptoms should go away on their own within 10 days. You may be given medicines to relieve symptoms. They include:  ? Medicines that shrink swollen nasal passages (topical intranasal decongestants).  ? Medicines that treat allergies (antihistamines).  ? A spray that eases inflammation of the nostrils (topical intranasal corticosteroids).  ? Rinses that help get rid of thick mucus in your nose (nasal saline washes).   If caused by bacteria, your health care provider may recommend waiting to see if your symptoms improve. Most bacterial infections will get better without antibiotic medicine. You may be given antibiotics if you have:  ? A severe infection.  ? A weak immune system.   If caused by narrow nasal passages or nasal polyps, you may need   to have surgery.  Follow these instructions at home:  Medicines   Take, use, or apply over-the-counter and prescription medicines only as told by your health care provider. These may include nasal sprays.   If you were prescribed an antibiotic medicine, take it as told by your health care provider. Do not stop taking the antibiotic even if you start to feel better.  Hydrate and humidify     Drink enough fluid to keep your urine pale yellow. Staying hydrated will help to thin your mucus.   Use a cool mist humidifier to keep the humidity level in your home above 50%.   Inhale steam for 10-15 minutes, 3-4 times a day, or as told by your health care provider. You can do this in the bathroom while a hot shower is  running.   Limit your exposure to cool or dry air.  Rest   Rest as much as possible.   Sleep with your head raised (elevated).   Make sure you get enough sleep each night.  General instructions     Apply a warm, moist washcloth to your face 3-4 times a day or as told by your health care provider. This will help with discomfort.   Wash your hands often with soap and water to reduce your exposure to germs. If soap and water are not available, use hand sanitizer.   Do not smoke. Avoid being around people who are smoking (secondhand smoke).   Keep all follow-up visits as told by your health care provider. This is important.  Contact a health care provider if:   You have a fever.   Your symptoms get worse.   Your symptoms do not improve within 10 days.  Get help right away if:   You have a severe headache.   You have persistent vomiting.   You have severe pain or swelling around your face or eyes.   You have vision problems.   You develop confusion.   Your neck is stiff.   You have trouble breathing.  Summary   Sinusitis is soreness and inflammation of your sinuses. Sinuses are hollow spaces in the bones around your face.   This condition is caused by nasal tissues that become inflamed or swollen. The swelling traps or blocks the flow of mucus. This allows bacteria, viruses, and fungi to grow, which leads to infection.   If you were prescribed an antibiotic medicine, take it as told by your health care provider. Do not stop taking the antibiotic even if you start to feel better.   Keep all follow-up visits as told by your health care provider. This is important.  This information is not intended to replace advice given to you by your health care provider. Make sure you discuss any questions you have with your health care provider.  Document Released: 01/19/2005 Document Revised: 06/21/2017 Document Reviewed: 06/21/2017  Elsevier Interactive Patient Education  2019 [employer].

## 2018-07-14 NOTE — Progress Notes (Signed)
Virtual Visit via Telephone Note  I connected with Erin Richards on 07/14/18 at  3:40 PM EDT by telephone and verified that I am speaking with the correct person using two identifiers.  Location: Patient: home Provider: [facility]   I discussed the limitations, risks, security and privacy concerns of performing an evaluation and management service by telephone and the availability of in person appointments. I also discussed with the patient that there may be a patient responsible charge related to this service. The patient expressed understanding and agreed to proceed.  Mar Daring, PA-C    Patient: Erin Richards Female    DOB: 1965-09-24   53 y.o.   MRN: [id number] Visit Date: 07/14/2018  Today's Provider: Mar Daring, PA-C   No chief complaint on file.  Subjective:    I,Erin Richards,RMA am acting as a Education administrator for [employer], PA-C.  Virtual Visit via Telephone Note  URI  This is a new problem. The current episode started in the past 7 days. There has been no fever. Associated symptoms include congestion, coughing (productive green sputum), headaches, rhinorrhea and sinus pain. Pertinent negatives include no abdominal pain, chest pain, diarrhea, ear pain, nausea, neck pain, sore throat, vomiting or wheezing. Associated symptoms comments: Body ache. She has tried antihistamine, decongestant, acetaminophen and inhaler use (heating pad, ice pack) for the symptoms. The treatment provided no relief.      Allergies  Allergen Reactions  . Acetaminophen-Codeine Nausea And Vomiting  . Antiseptic Oral Rinse  [Cetylpyridinium Chloride] Other (See Comments)    UNK  . Aspartame Other (See Comments)    Reaction: unknown  . Biaxin [Clarithromycin] Nausea And Vomiting  . Carafate [Sucralfate]     Pt says her "Stomach hurts" when she takes it.    . Chlorhexidine Gluconate Nausea And Vomiting  . Clindamycin/Lincomycin Nausea And Vomiting  . Codeine Itching and  Nausea And Vomiting  . Dextromethorphan Hbr Other (See Comments)    Reaction: unknown  . Dilaudid [Hydromorphone Hcl] Nausea And Vomiting  . Doxycycline Nausea And Vomiting  . Fentanyl Nausea And Vomiting  . Fluticasone-Salmeterol Other (See Comments)    Blister in mouth Other reaction(s): Other (See Comments) Blister in mouth  . Germanium Other (See Comments)  . Hydrocodone Nausea And Vomiting  . Ketorolac Other (See Comments)  . Levofloxacin Other (See Comments)    GI upset. GI upset  . Mefenamic Acid Nausea And Vomiting  . Metformin And Related Nausea And Vomiting  . Metronidazole Diarrhea and Nausea And Vomiting  . Morphine And Related Nausea And Vomiting  . Moxifloxacin Swelling  . Nitrofurantoin Nausea And Vomiting and Other (See Comments)  . Nsaids Other (See Comments)    Reaction: unknown  . Oxycodone-Acetaminophen Nausea And Vomiting  . Periguard [Dimethicone] Nausea And Vomiting  . Phenothiazines Nausea And Vomiting  . Pioglitazone Nausea And Vomiting  . Quinidine Nausea And Vomiting  . Quinolones Nausea And Vomiting  . Rumex Crispus Other (See Comments)  . Tetracyclines & Related Nausea And Vomiting  . Toradol [Ketorolac Tromethamine] Nausea And Vomiting  . Tramadol Nausea And Vomiting  . Tussin [Guaifenesin] Nausea And Vomiting  . Tussionex Pennkinetic Er [Hydrocod Polst-Cpm Polst Er] Nausea And Vomiting  . Buprenorphine Hcl Nausea And Vomiting  . Lincomycin Hcl Nausea And Vomiting  . Oxycodone-Acetaminophen Hives and Nausea And Vomiting    Other reaction(s): Nausea And Vomiting, Unknown  . Phenylalanine Nausea And Vomiting     Current Outpatient Medications:  .  albuterol (PROAIR HFA) 108 (90 Base) MCG/ACT inhaler, Inhale 1-2 puffs into the lungs every 6 (six) hours as needed for shortness of breath., Disp: 8.5 Inhaler, Rfl: 5 .  azelastine (ASTELIN) 0.1 % nasal spray, Place 1 spray into both nostrils 2 (two) times daily., Disp: 30 mL, Rfl: 12 .  cetirizine  (ZYRTEC) 10 MG tablet, Take 1 tablet (10 mg total) by mouth daily., Disp: 30 tablet, Rfl: 11 .  citalopram (CELEXA) 10 MG tablet, Take 1 tablet (10 mg total) by mouth daily., Disp: 30 tablet, Rfl: 12 .  COMBIVENT RESPIMAT 20-100 MCG/ACT AERS respimat, INHALE 1 PUFF INTO THE LUNGS EVERY 4 (FOUR) HOURS AS NEEDED FOR WHEEZING., Disp: 1 Inhaler, Rfl: 5 .  fexofenadine (ALLEGRA) 60 MG tablet, Take 1 tablet (60 mg total) by mouth 2 (two) times daily., Disp: 60 tablet, Rfl: 4 .  fluticasone (FLONASE) 50 MCG/ACT nasal spray, PLACE 2 SPRAYS INTO BOTH NOSTRILS DAILY AS NEEDED FOR ALLERGIES OR RHINITIS. REPORTED ON 04/03/2015, Disp: 16 g, Rfl: 5 .  gabapentin (NEURONTIN) 300 MG capsule, TAKE 1 CAPSULE (300 MG TOTAL) BY MOUTH DAILY., Disp: 30 capsule, Rfl: 10 .  hyoscyamine (LEVSIN, ANASPAZ) 0.125 MG tablet, TAKE ONE TABLET BY MOUTH EVERY 6 HOURS AS NEEDED FOR CRAMPING FOR UP TO 10 DAYS, Disp: 30 tablet, Rfl: 5 .  ipratropium (ATROVENT) 0.03 % nasal spray, PLACE 2 SPRAYS INTO THE NOSE DAILY AS NEEDED., Disp: 30 mL, Rfl: 3 .  loperamide (IMODIUM A-D) 2 MG tablet, Take 1 tablet (2 mg total) by mouth 4 (four) times daily as needed for diarrhea or loose stools., Disp: 30 tablet, Rfl: 0 .  meloxicam (MOBIC) 15 MG tablet, Take 1 tablet (15 mg total) by mouth daily., Disp: 30 tablet, Rfl: 3 .  montelukast (SINGULAIR) 10 MG tablet, TAKE 1 TABLET (10 MG TOTAL) BY MOUTH AT BEDTIME., Disp: 30 tablet, Rfl: 12 .  nortriptyline (PAMELOR) 50 MG capsule, Take 1 capsule (50 mg total) by mouth daily as needed (headache)., Disp: 30 capsule, Rfl: 5 .  pantoprazole (PROTONIX) 40 MG tablet, TAKE 1 TABLET BY MOUTH TWICE A DAY, Disp: 60 tablet, Rfl: 5 .  promethazine (PHENERGAN) 25 MG tablet, Take 1 tablet (25 mg total) by mouth every 6 (six) hours as needed for nausea or vomiting., Disp: 12 tablet, Rfl: 0 .  estradiol (ESTRACE) 0.5 MG tablet, TAKE 1 TABLET BY MOUTH EVERY DAY (Patient not taking: Reported on 01/19/2018), Disp: 30 tablet,  Rfl: 5 .  tiotropium (SPIRIVA) 18 MCG inhalation capsule, Place 1 capsule (18 mcg total) into inhaler and inhale daily., Disp: 30 capsule, Rfl: 12  Review of Systems  Constitutional: Positive for fatigue. Negative for fever.  HENT: Positive for congestion, postnasal drip, rhinorrhea, sinus pressure and sinus pain. Negative for ear pain, sore throat and trouble swallowing.   Respiratory: Positive for cough (productive green sputum). Negative for chest tightness, shortness of breath and wheezing.   Cardiovascular: Negative for chest pain, palpitations and leg swelling.  Gastrointestinal: Negative for abdominal pain, diarrhea, nausea and vomiting.  Musculoskeletal: Positive for neck stiffness. Negative for neck pain.  Neurological: Positive for headaches. Negative for dizziness.    Social History   Tobacco Use  . Smoking status: Never Smoker  . Smokeless tobacco: Never Used  Substance Use Topics  . Alcohol use: No      Objective:   Temp 98.6 F (37 C) (Oral)  Vitals:   07/14/18 1516  Temp: 98.6 F (37 C)  TempSrc: Oral  Physical Exam Vitals signs reviewed.  Constitutional:      General: She is not in acute distress. Pulmonary:     Effort: Pulmonary effort is normal. No respiratory distress.  Neurological:     Mental Status: She is alert.         Assessment & Plan   1. Acute non-recurrent pansinusitis Worsening symptoms that have not responded to OTC medications. Will give Zpak and prednisone as below. Continue allergy medications. Stay well hydrated and get plenty of rest. Call if no symptom improvement or if symptoms worsen. - predniSONE (DELTASONE) 20 MG tablet; Take 1 tablet (20 mg total) by mouth daily with breakfast.  Dispense: 5 tablet; Refill: 0 - azithromycin (ZITHROMAX) 250 MG tablet; Take 2 tablets PO on day one, and one tablet PO daily thereafter until completed.  Dispense: 6 tablet; Refill: 1   I discussed the assessment and treatment plan with the  patient. The patient was provided an opportunity to ask questions and all were answered. The patient agreed with the plan and demonstrated an understanding of the instructions.   The patient was advised to call back or seek an in-person evaluation if the symptoms worsen or if the condition fails to improve as anticipated.  I provided 14 minutes of non-face-to-face time during this encounter.     Mar Daring, PA-C  [facility]

## 2018-07-14 NOTE — Telephone Encounter (Signed)
See message below °

## 2018-07-15 NOTE — Telephone Encounter (Signed)
She changed her mind about leaving a message, and scheduled an appointment.

## 2018-07-20 ENCOUNTER — Other Ambulatory Visit: Payer: Self-pay | Admitting: Family Medicine

## 2018-07-20 DIAGNOSIS — K219 Gastro-esophageal reflux disease without esophagitis: Secondary | ICD-10-CM

## 2018-07-20 DIAGNOSIS — F321 Major depressive disorder, single episode, moderate: Secondary | ICD-10-CM

## 2018-07-20 NOTE — Telephone Encounter (Signed)
FYI:  I called [employer] and was advised that Medicare will not cover a portable oxygen tank for travel. I called and advised patient of this and she verbally voiced understanding.

## 2018-07-22 ENCOUNTER — Telehealth: Payer: Self-pay | Admitting: Family Medicine

## 2018-07-22 NOTE — Telephone Encounter (Signed)
Deanna with [employer] called saying they received everything they requested for patient but they did not receive the office notes from her last visit  FAX  # [phone]  Thanks Con Memos

## 2018-07-23 ENCOUNTER — Other Ambulatory Visit: Payer: Self-pay | Admitting: Family Medicine

## 2018-08-15 ENCOUNTER — Other Ambulatory Visit: Payer: Self-pay

## 2018-08-15 ENCOUNTER — Other Ambulatory Visit: Payer: Self-pay | Admitting: Family Medicine

## 2018-08-15 ENCOUNTER — Encounter: Payer: Self-pay | Admitting: Family Medicine

## 2018-08-15 ENCOUNTER — Ambulatory Visit: Payer: Medicaid Other | Admitting: Family Medicine

## 2018-08-15 VITALS — BP 122/74 | HR 83 | Temp 98.0°F | Resp 18 | Wt 134.8 lb

## 2018-08-15 DIAGNOSIS — F321 Major depressive disorder, single episode, moderate: Secondary | ICD-10-CM

## 2018-08-15 DIAGNOSIS — M7742 Metatarsalgia, left foot: Secondary | ICD-10-CM

## 2018-08-15 DIAGNOSIS — Q211 Atrial septal defect, unspecified: Secondary | ICD-10-CM

## 2018-08-15 DIAGNOSIS — I351 Nonrheumatic aortic (valve) insufficiency: Secondary | ICD-10-CM

## 2018-08-15 DIAGNOSIS — I340 Nonrheumatic mitral (valve) insufficiency: Secondary | ICD-10-CM

## 2018-08-15 DIAGNOSIS — F5102 Adjustment insomnia: Secondary | ICD-10-CM

## 2018-08-15 DIAGNOSIS — T7411XS Adult physical abuse, confirmed, sequela: Secondary | ICD-10-CM

## 2018-08-15 DIAGNOSIS — M546 Pain in thoracic spine: Secondary | ICD-10-CM

## 2018-08-15 DIAGNOSIS — I361 Nonrheumatic tricuspid (valve) insufficiency: Secondary | ICD-10-CM

## 2018-08-15 DIAGNOSIS — R58 Hemorrhage, not elsewhere classified: Secondary | ICD-10-CM

## 2018-08-15 DIAGNOSIS — G8929 Other chronic pain: Secondary | ICD-10-CM

## 2018-08-15 DIAGNOSIS — T8089XA Other complications following infusion, transfusion and therapeutic injection, initial encounter: Secondary | ICD-10-CM

## 2018-08-15 DIAGNOSIS — G43809 Other migraine, not intractable, without status migrainosus: Secondary | ICD-10-CM

## 2018-08-15 MED ORDER — NORTRIPTYLINE HCL 25 MG PO CAPS: ORAL_CAPSULE | ORAL | 1 refills | Status: DC

## 2018-08-15 MED ORDER — CITALOPRAM HYDROBROMIDE 20 MG PO TABS: 20.0000 mg | ORAL_TABLET | Freq: Every day | ORAL | 1 refills | Status: DC

## 2018-08-15 MED ORDER — NAPROXEN 375 MG PO TABS: 375.0000 mg | ORAL_TABLET | Freq: Two times a day (BID) | ORAL | 1 refills | Status: DC

## 2018-08-15 NOTE — Progress Notes (Addendum)
Patient: Erin Richards Female    DOB: 12-14-1965   53 y.o.   MRN: [id number] Visit Date: 08/15/2018  Today's Provider: Lelon Huh, MD   No chief complaint on file.  Subjective:     HPI Spot on upper left eye for 1.5 weeks. Pain in left foot near great toe for a few weeks. Patient states she feels like needles sticking. Patient also states that she is having trouble sleeping since her husband's passing. Medication refill for Allegra and  Montelukast.  She also states she gets pain at the base of her left toe and forefoot when she walks on it, and burning sensation into the toe when she lays down at night, no other limbs are affected.   She also reports she has been more anxious and having more trouble sleeping since her husband passed away earlier this  Year.    She also reports she bruises easily which is very concerning for her.   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 Last overnight oximetry  06/30/2018 326 desaturation events. ODI = 35. Low O2=81%. Time <=88% = 108.1 minutes.  She is using oxygen consistently every night  Allergies  Allergen Reactions  . Acetaminophen-Codeine Nausea And Vomiting  . Antiseptic Oral Rinse  [Cetylpyridinium Chloride] Other (See Comments)    UNK  . Aspartame Other (See Comments)    Reaction: unknown  . Biaxin [Clarithromycin] Nausea And Vomiting  . Carafate [Sucralfate]     Pt says her "Stomach hurts" when she takes it.    . Chlorhexidine Gluconate Nausea And Vomiting  . Clindamycin/Lincomycin Nausea And Vomiting  . Codeine Itching and Nausea And Vomiting  . Dextromethorphan Hbr Other (See Comments)    Reaction: unknown  . Dilaudid [Hydromorphone Hcl] Nausea And Vomiting  . Doxycycline Nausea And Vomiting  . Fentanyl Nausea And Vomiting  . Fluticasone-Salmeterol Other (See Comments)    Blister in mouth Other reaction(s): Other (See  Comments) Blister in mouth  . Germanium Other (See Comments)  . Hydrocodone Nausea And Vomiting  . Ketorolac Other (See Comments)  . Levofloxacin Other (See Comments)    GI upset. GI upset  . Mefenamic Acid Nausea And Vomiting  . Metformin And Related Nausea And Vomiting  . Metronidazole Diarrhea and Nausea And Vomiting  . Morphine And Related Nausea And Vomiting  . Moxifloxacin Swelling  . Nitrofurantoin Nausea And Vomiting and Other (See Comments)  . Nsaids Other (See Comments)    Reaction: unknown  . Oxycodone-Acetaminophen Nausea And Vomiting  . Periguard [Dimethicone] Nausea And Vomiting  . Phenothiazines Nausea And Vomiting  . Pioglitazone Nausea And Vomiting  . Quinidine Nausea And Vomiting  . Quinolones Nausea And Vomiting  . Rumex Crispus Other (See Comments)  . Tetracyclines & Related Nausea And Vomiting  . Toradol [Ketorolac Tromethamine] Nausea And Vomiting  . Tramadol Nausea And Vomiting  . Tussin [Guaifenesin] Nausea And Vomiting  . Tussionex Pennkinetic Er [Hydrocod Polst-Cpm Polst Er] Nausea And Vomiting  . Buprenorphine Hcl Nausea And Vomiting  . Lincomycin Hcl Nausea And Vomiting  . Oxycodone-Acetaminophen Hives and Nausea And Vomiting    Other reaction(s): Nausea And Vomiting, Unknown  . Phenylalanine Nausea And Vomiting     Current Outpatient Medications:  .  albuterol (PROAIR HFA) 108 (90 Base) MCG/ACT inhaler, Inhale 1-2 puffs into the lungs every 6 (six) hours as needed for shortness of breath., Disp:  8.5 Inhaler, Rfl: 5 .  azelastine (ASTELIN) 0.1 % nasal spray, Place 1 spray into both nostrils 2 (two) times daily., Disp: 30 mL, Rfl: 12 .  cetirizine (ZYRTEC) 10 MG tablet, Take 1 tablet (10 mg total) by mouth daily., Disp: 30 tablet, Rfl: 11 .  citalopram (CELEXA) 10 MG tablet, TAKE 1 TABLET BY MOUTH EVERY DAY, Disp: 90 tablet, Rfl: 4 .  COMBIVENT RESPIMAT 20-100 MCG/ACT AERS respimat, INHALE 1 PUFF INTO THE LUNGS EVERY 4 (FOUR) HOURS AS NEEDED FOR  WHEEZING., Disp: 1 Inhaler, Rfl: 5 .  fexofenadine (ALLEGRA) 60 MG tablet, Take 1 tablet (60 mg total) by mouth 2 (two) times daily., Disp: 60 tablet, Rfl: 4 .  fluticasone (FLONASE) 50 MCG/ACT nasal spray, PLACE 2 SPRAYS INTO BOTH NOSTRILS DAILY AS NEEDED FOR ALLERGIES OR RHINITIS. REPORTED ON 04/03/2015, Disp: 16 g, Rfl: 5 .  gabapentin (NEURONTIN) 300 MG capsule, TAKE 1 CAPSULE (300 MG TOTAL) BY MOUTH DAILY., Disp: 90 capsule, Rfl: 4 .  hyoscyamine (LEVSIN, ANASPAZ) 0.125 MG tablet, TAKE ONE TABLET BY MOUTH EVERY 6 HOURS AS NEEDED FOR CRAMPING FOR UP TO 10 DAYS, Disp: 30 tablet, Rfl: 5 .  ipratropium (ATROVENT) 0.03 % nasal spray, PLACE 2 SPRAYS INTO THE NOSE DAILY AS NEEDED., Disp: 30 mL, Rfl: 3 .  loperamide (IMODIUM A-D) 2 MG tablet, Take 1 tablet (2 mg total) by mouth 4 (four) times daily as needed for diarrhea or loose stools., Disp: 30 tablet, Rfl: 0 .  meloxicam (MOBIC) 15 MG tablet, Take 1 tablet (15 mg total) by mouth daily., Disp: 30 tablet, Rfl: 3 .  montelukast (SINGULAIR) 10 MG tablet, TAKE 1 TABLET (10 MG TOTAL) BY MOUTH AT BEDTIME., Disp: 30 tablet, Rfl: 12 .  nortriptyline (PAMELOR) 50 MG capsule, Take 1 capsule (50 mg total) by mouth daily as needed (headache)., Disp: 30 capsule, Rfl: 5 .  pantoprazole (PROTONIX) 40 MG tablet, TAKE 1 TABLET BY MOUTH TWICE A DAY, Disp: 60 tablet, Rfl: 5 .  azithromycin (ZITHROMAX) 250 MG tablet, Take 2 tablets PO on day one, and one tablet PO daily thereafter until completed. (Patient not taking: Reported on 08/15/2018), Disp: 6 tablet, Rfl: 1 .  estradiol (ESTRACE) 0.5 MG tablet, TAKE 1 TABLET BY MOUTH EVERY DAY (Patient not taking: Reported on 01/19/2018), Disp: 30 tablet, Rfl: 5 .  predniSONE (DELTASONE) 20 MG tablet, Take 1 tablet (20 mg total) by mouth daily with breakfast. (Patient not taking: Reported on 08/15/2018), Disp: 5 tablet, Rfl: 0 .  promethazine (PHENERGAN) 25 MG tablet, Take 1 tablet (25 mg total) by mouth every 6 (six) hours as needed  for nausea or vomiting. (Patient not taking: Reported on 08/15/2018), Disp: 12 tablet, Rfl: 0 .  tiotropium (SPIRIVA) 18 MCG inhalation capsule, Place 1 capsule (18 mcg total) into inhaler and inhale daily., Disp: 30 capsule, Rfl: 12  Review of Systems  Constitutional: Negative for appetite change, chills, fatigue and fever.  Respiratory: Negative for chest tightness and shortness of breath.   Cardiovascular: Negative for chest pain and palpitations.  Gastrointestinal: Negative for abdominal pain, nausea and vomiting.  Neurological: Negative for dizziness and weakness.  Psychiatric/Behavioral: Positive for sleep disturbance.    Social History   Tobacco Use  . Smoking status: Never Smoker  . Smokeless tobacco: Never Used  Substance Use Topics  . Alcohol use: No      Objective:   BP 122/74 (BP Location: Left Arm, Patient Position: Sitting, Cuff Size: Normal)   Pulse 83   Temp  98 F (36.7 C) (Oral)   Resp 18   Wt 134 lb 12.8 oz (61.1 kg)   SpO2 96%   BMI 29.17 kg/m  Vitals:   08/15/18 1444  BP: 122/74  Pulse: 83  Resp: 18  Temp: 98 F (36.7 C)  TempSrc: Oral  SpO2: 96%  Weight: 134 lb 12.8 oz (61.1 kg)     Physical Exam    General Appearance:    Alert, cooperative, no distress  Eyes:    PERRL, conjunctiva/corneas clear, EOM's intact       Lungs:     Clear to auscultation bilaterally, respirations unlabored  Heart:    Regular rate and rhythm  Neurologic:   Awake, alert, oriented x 3. No apparent focal neurological           defect.            Assessment & Plan    1. Major depressive disorder, single episode, moderate (HCC) Worsening since passing or her husband earlier this year, will increase citalopram (CELEXA) 20 MG tablet; Take 1 tablet (20 mg total) by mouth daily.  Dispense: 90 tablet; Refill: 1  2. Other migraine without status migrainosus, not intractable She has been having trouble sleeping, so will change from prn 50mg  nortriptyline to -  nortriptyline (PAMELOR) 25 MG capsule; Take one every day at bedtime and one during the day as needed for migraines.  Dispense: 60 capsule; Refill: 1  3. Adjustment insomnia Start qhs nortriptyline as above.   4. Abnormal bleeding as manifestation of blood transfusion reaction, initial encounter  - Platelet count - PT and PTT  5. Chronic bilateral thoracic back pain No relief from meloxicam and sensitive to high dose NSAIDS, try low dose- naproxen (NAPROSYN) 375 MG tablet; Take 1 tablet (375 mg total) by mouth 2 (two) times daily with a meal.  Dispense: 30 tablet; Refill: 1  Consider chiropractic treatments or PT  6. Metatarsalgia of left foot  - naproxen (NAPROSYN) 375 MG tablet; Take 1 tablet (375 mg total) by mouth 2 (two) times daily with a meal.  Dispense: 30 tablet; Refill: 1 Consider podiatry referral  Addendum 08/26/2018  Nocturnal hypoxia Doing well and medically benefiting from use of Nocturnal oxygen which she is very compliant with and using every nigh.     Lelon Huh, MD  [facility]

## 2018-08-15 NOTE — Patient Instructions (Signed)
.   Please review the attached list of medications and notify my office if there are any errors.   . Please bring all of your medications to every appointment so we can make sure that our medication list is the same as yours.   . We will have flu vaccines available after Labor Day. Please go to your pharmacy or call the office in early September to schedule you flu shot.   Try using an OTC metatarsal pad to help with foot and toe pain

## 2018-08-16 ENCOUNTER — Telehealth: Payer: Self-pay

## 2018-08-16 DIAGNOSIS — M25519 Pain in unspecified shoulder: Secondary | ICD-10-CM

## 2018-08-16 DIAGNOSIS — G8929 Other chronic pain: Secondary | ICD-10-CM

## 2018-08-16 DIAGNOSIS — M545 Low back pain, unspecified: Secondary | ICD-10-CM

## 2018-08-16 LAB — PT AND PTT
INR: 1 (ref 0.8–1.2)
Prothrombin Time: 10.3 s (ref 9.1–12.0)
aPTT: 27 s (ref 24–33)

## 2018-08-16 LAB — PLATELET COUNT: Platelets: 168 10*3/uL (ref 150–450)

## 2018-08-16 NOTE — Telephone Encounter (Signed)
-----   Message from Birdie Sons, MD sent at 08/16/2018  7:10 AM EDT ----- platelets and blood clotting tests are normal. She is just in easy bruiser. No clotting disorders.

## 2018-08-16 NOTE — Telephone Encounter (Signed)
Patient states that she needs a referral for continued physical therapy for shoulder and back pain through [facility]. She is also requesting information about a possible remedy of the spot on her left eye that was addressed yesterday in her visit. She was notified of lab results.

## 2018-08-18 NOTE — Telephone Encounter (Signed)
Only way to get rid of it is to see an eye surgeon. But it is completely benign and does not to be removed.   Have sent referral order to [facility].

## 2018-08-18 NOTE — Telephone Encounter (Signed)
Pt advised.   Thanks,   -Keithan Dileonardo  

## 2018-08-25 ENCOUNTER — Encounter: Payer: Self-pay | Admitting: Physician Assistant

## 2018-08-25 ENCOUNTER — Ambulatory Visit
Admission: RE | Admit: 2018-08-25 | Discharge: 2018-08-25 | Disposition: A | Discharge status: Home / Self Care | Payer: Medicaid Other | Source: Ambulatory Visit | Attending: Physician Assistant | Admitting: Physician Assistant

## 2018-08-25 ENCOUNTER — Ambulatory Visit (INDEPENDENT_AMBULATORY_CARE_PROVIDER_SITE_OTHER): Payer: Medicaid Other | Admitting: Physician Assistant

## 2018-08-25 ENCOUNTER — Ambulatory Visit
Admission: RE | Admit: 2018-08-25 | Discharge: 2018-08-25 | Disposition: A | Discharge status: Home / Self Care | Payer: Medicaid Other | Attending: Physician Assistant | Admitting: Physician Assistant

## 2018-08-25 ENCOUNTER — Other Ambulatory Visit: Payer: Self-pay

## 2018-08-25 VITALS — BP 114/59 | HR 80 | Temp 97.7°F | Wt 136.0 lb

## 2018-08-25 DIAGNOSIS — M542 Cervicalgia: Secondary | ICD-10-CM

## 2018-08-25 DIAGNOSIS — M62838 Other muscle spasm: Secondary | ICD-10-CM

## 2018-08-25 MED ORDER — BACLOFEN 10 MG PO TABS: 10.0000 mg | ORAL_TABLET | Freq: Three times a day (TID) | ORAL | 0 refills | Status: DC

## 2018-08-25 NOTE — Patient Instructions (Signed)
Baclofen tablets What is this medicine? BACLOFEN (BAK loe fen) helps relieve spasms and cramping of muscles. It may be used to treat symptoms of multiple sclerosis or spinal cord injury. This medicine may be used for other purposes; ask your health care provider or pharmacist if you have questions. COMMON BRAND NAME(S): ED Baclofen, Lioresal What should I tell my health care provider before I take this medicine? They need to know if you have any of these conditions:  kidney disease  seizures  stroke  an unusual or allergic reaction to baclofen, other medicines, foods, dyes, or preservatives  pregnant or trying to get pregnant  breast-feeding How should I use this medicine? Take this medicine by mouth. Swallow it with a drink of water. Follow the directions on the prescription label. Do not take more medicine than you are told to take. Talk to your pediatrician regarding the use of this medicine in children. Special care may be needed. Overdosage: If you think you have taken too much of this medicine contact a poison control center or emergency room at once. NOTE: This medicine is only for you. Do not share this medicine with others. What if I miss a dose? If you miss a dose, take it as soon as you can. If it is almost time for your next dose, take only that dose. Do not take double or extra doses. What may interact with this medicine? Do not take this medication with any of the following medicines:  narcotic medicines for cough This medicine may also interact with the following medications:  alcohol  antihistamines for allergy, cough and cold  certain medicines for anxiety or sleep  certain medicines for depression like amitriptyline, fluoxetine, sertraline  certain medicines for seizures like phenobarbital, primidone  general anesthetics like halothane, isoflurane, methoxyflurane, propofol  local anesthetics like lidocaine, pramoxine, tetracaine  medicines that relax  muscles for surgery  narcotic medicines for pain  phenothiazines like chlorpromazine, mesoridazine, prochlorperazine, thioridazine This list may not describe all possible interactions. Give your health care provider a list of all the medicines, herbs, non-prescription drugs, or dietary supplements you use. Also tell them if you smoke, drink alcohol, or use illegal drugs. Some items may interact with your medicine. What should I watch for while using this medicine? Tell your doctor or health care professional if your symptoms do not start to get better or if they get worse. Do not suddenly stop taking your medicine. If you do, you may develop a severe reaction. If your doctor wants you to stop the medicine, the dose will be slowly lowered over time to avoid any side effects. Follow the advice of your doctor. You may get drowsy or dizzy. Do not drive, use machinery, or do anything that needs mental alertness until you know how this medicine affects you. Do not stand or sit up quickly, especially if you are an older patient. This reduces the risk of dizzy or fainting spells. Alcohol may interfere with the effect of this medicine. Avoid alcoholic drinks. If you are taking another medicine that also causes drowsiness, you may have more side effects. Give your health care provider a list of all medicines you use. Your doctor will tell you how much medicine to take. Do not take more medicine than directed. Call emergency for help if you have problems breathing or unusual sleepiness. What side effects may I notice from receiving this medicine? Side effects that you should report to your doctor or health care professional as soon as  possible:  allergic reactions like skin rash, itching or hives, swelling of the face, lips, or tongue  breathing problems  changes in emotions or moods  changes in vision  chest pain  fast, irregular heartbeat  feeling faint or lightheaded, falls  hallucinations   loss of balance or coordination  ringing of the ears  seizures  trouble passing urine or change in the amount of urine  trouble walking  unusually weak or tired Side effects that usually do not require medical attention (report to your doctor or health care professional if they continue or are bothersome):  changes in taste  confusion  constipation  diarrhea  dry mouth  headache  muscle weakness  nausea, vomiting  trouble sleeping This list may not describe all possible side effects. Call your doctor for medical advice about side effects. You may report side effects to FDA at 1-800-FDA-1088. Where should I keep my medicine? Keep out of the reach of children. Store at room temperature between 15 and 30 degrees C (59 and 86 degrees F). Keep container tightly closed. Throw away any unused medicine after the expiration date. NOTE: This sheet is a summary. It may not cover all possible information. If you have questions about this medicine, talk to your doctor, pharmacist, or health care provider.  2020 Elsevier/Gold Standard (2016-10-31 09:56:42) Neck Exercises Ask your health care provider which exercises are safe for you. Do exercises exactly as told by your health care provider and adjust them as directed. It is normal to feel mild stretching, pulling, tightness, or discomfort as you do these exercises. Stop right away if you feel sudden pain or your pain gets worse. Do not begin these exercises until told by your health care provider. Neck exercises can be important for many reasons. They can improve strength and maintain flexibility in your neck, which will help your upper back and prevent neck pain. Stretching exercises Rotation neck stretching  1. Sit in a chair or stand up. 2. Place your feet flat on the floor, shoulder width apart. 3. Slowly turn your head (rotate) to the right until a slight stretch is felt. Turn it all the way to the right so you can look over your  right shoulder. Do not tilt or tip your head. 4. Hold this position for 10-30 seconds. 5. Slowly turn your head (rotate) to the left until a slight stretch is felt. Turn it all the way to the left so you can look over your left shoulder. Do not tilt or tip your head. 6. Hold this position for 10-30 seconds. Repeat __________ times. Complete this exercise __________ times a day. Neck retraction 1. Sit in a sturdy chair or stand up. 2. Look straight ahead. Do not bend your neck. 3. Use your fingers to push your chin backward (retraction). Do not bend your neck for this movement. Continue to face straight ahead. If you are doing the exercise properly, you will feel a slight sensation in your throat and a stretch at the back of your neck. 4. Hold the stretch for 1-2 seconds. Repeat __________ times. Complete this exercise __________ times a day. Strengthening exercises Neck press 1. Lie on your back on a firm bed or on the floor with a pillow under your head. 2. Use your neck muscles to push your head down on the pillow and straighten your spine. 3. Hold the position as well as you can. Keep your head facing up (in a neutral position) and your chin tucked. 4. Slowly  count to 5 while holding this position. Repeat __________ times. Complete this exercise __________ times a day. Isometrics These are exercises in which you strengthen the muscles in your neck while keeping your neck still (isometrics). 1. Sit in a supportive chair and place your hand on your forehead. 2. Keep your head and face facing straight ahead. Do not flex or extend your neck while doing isometrics. 3. Push forward with your head and neck while pushing back with your hand. Hold for 10 seconds. 4. Do the sequence again, this time putting your hand against the back of your head. Use your head and neck to push backward against the hand pressure. 5. Finally, do the same exercise on either side of your head, pushing sideways against  the pressure of your hand. Repeat __________ times. Complete this exercise __________ times a day. Prone head lifts 1. Lie face-down (prone position), resting on your elbows so that your chest and upper back are raised. 2. Start with your head facing downward, near your chest. Position your chin either on or near your chest. 3. Slowly lift your head upward. Lift until you are looking straight ahead. Then continue lifting your head as far back as you can comfortably stretch. 4. Hold your head up for 5 seconds. Then slowly lower it to your starting position. Repeat __________ times. Complete this exercise __________ times a day. Supine head lifts 1. Lie on your back (supine position), bending your knees to point to the ceiling and keeping your feet flat on the floor. 2. Lift your head slowly off the floor, raising your chin toward your chest. 3. Hold for 5 seconds. Repeat __________ times. Complete this exercise __________ times a day. Scapular retraction 1. Stand with your arms at your sides. Look straight ahead. 2. Slowly pull both shoulders (scapulae) backward and downward (retraction) until you feel a stretch between your shoulder blades in your upper back. 3. Hold for 10-30 seconds. 4. Relax and repeat. Repeat __________ times. Complete this exercise __________ times a day. Contact a health care provider if:  Your neck pain or discomfort gets much worse when you do an exercise.  Your neck pain or discomfort does not improve within 2 hours after you exercise. If you have any of these problems, stop exercising right away. Do not do the exercises again unless your health care provider says that you can. Get help right away if:  You develop sudden, severe neck pain. If this happens, stop exercising right away. Do not do the exercises again unless your health care provider says that you can. This information is not intended to replace advice given to you by your health care provider. Make  sure you discuss any questions you have with your health care provider. Document Released: 12/31/2014 Document Revised: 11/17/2017 Document Reviewed: 11/17/2017 Elsevier Patient Education  2020 [employer].

## 2018-08-25 NOTE — Progress Notes (Signed)
Patient: Erin Richards Female    DOB: 25-Nov-1965   53 y.o.   MRN: [id number] Visit Date: 08/25/2018  Today's Provider: Mar Daring, PA-C   Chief Complaint  Patient presents with  . Neck Pain    started about a week ago   Subjective:     Neck Pain  This is a new problem. The current episode started in the past 7 days. The problem has been gradually worsening. The pain is associated with nothing. The pain is present in the midline. The quality of the pain is described as shooting. Nothing aggravates the symptoms. Pertinent negatives include no numbness or weakness. She has tried acetaminophen for the symptoms. The treatment provided no relief.   Patient reports that neck pain started a few weeks ago, she just forgot to tell Dr. Caryn Section about it when she was here on 08/16/18. She reports that pain is intermittent. Can come on with any activity. Occurs near midline but worse on the right. When it happens it is a sharp, shooting pain and she cannot move. Denies any radiating symptoms, numbness or tingling into extremities. Reports it does not happen everyday but has been worsening. Yesterday it happened 4-5 times. She is on Naprosyn 375mg  for shoulder pain. Reports she is only taking one daily due to drowsiness side effect, instead of BID.   Allergies  Allergen Reactions  . Acetaminophen-Codeine Nausea And Vomiting  . Antiseptic Oral Rinse  [Cetylpyridinium Chloride] Other (See Comments)    UNK  . Aspartame Other (See Comments)    Reaction: unknown  . Biaxin [Clarithromycin] Nausea And Vomiting  . Carafate [Sucralfate]     Pt says her "Stomach hurts" when she takes it.    . Chlorhexidine Gluconate Nausea And Vomiting  . Clindamycin/Lincomycin Nausea And Vomiting  . Codeine Itching and Nausea And Vomiting  . Dextromethorphan Hbr Other (See Comments)    Reaction: unknown  . Dilaudid [Hydromorphone Hcl] Nausea And Vomiting  . Doxycycline Nausea And Vomiting  . Fentanyl  Nausea And Vomiting  . Fluticasone-Salmeterol Other (See Comments)    Blister in mouth Other reaction(s): Other (See Comments) Blister in mouth  . Germanium Other (See Comments)  . Hydrocodone Nausea And Vomiting  . Ketorolac Other (See Comments)  . Levofloxacin Other (See Comments)    GI upset. GI upset  . Mefenamic Acid Nausea And Vomiting  . Metformin And Related Nausea And Vomiting  . Metronidazole Diarrhea and Nausea And Vomiting  . Morphine And Related Nausea And Vomiting  . Moxifloxacin Swelling  . Nitrofurantoin Nausea And Vomiting and Other (See Comments)  . Nsaids Other (See Comments)    Reaction: unknown  . Oxycodone-Acetaminophen Nausea And Vomiting  . Periguard [Dimethicone] Nausea And Vomiting  . Phenothiazines Nausea And Vomiting  . Pioglitazone Nausea And Vomiting  . Quinidine Nausea And Vomiting  . Quinolones Nausea And Vomiting  . Rumex Crispus Other (See Comments)  . Tetracyclines & Related Nausea And Vomiting  . Toradol [Ketorolac Tromethamine] Nausea And Vomiting  . Tramadol Nausea And Vomiting  . Tussin [Guaifenesin] Nausea And Vomiting  . Tussionex Pennkinetic Er [Hydrocod Polst-Cpm Polst Er] Nausea And Vomiting  . Buprenorphine Hcl Nausea And Vomiting  . Lincomycin Hcl Nausea And Vomiting  . Oxycodone-Acetaminophen Hives and Nausea And Vomiting    Other reaction(s): Nausea And Vomiting, Unknown  . Phenylalanine Nausea And Vomiting     Current Outpatient Medications:  .  albuterol (PROAIR HFA) 108 (90 Base) MCG/ACT inhaler,  Inhale 1-2 puffs into the lungs every 6 (six) hours as needed for shortness of breath., Disp: 8.5 Inhaler, Rfl: 5 .  azelastine (ASTELIN) 0.1 % nasal spray, Place 1 spray into both nostrils 2 (two) times daily., Disp: 30 mL, Rfl: 12 .  cetirizine (ZYRTEC) 10 MG tablet, Take 1 tablet (10 mg total) by mouth daily., Disp: 30 tablet, Rfl: 11 .  citalopram (CELEXA) 20 MG tablet, Take 1 tablet (20 mg total) by mouth daily., Disp: 90  tablet, Rfl: 1 .  COMBIVENT RESPIMAT 20-100 MCG/ACT AERS respimat, INHALE 1 PUFF INTO THE LUNGS EVERY 4 (FOUR) HOURS AS NEEDED FOR WHEEZING., Disp: 1 Inhaler, Rfl: 5 .  fexofenadine (ALLEGRA) 60 MG tablet, Take 1 tablet (60 mg total) by mouth 2 (two) times daily., Disp: 60 tablet, Rfl: 4 .  fluticasone (FLONASE) 50 MCG/ACT nasal spray, PLACE 2 SPRAYS INTO BOTH NOSTRILS DAILY AS NEEDED FOR ALLERGIES OR RHINITIS. REPORTED ON 04/03/2015, Disp: 16 g, Rfl: 5 .  gabapentin (NEURONTIN) 300 MG capsule, TAKE 1 CAPSULE (300 MG TOTAL) BY MOUTH DAILY., Disp: 90 capsule, Rfl: 4 .  hyoscyamine (LEVSIN, ANASPAZ) 0.125 MG tablet, TAKE ONE TABLET BY MOUTH EVERY 6 HOURS AS NEEDED FOR CRAMPING FOR UP TO 10 DAYS, Disp: 30 tablet, Rfl: 5 .  ipratropium (ATROVENT) 0.03 % nasal spray, PLACE 2 SPRAYS INTO THE NOSE DAILY AS NEEDED., Disp: 30 mL, Rfl: 3 .  loperamide (IMODIUM A-D) 2 MG tablet, Take 1 tablet (2 mg total) by mouth 4 (four) times daily as needed for diarrhea or loose stools., Disp: 30 tablet, Rfl: 0 .  montelukast (SINGULAIR) 10 MG tablet, TAKE 1 TABLET (10 MG TOTAL) BY MOUTH AT BEDTIME., Disp: 30 tablet, Rfl: 12 .  naproxen (NAPROSYN) 375 MG tablet, Take 1 tablet (375 mg total) by mouth 2 (two) times daily with a meal., Disp: 30 tablet, Rfl: 1 .  nortriptyline (PAMELOR) 25 MG capsule, Take one every day at bedtime and one during the day as needed for migraines., Disp: 60 capsule, Rfl: 1 .  pantoprazole (PROTONIX) 40 MG tablet, TAKE 1 TABLET BY MOUTH TWICE A DAY, Disp: 60 tablet, Rfl: 5 .  tiotropium (SPIRIVA) 18 MCG inhalation capsule, Place 1 capsule (18 mcg total) into inhaler and inhale daily., Disp: 30 capsule, Rfl: 12  Review of Systems  Constitutional: Negative.   Respiratory: Negative.   Cardiovascular: Negative.   Gastrointestinal: Negative.   Musculoskeletal: Positive for arthralgias, neck pain and neck stiffness. Negative for back pain, gait problem, joint swelling and myalgias.  Neurological:  Negative for weakness and numbness.  Hematological: Negative for adenopathy.    Social History   Tobacco Use  . Smoking status: Never Smoker  . Smokeless tobacco: Never Used  Substance Use Topics  . Alcohol use: No      Objective:   BP (!) 114/59 (BP Location: Right Arm, Patient Position: Sitting, Cuff Size: Normal)   Pulse 80   Temp 97.7 F (36.5 C) (Oral)   Wt 136 lb (61.7 kg)   BMI 29.43 kg/m  Vitals:   08/25/18 1004  BP: (!) 114/59  Pulse: 80  Temp: 97.7 F (36.5 C)  TempSrc: Oral  Weight: 136 lb (61.7 kg)     Physical Exam Vitals signs reviewed.  Constitutional:      General: She is not in acute distress.    Appearance: Normal appearance. She is well-developed. She is not ill-appearing or diaphoretic.  HENT:     Head: Normocephalic and atraumatic.  Neck:  Musculoskeletal: Normal range of motion and neck supple. Normal range of motion. Muscular tenderness (right upper trap was tender to palpation; no spasm palpated) present. No crepitus, pain with movement, torticollis or spinous process tenderness.     Thyroid: No thyromegaly.     Vascular: No JVD.     Trachea: No tracheal deviation.  Cardiovascular:     Rate and Rhythm: Normal rate and regular rhythm.     Heart sounds: Normal heart sounds. No murmur. No friction rub. No gallop.   Pulmonary:     Effort: Pulmonary effort is normal. No respiratory distress.     Breath sounds: Normal breath sounds. No wheezing or rales.  Lymphadenopathy:     Cervical: No cervical adenopathy.  Neurological:     Mental Status: She is alert.      No results found for any visits on 08/25/18.     Assessment & Plan    1. Neck pain Will get imaging as below to r/o any bony abnormality. I do not suspect anything severe as the pain is coming and going. This makes me believe it may be more associated with spasm. Want to make sure there is nothing that may be causing her spasms, however. I will treat spasm with Baclofen as  below. Continue Naprosyn. Heating pad. Neck stretches and exercises printed for patient on AVS. May benefit from massage therapy, PT, and/or chiropractic care in the future if not resolving.  - DG Cervical Spine Complete; Future  2. Muscle spasm See above medical treatment plan. - baclofen (LIORESAL) 10 MG tablet; Take 1 tablet (10 mg total) by mouth 3 (three) times daily.  Dispense: 30 each; Refill: 0     Mar Daring, PA-C  [facility]

## 2018-08-26 ENCOUNTER — Telehealth: Payer: Self-pay

## 2018-08-26 ENCOUNTER — Encounter: Payer: Self-pay | Admitting: Family Medicine

## 2018-08-26 NOTE — Telephone Encounter (Signed)
Pt advised.   Thanks,   -Laura  

## 2018-08-26 NOTE — Telephone Encounter (Signed)
-----   Message from Mar Daring, [place] sent at 08/25/2018  4:55 PM EDT ----- Degenerative (arthritic) changes noted at C5-C6 and C6-C7. Can try conservative treatment as discussed today and if not improving may benefit from physical therapy.

## 2018-08-28 ENCOUNTER — Emergency Department
Admission: EM | Admit: 2018-08-28 | Discharge: 2018-08-28 | Disposition: A | Discharge status: Home / Self Care | Payer: Medicaid Other | Attending: Student in an Organized Health Care Education/Training Program | Admitting: Student in an Organized Health Care Education/Training Program

## 2018-08-28 DIAGNOSIS — Y929 Unspecified place or not applicable: Secondary | ICD-10-CM | POA: Diagnosis not present

## 2018-08-28 DIAGNOSIS — Y939 Activity, unspecified: Secondary | ICD-10-CM | POA: Diagnosis not present

## 2018-08-28 DIAGNOSIS — W57XXXA Bitten or stung by nonvenomous insect and other nonvenomous arthropods, initial encounter: Secondary | ICD-10-CM | POA: Diagnosis not present

## 2018-08-28 DIAGNOSIS — Y999 Unspecified external cause status: Secondary | ICD-10-CM | POA: Insufficient documentation

## 2018-08-28 DIAGNOSIS — L089 Local infection of the skin and subcutaneous tissue, unspecified: Secondary | ICD-10-CM | POA: Insufficient documentation

## 2018-08-28 DIAGNOSIS — S60562A Insect bite (nonvenomous) of left hand, initial encounter: Secondary | ICD-10-CM | POA: Diagnosis present

## 2018-08-28 DIAGNOSIS — Z79899 Other long term (current) drug therapy: Secondary | ICD-10-CM | POA: Insufficient documentation

## 2018-08-28 LAB — CBC WITH DIFFERENTIAL/PLATELET
Abs Immature Granulocytes: 0 K/uL (ref 0.00–0.07)
Basophils Absolute: 0 K/uL (ref 0.0–0.1)
Basophils Relative: 1 %
Eosinophils Absolute: 0.1 K/uL (ref 0.0–0.5)
Eosinophils Relative: 2 %
HCT: 35.6 % — ABNORMAL LOW (ref 36.0–46.0)
Hemoglobin: 11.4 g/dL — ABNORMAL LOW (ref 12.0–15.0)
Immature Granulocytes: 0 %
Lymphocytes Relative: 26 %
Lymphs Abs: 1 K/uL (ref 0.7–4.0)
MCH: 26.3 pg (ref 26.0–34.0)
MCHC: 32 g/dL (ref 30.0–36.0)
MCV: 82 fL (ref 80.0–100.0)
Monocytes Absolute: 0.3 K/uL (ref 0.1–1.0)
Monocytes Relative: 7 %
Neutro Abs: 2.6 K/uL (ref 1.7–7.7)
Neutrophils Relative %: 64 %
Platelets: 153 K/uL (ref 150–400)
RBC: 4.34 MIL/uL (ref 3.87–5.11)
RDW: 14.1 % (ref 11.5–15.5)
WBC: 4 K/uL (ref 4.0–10.5)
nRBC: 0 % (ref 0.0–0.2)

## 2018-08-28 LAB — COMPREHENSIVE METABOLIC PANEL
ALT: 10 U/L (ref 0–44)
AST: 17 U/L (ref 15–41)
Albumin: 4.1 g/dL (ref 3.5–5.0)
Alkaline Phosphatase: 71 U/L (ref 38–126)
Anion gap: 6 (ref 5–15)
BUN: 14 mg/dL (ref 6–20)
CO2: 28 mmol/L (ref 22–32)
Calcium: 9.3 mg/dL (ref 8.9–10.3)
Chloride: 106 mmol/L (ref 98–111)
Creatinine, Ser: 0.73 mg/dL (ref 0.44–1.00)
GFR calc Af Amer: >60 mL/min (ref >60)
GFR calc non Af Amer: >60 mL/min (ref >60)
Glucose, Bld: 89 mg/dL (ref 70–99)
Potassium: 5.3 mmol/L — ABNORMAL HIGH (ref 3.5–5.1)
Sodium: 140 mmol/L (ref 135–145)
Total Bilirubin: 1 mg/dL (ref 0.3–1.2)
Total Protein: 6.8 g/dL (ref 6.5–8.1)

## 2018-08-28 MED ORDER — CEPHALEXIN 500 MG PO CAPS: 500.0000 mg | ORAL_CAPSULE | Freq: Three times a day (TID) | ORAL | 0 refills | Status: DC
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
## 2018-08-28 NOTE — Discharge Instructions (Signed)
Follow-up with your primary care provider if any continued problems.  Begin soaking your hand in warm water 2-3 times a day.  Begin taking antibiotics as directed for infection to your hand.  If any continued problems your doctor should follow-up with you.  Also try eating better by improving your diet.  Dark leafy vegetables will help bring your iron up as well as protein.

## 2018-08-28 NOTE — ED Triage Notes (Signed)
Pt c/o small swollen area to the anterior left middle finger for the past couple of days.

## 2018-08-29 ENCOUNTER — Telehealth: Payer: Self-pay | Admitting: Family Medicine

## 2018-08-29 DIAGNOSIS — M47812 Spondylosis without myelopathy or radiculopathy, cervical region: Secondary | ICD-10-CM

## 2018-08-29 NOTE — Telephone Encounter (Signed)
Referral placed.
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
## 2018-09-05 ENCOUNTER — Emergency Department: Payer: Medicaid Other

## 2018-09-05 ENCOUNTER — Telehealth: Payer: Self-pay

## 2018-09-05 ENCOUNTER — Encounter: Payer: Self-pay | Admitting: Emergency Medicine

## 2018-09-05 ENCOUNTER — Emergency Department
Admission: EM | Admit: 2018-09-05 | Discharge: 2018-09-05 | Disposition: A | Discharge status: Home / Self Care | Payer: Medicaid Other | Attending: Emergency Medicine | Admitting: Emergency Medicine

## 2018-09-05 ENCOUNTER — Other Ambulatory Visit: Payer: Self-pay

## 2018-09-05 DIAGNOSIS — Y929 Unspecified place or not applicable: Secondary | ICD-10-CM | POA: Diagnosis not present

## 2018-09-05 DIAGNOSIS — S0990XA Unspecified injury of head, initial encounter: Secondary | ICD-10-CM | POA: Diagnosis not present

## 2018-09-05 DIAGNOSIS — Z79899 Other long term (current) drug therapy: Secondary | ICD-10-CM | POA: Diagnosis not present

## 2018-09-05 DIAGNOSIS — Y999 Unspecified external cause status: Secondary | ICD-10-CM | POA: Diagnosis not present

## 2018-09-05 DIAGNOSIS — Y939 Activity, unspecified: Secondary | ICD-10-CM | POA: Insufficient documentation

## 2018-09-05 MED ORDER — MELOXICAM 15 MG PO TABS: 15.0000 mg | ORAL_TABLET | Freq: Every day | ORAL | 1 refills | Status: AC
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
## 2018-09-05 NOTE — Telephone Encounter (Signed)
Patient requesting a call back to discuss possibility of therapy needed.
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
## 2018-09-05 NOTE — Telephone Encounter (Signed)
Pt calling back.  Needing a call back before end of day.  Thanks, [employer]
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
## 2018-09-06 ENCOUNTER — Ambulatory Visit: Payer: Self-pay | Admitting: *Deleted

## 2018-09-06 ENCOUNTER — Other Ambulatory Visit: Payer: Self-pay | Admitting: Family Medicine

## 2018-09-06 ENCOUNTER — Encounter: Payer: Self-pay | Admitting: Family Medicine

## 2018-09-06 ENCOUNTER — Ambulatory Visit (INDEPENDENT_AMBULATORY_CARE_PROVIDER_SITE_OTHER): Payer: Medicaid Other | Admitting: Family Medicine

## 2018-09-06 VITALS — BP 99/56 | HR 81 | Temp 98.1°F | Wt 137.0 lb

## 2018-09-06 DIAGNOSIS — F321 Major depressive disorder, single episode, moderate: Secondary | ICD-10-CM

## 2018-09-06 DIAGNOSIS — T7411XS Adult physical abuse, confirmed, sequela: Secondary | ICD-10-CM | POA: Diagnosis not present

## 2018-09-06 DIAGNOSIS — G43809 Other migraine, not intractable, without status migrainosus: Secondary | ICD-10-CM

## 2018-09-06 MED ORDER — CITALOPRAM HYDROBROMIDE 40 MG PO TABS: 40.0000 mg | ORAL_TABLET | Freq: Every day | ORAL | 2 refills | Status: DC
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
## 2018-09-06 NOTE — Telephone Encounter (Signed)
Apt made for today at 2pm   Thanks,   -Mickel Baas

## 2018-09-06 NOTE — Patient Instructions (Signed)
.   Please review the attached list of medications and notify my office if there are any errors.   . Please bring all of your medications to every appointment so we can make sure that our medication list is the same as yours.   . We will have flu vaccines available after Labor Day. Please go to your pharmacy or call the office in early September to schedule you flu shot.   
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
## 2018-09-06 NOTE — Patient Instructions (Signed)
Thank you allowing the Chronic Care Management Team to be a part of your care! It was a pleasure speaking with you today!   Erin Richards was given information about Care Management services today including:  1. Care Management services include personalized support from designated clinical staff supervised by her physician, including individualized plan of care and coordination with other care providers 2. 24/7 contact phone numbers for assistance for urgent and routine care needs. 3. The patient may stop Care Management services at any time (effective at the end of the month) by phone call to the office staff.  1.   CCM (Chronic Care Management) Team   Trish Fountain RN, BSN Nurse Care Coordinator  [phone]  Ruben Reason PharmD  Clinical Pharmacist  [phone]   [place], LCSW Clinical Social Worker [phone]  Goals Addressed   None      The patient verbalized understanding of instructions provided today and declined a print copy of patient instruction materials.   Telephone follow up appointment with care management team member scheduled for:09/09/18

## 2018-09-07 ENCOUNTER — Other Ambulatory Visit: Payer: Self-pay

## 2018-09-07 ENCOUNTER — Ambulatory Visit: Payer: Medicaid Other | Attending: Family Medicine

## 2018-09-07 DIAGNOSIS — M542 Cervicalgia: Secondary | ICD-10-CM | POA: Insufficient documentation

## 2018-09-07 DIAGNOSIS — G8929 Other chronic pain: Secondary | ICD-10-CM

## 2018-09-07 DIAGNOSIS — M25512 Pain in left shoulder: Secondary | ICD-10-CM | POA: Insufficient documentation

## 2018-09-07 DIAGNOSIS — M546 Pain in thoracic spine: Secondary | ICD-10-CM | POA: Diagnosis present

## 2018-09-07 DIAGNOSIS — M25511 Pain in right shoulder: Secondary | ICD-10-CM | POA: Diagnosis present

## 2018-09-07 DIAGNOSIS — M545 Low back pain, unspecified: Secondary | ICD-10-CM

## 2018-09-07 DIAGNOSIS — M6281 Muscle weakness (generalized): Secondary | ICD-10-CM | POA: Diagnosis present
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
## 2018-09-09 ENCOUNTER — Ambulatory Visit: Payer: Self-pay | Admitting: *Deleted

## 2018-09-09 ENCOUNTER — Encounter: Payer: Self-pay | Admitting: *Deleted
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
## 2018-09-09 NOTE — Patient Instructions (Signed)
Thank you allowing the Chronic Care Management Team to be a part of your care! It was a pleasure speaking with you today!  1. Please call this social worker with any community resource needs in the future.  CCM (Chronic Care Management) Team   Trish Fountain RN, BSN Nurse Care Coordinator  [phone]  Ruben Reason PharmD  Clinical Pharmacist  [phone]   [place], LCSW Clinical Social Worker [phone]  Goals Addressed   None      The patient verbalized understanding of instructions provided today and declined a print copy of patient instruction materials.   No further follow up required: please call this social worker if there are any community resource needs in the future

## 2018-09-12 ENCOUNTER — Telehealth: Payer: Self-pay | Admitting: Family Medicine

## 2018-09-12 NOTE — Telephone Encounter (Signed)
Pt needing to find out if she is ok to start a gym membership.  Pt also hasn't heard back from the chiropractor to make an appt.  Please advise.  Thanks, [employer]

## 2018-09-14 ENCOUNTER — Ambulatory Visit: Payer: Medicaid Other

## 2018-09-15 ENCOUNTER — Other Ambulatory Visit: Payer: Self-pay

## 2018-09-15 ENCOUNTER — Ambulatory Visit: Payer: Medicaid Other

## 2018-09-15 ENCOUNTER — Telehealth: Payer: Self-pay

## 2018-09-15 DIAGNOSIS — M542 Cervicalgia: Secondary | ICD-10-CM

## 2018-09-15 DIAGNOSIS — M6281 Muscle weakness (generalized): Secondary | ICD-10-CM

## 2018-09-15 DIAGNOSIS — M545 Low back pain, unspecified: Secondary | ICD-10-CM

## 2018-09-15 DIAGNOSIS — G8929 Other chronic pain: Secondary | ICD-10-CM

## 2018-09-15 DIAGNOSIS — M546 Pain in thoracic spine: Secondary | ICD-10-CM

## 2018-09-15 NOTE — Telephone Encounter (Signed)
No show for her 1:30 pm scheduled appointment. Called patient to check up on her. Pt said that she was told 2 pm. Offered the 3 pm opening and pt rescheduled the session to 3 pm today. Pt verbalized understanding.
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
## 2018-09-15 NOTE — Patient Instructions (Signed)
Seated hip extension isometrics   Sitting on a chair,    Squeeze your rear end muscles together and press your L foot onto the floor.     Hold for 5 seconds    Repeat 10 times   Perform 3 sets daily.      This is a corrective exercise. Once you no longer have symptoms, you can stop.      Seated shoulder extension isometrics  Sitting on a chair    Press your hands on your thighs to feel your abdominal muscles contract.   Hold for 5 seconds comfortably.   Repeat 10 times.   Perform 3 sets daily.     Medbridge Access Code: [email or web address]   Scapular retraction 10x3 with 5 second holds

## 2018-09-20 ENCOUNTER — Ambulatory Visit: Payer: Medicaid Other

## 2018-09-20 ENCOUNTER — Other Ambulatory Visit: Payer: Self-pay

## 2018-09-20 DIAGNOSIS — M545 Low back pain, unspecified: Secondary | ICD-10-CM

## 2018-09-20 DIAGNOSIS — M542 Cervicalgia: Secondary | ICD-10-CM

## 2018-09-20 DIAGNOSIS — M25511 Pain in right shoulder: Secondary | ICD-10-CM

## 2018-09-20 DIAGNOSIS — M25512 Pain in left shoulder: Secondary | ICD-10-CM

## 2018-09-20 DIAGNOSIS — M546 Pain in thoracic spine: Secondary | ICD-10-CM

## 2018-09-20 DIAGNOSIS — M6281 Muscle weakness (generalized): Secondary | ICD-10-CM

## 2018-09-20 DIAGNOSIS — G8929 Other chronic pain: Secondary | ICD-10-CM
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
## 2018-09-23 ENCOUNTER — Encounter: Payer: Self-pay | Admitting: Family Medicine

## 2018-09-23 ENCOUNTER — Ambulatory Visit (INDEPENDENT_AMBULATORY_CARE_PROVIDER_SITE_OTHER): Payer: Medicaid Other | Admitting: Family Medicine

## 2018-09-23 ENCOUNTER — Other Ambulatory Visit: Payer: Self-pay

## 2018-09-23 VITALS — BP 110/52 | HR 68 | Temp 97.1°F | Resp 18 | Wt 139.0 lb

## 2018-09-23 DIAGNOSIS — N390 Urinary tract infection, site not specified: Secondary | ICD-10-CM

## 2018-09-23 DIAGNOSIS — N61 Mastitis without abscess: Secondary | ICD-10-CM

## 2018-09-23 LAB — POCT URINALYSIS DIPSTICK
Bilirubin, UA: NEGATIVE
Glucose, UA: NEGATIVE
Ketones, UA: NEGATIVE
Leukocytes, UA: NEGATIVE
Nitrite, UA: NEGATIVE
Protein, UA: NEGATIVE
Spec Grav, UA: 1.01 (ref 1.010–1.025)
Urobilinogen, UA: 0.2 E.U./dL
pH, UA: 6 (ref 5.0–8.0)

## 2018-09-23 MED ORDER — CEFDINIR 300 MG PO CAPS: 600.0000 mg | ORAL_CAPSULE | Freq: Every day | ORAL | 0 refills | Status: AC

## 2018-09-23 NOTE — Progress Notes (Signed)
Patient: Erin Richards Female    DOB: Apr 26, 1965   53 y.o.   MRN: [id number] Visit Date: 09/23/2018  Today's Provider: Lelon Huh, MD   Chief Complaint  Patient presents with  . Breast Pain   Subjective:     HPI Breast pain: Patient complains of pain in the right breast. Pain started 2 weeks ago and has worsened. Patient denies any lump, mass or nipple discharge.     Allergies  Allergen Reactions  . Acetaminophen-Codeine Nausea And Vomiting  . Antiseptic Oral Rinse  [Cetylpyridinium Chloride] Other (See Comments)    UNK  . Aspartame Other (See Comments)    Reaction: unknown  . Biaxin [Clarithromycin] Nausea And Vomiting  . Carafate [Sucralfate]     Pt says her "Stomach hurts" when she takes it.    . Chlorhexidine Gluconate Nausea And Vomiting  . Clindamycin/Lincomycin Nausea And Vomiting  . Codeine Itching and Nausea And Vomiting  . Dextromethorphan Hbr Other (See Comments)    Reaction: unknown  . Dilaudid [Hydromorphone Hcl] Nausea And Vomiting  . Doxycycline Nausea And Vomiting  . Fentanyl Nausea And Vomiting  . Fluticasone-Salmeterol Other (See Comments)    Blister in mouth Other reaction(s): Other (See Comments) Blister in mouth  . Germanium Other (See Comments)  . Hydrocodone Nausea And Vomiting  . Ketorolac Other (See Comments)  . Levofloxacin Other (See Comments)    GI upset. GI upset  . Mefenamic Acid Nausea And Vomiting  . Metformin And Related Nausea And Vomiting  . Metronidazole Diarrhea and Nausea And Vomiting  . Morphine And Related Nausea And Vomiting  . Moxifloxacin Swelling  . Nitrofurantoin Nausea And Vomiting and Other (See Comments)  . Nsaids Other (See Comments)    Reaction: unknown  . Oxycodone-Acetaminophen Nausea And Vomiting  . Periguard [Dimethicone] Nausea And Vomiting  . Phenothiazines Nausea And Vomiting  . Pioglitazone Nausea And Vomiting  . Quinidine Nausea And Vomiting  . Quinolones Nausea And Vomiting  . Rumex  Crispus Other (See Comments)  . Tetracyclines & Related Nausea And Vomiting  . Toradol [Ketorolac Tromethamine] Nausea And Vomiting  . Tramadol Nausea And Vomiting  . Tussin [Guaifenesin] Nausea And Vomiting  . Tussionex Pennkinetic Er [Hydrocod Polst-Cpm Polst Er] Nausea And Vomiting  . Buprenorphine Hcl Nausea And Vomiting  . Lincomycin Hcl Nausea And Vomiting  . Oxycodone-Acetaminophen Hives and Nausea And Vomiting    Other reaction(s): Nausea And Vomiting, Unknown  . Phenylalanine Nausea And Vomiting     Current Outpatient Medications:  .  albuterol (PROAIR HFA) 108 (90 Base) MCG/ACT inhaler, Inhale 1-2 puffs into the lungs every 6 (six) hours as needed for shortness of breath., Disp: 8.5 Inhaler, Rfl: 5 .  azelastine (ASTELIN) 0.1 % nasal spray, Place 1 spray into both nostrils 2 (two) times daily., Disp: 30 mL, Rfl: 12 .  baclofen (LIORESAL) 10 MG tablet, Take 1 tablet (10 mg total) by mouth 3 (three) times daily., Disp: 30 each, Rfl: 0 .  cephALEXin (KEFLEX) 500 MG capsule, Take 1 capsule (500 mg total) by mouth 3 (three) times daily., Disp: 21 capsule, Rfl: 0 .  cetirizine (ZYRTEC) 10 MG tablet, Take 1 tablet (10 mg total) by mouth daily., Disp: 30 tablet, Rfl: 11 .  citalopram (CELEXA) 40 MG tablet, Take 1 tablet (40 mg total) by mouth daily., Disp: 30 tablet, Rfl: 2 .  COMBIVENT RESPIMAT 20-100 MCG/ACT AERS respimat, INHALE 1 PUFF INTO THE LUNGS EVERY 4 (FOUR) HOURS AS NEEDED FOR  WHEEZING., Disp: 1 Inhaler, Rfl: 5 .  fexofenadine (ALLEGRA) 60 MG tablet, Take 1 tablet (60 mg total) by mouth 2 (two) times daily., Disp: 60 tablet, Rfl: 4 .  fluticasone (FLONASE) 50 MCG/ACT nasal spray, PLACE 2 SPRAYS INTO BOTH NOSTRILS DAILY AS NEEDED FOR ALLERGIES OR RHINITIS. REPORTED ON 04/03/2015, Disp: 16 g, Rfl: 5 .  gabapentin (NEURONTIN) 300 MG capsule, TAKE 1 CAPSULE (300 MG TOTAL) BY MOUTH DAILY., Disp: 90 capsule, Rfl: 4 .  hyoscyamine (LEVSIN, ANASPAZ) 0.125 MG tablet, TAKE ONE TABLET BY  MOUTH EVERY 6 HOURS AS NEEDED FOR CRAMPING FOR UP TO 10 DAYS, Disp: 30 tablet, Rfl: 5 .  ipratropium (ATROVENT) 0.03 % nasal spray, PLACE 2 SPRAYS INTO THE NOSE DAILY AS NEEDED., Disp: 30 mL, Rfl: 3 .  loperamide (IMODIUM A-D) 2 MG tablet, Take 1 tablet (2 mg total) by mouth 4 (four) times daily as needed for diarrhea or loose stools., Disp: 30 tablet, Rfl: 0 .  montelukast (SINGULAIR) 10 MG tablet, TAKE 1 TABLET (10 MG TOTAL) BY MOUTH AT BEDTIME., Disp: 30 tablet, Rfl: 12 .  naproxen (NAPROSYN) 375 MG tablet, Take 1 tablet (375 mg total) by mouth 2 (two) times daily with a meal., Disp: 30 tablet, Rfl: 1 .  nortriptyline (PAMELOR) 25 MG capsule, TAKE ONE EVERY DAY AT BEDTIME AND ONE DURING THE DAY AS NEEDED FOR MIGRAINES., Disp: 180 capsule, Rfl: 1 .  pantoprazole (PROTONIX) 40 MG tablet, TAKE 1 TABLET BY MOUTH TWICE A DAY, Disp: 60 tablet, Rfl: 5 .  tiotropium (SPIRIVA) 18 MCG inhalation capsule, Place 1 capsule (18 mcg total) into inhaler and inhale daily., Disp: 30 capsule, Rfl: 12  Review of Systems  Constitutional: Negative for appetite change, chills, fatigue and fever.  Respiratory: Negative for chest tightness and shortness of breath.   Cardiovascular: Negative for chest pain and palpitations.  Gastrointestinal: Negative for abdominal pain, nausea and vomiting.  Genitourinary: Negative for difficulty urinating, dyspareunia, dysuria, enuresis, flank pain, frequency, genital sores, hematuria, vaginal bleeding, vaginal discharge and vaginal pain.       Vaginal odor  Musculoskeletal:       Right breast pain  Neurological: Negative for dizziness and weakness.    Social History   Tobacco Use  . Smoking status: Never Smoker  . Smokeless tobacco: Never Used  Substance Use Topics  . Alcohol use: No      Objective:   BP (!) 110/52 (BP Location: Left Arm, Patient Position: Sitting, Cuff Size: Normal)   Pulse 68   Temp (!) 97.1 F (36.2 C) (Temporal)   Resp 18   Wt 139 lb (63 kg)    SpO2 98% Comment: room air  BMI 30.08 kg/m  Vitals:   09/23/18 1438  BP: (!) 110/52  Pulse: 68  Resp: 18  Temp: (!) 97.1 F (36.2 C)  TempSrc: Temporal  SpO2: 98%  Weight: 139 lb (63 kg)     Physical Exam    General Appearance:    Alert, cooperative, no distress, appears stated age  Head:    Normocephalic, without obvious abnormality, atraumatic  Eyes:    PERRL, conjunctiva/corneas clear, EOM's intact, fundi    benign, both eyes  Breast:   Tender right lateral breast, but no masses.    Results for orders placed or performed in visit on 09/23/18  POCT Urinalysis Dipstick  Result Value Ref Range   Color, UA yellow    Clarity, UA clear    Glucose, UA Negative Negative   Bilirubin, UA  negative    Ketones, UA negative    Spec Grav, UA 1.010 1.010 - 1.025   Blood, UA Trace (Hemolyzed)    pH, UA 6.0 5.0 - 8.0   Protein, UA Negative Negative   Urobilinogen, UA 0.2 0.2 or 1.0 E.U./dL   Nitrite, UA negative    Leukocytes, UA Negative Negative   Appearance     Odor         Assessment & Plan     1. Mastitis, right, acute If symptoms resolve with antibiotic may proceed with routing mammogram, otherwise will need diagnostic mammogram.  - cefdinir (OMNICEF) 300 MG capsule; Take 2 capsules (600 mg total) by mouth daily for 7 days.  Dispense: 14 capsule; Refill: 0  2. Urinary tract infection without hematuria, site unspecified  - Urine Culture   The entirety of the information documented in the History of Present Illness, Review of Systems and Physical Exam were personally obtained by me. Portions of this information were initially documented by Meyer Cory, CMA and reviewed by me for thoroughness and accuracy.      Lelon Huh, MD  [facility]

## 2018-09-25 ENCOUNTER — Other Ambulatory Visit: Payer: Self-pay | Admitting: Family Medicine

## 2018-09-25 DIAGNOSIS — M7742 Metatarsalgia, left foot: Secondary | ICD-10-CM

## 2018-09-25 DIAGNOSIS — G8929 Other chronic pain: Secondary | ICD-10-CM

## 2018-09-25 LAB — URINE CULTURE

## 2018-09-28 ENCOUNTER — Other Ambulatory Visit: Payer: Self-pay | Admitting: Family Medicine

## 2018-09-28 DIAGNOSIS — M545 Low back pain, unspecified: Secondary | ICD-10-CM

## 2018-09-28 DIAGNOSIS — M25511 Pain in right shoulder: Secondary | ICD-10-CM

## 2018-09-28 DIAGNOSIS — F321 Major depressive disorder, single episode, moderate: Secondary | ICD-10-CM

## 2018-09-28 DIAGNOSIS — M6281 Muscle weakness (generalized): Secondary | ICD-10-CM

## 2018-09-28 DIAGNOSIS — M546 Pain in thoracic spine: Secondary | ICD-10-CM

## 2018-09-28 DIAGNOSIS — G8929 Other chronic pain: Secondary | ICD-10-CM

## 2018-09-28 DIAGNOSIS — M25512 Pain in left shoulder: Secondary | ICD-10-CM

## 2018-09-28 DIAGNOSIS — M542 Cervicalgia: Secondary | ICD-10-CM
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
## 2018-09-30 ENCOUNTER — Other Ambulatory Visit: Payer: Self-pay | Admitting: Family Medicine

## 2018-09-30 NOTE — Patient Instructions (Signed)
.   Please review the attached list of medications and notify my office if there are any errors.   . Please bring all of your medications to every appointment so we can make sure that our medication list is the same as yours.   . It is especially important to get the annual flu vaccine this year. If you haven't had it already, please go to your pharmacy or call the office as soon as possible to schedule you flu shot.  

## 2018-10-03 ENCOUNTER — Other Ambulatory Visit: Payer: Self-pay | Admitting: Family Medicine

## 2018-10-03 DIAGNOSIS — N644 Mastodynia: Secondary | ICD-10-CM

## 2018-10-06 ENCOUNTER — Ambulatory Visit
Admission: RE | Admit: 2018-10-06 | Discharge: 2018-10-06 | Disposition: A | Discharge status: Home / Self Care | Payer: Medicaid Other | Source: Ambulatory Visit | Attending: Family Medicine | Admitting: Family Medicine

## 2018-10-06 ENCOUNTER — Ambulatory Visit: Payer: Medicaid Other

## 2018-10-06 DIAGNOSIS — N644 Mastodynia: Secondary | ICD-10-CM | POA: Insufficient documentation

## 2018-10-07 ENCOUNTER — Other Ambulatory Visit: Payer: Self-pay | Admitting: Family Medicine

## 2018-10-07 ENCOUNTER — Telehealth: Payer: Self-pay

## 2018-10-07 DIAGNOSIS — R928 Other abnormal and inconclusive findings on diagnostic imaging of breast: Secondary | ICD-10-CM

## 2018-10-07 DIAGNOSIS — R921 Mammographic calcification found on diagnostic imaging of breast: Secondary | ICD-10-CM

## 2018-10-07 NOTE — Telephone Encounter (Signed)
Patient states that she had a mammogram and ultrasound done yesterday. She will need a biopsy done and is very concerned. She wants to know if the referral was received and sent back.

## 2018-10-07 NOTE — Telephone Encounter (Signed)
I see that orders were placed today by a Dollene Cleveland. I talked to Burman Freestone. and she advised me that [facility] puts their orders in and calls the patient whenever a patient has to have something like this. I called and spoke with patient advising her that the biopsy orders were placed by [facility]. I gave patient a contact number [phone] to follow up with [facility] about biopsy appointment.  Patient says she is very scared and is concerned about the results.

## 2018-10-13 ENCOUNTER — Other Ambulatory Visit: Payer: Self-pay

## 2018-10-13 ENCOUNTER — Ambulatory Visit: Payer: Medicaid Other | Attending: Family Medicine

## 2018-10-13 DIAGNOSIS — M546 Pain in thoracic spine: Secondary | ICD-10-CM

## 2018-10-13 DIAGNOSIS — M25512 Pain in left shoulder: Secondary | ICD-10-CM | POA: Insufficient documentation

## 2018-10-13 DIAGNOSIS — G8929 Other chronic pain: Secondary | ICD-10-CM | POA: Diagnosis present

## 2018-10-13 DIAGNOSIS — M545 Low back pain, unspecified: Secondary | ICD-10-CM

## 2018-10-13 DIAGNOSIS — M542 Cervicalgia: Secondary | ICD-10-CM | POA: Insufficient documentation

## 2018-10-13 DIAGNOSIS — M6281 Muscle weakness (generalized): Secondary | ICD-10-CM | POA: Insufficient documentation

## 2018-10-13 DIAGNOSIS — M25511 Pain in right shoulder: Secondary | ICD-10-CM | POA: Insufficient documentation
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
## 2018-10-13 NOTE — Patient Instructions (Signed)
Access Code: [email or web address]  URL: [email or web address]  Date: 10/13/2018  Prepared by: Joneen Boers   Exercises Seated Scapular Retraction - 10 reps - 3 sets - 5 seconds hold - 1x daily - 7x weekly Seated Transversus Abdominis Bracing - 10 reps - 3 sets - 5 seconds hold - 1x daily - 7x weekly Scapular Retraction with Resistance - 10 reps - 3 sets - 5 seconds hold - 1x daily - 7x weekly Hip Extension with Leg Bent - 10 reps - 2 sets - 1x daily - 7x weekly Sidelying Hip Abduction - 10 reps - 2 sets - 1x daily - 4x weekly Standing Shoulder External Rotation with Resistance - 10 reps - 2 sets - 1x daily - 7x weekly Sidestepping - 2 reps - 1 sets - 2x daily - 7x weekly

## 2018-10-14 ENCOUNTER — Ambulatory Visit
Admission: RE | Admit: 2018-10-14 | Discharge: 2018-10-14 | Disposition: A | Discharge status: Home / Self Care | Payer: Medicaid Other | Source: Ambulatory Visit | Attending: Family Medicine | Admitting: Family Medicine

## 2018-10-14 DIAGNOSIS — R921 Mammographic calcification found on diagnostic imaging of breast: Secondary | ICD-10-CM

## 2018-10-14 DIAGNOSIS — R928 Other abnormal and inconclusive findings on diagnostic imaging of breast: Secondary | ICD-10-CM

## 2018-10-14 HISTORY — PX: BREAST BIOPSY: SHX20

## 2018-10-17 ENCOUNTER — Encounter: Payer: Self-pay | Admitting: Family Medicine

## 2018-10-17 DIAGNOSIS — Z9889 Other specified postprocedural states: Secondary | ICD-10-CM | POA: Insufficient documentation

## 2018-10-17 LAB — SURGICAL PATHOLOGY

## 2018-10-18 ENCOUNTER — Ambulatory Visit: Payer: Medicaid Other

## 2018-10-18 ENCOUNTER — Other Ambulatory Visit: Payer: Self-pay

## 2018-10-18 DIAGNOSIS — G8929 Other chronic pain: Secondary | ICD-10-CM

## 2018-10-18 DIAGNOSIS — M6281 Muscle weakness (generalized): Secondary | ICD-10-CM

## 2018-10-18 DIAGNOSIS — M546 Pain in thoracic spine: Secondary | ICD-10-CM

## 2018-10-18 DIAGNOSIS — M542 Cervicalgia: Secondary | ICD-10-CM
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
## 2018-10-20 ENCOUNTER — Other Ambulatory Visit: Payer: Self-pay

## 2018-10-20 ENCOUNTER — Ambulatory Visit: Payer: Medicaid Other

## 2018-10-20 DIAGNOSIS — M546 Pain in thoracic spine: Secondary | ICD-10-CM | POA: Diagnosis not present

## 2018-10-20 DIAGNOSIS — G8929 Other chronic pain: Secondary | ICD-10-CM

## 2018-10-20 DIAGNOSIS — M545 Low back pain, unspecified: Secondary | ICD-10-CM

## 2018-10-20 DIAGNOSIS — M6281 Muscle weakness (generalized): Secondary | ICD-10-CM

## 2018-10-20 DIAGNOSIS — M542 Cervicalgia: Secondary | ICD-10-CM

## 2018-10-20 NOTE — Patient Instructions (Signed)
Access Code: [email or web address]  URL: [email or web address]  Date: 10/20/2018  Prepared by: Joneen Boers   Exercises Seated Scapular Retraction - 10 reps - 3 sets - 5 seconds hold - 1x daily - 7x weekly Seated Transversus Abdominis Bracing - 10 reps - 3 sets - 5 seconds hold - 1x daily - 7x weekly Scapular Retraction with Resistance - 10 reps - 3 sets - 1x daily - 7x weekly Hip Extension with Leg Bent - 10 reps - 2 sets - 1x daily - 7x weekly Sidelying Hip Abduction - 10 reps - 2 sets - 1x daily - 4x weekly Standing Shoulder External Rotation with Resistance - 10 reps - 2 sets - 1x daily - 7x weekly Sidestepping - 2 reps - 1 sets - 2x daily - 7x weekly
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
## 2018-10-25 ENCOUNTER — Ambulatory Visit: Payer: Medicaid Other

## 2018-10-25 ENCOUNTER — Other Ambulatory Visit: Payer: Self-pay

## 2018-10-25 DIAGNOSIS — M546 Pain in thoracic spine: Secondary | ICD-10-CM | POA: Diagnosis not present

## 2018-10-25 DIAGNOSIS — M25512 Pain in left shoulder: Secondary | ICD-10-CM

## 2018-10-25 DIAGNOSIS — G8929 Other chronic pain: Secondary | ICD-10-CM

## 2018-10-25 DIAGNOSIS — M542 Cervicalgia: Secondary | ICD-10-CM

## 2018-10-25 DIAGNOSIS — M6281 Muscle weakness (generalized): Secondary | ICD-10-CM

## 2018-10-25 DIAGNOSIS — M25511 Pain in right shoulder: Secondary | ICD-10-CM
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
## 2018-10-25 NOTE — Patient Instructions (Signed)
Access Code: [email or web address]  URL: [email or web address]  Date: 10/25/2018  Prepared by: Joneen Boers   Exercises Seated Scapular Retraction - 10 reps - 3 sets - 5 seconds hold - 1x daily - 7x weekly Seated Transversus Abdominis Bracing - 10 reps - 3 sets - 5 seconds hold - 1x daily - 7x weekly Scapular Retraction with Resistance - 10 reps - 3 sets - 1x daily - 7x weekly Hip Extension with Leg Bent - 10 reps - 3 sets - 1x daily - 7x weekly Sidelying Hip Abduction - 10 reps - 3 sets - 1x daily - 4x weekly Standing Shoulder External Rotation with Resistance - 10 reps - 3 sets - 1x daily - 7x weekly Sidestepping - 3 reps - 1 sets - 2x daily - 7x weekly

## 2018-10-27 ENCOUNTER — Ambulatory Visit: Payer: Medicaid Other

## 2018-10-27 ENCOUNTER — Other Ambulatory Visit: Payer: Self-pay

## 2018-10-27 DIAGNOSIS — M546 Pain in thoracic spine: Secondary | ICD-10-CM

## 2018-10-27 DIAGNOSIS — G8929 Other chronic pain: Secondary | ICD-10-CM

## 2018-10-27 DIAGNOSIS — M6281 Muscle weakness (generalized): Secondary | ICD-10-CM

## 2018-10-27 DIAGNOSIS — M25512 Pain in left shoulder: Secondary | ICD-10-CM

## 2018-10-27 DIAGNOSIS — M25511 Pain in right shoulder: Secondary | ICD-10-CM

## 2018-10-27 DIAGNOSIS — M542 Cervicalgia: Secondary | ICD-10-CM
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
## 2018-10-28 ENCOUNTER — Encounter: Payer: Self-pay | Admitting: Family Medicine

## 2018-10-28 ENCOUNTER — Ambulatory Visit: Payer: Medicaid Other | Admitting: Family Medicine

## 2018-10-28 VITALS — BP 108/60 | HR 71 | Temp 97.1°F | Resp 16 | Wt 142.0 lb

## 2018-10-28 DIAGNOSIS — F5102 Adjustment insomnia: Secondary | ICD-10-CM

## 2018-10-28 DIAGNOSIS — F321 Major depressive disorder, single episode, moderate: Secondary | ICD-10-CM

## 2018-10-28 DIAGNOSIS — G4700 Insomnia, unspecified: Secondary | ICD-10-CM | POA: Insufficient documentation

## 2018-10-28 MED ORDER — TRAZODONE HCL 100 MG PO TABS: 100.0000 mg | ORAL_TABLET | Freq: Every day | ORAL | 2 refills | Status: DC

## 2018-10-28 MED ORDER — CITALOPRAM HYDROBROMIDE 40 MG PO TABS: 20.0000 mg | ORAL_TABLET | Freq: Every day | ORAL | Status: DC

## 2018-10-28 NOTE — Progress Notes (Signed)
Patient: Erin Richards Female    DOB: Nov 10, 1965   53 y.o.   MRN: [id number] Visit Date: 10/28/2018  Today's Provider: Lelon Huh, MD   Chief Complaint  Patient presents with  . Follow-up   Subjective:     HPI  Follow up for Depression:  Current treatment includes Citalopram 20mg  daily.   She reports good compliance with treatment. Since her last visit on 09-06-2018 she tried going up to 40mg  citalopram which she states made her shake all the team, so she has since gone back down to 20mg  (1/2 of a 40) daily.  She feels that condition is Worse. Patient states that this does is not effective in controlling her symptoms. She has an appointment with psychiatry on 11/24/2018.  She is not having side effects.   ------------------------------------------------------------------------------------  Allergies  Allergen Reactions  . Acetaminophen-Codeine Nausea And Vomiting  . Antiseptic Oral Rinse  [Cetylpyridinium Chloride] Other (See Comments)    UNK  . Aspartame Other (See Comments)    Reaction: unknown  . Biaxin [Clarithromycin] Nausea And Vomiting  . Carafate [Sucralfate]     Pt says her "Stomach hurts" when she takes it.    . Chlorhexidine Gluconate Nausea And Vomiting  . Clindamycin/Lincomycin Nausea And Vomiting  . Codeine Itching and Nausea And Vomiting  . Dextromethorphan Hbr Other (See Comments)    Reaction: unknown  . Dilaudid [Hydromorphone Hcl] Nausea And Vomiting  . Doxycycline Nausea And Vomiting  . Fentanyl Nausea And Vomiting  . Fluticasone-Salmeterol Other (See Comments)    Blister in mouth Other reaction(s): Other (See Comments) Blister in mouth  . Germanium Other (See Comments)  . Hydrocodone Nausea And Vomiting  . Ketorolac Other (See Comments)  . Levofloxacin Other (See Comments)    GI upset. GI upset  . Mefenamic Acid Nausea And Vomiting  . Metformin And Related Nausea And Vomiting  . Metronidazole Diarrhea and Nausea And Vomiting  .  Morphine And Related Nausea And Vomiting  . Moxifloxacin Swelling  . Nitrofurantoin Nausea And Vomiting and Other (See Comments)  . Nsaids Other (See Comments)    Reaction: unknown  . Oxycodone-Acetaminophen Nausea And Vomiting  . Periguard [Dimethicone] Nausea And Vomiting  . Phenothiazines Nausea And Vomiting  . Pioglitazone Nausea And Vomiting  . Quinidine Nausea And Vomiting  . Quinolones Nausea And Vomiting  . Rumex Crispus Other (See Comments)  . Tetracyclines & Related Nausea And Vomiting  . Toradol [Ketorolac Tromethamine] Nausea And Vomiting  . Tramadol Nausea And Vomiting  . Tussin [Guaifenesin] Nausea And Vomiting  . Tussionex Pennkinetic Er [Hydrocod Polst-Cpm Polst Er] Nausea And Vomiting  . Buprenorphine Hcl Nausea And Vomiting  . Lincomycin Hcl Nausea And Vomiting  . Oxycodone-Acetaminophen Hives and Nausea And Vomiting    Other reaction(s): Nausea And Vomiting, Unknown  . Phenylalanine Nausea And Vomiting     Current Outpatient Medications:  .  albuterol (PROAIR HFA) 108 (90 Base) MCG/ACT inhaler, Inhale 1-2 puffs into the lungs every 6 (six) hours as needed for shortness of breath., Disp: 8.5 Inhaler, Rfl: 5 .  azelastine (ASTELIN) 0.1 % nasal spray, Place 1 spray into both nostrils 2 (two) times daily., Disp: 30 mL, Rfl: 12 .  baclofen (LIORESAL) 10 MG tablet, Take 1 tablet (10 mg total) by mouth 3 (three) times daily., Disp: 30 each, Rfl: 0 .  cephALEXin (KEFLEX) 500 MG capsule, Take 1 capsule (500 mg total) by mouth 3 (three) times daily., Disp: 21 capsule, Rfl: 0 .  cetirizine (ZYRTEC) 10 MG tablet, Take 1 tablet (10 mg total) by mouth daily., Disp: 30 tablet, Rfl: 11 .  citalopram (CELEXA) 40 MG tablet, TAKE 1 TABLET BY MOUTH EVERY DAY, Disp: 90 tablet, Rfl: 4 .  COMBIVENT RESPIMAT 20-100 MCG/ACT AERS respimat, INHALE 1 PUFF INTO THE LUNGS EVERY 4 (FOUR) HOURS AS NEEDED FOR WHEEZING., Disp: 1 Inhaler, Rfl: 5 .  fexofenadine (ALLEGRA) 60 MG tablet, Take 1 tablet  (60 mg total) by mouth 2 (two) times daily., Disp: 60 tablet, Rfl: 4 .  fluticasone (FLONASE) 50 MCG/ACT nasal spray, PLACE 2 SPRAYS INTO BOTH NOSTRILS DAILY AS NEEDED FOR ALLERGIES OR RHINITIS. REPORTED ON 04/03/2015, Disp: 16 g, Rfl: 5 .  gabapentin (NEURONTIN) 300 MG capsule, TAKE 1 CAPSULE (300 MG TOTAL) BY MOUTH DAILY., Disp: 90 capsule, Rfl: 4 .  hyoscyamine (LEVSIN, ANASPAZ) 0.125 MG tablet, TAKE ONE TABLET BY MOUTH EVERY 6 HOURS AS NEEDED FOR CRAMPING FOR UP TO 10 DAYS, Disp: 30 tablet, Rfl: 5 .  ipratropium (ATROVENT) 0.03 % nasal spray, PLACE 2 SPRAYS INTO THE NOSE DAILY AS NEEDED., Disp: 30 mL, Rfl: 4 .  loperamide (IMODIUM A-D) 2 MG tablet, Take 1 tablet (2 mg total) by mouth 4 (four) times daily as needed for diarrhea or loose stools., Disp: 30 tablet, Rfl: 0 .  montelukast (SINGULAIR) 10 MG tablet, TAKE 1 TABLET (10 MG TOTAL) BY MOUTH AT BEDTIME., Disp: 30 tablet, Rfl: 12 .  naproxen (NAPROSYN) 375 MG tablet, TAKE 1 TABLET (375 MG TOTAL) BY MOUTH 2 (TWO) TIMES DAILY WITH A MEAL., Disp: 30 tablet, Rfl: 5 .  nortriptyline (PAMELOR) 25 MG capsule, TAKE ONE EVERY DAY AT BEDTIME AND ONE DURING THE DAY AS NEEDED FOR MIGRAINES., Disp: 180 capsule, Rfl: 1 .  pantoprazole (PROTONIX) 40 MG tablet, TAKE 1 TABLET BY MOUTH TWICE A DAY, Disp: 60 tablet, Rfl: 5 .  tiotropium (SPIRIVA) 18 MCG inhalation capsule, Place 1 capsule (18 mcg total) into inhaler and inhale daily., Disp: 30 capsule, Rfl: 12  Review of Systems  Constitutional: Negative for appetite change, chills, fatigue and fever.  Respiratory: Negative for chest tightness and shortness of breath.   Cardiovascular: Negative for chest pain and palpitations.  Gastrointestinal: Negative for abdominal pain, nausea and vomiting.  Neurological: Negative for dizziness and weakness.  Psychiatric/Behavioral: Positive for decreased concentration, dysphoric mood and sleep disturbance. Negative for agitation, behavioral problems and confusion. The  patient is nervous/anxious.     Social History   Tobacco Use  . Smoking status: Never Smoker  . Smokeless tobacco: Never Used  Substance Use Topics  . Alcohol use: No      Objective:   BP 108/60 (BP Location: Left Arm, Patient Position: Sitting, Cuff Size: Normal)   Pulse 71   Temp (!) 97.1 F (36.2 C) (Temporal)   Resp 16   Wt 142 lb (64.4 kg)   SpO2 97% Comment: room air  BMI 30.73 kg/m  Vitals:   10/28/18 1337  BP: 108/60  Pulse: 71  Resp: 16  Temp: (!) 97.1 F (36.2 C)  TempSrc: Temporal  SpO2: 97%  Weight: 142 lb (64.4 kg)  Body mass index is 30.73 kg/m.   Physical Exam  General appearance: alert, well developed, well nourished, cooperative and in no distress Head: Normocephalic, without obvious abnormality, atraumatic Respiratory: Respirations even and unlabored, normal respiratory rate Extremities: All extremities are intact.  Skin: Skin color, texture, turgor normal. No rashes seen  Psych: Appropriate mood and affect. Neurologic: Mental status: Alert, oriented  to person, place, and time, thought content appropriate.      Assessment & Plan    1. Major depressive disorder, single episode, moderate (HCC) Continue 20mg  citalopram since she did not tolerate 40mg . Add trazodone as below.  - citalopram (CELEXA) 40 MG tablet; Take 0.5 tablets (20 mg total) by mouth daily.  2. Adjustment insomnia  - traZODone (DESYREL) 100 MG tablet; Take 1 tablet (100 mg total) by mouth at bedtime.  Dispense: 30 tablet; Refill: 2  Call if any problems with medications, otherwise follow up with psychiatry October 22nd as scheduled.   The entirety of the information documented in the History of Present Illness, Review of Systems and Physical Exam were personally obtained by me. Portions of this information were initially documented by Meyer Cory, CMA and reviewed by me for thoroughness and accuracy.      Lelon Huh, MD  [facility]

## 2018-10-28 NOTE — Patient Instructions (Signed)
.   Please review the attached list of medications and notify my office if there are any errors.   . Please bring all of your medications to every appointment so we can make sure that our medication list is the same as yours.   . It is especially important to get the annual flu vaccine this year. If you haven't had it already, please go to your pharmacy or call the office as soon as possible to schedule you flu shot.  

## 2018-11-01 ENCOUNTER — Ambulatory Visit: Payer: Medicaid Other

## 2018-11-01 ENCOUNTER — Other Ambulatory Visit: Payer: Self-pay

## 2018-11-01 DIAGNOSIS — M546 Pain in thoracic spine: Secondary | ICD-10-CM | POA: Diagnosis not present

## 2018-11-01 DIAGNOSIS — M6281 Muscle weakness (generalized): Secondary | ICD-10-CM

## 2018-11-01 DIAGNOSIS — M545 Low back pain, unspecified: Secondary | ICD-10-CM

## 2018-11-01 DIAGNOSIS — G8929 Other chronic pain: Secondary | ICD-10-CM

## 2018-11-01 DIAGNOSIS — M542 Cervicalgia: Secondary | ICD-10-CM
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
## 2018-11-03 ENCOUNTER — Ambulatory Visit: Payer: Medicaid Other | Attending: Family Medicine

## 2018-11-03 ENCOUNTER — Other Ambulatory Visit: Payer: Self-pay

## 2018-11-03 DIAGNOSIS — G8929 Other chronic pain: Secondary | ICD-10-CM | POA: Insufficient documentation

## 2018-11-03 DIAGNOSIS — M545 Low back pain: Secondary | ICD-10-CM | POA: Insufficient documentation

## 2018-11-03 DIAGNOSIS — M546 Pain in thoracic spine: Secondary | ICD-10-CM | POA: Diagnosis present

## 2018-11-03 DIAGNOSIS — M542 Cervicalgia: Secondary | ICD-10-CM | POA: Diagnosis present

## 2018-11-03 DIAGNOSIS — M6281 Muscle weakness (generalized): Secondary | ICD-10-CM | POA: Insufficient documentation
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
## 2018-11-06 ENCOUNTER — Other Ambulatory Visit: Payer: Self-pay

## 2018-11-06 ENCOUNTER — Emergency Department: Payer: Medicaid Other

## 2018-11-06 ENCOUNTER — Emergency Department
Admission: EM | Admit: 2018-11-06 | Discharge: 2018-11-06 | Disposition: A | Discharge status: Home / Self Care | Payer: Medicaid Other | Attending: Student | Admitting: Student

## 2018-11-06 DIAGNOSIS — R0602 Shortness of breath: Secondary | ICD-10-CM | POA: Insufficient documentation

## 2018-11-06 DIAGNOSIS — Z20828 Contact with and (suspected) exposure to other viral communicable diseases: Secondary | ICD-10-CM | POA: Diagnosis not present

## 2018-11-06 DIAGNOSIS — Z79899 Other long term (current) drug therapy: Secondary | ICD-10-CM | POA: Insufficient documentation

## 2018-11-06 LAB — CBC WITH DIFFERENTIAL/PLATELET
Abs Immature Granulocytes: 0.01 10*3/uL (ref 0.00–0.07)
Basophils Absolute: 0 10*3/uL (ref 0.0–0.1)
Basophils Relative: 1 %
Eosinophils Absolute: 0.1 10*3/uL (ref 0.0–0.5)
Eosinophils Relative: 2 %
HCT: 37.7 % (ref 36.0–46.0)
Hemoglobin: 12.2 g/dL (ref 12.0–15.0)
Immature Granulocytes: 0 %
Lymphocytes Relative: 23 %
Lymphs Abs: 1.4 10*3/uL (ref 0.7–4.0)
MCH: 26.5 pg (ref 26.0–34.0)
MCHC: 32.4 g/dL (ref 30.0–36.0)
MCV: 82 fL (ref 80.0–100.0)
Monocytes Absolute: 0.5 10*3/uL (ref 0.1–1.0)
Monocytes Relative: 8 %
Neutro Abs: 3.9 10*3/uL (ref 1.7–7.7)
Neutrophils Relative %: 66 %
Platelets: 186 10*3/uL (ref 150–400)
RBC: 4.6 MIL/uL (ref 3.87–5.11)
RDW: 13.8 % (ref 11.5–15.5)
WBC: 5.8 10*3/uL (ref 4.0–10.5)
nRBC: 0 % (ref 0.0–0.2)

## 2018-11-06 LAB — COMPREHENSIVE METABOLIC PANEL
ALT: 11 U/L (ref 0–44)
AST: 23 U/L (ref 15–41)
Albumin: 3.9 g/dL (ref 3.5–5.0)
Alkaline Phosphatase: 75 U/L (ref 38–126)
Anion gap: 9 (ref 5–15)
BUN: 6 mg/dL (ref 6–20)
CO2: 24 mmol/L (ref 22–32)
Calcium: 9.2 mg/dL (ref 8.9–10.3)
Chloride: 109 mmol/L (ref 98–111)
Creatinine, Ser: 0.87 mg/dL (ref 0.44–1.00)
GFR calc Af Amer: >60 mL/min (ref >60)
GFR calc non Af Amer: >60 mL/min (ref >60)
Glucose, Bld: 99 mg/dL (ref 70–99)
Potassium: 3.5 mmol/L (ref 3.5–5.1)
Sodium: 142 mmol/L (ref 135–145)
Total Bilirubin: 0.8 mg/dL (ref 0.3–1.2)
Total Protein: 6.9 g/dL (ref 6.5–8.1)

## 2018-11-06 LAB — INFLUENZA PANEL BY PCR (TYPE A & B)
Influenza A By PCR: NEGATIVE
Influenza B By PCR: NEGATIVE

## 2018-11-06 LAB — SARS CORONAVIRUS 2 BY RT PCR (HOSPITAL ORDER, PERFORMED IN ~~LOC~~ HOSPITAL LAB): SARS Coronavirus 2: NEGATIVE

## 2018-11-06 LAB — TROPONIN I (HIGH SENSITIVITY): Troponin I (High Sensitivity): 2 ng/L (ref <18)

## 2018-11-06 LAB — BRAIN NATRIURETIC PEPTIDE: B Natriuretic Peptide: 95 pg/mL (ref 0.0–100.0)

## 2018-11-06 MED ORDER — SODIUM CHLORIDE 0.9 % IV BOLUS
500.0000 mL | Freq: Once | INTRAVENOUS | Status: AC
  Administered 2018-11-06: 500 mL via INTRAVENOUS

## 2018-11-06 MED ORDER — AZITHROMYCIN 250 MG PO TABS: ORAL_TABLET | ORAL | 0 refills | Status: AC

## 2018-11-06 NOTE — ED Triage Notes (Signed)
Pt arrives via EMS from home- was tested for Covid yesterday ands still pending results- pt is having SHOB, n/v/d, and cough since Friday- EMS gave 4 of zofran

## 2018-11-06 NOTE — ED Notes (Signed)
Report given to Noel, RN 

## 2018-11-06 NOTE — Discharge Instructions (Addendum)
Thank you for letting us take care of you in the emergency department today.   Please continue to take any regular, prescribed medications.   New medications we have prescribed:  - Azithromycin, if you do not feel like your cough is getting better in 2-3 days, you can fill this and start the prescription  Please follow up with: - Your primary care doctor to review your ER visit and follow up on your symptoms.   Please return to the ER for any new or worsening symptoms.
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
## 2018-11-06 NOTE — ED Notes (Signed)
X-ray at bedside

## 2018-11-06 NOTE — ED Notes (Signed)
Colma and son

## 2018-11-06 NOTE — ED Notes (Signed)
Peripheral IV discontinued. Catheter intact. No signs of infiltration or redness. Gauze applied to IV site.   Discharge instructions reviewed with patient. Questions fielded by this RN. Patient verbalizes understanding of instructions. Patient discharged home in stable condition per [facility]. No acute distress noted at time of discharge.

## 2018-11-06 NOTE — ED Notes (Addendum)
Pt ambulatory to toilet, begin trial off O2; pt initially not talking then talking a lot, dry cough noted, bed adjusted, snacks given, tv remote and pillow procured as requested and additional warm blankets given
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
## 2018-11-08 ENCOUNTER — Ambulatory Visit: Payer: Medicaid Other

## 2018-11-15 ENCOUNTER — Ambulatory Visit: Payer: Medicaid Other

## 2018-11-19 ENCOUNTER — Other Ambulatory Visit: Payer: Self-pay | Admitting: Family Medicine

## 2018-11-19 DIAGNOSIS — F5102 Adjustment insomnia: Secondary | ICD-10-CM

## 2018-11-24 ENCOUNTER — Ambulatory Visit (INDEPENDENT_AMBULATORY_CARE_PROVIDER_SITE_OTHER): Payer: Medicaid Other | Admitting: Psychiatry

## 2018-11-24 ENCOUNTER — Other Ambulatory Visit: Payer: Self-pay

## 2018-11-24 ENCOUNTER — Telehealth: Payer: Self-pay | Admitting: Family Medicine

## 2018-11-24 ENCOUNTER — Encounter: Payer: Self-pay | Admitting: Psychiatry

## 2018-11-24 DIAGNOSIS — F330 Major depressive disorder, recurrent, mild: Secondary | ICD-10-CM

## 2018-11-24 DIAGNOSIS — Z9189 Other specified personal risk factors, not elsewhere classified: Secondary | ICD-10-CM

## 2018-11-24 MED ORDER — MIRTAZAPINE 15 MG PO TABS: 15.0000 mg | ORAL_TABLET | Freq: Every day | ORAL | 1 refills | Status: DC

## 2018-11-24 NOTE — Telephone Encounter (Signed)
Pt called saying that she went to the counselor that Dr. Caryn Section sent her to and she changed her medication her medications and told her she would need to see someone for an EKG with in two weeks.  She also told her that she would need to be referred to another counselor because they are not taking new patients   CB#  [phone]  Con Memos
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
## 2018-11-25 NOTE — Telephone Encounter (Signed)
That's fine, can just schedule EKG. Use high risk medication for diagnosis.   Thanks.

## 2018-11-25 NOTE — Telephone Encounter (Signed)
Should we just go ahead and schedule patient to come in for an EKG? Please advise. She does not know why this was needed.

## 2018-11-25 NOTE — Telephone Encounter (Signed)
What medication was it?

## 2018-11-25 NOTE — Telephone Encounter (Signed)
Appointment for EKG scheduled for 12/12/2018 at 9:40am. Patient then mentiones that Dr. Shea Evans advised her to have her PCP refer her to counseling therapy. Patient says that Dr. Shea Evans is only doing the medications, and she needs to also see a counselor. She was given a number to call, but that counselor is not taking new patients at this time. Patient says she needs a referral to counseling therapy asap. Please advise if ok to refer patient.

## 2018-11-25 NOTE — Telephone Encounter (Signed)
Per behavioral health virtual visit on 11/24/2018 changes made include  Discontinuing citalopram and Trazodone, and starting Mirtazapine 15 mg p.o. nightly. Dr. Shea Evans discussed with patient the risk of serotonin syndrome and combining medications like citalopram, trazodone, nortriptyline, mirtazapine. It was noted that patient is at risk for QTC prolongation-given her history of cardiac problems. Dr. Shea Evans documented that  patient will benefit from an EKG which  she should get from her primary care provider.  Should I just schedule an office visit for EKG? Patient has a follow up appointment scheduled with Dr. Shea Evans on 12/16/2018.

## 2018-12-04 ENCOUNTER — Emergency Department: Payer: Medicaid Other

## 2018-12-04 ENCOUNTER — Emergency Department
Admission: EM | Admit: 2018-12-04 | Discharge: 2018-12-04 | Disposition: A | Discharge status: Home / Self Care | Payer: Medicaid Other | Attending: Emergency Medicine | Admitting: Emergency Medicine

## 2018-12-04 ENCOUNTER — Other Ambulatory Visit: Payer: Self-pay

## 2018-12-04 ENCOUNTER — Encounter: Payer: Self-pay | Admitting: Emergency Medicine

## 2018-12-04 DIAGNOSIS — Z79899 Other long term (current) drug therapy: Secondary | ICD-10-CM | POA: Insufficient documentation

## 2018-12-04 DIAGNOSIS — R05 Cough: Secondary | ICD-10-CM | POA: Insufficient documentation

## 2018-12-04 DIAGNOSIS — U071 COVID-19: Secondary | ICD-10-CM | POA: Insufficient documentation

## 2018-12-04 DIAGNOSIS — R109 Unspecified abdominal pain: Secondary | ICD-10-CM | POA: Diagnosis present

## 2018-12-04 DIAGNOSIS — B349 Viral infection, unspecified: Secondary | ICD-10-CM

## 2018-12-04 DIAGNOSIS — R1110 Vomiting, unspecified: Secondary | ICD-10-CM | POA: Insufficient documentation

## 2018-12-04 LAB — URINALYSIS, COMPLETE (UACMP) WITH MICROSCOPIC
Bacteria, UA: NONE SEEN
Bilirubin Urine: NEGATIVE
Glucose, UA: NEGATIVE mg/dL
Hgb urine dipstick: NEGATIVE
Ketones, ur: NEGATIVE mg/dL
Leukocytes,Ua: NEGATIVE
Nitrite: NEGATIVE
Protein, ur: 30 mg/dL — AB
Specific Gravity, Urine: 1.03 (ref 1.005–1.030)
pH: 5 (ref 5.0–8.0)

## 2018-12-04 LAB — COMPREHENSIVE METABOLIC PANEL
ALT: 18 U/L (ref 0–44)
AST: 31 U/L (ref 15–41)
Albumin: 4.3 g/dL (ref 3.5–5.0)
Alkaline Phosphatase: 71 U/L (ref 38–126)
Anion gap: 10 (ref 5–15)
BUN: 10 mg/dL (ref 6–20)
CO2: 23 mmol/L (ref 22–32)
Calcium: 9.2 mg/dL (ref 8.9–10.3)
Chloride: 105 mmol/L (ref 98–111)
Creatinine, Ser: 0.76 mg/dL (ref 0.44–1.00)
GFR calc Af Amer: >60 mL/min (ref >60)
GFR calc non Af Amer: >60 mL/min (ref >60)
Glucose, Bld: 103 mg/dL — ABNORMAL HIGH (ref 70–99)
Potassium: 3.9 mmol/L (ref 3.5–5.1)
Sodium: 138 mmol/L (ref 135–145)
Total Bilirubin: 0.9 mg/dL (ref 0.3–1.2)
Total Protein: 7.3 g/dL (ref 6.5–8.1)

## 2018-12-04 LAB — CBC
HCT: 38.9 % (ref 36.0–46.0)
Hemoglobin: 12.7 g/dL (ref 12.0–15.0)
MCH: 26.7 pg (ref 26.0–34.0)
MCHC: 32.6 g/dL (ref 30.0–36.0)
MCV: 81.7 fL (ref 80.0–100.0)
Platelets: 128 10*3/uL — ABNORMAL LOW (ref 150–400)
RBC: 4.76 MIL/uL (ref 3.87–5.11)
RDW: 14 % (ref 11.5–15.5)
WBC: 3.8 10*3/uL — ABNORMAL LOW (ref 4.0–10.5)
nRBC: 0 % (ref 0.0–0.2)

## 2018-12-04 LAB — LIPASE, BLOOD: Lipase: 22 U/L (ref 11–51)

## 2018-12-04 MED ORDER — PREDNISONE 10 MG PO TABS: 50.0000 mg | ORAL_TABLET | Freq: Every day | ORAL | 0 refills | Status: DC

## 2018-12-04 MED ORDER — SODIUM CHLORIDE 0.9 % IV BOLUS
500.0000 mL | Freq: Once | INTRAVENOUS | Status: AC
  Administered 2018-12-04: 500 mL via INTRAVENOUS

## 2018-12-04 MED ORDER — SODIUM CHLORIDE 0.9% FLUSH
3.0000 mL | Freq: Once | INTRAVENOUS | Status: AC
  Administered 2018-12-04: 3 mL via INTRAVENOUS

## 2018-12-04 MED ORDER — ONDANSETRON 4 MG PO TBDP
4.0000 mg | ORAL_TABLET | Freq: Once | ORAL | Status: AC
  Administered 2018-12-04: 4 mg via ORAL
  Filled 2018-12-04: qty 1

## 2018-12-04 NOTE — ED Triage Notes (Signed)
Pt here with c/o abdominal pain, vomiting for the past few days, sweating and "feels awful." Unsure if she's been in contact with covid. NAD.

## 2018-12-04 NOTE — ED Notes (Signed)
Pt states nausea medicine has helped.

## 2018-12-04 NOTE — ED Notes (Signed)
Pt states she drove herself and feels fine driving herself home. Pt with steady gait. Stated she will walk from bed to lobby.

## 2018-12-04 NOTE — ED Notes (Signed)
First Nurse Note: Pt to stat desk asking if she can have something to drink. Pt informed that she should not have anything to eat or drink until seen by MD. Pt asked how long it would be before she was informed. RN informed her that I am not able to give her a time. Pt asked if she was still next, RN informed her that she is the longest wait.
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
## 2018-12-04 NOTE — ED Notes (Signed)
Lab called this RN to say pt didn't provide enough of a urine sample, need to re-collect.

## 2018-12-04 NOTE — ED Notes (Signed)
Pt with steady gait. Walked to door for discharge with this RN

## 2018-12-04 NOTE — ED Notes (Signed)
First Nurse Note: Pt to stat desk to ask how much longer it would be before she was seen. Pt informed 1 patient has been waiting longer than her, however, we do not see pts based on wait time. Pt verbalized that she had been vomiting. Pt is in NAD at this time. Ambulatory without difficulty. No distress noted.

## 2018-12-04 NOTE — ED Notes (Signed)
FIRST NURSE NOTE:  Pt to nurse's station requesting a blanket, pt also asking about wait time, informed her that there was still another person infront of her and that I do not know how long it will be until she is seen. Pt temp re-checked and was 98.4. Pt given blanket.

## 2018-12-04 NOTE — ED Notes (Signed)
Pt states temp of 102 last night, 101 this am and she took tylenol at 0700am.

## 2018-12-05 ENCOUNTER — Telehealth: Payer: Self-pay | Admitting: Family Medicine

## 2018-12-05 ENCOUNTER — Telehealth (HOSPITAL_COMMUNITY): Payer: Self-pay

## 2018-12-05 DIAGNOSIS — R0602 Shortness of breath: Secondary | ICD-10-CM

## 2018-12-05 LAB — SARS CORONAVIRUS 2 (TAT 6-24 HRS): SARS Coronavirus 2: POSITIVE — AB

## 2018-12-05 NOTE — Telephone Encounter (Signed)
Pt went to ER yesterday for fever coughing, coughing up mucus, stomach ache, COVID test was positive.  Pt was told she may end up with bronchial issues.  Pt was advised to call PCP.  Please call pt back to discuss.  Thanks, [employer]

## 2018-12-05 NOTE — Telephone Encounter (Signed)
Pt calling due to Health Dept is asking for Dr. Caryn Section to request a new swab test to test pt for COVID.  The original test came back positive. They also asked for a Rx to be prescribed for pt's cough.  Please call pt back at [phone].  Thanks, [employer]

## 2018-12-05 NOTE — Telephone Encounter (Signed)
Pt. Called and informed this RN that she received a phonecall from the health department to follow-up with her Positive Covid Results.  Pt.s reports that she received a phone call from her PCP this am and they requested her to have the Covid-19 results due to a problem with the swab.  Pt. Went to her PCP office and had the Covid test done again.  After she spoke with the Health Department they informed her that she will need to have the Covid test redone at [facility] since that was where the positive results came from.  I explained to pt. That I do not work with the Health Department and she should refer her questions to them and her PCP.  She verbalized understanding.  I also discussed with her that she did receive a positive Covid and she should maintain self-isolate until she hears from her PCP with her results.  She verbalized understanding and all questions answered.

## 2018-12-05 NOTE — Telephone Encounter (Signed)
Should we schedule a virtual ER follow up? Or do you have any further recommendations?

## 2018-12-06 MED ORDER — ALBUTEROL SULFATE HFA 108 (90 BASE) MCG/ACT IN AERS: 1.0000 | INHALATION_SPRAY | Freq: Four times a day (QID) | RESPIRATORY_TRACT | 6 refills | Status: DC | PRN

## 2018-12-06 NOTE — Telephone Encounter (Signed)
Patient advised as below and verbally voiced understanding. She would like a prescription for an inhaler sent to [facility]. I placed an order for the inhaler, but had to change the grams since the previous amount was not available. Please review prescription and sign.  She says the ER put her on Prednisone.   Also, patient states someone from the ER called and told her that her COVID test sample was contaminated and she should be retested. Patient tried to get retested through the health Department, but they told her she needed to be retested through [facility] since that was where she received a positive result. Patient states she drove down to a facility in [facility] and had 2 COVID test done. The first test came back "invalidated", and the second test results are still pending. Patient states she should have the final results back by tomorrow. Patient states she is going to request that the facility fax over her test results to out office.

## 2018-12-06 NOTE — Telephone Encounter (Signed)
See. Previous message. Patient had retesting done in [facility] and results are pending.

## 2018-12-06 NOTE — Telephone Encounter (Signed)
She needs to stay quarantine for 10 days. If she develops any worsening trouble breathing she needs to go to ER. She can use an albuterol inhaler for coughing and wheezing. We can send in prescription of she is out of albuterol.

## 2018-12-07 NOTE — Telephone Encounter (Signed)
Pt was retested and tested positive for COVID. She cancelled her Nov 9th appt and will reschedule.  Thanks, [employer]

## 2018-12-12 ENCOUNTER — Ambulatory Visit: Payer: Self-pay | Admitting: Family Medicine

## 2018-12-16 ENCOUNTER — Other Ambulatory Visit: Payer: Self-pay

## 2018-12-16 ENCOUNTER — Encounter: Payer: Self-pay | Admitting: Psychiatry

## 2018-12-16 ENCOUNTER — Ambulatory Visit (INDEPENDENT_AMBULATORY_CARE_PROVIDER_SITE_OTHER): Payer: Medicaid Other | Admitting: Psychiatry

## 2018-12-16 DIAGNOSIS — F330 Major depressive disorder, recurrent, mild: Secondary | ICD-10-CM | POA: Diagnosis not present

## 2018-12-16 DIAGNOSIS — G4700 Insomnia, unspecified: Secondary | ICD-10-CM

## 2018-12-16 MED ORDER — BELSOMRA 5 MG PO TABS: 5.0000 mg | ORAL_TABLET | Freq: Every evening | ORAL | 0 refills | Status: DC | PRN

## 2018-12-16 NOTE — Patient Instructions (Signed)
Suvorexant oral tablets What is this medicine? SUVOREXANT (su-vor-EX-ant) is used to treat insomnia. This medicine helps you to fall asleep and sleep through the night. This medicine may be used for other purposes; ask your health care provider or pharmacist if you have questions. COMMON BRAND NAME(S): Belsomra What should I tell my health care provider before I take this medicine? They need to know if you have any of these conditions:  depression  drink alcohol  drug abuse or addiction  feel sleepy or have fallen asleep suddenly during the day  history of a sudden onset of muscle weakness (cataplexy)  liver disease  lung or breathing disease, like asthma or emphysema  sleep apnea  suicidal thoughts, plans, or attempt; a previous suicide attempt by you or a family member  an unusual or allergic reaction to suvorexant, other medicines, foods, dyes, or preservatives  pregnant or trying to get pregnant  breast-feeding How should I use this medicine? Take this medicine by mouth within 30 minutes of going to bed. Do not take it unless you are able to stay in bed a full night before you must be active again. Follow the directions on the prescription label. You may take this medicine with or without a food. However, this medicine may take longer to work if you take it with or right after meals. Do not take your medicine more often than directed. Do not stop taking this medicine on your own. Always follow your doctor or health care professional's advice. A special MedGuide will be given to you by the pharmacist with each prescription and refill. Be sure to read this information carefully each time. Talk to your pediatrician regarding the use of this medicine in children. Special care may be needed. Overdosage: If you think you have taken too much of this medicine contact a poison control center or emergency room at once. NOTE: This medicine is only for you. Do not share this medicine with  others. What if I miss a dose? This medicine should only be taken immediately before going to sleep. Do not take double or extra doses. What may interact with this medicine?  alcohol  antihistamines for allergy, cough, or cold  aprepitant  boceprevir  certain antibiotics like ciprofloxacin, clarithromycin, erythromycin, telithromycin  certain antivirals for HIV or AIDS  certain medicines for anxiety or sleep  certain medicines for depression like amitriptyline, fluoxetine, nefazodone, sertraline  certain medicines for fungal infections like ketoconazole, posaconazole, fluconazole, itraconazole  certain medicines for seizures like carbamazepine, phenobarbital, primidone, phenytoin  conivaptan  digoxin  diltiazem  general anesthetics like halothane, isoflurane, methoxyflurane, propofol  grapefruit juice  imatinib  medicines that relax muscles for surgery  narcotic medicines for pain  phenothiazines like chlorpromazine, mesoridazine, prochlorperazine, thioridazine  rifampin  verapamil This list may not describe all possible interactions. Give your health care provider a list of all the medicines, herbs, non-prescription drugs, or dietary supplements you use. Also tell them if you smoke, drink alcohol, or use illegal drugs. Some items may interact with your medicine. What should I watch for while using this medicine? Visit your health care professional for regular checks on your progress. Tell your health care professional if your symptoms do not start to get better or if they get worse. Avoid caffeine-containing drinks in the evening hours. After taking this medicine, you may get up out of bed and do an activity that you do not know you are doing. The next morning, you may have no memory of this.  Activities include driving a car ("sleep-driving"), making and eating food, talking on the phone, sexual activity, and sleep-walking. Serious injuries have occurred. Call your  doctor right away if you find out you have done any of these activities. Do not take this medicine if you have used alcohol that evening. Do not take it if you have taken another medicine for sleep. °Do not take this medicine unless you are able to stay in bed for a full night (7 to 8 hours) and do not drive or perform other activities requiring full alertness within 8 hours of a dose. Do not drive, use machinery, or do anything that needs mental alertness the day after you take the 20 mg dose of this medicine. The use of lower doses (10 mg) may also cause driving impairment the next day. You may have a decrease in mental alertness the day after use, even if you feel that you are fully awake. Tell your doctor if you will need to perform activities requiring full alertness, such as driving, the next day. Do not stand or sit up quickly after taking this medicine, especially if you are an older patient. This reduces the risk of dizzy or fainting spells. °If you or your family notice any changes in your behavior, such as new or worsening depression, thoughts of harming yourself, anxiety, other unusual or disturbing thoughts, or memory loss, call your health care professional right away. °After you stop taking this medicine, you may have trouble falling asleep. This is called rebound insomnia. This problem usually goes away on its own after 1 or 2 nights. °What side effects may I notice from receiving this medicine? °Side effects that you should report to your doctor or health care professional as soon as possible: °· allergic reactions like skin rash, itching or hives, swelling of the face, lips, or tongue °· hallucinations °· periods of leg weakness lasting from seconds to a few minutes °· suicidal thoughts, mood changes °· unable to move or speak for several minutes while going to sleep or waking up °· unusual activities while not fully awake like driving, eating, making phone calls, or sexual activity °Side effects  that usually do not require medical attention (report these to your doctor or health care professional if they continue or are bothersome): °· daytime drowsiness °· headache °· nightmares or abnormal dreams °· tiredness °This list may not describe all possible side effects. Call your doctor for medical advice about side effects. You may report side effects to FDA at 1-800-FDA-1088. °Where should I keep my medicine? °Keep out of the reach of children. This medicine can be abused. Keep your medicine in a safe place to protect it from theft. Do not share this medicine with anyone. Selling or giving away this medicine is dangerous and against the law. °Store at room temperature between 15 and 30 degrees C (59 and 86 degrees F). Throw away any unused medicine after the expiration date. °NOTE: This sheet is a summary. It may not cover all possible information. If you have questions about this medicine, talk to your doctor, pharmacist, or health care provider. °© 2020 [employer] (2018-02-04 16:37:12) ° °
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
## 2018-12-27 ENCOUNTER — Telehealth: Payer: Self-pay

## 2018-12-27 ENCOUNTER — Other Ambulatory Visit: Payer: Self-pay

## 2018-12-27 DIAGNOSIS — Z20822 Contact with and (suspected) exposure to covid-19: Secondary | ICD-10-CM

## 2018-12-27 DIAGNOSIS — G4700 Insomnia, unspecified: Secondary | ICD-10-CM

## 2018-12-27 MED ORDER — ZOLPIDEM TARTRATE 5 MG PO TABS: 5.0000 mg | ORAL_TABLET | Freq: Every evening | ORAL | 0 refills | Status: DC | PRN

## 2018-12-27 NOTE — Telephone Encounter (Signed)
received fax that the belsomra is not covered under the patient medicaid.

## 2018-12-27 NOTE — Telephone Encounter (Signed)
Returned call to patient.  Discussed stopping Belsomra since it is expensive. Start Ambien 5 mg p.o. 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  It is showing up as a allergic contraindication when writer tried to order Ambien.  Discussed this with patient.  She will monitor herself for any side effects.

## 2018-12-29 LAB — NOVEL CORONAVIRUS, NAA: SARS-CoV-2, NAA: NOT DETECTED

## 2019-01-06 ENCOUNTER — Ambulatory Visit: Payer: Medicaid Other | Admitting: Physician Assistant

## 2019-01-06 ENCOUNTER — Other Ambulatory Visit: Payer: Self-pay

## 2019-01-06 ENCOUNTER — Encounter: Payer: Self-pay | Admitting: Physician Assistant

## 2019-01-06 ENCOUNTER — Other Ambulatory Visit (HOSPITAL_COMMUNITY)
Admission: RE | Admit: 2019-01-06 | Discharge: 2019-01-06 | Disposition: A | Discharge status: Home / Self Care | Payer: Medicaid Other | Source: Ambulatory Visit | Attending: Physician Assistant | Admitting: Physician Assistant

## 2019-01-06 VITALS — BP 102/61 | HR 73 | Temp 97.1°F | Resp 18 | Ht <= 58 in | Wt 137.0 lb

## 2019-01-06 DIAGNOSIS — Z124 Encounter for screening for malignant neoplasm of cervix: Secondary | ICD-10-CM | POA: Insufficient documentation

## 2019-01-06 DIAGNOSIS — F4321 Adjustment disorder with depressed mood: Secondary | ICD-10-CM

## 2019-01-06 DIAGNOSIS — N898 Other specified noninflammatory disorders of vagina: Secondary | ICD-10-CM | POA: Diagnosis present

## 2019-01-06 DIAGNOSIS — R300 Dysuria: Secondary | ICD-10-CM | POA: Diagnosis not present

## 2019-01-06 LAB — POCT URINALYSIS DIPSTICK
Bilirubin, UA: NEGATIVE
Glucose, UA: NEGATIVE
Ketones, UA: NEGATIVE
Leukocytes, UA: NEGATIVE
Nitrite, UA: NEGATIVE
Protein, UA: NEGATIVE
Spec Grav, UA: >=1.03 — AB (ref 1.010–1.025)
Urobilinogen, UA: 0.2 E.U./dL
pH, UA: 6 (ref 5.0–8.0)

## 2019-01-06 MED ORDER — CEPHALEXIN 500 MG PO CAPS: 500.0000 mg | ORAL_CAPSULE | Freq: Two times a day (BID) | ORAL | 0 refills | Status: AC

## 2019-01-06 NOTE — Progress Notes (Signed)
Patient: Erin Richards Female    DOB: August 01, 1965   53 y.o.   MRN: [id number] Visit Date: 01/06/2019  Today's Provider: Trinna Post, PA-C   Chief Complaint  Patient presents with  . Vaginal Discharge   Subjective:     Vaginal Discharge The patient's primary symptoms include vaginal discharge. The current episode started in the past 7 days. The problem occurs constantly. The problem has been unchanged. Pertinent negatives include no back pain, constipation, diarrhea, dysuria, hematuria, nausea or urgency. The vaginal discharge was clear and white.   Patient also reports burning with urination x several days. Denies nausea vomiting, abdominal pain, diarrhea, fevers ,chills.   She is also due for her PAP smear, last one normal and negative hPV 01/2014.   She reports she is interested in seeing a therapist for grief and depression. She is currently followed by a psychiatrist.   Allergies  Allergen Reactions  . Acetaminophen-Codeine Nausea And Vomiting  . Antiseptic Oral Rinse [Cetylpyridinium Chloride] Other (See Comments)    Mouth sores  . Aspartame Other (See Comments)    Reaction: unknown  . Biaxin [Clarithromycin] Nausea And Vomiting  . Carafate [Sucralfate]     Pt says her "Stomach hurts" when she takes it.    . Chlorhexidine Gluconate Nausea And Vomiting  . Clindamycin/Lincomycin Nausea And Vomiting  . Codeine Itching and Nausea And Vomiting  . Dextromethorphan Hbr Other (See Comments)    Reaction: unknown  . Dilaudid [Hydromorphone Hcl] Nausea And Vomiting  . Doxycycline Nausea And Vomiting  . Fentanyl Nausea And Vomiting  . Fluticasone-Salmeterol Other (See Comments)    Blister in mouth Other reaction(s): Other (See Comments) Blister in mouth  . Germanium Other (See Comments)  . Hydrocodone Nausea And Vomiting  . Ketorolac Other (See Comments)  . Levofloxacin Other (See Comments)    GI upset. GI upset  . Mefenamic Acid Nausea And Vomiting  .  Metformin And Related Nausea And Vomiting  . Metronidazole Diarrhea and Nausea And Vomiting  . Morphine And Related Nausea And Vomiting  . Moxifloxacin Swelling  . Nitrofurantoin Nausea And Vomiting and Other (See Comments)  . Nsaids Other (See Comments)    Reaction: unknown  . Oxycodone-Acetaminophen Nausea And Vomiting  . Periguard [Dimethicone] Nausea And Vomiting  . Phenothiazines Nausea And Vomiting  . Pioglitazone Nausea And Vomiting  . Quinidine Nausea And Vomiting  . Quinolones Nausea And Vomiting  . Rumex Crispus Other (See Comments)  . Tetracyclines & Related Nausea And Vomiting  . Toradol [Ketorolac Tromethamine] Nausea And Vomiting  . Tramadol Nausea And Vomiting  . Tussin [Guaifenesin] Nausea And Vomiting  . Tussionex Pennkinetic Er [Hydrocod Polst-Cpm Polst Er] Nausea And Vomiting  . Buprenorphine Hcl Nausea And Vomiting  . Lincomycin Hcl Nausea And Vomiting  . Oxycodone-Acetaminophen Hives and Nausea And Vomiting    Other reaction(s): Nausea And Vomiting, Unknown  . Phenylalanine Nausea And Vomiting     Current Outpatient Medications:  .  albuterol (PROAIR HFA) 108 (90 Base) MCG/ACT inhaler, Inhale 1-2 puffs into the lungs every 6 (six) hours as needed for shortness of breath., Disp: 8 g, Rfl: 6 .  azelastine (ASTELIN) 0.1 % nasal spray, Place 1 spray into both nostrils 2 (two) times daily., Disp: 30 mL, Rfl: 12 .  baclofen (LIORESAL) 10 MG tablet, Take 1 tablet (10 mg total) by mouth 3 (three) times daily. (Patient not taking: Reported on 12/16/2018), Disp: 30 each, Rfl: 0 .  cetirizine (ZYRTEC)  10 MG tablet, Take 1 tablet (10 mg total) by mouth daily., Disp: 30 tablet, Rfl: 11 .  COMBIVENT RESPIMAT 20-100 MCG/ACT AERS respimat, INHALE 1 PUFF INTO THE LUNGS EVERY 4 (FOUR) HOURS AS NEEDED FOR WHEEZING., Disp: 1 Inhaler, Rfl: 5 .  fexofenadine (ALLEGRA) 60 MG tablet, Take 1 tablet (60 mg total) by mouth 2 (two) times daily., Disp: 60 tablet, Rfl: 4 .  fluticasone  (FLONASE) 50 MCG/ACT nasal spray, PLACE 2 SPRAYS INTO BOTH NOSTRILS DAILY AS NEEDED FOR ALLERGIES OR RHINITIS. REPORTED ON 04/03/2015, Disp: 16 g, Rfl: 5 .  gabapentin (NEURONTIN) 300 MG capsule, TAKE 1 CAPSULE (300 MG TOTAL) BY MOUTH DAILY., Disp: 90 capsule, Rfl: 4 .  hyoscyamine (LEVSIN, ANASPAZ) 0.125 MG tablet, TAKE ONE TABLET BY MOUTH EVERY 6 HOURS AS NEEDED FOR CRAMPING FOR UP TO 10 DAYS, Disp: 30 tablet, Rfl: 5 .  ipratropium (ATROVENT) 0.03 % nasal spray, PLACE 2 SPRAYS INTO THE NOSE DAILY AS NEEDED., Disp: 30 mL, Rfl: 4 .  loperamide (IMODIUM A-D) 2 MG tablet, Take 1 tablet (2 mg total) by mouth 4 (four) times daily as needed for diarrhea or loose stools., Disp: 30 tablet, Rfl: 0 .  meloxicam (MOBIC) 15 MG tablet, Take by mouth., Disp: , Rfl:  .  montelukast (SINGULAIR) 10 MG tablet, TAKE 1 TABLET (10 MG TOTAL) BY MOUTH AT BEDTIME., Disp: 30 tablet, Rfl: 12 .  naproxen (NAPROSYN) 375 MG tablet, TAKE 1 TABLET (375 MG TOTAL) BY MOUTH 2 (TWO) TIMES DAILY WITH A MEAL., Disp: 30 tablet, Rfl: 5 .  nortriptyline (PAMELOR) 25 MG capsule, TAKE ONE EVERY DAY AT BEDTIME AND ONE DURING THE DAY AS NEEDED FOR MIGRAINES., Disp: 180 capsule, Rfl: 1 .  pantoprazole (PROTONIX) 40 MG tablet, TAKE 1 TABLET BY MOUTH TWICE A DAY, Disp: 60 tablet, Rfl: 5 .  predniSONE (DELTASONE) 10 MG tablet, Take 5 tablets (50 mg total) by mouth daily. (Patient not taking: Reported on 12/16/2018), Disp: 25 tablet, Rfl: 0 .  tiotropium (SPIRIVA) 18 MCG inhalation capsule, Place 1 capsule (18 mcg total) into inhaler and inhale daily., Disp: 30 capsule, Rfl: 12 .  zolpidem (AMBIEN) 5 MG tablet, Take 1 tablet (5 mg total) by mouth at bedtime as needed for sleep., Disp: 30 tablet, Rfl: 0  Review of Systems  Gastrointestinal: Negative for constipation, diarrhea and nausea.  Genitourinary: Positive for vaginal discharge. Negative for dysuria, hematuria and urgency.  Musculoskeletal: Negative for back pain.    Social History    Tobacco Use  . Smoking status: Never Smoker  . Smokeless tobacco: Never Used  Substance Use Topics  . Alcohol use: No      Objective:   Wt 137 lb (62.1 kg)   BMI 29.65 kg/m  Vitals:   01/06/19 1007  Weight: 137 lb (62.1 kg)  Body mass index is 29.65 kg/m.   Physical Exam Exam conducted with a chaperone present.  Constitutional:      Appearance: Normal appearance.  Cardiovascular:     Rate and Rhythm: Normal rate and regular rhythm.  Pulmonary:     Effort: Pulmonary effort is normal.  Genitourinary:    General: Normal vulva.     Exam position: Lithotomy position.     Vagina: Vaginal discharge present. No erythema, bleeding or lesions.     Cervix: No cervical motion tenderness, discharge, friability, lesion, erythema, cervical bleeding or eversion.  Skin:    General: Skin is warm and dry.  Neurological:     Mental Status: She is  alert and oriented to person, place, and time. Mental status is at baseline.  Psychiatric:        Mood and Affect: Mood normal.        Behavior: Behavior normal.      No results found for any visits on 01/06/19.     Assessment & Plan    1. Vaginal discharge  Will send in swab and treat accordingly.   - Cervicovaginal ancillary only - POCT Urinalysis Dipstick  2. Cervical cancer screening  - Cytology - PAP  3. Grief  - Ambulatory referral to Chronic Care Management Services  4. Dysuria  Patient reports she tolerates keflex well. Will send in course as below.   - cephALEXin (KEFLEX) 500 MG capsule; Take 1 capsule (500 mg total) by mouth 2 (two) times daily for 5 days.  Dispense: 10 capsule; Refill: 0 - CULTURE, URINE COMPREHENSIVE  I have spent 25 minutes with this patient, >50% of which was spent on counseling and coordination of care.   The entirety of the information documented in the History of Present Illness, Review of Systems and Physical Exam were personally obtained by me. Portions of this information were  initially documented by [facility], CMA and reviewed by me for thoroughness and accuracy.         Trinna Post, PA-C  [facility]

## 2019-01-06 NOTE — Patient Instructions (Signed)

## 2019-01-09 ENCOUNTER — Encounter: Payer: Self-pay | Admitting: Physician Assistant

## 2019-01-09 DIAGNOSIS — B9689 Other specified bacterial agents as the cause of diseases classified elsewhere: Secondary | ICD-10-CM

## 2019-01-09 DIAGNOSIS — N760 Acute vaginitis: Secondary | ICD-10-CM

## 2019-01-09 LAB — CERVICOVAGINAL ANCILLARY ONLY
Bacterial Vaginitis (gardnerella): POSITIVE — AB
Candida Glabrata: NEGATIVE
Candida Vaginitis: NEGATIVE
Chlamydia: NEGATIVE
Comment: NEGATIVE
Comment: NEGATIVE
Comment: NEGATIVE
Comment: NEGATIVE
Comment: NEGATIVE
Comment: NORMAL
Neisseria Gonorrhea: NEGATIVE
Trichomonas: NEGATIVE

## 2019-01-09 LAB — CULTURE, URINE COMPREHENSIVE

## 2019-01-10 ENCOUNTER — Telehealth: Payer: Self-pay

## 2019-01-10 ENCOUNTER — Encounter: Payer: Self-pay | Admitting: Physician Assistant

## 2019-01-10 LAB — CYTOLOGY - PAP
Comment: NEGATIVE
Diagnosis: NEGATIVE
High risk HPV: NEGATIVE

## 2019-01-10 MED ORDER — METRONIDAZOLE 0.75 % VA GEL: 1.0000 | Freq: Every day | VAGINAL | 0 refills | Status: AC

## 2019-01-10 NOTE — Telephone Encounter (Signed)
Patient was advised.  

## 2019-01-10 NOTE — Telephone Encounter (Signed)
-----   Message from Trinna Post, [place] sent at 01/10/2019 12:31 PM EST ----- Jackelyn Poling,   Your PAP is normal. You can repeat this in five years.   Best, Fabio Bering

## 2019-01-12 ENCOUNTER — Ambulatory Visit: Payer: Self-pay | Admitting: *Deleted

## 2019-01-12 NOTE — Chronic Care Management (AMB) (Signed)
   Care Management   Social Work Note  01/12/2019 Name: Erin Richards MRN: [id number] DOB: 04/15/1965  Erin Richards is a 53 y.o. year old female who sees Fisher, Kirstie Peri, MD for primary care. The CCM team was consulted for assistance with Mental Health Counseling and Resources.   Phone call to patient who discussed that she is beginning to have difficulty dealing with her grief issues as the holidays are approaching. Patient agreeable to CM services at this time. Initial assessment scheduled for 01/19/19.  Outpatient Encounter Medications as of 01/12/2019  Medication Sig Note  . albuterol (PROAIR HFA) 108 (90 Base) MCG/ACT inhaler Inhale 1-2 puffs into the lungs every 6 (six) hours as needed for shortness of breath.   Marland Kitchen azelastine (ASTELIN) 0.1 % nasal spray Place 1 spray into both nostrils 2 (two) times daily.   . baclofen (LIORESAL) 10 MG tablet Take 1 tablet (10 mg total) by mouth 3 (three) times daily. (Patient not taking: Reported on 12/16/2018)   . cetirizine (ZYRTEC) 10 MG tablet Take 1 tablet (10 mg total) by mouth daily.   . COMBIVENT RESPIMAT 20-100 MCG/ACT AERS respimat INHALE 1 PUFF INTO THE LUNGS EVERY 4 (FOUR) HOURS AS NEEDED FOR WHEEZING.   . fexofenadine (ALLEGRA) 60 MG tablet Take 1 tablet (60 mg total) by mouth 2 (two) times daily.   . fluticasone (FLONASE) 50 MCG/ACT nasal spray PLACE 2 SPRAYS INTO BOTH NOSTRILS DAILY AS NEEDED FOR ALLERGIES OR RHINITIS. REPORTED ON 04/03/2015   . gabapentin (NEURONTIN) 300 MG capsule TAKE 1 CAPSULE (300 MG TOTAL) BY MOUTH DAILY.   . hyoscyamine (LEVSIN, ANASPAZ) 0.125 MG tablet TAKE ONE TABLET BY MOUTH EVERY 6 HOURS AS NEEDED FOR CRAMPING FOR UP TO 10 DAYS   . ipratropium (ATROVENT) 0.03 % nasal spray PLACE 2 SPRAYS INTO THE NOSE DAILY AS NEEDED.   Marland Kitchen loperamide (IMODIUM A-D) 2 MG tablet Take 1 tablet (2 mg total) by mouth 4 (four) times daily as needed for diarrhea or loose stools.   . meloxicam (MOBIC) 15 MG tablet Take by mouth.   .  metroNIDAZOLE (METROGEL VAGINAL) 0.75 % vaginal gel Place 1 Applicatorful vaginally at bedtime for 7 days.   . montelukast (SINGULAIR) 10 MG tablet TAKE 1 TABLET (10 MG TOTAL) BY MOUTH AT BEDTIME.   . naproxen (NAPROSYN) 375 MG tablet TAKE 1 TABLET (375 MG TOTAL) BY MOUTH 2 (TWO) TIMES DAILY WITH A MEAL.   Marland Kitchen nortriptyline (PAMELOR) 25 MG capsule TAKE ONE EVERY DAY AT BEDTIME AND ONE DURING THE DAY AS NEEDED FOR MIGRAINES. 12/16/2018: Once a month as needed for headaches  . pantoprazole (PROTONIX) 40 MG tablet TAKE 1 TABLET BY MOUTH TWICE A DAY   . predniSONE (DELTASONE) 10 MG tablet Take 5 tablets (50 mg total) by mouth daily. (Patient not taking: Reported on 12/16/2018)   . tiotropium (SPIRIVA) 18 MCG inhalation capsule Place 1 capsule (18 mcg total) into inhaler and inhale daily.   Marland Kitchen zolpidem (AMBIEN) 5 MG tablet Take 1 tablet (5 mg total) by mouth at bedtime as needed for sleep.    No facility-administered encounter medications on file as of 01/12/2019.    Goals Addressed   None     Follow Up Plan: Appointment scheduled for SW follow up with client by phone on: 01/19/19   Elliot Gurney, [place] Worker  [facility] Care Management [phone]

## 2019-01-19 ENCOUNTER — Ambulatory Visit (INDEPENDENT_AMBULATORY_CARE_PROVIDER_SITE_OTHER): Payer: Medicaid Other | Admitting: Psychiatry

## 2019-01-19 ENCOUNTER — Other Ambulatory Visit: Payer: Self-pay

## 2019-01-19 ENCOUNTER — Ambulatory Visit: Payer: Self-pay | Admitting: *Deleted

## 2019-01-19 ENCOUNTER — Encounter: Payer: Self-pay | Admitting: Psychiatry

## 2019-01-19 DIAGNOSIS — F330 Major depressive disorder, recurrent, mild: Secondary | ICD-10-CM

## 2019-01-19 DIAGNOSIS — F5105 Insomnia due to other mental disorder: Secondary | ICD-10-CM

## 2019-01-19 MED ORDER — ESCITALOPRAM OXALATE 10 MG PO TABS: 5.0000 mg | ORAL_TABLET | Freq: Every day | ORAL | 1 refills | Status: DC

## 2019-01-19 MED ORDER — ZOLPIDEM TARTRATE 5 MG PO TABS: 5.0000 mg | ORAL_TABLET | Freq: Every evening | ORAL | 1 refills | Status: DC | PRN
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
## 2019-01-19 NOTE — Progress Notes (Signed)
Patient: Erin Richards Female    DOB: [birth year]   53 y.o.   MRN: [id number] Visit Date: 01/20/2019  Today's Provider: Mar Daring, PA-C   No chief complaint on file.  Subjective:     Virtual Visit via Telephone Note  I connected with Erin Richards on 01/20/19 at  9:00 AM EST by telephone and verified that I am speaking with the correct person using two identifiers.  Location: Patient: Home Provider: [facility]   I discussed the limitations, risks, security and privacy concerns of performing an evaluation and management service by telephone and the availability of in person appointments. I also discussed with the patient that there may be a patient responsible charge related to this service. The patient expressed understanding and agreed to proceed.  Sinusitis This is a new problem. The problem has been gradually worsening since onset. There has been no fever. Associated symptoms include congestion, coughing and sinus pressure. Pertinent negatives include no diaphoresis, ear pain, headaches, shortness of breath or sore throat. Treatments tried: Sudafed. The treatment provided no relief.  Back Pain This is a new problem. The current episode started 1 to 4 weeks ago. The problem occurs constantly. The problem has been gradually worsening since onset. Pain location: all her back from neck to her lower back. The pain does not radiate. The pain is at a severity of 9/10. The pain is moderate. The pain is worse during the day. Stiffness is present in the morning. Pertinent negatives include no abdominal pain, chest pain, fever, headaches, leg pain, numbness, pelvic pain or weakness. Risk factors: hx of arthritis. Treatments tried: Tylenol. The treatment provided no relief.    Also reports recurrent BV. Was treated in the past with metronidazole cream, but once she completed treatment the odor came right back.   Allergies  Allergen Reactions  . Acetaminophen-Codeine Nausea And  Vomiting  . Antiseptic Oral Rinse [Cetylpyridinium Chloride] Other (See Comments)    Mouth sores  . Aspartame Other (See Comments)    Reaction: unknown  . Biaxin [Clarithromycin] Nausea And Vomiting  . Carafate [Sucralfate]     Pt says her "Stomach hurts" when she takes it.    . Chlorhexidine Gluconate Nausea And Vomiting  . Clindamycin/Lincomycin Nausea And Vomiting  . Codeine Itching and Nausea And Vomiting  . Dextromethorphan Hbr Other (See Comments)    Reaction: unknown  . Dilaudid [Hydromorphone Hcl] Nausea And Vomiting  . Doxycycline Nausea And Vomiting  . Fentanyl Nausea And Vomiting  . Fluticasone-Salmeterol Other (See Comments)    Blister in mouth Other reaction(s): Other (See Comments) Blister in mouth  . Germanium Other (See Comments)  . Hydrocodone Nausea And Vomiting  . Ketorolac Other (See Comments)  . Levofloxacin Other (See Comments)    GI upset. GI upset  . Mefenamic Acid Nausea And Vomiting  . Metformin And Related Nausea And Vomiting  . Metronidazole Diarrhea and Nausea And Vomiting  . Morphine And Related Nausea And Vomiting  . Moxifloxacin Swelling  . Nitrofurantoin Nausea And Vomiting and Other (See Comments)  . Nsaids Other (See Comments)    Reaction: unknown  . Oxycodone-Acetaminophen Nausea And Vomiting  . Periguard [Dimethicone] Nausea And Vomiting  . Phenothiazines Nausea And Vomiting  . Pioglitazone Nausea And Vomiting  . Quinidine Nausea And Vomiting  . Quinolones Nausea And Vomiting  . Rumex Crispus Other (See Comments)  . Tetracyclines & Related Nausea And Vomiting  . Toradol [Ketorolac Tromethamine] Nausea And Vomiting  .  Tramadol Nausea And Vomiting  . Tussin [Guaifenesin] Nausea And Vomiting  . Tussionex Pennkinetic Er [Hydrocod Polst-Cpm Polst Er] Nausea And Vomiting  . Buprenorphine Hcl Nausea And Vomiting  . Lincomycin Hcl Nausea And Vomiting  . Oxycodone-Acetaminophen Hives and Nausea And Vomiting    Other reaction(s): Nausea And  Vomiting, Unknown  . Phenylalanine Nausea And Vomiting     Current Outpatient Medications:  .  albuterol (PROAIR HFA) 108 (90 Base) MCG/ACT inhaler, Inhale 1-2 puffs into the lungs every 6 (six) hours as needed for shortness of breath., Disp: 8 g, Rfl: 6 .  azelastine (ASTELIN) 0.1 % nasal spray, Place 1 spray into both nostrils 2 (two) times daily., Disp: 30 mL, Rfl: 12 .  cetirizine (ZYRTEC) 10 MG tablet, Take 1 tablet (10 mg total) by mouth daily., Disp: 30 tablet, Rfl: 11 .  COMBIVENT RESPIMAT 20-100 MCG/ACT AERS respimat, INHALE 1 PUFF INTO THE LUNGS EVERY 4 (FOUR) HOURS AS NEEDED FOR WHEEZING., Disp: 1 Inhaler, Rfl: 5 .  fexofenadine (ALLEGRA) 60 MG tablet, Take 1 tablet (60 mg total) by mouth 2 (two) times daily., Disp: 60 tablet, Rfl: 4 .  fluticasone (FLONASE) 50 MCG/ACT nasal spray, PLACE 2 SPRAYS INTO BOTH NOSTRILS DAILY AS NEEDED FOR ALLERGIES OR RHINITIS. REPORTED ON 04/03/2015, Disp: 16 g, Rfl: 5 .  gabapentin (NEURONTIN) 300 MG capsule, TAKE 1 CAPSULE (300 MG TOTAL) BY MOUTH DAILY., Disp: 90 capsule, Rfl: 4 .  hyoscyamine (LEVSIN, ANASPAZ) 0.125 MG tablet, TAKE ONE TABLET BY MOUTH EVERY 6 HOURS AS NEEDED FOR CRAMPING FOR UP TO 10 DAYS, Disp: 30 tablet, Rfl: 5 .  ipratropium (ATROVENT) 0.03 % nasal spray, PLACE 2 SPRAYS INTO THE NOSE DAILY AS NEEDED., Disp: 30 mL, Rfl: 4 .  meloxicam (MOBIC) 15 MG tablet, Take by mouth., Disp: , Rfl:  .  montelukast (SINGULAIR) 10 MG tablet, TAKE 1 TABLET (10 MG TOTAL) BY MOUTH AT BEDTIME., Disp: 30 tablet, Rfl: 12 .  nortriptyline (PAMELOR) 25 MG capsule, TAKE ONE EVERY DAY AT BEDTIME AND ONE DURING THE DAY AS NEEDED FOR MIGRAINES., Disp: 180 capsule, Rfl: 1 .  pantoprazole (PROTONIX) 40 MG tablet, TAKE 1 TABLET BY MOUTH TWICE A DAY, Disp: 60 tablet, Rfl: 5 .  tiotropium (SPIRIVA) 18 MCG inhalation capsule, Place 1 capsule (18 mcg total) into inhaler and inhale daily., Disp: 30 capsule, Rfl: 12 .  zolpidem (AMBIEN) 5 MG tablet, Take 1 tablet (5 mg  total) by mouth at bedtime as needed for sleep., Disp: 30 tablet, Rfl: 1 .  baclofen (LIORESAL) 10 MG tablet, Take 1 tablet (10 mg total) by mouth 3 (three) times daily. (Patient not taking: Reported on 12/16/2018), Disp: 30 each, Rfl: 0 .  escitalopram (LEXAPRO) 10 MG tablet, Take 0.5-1 tablets (5-10 mg total) by mouth daily. Start taking half tablet daily  for 2 weeks and increase to 1 tablet ( 10 mg ) daily after 2 weeks. (Patient not taking: Reported on 01/20/2019), Disp: 30 tablet, Rfl: 1 .  loperamide (IMODIUM A-D) 2 MG tablet, Take 1 tablet (2 mg total) by mouth 4 (four) times daily as needed for diarrhea or loose stools. (Patient not taking: Reported on 01/20/2019), Disp: 30 tablet, Rfl: 0 .  naproxen (NAPROSYN) 375 MG tablet, TAKE 1 TABLET (375 MG TOTAL) BY MOUTH 2 (TWO) TIMES DAILY WITH A MEAL. (Patient not taking: Reported on 01/20/2019), Disp: 30 tablet, Rfl: 5  Review of Systems  Constitutional: Negative for diaphoresis and fever.  HENT: Positive for congestion, postnasal drip, sinus  pressure and sinus pain. Negative for ear pain, sore throat and trouble swallowing.   Respiratory: Positive for cough. Negative for chest tightness, shortness of breath and wheezing.   Cardiovascular: Negative for chest pain, palpitations and leg swelling.  Gastrointestinal: Negative for abdominal pain.  Genitourinary: Positive for vaginal discharge (with odor). Negative for pelvic pain.  Musculoskeletal: Positive for back pain, gait problem and myalgias.  Neurological: Negative for dizziness, weakness, numbness and headaches.    Social History   Tobacco Use  . Smoking status: Never Smoker  . Smokeless tobacco: Never Used  Substance Use Topics  . Alcohol use: No      Objective:   There were no vitals taken for this visit. There were no vitals filed for this visit.There is no height or weight on file to calculate BMI.   Physical Exam Vitals reviewed.  Constitutional:      General: She is  not in acute distress. Pulmonary:     Effort: Pulmonary effort is normal.  Neurological:     Mental Status: She is alert.      No results found for any visits on 01/20/19.     Assessment & Plan     1. BV (bacterial vaginosis) Recurred despite trying metronidazole cream due to patient's sensitivities with antibiotics. Will try Tinidazole since it has longer half life and less side effect profile to see if this resolves the issue. Discussed using RepHresh vaginal wash as well. Advised against douching. Call if recurs or not tolerating medication.  - tinidazole (TINDAMAX) 500 MG tablet; Take 2 tablets (1,000 mg total) by mouth daily with breakfast.  Dispense: 10 tablet; Refill: 0  2. Acute non-recurrent pansinusitis Patient also reports URI/Sinus symptoms. She reports only able to tolerate Keflex. This was given as below. She is going to try the tinidazole first as the BV is more problematic for her at this time. Continue OTC symptom relief of choice.  - cephALEXin (KEFLEX) 500 MG capsule; Take 1 capsule (500 mg total) by mouth 2 (two) times daily.  Dispense: 14 capsule; Refill: 0  3. Other seasonal allergic rhinitis Stable. Diagnosis pulled for medication refill. Continue current medical treatment plan. - fluticasone (FLONASE) 50 MCG/ACT nasal spray; Place 2 sprays into both nostrils daily as needed for allergies or rhinitis. Reported on 04/03/2015  Dispense: 16 g; Refill: 5  4. Allergic rhinitis due to pollen, unspecified seasonality Stable. Diagnosis pulled for medication refill. Continue current medical treatment plan. - ipratropium (ATROVENT) 0.03 % nasal spray; Place 2 sprays into the nose daily as needed.  Dispense: 30 mL; Refill: 4 - Ipratropium-Albuterol (COMBIVENT RESPIMAT) 20-100 MCG/ACT AERS respimat; Inhale 1 puff into the lungs every 4 (four) hours as needed for wheezing.  Dispense: 4 g; Refill: 0 - azelastine (ASTELIN) 0.1 % nasal spray; Place 1 spray into both nostrils 2  (two) times daily.  Dispense: 30 mL; Refill: 12  5. Chronic bilateral low back pain without sciatica Worsening back pain most likely due to worsening depression. Possible fibromyalgia component. Will give medrol as below. Call if worsening. May benefit from duloxetine. - methylPREDNISolone (MEDROL) 4 MG TBPK tablet; 6 day taper; take as directed on package instructions  Dispense: 21 tablet; Refill: 0   I discussed the assessment and treatment plan with the patient. The patient was provided an opportunity to ask questions and all were answered. The patient agreed with the plan and demonstrated an understanding of the instructions.   The patient was advised to call back or seek an  in-person evaluation if the symptoms worsen or if the condition fails to improve as anticipated.  I provided 20 minutes of non-face-to-face time during this encounter.    Mar Daring, PA-C  [facility]
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
## 2019-01-20 ENCOUNTER — Ambulatory Visit (INDEPENDENT_AMBULATORY_CARE_PROVIDER_SITE_OTHER): Payer: Medicaid Other | Admitting: Physician Assistant

## 2019-01-20 ENCOUNTER — Encounter: Payer: Self-pay | Admitting: Physician Assistant

## 2019-01-20 VITALS — Wt 136.0 lb

## 2019-01-20 DIAGNOSIS — J0140 Acute pansinusitis, unspecified: Secondary | ICD-10-CM | POA: Diagnosis not present

## 2019-01-20 DIAGNOSIS — B9689 Other specified bacterial agents as the cause of diseases classified elsewhere: Secondary | ICD-10-CM

## 2019-01-20 DIAGNOSIS — G8929 Other chronic pain: Secondary | ICD-10-CM

## 2019-01-20 DIAGNOSIS — J302 Other seasonal allergic rhinitis: Secondary | ICD-10-CM

## 2019-01-20 DIAGNOSIS — J301 Allergic rhinitis due to pollen: Secondary | ICD-10-CM | POA: Diagnosis not present

## 2019-01-20 DIAGNOSIS — N760 Acute vaginitis: Secondary | ICD-10-CM

## 2019-01-20 DIAGNOSIS — M545 Low back pain: Secondary | ICD-10-CM

## 2019-01-20 MED ORDER — FLUTICASONE PROPIONATE 50 MCG/ACT NA SUSP: 2.0000 | Freq: Every day | NASAL | 5 refills | Status: DC | PRN

## 2019-01-20 MED ORDER — AZELASTINE HCL 0.1 % NA SOLN: 1.0000 | Freq: Two times a day (BID) | NASAL | 12 refills | Status: DC

## 2019-01-20 MED ORDER — COMBIVENT RESPIMAT 20-100 MCG/ACT IN AERS: 1.0000 | INHALATION_SPRAY | RESPIRATORY_TRACT | 0 refills | Status: DC | PRN

## 2019-01-20 MED ORDER — CEPHALEXIN 500 MG PO CAPS: 500.0000 mg | ORAL_CAPSULE | Freq: Two times a day (BID) | ORAL | 0 refills | Status: DC

## 2019-01-20 MED ORDER — TINIDAZOLE 500 MG PO TABS: 1.0000 g | ORAL_TABLET | Freq: Every day | ORAL | 0 refills | Status: DC

## 2019-01-20 MED ORDER — IPRATROPIUM BROMIDE 0.03 % NA SOLN: 2.0000 | Freq: Every day | NASAL | 4 refills | Status: DC | PRN

## 2019-01-20 MED ORDER — METHYLPREDNISOLONE 4 MG PO TBPK: ORAL_TABLET | ORAL | 0 refills | Status: DC

## 2019-01-24 ENCOUNTER — Encounter: Payer: Self-pay | Admitting: Physician Assistant

## 2019-01-25 MED ORDER — METRONIDAZOLE 0.75 % VA GEL: 1.0000 | Freq: Two times a day (BID) | VAGINAL | 0 refills | Status: DC

## 2019-01-25 NOTE — Telephone Encounter (Signed)
Pt called to check on PA/ I advised her of note below and she asked what she should do about her issue because it is not getting better and if there is something else that can be called in today to help with the infection/odor. Pt wanted to make sure she knew something before the holiday weekend/ please advise

## 2019-01-31 ENCOUNTER — Other Ambulatory Visit: Payer: Self-pay | Admitting: Psychiatry

## 2019-01-31 DIAGNOSIS — F330 Major depressive disorder, recurrent, mild: Secondary | ICD-10-CM

## 2019-02-03 DIAGNOSIS — B9689 Other specified bacterial agents as the cause of diseases classified elsewhere: Secondary | ICD-10-CM

## 2019-02-03 HISTORY — DX: Other specified bacterial agents as the cause of diseases classified elsewhere: B96.89

## 2019-02-06 ENCOUNTER — Telehealth: Payer: Self-pay

## 2019-02-06 ENCOUNTER — Ambulatory Visit: Payer: Self-pay | Admitting: Family Medicine

## 2019-02-06 NOTE — Telephone Encounter (Signed)
Spoke to patient. She says she cannot take Lexapro since she started feeling foggy and also has stomach cramps. She reports she took only one dose of the medication and did not start it until yesterday eventhough it was prescribed two weeks ago. She says she does not want any other medications and does not want to return for appointments and will just stick with her therapist.

## 2019-02-06 NOTE — Progress Notes (Signed)
Patient: Erin Richards Female    DOB: September 02, 1965   54 y.o.   MRN: [id number] Visit Date: 02/06/2019  Today's Provider: Marcille Buffy, FNP   Chief Complaint  Patient presents with  . Abdominal Pain   Subjective:    Virtual Visit via Video Note  I connected with Erin Richards on 02/06/19 at 11:00 AM EST by a video enabled telemedicine application and verified that I am speaking with the correct person using two identifiers.  Location: Patient: at home  Provider: Provider: Provider's office at  [facility], [place].      I discussed the limitations of evaluation and management by telemedicine and the availability of in person appointments. The patient expressed understanding and agreed to proceed.  I discussed the assessment and treatment plan with the patient. The patient was provided an opportunity to ask questions and all were answered. The patient agreed with the plan and demonstrated an understanding of the instructions.   The patient was advised to call back or seek an in-person evaluation if the symptoms worsen or if the condition fails to improve as anticipated.    Abdominal Pain This is a new (patient states that she was started on Ambien, Tinidazole and Lexapro on 02/05/19) problem. The current episode started in the past 7 days. The onset quality is sudden. The problem has been gradually improving (patient states that she discontinued recent medications and states that symptoms are slowly improving). The pain is located in the generalized abdominal region. The pain is moderate. The quality of the pain is sharp. Associated symptoms include flatus, frequency and nausea. Pertinent negatives include no anorexia, arthralgias, belching, constipation, diarrhea, dysuria, fever, headaches, hematochezia, hematuria, melena, myalgias, vomiting or weight loss. Nothing aggravates the pain. The pain is relieved by nothing. She has tried nothing for the  symptoms. Her past medical history is significant for GERD and irritable bowel syndrome.    She is calling because she feels her Ambien and Lexapro started by her psychiatrist- she just started these on 01/24/19. She called them and they told her to continue  Her medications and follow up with them. The Ambien made her feel weird. She can stop the Ambien she is advised since she could not stay awake after taking it even during the day. She did not take the Ambien last night and feels fine today. She denies any vivid dreams, suicide or homicidal ideations or intents.   She is on Tinidazole for Bacterial Vaginosis she started on 02/03/2018 she was prescribed it on 12/18. She just picked it up late.  Cytology - PAP Order: [id number] Status:  Edited Result - FINAL Visible to patient:  Yes (MyChart) Next appt:  None Dx:  Cervical cancer screening Component 1 mo ago  High risk HPV Negative   Adequacy Satisfactory for evaluation. The presence or absence of an   Adequacy endocervical/transformation zone component cannot be determined because   Adequacy of atrophy.   Diagnosis - Negative for intraepithelial lesion or malignancy (NILM)   Comment Normal Reference Range HPV - Negative   Resulting Agency [facility]      Specimen Collected: 01/06/19 10:27 Last Resulted: 01/10/19 12:30       Cervicovaginal ancillary only Order: [id number] Status:  Edited Result - FINAL Visible to patient:  Yes (MyChart) Next appt:  None Dx:  Vaginal discharge   (important suggestion)  Newer results are available. Click to view them now.  Component 1 mo  ago  Neisseria Gonorrhea Negative   Chlamydia Negative   Trichomonas Negative   Bacterial Vaginitis (gardnerella) PositiveAbnormal    Candida Vaginitis Negative   Candida Glabrata Negative   Comment Normal Reference Range Bacterial Vaginosis - Negative   Comment Normal Reference Range Candida Species - Negative   Comment Normal Reference Range Candida Galbrata  - Negative   Comment Normal Reference Range Trichomonas - Negative   Comment Normal Reference Ranger Chlamydia - Negative   Comment Normal Reference Range Neisseria Gonorrhea - Negative   Resulting Agency [facility]        She denies any new vaginal symptoms reports they are about the same as when she saw previous provider on 12/18. She says her stomach pain is gone today. She has had a regular bowel movement. She states ": I feel so much better today". She was more concerned about falling asleep all day after taking the [place]. She reports she has no abdominal pain today. She continues to have a fishy odor vaginally.  She has had recurrent BV and it recurred despite Flagyl cream. She denies alcohol use with the medication. She was prescribed the Tinidazole  by Joette Catching PA- C on 01/16/21 did not start until this past Saturday. Denies abnormal vaginal bleeding.   She is allergic to Flagyl and Clindamycin orally. Reports she has used the cream Flagyl without difficulty however no results or improvement.   Patient  denies any fever, body aches,chills, rash, chest pain, shortness of breath, nausea, vomiting, or diarrhea.    Allergies  Allergen Reactions  . Acetaminophen-Codeine Nausea And Vomiting  . Antiseptic Oral Rinse [Cetylpyridinium Chloride] Other (See Comments)    Mouth sores  . Aspartame Other (See Comments)    Reaction: unknown  . Biaxin [Clarithromycin] Nausea And Vomiting  . Carafate [Sucralfate]     Pt says her "Stomach hurts" when she takes it.    . Chlorhexidine Gluconate Nausea And Vomiting  . Clindamycin/Lincomycin Nausea And Vomiting  . Codeine Itching and Nausea And Vomiting  . Dextromethorphan Hbr Other (See Comments)    Reaction: unknown  . Dilaudid [Hydromorphone Hcl] Nausea And Vomiting  . Doxycycline Nausea And Vomiting  . Fentanyl Nausea And Vomiting  . Fluticasone-Salmeterol Other (See Comments)    Blister in mouth Other reaction(s): Other (See  Comments) Blister in mouth  . Germanium Other (See Comments)  . Hydrocodone Nausea And Vomiting  . Ketorolac Other (See Comments)  . Levofloxacin Other (See Comments)    GI upset. GI upset  . Mefenamic Acid Nausea And Vomiting  . Metformin And Related Nausea And Vomiting  . Metronidazole Diarrhea and Nausea And Vomiting  . Morphine And Related Nausea And Vomiting  . Moxifloxacin Swelling  . Nitrofurantoin Nausea And Vomiting and Other (See Comments)  . Nsaids Other (See Comments)    Reaction: unknown  . Oxycodone-Acetaminophen Nausea And Vomiting  . Periguard [Dimethicone] Nausea And Vomiting  . Phenothiazines Nausea And Vomiting  . Pioglitazone Nausea And Vomiting  . Quinidine Nausea And Vomiting  . Quinolones Nausea And Vomiting  . Rumex Crispus Other (See Comments)  . Tetracyclines & Related Nausea And Vomiting  . Toradol [Ketorolac Tromethamine] Nausea And Vomiting  . Tramadol Nausea And Vomiting  . Tussin [Guaifenesin] Nausea And Vomiting  . Tussionex Pennkinetic Er [Hydrocod Polst-Cpm Polst Er] Nausea And Vomiting  . Buprenorphine Hcl Nausea And Vomiting  . Lincomycin Hcl Nausea And Vomiting  . Oxycodone-Acetaminophen Hives and Nausea And Vomiting    Other reaction(s):  Nausea And Vomiting, Unknown  . Phenylalanine Nausea And Vomiting     Current Outpatient Medications:  .  albuterol (PROAIR HFA) 108 (90 Base) MCG/ACT inhaler, Inhale 1-2 puffs into the lungs every 6 (six) hours as needed for shortness of breath., Disp: 8 g, Rfl: 6 .  azelastine (ASTELIN) 0.1 % nasal spray, Place 1 spray into both nostrils 2 (two) times daily., Disp: 30 mL, Rfl: 12 .  baclofen (LIORESAL) 10 MG tablet, Take 1 tablet (10 mg total) by mouth 3 (three) times daily. (Patient not taking: Reported on 12/16/2018), Disp: 30 each, Rfl: 0 .  cephALEXin (KEFLEX) 500 MG capsule, Take 1 capsule (500 mg total) by mouth 2 (two) times daily., Disp: 14 capsule, Rfl: 0 .  cetirizine (ZYRTEC) 10 MG tablet,  Take 1 tablet (10 mg total) by mouth daily., Disp: 30 tablet, Rfl: 11 .  fexofenadine (ALLEGRA) 60 MG tablet, Take 1 tablet (60 mg total) by mouth 2 (two) times daily., Disp: 60 tablet, Rfl: 4 .  fluticasone (FLONASE) 50 MCG/ACT nasal spray, Place 2 sprays into both nostrils daily as needed for allergies or rhinitis. Reported on 04/03/2015, Disp: 16 g, Rfl: 5 .  gabapentin (NEURONTIN) 300 MG capsule, TAKE 1 CAPSULE (300 MG TOTAL) BY MOUTH DAILY., Disp: 90 capsule, Rfl: 4 .  hyoscyamine (LEVSIN, ANASPAZ) 0.125 MG tablet, TAKE ONE TABLET BY MOUTH EVERY 6 HOURS AS NEEDED FOR CRAMPING FOR UP TO 10 DAYS, Disp: 30 tablet, Rfl: 5 .  ipratropium (ATROVENT) 0.03 % nasal spray, Place 2 sprays into the nose daily as needed., Disp: 30 mL, Rfl: 4 .  Ipratropium-Albuterol (COMBIVENT RESPIMAT) 20-100 MCG/ACT AERS respimat, Inhale 1 puff into the lungs every 4 (four) hours as needed for wheezing., Disp: 4 g, Rfl: 0 .  loperamide (IMODIUM A-D) 2 MG tablet, Take 1 tablet (2 mg total) by mouth 4 (four) times daily as needed for diarrhea or loose stools. (Patient not taking: Reported on 01/20/2019), Disp: 30 tablet, Rfl: 0 .  meloxicam (MOBIC) 15 MG tablet, Take by mouth., Disp: , Rfl:  .  methylPREDNISolone (MEDROL) 4 MG TBPK tablet, 6 day taper; take as directed on package instructions, Disp: 21 tablet, Rfl: 0 .  metroNIDAZOLE (METROGEL) 0.75 % vaginal gel, Place 1 Applicatorful vaginally 2 (two) times daily., Disp: 70 g, Rfl: 0 .  montelukast (SINGULAIR) 10 MG tablet, TAKE 1 TABLET (10 MG TOTAL) BY MOUTH AT BEDTIME., Disp: 30 tablet, Rfl: 12 .  naproxen (NAPROSYN) 375 MG tablet, TAKE 1 TABLET (375 MG TOTAL) BY MOUTH 2 (TWO) TIMES DAILY WITH A MEAL. (Patient not taking: Reported on 01/20/2019), Disp: 30 tablet, Rfl: 5 .  nortriptyline (PAMELOR) 25 MG capsule, TAKE ONE EVERY DAY AT BEDTIME AND ONE DURING THE DAY AS NEEDED FOR MIGRAINES., Disp: 180 capsule, Rfl: 1 .  pantoprazole (PROTONIX) 40 MG tablet, TAKE 1 TABLET BY  MOUTH TWICE A DAY, Disp: 60 tablet, Rfl: 5 .  tinidazole (TINDAMAX) 500 MG tablet, Take 2 tablets (1,000 mg total) by mouth daily with breakfast., Disp: 10 tablet, Rfl: 0 .  tiotropium (SPIRIVA) 18 MCG inhalation capsule, Place 1 capsule (18 mcg total) into inhaler and inhale daily., Disp: 30 capsule, Rfl: 12 .  zolpidem (AMBIEN) 5 MG tablet, Take 1 tablet (5 mg total) by mouth at bedtime as needed for sleep., Disp: 30 tablet, Rfl: 1  Review of Systems  Constitutional: Negative for fever and weight loss.  Gastrointestinal: Positive for abdominal pain, flatus and nausea. Negative for anorexia, constipation, diarrhea, hematochezia,  melena and vomiting.  Genitourinary: Positive for frequency. Negative for dysuria and hematuria.  Musculoskeletal: Negative for arthralgias and myalgias.  Neurological: Negative for headaches.    Social History   Tobacco Use  . Smoking status: Never Smoker  . Smokeless tobacco: Never Used  Substance Use Topics  . Alcohol use: No      Objective:   There were no vitals taken for this visit. There were no vitals filed for this visit.There is no height or weight on file to calculate BMI.   Physical Exam   Patient is alert and oriented and responsive to questions Engages in conversation with provider. Speaks in full sentences without any pauses without any shortness of breath or distress.   No acute distress on video. Smiles and is pleasant.   No results found for any visits on 02/07/19.     Assessment & Plan      1. Major depressive disorder, single episode, moderate ([place]) She is advised to follow up with her psychiatrist for psych medications and or changes. She denies any behavioral changes.  Recommend discontinuing Ambien if she feels to drowsy with taking at night. She should call her psychiatrist and is advised and will do so.  Discussed ambiens possible side effects and black box warning.   2. Vaginal odor She has not completed treatment she  was given started Tinidazole almost two weeks after prescribed. She has tolerated medication well, had one episode of stomach pain on 02/06/2019 that has since resolved.   3. Bacterial vaginitis Continue Tinidazole if able to tolerate. If stomach pain returns or any other side effects call the office and I recommend patient have an in person evaluation within the next 3-5 days if symptoms of vaginal odor are not resolving. Options are limited due to patients multiple allergies.  Can take with food and Avoid all ALCOHOL advised.  4. Pelvic Pain in Female  Untreated BV as patient started medication later than prescribed, has one episode of cramping , nausea and gas and had bowel movement and was relieved. She will follow up if any symptoms persist or reoccur.   Return in about 1 week (around 02/14/2019), or if symptoms worsen or fail to improve, for at any time for any worsening symptoms.   She declined an in office appointment for evaluation today.  Advised patient call the office or your primary care doctor for an appointment if no improvement within 72 hours or if any symptoms change or worsen at any time  Advised ER or urgent Care if after hours or on weekend. Call 911 for emergency symptoms at any time.Patinet verbalized understanding of all instructions given/reviewed and treatment plan and has no further questions or concerns at this time.      I provided 20 minutes of non-face-to-face time during this encounter.  The entirety of the information documented in the History of Present Illness, Review of Systems and Physical Exam were personally obtained by me. Portions of this information were initially documented by the  Certified Medical Assistant whose name is documented in [place] and reviewed by me for thoroughness and accuracy.  I have personally performed the exam and reviewed the chart and it is accurate to the best of my knowledge.  Haematologist has been used and any errors in dictation  or transcription are unintentional.  Kelby Aline. Lutz, [facility]

## 2019-02-06 NOTE — Telephone Encounter (Signed)
Message from Erin Richards sent at 02/06/2019 9:20 AM EST  Pt states she just picked up the tinidazole (TINDAMAX) 500 MG tablet yesterday, and it is making her stomach hurt, and she is going to the restroom a lot. (normal stool) but hurts to push it out.  Pt states her psychiatrist put her on ambien and escitalopram (LEXAPRO) 10 MG tablet. Pt not sure if the pain is coming from all these together   Pt states she is really calling about symptoms she believes is IBS related. Pt having moderate to severe abd pain. Office visit obtained for tomorrow morning. Pt instructed to seek care at Urgent Care or ED if symptoms worsen or can not wait until appt. Pt has call into psychiatrist to tell them she does not want to take Lexapro, awaiting call back.   Reason for Disposition . [1] MODERATE pain (e.g., interferes with normal activities) AND [2] pain comes and goes (cramps) AND [3] present > 24 hours  (Exception: pain with Vomiting or Diarrhea - see that Guideline)  Answer Assessment - Initial Assessment Questions 1. LOCATION: "Where does it hurt?"      Lower abd 2. RADIATION: "Does the pain shoot anywhere else?" (e.g., chest, back)     Whole abd 3. ONSET: "When did the pain begin?" (e.g., minutes, hours or days ago)      Last night 4. SUDDEN: "Gradual or sudden onset?"     gradually 5. PATTERN "Does the pain come and go, or is it constant?"    - If constant: "Is it getting better, staying the same, or worsening?"      (Note: Constant means the pain never goes away completely; most serious pain is constant and it progresses)     - If intermittent: "How long does it last?" "Do you have pain now?"     (Note: Intermittent means the pain goes away completely between bouts)     Still painful, stabbing pain in lower abd 6. SEVERITY: "How bad is the pain?"  (e.g., Scale 1-10; mild, moderate, or severe)   - MILD (1-3): doesn't interfere with normal activities, abdomen soft and not tender to touch    -  MODERATE (4-7): interferes with normal activities or awakens from sleep, tender to touch    - SEVERE (8-10): excruciating pain, doubled over, unable to do any normal activities      stabbing 7. RECURRENT SYMPTOM: "Have you ever had this type of abdominal pain before?" If so, ask: "When was the last time?" and "What happened that time?"      Has IBS 8. CAUSE: "What do you think is causing the abdominal pain?"     New medicine, Lexapro and ambien 9. RELIEVING/AGGRAVATING FACTORS: "What makes it better or worse?" (e.g., movement, antacids, bowel movement)     Bowel movements 10. OTHER SYMPTOMS: "Has there been any vomiting, diarrhea, constipation, or urine problems?"       Going frequently but normal shape 11. PREGNANCY: "Is there any chance you are pregnant?" "When was your last menstrual period?"       na  Protocols used: ABDOMINAL PAIN - [facility]

## 2019-02-06 NOTE — Telephone Encounter (Signed)
pt states she not going to take the medicatio you ordered for her it made her feel spaced out. and she can not be like that.

## 2019-02-07 ENCOUNTER — Other Ambulatory Visit: Payer: Self-pay

## 2019-02-07 ENCOUNTER — Ambulatory Visit (INDEPENDENT_AMBULATORY_CARE_PROVIDER_SITE_OTHER): Payer: Medicaid Other | Admitting: Adult Health

## 2019-02-07 ENCOUNTER — Encounter: Payer: Self-pay | Admitting: Adult Health

## 2019-02-07 ENCOUNTER — Encounter: Payer: Self-pay | Admitting: *Deleted

## 2019-02-07 ENCOUNTER — Ambulatory Visit: Payer: Self-pay | Admitting: *Deleted

## 2019-02-07 VITALS — Temp 96.9°F | Wt 135.4 lb

## 2019-02-07 DIAGNOSIS — F321 Major depressive disorder, single episode, moderate: Secondary | ICD-10-CM | POA: Diagnosis not present

## 2019-02-07 DIAGNOSIS — F4321 Adjustment disorder with depressed mood: Secondary | ICD-10-CM

## 2019-02-07 DIAGNOSIS — B9689 Other specified bacterial agents as the cause of diseases classified elsewhere: Secondary | ICD-10-CM

## 2019-02-07 DIAGNOSIS — R102 Pelvic and perineal pain: Secondary | ICD-10-CM

## 2019-02-07 DIAGNOSIS — N760 Acute vaginitis: Secondary | ICD-10-CM

## 2019-02-07 DIAGNOSIS — N898 Other specified noninflammatory disorders of vagina: Secondary | ICD-10-CM
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
## 2019-02-07 NOTE — Progress Notes (Signed)
This encounter was created in error - please disregard.

## 2019-02-16 ENCOUNTER — Other Ambulatory Visit: Payer: Self-pay | Admitting: Physician Assistant

## 2019-02-16 ENCOUNTER — Other Ambulatory Visit: Payer: Self-pay | Admitting: Family Medicine

## 2019-02-16 DIAGNOSIS — J301 Allergic rhinitis due to pollen: Secondary | ICD-10-CM

## 2019-02-16 DIAGNOSIS — K219 Gastro-esophageal reflux disease without esophagitis: Secondary | ICD-10-CM

## 2019-02-16 NOTE — Telephone Encounter (Signed)
Requested medication (s) are due for refill today-yes  Requested medication (s) are on the active medication list -yes  Future visit scheduled -no  Last refill: 01/20/19  Notes to clinic: Rx given to patient at last visit with no RF- sent for review of request  Requested Prescriptions  Pending Prescriptions Disp Refills   COMBIVENT RESPIMAT 20-100 MCG/ACT AERS respimat [Pharmacy Med Name: COMBIVENT RESPIMAT 20-100 MCG]  0    Sig: INHALE 1 PUFF INTO THE LUNGS EVERY 4 (FOUR) HOURS AS NEEDED FOR WHEEZING.      Pulmonology:  Combination Products Passed - 02/16/2019  3:58 PM      Passed - Valid encounter within last 12 months    Recent Outpatient Visits           1 week ago Vaginal odor   [place], FNP   3 weeks ago BV (bacterial vaginosis)   [facility], Clearnce Sorrel, [place]   1 month ago Vaginal discharge   [place], [place], PA-C   3 months ago Major depressive disorder, single episode, moderate ([place])   [facility] Birdie Sons, MD   4 months ago Mastitis, right, acute   [facility] Birdie Sons, MD                  Requested Prescriptions  Pending Prescriptions Disp Refills   COMBIVENT RESPIMAT 20-100 MCG/ACT AERS respimat [Pharmacy Med Name: COMBIVENT RESPIMAT 20-100 MCG]  0    Sig: INHALE 1 PUFF INTO THE LUNGS EVERY 4 (FOUR) HOURS AS NEEDED FOR WHEEZING.      Pulmonology:  Combination Products Passed - 02/16/2019  3:58 PM      Passed - Valid encounter within last 12 months    Recent Outpatient Visits           1 week ago Vaginal odor   [facility], Kelby Aline, FNP   3 weeks ago BV (bacterial vaginosis)   [place], Clearnce Sorrel, [place]   1 month ago Vaginal discharge   [place], [place], PA-C   3 months ago Major depressive disorder, single episode, moderate [facility])   [facility] Birdie Sons, MD   4 months ago Mastitis, right, acute   [facility] Birdie Sons, MD

## 2019-02-17 ENCOUNTER — Ambulatory Visit: Payer: Medicaid Other | Admitting: Psychiatry

## 2019-02-27 ENCOUNTER — Telehealth: Payer: Self-pay

## 2019-02-27 DIAGNOSIS — R928 Other abnormal and inconclusive findings on diagnostic imaging of breast: Secondary | ICD-10-CM

## 2019-02-27 NOTE — Telephone Encounter (Signed)
Copied from [place] [phone]. Topic: Referral - Request for Referral >> Feb 27, 2019  1:51 PM Celene Kras wrote: Has patient seen PCP for this complaint? Yes.   *If NO, is insurance requiring patient see PCP for this issue before PCP can refer them? Referral for which specialty: Breast Center Preferred provider/office: [facility] Reason for referral: mammogram

## 2019-02-27 NOTE — Telephone Encounter (Signed)
Please advise if patient is due for follow up mammo? Her last one was 10/14/2018. Results was Bi-Rads CAT 4. Biopsy pathology recommendation: The patient was instructed to return in six months for unilateral RIGHT breast diagnostic mammogram and possible ultrasound.

## 2019-02-28 ENCOUNTER — Telehealth: Payer: Self-pay

## 2019-02-28 DIAGNOSIS — R928 Other abnormal and inconclusive findings on diagnostic imaging of breast: Secondary | ICD-10-CM

## 2019-02-28 NOTE — Telephone Encounter (Signed)
Have place order, she should get call for Erin Richards or from [place] in the next week or so.

## 2019-02-28 NOTE — Telephone Encounter (Signed)
Please order as requested at [facility].

## 2019-02-28 NOTE — Telephone Encounter (Signed)
Copied from [place] [id number]. Topic: General - Other >> Feb 28, 2019  2:32 PM Parke Poisson wrote: Reason for CRM: Per [facility]  uni diagnostic mammogram will need to be TOMO. They will also need an order for limited right breast ultrasound

## 2019-02-28 NOTE — Telephone Encounter (Signed)
Patient advised as below.  

## 2019-02-28 NOTE — Addendum Note (Signed)
Addended by: Birdie Sons on: 02/28/2019 01:37 PM   Modules accepted: Orders

## 2019-03-01 ENCOUNTER — Telehealth: Payer: Self-pay

## 2019-03-01 ENCOUNTER — Other Ambulatory Visit: Payer: Self-pay | Admitting: Psychiatry

## 2019-03-01 DIAGNOSIS — F330 Major depressive disorder, recurrent, mild: Secondary | ICD-10-CM

## 2019-03-02 NOTE — Telephone Encounter (Signed)
Per [place] Pol[facility], patient needs order [id number] and [id number]. Orders placed.

## 2019-03-02 NOTE — Telephone Encounter (Signed)
Error

## 2019-03-02 NOTE — Addendum Note (Signed)
Addended by: Meyer Cory L on: 03/02/2019 11:02 AM   Modules accepted: Orders

## 2019-03-02 NOTE — Addendum Note (Signed)
Addended by: Randal Buba on: 03/02/2019 02:24 PM   Modules accepted: Orders

## 2019-03-02 NOTE — Addendum Note (Signed)
Addended by: Randal Buba on: 03/02/2019 10:43 AM   Modules accepted: Orders

## 2019-03-03 ENCOUNTER — Telehealth: Payer: Self-pay

## 2019-03-03 DIAGNOSIS — F321 Major depressive disorder, single episode, moderate: Secondary | ICD-10-CM

## 2019-03-03 NOTE — Telephone Encounter (Signed)
Copied from [place] [id number]. Topic: General - Inquiry >> Mar 03, 2019 10:45 AM Mathis Bud wrote: Reason for CRM: Patient is requesting nurse to call back regarding a medication she was once on. Medication is Citalopram. Patient would like to be back on medication Call back [phone]

## 2019-03-06 ENCOUNTER — Telehealth: Payer: Self-pay

## 2019-03-06 DIAGNOSIS — N898 Other specified noninflammatory disorders of vagina: Secondary | ICD-10-CM

## 2019-03-06 MED ORDER — CITALOPRAM HYDROBROMIDE 10 MG PO TABS: 10.0000 mg | ORAL_TABLET | Freq: Every day | ORAL | 2 refills | Status: DC
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
## 2019-03-06 NOTE — Telephone Encounter (Signed)
Patient called wanting to let Erin Richards know that she is still having the same vaginal issues she had during her last office visit on 02/07/2019. I advised patient that the office note has that patient should follow up in 1 week if symptoms worsen or fail to improve, or worsening symptoms. Patient declined coming back in for an office visit.  She would like a referral to GYN if Erin Richards agrees with it. Please advise.

## 2019-03-06 NOTE — Addendum Note (Signed)
Addended by: Birdie Sons on: 03/06/2019 12:29 PM   Modules accepted: Orders

## 2019-03-06 NOTE — Telephone Encounter (Signed)
She should be seen if worsening symptoms as referral may take 1-2 weeks. I am ok with a referral to gynecology being placed as well , however do recommend her being seen in office prior.

## 2019-03-06 NOTE — Telephone Encounter (Signed)
Ok, prescription has been sent to her pharmacy

## 2019-03-06 NOTE — Telephone Encounter (Signed)
Patient denies symptoms worsening and is okay with waiting on referral. KW

## 2019-03-07 NOTE — Telephone Encounter (Signed)
Patient advised.

## 2019-03-08 ENCOUNTER — Telehealth: Payer: Self-pay | Admitting: Obstetrics & Gynecology

## 2019-03-08 NOTE — Telephone Encounter (Signed)
[facility] referring for Vaginal odor. Called and left voicemail for patient to call back to be schedule

## 2019-03-08 NOTE — Telephone Encounter (Signed)
Patient scheduled with [facility] 2/8.

## 2019-03-13 ENCOUNTER — Other Ambulatory Visit: Payer: Self-pay

## 2019-03-13 ENCOUNTER — Encounter: Payer: Self-pay | Admitting: Obstetrics and Gynecology

## 2019-03-13 ENCOUNTER — Telehealth: Payer: Self-pay

## 2019-03-13 ENCOUNTER — Telehealth: Payer: Self-pay | Admitting: Family Medicine

## 2019-03-13 ENCOUNTER — Ambulatory Visit (INDEPENDENT_AMBULATORY_CARE_PROVIDER_SITE_OTHER): Payer: Medicaid Other | Admitting: Obstetrics and Gynecology

## 2019-03-13 VITALS — BP 110/60 | Ht <= 58 in | Wt 142.0 lb

## 2019-03-13 DIAGNOSIS — R3915 Urgency of urination: Secondary | ICD-10-CM | POA: Diagnosis not present

## 2019-03-13 DIAGNOSIS — R350 Frequency of micturition: Secondary | ICD-10-CM

## 2019-03-13 DIAGNOSIS — Z113 Encounter for screening for infections with a predominantly sexual mode of transmission: Secondary | ICD-10-CM

## 2019-03-13 DIAGNOSIS — N898 Other specified noninflammatory disorders of vagina: Secondary | ICD-10-CM

## 2019-03-13 LAB — POCT WET PREP WITH KOH
Clue Cells Wet Prep HPF POC: NEGATIVE
KOH Prep POC: NEGATIVE
Trichomonas, UA: NEGATIVE
WBC Wet Prep HPF POC: POSITIVE
Yeast Wet Prep HPF POC: NEGATIVE

## 2019-03-13 LAB — POCT URINALYSIS DIPSTICK
Bilirubin, UA: NEGATIVE
Blood, UA: NEGATIVE
Glucose, UA: NEGATIVE
Ketones, UA: NEGATIVE
Nitrite, UA: NEGATIVE
Protein, UA: NEGATIVE
Spec Grav, UA: 1.02 (ref 1.010–1.025)
pH, UA: 6 (ref 5.0–8.0)

## 2019-03-13 NOTE — Telephone Encounter (Signed)
Sending letter back that patient received from [facility] regarding her breast ultrasounds not being approved.  Note is attached to letter with explanation. Please call patient and advise of what she needs to do.

## 2019-03-13 NOTE — Patient Instructions (Signed)
I value your feedback and entrusting us with your care. If you get a [place] patient survey, I would appreciate you taking the time to let us know about your experience today. Thank you!  As of January 12, 2019, your lab results will be released to your MyChart immediately, before I even have a chance to see them. Please give me time to review them and contact you if there are any abnormalities. Thank you for your patience.  
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
## 2019-03-13 NOTE — Telephone Encounter (Signed)
Do you know anything about Medicaid's denial for this patient to get a ultrasound to follow up of bright breast lesion? the denial letter from 03-07-2019 says there is already an approval code on file and they do not have the results of the study that was already approved. I guess they are referring to the US done  September 3rd, which resulted in a biopsy and radiologist recommendation for follow up ultrasound in 6 months. Maybe they just need the results of the last ultrasound and biopsy.  Thanks!

## 2019-03-13 NOTE — Telephone Encounter (Signed)
Copied from [place]. Topic: Referral - Request for Referral >> Mar 13, 2019  8:59 AM Celene Kras wrote: Has patient seen PCP for this complaint? Yes.   *If NO, is insurance requiring patient see PCP for this issue before PCP can refer them? Referral for which specialty: Mammography  Preferred provider/office: [facility] Reason for referral: Mammogram  Pt called stating that the her insurance is not covering her mammogram because a PA was not placed. Please contact pt.

## 2019-03-15 ENCOUNTER — Encounter: Payer: Self-pay | Admitting: Obstetrics and Gynecology

## 2019-03-15 DIAGNOSIS — N760 Acute vaginitis: Secondary | ICD-10-CM

## 2019-03-15 DIAGNOSIS — B9689 Other specified bacterial agents as the cause of diseases classified elsewhere: Secondary | ICD-10-CM

## 2019-03-15 LAB — URINE CULTURE

## 2019-03-15 NOTE — Progress Notes (Signed)
Pt aware.

## 2019-03-15 NOTE — Progress Notes (Signed)
Pls let pt know C&S neg. Still waiting on vaginal culture results. Thx

## 2019-03-15 NOTE — Progress Notes (Signed)
Called pt, no answer, LVMTRC. 

## 2019-03-16 ENCOUNTER — Telehealth: Payer: Self-pay

## 2019-03-16 ENCOUNTER — Other Ambulatory Visit: Payer: Self-pay | Admitting: Family Medicine

## 2019-03-16 DIAGNOSIS — G8929 Other chronic pain: Secondary | ICD-10-CM

## 2019-03-16 DIAGNOSIS — G43809 Other migraine, not intractable, without status migrainosus: Secondary | ICD-10-CM

## 2019-03-16 DIAGNOSIS — M7742 Metatarsalgia, left foot: Secondary | ICD-10-CM

## 2019-03-16 NOTE — Telephone Encounter (Signed)
Please advise on patient's question about whether you recommend she get the COVID vaccine when it becomes available for her age group. She has several allergies and is concerned about the safety in getting the vaccine.

## 2019-03-16 NOTE — Telephone Encounter (Signed)
Copied from [place]. Topic: Referral - Request for Referral >> Mar 13, 2019  8:59 AM Celene Kras wrote: Has patient seen PCP for this complaint? Yes.   *If NO, is insurance requiring patient see PCP for this issue before PCP can refer them? Referral for which specialty: Mammography  Preferred provider/office: [facility] Reason for referral: Mammogram  Pt called stating that the her insurance is not covering her mammogram because a PA was not placed. Please contact pt. >> Mar 16, 2019 12:39 PM Alanda Slim E wrote: Pt dropped off paper work at the front desk and wanted to know what she needs to do about medicaid not covering another mammogram and they stated due to her one one not having completed results /  Pt also wants to speak with provider about the covid vaccine/ Pt stated she was allergic to something in the flu vaccine and has a lot of allergies and wants to know if Dr. Caryn Section reccomends her taking the vaccine or if she should not/ please advise

## 2019-03-16 NOTE — Telephone Encounter (Signed)
Copied from [place]. Topic: Referral - Request for Referral >> Mar 13, 2019  8:59 AM Celene Kras wrote: Has patient seen PCP for this complaint? Yes.   *If NO, is insurance requiring patient see PCP for this issue before PCP can refer them? Referral for which specialty: Mammography  Preferred provider/office: [facility] Reason for referral: Mammogram  Pt called stating that the her insurance is not covering her mammogram because a PA was not placed. Please contact pt. >> Mar 16, 2019 12:39 PM Alanda Slim E wrote: Pt dropped off paper work at the front desk and wanted to know what she needs to do about medicaid not covering another mammogram and they stated due to her one one not having completed results /  Pt also wants to speak with provider about the covid vaccine/ Pt stated she was allergic to something in the flu vaccine and has a lot of allergies and wants to know if Dr. Caryn Section reccomends her taking the vaccine or if she should not/ please advise

## 2019-03-16 NOTE — Telephone Encounter (Signed)
Erin Richards will usually ask for an order for ultrasounds of both breast. I tried to get an auth for both but they would only approve the right breast

## 2019-03-19 NOTE — Telephone Encounter (Signed)
She was approved for u/s of right breast only, not the left.  which is fine since that the biopsy was on the right

## 2019-03-20 MED ORDER — CLINDAMYCIN PHOSPHATE (1 DOSE) 2 % VA CREA: TOPICAL_CREAM | VAGINAL | 0 refills | Status: DC

## 2019-03-20 NOTE — Telephone Encounter (Signed)
Called pt. And informed of Dr. Maralyn Sago message. Verbalizes understanding.

## 2019-03-20 NOTE — Telephone Encounter (Signed)
Patient notified of appointment date and time. 

## 2019-03-20 NOTE — Telephone Encounter (Signed)
Pt aware of 3 strains BV on One Swab culture, no AV or yeast. Most likely improved with clindamycin but pt can't tolerate oral pills. Rx clindesse (due to MCD coverage) for 1 dose. Cont probiotics, add boric acid supp after clindamycin tx. Dove sens skin soap, no dryer sheets, unscented detergents, condoms. F/u prn. May need maint tx if sx persist.

## 2019-03-20 NOTE — Telephone Encounter (Signed)
LMTCB, okay for PEC to advise patient.  

## 2019-04-04 ENCOUNTER — Encounter: Payer: Self-pay | Admitting: Obstetrics and Gynecology

## 2019-04-04 NOTE — Telephone Encounter (Signed)
Just saw this a 10:00 so figured can wait till you get back

## 2019-04-14 ENCOUNTER — Other Ambulatory Visit: Payer: Self-pay | Admitting: Obstetrics and Gynecology

## 2019-04-14 ENCOUNTER — Ambulatory Visit
Admission: RE | Admit: 2019-04-14 | Discharge: 2019-04-14 | Disposition: A | Discharge status: Home / Self Care | Payer: Medicaid Other | Source: Ambulatory Visit | Attending: Family Medicine | Admitting: Family Medicine

## 2019-04-14 ENCOUNTER — Encounter: Payer: Self-pay | Admitting: Obstetrics and Gynecology

## 2019-04-14 ENCOUNTER — Encounter: Payer: Self-pay | Admitting: Family Medicine

## 2019-04-14 DIAGNOSIS — R928 Other abnormal and inconclusive findings on diagnostic imaging of breast: Secondary | ICD-10-CM | POA: Insufficient documentation

## 2019-04-14 DIAGNOSIS — N760 Acute vaginitis: Secondary | ICD-10-CM

## 2019-04-14 DIAGNOSIS — B9689 Other specified bacterial agents as the cause of diseases classified elsewhere: Secondary | ICD-10-CM

## 2019-04-14 MED ORDER — CLINDAMYCIN PHOSPHATE (1 DOSE) 2 % VA CREA: TOPICAL_CREAM | VAGINAL | 0 refills | Status: DC

## 2019-04-14 NOTE — Telephone Encounter (Signed)
Pt called triage to make sure we received last msgs. She is aware we did and also aware [facility] is out of the office.

## 2019-04-14 NOTE — Progress Notes (Signed)
Rx RF clindesse due to recurrent BV sx, confirmed on culture. Pt cant tolerate oral clindamycin or tindamax, no relief with flagyl. Clindesse covered on MCD, cleocin not covered.

## 2019-04-17 ENCOUNTER — Telehealth: Payer: Self-pay

## 2019-04-17 ENCOUNTER — Other Ambulatory Visit: Payer: Self-pay | Admitting: Family Medicine

## 2019-04-17 DIAGNOSIS — R928 Other abnormal and inconclusive findings on diagnostic imaging of breast: Secondary | ICD-10-CM

## 2019-04-17 NOTE — Telephone Encounter (Signed)
Patient states she went to her follow up mammogram appointment on Friday and they told her she may have to have surgery because she could have cancer. Patient wants to know if Dr. Maralyn Sago office will set up the appointment for her to see the surgeon, or [place]? Patient says she is nervous and would like to be seen asap.

## 2019-04-18 ENCOUNTER — Ambulatory Visit: Payer: Medicaid Other | Admitting: General Surgery

## 2019-04-18 ENCOUNTER — Encounter: Payer: Self-pay | Admitting: General Surgery

## 2019-04-18 ENCOUNTER — Other Ambulatory Visit: Payer: Self-pay

## 2019-04-18 VITALS — BP 95/62 | HR 69 | Temp 96.6°F | Ht <= 58 in | Wt 138.8 lb

## 2019-04-18 DIAGNOSIS — R928 Other abnormal and inconclusive findings on diagnostic imaging of breast: Secondary | ICD-10-CM | POA: Diagnosis not present

## 2019-04-18 NOTE — Progress Notes (Signed)
Patient ID: Erin Richards, female   DOB: Jun 29, 1965, 54 y.o.   MRN: [id number]  Chief Complaint  Patient presents with  . New Patient (Initial Visit)    new pt ref Dr. Lelon Huh right breast cat 4    HPI Erin Richards is a 54 y.o. female.  She has been referred by her primary care provider, Dr. Caryn Section, for further evaluation of an abnormal mammogram.  In September 2020, she underwent routine screening mammogram.  This resulted in a callback and biopsy x3 of a right sided area of architectural distortion.  The biopsies were all consistent with benign process.  She underwent short-term interval mammography this past week, and was told, in her words "you just need to take the whole thing off" by the radiologist.  On review of their documentation, they do suggest surgical excision of the area of abnormal architecture.  She is here today for further discussion of these recommendations.    She reports that the mammogram done last year was her first abnormal mammogram.  As stated previously, the prior biopsies were benign.  Age of menarche was at 11 or 54 years old.  She had 2 pregnancies, first at the age of 76.  She did not breast-feed either child.  She has used both birth control and hormone replacement therapy, but I do not have data for the duration of use for either of these agents.  She says she may have a sister with a history of breast cancer, but then also says that her sister is not necessarily the most truthful individual.  She has undergone a hysterectomy.  She denies any palpable abnormality within her breast.  No skin changes.  She reports that both nipples are inverted and have always been so.  She denies any nipple discharge.  She does endorse some breast tenderness.  She does not always perform monthly breast exams but does do them on occasion.   Past Medical History:  Diagnosis Date  . Allergy   . ASD (atrial septal defect)   . Clostridium difficile colitis 10/07/2014  . Concussion  07/03/2013  . Depression   . Dyspnea   . Headache    migraines  . History of Clostridium difficile colitis 09/24/2015  . History of colitis 09/24/2015  . IBS (irritable bowel syndrome)   . 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 Toe fracture 06/10/2015    Past Surgical History:  Procedure Laterality Date  . ABDOMINAL HYSTERECTOMY    . BREAST BIOPSY Right 10/14/2018   Affirm bx #1 Ribbon clip-Benign breast tissue with dense stromal fibrosis and sclereosing adenosis  . BREAST BIOPSY Right 10/14/2018   Affirm bx #2 Coil clip-benign breast tissue with dense stromal fibrosis and sclerosing  . BREAST BIOPSY Right 10/14/2018   Affirm bx #3 "X" clip- benign breast tissue with dense stromal fibrosis and usual ductal hyplasia.   [facility] Kitchen CARDIAC SURGERY    . COLONOSCOPY WITH PROPOFOL N/A 08/16/2014   Procedure: COLONOSCOPY WITH PROPOFOL;  Surgeon: Manya Silvas, MD;  Location[facility] ENDOSCOPY;  Service: Endoscopy;  Laterality: N/A;  . COLONOSCOPY WITH PROPOFOL N/A 08/27/2017   Procedure: COLONOSCOPY WITH PROPOFOL;  Surgeon: Manya Silvas, MD;  Location: [facility] ENDOSCOPY;  Service: Endoscopy;  Laterality: N/A;  . ESOPHAGOGASTRODUODENOSCOPY (EGD) WITH PROPOFOL  08/16/2014   Procedure: ESOPHAGOGASTRODUODENOSCOPY (EGD) WITH PROPOFOL;  Surgeon: Manya Silvas, MD;  Location: [facility] ENDOSCOPY;  Service: Endoscopy;;  . TOTAL ABDOMINAL HYSTERECTOMY W/ BILATERAL SALPINGOOPHORECTOMY  01/08/2009  supracervical    Family History  Problem Relation Age of Onset  . Heart disease Father   . Cancer Father   . Alcohol abuse Father   . Cancer Sister        pt unsure    Social History Social History   Tobacco Use  . Smoking status: Never Smoker  . Smokeless tobacco: Never Used  Substance Use Topics  . Alcohol use: No  . Drug use: No    Allergies  Allergen Reactions  . Acetaminophen-Codeine Nausea And Vomiting  . Antiseptic Oral Rinse [Cetylpyridinium Chloride] Other (See Comments)     Mouth sores  . Aspartame Other (See Comments)    Reaction: unknown  . Biaxin [Clarithromycin] Nausea And Vomiting  . Carafate [Sucralfate]     Pt says her "Stomach hurts" when she takes it.    . Chlorhexidine Gluconate Nausea And Vomiting  . Clindamycin/Lincomycin Nausea And Vomiting  . Codeine Itching and Nausea And Vomiting  . Dextromethorphan Hbr Other (See Comments)    Reaction: unknown  . Dilaudid [Hydromorphone Hcl] Nausea And Vomiting  . Doxycycline Nausea And Vomiting  . Fentanyl Nausea And Vomiting  . Fluticasone-Salmeterol Other (See Comments)    Blister in mouth Other reaction(s): Other (See Comments) Blister in mouth  . Germanium Other (See Comments)  . Hydrocodone Nausea And Vomiting  . Ketorolac Other (See Comments)  . Levofloxacin Other (See Comments)    GI upset. GI upset  . Mefenamic Acid Nausea And Vomiting  . Metformin And Related Nausea And Vomiting  . Metronidazole Diarrhea and Nausea And Vomiting  . Morphine And Related Nausea And Vomiting  . Moxifloxacin Swelling  . Nitrofurantoin Nausea And Vomiting and Other (See Comments)  . Nsaids Other (See Comments)    Reaction: unknown  . Oxycodone-Acetaminophen Nausea And Vomiting  . Periguard [Dimethicone] Nausea And Vomiting  . Phenothiazines Nausea And Vomiting  . Pioglitazone Nausea And Vomiting  . Quinidine Nausea And Vomiting  . Quinolones Nausea And Vomiting  . Rumex Crispus Other (See Comments)  . Tetracyclines & Related Nausea And Vomiting  . Toradol [Ketorolac Tromethamine] Nausea And Vomiting  . Tramadol Nausea And Vomiting  . Tussin [Guaifenesin] Nausea And Vomiting  . Tussionex Pennkinetic Er [Hydrocod Polst-Cpm Polst Er] Nausea And Vomiting  . Buprenorphine Hcl Nausea And Vomiting  . Lincomycin Hcl Nausea And Vomiting  . Oxycodone-Acetaminophen Hives and Nausea And Vomiting    Other reaction(s): Nausea And Vomiting, Unknown  . Phenylalanine Nausea And Vomiting    Current Outpatient  Medications  Medication Sig Dispense Refill  . albuterol (PROAIR HFA) 108 (90 Base) MCG/ACT inhaler Inhale 1-2 puffs into the lungs every 6 (six) hours as needed for shortness of breath. 8 g 6  . azelastine (ASTELIN) 0.1 % nasal spray Place 1 spray into both nostrils 2 (two) times daily. 30 mL 12  . cetirizine (ZYRTEC) 10 MG tablet Take 1 tablet (10 mg total) by mouth daily. 30 tablet 11  . citalopram (CELEXA) 10 MG tablet Take 1 tablet (10 mg total) by mouth daily. 90 tablet 2  . Clindamycin Phosphate, 1 Dose, vaginal cream Insert cream vaginally for 1 dose 5 g 0  . COMBIVENT RESPIMAT 20-100 MCG/ACT AERS respimat INHALE 1 PUFF INTO THE LUNGS EVERY 4 (FOUR) HOURS AS NEEDED FOR WHEEZING. 4 g 3  . fexofenadine (ALLEGRA) 60 MG tablet Take 1 tablet (60 mg total) by mouth 2 (two) times daily. 60 tablet 4  . fluticasone (FLONASE) 50 MCG/ACT nasal spray Place  2 sprays into both nostrils daily as needed for allergies or rhinitis. Reported on 04/03/2015 16 g 5  . gabapentin (NEURONTIN) 300 MG capsule TAKE 1 CAPSULE (300 MG TOTAL) BY MOUTH DAILY. 90 capsule 4  . hyoscyamine (LEVSIN, ANASPAZ) 0.125 MG tablet TAKE ONE TABLET BY MOUTH EVERY 6 HOURS AS NEEDED FOR CRAMPING FOR UP TO 10 DAYS 30 tablet 5  . ipratropium (ATROVENT) 0.03 % nasal spray Place 2 sprays into the nose daily as needed. 30 mL 4  . loperamide (IMODIUM A-D) 2 MG tablet Take 1 tablet (2 mg total) by mouth 4 (four) times daily as needed for diarrhea or loose stools. 30 tablet 0  . meloxicam (MOBIC) 15 MG tablet Take by mouth.    . montelukast (SINGULAIR) 10 MG tablet TAKE 1 TABLET (10 MG TOTAL) BY MOUTH AT BEDTIME. 30 tablet 12  . naproxen (NAPROSYN) 375 MG tablet TAKE 1 TABLET (375 MG TOTAL) BY MOUTH 2 (TWO) TIMES DAILY WITH A MEAL. 180 tablet 1  . nortriptyline (PAMELOR) 25 MG capsule TAKE ONE EVERY DAY AT BEDTIME AND ONE DURING THE DAY AS NEEDED FOR MIGRAINES. 180 capsule 1  . pantoprazole (PROTONIX) 40 MG tablet TAKE 1 TABLET BY MOUTH TWICE A  DAY 60 tablet 5  . tiotropium (SPIRIVA) 18 MCG inhalation capsule Place 1 capsule (18 mcg total) into inhaler and inhale daily. 30 capsule 12   No current facility-administered medications for this visit.    Review of Systems Review of Systems  All other systems reviewed and are negative.   Blood pressure 95/62, pulse 69, temperature (!) 96.6 F (35.9 C), temperature source Temporal, height 4\' 9"  (1.448 m), weight 138 lb 12.8 oz (63 kg), SpO2 95 %. Body mass index is 30.04 kg/m.  Physical Exam Physical Exam Constitutional:      General: She is not in acute distress.    Appearance: Normal appearance.  HENT:     Head: Normocephalic and atraumatic.     Nose:     Comments: Covered with a mask secondary to COVID-19 precautions    Mouth/Throat:     Comments: Covered with a mask secondary to COVID-19 precautions Eyes:     General: No scleral icterus.       Right eye: No discharge.        Left eye: No discharge.     Conjunctiva/sclera: Conjunctivae normal.  Cardiovascular:     Rate and Rhythm: Normal rate.     Heart sounds: Murmur present.  Pulmonary:     Effort: Pulmonary effort is normal. No respiratory distress.     Breath sounds: Normal breath sounds.  Chest:     Breasts:        Right: Inverted nipple present.        Left: Inverted nipple present.       Comments: Scars from biopsy; palpable area of tissue thickening, most consistent with fibrocystic change.  The area is soft and mobile.  It corresponds on physical examination to the area of concern on radiographic studies.  By palpation, it is approximately 5 cm in greatest dimension. Abdominal:     General: Bowel sounds are normal.     Palpations: Abdomen is soft.  Genitourinary:    Comments: Deferred Musculoskeletal:        General: No swelling or tenderness.  Lymphadenopathy:     Upper Body:     Right upper body: No supraclavicular, axillary or pectoral adenopathy.     Left upper body: No supraclavicular,  axillary  or pectoral adenopathy.  Skin:    General: Skin is warm and dry.  Neurological:     General: No focal deficit present.     Mental Status: She is alert and oriented to person, place, and time.  Psychiatric:        Mood and Affect: Mood normal.        Behavior: Behavior normal.        Thought Content: Thought content normal.     Data Reviewed  I personally reviewed the mammographic images.  The radiologist's impressions are copied here: CLINICAL DATA:  The patient reports focal pain in the LOWER OUTER QUADRANT of the RIGHT breast, not improved after a course of antibiotics.  EXAM: DIGITAL DIAGNOSTIC BILATERAL MAMMOGRAM WITH CAD AND TOMO  ULTRASOUND RIGHT BREAST  COMPARISON:  07/20/2015 and earlier  ACR Breast Density Category c: The breast tissue is heterogeneously dense, which may obscure small masses.  FINDINGS: In the retroareolar region of the RIGHT breast, slightly above the nipple there is a 6 millimeter group of coarse heterogeneous calcifications. Just posterior and superior to these are 2 discrete areas of distortion. These are best seen in the true LATERAL projection and overlap on the craniocaudal projection. Scattered additional calcifications in the anterior portion of the RIGHT breast are stable compared to remote exams. In the area of pain in the LOWER OUTER QUADRANT of the RIGHT breast, spot tangential view shows no abnormality.  LEFT breast negative.  Mammographic images were processed with CAD.  On physical exam, I palpate no abnormality in the UPPER-OUTER QUADRANT of the RIGHT breast.  Targeted ultrasound is performed, showing 2 focal areas of acoustic shadowing. The first is in the 11 o'clock location 5 centimeters from the nipple, estimated to measure 0.6 x 0.3 x 0.4 centimeters. A second area of acoustic shadowing is identified in the 10 o'clock location 5 centimeters from nipple and measures 0.9 x 0.7 x 1.2 centimeters. This  area of shadowing is adjacent to sonographically apparent calcifications, possibly representing a coarse calcification seen mammographically.  A spot tangential view is performed over the second area of concern, confirming that it represents distortion seen mammographically.  Evaluation of the RIGHT axilla is negative for adenopathy.  IMPRESSION: There are 2 focal areas of distortion in the UPPER-OUTER QUADRANT of the RIGHT breast, just above an area of suspicious microcalcifications. These areas are best seen mammographically. Tissue diagnosis of each site is recommended.  No RIGHT axillary adenopathy.  LEFT breast negative.  RECOMMENDATION: Stereotactic guided core biopsy of 2 sites of distortion and a group of calcifications in the UPPER-OUTER QUADRANT of the RIGHT breast. (Three total sites.)  These lesions are best seen on the true LATERAL projection.  I have discussed the findings and recommendations with the patient. If applicable, a reminder letter will be sent to the patient regarding the next appointment.  BI-RADS CATEGORY  4: Suspicious.  I have reviewed the pathology from her prior breast biopsies.  Those results are copied here:  SPECIMEN SUBMITTED:  A. Breast, right, upper outer quadrant  B. Breast, right, upper out quadrant, most sup location  C. Breast, right, upper retro areolar   CLINICAL HISTORY:  For 1 and 2: 2 sites of architectural distortion in the upper outer  quadrant of the right breast. For 3, indeterminate CALS in upper retro  areolar right breast   PRE-OPERATIVE DIAGNOSIS:  For 1 and 2: CSL vs CA. For 3: FCC vs adenosis vs DCIS   POST-OPERATIVE DIAGNOSIS:  Ribbon  and coil clips medially displaced from targeted sites in UOQ (1.4  and 1.8 cm). X clip is displaced 3.6 cm lateral from calcifications.   DIAGNOSIS:  A. RIGHT BREAST, UPPER OUTER QUADRANT; STEREOTACTIC NEEDLE CORE BIOPSY:  - BENIGN BREAST TISSUE WITH DENSE  STROMAL FIBROSIS AND SCLEROSING  ADENOSIS.  - NEGATIVE FOR ATYPIA AND MALIGNANCY.   B. RIGHT BREAST, UPPER OUTER QUADRANT (SUPERIOR); STEREOTACTIC NEEDLE  CORE BIOPSY:  -BENIGN BREAST TISSUE WITH DENSE STROMAL FIBROSIS AND SCLEROSING  ADENOSIS.  - NEGATIVE FOR ATYPIA AND MALIGNANCY.   C. RIGHT BREAST, UPPER RETROAREOLAR; STEREOTACTIC NEEDLE CORE BIOPSY:  - BENIGN BREAST TISSUE WITH DENSE STROMAL FIBROSIS AND USUAL DUCTAL  HYPERPLASIA.  - CALCIFICATIONS ARE NOTED IN ASSOCIATION WITH BENIGN BREAST TISSUE.  - NEGATIVE FOR ATYPIA AND MALIGNANCY.   I then reviewed the mammographic images from her most recent study.  Radiologist impression is copied here:  CLINICAL DATA:  Short-term follow-up from 3 right breast biopsies. The sites from biopsy of architectural distortions revealed stromal fibrosis.  EXAM: DIGITAL DIAGNOSTIC UNILATERAL RIGHT MAMMOGRAM WITH CAD AND TOMO  COMPARISON:  Previous exam(s).  ACR Breast Density Category c: The breast tissue is heterogeneously dense, which may obscure small masses.  FINDINGS: Cc and MLO views of the right breast, spot magnification cc and lateral views of the right breast are submitted. There is a 4.3 x 4.1 x 5 cm area of architectural distortion with coil and ribbon biopsy clip within it in the right breast 12 o'clock position. This is more prominent compared to prior exam.  Mammographic images were processed with CAD.  IMPRESSION: Suspicious findings.  RECOMMENDATION: Recommend surgical excision of the conglomerate of architectural distortion at right breast 12 o'clock.  I have discussed the findings and recommendations with the patient. If applicable, a reminder letter will be sent to the patient regarding the next appointment.  BI-RADS CATEGORY  4: Suspicious.  Assessment This is a 54 year old woman who presents today for follow-up of a BI-RADS 4 mammographic study.  She has had prior biopsies of the area of  concern, all of which were consistent with a benign process.  Apparently, the imaging obtained most recently appears more distorted and prominent than on the study that led to the biopsies.  On physical examination, the area of concern is palpable and feels more consistent with benign fibrocystic breast tissue.  It is freely mobile and quite soft.  Plan I had an extensive discussion with the patient this afternoon regarding the recommendations from radiology.  I discussed with her that certainly, there is a possibility of sampling error from the 3 prior biopsies, such that a potential malignancy could have been missed in this fairly large area of concern.  I also expressed my concern that we do not have a discrete diagnosis of malignancy to drive [place] to perform a fairly substantial resection of breast tissue that would cause asymmetry and cosmetic concerns.  I have recommended that she undergo a an MRI of the breast, which may give [place] additional information.  I also cautioned her that it may be of no additional use and that we would potentially need to proceed with surgery, regardless of the results.  The MRI could guide additional biopsy or confirm that the findings on mammography are more insidious than the prior biopsy results would suggest.  We will get this scheduled and I will contact her once I have the results to make further plans    Fredirick Maudlin 04/18/2019, 2:49 PM

## 2019-04-18 NOTE — Patient Instructions (Addendum)
Dr.Cannon suggested patient to have MRI. Patient will receive a call from [facility] to get scheduled for MRI. If you have any questions or concerns, please give our office a call.   Breast Self-Awareness Breast self-awareness means being familiar with how your breasts look and feel. It involves checking your breasts regularly and reporting any changes to your health care provider. Practicing breast self-awareness is important. Sometimes changes may not be harmful (are benign), but sometimes a change in your breasts can be a sign of a serious medical problem. It is important to learn how to do this procedure correctly so that you can catch problems early, when treatment is more likely to be successful. All women should practice breast self-awareness, including women who have had breast implants. What you need:  A mirror.  A well-lit room. How to do a breast self-exam A breast self-exam is one way to learn what is normal for your breasts and whether your breasts are changing. To do a breast self-exam: Look for changes  1. Remove all the clothing above your waist. 2. Stand in front of a mirror in a room with good lighting. 3. Put your hands on your hips. 4. Push your hands firmly downward. 5. Compare your breasts in the mirror. Look for differences between them (asymmetry), such as: ? Differences in shape. ? Differences in size. ? Puckers, dips, and bumps in one breast and not the other. 6. Look at each breast for changes in the skin, such as: ? Redness. ? Scaly areas. 7. Look for changes in your nipples, such as: ? Discharge. ? Bleeding. ? Dimpling. ? Redness. ? A change in position. Feel for changes Carefully feel your breasts for lumps and changes. It is best to do this while lying on your back on the floor, and again while sitting or standing in the tub or shower with soapy water on your skin. Feel each breast in the following way: 1. Place the arm on the side of the  breast you are examining above your head. 2. Feel your breast with the other hand. 3. Start in the nipple area and make -inch (2 cm) overlapping circles to feel your breast. Use the pads of your three middle fingers to do this. Apply light pressure, then medium pressure, then firm pressure. The light pressure will allow you to feel the tissue closest to the skin. The medium pressure will allow you to feel the tissue that is a little deeper. The firm pressure will allow you to feel the tissue close to the ribs. 4. Continue the overlapping circles, moving downward over the breast until you feel your ribs below your breast. 5. Move one finger-width toward the center of the body. Continue to use the -inch (2 cm) overlapping circles to feel your breast as you move slowly up toward your collarbone. 6. Continue the up-and-down exam using all three pressures until you reach your armpit.  Write down what you find Writing down what you find can help you remember what to discuss with your health care provider. Write down:  What is normal for each breast.  Any changes that you find in each breast, including: ? The kind of changes you find. ? Any pain or tenderness. ? Size and location of any lumps.  Where you are in your menstrual cycle, if you are still menstruating. General tips and recommendations  Examine your breasts every month.  If you are breastfeeding, the best time to examine your breasts is after  a feeding or after using a breast pump.  If you menstruate, the best time to examine your breasts is 5-7 days after your period. Breasts are generally lumpier during menstrual periods, and it may be more difficult to notice changes.  With time and practice, you will become more familiar with the variations in your breasts and more comfortable with the exam. Contact a health care provider if you:  See a change in the shape or size of your breasts or nipples.  See a change in the skin of your  breast or nipples, such as a reddened or scaly area.  Have unusual discharge from your nipples.  Find a lump or thick area that was not there before.  Have pain in your breasts.  Have any concerns related to your breast health. Summary  Breast self-awareness includes looking for physical changes in your breasts, as well as feeling for any changes within your breasts.  Breast self-awareness should be performed in front of a mirror in a well-lit room.  You should examine your breasts every month. If you menstruate, the best time to examine your breasts is 5-7 days after your menstrual period.  Let your health care provider know of any changes you notice in your breasts, including changes in size, changes on the skin, pain or tenderness, or unusual fluid from your nipples. This information is not intended to replace advice given to you by your health care provider. Make sure you discuss any questions you have with your health care provider. Document Revised: 09/07/2017 Document Reviewed: 09/07/2017 Elsevier Patient Education  [place].

## 2019-04-24 ENCOUNTER — Telehealth: Payer: Self-pay

## 2019-04-24 NOTE — Telephone Encounter (Signed)
FYI  I spoke with the patient and discussed some of the requirements involved in obtaining authorization for breast reduction.  After which she told me that she had a huge lump in one of her breasts that she is currently having followed and is scheduled to have MRI of the lump.  She has had previous biopsies of that breast.   I advised that she get this lump addressed and then discuss the breast reduction once she has been cleared of any procedures involving this situation.   Patient verbalized understanding.    Copied from [place] [phone]. Topic: General - Other >> Apr 24, 2019  2:36 PM Rayann Heman wrote: Reason for CRM: pt called and stated that she would like a call from Dr Maralyn Sago nurse regarding a breast reduction. Pt would also like to know if this could be covered under medicaid. Please advise

## 2019-04-25 ENCOUNTER — Telehealth: Payer: Self-pay

## 2019-04-25 NOTE — Telephone Encounter (Signed)
Needs evaluation in office

## 2019-04-25 NOTE — Telephone Encounter (Signed)
Appointment scheduled 04/28/2019 at 8:20am. Patient advised to arrive at 8am.

## 2019-04-25 NOTE — Telephone Encounter (Signed)
Reviewed

## 2019-04-25 NOTE — Telephone Encounter (Signed)
Copied from [place] [phone]. Topic: General - Inquiry >> Apr 25, 2019 12:03 PM Richardo Priest, NT wrote: Reason for CRM: Patient called in stating she is now having pain under her left breast starting yesterday. Pt reports she has something the size of a golf ball in her right breast that she is getting checked out tomorrow and would like advice on what to do in regards to left breast. Patient states it hurts her any time she moves her left arm. Please advise

## 2019-04-27 NOTE — Progress Notes (Signed)
Patient: Erin Richards   DOB: 07-12-1965   54 y.o. Female  MRN: [id number] Visit Date: 04/28/2019  Today's Provider: Lelon Huh, MD  Subjective:    Chief Complaint  Patient presents with  . Breast Pain   HPI Patient presents today C/O a left breast mass and pain for 1 week. Patient denies redness and swelling. She is experiencing sharp pains intermittently. She also reports having cough productive green sputum for the last week. She did have mammogram in September which was normal on the left, but lump on right is being followed by Dr. Celine Ahr.   Past Medical History:  Diagnosis Date  . Allergy   . ASD (atrial septal defect)   . Clostridium difficile colitis 10/07/2014  . Concussion 07/03/2013  . Depression   . Dyspnea   . Headache    migraines  . History of Clostridium difficile colitis 09/24/2015  . History of colitis 09/24/2015  . IBS (irritable bowel syndrome)   . 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 Toe fracture 06/10/2015     Medications: Outpatient Medications Prior to Visit  Medication Sig Dispense Refill  . albuterol (PROAIR HFA) 108 (90 Base) MCG/ACT inhaler Inhale 1-2 puffs into the lungs every 6 (six) hours as needed for shortness of breath. 8 g 6  . azelastine (ASTELIN) 0.1 % nasal spray Place 1 spray into both nostrils 2 (two) times daily. 30 mL 12  . cetirizine (ZYRTEC) 10 MG tablet Take 1 tablet (10 mg total) by mouth daily. 30 tablet 11  . citalopram (CELEXA) 10 MG tablet Take 1 tablet (10 mg total) by mouth daily. 90 tablet 2  . Clindamycin Phosphate, 1 Dose, vaginal cream Insert cream vaginally for 1 dose 5 g 0  . COMBIVENT RESPIMAT 20-100 MCG/ACT AERS respimat INHALE 1 PUFF INTO THE LUNGS EVERY 4 (FOUR) HOURS AS NEEDED FOR WHEEZING. 4 g 3  . fexofenadine (ALLEGRA) 60 MG tablet Take 1 tablet (60 mg total) by mouth 2 (two) times daily. 60 tablet 4  . fluticasone (FLONASE) 50 MCG/ACT nasal spray Place 2 sprays into both nostrils  daily as needed for allergies or rhinitis. Reported on 04/03/2015 16 g 5  . gabapentin (NEURONTIN) 300 MG capsule TAKE 1 CAPSULE (300 MG TOTAL) BY MOUTH DAILY. 90 capsule 4  . hyoscyamine (LEVSIN, ANASPAZ) 0.125 MG tablet TAKE ONE TABLET BY MOUTH EVERY 6 HOURS AS NEEDED FOR CRAMPING FOR UP TO 10 DAYS 30 tablet 5  . ipratropium (ATROVENT) 0.03 % nasal spray Place 2 sprays into the nose daily as needed. 30 mL 4  . loperamide (IMODIUM A-D) 2 MG tablet Take 1 tablet (2 mg total) by mouth 4 (four) times daily as needed for diarrhea or loose stools. 30 tablet 0  . meloxicam (MOBIC) 15 MG tablet Take by mouth.    . montelukast (SINGULAIR) 10 MG tablet TAKE 1 TABLET (10 MG TOTAL) BY MOUTH AT BEDTIME. 30 tablet 12  . naproxen (NAPROSYN) 375 MG tablet TAKE 1 TABLET (375 MG TOTAL) BY MOUTH 2 (TWO) TIMES DAILY WITH A MEAL. 180 tablet 1  . nortriptyline (PAMELOR) 25 MG capsule TAKE ONE EVERY DAY AT BEDTIME AND ONE DURING THE DAY AS NEEDED FOR MIGRAINES. 180 capsule 1  . pantoprazole (PROTONIX) 40 MG tablet TAKE 1 TABLET BY MOUTH TWICE A DAY 60 tablet 5  . tiotropium (SPIRIVA) 18 MCG inhalation capsule Place 1 capsule (18 mcg total) into inhaler and inhale daily. 30 capsule  12   No facility-administered medications prior to visit.    Review of Systems  Constitutional: Negative for appetite change, chills, fatigue and fever.  Respiratory: Negative for chest tightness and shortness of breath.   Cardiovascular: Negative for chest pain and palpitations.  Gastrointestinal: Negative for abdominal pain, nausea and vomiting.  Neurological: Negative for dizziness and weakness.        Objective:   BP (!) 96/58 (BP Location: Right Arm, Patient Position: Sitting, Cuff Size: Normal)   Pulse 65   Temp (!) 97.3 F (36.3 C) (Temporal)   Wt 136 lb 6.4 oz (61.9 kg)   BMI 29.52 kg/m  Vitals:   04/28/19 0802  BP: (!) 96/58  Pulse: 65  Temp: (!) 97.3 F (36.3 C)  TempSrc: Temporal  Weight: 136 lb 6.4 oz (61.9 kg)      Physical Exam  No discrete left breast mass. No tenderness of the breast tissue, but tender to deep palpation of anterior chest well underneath left upper-outer breast. Pain is reproduced with arm movement and deep breathing.   No results found for any visits on 04/28/19.    Assessment & Plan:    1. Costochondritis, acute(suspected) start- naproxen (NAPROSYN) 500 MG tablet; Take 1 tablet (500 mg total) by mouth 2 (two) times daily with a meal for 10 days.  Dispense: 20 tablet; Refill: 0  No discrete breast masses or breast tissue masses. She is followed by Dr. Celine Ahr for abnormality on mammogram of right breast. She was advised to call back or discuss with Dr. Celine Ahr if pain does not resolve with naprosyn.   2. Cough May be contributing to costochonditis.  - cefdinir (OMNICEF) 300 MG capsule; Take 1 capsule (300 mg total) by mouth 2 (two) times daily for 10 days.  Dispense: 20 capsule; Refill: 0  3. Bronchitis  - cefdinir (OMNICEF) 300 MG capsule; Take 1 capsule (300 mg total) by mouth 2 (two) times daily for 10 days.  Dispense: 20 capsule; Refill: 0  4. Chronic bilateral low back pain without sciatica She has had low and mid back for years and followed by chiropractor who she states told her she would likely benefit from breast reduction surgery. Will refer to plastic surgery for evaluation.     The entirety of the information documented in the History of Present Illness, Review of Systems and Physical Exam were personally obtained by me. Portions of this information were initially documented by Idelle Jo, CMA and reviewed by me for thoroughness and accuracy.   Lelon Huh, MD  [facility] [phone] (phone) [phone] (fax)  [place]

## 2019-04-28 ENCOUNTER — Other Ambulatory Visit: Payer: Self-pay

## 2019-04-28 ENCOUNTER — Encounter: Payer: Self-pay | Admitting: Family Medicine

## 2019-04-28 ENCOUNTER — Ambulatory Visit (INDEPENDENT_AMBULATORY_CARE_PROVIDER_SITE_OTHER): Payer: Medicaid Other | Admitting: Family Medicine

## 2019-04-28 VITALS — BP 96/58 | HR 65 | Temp 97.3°F | Wt 136.4 lb

## 2019-04-28 DIAGNOSIS — R05 Cough: Secondary | ICD-10-CM | POA: Diagnosis not present

## 2019-04-28 DIAGNOSIS — M545 Low back pain, unspecified: Secondary | ICD-10-CM

## 2019-04-28 DIAGNOSIS — J40 Bronchitis, not specified as acute or chronic: Secondary | ICD-10-CM

## 2019-04-28 DIAGNOSIS — M940 Chondrocostal junction syndrome [Tietze]: Secondary | ICD-10-CM | POA: Diagnosis not present

## 2019-04-28 DIAGNOSIS — G8929 Other chronic pain: Secondary | ICD-10-CM

## 2019-04-28 DIAGNOSIS — R059 Cough, unspecified: Secondary | ICD-10-CM

## 2019-04-28 MED ORDER — CEFDINIR 300 MG PO CAPS: 300.0000 mg | ORAL_CAPSULE | Freq: Two times a day (BID) | ORAL | 0 refills | Status: AC

## 2019-04-28 MED ORDER — NAPROXEN 500 MG PO TABS: 500.0000 mg | ORAL_TABLET | Freq: Two times a day (BID) | ORAL | 0 refills | Status: AC

## 2019-04-28 NOTE — Patient Instructions (Signed)
.   Please review the attached list of medications and notify my office if there are any errors.   . Please bring all of your medications to every appointment so we can make sure that our medication list is the same as yours.   

## 2019-05-01 ENCOUNTER — Other Ambulatory Visit: Payer: Self-pay

## 2019-05-01 ENCOUNTER — Ambulatory Visit
Admission: RE | Admit: 2019-05-01 | Discharge: 2019-05-01 | Disposition: A | Discharge status: Home / Self Care | Payer: Medicaid Other | Source: Ambulatory Visit | Attending: General Surgery | Admitting: General Surgery

## 2019-05-01 ENCOUNTER — Ambulatory Visit: Payer: Medicaid Other

## 2019-05-01 DIAGNOSIS — R928 Other abnormal and inconclusive findings on diagnostic imaging of breast: Secondary | ICD-10-CM | POA: Insufficient documentation

## 2019-05-01 MED ORDER — GADOBUTROL 1 MMOL/ML IV SOLN
6.0000 mL | Freq: Once | INTRAVENOUS | Status: AC | PRN
  Administered 2019-05-01: 6 mL via INTRAVENOUS

## 2019-05-02 ENCOUNTER — Telehealth: Payer: Self-pay | Admitting: General Surgery

## 2019-05-02 ENCOUNTER — Other Ambulatory Visit: Payer: Self-pay | Admitting: General Surgery

## 2019-05-02 ENCOUNTER — Telehealth: Payer: Self-pay | Admitting: Emergency Medicine

## 2019-05-02 DIAGNOSIS — R9389 Abnormal findings on diagnostic imaging of other specified body structures: Secondary | ICD-10-CM

## 2019-05-02 NOTE — Telephone Encounter (Signed)
Patient called requesting more details after receiving word that another biopsy was needed/being ordered after her recent MRI.  The pt states she does not understand the need for yet another bx instead of moving fwd w/surgery @ this point.  Please advise by calling back @ [phone].  Thank you

## 2019-05-02 NOTE — Telephone Encounter (Signed)
-----   Message from Fredirick Maudlin, MD sent at 05/02/2019 10:45 AM EDT ----- Please schedule her for an MRI-guided biopsy please. Thanks! --Lavella Hammock

## 2019-05-02 NOTE — Telephone Encounter (Signed)
Per Dr Celine Ahr pt is to be schedule for MRI guided breast bx Right breast.  Pt is scheduled for 05/05/19 at 8:50am. Arrival time 8am at the [facility] ([place]). Anderson Malta from The imaging center will call pt the day before to remind her of appt and further instructions.  Called pt to make her aware of appt. Voiced understanding.

## 2019-05-02 NOTE — Telephone Encounter (Signed)
Per Dr Celine Ahr, radiologist is recommending pt to have bx done. She states that the previous bx were benign but this MRI study (05/01/19) showed a suspicious mass that recommended bx. Pt is not wanting to do another bx if its just going to come back. Dr Celine Ahr made aware of this. Per Dr Celine Ahr she is not comfortable during a lumpectomy without this bx and recommended pt seek a second opinion if refusing bx. Spoke to pt, states she will have the bx done. No further questions at this time.

## 2019-05-05 ENCOUNTER — Ambulatory Visit
Admission: RE | Admit: 2019-05-05 | Discharge: 2019-05-05 | Disposition: A | Discharge status: Home / Self Care | Payer: Medicaid Other | Source: Ambulatory Visit | Attending: General Surgery | Admitting: General Surgery

## 2019-05-05 ENCOUNTER — Other Ambulatory Visit: Payer: Self-pay

## 2019-05-05 ENCOUNTER — Other Ambulatory Visit (HOSPITAL_COMMUNITY): Payer: Self-pay | Admitting: Diagnostic Radiology

## 2019-05-05 DIAGNOSIS — R9389 Abnormal findings on diagnostic imaging of other specified body structures: Secondary | ICD-10-CM

## 2019-05-05 HISTORY — PX: BREAST BIOPSY: SHX20

## 2019-05-05 MED ORDER — GADOBUTROL 1 MMOL/ML IV SOLN
6.0000 mL | Freq: Once | INTRAVENOUS | Status: AC | PRN
  Administered 2019-05-05: 6 mL via INTRAVENOUS

## 2019-05-08 ENCOUNTER — Telehealth: Payer: Self-pay | Admitting: General Surgery

## 2019-05-08 NOTE — Telephone Encounter (Signed)
Discussed biopsy result w/pt. Benign. Cannot justify surgical resection (partial mastectomy) in light of multiple benign results.  Patient expressed understanding.

## 2019-05-10 ENCOUNTER — Other Ambulatory Visit: Payer: Self-pay | Admitting: Psychiatry

## 2019-05-10 DIAGNOSIS — F330 Major depressive disorder, recurrent, mild: Secondary | ICD-10-CM

## 2019-05-29 ENCOUNTER — Other Ambulatory Visit: Payer: Self-pay

## 2019-05-29 ENCOUNTER — Ambulatory Visit: Payer: Medicaid Other | Admitting: Dermatology

## 2019-05-29 DIAGNOSIS — L988 Other specified disorders of the skin and subcutaneous tissue: Secondary | ICD-10-CM

## 2019-05-29 DIAGNOSIS — L905 Scar conditions and fibrosis of skin: Secondary | ICD-10-CM | POA: Diagnosis not present

## 2019-05-29 DIAGNOSIS — L814 Other melanin hyperpigmentation: Secondary | ICD-10-CM

## 2019-05-29 NOTE — Progress Notes (Signed)
   Follow-Up Visit   Subjective  Erin Richards is a 54 y.o. female who presents for the following: Irritated scar (R inf jaw, 6wk f/u, IL injection of kenalog, improved per pt) and recheck bx site (R inf chin, bx proven dermal nevus irritated, improved).  Has questions about best skin care products to prevent wrinkles.   The following portions of the chart were reviewed this encounter and updated as appropriate:      Review of Systems:  No other skin or systemic complaints except as noted in HPI or Assessment and Plan.  Objective  Well appearing patient in no apparent distress; mood and affect are within normal limits.  A focused examination was performed including face. Relevant physical exam findings are noted in the Assessment and Plan.  Objective  face: Rhytides and volume loss.   Objective  R inf chin, R inf jaw: Pink round macules consistent with scars R inf chin and jaw.  No induration   Assessment & Plan   Lentigines - Scattered tan macules - Discussed due to sun exposure - Benign, observe - Call for any changes   Elastosis of skin face  Discussed broad spectrum SPF 30 or greater.  Discussed the Perfect A cream or pt can continue the otc Retinol.  Scar R inf chin, R inf jaw  Benign, observe.  R inf jaw scar improved from IL kenalog injection last visit.  Return if symptoms worsen or fail to improve.    Documentation: I have reviewed the above documentation for accuracy and completeness, and I agree with the above.  Brendolyn Patty, MD   I, Othelia Pulling, RMA, am acting as scribe for Brendolyn Patty, MD .

## 2019-06-03 ENCOUNTER — Encounter: Payer: Self-pay | Admitting: Emergency Medicine

## 2019-06-03 ENCOUNTER — Other Ambulatory Visit: Payer: Self-pay

## 2019-06-03 ENCOUNTER — Other Ambulatory Visit: Payer: Self-pay | Admitting: Family Medicine

## 2019-06-03 ENCOUNTER — Emergency Department: Payer: Medicaid Other

## 2019-06-03 ENCOUNTER — Emergency Department
Admission: EM | Admit: 2019-06-03 | Discharge: 2019-06-03 | Disposition: A | Discharge status: Home / Self Care | Payer: Medicaid Other | Attending: Emergency Medicine | Admitting: Emergency Medicine

## 2019-06-03 DIAGNOSIS — R300 Dysuria: Secondary | ICD-10-CM | POA: Diagnosis present

## 2019-06-03 DIAGNOSIS — J301 Allergic rhinitis due to pollen: Secondary | ICD-10-CM

## 2019-06-03 DIAGNOSIS — N3090 Cystitis, unspecified without hematuria: Secondary | ICD-10-CM | POA: Diagnosis not present

## 2019-06-03 DIAGNOSIS — R319 Hematuria, unspecified: Secondary | ICD-10-CM | POA: Insufficient documentation

## 2019-06-03 DIAGNOSIS — Z79899 Other long term (current) drug therapy: Secondary | ICD-10-CM | POA: Insufficient documentation

## 2019-06-03 DIAGNOSIS — R1030 Lower abdominal pain, unspecified: Secondary | ICD-10-CM | POA: Insufficient documentation

## 2019-06-03 HISTORY — DX: COVID-19: U07.1

## 2019-06-03 LAB — URINALYSIS, COMPLETE (UACMP) WITH MICROSCOPIC
Bilirubin Urine: NEGATIVE
Glucose, UA: NEGATIVE mg/dL
Ketones, ur: NEGATIVE mg/dL
Nitrite: NEGATIVE
Protein, ur: 100 mg/dL — AB
RBC / HPF: >50 RBC/hpf — ABNORMAL HIGH (ref 0–5)
Specific Gravity, Urine: 1.009 (ref 1.005–1.030)
WBC, UA: >50 WBC/hpf — ABNORMAL HIGH (ref 0–5)
pH: 5 (ref 5.0–8.0)

## 2019-06-03 LAB — CBC
HCT: 39.5 % (ref 36.0–46.0)
Hemoglobin: 12.6 g/dL (ref 12.0–15.0)
MCH: 26.1 pg (ref 26.0–34.0)
MCHC: 31.9 g/dL (ref 30.0–36.0)
MCV: 82 fL (ref 80.0–100.0)
Platelets: 144 10*3/uL — ABNORMAL LOW (ref 150–400)
RBC: 4.82 MIL/uL (ref 3.87–5.11)
RDW: 14.3 % (ref 11.5–15.5)
WBC: 6.4 10*3/uL (ref 4.0–10.5)
nRBC: 0 % (ref 0.0–0.2)

## 2019-06-03 LAB — BASIC METABOLIC PANEL
Anion gap: 6 (ref 5–15)
BUN: 10 mg/dL (ref 6–20)
CO2: 27 mmol/L (ref 22–32)
Calcium: 9.2 mg/dL (ref 8.9–10.3)
Chloride: 104 mmol/L (ref 98–111)
Creatinine, Ser: 0.86 mg/dL (ref 0.44–1.00)
GFR calc Af Amer: >60 mL/min (ref >60)
GFR calc non Af Amer: >60 mL/min (ref >60)
Glucose, Bld: 92 mg/dL (ref 70–99)
Potassium: 4 mmol/L (ref 3.5–5.1)
Sodium: 137 mmol/L (ref 135–145)

## 2019-06-03 MED ORDER — KETOROLAC TROMETHAMINE 30 MG/ML IJ SOLN
30.0000 mg | Freq: Once | INTRAMUSCULAR | Status: AC
  Administered 2019-06-03: 16:00:00 30 mg via INTRAVENOUS
  Filled 2019-06-03: qty 1

## 2019-06-03 MED ORDER — SODIUM CHLORIDE 0.9 % IV BOLUS
1000.0000 mL | Freq: Once | INTRAVENOUS | Status: AC
  Administered 2019-06-03: 1000 mL via INTRAVENOUS

## 2019-06-03 MED ORDER — CEPHALEXIN 500 MG PO CAPS: 500.0000 mg | ORAL_CAPSULE | Freq: Three times a day (TID) | ORAL | 0 refills | Status: DC

## 2019-06-03 MED ORDER — SODIUM CHLORIDE 0.9% FLUSH: 3.0000 mL | Freq: Once | INTRAVENOUS | Status: DC

## 2019-06-03 MED ORDER — ONDANSETRON HCL 4 MG/2ML IJ SOLN
INTRAMUSCULAR | Status: AC
  Filled 2019-06-03: qty 2

## 2019-06-03 MED ORDER — ONDANSETRON HCL 4 MG/2ML IJ SOLN
4.0000 mg | Freq: Once | INTRAMUSCULAR | Status: AC | PRN
  Administered 2019-06-03: 4 mg via INTRAVENOUS

## 2019-06-03 MED ORDER — SODIUM CHLORIDE 0.9 % IV SOLN
1.0000 g | Freq: Once | INTRAVENOUS | Status: AC
  Administered 2019-06-03: 1 g via INTRAVENOUS
  Filled 2019-06-03: qty 10

## 2019-06-03 MED ORDER — ESTROGENS, CONJUGATED 0.625 MG/GM VA CREA: 0.5000 g | TOPICAL_CREAM | Freq: Every day | VAGINAL | 1 refills | Status: DC

## 2019-06-03 NOTE — ED Triage Notes (Signed)
Pt states that she had onset of lower abd pain with dysuria and hematuria that started this am, states that she feels like she is being ripped open

## 2019-06-03 NOTE — ED Notes (Signed)
Pt verbalized understanding of discharge instructions. NAD at this time. 

## 2019-06-03 NOTE — Discharge Instructions (Signed)
Take Keflex three times daily for the next week.  

## 2019-06-03 NOTE — Telephone Encounter (Signed)
Requested Prescriptions  Pending Prescriptions Disp Refills  . COMBIVENT RESPIMAT 20-100 MCG/ACT AERS respimat [Pharmacy Med Name: COMBIVENT RESPIMAT 20-100 MCG] 4 g 3    Sig: INHALE 1 PUFF INTO THE LUNGS EVERY 4 (FOUR) HOURS AS NEEDED FOR WHEEZING.     Pulmonology:  Combination Products Passed - 06/03/2019  9:33 AM      Passed - Valid encounter within last 12 months    Recent Outpatient Visits          1 month ago Costochondritis, acute   [facility] Birdie Sons, MD   3 months ago Vaginal odor   [facility] [place], Kelby Aline, FNP   4 months ago BV (bacterial vaginosis)   [facility], Clearnce Sorrel, [place]   4 months ago Vaginal discharge   [place], [place], PA-C   7 months ago Major depressive disorder, single episode, moderate [facility])   [facility] Caryn Section, Kirstie Peri, MD
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
## 2019-06-05 ENCOUNTER — Telehealth: Payer: Self-pay

## 2019-06-05 NOTE — Progress Notes (Signed)
Established patient visit   Patient: Erin Richards   DOB: 20-Aug-1965   54 y.o. Female  MRN: [id number] Visit Date: 06/06/2019  Today's healthcare provider: Lelon Huh, MD   Chief Complaint  Patient presents with  . Follow-up   Subjective    HPI Follow up ER visit  Patient was seen in ER for abdominal pain, dysuria, and hematuria on 06/03/2019. She was treated for cystitis. Treatment for this included started Keflex 500 mg & Estrogen 0.625 mg cream for atrophic vaginitis thought to be contributing to recurrent UTIs. Urine cultures have grown e.coli sensitive to cephalosporins.  She reports good compliance with treatment. She reports this condition is Improved. Patient is still having pale red discharge and urinary frequency. She states she feels much better since starting antibiotic, but is still a little sore  She also states she has had more trouble hearing and feels like her ears are clogged. She has had cerumen impactions in the past requiring irrigation.   -----------------------------------------------------------------------------------------     Medications: Outpatient Medications Prior to Visit  Medication Sig  . albuterol (PROAIR HFA) 108 (90 Base) MCG/ACT inhaler Inhale 1-2 puffs into the lungs every 6 (six) hours as needed for shortness of breath.  Marland Kitchen azelastine (ASTELIN) 0.1 % nasal spray Place 1 spray into both nostrils 2 (two) times daily.  . cephALEXin (KEFLEX) 500 MG capsule Take 1 capsule (500 mg total) by mouth 3 (three) times daily for 7 days.  . cetirizine (ZYRTEC) 10 MG tablet Take 1 tablet (10 mg total) by mouth daily.  . citalopram (CELEXA) 10 MG tablet Take 1 tablet (10 mg total) by mouth daily.  . Clindamycin Phosphate, 1 Dose, vaginal cream Insert cream vaginally for 1 dose  . COMBIVENT RESPIMAT 20-100 MCG/ACT AERS respimat INHALE 1 PUFF INTO THE LUNGS EVERY 4 (FOUR) HOURS AS NEEDED FOR WHEEZING.  Marland Kitchen conjugated estrogens (PREMARIN) vaginal cream  Place [id number] Applicatorfuls vaginally daily.  . fexofenadine (ALLEGRA) 60 MG tablet Take 1 tablet (60 mg total) by mouth 2 (two) times daily.  . fluticasone (FLONASE) 50 MCG/ACT nasal spray Place 2 sprays into both nostrils daily as needed for allergies or rhinitis. Reported on 04/03/2015  . gabapentin (NEURONTIN) 300 MG capsule TAKE 1 CAPSULE (300 MG TOTAL) BY MOUTH DAILY.  . hyoscyamine (LEVSIN, ANASPAZ) 0.125 MG tablet TAKE ONE TABLET BY MOUTH EVERY 6 HOURS AS NEEDED FOR CRAMPING FOR UP TO 10 DAYS  . ipratropium (ATROVENT) 0.03 % nasal spray Place 2 sprays into the nose daily as needed.  . loperamide (IMODIUM A-D) 2 MG tablet Take 1 tablet (2 mg total) by mouth 4 (four) times daily as needed for diarrhea or loose stools.  . meloxicam (MOBIC) 15 MG tablet Take by mouth.  . montelukast (SINGULAIR) 10 MG tablet TAKE 1 TABLET (10 MG TOTAL) BY MOUTH AT BEDTIME.  . nortriptyline (PAMELOR) 25 MG capsule TAKE ONE EVERY DAY AT BEDTIME AND ONE DURING THE DAY AS NEEDED FOR MIGRAINES.  Marland Kitchen pantoprazole (PROTONIX) 40 MG tablet TAKE 1 TABLET BY MOUTH TWICE A DAY  . tiotropium (SPIRIVA) 18 MCG inhalation capsule Place 1 capsule (18 mcg total) into inhaler and inhale daily.   No facility-administered medications prior to visit.    Review of Systems  Constitutional: Negative for appetite change, chills, fatigue and fever.  Respiratory: Negative for chest tightness and shortness of breath.   Cardiovascular: Negative for chest pain and palpitations.  Gastrointestinal: Negative for abdominal pain, nausea and vomiting.  Genitourinary: Positive  for frequency.  Neurological: Negative for dizziness and weakness.      Objective    BP (!) 108/56 (BP Location: Right Arm, Patient Position: Sitting, Cuff Size: Normal)   Pulse 67   Temp (!) 97.5 F (36.4 C) (Temporal)   Resp 18   Wt 143 lb (64.9 kg)   SpO2 98% Comment: room air  BMI 30.94 kg/m    Physical Exam  General Appearance:    Mildly obese female,  alert, cooperative, in no acute distress  Ears:    Minimal cerumen, no obstruction      Lungs:     Clear to auscultation bilaterally, respirations unlabored  Abdomen:   soft, round, slight suprapubic tenderness. No CVA tenderness     No results found for any visits on 06/06/19.  Assessment & Plan     1. Urinary tract infection with hematuria, site unspecified (recurrent) Greatly improved on cephalexin started 2 days ago. Is to call if sx not completely resolved when finished with antibiotic.   2. Atrophic vaginitis Recently started estrogen cream.   3. Allergic rhinitis due to pollen, unspecified seasonality refill - azelastine (ASTELIN) 0.1 % nasal spray; Place 1 spray into both nostrils 2 (two) times daily.  Dispense: 30 mL; Refill: 12 - ipratropium (ATROVENT) 0.03 % nasal spray; Place 2 sprays into the nose daily as needed.  Dispense: 30 mL; Refill: 4  - fluticasone (FLONASE) 50 MCG/ACT nasal spray; Place 2 sprays into both nostrils daily as needed for allergies or rhinitis. Reported on 04/03/2015  Dispense: 16 g; Refill: 5  5. Hearing difficulty of both ears Ear canals are clear today. She request further evaluation.  - Ambulatory referral to Audiology   No follow-ups on file.      The entirety of the information documented in the History of Present Illness, Review of Systems and Physical Exam were personally obtained by me. Portions of this information were initially documented by the CMA and reviewed by me for thoroughness and accuracy.      Lelon Huh, MD  [facility] [phone] (phone) [phone] (fax)  [place]

## 2019-06-05 NOTE — Telephone Encounter (Signed)
Ok to open up a 11:20 slot for her.

## 2019-06-05 NOTE — Telephone Encounter (Signed)
Copied from [place] [id number]. Topic: Appointment Scheduling - Scheduling Inquiry for Clinic >> Jun 05, 2019  9:08 AM [name] wrote: Reason for CRM: pt went to ED on 5/1.  Pt needs a hospital follow up appt with her dr. [name] for virtual per pt.

## 2019-06-05 NOTE — Telephone Encounter (Signed)
Patient scheduled for 5/4 at 11:20 per Dr. Caryn Section

## 2019-06-06 ENCOUNTER — Other Ambulatory Visit: Payer: Self-pay

## 2019-06-06 ENCOUNTER — Encounter: Payer: Self-pay | Admitting: Family Medicine

## 2019-06-06 ENCOUNTER — Ambulatory Visit (INDEPENDENT_AMBULATORY_CARE_PROVIDER_SITE_OTHER): Payer: Medicaid Other | Admitting: Family Medicine

## 2019-06-06 VITALS — BP 108/56 | HR 67 | Temp 97.5°F | Resp 18 | Wt 143.0 lb

## 2019-06-06 DIAGNOSIS — H9193 Unspecified hearing loss, bilateral: Secondary | ICD-10-CM

## 2019-06-06 DIAGNOSIS — N390 Urinary tract infection, site not specified: Secondary | ICD-10-CM | POA: Diagnosis not present

## 2019-06-06 DIAGNOSIS — N952 Postmenopausal atrophic vaginitis: Secondary | ICD-10-CM | POA: Diagnosis not present

## 2019-06-06 DIAGNOSIS — J301 Allergic rhinitis due to pollen: Secondary | ICD-10-CM

## 2019-06-06 DIAGNOSIS — R319 Hematuria, unspecified: Secondary | ICD-10-CM

## 2019-06-06 DIAGNOSIS — J302 Other seasonal allergic rhinitis: Secondary | ICD-10-CM

## 2019-06-06 LAB — URINE CULTURE: Culture: >=100000 — AB

## 2019-06-06 MED ORDER — FLUTICASONE PROPIONATE 50 MCG/ACT NA SUSP: 2.0000 | Freq: Every day | NASAL | 5 refills | Status: DC | PRN

## 2019-06-06 MED ORDER — AZELASTINE HCL 0.1 % NA SOLN: 1.0000 | Freq: Two times a day (BID) | NASAL | 12 refills | Status: DC

## 2019-06-06 MED ORDER — IPRATROPIUM BROMIDE 0.03 % NA SOLN: 2.0000 | Freq: Every day | NASAL | 4 refills | Status: DC | PRN

## 2019-06-06 NOTE — Patient Instructions (Addendum)
.   Please review the attached list of medications and notify my office if there are any errors.   . Use the estrogen cream every day for a month, then you can cut back to twice a week   You can use OTC Debrox ear drops once or twice a week to keep ear wax from building up

## 2019-06-07 ENCOUNTER — Other Ambulatory Visit: Payer: Self-pay

## 2019-06-07 ENCOUNTER — Ambulatory Visit: Payer: Medicaid Other | Admitting: Plastic Surgery

## 2019-06-07 ENCOUNTER — Encounter: Payer: Self-pay | Admitting: Plastic Surgery

## 2019-06-07 VITALS — BP 100/64 | HR 65 | Temp 97.8°F | Ht <= 58 in | Wt 141.6 lb

## 2019-06-07 DIAGNOSIS — M546 Pain in thoracic spine: Secondary | ICD-10-CM | POA: Diagnosis not present

## 2019-06-07 DIAGNOSIS — N62 Hypertrophy of breast: Secondary | ICD-10-CM | POA: Diagnosis not present

## 2019-06-07 DIAGNOSIS — M4004 Postural kyphosis, thoracic region: Secondary | ICD-10-CM | POA: Diagnosis not present

## 2019-06-07 DIAGNOSIS — M545 Low back pain, unspecified: Secondary | ICD-10-CM
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
## 2019-06-07 NOTE — Addendum Note (Signed)
Addended by: Randal Buba on: 06/07/2019 03:39 PM   Modules accepted: Orders

## 2019-06-09 ENCOUNTER — Telehealth: Payer: Self-pay | Admitting: Family Medicine

## 2019-06-09 MED ORDER — CEPHALEXIN 500 MG PO CAPS: 500.0000 mg | ORAL_CAPSULE | Freq: Three times a day (TID) | ORAL | 0 refills | Status: AC

## 2019-06-09 NOTE — Telephone Encounter (Signed)
Pt saw Dr Perley Jain , 5/04, for UTI.  She states Dr Caryn Section told her if she did not get better, to call in for another round of abx.   Pt states sh is still having some discomfort and pain and would like Rx sent in to  [id number] - [place], [place] - [address] Phone:  [phone]  Fax:  [phone]

## 2019-06-16 ENCOUNTER — Telehealth: Payer: Self-pay | Admitting: Plastic Surgery

## 2019-06-16 NOTE — Telephone Encounter (Signed)
Left message on voicemail for patient to return my call. Medicaid has denied the breast reduction request due to not meeting medical necessity.

## 2019-06-19 ENCOUNTER — Other Ambulatory Visit: Payer: Self-pay | Admitting: Family Medicine

## 2019-06-19 DIAGNOSIS — J302 Other seasonal allergic rhinitis: Secondary | ICD-10-CM

## 2019-07-10 ENCOUNTER — Encounter: Payer: Self-pay | Admitting: Family Medicine

## 2019-07-10 ENCOUNTER — Other Ambulatory Visit: Payer: Self-pay

## 2019-07-10 ENCOUNTER — Ambulatory Visit (INDEPENDENT_AMBULATORY_CARE_PROVIDER_SITE_OTHER): Payer: Medicaid Other | Admitting: Family Medicine

## 2019-07-10 VITALS — BP 94/56 | HR 77 | Temp 97.5°F | Resp 16 | Wt 141.0 lb

## 2019-07-10 DIAGNOSIS — W57XXXA Bitten or stung by nonvenomous insect and other nonvenomous arthropods, initial encounter: Secondary | ICD-10-CM

## 2019-07-10 DIAGNOSIS — S1086XA Insect bite of other specified part of neck, initial encounter: Secondary | ICD-10-CM

## 2019-07-10 DIAGNOSIS — N390 Urinary tract infection, site not specified: Secondary | ICD-10-CM

## 2019-07-10 DIAGNOSIS — J301 Allergic rhinitis due to pollen: Secondary | ICD-10-CM

## 2019-07-10 DIAGNOSIS — J302 Other seasonal allergic rhinitis: Secondary | ICD-10-CM

## 2019-07-10 DIAGNOSIS — R350 Frequency of micturition: Secondary | ICD-10-CM

## 2019-07-10 LAB — POCT URINALYSIS DIPSTICK
Bilirubin, UA: NEGATIVE
Glucose, UA: NEGATIVE
Ketones, UA: NEGATIVE
Nitrite, UA: NEGATIVE
Protein, UA: NEGATIVE
Spec Grav, UA: 1.01 (ref 1.010–1.025)
Urobilinogen, UA: 0.2 E.U./dL
pH, UA: 6 (ref 5.0–8.0)

## 2019-07-10 MED ORDER — CEFDINIR 300 MG PO CAPS: 600.0000 mg | ORAL_CAPSULE | Freq: Every day | ORAL | 0 refills | Status: AC

## 2019-07-10 NOTE — Progress Notes (Signed)
I,Erin Richards,acting as a scribe for Erin Richards.,have documented all relevant documentation on the behalf of Erin Richards,as directed by  Erin Richards while in the presence of Erin Richards.   Established patient visit   Patient: Erin Richards   DOB: 07-Oct-1965   54 y.o. Female  MRN: [id number] Visit Date: 07/10/2019  Today's healthcare provider: Lelon Huh, Richards   Chief Complaint  Patient presents with  . Urinary Frequency    x 2 days   Subjective    Urinary Frequency  This is a new problem. Episode onset: 2 days ago. The problem has been unchanged. There has been no fever. Associated symptoms include frequency and urgency. Pertinent negatives include no chills, discharge, flank pain, hesitancy, nausea or vomiting. She has tried nothing for the symptoms.  Patient feels that she is unable to completely empty her bladder.   Breast pain: Patient complains of sharp needle pricking pain of the right breast. She had a breast biopsy done a few months ago. She states that her bra keeps rubbing against the area where the biopsy was performed.    Skin lesion: Patient complains of an itchy skin lesion of the upper back. She believes she was bitten by a bug.     Medications: Outpatient Medications Prior to Visit  Medication Sig  . albuterol (PROAIR HFA) 108 (90 Base) MCG/ACT inhaler Inhale 1-2 puffs into the lungs every 6 (six) hours as needed for shortness of breath.  . cetirizine (ZYRTEC) 10 MG tablet TAKE 1 TABLET BY MOUTH EVERY DAY  . citalopram (CELEXA) 10 MG tablet Take 1 tablet (10 mg total) by mouth daily.  . COMBIVENT RESPIMAT 20-100 MCG/ACT AERS respimat INHALE 1 PUFF INTO THE LUNGS EVERY 4 (FOUR) HOURS AS NEEDED FOR WHEEZING.  Marland Kitchen conjugated estrogens (PREMARIN) vaginal cream Place 9.37 Applicatorfuls vaginally daily.  . fexofenadine (ALLEGRA) 60 MG tablet Take 1 tablet (60 mg total) by mouth 2 (two) times daily.  Marland Kitchen gabapentin (NEURONTIN) 300 MG  capsule TAKE 1 CAPSULE (300 MG TOTAL) BY MOUTH DAILY.  . hyoscyamine (LEVSIN, ANASPAZ) 0.125 MG tablet TAKE ONE TABLET BY MOUTH EVERY 6 HOURS AS NEEDED FOR CRAMPING FOR UP TO 10 DAYS  . ipratropium (ATROVENT) 0.03 % nasal spray Place 2 sprays into the nose daily as needed.  . meloxicam (MOBIC) 15 MG tablet Take by mouth.  . montelukast (SINGULAIR) 10 MG tablet TAKE 1 TABLET (10 MG TOTAL) BY MOUTH AT BEDTIME.  . nortriptyline (PAMELOR) 25 MG capsule TAKE ONE EVERY DAY AT BEDTIME AND ONE DURING THE DAY AS NEEDED FOR MIGRAINES.  Marland Kitchen pantoprazole (PROTONIX) 40 MG tablet TAKE 1 TABLET BY MOUTH TWICE A DAY  . azelastine (ASTELIN) 0.1 % nasal spray Place 1 spray into both nostrils 2 (two) times daily. (Patient not taking: Reported on 07/10/2019)  . fluticasone (FLONASE) 50 MCG/ACT nasal spray Place 2 sprays into both nostrils daily as needed for allergies or rhinitis. Reported on 04/03/2015 (Patient not taking: Reported on 07/10/2019)  . loperamide (IMODIUM A-D) 2 MG tablet Take 1 tablet (2 mg total) by mouth 4 (four) times daily as needed for diarrhea or loose stools. (Patient not taking: Reported on 07/10/2019)  . tiotropium (SPIRIVA) 18 MCG inhalation capsule Place 1 capsule (18 mcg total) into inhaler and inhale daily.  . [DISCONTINUED] Clindamycin Phosphate, 1 Dose, vaginal cream Insert cream vaginally for 1 dose (Patient not taking: Reported on 07/10/2019)   No facility-administered medications prior to visit.  Review of Systems  Constitutional: Negative for appetite change, chills, fatigue and fever.  Respiratory: Negative for chest tightness and shortness of breath.   Cardiovascular: Negative for chest pain and palpitations.  Gastrointestinal: Negative for abdominal pain, nausea and vomiting.  Genitourinary: Positive for frequency and urgency. Negative for flank pain and hesitancy.  Musculoskeletal: Positive for back pain.       Right breast pain  Neurological: Negative for dizziness and weakness.      Objective    BP (!) 94/56 (BP Location: Right Arm, Patient Position: Sitting, Cuff Size: Normal)   Pulse 77   Temp (!) 97.5 F (36.4 C) (Temporal)   Resp 16   Wt 141 lb (64 kg)   BMI 30.51 kg/m   Physical Exam  Small uninflamed insect bit at base of posterior neck. No discharge.  Breast without masses. About 15mm spot of inflammation at site of right breast biopsy.   Results for orders placed or performed in visit on 07/10/19  POCT Urinalysis Dipstick  Result Value Ref Range   Color, UA yellow    Clarity, UA clear    Glucose, UA Negative Negative   Bilirubin, UA negative    Ketones, UA negative    Spec Grav, UA 1.010 1.010 - 1.025   Blood, UA trace (non-hemolyzed)    pH, UA 6.0 5.0 - 8.0   Protein, UA Negative Negative   Urobilinogen, UA 0.2 0.2 or 1.0 E.U./dL   Nitrite, UA negative    Leukocytes, UA Trace (A) Negative   Appearance     Odor      Assessment & Plan     1. Urinary frequency  - Urine Culture  2. Urinary tract infection without hematuria, site unspecified - cefdinir (OMNICEF) 300 MG capsule; Take 2 capsules (600 mg total) by mouth daily for 5 days.  Dispense: 10 capsule; Refill: 0 - Urine Culture  3. Insect bite of other part of neck, initial encounter No sign of infection. Expect it to heal completely in 7-10 days. She has some stinging pain at site of breast biopsy, but is unremarkable on exam.      No follow-ups on file.      The entirety of the information documented in the History of Present Illness, Review of Systems and Physical Exam were personally obtained by me. Portions of this information were initially documented by the CMA and reviewed by me for thoroughness and accuracy.      Erin Richards  [facility] [phone] (phone) [phone] (fax)  [facility]

## 2019-07-10 NOTE — Telephone Encounter (Signed)
Opened in error

## 2019-07-10 NOTE — Patient Instructions (Addendum)
.   Please review the attached list of medications and notify my office if there are any errors.   . You can use OTC Xylocaine cream to help with nerve pain around the healed biopsy site

## 2019-07-12 ENCOUNTER — Encounter: Payer: Self-pay | Admitting: Family Medicine

## 2019-07-12 LAB — URINE CULTURE

## 2019-07-17 ENCOUNTER — Telehealth: Payer: Self-pay

## 2019-07-17 NOTE — Telephone Encounter (Signed)
Patient checking on the status of message below stating time is sensitive and the deadline to fax paperwork is 07/19/2019. Informed patient PCP is in clinic and nurse will follow up.

## 2019-07-17 NOTE — Telephone Encounter (Signed)
Copied from [place] [phone]. Topic: General - Other >> Jul 17, 2019  9:46 AM Antonieta Iba C wrote: Reason for CRM: pt called in for assistance with her form. Pt says that she is scheduled to have her surgery for a breast reduction. Pt says that she received a denial form from her insurance stating that provider need to update that it is medically necessary. Pt would like further assistance with this.   CB: [id number]

## 2019-07-18 ENCOUNTER — Telehealth: Payer: Self-pay | Admitting: Family Medicine

## 2019-07-18 NOTE — Telephone Encounter (Signed)
Patient advised.

## 2019-07-18 NOTE — Telephone Encounter (Signed)
Letter is written.

## 2019-07-18 NOTE — Telephone Encounter (Signed)
Pt calling in this morning regarding this message. Please advise.

## 2019-07-18 NOTE — Telephone Encounter (Signed)
Patient is requesting a hearing for breast reduction surgery to get approved.    I am sending a form back for your signature if you agree to be her representative.    If you sign, please fax the form along with the letter you wrote for her to the 2 fax numbers on the form that are underlined.  This is due in by June 16th so she needs this ASAP.

## 2019-07-19 NOTE — Telephone Encounter (Addendum)
Pt is requesting carol to return her call today concerning form for breast reduction .

## 2019-07-25 ENCOUNTER — Telehealth: Payer: Self-pay

## 2019-07-25 ENCOUNTER — Encounter: Payer: Self-pay | Admitting: Family Medicine

## 2019-07-25 NOTE — Telephone Encounter (Signed)
Patient scheduled for a OV with Adriana on 07/26/2019

## 2019-07-25 NOTE — Telephone Encounter (Signed)
Copied from [place] [id number]. Topic: General - Other >> Jul 25, 2019 10:42 AM Hinda Lenis D wrote: Reason for CRM: PT has a personal question for Dr Caryn Section / please call her back

## 2019-07-25 NOTE — Telephone Encounter (Signed)
Copied from [place] [phone]. Topic: General - Inquiry >> Jul 25, 2019 11:39 AM Greggory Keen D wrote: Reason for CRM: Pt ask if Dr. Caryn Section would give her a call back.  She says she isn't sleeping and is feeling more depressed today.  [phone]

## 2019-07-25 NOTE — Telephone Encounter (Signed)
Responded to patient's message via MyChart.

## 2019-07-26 ENCOUNTER — Other Ambulatory Visit: Payer: Self-pay

## 2019-07-26 ENCOUNTER — Encounter: Payer: Self-pay | Admitting: Physician Assistant

## 2019-07-26 ENCOUNTER — Ambulatory Visit (INDEPENDENT_AMBULATORY_CARE_PROVIDER_SITE_OTHER): Payer: Medicaid Other | Admitting: Physician Assistant

## 2019-07-26 VITALS — BP 96/68 | HR 72 | Temp 96.8°F | Wt 138.0 lb

## 2019-07-26 DIAGNOSIS — R21 Rash and other nonspecific skin eruption: Secondary | ICD-10-CM | POA: Diagnosis not present

## 2019-07-26 DIAGNOSIS — F32A Depression, unspecified: Secondary | ICD-10-CM

## 2019-07-26 DIAGNOSIS — F329 Major depressive disorder, single episode, unspecified: Secondary | ICD-10-CM

## 2019-07-26 DIAGNOSIS — R110 Nausea: Secondary | ICD-10-CM | POA: Diagnosis not present

## 2019-07-26 MED ORDER — PROMETHAZINE HCL 12.5 MG PO TABS: 12.5000 mg | ORAL_TABLET | Freq: Three times a day (TID) | ORAL | 0 refills | Status: DC | PRN

## 2019-07-26 MED ORDER — CITALOPRAM HYDROBROMIDE 20 MG PO TABS
20.0000 mg | ORAL_TABLET | Freq: Every day | ORAL | 0 refills | Status: DC
Start: 2019-07-26 — End: 2019-10-03

## 2019-07-26 MED ORDER — TRIAMCINOLONE ACETONIDE 0.1 % EX CREA
1.0000 | TOPICAL_CREAM | Freq: Two times a day (BID) | CUTANEOUS | 0 refills | Status: DC
Start: 2019-07-26 — End: 2019-10-03
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
## 2019-07-27 ENCOUNTER — Ambulatory Visit: Payer: Self-pay | Admitting: *Deleted

## 2019-07-27 ENCOUNTER — Telehealth: Payer: Self-pay

## 2019-07-27 ENCOUNTER — Telehealth: Payer: Self-pay | Admitting: Family Medicine

## 2019-07-27 NOTE — Telephone Encounter (Signed)
Pt is currently having vag discharge, fishy odor, itchiness and irritation. She says she is using condoms and boric acid supp. Clindesse did help with sx but didn't take them away completely. Feels like sx are back like they were at first. Pt also mentioned her doctor put her on premarin vag cream and she feels like it has taken her back to menopause, she stays crying. Wants to know if she should stop it? Advised to wait for your advise or call doc who prescribed. Aware ABC out of the office.

## 2019-07-27 NOTE — Telephone Encounter (Signed)
Please call and find out what she wants.

## 2019-07-27 NOTE — Telephone Encounter (Signed)
Called pt to get more info, taken straight to voice mail. [place].

## 2019-07-27 NOTE — Telephone Encounter (Signed)
Caller very anxious and c/o episodes of crying , shaking , loss of appetite with some nausea, insomnia and started less than 1 ago. Patient reports "came out of no where". Feeling panicked in grocery store x 1. Denies difficulty breathing or chest pain. Denies suicidal ideations. Patient currently taking Celexa and had recent visit with Terrilee Croak, [place] yesterday. Patient reports she will not see PA or psychiatrist as suggested by PA. Patient reports she feels medication premarin cream causing change with anxiety.  No  appt available for extended visit prior to 7/20.with Dr. Caryn Section at this time. Patient advised to receive notification from MD prior to continuing premarin cream. Future appt already scheduled for 08/22/19. Care advise and emotional support given. Patient verbalized understanding to call back if symptoms worsen.   Reason for Disposition  Symptoms interfere with sleep  Answer Assessment - Initial Assessment Questions 1. CONCERN: "What happened that made you call today?"     Feeling very anxious nervous hot flashes shaking crying for no reason depressed loss of appetite insomnia 2. ANXIETY SYMPTOM SCREENING: "Can you describe how you have been feeling?"  (e.g., tense, restless, panicky, anxious, keyed up, trouble sleeping, trouble concentrating)     anxious depressed, insomnia, shaking   3. ONSET: "How long have you been feeling this way?"     Less than a week for  Sx of anxiety 4. RECURRENT: "Have you felt this way before?"  If Yes, ask: "What happened that time?" "What helped these feelings go away in the past?"     Similar sx after hysterectomy years ago but not this bad 5. RISK OF HARM - SUICIDAL IDEATION:  "Do you ever have thoughts of hurting or killing yourself?"  (e.g., yes, no, no but preoccupation with thoughts about death)   - INTENT:  "Do you have thoughts of hurting or killing yourself right NOW?" (e.g., yes, no, N/A)   - PLAN: "Do you have a specific plan for how you would do  this?" (e.g., gun, knife, overdose, no plan, N/A)     no 6. RISK OF HARM - HOMICIDAL IDEATION:  "Do you ever have thoughts of hurting or killing someone else?"  (e.g., yes, no, no but preoccupation with thoughts about death)   - INTENT:  "Do you have thoughts of hurting or killing someone right NOW?" (e.g., yes, no, N/A)   - PLAN: "Do you have a specific plan for how you would do this?" (e.g., gun, knife, no plan, N/A)      no 7. FUNCTIONAL IMPAIRMENT: "How have things been going for you overall? Have you had more difficulty than usual doing your normal daily activities?"  (e.g., better, same, worse; self-care, school, work, interactions)     Deaths in family past year.no other symptoms 8. SUPPORT: "Who is with you now?" "Who do you live with?" "Do you have family or friends who you can talk to?"      No one with her . Talk to friends 9. THERAPIST: "Do you have a counselor or therapist? Name?"     Counselor sees someone at Dr. Maralyn Sago office 50. STRESSORS: "Has there been any new stress or recent changes in your life?"       No  11. CAFFEINE USE: "Do you drink caffeinated beverages, and how much each day?" (e.g., coffee, tea, colas)       yes 12. ALCOHOL USE OR SUBSTANCE USE (DRUG USE): "Do you drink alcohol or use any illegal drugs?"       no  13. OTHER SYMPTOMS: "Do you have any other physical symptoms right now?" (e.g., chest pain, palpitations, difficulty breathing, fever)       no 14. PREGNANCY: "Is there any chance you are pregnant?" "When was your last menstrual period?"       hysterectomy  Protocols used: ANXIETY AND PANIC ATTACK-A-AH

## 2019-07-27 NOTE — Telephone Encounter (Signed)
Tried again, no luck, LVMTRC. 

## 2019-07-27 NOTE — Telephone Encounter (Signed)
She can stop premarin cream for a few weeks to see if that helps, but I do think she should follow up with psychiatrist too.

## 2019-07-27 NOTE — Telephone Encounter (Signed)
Copied from [place] [phone]. Topic: General - Other >> Jul 26, 2019  4:50 PM Rainey Pines A wrote: Patient is requesting a callback from Carles Collet in regards to her appointment today. Patient did not want to disclose any information.

## 2019-07-27 NOTE — Telephone Encounter (Signed)
Just confirm her sx. Is she using condoms and boric acid supp? Did clindesse 3/21 help sx and they have just recurred or never got any better?Erin Richards

## 2019-07-27 NOTE — Telephone Encounter (Signed)
Pt calling; states her condition has not improved.  [phone]

## 2019-07-27 NOTE — Telephone Encounter (Signed)
Pt would like to speak with Dr. Sabino Snipes nurse about a reaction she believes she is having from conjugated estrogens (PREMARIN) vaginal cream /please advise

## 2019-07-28 ENCOUNTER — Other Ambulatory Visit: Payer: Self-pay | Admitting: Obstetrics and Gynecology

## 2019-07-28 DIAGNOSIS — B9689 Other specified bacterial agents as the cause of diseases classified elsewhere: Secondary | ICD-10-CM

## 2019-07-28 MED ORDER — CLINDAMYCIN PHOSPHATE (1 DOSE) 2 % VA CREA: TOPICAL_CREAM | VAGINAL | 0 refills | Status: DC

## 2019-07-28 NOTE — Telephone Encounter (Signed)
Spoke with the patient and she feels that the triamcinolone cream that was prescribed on 6/23 is giving her anxiety attacks. She reports that she had an episode where she broke down crying and feeling claustrophobic in the grocery store yesterday. After reviewing other notes from pt chart, it was recommended by Dr. Caryn Section that she see a psychiatrist. Patient declined.   She wants to know if she should stop the cream? Also, her rash is not doing any better. Please advise. Thanks!

## 2019-07-28 NOTE — Telephone Encounter (Signed)
Rx RF clindesse sent. Cont condoms and boric acid. Can stop premarin vaginal cream. Very little systemic absorption so shouldn't cause too many menopausal symptoms. F/u prn

## 2019-07-28 NOTE — Telephone Encounter (Signed)
The cream is not causing anxiety attacks. It has not been enough time to work but if she wants to stop it, then feel free. She should see the psychiatrist and Dr. Caryn Section thinks so as well.

## 2019-07-28 NOTE — Progress Notes (Signed)
Rx RF clindesse

## 2019-07-28 NOTE — Telephone Encounter (Signed)
Pt aware.

## 2019-07-28 NOTE — Telephone Encounter (Signed)
Called pt, no answer, LVMTRC. 

## 2019-07-28 NOTE — Telephone Encounter (Signed)
Left message to call back. PEC can advise pt as below.

## 2019-08-21 NOTE — Progress Notes (Signed)
Established patient visit   Patient: Erin Richards   DOB: Jan 08, 1966   54 y.o. Female  MRN: [id number] Visit Date: 08/22/2019  Today's healthcare provider: Lelon Huh, MD   Chief Complaint  Patient presents with  . Depression  . Rash   Subjective    HPI  Depression, Follow-up  She  was last seen for this 07/26/2019 by Carles Collet, PA-C.  Changes made at last visit include increasing Celexa from 10mg  to 20mg  daily and referring to psychiatry. Patient called back later expressing that she did not want to see a psychiatrist. Since then she stopped taking estrogen creamdue to "feeling like I'm going nuts" Patient reports she did not increase Celexa to 20 mg daily. She is not having side effects.   She reports fair tolerance of treatment. Current symptoms include: denies any symptoms She feels she is Improved since last visit.  Depression screen [facility] 2/9 08/22/2019 07/26/2019 04/28/2019  Decreased Interest 0 2 0  Down, Depressed, Hopeless 0 1 0  PHQ - 2 Score 0 3 0  Altered sleeping 1 3 1   Tired, decreased energy 1 2 0  Change in appetite 0 3 0  Feeling bad or failure about yourself  0 1 0  Trouble concentrating 0 2 0  Moving slowly or fidgety/restless 0 0 0  Suicidal thoughts 0 0 0  PHQ-9 Score 2 14 1   Difficult doing work/chores Not difficult at all Somewhat difficult Not difficult at all  Some recent data might be hidden    ----------------------------------------------------------------------------------------- Patient C/O rash all over body x 1 week. Patient reports taking several OTC medications, reports no improvement. Has several areas of irritated raised skin patches.   She also states she want to stop taking gabapentin which she was apparently taking for IBS, but has not been working. She previously was seen by Dr. Tiffany Kocher for IBS, but has not been seen by GI for several years.   Social History   Tobacco Use  . Smoking status: Never Smoker  . Smokeless  tobacco: Never Used  Vaping Use  . Vaping Use: Never used  Substance Use Topics  . Alcohol use: No  . Drug use: No       Medications: Outpatient Medications Prior to Visit  Medication Sig  . albuterol (PROAIR HFA) 108 (90 Base) MCG/ACT inhaler Inhale 1-2 puffs into the lungs every 6 (six) hours as needed for shortness of breath.  Marland Kitchen azelastine (ASTELIN) 0.1 % nasal spray Place 1 spray into both nostrils 2 (two) times daily.  . cetirizine (ZYRTEC) 10 MG tablet TAKE 1 TABLET BY MOUTH EVERY DAY  . citalopram (CELEXA) 10 MG tablet Take 1 tablet (10 mg total) by mouth daily.  . Clindamycin Phosphate, 1 Dose, vaginal cream Insert cream vaginally for 1 dose  . COMBIVENT RESPIMAT 20-100 MCG/ACT AERS respimat INHALE 1 PUFF INTO THE LUNGS EVERY 4 (FOUR) HOURS AS NEEDED FOR WHEEZING.  . fexofenadine (ALLEGRA) 60 MG tablet Take 1 tablet (60 mg total) by mouth 2 (two) times daily.  . fluticasone (FLONASE) 50 MCG/ACT nasal spray Place 2 sprays into both nostrils daily as needed for allergies or rhinitis. Reported on 04/03/2015  . gabapentin (NEURONTIN) 300 MG capsule TAKE 1 CAPSULE (300 MG TOTAL) BY MOUTH DAILY.  . hyoscyamine (LEVSIN, ANASPAZ) 0.125 MG tablet TAKE ONE TABLET BY MOUTH EVERY 6 HOURS AS NEEDED FOR CRAMPING FOR UP TO 10 DAYS  . ipratropium (ATROVENT) 0.03 % nasal spray Place 2 sprays into the  nose daily as needed.  . loperamide (IMODIUM A-D) 2 MG tablet Take 1 tablet (2 mg total) by mouth 4 (four) times daily as needed for diarrhea or loose stools.  . meloxicam (MOBIC) 15 MG tablet Take by mouth.  . montelukast (SINGULAIR) 10 MG tablet TAKE 1 TABLET (10 MG TOTAL) BY MOUTH AT BEDTIME.  . nortriptyline (PAMELOR) 25 MG capsule TAKE ONE EVERY DAY AT BEDTIME AND ONE DURING THE DAY AS NEEDED FOR MIGRAINES.  Marland Kitchen pantoprazole (PROTONIX) 40 MG tablet TAKE 1 TABLET BY MOUTH TWICE A DAY  . promethazine (PHENERGAN) 12.5 MG tablet Take 1 tablet (12.5 mg total) by mouth every 8 (eight) hours as needed for  nausea or vomiting.  . triamcinolone cream (KENALOG) 0.1 % Apply 1 application topically 2 (two) times daily.  . citalopram (CELEXA) 20 MG tablet Take 1 tablet (20 mg total) by mouth daily. (Patient not taking: Reported on 08/22/2019)  . conjugated estrogens (PREMARIN) vaginal cream Place 1.54 Applicatorfuls vaginally daily. (Patient not taking: Reported on 08/22/2019)  . tiotropium (SPIRIVA) 18 MCG inhalation capsule Place 1 capsule (18 mcg total) into inhaler and inhale daily.   No facility-administered medications prior to visit.    Review of Systems  Constitutional: Negative for activity change, chills and fatigue.  Respiratory: Negative for chest tightness and shortness of breath.   Cardiovascular: Negative for chest pain and palpitations.  Skin: Positive for rash.      Objective    BP (!) 97/57 (BP Location: Right Arm, Patient Position: Sitting, Cuff Size: Normal)   Pulse 69   Temp (!) 96.9 F (36.1 C) (Temporal)   Resp 16   Ht 4\' 9"  (1.448 m)   Wt 141 lb (64 kg)   BMI 30.51 kg/m  BP Readings from Last 3 Encounters:  08/22/19 (!) 97/57  07/26/19 96/68  07/10/19 (!) 94/56   Wt Readings from Last 3 Encounters:  08/22/19 141 lb (64 kg)  07/26/19 138 lb (62.6 kg)  07/10/19 141 lb (64 kg)      Physical Exam  General appearance: Obese female, cooperative and in no acute distress Head: Normocephalic, without obvious abnormality, atraumatic Respiratory: Respirations even and unlabored, normal respiratory rate Extremities: All extremities are intact.  Skin: See HPI.   No results found for any visits on 08/22/19.  Assessment & Plan     1. Depression, unspecified depression type Much better since stopping estrogen cream, is back to 10mg  tablets and wants to continue that dose.  - citalopram (CELEXA) 10 MG tablet; Take 10 mg by mouth daily.  2. Irritable bowel syndrome with both constipation and diarrhea Stop gabapentin which she states was supposed to be for IBS, but  was not helping. Will start taking TCA every day as below.   3. Other migraine without status migrainosus, not intractable Currently taking nortriptyline prn only, will start taking nightly which may also help with IBS - nortriptyline (PAMELOR) 25 MG capsule; Take 1 capsule (25 mg total) by mouth at bedtime. For migraine prevention and IBS  Dispense: 180 capsule; Refill: 1  4. Rash Allergic reaction versus scabies.  - predniSONE (DELTASONE) 20 MG tablet; Take 1 tablet (20 mg total) by mouth daily with breakfast.  Dispense: 10 tablet; Refill: 0 - permethrin (ELIMITE) 5 % cream; Apply 1 application topically once for 1 dose. Rinse off after eight hours. May repeat in 14 days if needed  Dispense: 60 g; Refill: 1   No follow-ups on file.      The entirety  of the information documented in the History of Present Illness, Review of Systems and Physical Exam were personally obtained by me. Portions of this information were initially documented by the CMA and reviewed by me for thoroughness and accuracy.      Lelon Huh, MD  [facility] [phone] (phone) [phone] (fax)  [place]

## 2019-08-22 ENCOUNTER — Ambulatory Visit (INDEPENDENT_AMBULATORY_CARE_PROVIDER_SITE_OTHER): Payer: Medicaid Other | Admitting: Family Medicine

## 2019-08-22 ENCOUNTER — Other Ambulatory Visit: Payer: Self-pay

## 2019-08-22 ENCOUNTER — Encounter: Payer: Self-pay | Admitting: Family Medicine

## 2019-08-22 VITALS — BP 97/57 | HR 69 | Temp 96.9°F | Resp 16 | Ht <= 58 in | Wt 141.0 lb

## 2019-08-22 DIAGNOSIS — G43809 Other migraine, not intractable, without status migrainosus: Secondary | ICD-10-CM | POA: Diagnosis not present

## 2019-08-22 DIAGNOSIS — F329 Major depressive disorder, single episode, unspecified: Secondary | ICD-10-CM | POA: Diagnosis not present

## 2019-08-22 DIAGNOSIS — K582 Mixed irritable bowel syndrome: Secondary | ICD-10-CM | POA: Diagnosis not present

## 2019-08-22 DIAGNOSIS — F32A Depression, unspecified: Secondary | ICD-10-CM

## 2019-08-22 DIAGNOSIS — R21 Rash and other nonspecific skin eruption: Secondary | ICD-10-CM

## 2019-08-22 MED ORDER — NORTRIPTYLINE HCL 25 MG PO CAPS: 25.0000 mg | ORAL_CAPSULE | Freq: Every day | ORAL | 1 refills | Status: DC

## 2019-08-22 MED ORDER — PERMETHRIN 5 % EX CREA: 1.0000 "application " | TOPICAL_CREAM | Freq: Once | CUTANEOUS | 1 refills | Status: AC

## 2019-08-22 MED ORDER — PREDNISONE 20 MG PO TABS: 20.0000 mg | ORAL_TABLET | Freq: Every day | ORAL | 0 refills | Status: DC
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
## 2019-10-03 ENCOUNTER — Ambulatory Visit (INDEPENDENT_AMBULATORY_CARE_PROVIDER_SITE_OTHER): Payer: Medicaid Other | Admitting: Obstetrics and Gynecology

## 2019-10-03 ENCOUNTER — Encounter: Payer: Self-pay | Admitting: Obstetrics and Gynecology

## 2019-10-03 ENCOUNTER — Other Ambulatory Visit: Payer: Self-pay

## 2019-10-03 VITALS — BP 110/60 | Ht <= 58 in | Wt 137.0 lb

## 2019-10-03 DIAGNOSIS — N760 Acute vaginitis: Secondary | ICD-10-CM | POA: Diagnosis not present

## 2019-10-03 DIAGNOSIS — B9689 Other specified bacterial agents as the cause of diseases classified elsewhere: Secondary | ICD-10-CM | POA: Diagnosis not present

## 2019-10-03 LAB — POCT WET PREP WITH KOH
Clue Cells Wet Prep HPF POC: NEGATIVE
KOH Prep POC: NEGATIVE
Trichomonas, UA: NEGATIVE
Yeast Wet Prep HPF POC: NEGATIVE

## 2019-10-03 MED ORDER — METRONIDAZOLE 0.75 % VA GEL: VAGINAL | 1 refills | Status: DC

## 2019-10-03 MED ORDER — CLINDAMYCIN PHOSPHATE (1 DOSE) 2 % VA CREA: TOPICAL_CREAM | VAGINAL | 0 refills | Status: DC

## 2019-10-03 NOTE — Patient Instructions (Signed)
I value your feedback and entrusting us with your care. If you get a [facility] patient survey, I would appreciate you taking the time to let us know about your experience today. Thank you!  As of January 12, 2019, your lab results will be released to your MyChart immediately, before I even have a chance to see them. Please give me time to review them and contact you if there are any abnormalities. Thank you for your patience.  

## 2019-10-10 ENCOUNTER — Other Ambulatory Visit: Payer: Self-pay | Admitting: Family Medicine

## 2019-10-10 ENCOUNTER — Telehealth: Payer: Self-pay

## 2019-10-10 DIAGNOSIS — Z1231 Encounter for screening mammogram for malignant neoplasm of breast: Secondary | ICD-10-CM

## 2019-10-10 DIAGNOSIS — N6489 Other specified disorders of breast: Secondary | ICD-10-CM

## 2019-10-10 NOTE — Telephone Encounter (Signed)
Copied from [place] [phone]. Topic: Referral - Request for Referral >> Oct 10, 2019 11:34 AM Gillis Ends D wrote: Has patient seen PCP for this complaint? Yes.   *If NO, is insurance requiring patient see PCP for this issue before PCP can refer them? Referral for which specialty:Mammogram Preferred provider/office: Reason for referral: Patient states that she needs a mammogram prior to her breast imaging. Since the last mammogram was 10-2018. Patient wants morning appt.

## 2019-10-11 ENCOUNTER — Other Ambulatory Visit: Payer: Self-pay

## 2019-10-11 ENCOUNTER — Emergency Department: Payer: Medicaid Other

## 2019-10-11 ENCOUNTER — Encounter: Payer: Self-pay | Admitting: Emergency Medicine

## 2019-10-11 ENCOUNTER — Ambulatory Visit: Payer: Self-pay | Admitting: Family Medicine

## 2019-10-11 DIAGNOSIS — R519 Headache, unspecified: Secondary | ICD-10-CM | POA: Insufficient documentation

## 2019-10-11 DIAGNOSIS — Z79899 Other long term (current) drug therapy: Secondary | ICD-10-CM | POA: Insufficient documentation

## 2019-10-11 DIAGNOSIS — R0600 Dyspnea, unspecified: Secondary | ICD-10-CM | POA: Diagnosis not present

## 2019-10-11 DIAGNOSIS — F329 Major depressive disorder, single episode, unspecified: Secondary | ICD-10-CM | POA: Diagnosis not present

## 2019-10-11 DIAGNOSIS — R079 Chest pain, unspecified: Secondary | ICD-10-CM | POA: Diagnosis not present

## 2019-10-11 DIAGNOSIS — Z8616 Personal history of COVID-19: Secondary | ICD-10-CM | POA: Diagnosis not present

## 2019-10-11 LAB — CBC
HCT: 36.7 % (ref 36.0–46.0)
Hemoglobin: 12.8 g/dL (ref 12.0–15.0)
MCH: 27.9 pg (ref 26.0–34.0)
MCHC: 34.9 g/dL (ref 30.0–36.0)
MCV: 80 fL (ref 80.0–100.0)
Platelets: 177 10*3/uL (ref 150–400)
RBC: 4.59 MIL/uL (ref 3.87–5.11)
RDW: 14.6 % (ref 11.5–15.5)
WBC: 5.9 10*3/uL (ref 4.0–10.5)
nRBC: 0 % (ref 0.0–0.2)

## 2019-10-11 LAB — BASIC METABOLIC PANEL
Anion gap: 9 (ref 5–15)
BUN: 6 mg/dL (ref 6–20)
CO2: 25 mmol/L (ref 22–32)
Calcium: 9.2 mg/dL (ref 8.9–10.3)
Chloride: 107 mmol/L (ref 98–111)
Creatinine, Ser: 0.69 mg/dL (ref 0.44–1.00)
GFR calc Af Amer: >60 mL/min (ref >60)
GFR calc non Af Amer: >60 mL/min (ref >60)
Glucose, Bld: 100 mg/dL — ABNORMAL HIGH (ref 70–99)
Potassium: 4.2 mmol/L (ref 3.5–5.1)
Sodium: 141 mmol/L (ref 135–145)

## 2019-10-11 LAB — TROPONIN I (HIGH SENSITIVITY)
Troponin I (High Sensitivity): 3 ng/L (ref <18)
Troponin I (High Sensitivity): 3 ng/L (ref <18)

## 2019-10-11 NOTE — ED Triage Notes (Signed)
Pt to ED via EMS from home c/o left chest pain that is sharp radiating to back, pain worse with movement, denies SOB or cough or fevers.  Pain started this afternoon.  Hx of open heart surgery, denies MIs.  Pt A&Ox4, chest rise even and unlabored, in NAD at this time.

## 2019-10-11 NOTE — ED Triage Notes (Signed)
First Nurse note:  Arrives via [facility] for c/o reproducible cheat pain x 6 hours.  325 mg ASA given and 18g saline lock started.

## 2019-10-11 NOTE — Telephone Encounter (Signed)
Pt reports severe chest pain, left sided under breast. States 10/10 constant, onset this AM but worsening. Does not radiate Also reports nausea, no vomiting, headache and mild dizziness. Reports "Feel pasty and hot."  Increased fatigue.  Pain worsens with deep breath. Pt sounds distressed, tearful. Advised ED, pt states son will take her. Offered to call EMS, states will get son to take her now.   Reason for Disposition  SEVERE chest pain  Answer Assessment - Initial Assessment Questions 1. LOCATION: "Where does it hurt?"       Under left breast 2. RADIATION: "Does the pain go anywhere else?" (e.g., into neck, jaw, arms, back)     No 3. ONSET: "When did the chest pain begin?" (Minutes, hours or days)      today 4. PATTERN "Does the pain come and go, or has it been constant since it started?"  "Does it get worse with exertion?"      Worse with deep breath 5. DURATION: "How long does it last" (e.g., seconds, minutes, hours)     constant 6. SEVERITY: "How bad is the pain?"  (e.g., Scale 1-10; mild, moderate, or severe)    - MILD (1-3): doesn't interfere with normal activities     - MODERATE (4-7): interferes with normal activities or awakens from sleep    - SEVERE (8-10): excruciating pain, unable to do any normal activities       10/10 7. CARDIAC RISK FACTORS: "Do you have any history of heart problems or risk factors for heart disease?" (e.g., angina, prior heart attack; diabetes, high blood pressure, high cholesterol, smoker, or strong family history of heart disease)     Yes 8. PULMONARY RISK FACTORS: "Do you have any history of lung disease?"  (e.g., blood clots in lung, asthma, emphysema, birth control pills)      9. CAUSE: "What do you think is causing the chest pain?"     Unsure 10. OTHER SYMPTOMS: "Do you have any other symptoms?" (e.g., dizziness, nausea, vomiting, sweating, fever, difficulty breathing, cough)      Headache, nausea, "Pasty feeling" No energy  Protocols used:  CHEST PAIN-A-AH

## 2019-10-12 ENCOUNTER — Emergency Department
Admission: EM | Admit: 2019-10-12 | Discharge: 2019-10-12 | Disposition: A | Discharge status: Home / Self Care | Payer: Medicaid Other | Attending: Emergency Medicine | Admitting: Emergency Medicine

## 2019-10-12 DIAGNOSIS — R079 Chest pain, unspecified: Secondary | ICD-10-CM

## 2019-10-12 LAB — FIBRIN DERIVATIVES D-DIMER (ARMC ONLY): Fibrin derivatives D-dimer (ARMC): 454.03 ng/mL (FEU) (ref 0.00–499.00)

## 2019-10-12 MED ORDER — NAPROXEN 500 MG PO TABS: 500.0000 mg | ORAL_TABLET | Freq: Once | ORAL | Status: DC

## 2019-10-12 MED ORDER — ACETAMINOPHEN 500 MG PO TABS: 1000.0000 mg | ORAL_TABLET | Freq: Once | ORAL | Status: DC

## 2019-10-12 NOTE — Telephone Encounter (Signed)
Order placed. She may need to call [place] to schedule

## 2019-10-12 NOTE — ED Notes (Signed)
D/c form printed, signed and sent to HIM for scanning into chart

## 2019-10-12 NOTE — Telephone Encounter (Signed)
FYI

## 2019-10-12 NOTE — Telephone Encounter (Signed)
Patient advised and given phone number to call for scheduling.
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
## 2019-10-12 NOTE — ED Notes (Signed)
Pt verbalizes understanding of d/c instructions and follow up. 

## 2019-10-13 ENCOUNTER — Ambulatory Visit: Payer: Self-pay | Admitting: *Deleted

## 2019-10-13 ENCOUNTER — Ambulatory Visit: Payer: Self-pay

## 2019-10-13 MED ORDER — ETODOLAC 200 MG PO CAPS: 200.0000 mg | ORAL_CAPSULE | Freq: Three times a day (TID) | ORAL | 0 refills | Status: DC | PRN

## 2019-10-13 NOTE — Telephone Encounter (Signed)
Patient called stating that she has talked to a nurse today because she was having chest pain.  She is call back because she is having chest pain.  She states she was just released from the ER with the same symptom and they just told her to call her PCP.  She states that pain is under her left breat and goes to her back. She rates the pain 7-8.  She states it is constant and is made worse if she moves. She says last night she felt nauseated and sick.  She has Hx of open heart surgery. She has not talk with her cardiologist. Per protocol patient was asked to go to ER. 911 offered but patient refused stating she is not going to go again and wait for hours. Call placed to office Call transferred to Dr Sabino Snipes CMA. Reason for Disposition . Patient sounds very sick or weak to the triager  Answer Assessment - Initial Assessment Questions 1. LOCATION: "Where does it hurt?"       Left side underbreast to back 2. RADIATION: "Does the pain go anywhere else?" (e.g., into neck, jaw, arms, back)     back 3. ONSET: "When did the chest pain begin?" (Minutes, hours or days)     2 days ago 4. PATTERN "Does the pain come and go, or has it been constant since it started?"  "Does it get worse with exertion?"      continuous 5. DURATION: "How long does it last" (e.g., seconds, minutes, hours)    Hours continues 6. SEVERITY: "How bad is the pain?"  (e.g., Scale 1-10; mild, moderate, or severe)    - MILD (1-3): doesn't interfere with normal activities     - MODERATE (4-7): interferes with normal activities or awakens from sleep    - SEVERE (8-10): excruciating pain, unable to do any normal activities       7-8 7. CARDIAC RISK FACTORS: "Do you have any history of heart problems or risk factors for heart disease?" (e.g., angina, prior heart attack; diabetes, high blood pressure, high cholesterol, smoker, or strong family history of heart disease)     Heart condition open heart surgery 54yrs old 8. PULMONARY RISK  FACTORS: "Do you have any history of lung disease?"  (e.g., blood clots in lung, asthma, emphysema, birth control pills)     no 9. CAUSE: "What do you think is causing the chest pain?"     unsure 10. OTHER SYMPTOMS: "Do you have any other symptoms?" (e.g., dizziness, nausea, vomiting, sweating, fever, difficulty breathing, cough)      No nauseated and sick last night 11. PREGNANCY: "Is there any chance you are pregnant?" "When was your last menstrual period?"        N/A  Protocols used: CHEST PAIN-A-AH

## 2019-10-13 NOTE — Telephone Encounter (Signed)
Patient advised.

## 2019-10-13 NOTE — Telephone Encounter (Signed)
Please advise. Patient would like something called in for chest pain. She refuses to go back to the ER. She says they didn't give her anything for pain yesterday.

## 2019-10-13 NOTE — Telephone Encounter (Signed)
Have sent prescription for etodolac

## 2019-10-13 NOTE — Telephone Encounter (Signed)
C/o chest pain under left breast and radiates to back. Denies SOB or difficulty breathing, lightheaded, dizziness. Pateint reports she was D/C from ED yesterday and was not given any pain medication for chest pain. Patient has appt with cardiologist on Tuesday 10/17/19. Patient reports she can not go back to ED due to 30 hour wait time and was told to contact PCP. Patient requesting "mobic" for pain. Reports being under a lot of stress due to family dynamics. No available appt until 10/23/19. Care advise given. Patient verbalized understanding of care advise and reports she can not go back to ED due to stress. Encouraged patient to call back or go to UC if symptoms worsen. Please advise about medication due to multiple allergies and sensitivities.   Reason for Disposition  [1] Chest pain lasting < 5 minutes AND [2] NO chest pain or cardiac symptoms (e.g., breathing difficulty, sweating) now (Exception: chest pains that last only a few seconds)  Answer Assessment - Initial Assessment Questions 1. LOCATION: "Where does it hurt?"      Left chest area under breast to back 2. RADIATION: "Does the pain go anywhere else?" (e.g., into neck, jaw, arms, back)     No  3. ONSET: "When did the chest pain begin?" (Minutes, hours or days)      Yesterday went to ED and was not admitted 4. PATTERN "Does the pain come and go, or has it been constant since it started?"  "Does it get worse with exertion?"      Constant hurts when moving, patient under "stress" due to family dynamics 5. DURATION: "How long does it last" (e.g., seconds, minutes, hours)     Every time I move  6. SEVERITY: "How bad is the pain?"  (e.g., Scale 1-10; mild, moderate, or severe)    - MILD (1-3): doesn't interfere with normal activities     - MODERATE (4-7): interferes with normal activities or awakens from sleep    - SEVERE (8-10): excruciating pain, unable to do any normal activities       Severe on left side  7. CARDIAC RISK FACTORS: "Do  you have any history of heart problems or risk factors for heart disease?" (e.g., angina, prior heart attack; diabetes, high blood pressure, high cholesterol, smoker, or strong family history of heart disease)     Heart issues 8. PULMONARY RISK FACTORS: "Do you have any history of lung disease?"  (e.g., blood clots in lung, asthma, emphysema, birth control pills)     na 9. CAUSE: "What do you think is causing the chest pain?"     "I dont know " 10. OTHER SYMPTOMS: "Do you have any other symptoms?" (e.g., dizziness, nausea, vomiting, sweating, fever, difficulty breathing, cough)       Tired  11. PREGNANCY: "Is there any chance you are pregnant?" "When was your last menstrual period?"       no  Protocols used: CHEST PAIN-A-AH

## 2019-10-16 ENCOUNTER — Telehealth: Payer: Self-pay

## 2019-10-16 NOTE — Telephone Encounter (Signed)
Patient states that she was contacted by [facility] who informed her that wrong mammogram had been ordered. Please advised if diagnostic mammogram needs to be ordered, also patient states that she prefers to have screening done at [place] instead of going back to [place] and wants to know if that can be requested with order. KW

## 2019-10-16 NOTE — Telephone Encounter (Signed)
Copied from [place] [phone]. Topic: General - Inquiry >> Oct 16, 2019  1:20 PM Gillis Ends D wrote: Reason for CRM: Patient states that she needs a different type of mammogram and needs a new referral. She asks that someone return her call. Call back to [phone]. Please advise the patient

## 2019-10-25 ENCOUNTER — Telehealth: Payer: Self-pay

## 2019-10-25 DIAGNOSIS — M5442 Lumbago with sciatica, left side: Secondary | ICD-10-CM

## 2019-10-25 DIAGNOSIS — Z1231 Encounter for screening mammogram for malignant neoplasm of breast: Secondary | ICD-10-CM

## 2019-10-25 NOTE — Telephone Encounter (Signed)
Copied from [place] [phone]. Topic: General - Other >> Oct 25, 2019  8:54 AM Celene Kras wrote: Reason for CRM: Pt called in regarding setting up an appt with the [facility]. Pt is also requesting to have a referral to [facility] for her lower back and neck. Please advise.    Phone # for [place] :[phone]

## 2019-10-31 NOTE — Telephone Encounter (Signed)
Pt calling again regarding these referrals. Please advise.

## 2019-10-31 NOTE — Telephone Encounter (Signed)
Patient advised that orders have been placed.

## 2019-11-01 ENCOUNTER — Other Ambulatory Visit: Payer: Self-pay | Admitting: Family Medicine

## 2019-11-01 DIAGNOSIS — N6489 Other specified disorders of breast: Secondary | ICD-10-CM

## 2019-11-06 ENCOUNTER — Other Ambulatory Visit: Payer: Self-pay

## 2019-11-13 ENCOUNTER — Ambulatory Visit
Admission: RE | Admit: 2019-11-13 | Discharge: 2019-11-13 | Disposition: A | Discharge status: Home / Self Care | Payer: Medicaid Other | Source: Ambulatory Visit | Attending: Family Medicine | Admitting: Family Medicine

## 2019-11-13 ENCOUNTER — Other Ambulatory Visit: Payer: Self-pay

## 2019-11-13 DIAGNOSIS — N6489 Other specified disorders of breast: Secondary | ICD-10-CM | POA: Diagnosis present

## 2019-11-13 DIAGNOSIS — Z1231 Encounter for screening mammogram for malignant neoplasm of breast: Secondary | ICD-10-CM | POA: Insufficient documentation

## 2019-11-14 ENCOUNTER — Encounter: Payer: Self-pay | Admitting: Family Medicine

## 2019-11-14 ENCOUNTER — Other Ambulatory Visit: Payer: Self-pay | Admitting: Family Medicine

## 2019-11-14 ENCOUNTER — Other Ambulatory Visit: Payer: Self-pay

## 2019-11-14 ENCOUNTER — Ambulatory Visit (INDEPENDENT_AMBULATORY_CARE_PROVIDER_SITE_OTHER): Payer: Medicaid Other | Admitting: Family Medicine

## 2019-11-14 VITALS — BP 95/44 | HR 78 | Temp 97.8°F | Ht <= 58 in | Wt 136.8 lb

## 2019-11-14 DIAGNOSIS — S56811A Strain of other muscles, fascia and tendons at forearm level, right arm, initial encounter: Secondary | ICD-10-CM | POA: Diagnosis not present

## 2019-11-14 DIAGNOSIS — J301 Allergic rhinitis due to pollen: Secondary | ICD-10-CM | POA: Diagnosis not present

## 2019-11-14 DIAGNOSIS — J4540 Moderate persistent asthma, uncomplicated: Secondary | ICD-10-CM | POA: Diagnosis not present

## 2019-11-14 DIAGNOSIS — R928 Other abnormal and inconclusive findings on diagnostic imaging of breast: Secondary | ICD-10-CM | POA: Diagnosis not present

## 2019-11-14 MED ORDER — FEXOFENADINE HCL 60 MG PO TABS: 60.0000 mg | ORAL_TABLET | Freq: Two times a day (BID) | ORAL | 4 refills | Status: DC

## 2019-11-14 MED ORDER — COMBIVENT RESPIMAT 20-100 MCG/ACT IN AERS: 1.0000 | INHALATION_SPRAY | RESPIRATORY_TRACT | 3 refills | Status: DC | PRN

## 2019-11-14 MED ORDER — CETIRIZINE HCL 10 MG PO TABS: 10.0000 mg | ORAL_TABLET | Freq: Every day | ORAL | 2 refills | Status: DC

## 2019-11-14 MED ORDER — TIOTROPIUM BROMIDE MONOHYDRATE 18 MCG IN CAPS: 18.0000 ug | ORAL_CAPSULE | Freq: Every day | RESPIRATORY_TRACT | 12 refills | Status: DC

## 2019-11-14 NOTE — Patient Instructions (Signed)
.   Please review the attached list of medications and notify my office if there are any errors.   . Please bring all of your medications to every appointment so we can make sure that our medication list is the same as yours.   
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
## 2019-11-16 ENCOUNTER — Encounter: Payer: Self-pay | Admitting: General Surgery

## 2019-11-16 ENCOUNTER — Ambulatory Visit (INDEPENDENT_AMBULATORY_CARE_PROVIDER_SITE_OTHER): Payer: Medicaid Other | Admitting: General Surgery

## 2019-11-16 ENCOUNTER — Other Ambulatory Visit: Payer: Self-pay

## 2019-11-16 VITALS — BP 117/67 | HR 90 | Temp 98.0°F | Ht <= 58 in | Wt 138.2 lb

## 2019-11-16 DIAGNOSIS — R928 Other abnormal and inconclusive findings on diagnostic imaging of breast: Secondary | ICD-10-CM | POA: Diagnosis not present

## 2019-11-16 NOTE — Patient Instructions (Addendum)
Our surgery scheduler Pamala Hurry will contact you within the next week to discuss surgery. Please have the BLUE sheet available when she contacts you. She will discuss the different days and times for surgery along with the preparation prior to surgery. If you have any questions or concerns, please do not hesitate to give our office a call. Dr.Cannon advised patient to try Sports Bra or Bra with good compression to help with healing process after surgery. Dr.Cannon discussed the surgical treatment and risk factors of surgery at today's visit with the patient.

## 2019-11-17 ENCOUNTER — Other Ambulatory Visit: Payer: Self-pay | Admitting: General Surgery

## 2019-11-17 DIAGNOSIS — R928 Other abnormal and inconclusive findings on diagnostic imaging of breast: Secondary | ICD-10-CM

## 2019-11-17 NOTE — Progress Notes (Signed)
Patient ID: RAECHELLE SARTI, female   DOB: 05/19/1965, 54 y.o.   MRN: [id number]  Chief Complaint  Patient presents with  . New Patient (Initial Visit)    new patient Abnormal mammogram of right breast referred by Dr. Caryn Section    HPI LAMONT GLASSCOCK is a 54 y.o. female.   Although she is referred as a new patient, I actually saw her in March of this year for an abnormal mammogram.  My HPI, assessment, and plan from that visit are copied here:  "She has been referred by her primary care provider, Dr. Caryn Section, for further evaluation of an abnormal mammogram.  In September 2020, she underwent routine screening mammogram.  This resulted in a callback and biopsy x3 of a right sided area of architectural distortion.  The biopsies were all consistent with benign process.  She underwent short-term interval mammography this past week, and was told, in her words "you just need to take the whole thing off" by the radiologist.  On review of their documentation, they do suggest surgical excision of the area of abnormal architecture.  She is here today for further discussion of these recommendations.    She reports that the mammogram done last year was her first abnormal mammogram.  As stated previously, the prior biopsies were benign.  Age of menarche was at 84 or 54 years old.  She had 2 pregnancies, first at the age of 13.  She did not breast-feed either child.  She has used both birth control and hormone replacement therapy, but I do not have data for the duration of use for either of these agents.  She says she may have a sister with a history of breast cancer, but then also says that her sister is not necessarily the most truthful individual.  She has undergone a hysterectomy.  She denies any palpable abnormality within her breast.  No skin changes.  She reports that both nipples are inverted and have always been so.  She denies any nipple discharge.  She does endorse some breast tenderness.  She does not always  perform monthly breast exams but does do them on occasion."  "Assessment This is a 54 year old woman who presents today for follow-up of a BI-RADS 4 mammographic study.  She has had prior biopsies of the area of concern, all of which were consistent with a benign process.  Apparently, the imaging obtained most recently appears more distorted and prominent than on the study that led to the biopsies.  On physical examination, the area of concern is palpable and feels more consistent with benign fibrocystic breast tissue.  It is freely mobile and quite soft."  "Plan I had an extensive discussion with the patient this afternoon regarding the recommendations from radiology.  I discussed with her that certainly, there is a possibility of sampling error from the 3 prior biopsies, such that a potential malignancy could have been missed in this fairly large area of concern.  I also expressed my concern that we do not have a discrete diagnosis of malignancy to drive [place] to perform a fairly substantial resection of breast tissue that would cause asymmetry and cosmetic concerns.  I have recommended that she undergo a an MRI of the breast, which may give [place] additional information.  I also cautioned her that it may be of no additional use and that we would potentially need to proceed with surgery, regardless of the results.  The MRI could guide additional biopsy or confirm that the findings  on mammography are more insidious than the prior biopsy results would suggest.  We will get this scheduled and I will contact her once I have the results to make further plans."  She ultimately did undergo the MRI which resulted in yet another biopsy, once again with benign findings.  She had a 34-month follow-up mammogram that once again described architectural distortion, stating that it appeared to be worse than on prior imaging studies:  IMPRESSION: 1. Progressive increase distortion in the upper inner quadrant of the right  breast surrounding the 3 biopsy marking clips from her benign but discordant biopsies.  2.  No evidence of malignancy in the left breast.    As a result, she is here today to once again discuss surgical resection.  Today, she is accompanied by her significant other.  She reports that she is ready to undergo surgical excision, as she is tired of the repeated mammograms and biopsies.  She denies any skin changes or palpable masses.  No nipple discharge.   Past Medical History:  Diagnosis Date  . Actinic keratosis   . Allergy   . ASD (atrial septal defect)   . Clostridium difficile colitis 10/07/2014  . Concussion 07/03/2013  . COVID-19    12/2018  . Depression   . Dyspnea   . Headache    migraines  . History of Clostridium difficile colitis 09/24/2015  . History of colitis 09/24/2015  . IBS (irritable bowel syndrome)   . 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 Toe fracture 06/10/2015    Past Surgical History:  Procedure Laterality Date  . ABDOMINAL HYSTERECTOMY    . BREAST BIOPSY Right 10/14/2018   Affirm bx #1 Ribbon clip-Benign breast tissue with dense stromal fibrosis and sclereosing adenosis  . BREAST BIOPSY Right 10/14/2018   Affirm bx #2 Coil clip-benign breast tissue with dense stromal fibrosis and sclerosing  . BREAST BIOPSY Right 10/14/2018   Affirm bx #3 "X" clip- benign breast tissue with dense stromal fibrosis and usual ductal hyplasia.   Marland Kitchen CARDIAC SURGERY    . COLONOSCOPY WITH PROPOFOL N/A 08/16/2014   Procedure: COLONOSCOPY WITH PROPOFOL;  Surgeon: Manya Silvas, MD;  Location: [facility] ENDOSCOPY;  Service: Endoscopy;  Laterality: N/A;  . COLONOSCOPY WITH PROPOFOL N/A 08/27/2017   Procedure: COLONOSCOPY WITH PROPOFOL;  Surgeon: Manya Silvas, MD;  Location: [facility] ENDOSCOPY;  Service: Endoscopy;  Laterality: N/A;  . ESOPHAGOGASTRODUODENOSCOPY (EGD) WITH PROPOFOL  08/16/2014   Procedure: ESOPHAGOGASTRODUODENOSCOPY (EGD) WITH PROPOFOL;  Surgeon: Manya Silvas, MD;  Location: [facility] ENDOSCOPY;  Service: Endoscopy;;  . TOTAL ABDOMINAL HYSTERECTOMY W/ BILATERAL SALPINGOOPHORECTOMY  01/08/2009   supracervical    Family History  Problem Relation Age of Onset  . Heart disease Father   . Cancer Father   . Alcohol abuse Father   . Cancer Sister        pt unsure    Social History Social History   Tobacco Use  . Smoking status: Never Smoker  . Smokeless tobacco: Never Used  Vaping Use  . Vaping Use: Never used  Substance Use Topics  . Alcohol use: No  . Drug use: No    Allergies  Allergen Reactions  . Acetaminophen-Codeine Nausea And Vomiting  . Antiseptic Oral Rinse [Cetylpyridinium Chloride] Other (See Comments)    Mouth sores  . Aspartame Other (See Comments)    Reaction: unknown  . Biaxin [Clarithromycin] Nausea And Vomiting  . Carafate [Sucralfate]     Pt says her "Stomach  hurts" when she takes it.    . Chlorhexidine Gluconate Nausea And Vomiting  . Clindamycin/Lincomycin Nausea And Vomiting  . Codeine Itching and Nausea And Vomiting  . Dextromethorphan Hbr Other (See Comments)    Reaction: unknown  . Dilaudid [Hydromorphone Hcl] Nausea And Vomiting  . Doxycycline Nausea And Vomiting  . Fentanyl Nausea And Vomiting  . Fluticasone-Salmeterol Other (See Comments)    Blister in mouth Other reaction(s): Other (See Comments) Blister in mouth  . Germanium Other (See Comments)  . Hydrocodone Nausea And Vomiting  . Ketorolac Other (See Comments)  . Levofloxacin Other (See Comments)    GI upset. GI upset  . Mefenamic Acid Nausea And Vomiting  . Metformin And Related Nausea And Vomiting  . Metronidazole Diarrhea and Nausea And Vomiting  . Morphine And Related Nausea And Vomiting  . Moxifloxacin Swelling  . Nitrofurantoin Nausea And Vomiting and Other (See Comments)  . Nsaids Other (See Comments)    Reaction: unknown  . Oxycodone-Acetaminophen Nausea And Vomiting  . Periguard [Dimethicone] Nausea And Vomiting  .  Permethrin Other (See Comments)    Pt's mind was racing all the time.  . Phenothiazines Nausea And Vomiting  . Pioglitazone Nausea And Vomiting  . Quinidine Nausea And Vomiting  . Quinolones Nausea And Vomiting  . Rumex Crispus Other (See Comments)  . Tetracyclines & Related Nausea And Vomiting  . Toradol [Ketorolac Tromethamine] Nausea And Vomiting  . Tramadol Nausea And Vomiting  . Tussin [Guaifenesin] Nausea And Vomiting  . Tussionex Pennkinetic Er [Hydrocod Polst-Cpm Polst Er] Nausea And Vomiting  . Buprenorphine Hcl Nausea And Vomiting  . Lincomycin Hcl Nausea And Vomiting  . Oxycodone-Acetaminophen Hives and Nausea And Vomiting    Other reaction(s): Nausea And Vomiting, Unknown  . Phenylalanine Nausea And Vomiting    Current Outpatient Medications  Medication Sig Dispense Refill  . albuterol (PROAIR HFA) 108 (90 Base) MCG/ACT inhaler Inhale 1-2 puffs into the lungs every 6 (six) hours as needed for shortness of breath. 8 g 6  . azelastine (ASTELIN) 0.1 % nasal spray Place 1 spray into both nostrils 2 (two) times daily. 30 mL 12  . cetirizine (ZYRTEC) 10 MG tablet Take 1 tablet (10 mg total) by mouth daily. 90 tablet 2  . citalopram (CELEXA) 10 MG tablet Take 10 mg by mouth daily.    Marland Kitchen etodolac (LODINE) 200 MG capsule Take 1 capsule (200 mg total) by mouth every 8 (eight) hours as needed for moderate pain. 20 capsule 0  . fexofenadine (ALLEGRA) 60 MG tablet Take 1 tablet (60 mg total) by mouth 2 (two) times daily. 60 tablet 4  . fluticasone (FLONASE) 50 MCG/ACT nasal spray Place 2 sprays into both nostrils daily as needed for allergies or rhinitis. Reported on 04/03/2015 16 g 5  . hyoscyamine (LEVSIN, ANASPAZ) 0.125 MG tablet TAKE ONE TABLET BY MOUTH EVERY 6 HOURS AS NEEDED FOR CRAMPING FOR UP TO 10 DAYS 30 tablet 5  . ipratropium (ATROVENT) 0.03 % nasal spray Place 2 sprays into the nose daily as needed. 30 mL 4  . Ipratropium-Albuterol (COMBIVENT RESPIMAT) 20-100 MCG/ACT AERS  respimat Inhale 1 puff into the lungs every 4 (four) hours as needed for wheezing. 4 g 3  . loperamide (IMODIUM A-D) 2 MG tablet Take 1 tablet (2 mg total) by mouth 4 (four) times daily as needed for diarrhea or loose stools. 30 tablet 0  . meloxicam (MOBIC) 15 MG tablet Take by mouth.    . metroNIDAZOLE (METROGEL) 0.75 %  vaginal gel Insert 1 applicatorful twice wkly for 3 months as preventive 50 g 1  . montelukast (SINGULAIR) 10 MG tablet TAKE 1 TABLET (10 MG TOTAL) BY MOUTH AT BEDTIME. 30 tablet 12  . nortriptyline (PAMELOR) 25 MG capsule Take 1 capsule (25 mg total) by mouth at bedtime. For migraine prevention and IBS 180 capsule 1  . promethazine (PHENERGAN) 12.5 MG tablet Take 1 tablet (12.5 mg total) by mouth every 8 (eight) hours as needed for nausea or vomiting. 20 tablet 0  . tiotropium (SPIRIVA) 18 MCG inhalation capsule Place 1 capsule (18 mcg total) into inhaler and inhale daily. 30 capsule 12  . zolpidem (AMBIEN) 5 MG tablet Take 5 mg by mouth at bedtime as needed for sleep.    . Clindamycin Phosphate, 1 Dose, vaginal cream Insert cream vaginally for 1 dose (Patient not taking: Reported on 11/16/2019) 5 g 0   No current facility-administered medications for this visit.    Review of Systems Review of Systems  All other systems reviewed and are negative.   Blood pressure 117/67, pulse 90, temperature 98 F (36.7 C), temperature source Oral, height 4\' 9"  (1.448 m), weight 138 lb 3.2 oz (62.7 kg), SpO2 94 %. Body mass index is 29.91 kg/m.  Physical Exam Physical Exam Constitutional:      General: She is not in acute distress.    Appearance: Normal appearance.  HENT:     Head: Normocephalic and atraumatic.     Nose:     Comments: Covered with a mask    Mouth/Throat:     Comments: Covered with a mask Eyes:     General: No scleral icterus.       Right eye: No discharge.        Left eye: No discharge.  Neck:     Comments: No palpable cervical or supraclavicular  lymphadenopathy.  The trachea is midline.  No thyromegaly or discrete thyroid masses are palpated. Cardiovascular:     Rate and Rhythm: Normal rate and regular rhythm.     Pulses: Normal pulses.  Pulmonary:     Effort: Pulmonary effort is normal.     Breath sounds: Normal breath sounds.  Chest:     Breasts:        Right: Inverted nipple and skin change present. No mass or nipple discharge.        Left: Inverted nipple present. No mass or nipple discharge.       Comments: Multiple scars from prior biopsies. No palpable discrete masses, but tissue is thickened in the right breast in the area of prior biopsies. Abdominal:     General: Bowel sounds are normal.     Palpations: Abdomen is soft.  Genitourinary:    Comments: Deferred Musculoskeletal:        General: No swelling or tenderness.  Lymphadenopathy:     Upper Body:     Right upper body: No supraclavicular, axillary or pectoral adenopathy.     Left upper body: No supraclavicular, axillary or pectoral adenopathy.  Skin:    General: Skin is warm and dry.  Neurological:     General: No focal deficit present.     Mental Status: She is alert and oriented to person, place, and time.  Psychiatric:        Mood and Affect: Mood normal.        Behavior: Behavior normal.     Data Reviewed I reviewed the 3 breast biopsies from October 14, 2018:   Surgical Pathology  CASE: [phone]  PATIENT: [place]  Surgical Pathology Report      SPECIMEN SUBMITTED:  A. Breast, right, upper outer quadrant  B. Breast, right, upper out quadrant, most sup location  C. Breast, right, upper retro areolar   CLINICAL HISTORY:  For 1 and 2: 2 sites of architectural distortion in the upper outer  quadrant of the right breast. For 3, indeterminate CALS in upper retro  areolar right breast   PRE-OPERATIVE DIAGNOSIS:  For 1 and 2: CSL vs CA. For 3: FCC vs adenosis vs DCIS   POST-OPERATIVE DIAGNOSIS:  Ribbon and coil clips  medially displaced from targeted sites in UOQ (1.4  and 1.8 cm). X clip is displaced 3.6 cm lateral from calcifications.      DIAGNOSIS:  A. RIGHT BREAST, UPPER OUTER QUADRANT; STEREOTACTIC NEEDLE CORE BIOPSY:  - BENIGN BREAST TISSUE WITH DENSE STROMAL FIBROSIS AND SCLEROSING  ADENOSIS.  - NEGATIVE FOR ATYPIA AND MALIGNANCY.   B. RIGHT BREAST, UPPER OUTER QUADRANT (SUPERIOR); STEREOTACTIC NEEDLE  CORE BIOPSY:  -BENIGN BREAST TISSUE WITH DENSE STROMAL FIBROSIS AND SCLEROSING  ADENOSIS.  - NEGATIVE FOR ATYPIA AND MALIGNANCY.   C. RIGHT BREAST, UPPER RETROAREOLAR; STEREOTACTIC NEEDLE CORE BIOPSY:  - BENIGN BREAST TISSUE WITH DENSE STROMAL FIBROSIS AND USUAL DUCTAL  HYPERPLASIA.  - CALCIFICATIONS ARE NOTED IN ASSOCIATION WITH BENIGN BREAST TISSUE.  - NEGATIVE FOR ATYPIA AND MALIGNANCY.   I reviewed the biopsy from May 05, 2019:  Diagnosis Breast, right, needle core biopsy, upper inner, barbell clip - PSEUDOANGIOMATOUS STROMAL HYPERPLASIA ([place]). - THERE IS NO EVIDENCE OF MALIGNANCY. - SEE COMMENT.  I reviewed mammograms performed October 06, 2018, April 14, 2019, and November 13, 2019.  The report from the most recent study is copied here: CLINICAL DATA:  54 year old female presenting for follow-up areas of distortion in the right breast. She had 2 stereotactic biopsies of distortion in the right breast and a biopsy of calcifications in the right breast, all were benign (stromal fibrosis). Surgical excision was recommended, but has not yet been performed. She had an MRI in March of 2021 and a subsequent biopsy in April of an area in the upper inner right breast which was found to be [place].  EXAM: DIGITAL DIAGNOSTIC BILATERAL MAMMOGRAM WITH TOMO AND CAD  COMPARISON:  Previous exam(s).  ACR Breast Density Category c: The breast tissue is heterogeneously dense, which may obscure small masses.  FINDINGS: The ribbon and coil shaped biopsy marking clips from the  biopsies of distortion are seen in the upper slightly inner right breast as well as the dumbbell-shaped biopsy marking clip from the MRI guided biopsy within the same area. Surrounding these clips there is progressive distortion, worsened from her exam in September of 2020. No suspicious changes are seen surrounding the X shaped biopsy marking clip in the lateral right breast. No other suspicious calcifications, masses or areas of distortion are seen in the bilateral breasts.  Mammographic images were processed with CAD.  IMPRESSION: 1. Progressive increase distortion in the upper inner quadrant of the right breast surrounding the 3 biopsy marking clips from her benign but discordant biopsies.  2.  No evidence of malignancy in the left breast.  RECOMMENDATION: 1.  Surgical consultation recommended for excision.  2. If surgery is delayed/not performed, then the patient should proceed with her six-month follow-up MRI as recommended on the 05/05/2019 report, and is due at this time.  I have discussed the findings and recommendations with the patient. If applicable, a reminder letter  will be sent to the patient regarding the next appointment.  BI-RADS CATEGORY  4: Suspicious.  Assessment This is a 54 year old woman who has had multiple biopsies of an area of architectural distortion in her right breast.  All of these have been benign, however the radiologist believe these results are discordant with her imaging.  As a result, they have continue to recommend removal of the area.  At this point, Ms. Guess is tired of the repeated imaging and biopsies and is interested in surgical excision.  Plan I have offered her a radiofrequency tag-localized partial mastectomy (lumpectomy).  She does not require sentinel node biopsy due to the benign findings on core biopsies.  I discussed the risks of surgery with her.  These include, but are not limited to, bleeding, infection, undesirable  cosmetic outcome, identification of unanticipated malignancy, with need for subsequent additional intervention/treatment.  She and her significant other had the opportunity to ask  questions, which were answered to their satisfaction.  We will work on getting her scheduled.    Fredirick Maudlin 11/17/2019, 9:00 AM

## 2019-11-17 NOTE — H&P (View-Only) (Signed)
Patient ID: Erin Richards, female   DOB: [birth year], 54 y.o.   MRN: [id number]  Chief Complaint  Patient presents with  . New Patient (Initial Visit)    new patient Abnormal mammogram of right breast referred by Dr. Caryn Section    HPI Erin Richards is a 54 y.o. female.   Although she is referred as a new patient, I actually saw her in March of this year for an abnormal mammogram.  My HPI, assessment, and plan from that visit are copied here:  "She has been referred by her primary care provider, Dr. Caryn Section, for further evaluation of an abnormal mammogram.  In September 2020, she underwent routine screening mammogram.  This resulted in a callback and biopsy x3 of a right sided area of architectural distortion.  The biopsies were all consistent with benign process.  She underwent short-term interval mammography this past week, and was told, in her words "you just need to take the whole thing off" by the radiologist.  On review of their documentation, they do suggest surgical excision of the area of abnormal architecture.  She is here today for further discussion of these recommendations.    She reports that the mammogram done last year was her first abnormal mammogram.  As stated previously, the prior biopsies were benign.  Age of menarche was at 61 or 55 years old.  She had 2 pregnancies, first at the age of 48.  She did not breast-feed either child.  She has used both birth control and hormone replacement therapy, but I do not have data for the duration of use for either of these agents.  She says she may have a sister with a history of breast cancer, but then also says that her sister is not necessarily the most truthful individual.  She has undergone a hysterectomy.  She denies any palpable abnormality within her breast.  No skin changes.  She reports that both nipples are inverted and have always been so.  She denies any nipple discharge.  She does endorse some breast tenderness.  She does not always  perform monthly breast exams but does do them on occasion."  "Assessment This is a 54 year old woman who presents today for follow-up of a BI-RADS 4 mammographic study.  She has had prior biopsies of the area of concern, all of which were consistent with a benign process.  Apparently, the imaging obtained most recently appears more distorted and prominent than on the study that led to the biopsies.  On physical examination, the area of concern is palpable and feels more consistent with benign fibrocystic breast tissue.  It is freely mobile and quite soft."  "Plan I had an extensive discussion with the patient this afternoon regarding the recommendations from radiology.  I discussed with her that certainly, there is a possibility of sampling error from the 3 prior biopsies, such that a potential malignancy could have been missed in this fairly large area of concern.  I also expressed my concern that we do not have a discrete diagnosis of malignancy to drive [place] to perform a fairly substantial resection of breast tissue that would cause asymmetry and cosmetic concerns.  I have recommended that she undergo a an MRI of the breast, which may give [place] additional information.  I also cautioned her that it may be of no additional use and that we would potentially need to proceed with surgery, regardless of the results.  The MRI could guide additional biopsy or confirm that the findings  on mammography are more insidious than the prior biopsy results would suggest.  We will get this scheduled and I will contact her once I have the results to make further plans."  She ultimately did undergo the MRI which resulted in yet another biopsy, once again with benign findings.  She had a 63-month follow-up mammogram that once again described architectural distortion, stating that it appeared to be worse than on prior imaging studies:  IMPRESSION: 1. Progressive increase distortion in the upper inner quadrant of the right  breast surrounding the 3 biopsy marking clips from her benign but discordant biopsies.  2.  No evidence of malignancy in the left breast.    As a result, she is here today to once again discuss surgical resection.  Today, she is accompanied by her significant other.  She reports that she is ready to undergo surgical excision, as she is tired of the repeated mammograms and biopsies.  She denies any skin changes or palpable masses.  No nipple discharge.   Past Medical History:  Diagnosis Date  . Actinic keratosis   . Allergy   . ASD (atrial septal defect)   . Clostridium difficile colitis 10/07/2014  . Concussion 07/03/2013  . COVID-19    12/2018  . Depression   . Dyspnea   . Headache    migraines  . History of Clostridium difficile colitis 09/24/2015  . History of colitis 09/24/2015  . IBS (irritable bowel syndrome)   . 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 Toe fracture 06/10/2015    Past Surgical History:  Procedure Laterality Date  . ABDOMINAL HYSTERECTOMY    . BREAST BIOPSY Right 10/14/2018   Affirm bx #1 Ribbon clip-Benign breast tissue with dense stromal fibrosis and sclereosing adenosis  . BREAST BIOPSY Right 10/14/2018   Affirm bx #2 Coil clip-benign breast tissue with dense stromal fibrosis and sclerosing  . BREAST BIOPSY Right 10/14/2018   Affirm bx #3 "X" clip- benign breast tissue with dense stromal fibrosis and usual ductal hyplasia.   Marland Kitchen CARDIAC SURGERY    . COLONOSCOPY WITH PROPOFOL N/A 08/16/2014   Procedure: COLONOSCOPY WITH PROPOFOL;  Surgeon: Manya Silvas, MD;  Location: [facility] ENDOSCOPY;  Service: Endoscopy;  Laterality: N/A;  . COLONOSCOPY WITH PROPOFOL N/A 08/27/2017   Procedure: COLONOSCOPY WITH PROPOFOL;  Surgeon: Manya Silvas, MD;  Location: [facility] ENDOSCOPY;  Service: Endoscopy;  Laterality: N/A;  . ESOPHAGOGASTRODUODENOSCOPY (EGD) WITH PROPOFOL  08/16/2014   Procedure: ESOPHAGOGASTRODUODENOSCOPY (EGD) WITH PROPOFOL;  Surgeon: Manya Silvas, MD;  Location: [facility] ENDOSCOPY;  Service: Endoscopy;;  . TOTAL ABDOMINAL HYSTERECTOMY W/ BILATERAL SALPINGOOPHORECTOMY  01/08/2009   supracervical    Family History  Problem Relation Age of Onset  . Heart disease Father   . Cancer Father   . Alcohol abuse Father   . Cancer Sister        pt unsure    Social History Social History   Tobacco Use  . Smoking status: Never Smoker  . Smokeless tobacco: Never Used  Vaping Use  . Vaping Use: Never used  Substance Use Topics  . Alcohol use: No  . Drug use: No    Allergies  Allergen Reactions  . Acetaminophen-Codeine Nausea And Vomiting  . Antiseptic Oral Rinse [Cetylpyridinium Chloride] Other (See Comments)    Mouth sores  . Aspartame Other (See Comments)    Reaction: unknown  . Biaxin [Clarithromycin] Nausea And Vomiting  . Carafate [Sucralfate]     Pt says her "Stomach  hurts" when she takes it.    . Chlorhexidine Gluconate Nausea And Vomiting  . Clindamycin/Lincomycin Nausea And Vomiting  . Codeine Itching and Nausea And Vomiting  . Dextromethorphan Hbr Other (See Comments)    Reaction: unknown  . Dilaudid [Hydromorphone Hcl] Nausea And Vomiting  . Doxycycline Nausea And Vomiting  . Fentanyl Nausea And Vomiting  . Fluticasone-Salmeterol Other (See Comments)    Blister in mouth Other reaction(s): Other (See Comments) Blister in mouth  . Germanium Other (See Comments)  . Hydrocodone Nausea And Vomiting  . Ketorolac Other (See Comments)  . Levofloxacin Other (See Comments)    GI upset. GI upset  . Mefenamic Acid Nausea And Vomiting  . Metformin And Related Nausea And Vomiting  . Metronidazole Diarrhea and Nausea And Vomiting  . Morphine And Related Nausea And Vomiting  . Moxifloxacin Swelling  . Nitrofurantoin Nausea And Vomiting and Other (See Comments)  . Nsaids Other (See Comments)    Reaction: unknown  . Oxycodone-Acetaminophen Nausea And Vomiting  . Periguard [Dimethicone] Nausea And Vomiting  .  Permethrin Other (See Comments)    Pt's mind was racing all the time.  . Phenothiazines Nausea And Vomiting  . Pioglitazone Nausea And Vomiting  . Quinidine Nausea And Vomiting  . Quinolones Nausea And Vomiting  . Rumex Crispus Other (See Comments)  . Tetracyclines & Related Nausea And Vomiting  . Toradol [Ketorolac Tromethamine] Nausea And Vomiting  . Tramadol Nausea And Vomiting  . Tussin [Guaifenesin] Nausea And Vomiting  . Tussionex Pennkinetic Er [Hydrocod Polst-Cpm Polst Er] Nausea And Vomiting  . Buprenorphine Hcl Nausea And Vomiting  . Lincomycin Hcl Nausea And Vomiting  . Oxycodone-Acetaminophen Hives and Nausea And Vomiting    Other reaction(s): Nausea And Vomiting, Unknown  . Phenylalanine Nausea And Vomiting    Current Outpatient Medications  Medication Sig Dispense Refill  . albuterol (PROAIR HFA) 108 (90 Base) MCG/ACT inhaler Inhale 1-2 puffs into the lungs every 6 (six) hours as needed for shortness of breath. 8 g 6  . azelastine (ASTELIN) 0.1 % nasal spray Place 1 spray into both nostrils 2 (two) times daily. 30 mL 12  . cetirizine (ZYRTEC) 10 MG tablet Take 1 tablet (10 mg total) by mouth daily. 90 tablet 2  . citalopram (CELEXA) 10 MG tablet Take 10 mg by mouth daily.    Marland Kitchen etodolac (LODINE) 200 MG capsule Take 1 capsule (200 mg total) by mouth every 8 (eight) hours as needed for moderate pain. 20 capsule 0  . fexofenadine (ALLEGRA) 60 MG tablet Take 1 tablet (60 mg total) by mouth 2 (two) times daily. 60 tablet 4  . fluticasone (FLONASE) 50 MCG/ACT nasal spray Place 2 sprays into both nostrils daily as needed for allergies or rhinitis. Reported on 04/03/2015 16 g 5  . hyoscyamine (LEVSIN, ANASPAZ) 0.125 MG tablet TAKE ONE TABLET BY MOUTH EVERY 6 HOURS AS NEEDED FOR CRAMPING FOR UP TO 10 DAYS 30 tablet 5  . ipratropium (ATROVENT) 0.03 % nasal spray Place 2 sprays into the nose daily as needed. 30 mL 4  . Ipratropium-Albuterol (COMBIVENT RESPIMAT) 20-100 MCG/ACT AERS  respimat Inhale 1 puff into the lungs every 4 (four) hours as needed for wheezing. 4 g 3  . loperamide (IMODIUM A-D) 2 MG tablet Take 1 tablet (2 mg total) by mouth 4 (four) times daily as needed for diarrhea or loose stools. 30 tablet 0  . meloxicam (MOBIC) 15 MG tablet Take by mouth.    . metroNIDAZOLE (METROGEL) 0.75 %  vaginal gel Insert 1 applicatorful twice wkly for 3 months as preventive 50 g 1  . montelukast (SINGULAIR) 10 MG tablet TAKE 1 TABLET (10 MG TOTAL) BY MOUTH AT BEDTIME. 30 tablet 12  . nortriptyline (PAMELOR) 25 MG capsule Take 1 capsule (25 mg total) by mouth at bedtime. For migraine prevention and IBS 180 capsule 1  . promethazine (PHENERGAN) 12.5 MG tablet Take 1 tablet (12.5 mg total) by mouth every 8 (eight) hours as needed for nausea or vomiting. 20 tablet 0  . tiotropium (SPIRIVA) 18 MCG inhalation capsule Place 1 capsule (18 mcg total) into inhaler and inhale daily. 30 capsule 12  . zolpidem (AMBIEN) 5 MG tablet Take 5 mg by mouth at bedtime as needed for sleep.    . Clindamycin Phosphate, 1 Dose, vaginal cream Insert cream vaginally for 1 dose (Patient not taking: Reported on 11/16/2019) 5 g 0   No current facility-administered medications for this visit.    Review of Systems Review of Systems  All other systems reviewed and are negative.   Blood pressure 117/67, pulse 90, temperature 98 F (36.7 C), temperature source Oral, height 4\' 9"  (1.448 m), weight 138 lb 3.2 oz (62.7 kg), SpO2 94 %. Body mass index is 29.91 kg/m.  Physical Exam Physical Exam Constitutional:      General: She is not in acute distress.    Appearance: Normal appearance.  HENT:     Head: Normocephalic and atraumatic.     Nose:     Comments: Covered with a mask    Mouth/Throat:     Comments: Covered with a mask Eyes:     General: No scleral icterus.       Right eye: No discharge.        Left eye: No discharge.  Neck:     Comments: No palpable cervical or supraclavicular  lymphadenopathy.  The trachea is midline.  No thyromegaly or discrete thyroid masses are palpated. Cardiovascular:     Rate and Rhythm: Normal rate and regular rhythm.     Pulses: Normal pulses.  Pulmonary:     Effort: Pulmonary effort is normal.     Breath sounds: Normal breath sounds.  Chest:     Breasts:        Right: Inverted nipple and skin change present. No mass or nipple discharge.        Left: Inverted nipple present. No mass or nipple discharge.       Comments: Multiple scars from prior biopsies. No palpable discrete masses, but tissue is thickened in the right breast in the area of prior biopsies. Abdominal:     General: Bowel sounds are normal.     Palpations: Abdomen is soft.  Genitourinary:    Comments: Deferred Musculoskeletal:        General: No swelling or tenderness.  Lymphadenopathy:     Upper Body:     Right upper body: No supraclavicular, axillary or pectoral adenopathy.     Left upper body: No supraclavicular, axillary or pectoral adenopathy.  Skin:    General: Skin is warm and dry.  Neurological:     General: No focal deficit present.     Mental Status: She is alert and oriented to person, place, and time.  Psychiatric:        Mood and Affect: Mood normal.        Behavior: Behavior normal.     Data Reviewed I reviewed the 3 breast biopsies from October 14, 2018:   Surgical Pathology  CASE: [id number]  PATIENT: [place]  Surgical Pathology Report      SPECIMEN SUBMITTED:  A. Breast, right, upper outer quadrant  B. Breast, right, upper out quadrant, most sup location  C. Breast, right, upper retro areolar   CLINICAL HISTORY:  For 1 and 2: 2 sites of architectural distortion in the upper outer  quadrant of the right breast. For 3, indeterminate CALS in upper retro  areolar right breast   PRE-OPERATIVE DIAGNOSIS:  For 1 and 2: CSL vs CA. For 3: FCC vs adenosis vs DCIS   POST-OPERATIVE DIAGNOSIS:  Ribbon and coil clips  medially displaced from targeted sites in UOQ (1.4  and 1.8 cm). X clip is displaced 3.6 cm lateral from calcifications.      DIAGNOSIS:  A. RIGHT BREAST, UPPER OUTER QUADRANT; STEREOTACTIC NEEDLE CORE BIOPSY:  - BENIGN BREAST TISSUE WITH DENSE STROMAL FIBROSIS AND SCLEROSING  ADENOSIS.  - NEGATIVE FOR ATYPIA AND MALIGNANCY.   B. RIGHT BREAST, UPPER OUTER QUADRANT (SUPERIOR); STEREOTACTIC NEEDLE  CORE BIOPSY:  -BENIGN BREAST TISSUE WITH DENSE STROMAL FIBROSIS AND SCLEROSING  ADENOSIS.  - NEGATIVE FOR ATYPIA AND MALIGNANCY.   C. RIGHT BREAST, UPPER RETROAREOLAR; STEREOTACTIC NEEDLE CORE BIOPSY:  - BENIGN BREAST TISSUE WITH DENSE STROMAL FIBROSIS AND USUAL DUCTAL  HYPERPLASIA.  - CALCIFICATIONS ARE NOTED IN ASSOCIATION WITH BENIGN BREAST TISSUE.  - NEGATIVE FOR ATYPIA AND MALIGNANCY.   I reviewed the biopsy from May 05, 2019:  Diagnosis Breast, right, needle core biopsy, upper inner, barbell clip - PSEUDOANGIOMATOUS STROMAL HYPERPLASIA ([place]). - THERE IS NO EVIDENCE OF MALIGNANCY. - SEE COMMENT.  I reviewed mammograms performed October 06, 2018, April 14, 2019, and November 13, 2019.  The report from the most recent study is copied here: CLINICAL DATA:  54 year old female presenting for follow-up areas of distortion in the right breast. She had 2 stereotactic biopsies of distortion in the right breast and a biopsy of calcifications in the right breast, all were benign (stromal fibrosis). Surgical excision was recommended, but has not yet been performed. She had an MRI in March of 2021 and a subsequent biopsy in April of an area in the upper inner right breast which was found to be [place].  EXAM: DIGITAL DIAGNOSTIC BILATERAL MAMMOGRAM WITH TOMO AND CAD  COMPARISON:  Previous exam(s).  ACR Breast Density Category c: The breast tissue is heterogeneously dense, which may obscure small masses.  FINDINGS: The ribbon and coil shaped biopsy marking clips from the  biopsies of distortion are seen in the upper slightly inner right breast as well as the dumbbell-shaped biopsy marking clip from the MRI guided biopsy within the same area. Surrounding these clips there is progressive distortion, worsened from her exam in September of 2020. No suspicious changes are seen surrounding the X shaped biopsy marking clip in the lateral right breast. No other suspicious calcifications, masses or areas of distortion are seen in the bilateral breasts.  Mammographic images were processed with CAD.  IMPRESSION: 1. Progressive increase distortion in the upper inner quadrant of the right breast surrounding the 3 biopsy marking clips from her benign but discordant biopsies.  2.  No evidence of malignancy in the left breast.  RECOMMENDATION: 1.  Surgical consultation recommended for excision.  2. If surgery is delayed/not performed, then the patient should proceed with her six-month follow-up MRI as recommended on the 05/05/2019 report, and is due at this time.  I have discussed the findings and recommendations with the patient. If applicable, a reminder letter  will be sent to the patient regarding the next appointment.  BI-RADS CATEGORY  4: Suspicious.  Assessment This is a 54 year old woman who has had multiple biopsies of an area of architectural distortion in her right breast.  All of these have been benign, however the radiologist believe these results are discordant with her imaging.  As a result, they have continue to recommend removal of the area.  At this point, Erin Richards is tired of the repeated imaging and biopsies and is interested in surgical excision.  Plan I have offered her a radiofrequency tag-localized partial mastectomy (lumpectomy).  She does not require sentinel node biopsy due to the benign findings on core biopsies.  I discussed the risks of surgery with her.  These include, but are not limited to, bleeding, infection, undesirable  cosmetic outcome, identification of unanticipated malignancy, with need for subsequent additional intervention/treatment.  She and her significant other had the opportunity to ask  questions, which were answered to their satisfaction.  We will work on getting her scheduled.    Erin Richards 11/17/2019, 9:00 AM

## 2019-11-20 ENCOUNTER — Other Ambulatory Visit: Payer: Self-pay | Admitting: Family Medicine

## 2019-11-20 DIAGNOSIS — J302 Other seasonal allergic rhinitis: Secondary | ICD-10-CM

## 2019-11-20 DIAGNOSIS — J4540 Moderate persistent asthma, uncomplicated: Secondary | ICD-10-CM

## 2019-11-20 DIAGNOSIS — R0602 Shortness of breath: Secondary | ICD-10-CM

## 2019-11-20 MED ORDER — MONTELUKAST SODIUM 10 MG PO TABS: 10.0000 mg | ORAL_TABLET | Freq: Every day | ORAL | 3 refills | Status: DC

## 2019-11-20 MED ORDER — ALBUTEROL SULFATE HFA 108 (90 BASE) MCG/ACT IN AERS: 1.0000 | INHALATION_SPRAY | Freq: Four times a day (QID) | RESPIRATORY_TRACT | 6 refills | Status: DC | PRN

## 2019-11-20 NOTE — Telephone Encounter (Signed)
Pt states Dr Caryn Section was going to send in Rx for her of zolpidem (AMBIEN) 5 MG tablet montelukast (SINGULAIR) 10 MG tablet albuterol (PROAIR HFA) 108 (90 Base) MCG/ACT inhaler ipratropium (ATROVENT) 0.03 % nasal spray But it is not at the pharmacy. Pt requesting refill sent to  [id number] - [place], [place] - [address] Phone:  [phone]  Fax:  [phone]

## 2019-11-20 NOTE — Telephone Encounter (Signed)
Requested Prescriptions  Pending Prescriptions Disp Refills  . montelukast (SINGULAIR) 10 MG tablet 90 tablet 3    Sig: Take 1 tablet (10 mg total) by mouth at bedtime.     Pulmonology:  Leukotriene Inhibitors Passed - 11/20/2019  9:02 AM      Passed - Valid encounter within last 12 months    Recent Outpatient Visits          6 days ago Abnormal mammogram of right breast   [facility] Birdie Sons, MD   3 months ago Depression, unspecified depression type   [facility] Birdie Sons, MD   3 months ago Depression, unspecified depression type   [place], [place], PA-C   4 months ago Urinary frequency   [facility] Birdie Sons, MD   5 months ago Urinary tract infection with hematuria, site unspecified   [facility] Birdie Sons, MD             . zolpidem (AMBIEN) 5 MG tablet 30 tablet     Sig: Take 1 tablet (5 mg total) by mouth at bedtime as needed for sleep.     Not Delegated - Psychiatry:  Anxiolytics/Hypnotics Failed - 11/20/2019  9:02 AM      Failed - This refill cannot be delegated      Failed - Urine Drug Screen completed in last 360 days.      Passed - Valid encounter within last 6 months    Recent Outpatient Visits          6 days ago Abnormal mammogram of right breast   [facility] Birdie Sons, MD   3 months ago Depression, unspecified depression type   [facility] Birdie Sons, MD   3 months ago Depression, unspecified depression type   [place], [place], PA-C   4 months ago Urinary frequency   [facility] Birdie Sons, MD   5 months ago Urinary tract infection with hematuria, site unspecified   [facility] Birdie Sons, MD             . Ipratropium-Albuterol (COMBIVENT RESPIMAT) 20-100 MCG/ACT AERS respimat 4 g     Sig: Inhale 1 puff into the lungs  every 4 (four) hours as needed for wheezing.     Pulmonology:  Combination Products Passed - 11/20/2019  9:02 AM      Passed - Valid encounter within last 12 months    Recent Outpatient Visits          6 days ago Abnormal mammogram of right breast   [facility] Birdie Sons, MD   3 months ago Depression, unspecified depression type   [facility] Birdie Sons, MD   3 months ago Depression, unspecified depression type   [place], [place], PA-C   4 months ago Urinary frequency   [facility] Birdie Sons, MD   5 months ago Urinary tract infection with hematuria, site unspecified   [facility] Birdie Sons, MD             . albuterol (PROAIR HFA) 108 (90 Base) MCG/ACT inhaler 8 g 6    Sig: Inhale 1-2 puffs into the lungs every 6 (six) hours as needed for shortness of breath.     Pulmonology:  Beta Agonists Failed - 11/20/2019  9:02 AM  Failed - One inhaler should last at least one month. If the patient is requesting refills earlier, contact the patient to check for uncontrolled symptoms.      Passed - Valid encounter within last 12 months    Recent Outpatient Visits          6 days ago Abnormal mammogram of right breast   [facility] Birdie Sons, MD   3 months ago Depression, unspecified depression type   [facility] Birdie Sons, MD   3 months ago Depression, unspecified depression type   [place], [place], PA-C   4 months ago Urinary frequency   [facility] Birdie Sons, MD   5 months ago Urinary tract infection with hematuria, site unspecified   [facility] Birdie Sons, MD

## 2019-11-20 NOTE — Telephone Encounter (Signed)
Prescriber not at this practice- rerouting to Dr Celine Ahr

## 2019-11-21 ENCOUNTER — Other Ambulatory Visit: Payer: Self-pay | Admitting: General Surgery

## 2019-11-21 ENCOUNTER — Telehealth: Payer: Self-pay | Admitting: General Surgery

## 2019-11-21 ENCOUNTER — Other Ambulatory Visit: Payer: Self-pay

## 2019-11-21 DIAGNOSIS — R928 Other abnormal and inconclusive findings on diagnostic imaging of breast: Secondary | ICD-10-CM

## 2019-11-21 DIAGNOSIS — F5101 Primary insomnia: Secondary | ICD-10-CM

## 2019-11-21 NOTE — Telephone Encounter (Signed)
Patient advised that Ambien has not been prescribed by Dr. Caryn Section. Patient states she thinks it was previously being prescribed by a psychiatrist, but patient didn't like the psychiatrist and didn't want to go back there. Patient states she spoke with Dr. Caryn Section about this and he agreed to fill this medication since she didn't want to see that psychiatrist. Please advise.

## 2019-11-21 NOTE — Telephone Encounter (Signed)
Copied from [place] [id number]. Topic: General - Inquiry >> Nov 21, 2019  1:02 PM Greggory Keen D wrote: Reason for CRM: Pt called saying she checked with the pharmacy and they still have not rec'd the request for her Ambien  CB#  [phone]  [facility]

## 2019-11-21 NOTE — Telephone Encounter (Signed)
Patient has been advised of Pre-Admission date/time, COVID Testing date and Surgery date.  Surgery Date: 12/08/19 Preadmission Testing Date: 11/30/19 (phone 8a-1p) Covid Testing Date: 12/06/19 - patient advised to go to the [place] [place] ([place] between 8a-1p  Patient has been made aware to call [phone], between 1-3:00pm the day before surgery, to find out what time to arrive for surgery.   Also patient is made aware of her RF tag to be placed at [facility] on 12/05/19.

## 2019-11-22 MED ORDER — ZOLPIDEM TARTRATE 5 MG PO TABS: 5.0000 mg | ORAL_TABLET | Freq: Every evening | ORAL | 2 refills | Status: DC | PRN

## 2019-11-24 ENCOUNTER — Ambulatory Visit: Payer: Medicaid Other | Admitting: Occupational Therapy

## 2019-11-27 ENCOUNTER — Ambulatory Visit: Payer: Medicaid Other | Admitting: Occupational Therapy

## 2019-11-28 ENCOUNTER — Emergency Department: Payer: No Typology Code available for payment source

## 2019-11-28 ENCOUNTER — Other Ambulatory Visit: Payer: Self-pay

## 2019-11-28 ENCOUNTER — Emergency Department
Admission: EM | Admit: 2019-11-28 | Discharge: 2019-11-28 | Disposition: A | Discharge status: Home / Self Care | Payer: No Typology Code available for payment source | Attending: Emergency Medicine | Admitting: Emergency Medicine

## 2019-11-28 DIAGNOSIS — M542 Cervicalgia: Secondary | ICD-10-CM | POA: Diagnosis not present

## 2019-11-28 DIAGNOSIS — Y999 Unspecified external cause status: Secondary | ICD-10-CM | POA: Diagnosis not present

## 2019-11-28 DIAGNOSIS — S3991XA Unspecified injury of abdomen, initial encounter: Secondary | ICD-10-CM | POA: Diagnosis present

## 2019-11-28 DIAGNOSIS — S301XXA Contusion of abdominal wall, initial encounter: Secondary | ICD-10-CM | POA: Diagnosis not present

## 2019-11-28 DIAGNOSIS — R519 Headache, unspecified: Secondary | ICD-10-CM | POA: Diagnosis not present

## 2019-11-28 DIAGNOSIS — Y92410 Unspecified street and highway as the place of occurrence of the external cause: Secondary | ICD-10-CM | POA: Diagnosis not present

## 2019-11-28 DIAGNOSIS — Z79899 Other long term (current) drug therapy: Secondary | ICD-10-CM | POA: Diagnosis not present

## 2019-11-28 DIAGNOSIS — T1490XA Injury, unspecified, initial encounter: Secondary | ICD-10-CM

## 2019-11-28 DIAGNOSIS — T07XXXA Unspecified multiple injuries, initial encounter: Secondary | ICD-10-CM

## 2019-11-28 DIAGNOSIS — Y939 Activity, unspecified: Secondary | ICD-10-CM | POA: Insufficient documentation

## 2019-11-28 LAB — CBC WITH DIFFERENTIAL/PLATELET
Abs Immature Granulocytes: 0.02 10*3/uL (ref 0.00–0.07)
Basophils Absolute: 0 10*3/uL (ref 0.0–0.1)
Basophils Relative: 1 %
Eosinophils Absolute: 0 10*3/uL (ref 0.0–0.5)
Eosinophils Relative: 1 %
HCT: 37.6 % (ref 36.0–46.0)
Hemoglobin: 12.5 g/dL (ref 12.0–15.0)
Immature Granulocytes: 1 %
Lymphocytes Relative: 31 %
Lymphs Abs: 1.3 10*3/uL (ref 0.7–4.0)
MCH: 27.6 pg (ref 26.0–34.0)
MCHC: 33.2 g/dL (ref 30.0–36.0)
MCV: 83 fL (ref 80.0–100.0)
Monocytes Absolute: 0.3 10*3/uL (ref 0.1–1.0)
Monocytes Relative: 9 %
Neutro Abs: 2.3 10*3/uL (ref 1.7–7.7)
Neutrophils Relative %: 57 %
Platelets: 178 10*3/uL (ref 150–400)
RBC: 4.53 MIL/uL (ref 3.87–5.11)
RDW: 13.6 % (ref 11.5–15.5)
WBC: 4 10*3/uL (ref 4.0–10.5)
nRBC: 0 % (ref 0.0–0.2)

## 2019-11-28 LAB — URINALYSIS, COMPLETE (UACMP) WITH MICROSCOPIC
Bacteria, UA: NONE SEEN
Bilirubin Urine: NEGATIVE
Glucose, UA: NEGATIVE mg/dL
Hgb urine dipstick: NEGATIVE
Ketones, ur: NEGATIVE mg/dL
Leukocytes,Ua: NEGATIVE
Nitrite: NEGATIVE
Protein, ur: NEGATIVE mg/dL
Specific Gravity, Urine: 1.002 — ABNORMAL LOW (ref 1.005–1.030)
pH: 8 (ref 5.0–8.0)

## 2019-11-28 LAB — COMPREHENSIVE METABOLIC PANEL
ALT: 11 U/L (ref 0–44)
AST: 19 U/L (ref 15–41)
Albumin: 4.2 g/dL (ref 3.5–5.0)
Alkaline Phosphatase: 64 U/L (ref 38–126)
Anion gap: 8 (ref 5–15)
BUN: 13 mg/dL (ref 6–20)
CO2: 28 mmol/L (ref 22–32)
Calcium: 9.6 mg/dL (ref 8.9–10.3)
Chloride: 104 mmol/L (ref 98–111)
Creatinine, Ser: 0.78 mg/dL (ref 0.44–1.00)
GFR, Estimated: >60 mL/min (ref >60)
Glucose, Bld: 99 mg/dL (ref 70–99)
Potassium: 4.5 mmol/L (ref 3.5–5.1)
Sodium: 140 mmol/L (ref 135–145)
Total Bilirubin: 1.1 mg/dL (ref 0.3–1.2)
Total Protein: 7.3 g/dL (ref 6.5–8.1)

## 2019-11-28 LAB — TROPONIN I (HIGH SENSITIVITY): Troponin I (High Sensitivity): 3 ng/L (ref <18)

## 2019-11-28 MED ORDER — BACLOFEN 10 MG PO TABS: 10.0000 mg | ORAL_TABLET | Freq: Three times a day (TID) | ORAL | 0 refills | Status: DC

## 2019-11-28 MED ORDER — IOHEXOL 300 MG/ML  SOLN
100.0000 mL | Freq: Once | INTRAMUSCULAR | Status: AC | PRN
  Administered 2019-11-28: 100 mL via INTRAVENOUS
  Filled 2019-11-28: qty 100

## 2019-11-28 MED ORDER — ONDANSETRON HCL 4 MG/2ML IJ SOLN
4.0000 mg | Freq: Once | INTRAMUSCULAR | Status: AC
  Administered 2019-11-28: 4 mg via INTRAVENOUS
  Filled 2019-11-28: qty 2

## 2019-11-28 MED ORDER — FENTANYL CITRATE (PF) 100 MCG/2ML IJ SOLN
50.0000 ug | Freq: Once | INTRAMUSCULAR | Status: AC
  Administered 2019-11-28: 50 ug via INTRAVENOUS
  Filled 2019-11-28: qty 2

## 2019-11-28 MED ORDER — ETODOLAC 200 MG PO CAPS: 200.0000 mg | ORAL_CAPSULE | Freq: Three times a day (TID) | ORAL | 0 refills | Status: DC
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
## 2019-11-28 NOTE — Discharge Instructions (Addendum)
Follow-up with your regular doctor if not improving.  Follow-up orthopedics if continued neck or back pain.  Please call for an appointment.  Apply ice to all areas that hurt.  Take your medications as prescribed.  Return emergency department if worse

## 2019-11-29 ENCOUNTER — Ambulatory Visit: Payer: Self-pay | Admitting: Family Medicine
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
## 2019-11-30 ENCOUNTER — Encounter
Admission: RE | Admit: 2019-11-30 | Discharge: 2019-11-30 | Disposition: A | Discharge status: Home / Self Care | Payer: Medicaid Other | Source: Ambulatory Visit | Attending: General Surgery | Admitting: General Surgery

## 2019-11-30 ENCOUNTER — Other Ambulatory Visit: Payer: Self-pay

## 2019-11-30 ENCOUNTER — Telehealth: Payer: Self-pay | Admitting: General Surgery

## 2019-11-30 HISTORY — DX: Cardiac murmur, unspecified: R01.1

## 2019-11-30 HISTORY — DX: Gastro-esophageal reflux disease without esophagitis: K21.9

## 2019-11-30 HISTORY — DX: Anemia, unspecified: D64.9

## 2019-11-30 HISTORY — DX: Unspecified osteoarthritis, unspecified site: M19.90

## 2019-11-30 HISTORY — DX: Cardiac arrhythmia, unspecified: I49.9

## 2019-11-30 NOTE — Telephone Encounter (Signed)
Agree with ED if now vomiting and having headache after potential head injury. Some bleeds can be delayed in presentation on imaging

## 2019-11-30 NOTE — Telephone Encounter (Signed)
Per Dr.Cannon-move forward with surgery. Patient notified.

## 2019-11-30 NOTE — Patient Instructions (Addendum)
Your procedure is scheduled on: 12-08-19 FRIDAY Report to Day Surgery on the 2nd floor of the [facility]. To find out your arrival time, please call [phone] between 1PM - 3PM on:12-07-19 THURSDAY  REMEMBER: Instructions that are not followed completely may result in serious medical risk, up to and including death; or upon the discretion of your surgeon and anesthesiologist your surgery may need to be rescheduled.  Do not eat food after midnight the night before surgery.  No gum chewing, lozengers or hard candies.  You may however, drink CLEAR liquids up to 2 hours before you are scheduled to arrive for your surgery. Do not drink anything within 2 hours of your scheduled arrival time.  Clear liquids include: - water  - apple juice without pulp - gatorade (not RED, PURPLE, OR BLUE) - black coffee or tea (Do NOT add milk or creamers to the coffee or tea) Do NOT drink anything that is not on this list.  TAKE THESE MEDICATIONS THE MORNING OF SURGERY WITH A SIP OF WATER: -ZYRTEC (CETIRIZINE) -CELEXA (CITALOPRAM) -PROTONIX (PANTOPRAZOLE)-take one the night before and one on the morning of surgery - helps to prevent nausea after surgery.)  Use inhaler on the day of surgery and bring to the [facility]  One week prior to surgery: Stop Anti-inflammatories (NSAIDS) such as Advil, Aleve, Ibuprofen, Motrin, Naproxen, Naprosyn and Aspirin based products such as Excedrin, Goodys Powder, BC Powder-OK TO TAKE TYLENOL IF NEEDED  Stop ANY OVER THE COUNTER supplements until after surgery-STOP YOUR APPLE CIDER VINEGAR AND AZO COMPLETE FEMININE BALANCE NOW-YOU MAY RESUME AFTER YOUR SURGERY (You may continue taking multivitamin.)  No Alcohol for 24 hours before or after surgery.  No Smoking including e-cigarettes for 24 hours prior to surgery.  No chewable tobacco products for at least 6 hours prior to surgery.  No nicotine patches on the  day of surgery.  Do not use any "recreational" drugs for at least a week prior to your surgery.  Please be advised that the combination of cocaine and anesthesia may have negative outcomes, up to and including death. If you test positive for cocaine, your surgery will be cancelled.  On the morning of surgery brush your teeth with toothpaste and water, you may rinse your mouth with mouthwash if you wish. Do not swallow any toothpaste or mouthwash.  Do not wear jewelry, make-up, hairpins, clips or nail polish.  Do not wear lotions, powders, or perfumes.   Do not shave 48 hours prior to surgery.   Contact lenses, hearing aids and dentures may not be worn into surgery.  Do not bring valuables to the hospital. [facility] is not responsible for any missing/lost belongings or valuables.   Notify your doctor if there is any change in your medical condition (cold, fever, infection).  Wear comfortable clothing (specific to your surgery type) to the hospital.  Plan for stool softeners for home use; pain medications have a tendency to cause constipation. You can also help prevent constipation by eating foods high in fiber such as fruits and vegetables and drinking plenty of fluids as your diet allows.  After surgery, you can help prevent lung complications by doing breathing exercises.  Take deep breaths and cough every 1-2 hours. Your doctor may order a device called an Incentive Spirometer to help you take deep breaths. When coughing or sneezing, hold a pillow firmly against your incision with both hands. This is called "splinting." Doing this  helps protect your incision. It also decreases belly discomfort.  If you are being admitted to the hospital overnight, leave your suitcase in the car. After surgery it may be brought to your room.  If you are being discharged the day of surgery, you will not be allowed to drive home. You will need a responsible adult (18 years or older) to drive you  home and stay with you that night.   If you are taking public transportation, you will need to have a responsible adult (18 years or older) with you. Please confirm with your physician that it is acceptable to use public transportation.   Please call the [place] Dept. at [phone] if you have any questions about these instructions.  Visitation Policy:  Patients undergoing a surgery or procedure may have one family member or support person with them as long as that person is not COVID-19 positive or experiencing its symptoms.  That person may remain in the waiting area during the procedure.  Inpatient Visitation Update:   In an effort to ensure the safety of our team members and our patients, we are implementing a change to our visitation policy:  Effective Monday, Aug. 9, at 7 a.m., inpatients will be allowed one support person.  o The support person may change daily.  o The support person must pass our screening, gel in and out, and wear a mask at all times, including in the patient's room.  o Patients must also wear a mask when staff or their support person are in the room.  o Masking is required regardless of vaccination status.  Systemwide, no visitors 17 or younger.   I DID NOT ADD THE CHG SOAP TO YOUR BAG DUE TO YOUR CHG ALLERGY-YOU CAN SHOWER WITH DIAL SOAP THE NIGHT BEFORE AND THE MORNING OF YOUR SURGERY

## 2019-11-30 NOTE — Telephone Encounter (Signed)
OK to go ahead with surgery.
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
## 2019-11-30 NOTE — Pre-Procedure Instructions (Signed)
Progress Notes - documented in this encounter  Erin Cowman, MD - 11/21/2019 3:45 PM EDT Formatting of this note is different from the original. Established Patient Visit   Chief Complaint: Chief Complaint  Patient presents with  . Follow-up  SE  Date of Service: 11/21/2019 Date of Birth: [birth year] PCP: Birdie Sons, MD  History of Present Illness: Erin Richards is a 54 y.o.female patient who   1. 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 Residual bidirectional ventricular and atrial shunts 4. Recurrent sinusitis 5. History of C difficile colitis  The patient was seen at [facility] ED 10/12/2019 with chief complaint of chest pain. ECG revealed sinus rhythm at 63 bpm with right bundle branch block. High-sensitivity troponin was negative x2. Chest x-ray was unremarkable. She described left-sided chest discomfort, which comes and goes, with and without exertion. 2D echocardiogram 09/01/2016 revealed normal left ventricular function, with LVEF of 50%, with moderate aortic insufficiency and tricuspid regurgitation, moderate right ventricular enlargement, right ventricular systolic pressure 01.0 mmHg, similar to prior 2D echocardiogram from 10/10/2014.  The patient returns today, reports doing well overall. She denies recurrent chest pain. She has chronic exertional dyspnea. She denies palpitations or heart racing. She denies peripheral edema. The patient remains active, walks most days. She underwent stress echocardiogram earlier today, exercised 4 minutes and 13 seconds on a Bruce protocol, without ischemic ST-T wave changes. At baseline, 2D echocardiogram revealed normal left ventricular function, with LVEF of 50%, with moderate mitral tricuspid regurgitation. At peak exercise there was appropriate augmentation of all myocardial segments, without evidence for scar or ischemia, with estimated LV ejection fraction greater than 55%.  Past Medical and Surgical History  Past  Medical History Past Medical History:  Diagnosis Date  . Abdominal pain, acute, left lower quadrant 07/25/2013  . BRBPR (bright red blood per rectum) 09/24/2015  . Concussion 07/03/2013  . Constipation 07/25/2013  . Dizziness 07/03/2013  . Extremity pain 07/03/2013  . Headache 07/03/2013  . Heart murmur  . History of Clostridium difficile colitis 09/24/2015  . Internal hemorrhoids 2011  . Irritable bowel syndrome  . Irritable bowel syndrome with both constipation and diarrhea 09/24/2015  . Neck pain 07/03/2013  . Ulcerative colitis (CMS-HCC)   Past Surgical History She has a past surgical history that includes ASD repair; Colonoscopy (05/22/2005); Colonoscopy (12/30/2009); Colonoscopy (08/16/2014); egd (08/16/2014); and Colonoscopy (08/27/2017).   Medications and Allergies  Current Medications  Current Outpatient Medications  Medication Sig Dispense Refill  . albuterol (PROAIR HFA) 90 mcg/actuation inhaler Inhale into the lungs.  Marland Kitchen aspirin 81 MG EC tablet Take 81 mg by mouth once daily  . azelastine (ASTELIN) 137 mcg nasal spray 1 spray by Nasal route.  . cetirizine (ZYRTEC) 10 mg capsule Take 10 mg by mouth once daily.  . citalopram (CELEXA) 20 MG tablet TAKE 0.5 TABLETS (10 MG TOTAL) BY MOUTH DAILY. 5  . escitalopram oxalate (LEXAPRO) 10 MG tablet PLEASE SEE ATTACHED FOR DETAILED DIRECTIONS  . estradiol (ESTRACE) 0.5 MG tablet Take 0.5 mg by mouth once daily.  Marland Kitchen etodolac (LODINE) 400 MG tablet Take 400 mg by mouth 2 (two) times daily  . fexofenadine (ALLEGRA) 60 MG tablet Take 60 mg by mouth 2 (two) times daily.  . flunisolide (NASAREL) 29 mcg (0.025 %) nasal spray Place 2 sprays into both nostrils once daily as needed.  . fluticasone (FLONASE) 50 mcg/actuation nasal spray Place 2 sprays into both nostrils once daily as needed.  . gabapentin (NEURONTIN)  300 MG capsule TAKE 1 CAPSULE BY MOUTH 3 TIMES A DAY 90 capsule 5  . hyoscyamine (LEVSIN/SL) 0.125 mg SL tablet Place 1 tablet (0.125 mg  total) under the tongue every 6 (six) hours as needed for Cramping 90 tablet 1  . ipratropium (ATROVENT) 0.03 % nasal spray Place 2 sprays into both nostrils once daily as needed.  Marland Kitchen ipratropium-albuterol (COMBIVENT) 18-103 mcg/actuation inhaler Inhale 2 inhalations into the lungs 4 (four) times daily as needed for Wheezing.  . meloxicam (MOBIC) 15 MG tablet Take by mouth  . montelukast (SINGULAIR) 10 mg tablet Take 10 mg by mouth once daily as needed.  . nortriptyline (PAMELOR) 50 MG capsule Take 1 capsule (50 mg total) by mouth nightly. 30 capsule 3  . ondansetron (ZOFRAN) 4 MG tablet TAKE 1 TABLET BY MOUTH EVERY 8 HOURS AS NEEDED FOR NAUSEA AND VOMITING  . ondansetron (ZOFRAN-ODT) 4 MG disintegrating tablet Take 1 tablet (4 mg total) by mouth every 8 (eight) hours as needed for Nausea 20 tablet 0  . pantoprazole (PROTONIX) 40 MG DR tablet Take 1 tablet (40 mg total) by mouth once daily 90 tablet 3  . promethazine (PHENERGAN) 25 MG tablet Take by mouth  . ranitidine (ZANTAC) 150 MG tablet Take 150 mg by mouth 2 (two) times daily  . tiotropium (SPIRIVA) 18 mcg inhalation capsule Place 18 mcg into inhaler and inhale once daily   No current facility-administered medications for this visit.   Allergies: Aspartame, Avelox [moxifloxacin], Canasa [mesalamine], Cetylpyridinium chloride, Chlorhexidine, Clindamycin, Clindamycin hcl, Codeine, Colox [psyllium husk-laxative no.1], Dextromethorphan hbr, Dimethicone, Doxycycline, Fentanyl, Flagyl [metronidazole hcl], Fluticasone propion-salmeterol, Germanium, Guaifenesin, Hydrocodone, Hydromorphone (bulk), Ketorolac, Levaquin [levofloxacin], Macrobid [nitrofurantoin monohyd/m-cryst], Metformin, Metronidazole, Morphine, Morphine sulfate, Moxifloxacin hcl, Nsaids (non-steroidal anti-inflammatory drug), Other, Oxycodone, Percocet [oxycodone-acetaminophen], Periogard [chlorhexidine gluconate], Phenothiazines, Pioglitazone, Ponstel [mefenamic acid], Quinolones,  Sucralfate, Tetracycline, Tetracyclines, Tylenol w codeine [acetaminophen-codeine], Ultram [tramadol], Vibramycin [doxycycline calcium], Vit a-d3-e-aloe vera-zinc, Yellow dock (rumex crispus), Biaxin [clarithromycin], Buprenorphine hcl, Doxycycline monohydrate, Hydromorphone, Lincomycin hcl, Nitrofurantoin, Phenylalanine, Quinidine polygalacturonate, and Tussionex [hydrocodone-chlorpheniramine]  Social and Family History  Social History reports that she has never smoked. She has never used smokeless tobacco. She reports that she does not drink alcohol and does not use drugs.  Family History Family History  Problem Relation Age of Onset  . No Known Problems Mother  . Pancreatic cancer Father  . Breast cancer Sister  . No Known Problems Sister  . No Known Problems Son  . No Known Problems Son   Review of Systems   Review of Systems: The patient reports chest pain, with chronic exertional shortness of breath, without orthopnea, paroxysmal nocturnal dyspnea, pedal edema, palpitations, heart racing, presyncope, syncope. Review of 10 Systems is negative except as described above.  Physical Examination   Vitals: BP 122/60  Pulse 65  Ht 147.3 cm (4\' 10" )  Wt 61.7 kg (136 lb)  BMI 28.42 kg/m  Ht:147.3 cm (4\' 10" ) Wt:61.7 kg (136 lb) IOE:VOJJ surface area is 1.59 meters squared. Body mass index is 28.42 kg/m.  General: Alert and oriented. Well-appearing. No acute distress. HEENT: Pupils equally reactive to light and accomodation  Neck: Supple, no JVD Lungs: Normal effort of breathing; clear to auscultation bilaterally; no wheezes, rales, rhonchi Heart: Regular rate and rhythm. Grade 2/6 mid-peaking systolic murmur Abdomen: nondistended, with normal bowel sounds Extremities: no cyanosis, clubbing, or edema Peripheral Pulses: 2+ radial Skin: Warm, dry, no diaphoresis  Assessment   54 y.o. female with  1. ASD (atrial septal defect)  2. Multiple intracardiac shunts  3. Nonrheumatic  pulmonary valve stenosis  4. SOB (shortness of breath) on exertion  5. Chest pain with low risk for cardiac etiology   54 year old female with known congenital heart disease, status post ASD and cleft mitral valve repair, overall appears clinically stable with chronic exertional dyspnea, which is unchanged, without peripheral edema or syncope. Patient was seen at [facility] ED 10/12/2019 with new onset left-sided chest pain, with typical and atypical features, nondiagnostic ECG, high-sensitivity troponin negative x2. Stress echocardiogram earlier today revealed normal left ventricular function, without evidence for scar or ischemia.  Plan   1. Continue current medications 2. Counseled patient about low-sodium diet 3. DASH diet printed instructions given to the patient 4. Defer cardiac catheterization 5. Return to clinic for follow-up in 4 months  No orders of the defined types were placed in this encounter.  Return in about 4 months (around 03/23/2020).  Erin Cowman, MD PhD [facility]    Electronically signed by Erin Cowman, MD at 11/21/2019 4:00 PM EDT  Plan of Treatment - documented as of this encounter Upcoming Encounters Upcoming Encounters  Date Type Specialty Care Team Description  03/21/2020 Office Visit Cardiology Paraschos, Sheppard Coil, MD  [place]  [facility]  [place], [place] [address]  [phone] (Work)  [phone] (Fax)    Visit Diagnoses - documented in this encounter Diagnosis  ASD (atrial septal defect) - Primary  Ostium secundum type atrial septal defect   Multiple intracardiac shunts   Nonrheumatic pulmonary valve stenosis   SOB (shortness of breath) on exertion  Shortness of breath   Chest pain with low risk for cardiac etiology   Care Teams - documented as of this encounter Team Member Relationship Specialty Start Date End Date  Birdie Sons, MD  [address]  [place], [place]  [address]  [phone] (Work)  [phone] (Fax)  PCP - General Family Medicine 10/29/15   Images Patient Demographics  Patient Address Communication Language Race / Ethnicity Marital Status  [address] [place]) [place], [place] [address]  Former (Apr. 19, 2017 - Aug. 23, 2020): [address] [place]) [place], [place] [address] [phone] [facility]) [phone] (Home) [email or web address] English (Preferred) White / Unknown Divorced  Patient Contacts  Contact Name Contact Address Communication Relationship to Patient  Falicity Sheets Unknown [phone] [facility]) Son or Daughter, Emergency Contact  Document Information  Primary Care Provider Other Service Providers Document Coverage Dates  Birdie Sons, MD (Sep. 26, 2017September 26, 2017 - Present) [phone] (Work) [phone] (Fax) [address] [place], [place] [address] Family Medicine    Oct. 19, 2021October 19, 2021   [place] [address] [place], [place] [address]   Encounter Providers Encounter Date  Erin Cowman, MD (Attending) [phone] (Work) [phone] (Fax) [place] [facility] [place],  [address] Cardiovascular Disease Oct. 19, 2021October 19, 2021   Legal Authenticator   Daneil Dan    Show All Sections

## 2019-11-30 NOTE — Telephone Encounter (Signed)
Patient advised as below. Patient states she feels much better today and all of her symptoms have resolved. Patient states she doesn't think she needs to go to the ED since she is better.

## 2019-11-30 NOTE — Telephone Encounter (Signed)
Noted  
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
## 2019-12-05 ENCOUNTER — Other Ambulatory Visit: Payer: Self-pay

## 2019-12-05 ENCOUNTER — Ambulatory Visit
Admission: RE | Admit: 2019-12-05 | Discharge: 2019-12-05 | Disposition: A | Discharge status: Home / Self Care | Payer: Medicaid Other | Source: Ambulatory Visit | Attending: General Surgery | Admitting: General Surgery

## 2019-12-05 DIAGNOSIS — R928 Other abnormal and inconclusive findings on diagnostic imaging of breast: Secondary | ICD-10-CM | POA: Diagnosis not present

## 2019-12-06 ENCOUNTER — Other Ambulatory Visit
Admission: RE | Admit: 2019-12-06 | Discharge: 2019-12-06 | Disposition: A | Discharge status: Home / Self Care | Payer: Medicaid Other | Source: Ambulatory Visit | Attending: General Surgery | Admitting: General Surgery

## 2019-12-06 DIAGNOSIS — Z20822 Contact with and (suspected) exposure to covid-19: Secondary | ICD-10-CM | POA: Insufficient documentation

## 2019-12-06 DIAGNOSIS — Z01812 Encounter for preprocedural laboratory examination: Secondary | ICD-10-CM | POA: Diagnosis not present

## 2019-12-06 LAB — SARS CORONAVIRUS 2 (TAT 6-24 HRS): SARS Coronavirus 2: NEGATIVE

## 2019-12-07 MED ORDER — ACETAMINOPHEN 500 MG PO TABS: 1000.0000 mg | ORAL_TABLET | ORAL | Status: AC

## 2019-12-07 MED ORDER — BUPIVACAINE LIPOSOME 1.3 % IJ SUSP: 20.0000 mL | Freq: Once | INTRAMUSCULAR | Status: DC

## 2019-12-07 MED ORDER — CEFAZOLIN SODIUM-DEXTROSE 2-4 GM/100ML-% IV SOLN
2.0000 g | INTRAVENOUS | Status: AC
  Administered 2019-12-08: 2 g via INTRAVENOUS

## 2019-12-07 MED ORDER — LACTATED RINGERS IV SOLN: INTRAVENOUS | Status: DC

## 2019-12-08 ENCOUNTER — Ambulatory Visit: Payer: Medicaid Other | Admitting: Urgent Care

## 2019-12-08 ENCOUNTER — Ambulatory Visit
Admission: RE | Admit: 2019-12-08 | Discharge: 2019-12-08 | Disposition: A | Discharge status: Home / Self Care | Payer: Medicaid Other | Attending: General Surgery | Admitting: General Surgery

## 2019-12-08 ENCOUNTER — Encounter
Admission: RE | Disposition: A | Discharge status: Home / Self Care | Payer: Self-pay | Source: Home / Self Care | Attending: General Surgery

## 2019-12-08 ENCOUNTER — Encounter: Payer: Self-pay | Admitting: General Surgery

## 2019-12-08 ENCOUNTER — Other Ambulatory Visit: Payer: Self-pay

## 2019-12-08 ENCOUNTER — Ambulatory Visit
Admission: RE | Admit: 2019-12-08 | Discharge: 2019-12-08 | Disposition: A | Discharge status: Home / Self Care | Payer: Medicaid Other | Source: Ambulatory Visit | Attending: General Surgery | Admitting: General Surgery

## 2019-12-08 DIAGNOSIS — Z881 Allergy status to other antibiotic agents status: Secondary | ICD-10-CM | POA: Insufficient documentation

## 2019-12-08 DIAGNOSIS — Z7951 Long term (current) use of inhaled steroids: Secondary | ICD-10-CM | POA: Insufficient documentation

## 2019-12-08 DIAGNOSIS — Z809 Family history of malignant neoplasm, unspecified: Secondary | ICD-10-CM | POA: Insufficient documentation

## 2019-12-08 DIAGNOSIS — Z8249 Family history of ischemic heart disease and other diseases of the circulatory system: Secondary | ICD-10-CM | POA: Diagnosis not present

## 2019-12-08 DIAGNOSIS — Z90710 Acquired absence of both cervix and uterus: Secondary | ICD-10-CM | POA: Insufficient documentation

## 2019-12-08 DIAGNOSIS — Z888 Allergy status to other drugs, medicaments and biological substances status: Secondary | ICD-10-CM | POA: Diagnosis not present

## 2019-12-08 DIAGNOSIS — Z9109 Other allergy status, other than to drugs and biological substances: Secondary | ICD-10-CM | POA: Insufficient documentation

## 2019-12-08 DIAGNOSIS — N6021 Fibroadenosis of right breast: Secondary | ICD-10-CM | POA: Insufficient documentation

## 2019-12-08 DIAGNOSIS — R928 Other abnormal and inconclusive findings on diagnostic imaging of breast: Secondary | ICD-10-CM

## 2019-12-08 DIAGNOSIS — N6489 Other specified disorders of breast: Secondary | ICD-10-CM | POA: Diagnosis present

## 2019-12-08 DIAGNOSIS — Z885 Allergy status to narcotic agent status: Secondary | ICD-10-CM | POA: Insufficient documentation

## 2019-12-08 DIAGNOSIS — Z811 Family history of alcohol abuse and dependence: Secondary | ICD-10-CM | POA: Diagnosis not present

## 2019-12-08 DIAGNOSIS — Z79899 Other long term (current) drug therapy: Secondary | ICD-10-CM | POA: Diagnosis not present

## 2019-12-08 DIAGNOSIS — Z886 Allergy status to analgesic agent status: Secondary | ICD-10-CM | POA: Diagnosis not present

## 2019-12-08 DIAGNOSIS — N6081 Other benign mammary dysplasias of right breast: Secondary | ICD-10-CM | POA: Diagnosis not present

## 2019-12-08 DIAGNOSIS — J449 Chronic obstructive pulmonary disease, unspecified: Secondary | ICD-10-CM | POA: Insufficient documentation

## 2019-12-08 DIAGNOSIS — Z8616 Personal history of COVID-19: Secondary | ICD-10-CM | POA: Diagnosis not present

## 2019-12-08 DIAGNOSIS — N6310 Unspecified lump in the right breast, unspecified quadrant: Secondary | ICD-10-CM

## 2019-12-08 DIAGNOSIS — Z883 Allergy status to other anti-infective agents status: Secondary | ICD-10-CM | POA: Diagnosis not present

## 2019-12-08 DIAGNOSIS — Z791 Long term (current) use of non-steroidal anti-inflammatories (NSAID): Secondary | ICD-10-CM | POA: Diagnosis not present

## 2019-12-08 HISTORY — PX: BREAST LUMPECTOMY WITH RADIOFREQUENCY TAG IDENTIFICATION: SHX6884

## 2019-12-08 SURGERY — BREAST LUMPECTOMY WITH RADIOFREQUENCY TAG IDENTIFICATION
Anesthesia: General | Laterality: Right

## 2019-12-08 MED ORDER — FENTANYL CITRATE (PF) 100 MCG/2ML IJ SOLN
INTRAMUSCULAR | Status: DC | PRN
  Administered 2019-12-08 (×2): 25 ug via INTRAVENOUS

## 2019-12-08 MED ORDER — DEXAMETHASONE SODIUM PHOSPHATE 10 MG/ML IJ SOLN
INTRAMUSCULAR | Status: AC
  Filled 2019-12-08: qty 1

## 2019-12-08 MED ORDER — FENTANYL CITRATE (PF) 100 MCG/2ML IJ SOLN
25.0000 ug | INTRAMUSCULAR | Status: DC | PRN
  Administered 2019-12-08: 25 ug via INTRAVENOUS

## 2019-12-08 MED ORDER — EPHEDRINE SULFATE 50 MG/ML IJ SOLN
INTRAMUSCULAR | Status: DC | PRN
  Administered 2019-12-08 (×3): 5 mg via INTRAVENOUS

## 2019-12-08 MED ORDER — PROPOFOL 10 MG/ML IV BOLUS
INTRAVENOUS | Status: DC | PRN
  Administered 2019-12-08: 130 mg via INTRAVENOUS

## 2019-12-08 MED ORDER — CEFAZOLIN SODIUM-DEXTROSE 2-4 GM/100ML-% IV SOLN
INTRAVENOUS | Status: AC
  Filled 2019-12-08: qty 100

## 2019-12-08 MED ORDER — FENTANYL CITRATE (PF) 100 MCG/2ML IJ SOLN
INTRAMUSCULAR | Status: AC
  Administered 2019-12-08: 25 ug via INTRAVENOUS
  Filled 2019-12-08: qty 2

## 2019-12-08 MED ORDER — FENTANYL CITRATE (PF) 100 MCG/2ML IJ SOLN
INTRAMUSCULAR | Status: AC
  Filled 2019-12-08: qty 2

## 2019-12-08 MED ORDER — MIDAZOLAM HCL 2 MG/2ML IJ SOLN
INTRAMUSCULAR | Status: AC
  Filled 2019-12-08: qty 2

## 2019-12-08 MED ORDER — PHENYLEPHRINE HCL (PRESSORS) 10 MG/ML IV SOLN
INTRAVENOUS | Status: DC | PRN
  Administered 2019-12-08: 100 ug via INTRAVENOUS

## 2019-12-08 MED ORDER — ACETAMINOPHEN 500 MG PO TABS: 1000.0000 mg | ORAL_TABLET | Freq: Four times a day (QID) | ORAL | 0 refills | Status: AC

## 2019-12-08 MED ORDER — PROPOFOL 10 MG/ML IV BOLUS
INTRAVENOUS | Status: AC
  Filled 2019-12-08: qty 20

## 2019-12-08 MED ORDER — OXYCODONE HCL 5 MG PO TABS: 5.0000 mg | ORAL_TABLET | Freq: Once | ORAL | Status: DC | PRN

## 2019-12-08 MED ORDER — OXYCODONE HCL 5 MG/5ML PO SOLN: 5.0000 mg | Freq: Once | ORAL | Status: DC | PRN

## 2019-12-08 MED ORDER — TRAMADOL HCL 50 MG PO TABS
ORAL_TABLET | ORAL | Status: AC
  Filled 2019-12-08: qty 1

## 2019-12-08 MED ORDER — ONDANSETRON HCL 4 MG/2ML IJ SOLN
INTRAMUSCULAR | Status: AC
  Filled 2019-12-08: qty 2

## 2019-12-08 MED ORDER — ONDANSETRON HCL 4 MG/2ML IJ SOLN
INTRAMUSCULAR | Status: DC | PRN
  Administered 2019-12-08: 4 mg via INTRAVENOUS

## 2019-12-08 MED ORDER — TRAMADOL HCL 50 MG PO TABS
50.0000 mg | ORAL_TABLET | Freq: Once | ORAL | Status: AC
  Administered 2019-12-08: 50 mg via ORAL

## 2019-12-08 MED ORDER — ACETAMINOPHEN 500 MG PO TABS
ORAL_TABLET | ORAL | Status: AC
  Administered 2019-12-08: 1000 mg via ORAL
  Filled 2019-12-08: qty 2

## 2019-12-08 MED ORDER — CHLORHEXIDINE GLUCONATE CLOTH 2 % EX PADS: 6.0000 | MEDICATED_PAD | Freq: Once | CUTANEOUS | Status: DC

## 2019-12-08 MED ORDER — LIDOCAINE HCL (CARDIAC) PF 100 MG/5ML IV SOSY
PREFILLED_SYRINGE | INTRAVENOUS | Status: DC | PRN
  Administered 2019-12-08: 60 mg via INTRAVENOUS

## 2019-12-08 MED ORDER — TRAMADOL HCL 50 MG PO TABS
50.0000 mg | ORAL_TABLET | Freq: Four times a day (QID) | ORAL | 0 refills | Status: DC | PRN
Start: 2019-12-08 — End: 2020-04-28

## 2019-12-08 MED ORDER — LIDOCAINE-EPINEPHRINE 1 %-1:100000 IJ SOLN
INTRAMUSCULAR | Status: DC | PRN
  Administered 2019-12-08: 5 mL via INTRAMUSCULAR

## 2019-12-08 MED ORDER — DEXAMETHASONE SODIUM PHOSPHATE 10 MG/ML IJ SOLN
INTRAMUSCULAR | Status: DC | PRN
  Administered 2019-12-08: 5 mg via INTRAVENOUS

## 2019-12-08 SURGICAL SUPPLY — 48 items
ADH SKN CLS APL DERMABOND .7 (GAUZE/BANDAGES/DRESSINGS) ×1
APL PRP STRL LF DISP 70% ISPRP (MISCELLANEOUS) ×1
BINDER BREAST MEDIUM (GAUZE/BANDAGES/DRESSINGS) ×2 IMPLANT
BLADE SURG 15 STRL LF DISP TIS (BLADE) ×2 IMPLANT
BLADE SURG 15 STRL SS (BLADE) ×4
CHLORAPREP W/TINT 26 (MISCELLANEOUS) ×2 IMPLANT
CLIP VESOCCLUDE SM WIDE 6/CT (CLIP) ×2 IMPLANT
CNTNR SPEC 2.5X3XGRAD LEK (MISCELLANEOUS)
CONT SPEC 4OZ STER OR WHT (MISCELLANEOUS)
CONT SPEC 4OZ STRL OR WHT (MISCELLANEOUS)
CONTAINER SPEC 2.5X3XGRAD LEK (MISCELLANEOUS) IMPLANT
COVER WAND RF STERILE (DRAPES) ×2 IMPLANT
DERMABOND ADVANCED (GAUZE/BANDAGES/DRESSINGS) ×1
DERMABOND ADVANCED .7 DNX12 (GAUZE/BANDAGES/DRESSINGS) ×1 IMPLANT
DEVICE DSSCT PLSMBLD 3.0S LGHT (MISCELLANEOUS) ×1 IMPLANT
DEVICE DUBIN SPECIMEN MAMMOGRA (MISCELLANEOUS) ×2 IMPLANT
DRAPE LAPAROTOMY 77X122 PED (DRAPES) ×2 IMPLANT
DRSG GAUZE FLUFF 36X18 (GAUZE/BANDAGES/DRESSINGS) ×2 IMPLANT
ELECT CAUTERY BLADE TIP 2.5 (TIP) ×2
ELECT REM PT RETURN 9FT ADLT (ELECTROSURGICAL) ×2
ELECTRODE CAUTERY BLDE TIP 2.5 (TIP) ×1 IMPLANT
ELECTRODE REM PT RTRN 9FT ADLT (ELECTROSURGICAL) ×1 IMPLANT
GLOVE BIO SURGEON STRL SZ 6.5 (GLOVE) ×4 IMPLANT
GLOVE INDICATOR 7.0 STRL GRN (GLOVE) ×6 IMPLANT
GOWN STRL REUS W/ TWL LRG LVL3 (GOWN DISPOSABLE) ×2 IMPLANT
GOWN STRL REUS W/TWL LRG LVL3 (GOWN DISPOSABLE) ×4
KIT MARKER MARGIN INK (KITS) ×2 IMPLANT
KIT TURNOVER KIT A (KITS) ×2 IMPLANT
LABEL OR SOLS (LABEL) ×2 IMPLANT
MANIFOLD NEPTUNE II (INSTRUMENTS) ×2 IMPLANT
MARKER MARGIN CORRECT CLIP (MARKER) IMPLANT
NEEDLE HYPO 25X1 1.5 SAFETY (NEEDLE) ×2 IMPLANT
PACK BASIN MINOR (MISCELLANEOUS) ×2 IMPLANT
PLASMABLADE 3.0S W/LIGHT (MISCELLANEOUS) ×2
SET LOCALIZER 20 PROBE US (MISCELLANEOUS) ×2 IMPLANT
STRIP CLOSURE SKIN 1/2X4 (GAUZE/BANDAGES/DRESSINGS) ×4 IMPLANT
SUT MNCRL 4-0 (SUTURE) ×2
SUT MNCRL 4-0 27XMFL (SUTURE) ×1
SUT SILK 2 0 SH (SUTURE) IMPLANT
SUT VIC AB 2-0 SH 27 (SUTURE) ×2
SUT VIC AB 2-0 SH 27XBRD (SUTURE) ×1 IMPLANT
SUT VIC AB 3-0 SH 27 (SUTURE) ×2
SUT VIC AB 3-0 SH 27X BRD (SUTURE) ×1 IMPLANT
SUTURE MNCRL 4-0 27XMF (SUTURE) ×1 IMPLANT
SYR 10ML LL (SYRINGE) ×2 IMPLANT
SYR BULB IRRIG 60ML STRL (SYRINGE) ×2 IMPLANT
TUBING CONNECTING 10 (TUBING) ×2 IMPLANT
WATER STERILE IRR 1000ML POUR (IV SOLUTION) ×2 IMPLANT
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
## 2019-12-08 NOTE — Op Note (Signed)
Operative note  Indications: This patient presents with history of a right breast abnormal mammogram. Despite multiple benign biopsies, radiology continues to raise the concern of ongoing tissue distortion and recommended excision. The patient expressed a desire to proceed with lumpectomy. Radiofrequency identification chip was placed by radiology prior to the procedure and the mammograms were reviewed.  Pre-operative Diagnosis: right breast tissue distortion  Post-operative Diagnosis: right breast tissue distortion  Procedure: Radiofrequency identification chip-localized right breast lumpectomy and tissue rearrangement of 3 x 4 cm (12 cm)  Surgeon: Fredirick Maudlin   Assistants: None  Anesthesia: General LMA anesthesia  Procedure Details  The patient was seen in the Holding Room. The risks, benefits, complications, treatment options, and expected outcomes were discussed with the patient. The possibilities of reaction to medication, pulmonary aspiration, bleeding, infection, the need for additional procedures, failure to diagnose a condition, and creating a complication requiring transfusion or operation were discussed with the patient. The patient concurred with the proposed plan, giving informed consent. The site of surgery properly noted/marked. The patient was taken to Operating Room, identified as Erin Richards, and the procedure verified as lumpectomy. A time out was held and the above information confirmed.  After induction of anesthesia, the right breast and chest were prepped and draped in standard fashion. The radiofrequency chip locator was used to determine the site of the incision. A one-to-one mixture of 0.25% bupivacaine and 1% lidocaine with epinephrine was injected along the tissue planes. An oblique incision was made overlying the area of the RF ID chip. The PlasmaBlade electrocautery was used to core out the breast tissue containing the RF ID chip. It was marked with the  standard paint kit and an onsite mammogram performed that confirmed the presence of 3 breast biopsy clips from prior procedures, as well as the RF ID chip. It was sent as a specimen. The wound bed was irrigated. There was a modest tissue defect present that would potentially provide an unsatisfactory cosmetic result. I therefore mobilized several centimeters of breast tissue and rearranged them to cover and fill the defect. The tissue was secured with interrupted 2-0 Vicryl suture. The area of rearrangement measured 3 x 4 cm. The skin was reapproximated with interrupted 3-0 Vicryl and the skin was closed with running subcuticular 4-0 Monocryl. The skin was cleaned. Dermabond and Steri-Strips were applied. Gauze fluffs and a breast binder were then used as a final dressing.   At the end of the operation, all sponge, instrument, and needle counts were correct.  Findings: RF ID chip and 3 biopsy clips identified within the surgical specimen  Estimated Blood Loss:  Minimal         Drains: None         Total IV Fluids: See anesthesia record         Specimens: Right breast mass             Complications:  None; patient tolerated the procedure well.         Disposition: Stable to the postanesthesia care unit with plan to discharge home after recovery.         Condition: Stable

## 2019-12-08 NOTE — Transfer of Care (Signed)
Immediate Anesthesia Transfer of Care Note  Patient: Erin Richards  Procedure(s) Performed: BREAST LUMPECTOMY WITH RADIOFREQUENCY TAG IDENTIFICATION (Right )  Patient Location: PACU  Anesthesia Type:General  Level of Consciousness: drowsy  Airway & Oxygen Therapy: Patient Spontanous Breathing and Patient connected to face mask oxygen  Post-op Assessment: Report given to RN and Post -op Vital signs reviewed and stable  Post vital signs: Reviewed and stable  Last Vitals:  Vitals Value Taken Time  BP 130/82 12/08/19 1027  Temp    Pulse 79 12/08/19 1027  Resp 13 12/08/19 1028  SpO2 100 % 12/08/19 1027  Vitals shown include unvalidated device data.  Last Pain:  Vitals:   12/08/19 0826  TempSrc: Temporal  PainSc: 4          Complications: No complications documented.

## 2019-12-08 NOTE — Interval H&P Note (Signed)
History and Physical Interval Note:  12/08/2019 9:06 AM  Erin Richards  has presented today for surgery, with the diagnosis of Right breast abnormalities.  The various methods of treatment have been discussed with the patient and family. After consideration of risks, benefits and other options for treatment, the patient has consented to  Procedure(s): BREAST LUMPECTOMY WITH RADIOFREQUENCY TAG IDENTIFICATION (Right) as a surgical intervention.  The patient's history has been reviewed, patient examined, no change in status, stable for surgery.  I have reviewed the patient's chart and labs.  Questions were answered to the patient's satisfaction.     Fredirick Maudlin

## 2019-12-08 NOTE — Discharge Instructions (Signed)

## 2019-12-08 NOTE — Anesthesia Postprocedure Evaluation (Signed)
Anesthesia Post Note  Patient: Erin Richards  Procedure(s) Performed: BREAST LUMPECTOMY WITH RADIOFREQUENCY TAG IDENTIFICATION (Right )  Patient location during evaluation: PACU Anesthesia Type: General Level of consciousness: awake and alert Pain management: pain level controlled Vital Signs Assessment: post-procedure vital signs reviewed and stable Respiratory status: spontaneous breathing, nonlabored ventilation, respiratory function stable and patient connected to nasal cannula oxygen Cardiovascular status: blood pressure returned to baseline and stable Postop Assessment: no apparent nausea or vomiting Anesthetic complications: no   No complications documented.   Last Vitals:  Vitals:   12/08/19 1137 12/08/19 1238  BP: (!) 111/49 (!) 102/56  Pulse: 85 83  Resp: 16 14  Temp: 37.2 C   SpO2: 94% 98%    Last Pain:  Vitals:   12/08/19 1238  TempSrc:   PainSc: 2                  Erin Richards

## 2019-12-08 NOTE — Anesthesia Procedure Notes (Signed)
Procedure Name: LMA Insertion Date/Time: 12/08/2019 9:25 AM Performed by: Demetrius Charity, CRNA Pre-anesthesia Checklist: Patient identified, Patient being monitored, Timeout performed, Emergency Drugs available and Suction available Patient Re-evaluated:Patient Re-evaluated prior to induction Oxygen Delivery Method: Circle system utilized Preoxygenation: Pre-oxygenation with 100% oxygen Induction Type: IV induction Ventilation: Mask ventilation without difficulty LMA: LMA inserted LMA Size: 3.5 Tube type: Oral Number of attempts: 1 Placement Confirmation: positive ETCO2 and breath sounds checked- equal and bilateral Tube secured with: Tape Dental Injury: Teeth and Oropharynx as per pre-operative assessment

## 2019-12-09 ENCOUNTER — Encounter: Payer: Self-pay | Admitting: General Surgery

## 2019-12-11 LAB — SURGICAL PATHOLOGY

## 2019-12-21 ENCOUNTER — Ambulatory Visit (INDEPENDENT_AMBULATORY_CARE_PROVIDER_SITE_OTHER): Payer: Medicaid Other | Admitting: General Surgery

## 2019-12-21 ENCOUNTER — Other Ambulatory Visit: Payer: Self-pay

## 2019-12-21 ENCOUNTER — Encounter: Payer: Self-pay | Admitting: General Surgery

## 2019-12-21 VITALS — BP 109/72 | HR 92 | Temp 97.3°F | Ht <= 58 in | Wt 137.6 lb

## 2019-12-21 DIAGNOSIS — Z9889 Other specified postprocedural states: Secondary | ICD-10-CM

## 2019-12-21 NOTE — Patient Instructions (Addendum)
Dr.Cannon suggest patient to do one more week with the compression bra before going back to a regular bra. Dr.Cannon advised patient she may try massaging Vitamin E oil or Cocoa Butter on the breast to help with scarring. Patient advised to have a repeat mammogram in one year.  Breast Self-Awareness Breast self-awareness is knowing how your breasts look and feel. Doing breast self-awareness is important. It allows you to catch a breast problem early while it is still small and can be treated. All women should do breast self-awareness, including women who have had breast implants. Tell your doctor if you notice a change in your breasts. What you need:  A mirror.  A well-lit room. How to do a breast self-exam A breast self-exam is one way to learn what is normal for your breasts and to check for changes. To do a breast self-exam: Look for changes  1. Take off all the clothes above your waist. 2. Stand in front of a mirror in a room with good lighting. 3. Put your hands on your hips. 4. Push your hands down. 5. Look at your breasts and nipples in the mirror to see if one breast or nipple looks different from the other. Check to see if: ? The shape of one breast is different. ? The size of one breast is different. ? There are wrinkles, dips, and bumps in one breast and not the other. 6. Look at each breast for changes in the skin, such as: ? Redness. ? Scaly areas. 7. Look for changes in your nipples, such as: ? Liquid around the nipples. ? Bleeding. ? Dimpling. ? Redness. ? A change in where the nipples are. Feel for changes  1. Lie on your back on the floor. 2. Feel each breast. To do this, follow these steps: ? Pick a breast to feel. ? Put the arm closest to that breast above your head. ? Use your other arm to feel the nipple area of your breast. Feel the area with the pads of your three middle fingers by making small circles with your fingers. For the first circle, press  lightly. For the second circle, press harder. For the third circle, press even harder. ? Keep making circles with your fingers at the different pressures as you move down your breast. Stop when you feel your ribs. ? Move your fingers a little toward the center of your body. ? Start making circles with your fingers again, this time going up until you reach your collarbone. ? Keep making up-and-down circles until you reach your armpit. Remember to keep using the three pressures. ? Feel the other breast in the same way. 3. Sit or stand in the tub or shower. 4. With soapy water on your skin, feel each breast the same way you did in step 2 when you were lying on the floor. Write down what you find Writing down what you find can help you remember what to tell your doctor. Write down:  What is normal for each breast.  Any changes you find in each breast, including: ? The kind of changes you find. ? Whether you have pain. ? Size and location of any lumps.  When you last had your menstrual period. General tips  Check your breasts every month.  If you are breastfeeding, the best time to check your breasts is after you feed your baby or after you use a breast pump.  If you get menstrual periods, the best time to check your breasts  is 5-7 days after your menstrual period is over.  With time, you will become comfortable with the self-exam, and you will begin to know if there are changes in your breasts. Contact a doctor if you:  See a change in the shape or size of your breasts or nipples.  See a change in the skin of your breast or nipples, such as red or scaly skin.  Have fluid coming from your nipples that is not normal.  Find a lump or thick area that was not there before.  Have pain in your breasts.  Have any concerns about your breast health. Summary  Breast self-awareness includes looking for changes in your breasts, as well as feeling for changes within your breasts.  Breast  self-awareness should be done in front of a mirror in a well-lit room.  You should check your breasts every month. If you get menstrual periods, the best time to check your breasts is 5-7 days after your menstrual period is over.  Let your doctor know of any changes you see in your breasts, including changes in size, changes on the skin, pain or tenderness, or fluid from your nipples that is not normal. This information is not intended to replace advice given to you by your health care provider. Make sure you discuss any questions you have with your health care provider. Document Revised: 09/07/2017 Document Reviewed: 09/07/2017 Elsevier Patient Education  [place].

## 2019-12-21 NOTE — Progress Notes (Signed)
Erin Richards is here today for a postoperative visit.  She is a 54 year old woman who has undergone multiple breast biopsies for an area of distortion in her right breast.  At her last mammogram, the area of distortion appeared more concerning to the radiologist and in order to avoid undergoing multiple future biopsies, [name] elected to undergo radiofrequency ID chip localized lumpectomy.  As expected, the final pathology was benign.  She has been doing well since her operation.  She denies any fevers or chills.  No nausea or vomiting.  Her pain has been well controlled.  She is wondering when she can stop wearing the compression bra.  Pathololgy:  A. BREAST, RIGHT; EXCISION:  - BENIGN MAMMARY PARENCHYMA WITH PSEUDOANGIOMATOUS STROMAL HYPERPLASIA,  WITH BACKGROUND DENSE STROMAL FIBROSIS AND SCLEROSING ADENOSIS.  - CLIPS X3, RF TAG, AND BIOPSY SITE CHANGES IDENTIFIED.  - NEGATIVE FOR ATYPICAL PROLIFERATIVE BREAST DISEASE.   Today's Vitals   12/21/19 0936  BP: 109/72  Pulse: 92  Temp: (!) 97.3 F (36.3 C)  TempSrc: Oral  SpO2: 96%  Weight: 137 lb 9.6 oz (62.4 kg)  Height: 4\' 9"  (1.448 m)  PainSc: 0-No pain  PainLoc: Breast   Body mass index is 29.78 kg/m. Focused examination demonstrates that her Steri-Strips are still in place.  These were removed to reveal a well approximated incision.  There is some periincisional bruising still present, but no fluctuance or suggestion of fluid collection.  Impression and plan: This is a 54 year old woman who is now status post RF ID localized lumpectomy.  Her pathology was benign.  I encouraged her to continue wearing the compression bra for another week.  Scar care was discussed.  She may resume all of her usual activities.  She should have a screening mammogram in 1 year.  I will see her on an as-needed basis.

## 2020-02-09 ENCOUNTER — Other Ambulatory Visit: Payer: Medicaid Other

## 2020-02-09 ENCOUNTER — Ambulatory Visit: Payer: Self-pay

## 2020-02-09 ENCOUNTER — Other Ambulatory Visit: Payer: Self-pay | Admitting: Physician Assistant

## 2020-02-09 DIAGNOSIS — R110 Nausea: Secondary | ICD-10-CM

## 2020-02-09 DIAGNOSIS — Z20822 Contact with and (suspected) exposure to covid-19: Secondary | ICD-10-CM

## 2020-02-09 MED ORDER — PROMETHAZINE HCL 12.5 MG PO TABS: 12.5000 mg | ORAL_TABLET | Freq: Three times a day (TID) | ORAL | 2 refills | Status: DC | PRN

## 2020-02-09 MED ORDER — ONDANSETRON 4 MG PO TBDP: 4.0000 mg | ORAL_TABLET | Freq: Four times a day (QID) | ORAL | 1 refills | Status: DC | PRN

## 2020-02-09 NOTE — Telephone Encounter (Signed)
Have sent prescription for Zofran to her pharmacy.  She needs to go to ER if she is unable to keep down fluids, or if any blood in stool or emesis.

## 2020-02-09 NOTE — Telephone Encounter (Signed)
Advised patient as below.  

## 2020-02-09 NOTE — Telephone Encounter (Signed)
Patient called and says she thinks she has food poisoning. She says her and her boyfriend ate at a restaurant on Tuesday night, the exact same food, and at 0100 Wednesday morning she started vomiting and he started at 0130. She says she's having abdominal cramping right before she vomits. She says she had diarrhea, but not since yesterday. She says she's vomited probably 6 times or more in the past 24 hours. She says she's able to keep Sprite and ice chips down, but any food makes her vomit. She says she's been having cold/hot chills, night sweating. I asked is she running a fever, she says it's 99 now, but it's been as high as 101. I advised she may need to be COVID tested, she says it's not COVID it's food poisoning. I advised no availability at the office today, I called to speak to [place], [facility] to verify. Patient asked where can she go to get COVID tested and if Dr. Caryn Section would call her in something for nausea/vomiting in the meantime. I spoke to [address], [facility] and she says for the patient to be at the office today at 1730 for COVID testing. I advised the patient to go for testing and advised I will send this over for Dr. Caryn Section to review and someone will call with his recommendation about the nausea medication.   Reason for Disposition . [1] MILD or MODERATE vomiting AND [2] present > 48 hours (2 days) (Exception: mild vomiting with associated diarrhea)  Answer Assessment - Initial Assessment Questions 1. VOMITING SEVERITY: "How many times have you vomited in the past 24 hours?"     - MILD:  1 - 2 times/day    - MODERATE: 3 - 5 times/day, decreased oral intake without significant weight loss or symptoms of dehydration    - SEVERE: 6 or more times/day, vomits everything or nearly everything, with significant weight loss, symptoms of dehydration      Severe-more than 6 2. ONSET: "When did the vomiting begin?"      Wednesday morning at 0100 3. FLUIDS: "What fluids or food have you vomited up today?" "Have  you been able to keep any fluids down?"     Sprite and ice chips, no food 4. ABDOMINAL PAIN: "Are your having any abdominal pain?" If yes : "How bad is it and what does it feel like?" (e.g., crampy, dull, intermittent, constant)      Yes only vomiting, cramping 5. DIARRHEA: "Is there any diarrhea?" If Yes, ask: "How many times today?"      Not since yesterday 6. CONTACTS: "Is there anyone else in the family with the same symptoms?"      Yes, boyfriend 7. CAUSE: "What do you think is causing your vomiting?"     Food poisoning because we at the same thing on Tuesday night at restaurant 8. HYDRATION STATUS: "Any signs of dehydration?" (e.g., dry mouth [not only dry lips], too weak to stand) "When did you last urinate?"     Dry lips 9. OTHER SYMPTOMS: "Do you have any other symptoms?" (e.g., fever, headache, vertigo, vomiting blood or coffee grounds, recent head injury)      Fever-99, headache, weakness 10. PREGNANCY: "Is there any chance you are pregnant?" "When was your last menstrual period?"       No  Protocols used: [facility]

## 2020-02-11 ENCOUNTER — Encounter: Payer: Self-pay | Admitting: Family Medicine

## 2020-02-13 LAB — NOVEL CORONAVIRUS, NAA: SARS-CoV-2, NAA: NOT DETECTED

## 2020-02-20 ENCOUNTER — Telehealth: Payer: Medicaid Other | Admitting: Adult Health

## 2020-02-21 ENCOUNTER — Encounter: Payer: Self-pay | Admitting: Family Medicine

## 2020-02-21 ENCOUNTER — Telehealth (INDEPENDENT_AMBULATORY_CARE_PROVIDER_SITE_OTHER): Payer: Medicaid Other | Admitting: Family Medicine

## 2020-02-21 DIAGNOSIS — R109 Unspecified abdominal pain: Secondary | ICD-10-CM

## 2020-02-21 DIAGNOSIS — R059 Cough, unspecified: Secondary | ICD-10-CM | POA: Diagnosis not present

## 2020-02-21 MED ORDER — HYOSCYAMINE SULFATE 0.125 MG PO TABS: ORAL_TABLET | ORAL | 1 refills | Status: DC

## 2020-02-21 MED ORDER — CEFDINIR 300 MG PO CAPS: 600.0000 mg | ORAL_CAPSULE | Freq: Every day | ORAL | 0 refills | Status: AC
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
## 2020-02-21 NOTE — Patient Instructions (Signed)
.   Please review the attached list of medications and notify my office if there are any errors.   . Please bring all of your medications to every appointment so we can make sure that our medication list is the same as yours.   

## 2020-02-23 ENCOUNTER — Telehealth: Payer: Self-pay | Admitting: Family Medicine

## 2020-02-23 NOTE — Telephone Encounter (Signed)
Pt calling to let PCP know that she still is not feeling better. She states that she was diagnosed with a bronchial infection. She states that she is still coughing up green mucus. Pt is requesting to have PCP send something else in for her. Please advise.     [id number] - [place],  - [address]  [address] [place] [address]  Phone: [phone] Fax: [phone]  Hours: Not open 24 hours

## 2020-02-23 NOTE — Telephone Encounter (Signed)
Recommend using plain Mucinex BID to help clear congestion and she should finish all the antibiotic sent to her pharmacy by Dr. Caryn Section on 02-21-20. May need to consider test for COVID or chest x-ray if no better by Tuesday 02-27-20.

## 2020-02-24 ENCOUNTER — Ambulatory Visit
Admission: EM | Admit: 2020-02-24 | Discharge: 2020-02-24 | Disposition: A | Discharge status: Home / Self Care | Payer: Medicaid Other | Attending: Emergency Medicine | Admitting: Emergency Medicine

## 2020-02-24 ENCOUNTER — Encounter: Payer: Self-pay | Admitting: Emergency Medicine

## 2020-02-24 ENCOUNTER — Ambulatory Visit (INDEPENDENT_AMBULATORY_CARE_PROVIDER_SITE_OTHER): Payer: Medicaid Other

## 2020-02-24 ENCOUNTER — Other Ambulatory Visit: Payer: Self-pay

## 2020-02-24 DIAGNOSIS — J069 Acute upper respiratory infection, unspecified: Secondary | ICD-10-CM | POA: Insufficient documentation

## 2020-02-24 DIAGNOSIS — Z20822 Contact with and (suspected) exposure to covid-19: Secondary | ICD-10-CM | POA: Diagnosis not present

## 2020-02-24 DIAGNOSIS — R0602 Shortness of breath: Secondary | ICD-10-CM

## 2020-02-24 DIAGNOSIS — R059 Cough, unspecified: Secondary | ICD-10-CM

## 2020-02-24 DIAGNOSIS — J208 Acute bronchitis due to other specified organisms: Secondary | ICD-10-CM | POA: Insufficient documentation

## 2020-02-24 MED ORDER — LEVOCETIRIZINE DIHYDROCHLORIDE 5 MG PO TABS
5.0000 mg | ORAL_TABLET | Freq: Every evening | ORAL | 1 refills | Status: DC
Start: 2020-02-24 — End: 2020-05-02

## 2020-02-24 MED ORDER — PREDNISONE 10 MG (21) PO TBPK: ORAL_TABLET | ORAL | 0 refills | Status: DC

## 2020-02-24 MED ORDER — GUAIFENESIN 300 MG/15ML PO SOLN
15.0000 mL | ORAL | 0 refills | Status: DC | PRN
Start: 2020-02-24 — End: 2020-04-28

## 2020-02-24 NOTE — Discharge Instructions (Addendum)
You were seen for cough and are being treated for bronchitis.   You were tested for COVID-19. If you know you were exposed to a confirmed case of COVID-19, continue quarantine until your test results are available.  Interact as little as possible until COVID-19 test results are available.  Continue to keep her social distance of 6 feet from others, wash hands frequently and wear facemask when indoors or when you are unable to social distance in outdoor settings.  If you develop any symptoms, reach out to the urgent care for further instructions.  If your test results are positive, a member of the urgent care team will reach out to you with further instructions. If you have any further questions, please don't hesitate to reach out to the urgent care clinic.  Over the counter medications that will help your symptoms: Flonase (runny nose, congestion), Zyrtec or Xyzal (postnasal drip, sneezing), Tylenol/Ibuprofen (fever and body aches).  Please sign up for MyChart to access your lab results.  Take your new medications as prescribed. Make sure you continue to humidifier while at home. Follow-up with your primary care provider if your symptoms do not get better; follow-up at an urgent care if your symptoms worsen.  Take care, Dr. Marland Kitchen, NP-c
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
## 2020-02-24 NOTE — ED Triage Notes (Signed)
Patient was diagnosed with Bronchitis earlier this week.  Patient states that she is currently on an antibiotic.  Patient states that her cough and chest congestion has not gotten any better.  Patient denies fevers.

## 2020-02-26 LAB — SARS CORONAVIRUS 2 (TAT 6-24 HRS): SARS Coronavirus 2: NEGATIVE

## 2020-02-26 NOTE — Telephone Encounter (Signed)
Proceed with medications prescribed by Dr. Caryn Section. Recheck with Dr. Caryn Section if no better after finishing the antibiotic and prednisone taper.

## 2020-02-26 NOTE — Telephone Encounter (Addendum)
Patient advised. She states she went to urgent care on Saturday 02/24/2020 and was started on prednisone 10mg  taper, Xyzal 5mg  daily and guaifenesin cough medication. She is still taking the Levsin antibiotic that Dr. Caryn Section prescribed. She has been using her albuterol inhaler. Patient states the urgent care did a chest x ray and a COVID test which was negative. Patient says she is still not feeling any better today. Her symptoms are the same as when she had her virtual visit with Dr. Caryn Section on 02/21/2020. Please advise on further recommendations.

## 2020-02-27 ENCOUNTER — Telehealth: Payer: Self-pay

## 2020-02-27 ENCOUNTER — Encounter: Payer: Self-pay | Admitting: Family Medicine

## 2020-02-27 NOTE — Telephone Encounter (Signed)
Copied from [place] [id number]. Topic: General - Other >> Feb 27, 2020 11:54 AM Yvette Rack wrote: Reason for CRM: Pt stated she still is not feeling well and she has been having to use her oxygen even more. Pt requests that Dr. Maralyn Sago nurse return her call. Cb# [phone]

## 2020-02-27 NOTE — Telephone Encounter (Signed)
Pt called and says symptoms are not getting better. Pt is worried due to her heart condition and wants to know if she should be on the antibiotic longer. Pt is scheduled to end the antibiotic tomorrow and the prednisone taper within the next 3 days. I told pt to finish taper and antibiotic and reschedule an appt if symptoms progress.

## 2020-02-28 ENCOUNTER — Other Ambulatory Visit: Payer: Self-pay | Admitting: Physician Assistant

## 2020-02-28 ENCOUNTER — Other Ambulatory Visit: Payer: Self-pay | Admitting: Family Medicine

## 2020-02-28 DIAGNOSIS — F32A Depression, unspecified: Secondary | ICD-10-CM

## 2020-02-28 DIAGNOSIS — J301 Allergic rhinitis due to pollen: Secondary | ICD-10-CM

## 2020-02-28 MED ORDER — FLUTICASONE-SALMETEROL 250-50 MCG/DOSE IN AEPB: 1.0000 | INHALATION_SPRAY | Freq: Two times a day (BID) | RESPIRATORY_TRACT | 1 refills | Status: DC

## 2020-02-28 NOTE — Addendum Note (Signed)
Addended by: Birdie Sons on: 02/28/2020 02:26 PM   Modules accepted: Orders

## 2020-02-28 NOTE — Telephone Encounter (Addendum)
Need to start steroid inhaler, have sent prescription to her [facility]. She needs to rinse out her mouth and gargle with salt after each use of the inhaler.

## 2020-02-28 NOTE — Telephone Encounter (Signed)
I called and spoke with patient. She reports her symptoms haven't really improved since last week. She will be taking the last dose of the antibiotic today that Dr. Caryn Section prescribed. She has 2 more days of prednisone to take that was prescribed by urgent care on 02/24/2020. Patient reports she is still having chest tightness, shortness of breath (even while at rest), sweats and chills. She denies any fever. Her temperature today was 98.3. She says she has been using her night time oxygen in the day time now. She is using 2L 02. Her oxygen saturation has been fluctuating between 92-96% without oxygen. When she uses the supplemental oxygen, her oxygen levels come up to 99%. Patient is requesting guidance or recommendations on what she should do since symptoms havent improved. Please advise.

## 2020-02-28 NOTE — Telephone Encounter (Signed)
Patient advised and verbalized understanding 

## 2020-03-01 ENCOUNTER — Encounter: Payer: Self-pay | Admitting: Family Medicine

## 2020-03-02 ENCOUNTER — Emergency Department: Payer: Medicaid Other

## 2020-03-02 ENCOUNTER — Encounter: Payer: Self-pay | Admitting: Radiology

## 2020-03-02 ENCOUNTER — Emergency Department
Admission: EM | Admit: 2020-03-02 | Discharge: 2020-03-02 | Disposition: A | Discharge status: Home / Self Care | Payer: Medicaid Other | Attending: Emergency Medicine | Admitting: Emergency Medicine

## 2020-03-02 ENCOUNTER — Other Ambulatory Visit: Payer: Self-pay

## 2020-03-02 DIAGNOSIS — R059 Cough, unspecified: Secondary | ICD-10-CM | POA: Diagnosis present

## 2020-03-02 DIAGNOSIS — J069 Acute upper respiratory infection, unspecified: Secondary | ICD-10-CM | POA: Insufficient documentation

## 2020-03-02 DIAGNOSIS — Z8616 Personal history of COVID-19: Secondary | ICD-10-CM | POA: Diagnosis not present

## 2020-03-02 DIAGNOSIS — R0989 Other specified symptoms and signs involving the circulatory and respiratory systems: Secondary | ICD-10-CM | POA: Insufficient documentation

## 2020-03-02 LAB — CBC WITH DIFFERENTIAL/PLATELET
Abs Immature Granulocytes: 0.06 10*3/uL (ref 0.00–0.07)
Basophils Absolute: 0 10*3/uL (ref 0.0–0.1)
Basophils Relative: 0 %
Eosinophils Absolute: 0.1 10*3/uL (ref 0.0–0.5)
Eosinophils Relative: 1 %
HCT: 38.9 % (ref 36.0–46.0)
Hemoglobin: 13 g/dL (ref 12.0–15.0)
Immature Granulocytes: 1 %
Lymphocytes Relative: 14 %
Lymphs Abs: 1 10*3/uL (ref 0.7–4.0)
MCH: 27.9 pg (ref 26.0–34.0)
MCHC: 33.4 g/dL (ref 30.0–36.0)
MCV: 83.5 fL (ref 80.0–100.0)
Monocytes Absolute: 0.8 10*3/uL (ref 0.1–1.0)
Monocytes Relative: 11 %
Neutro Abs: 5.4 10*3/uL (ref 1.7–7.7)
Neutrophils Relative %: 73 %
Platelets: 143 10*3/uL — ABNORMAL LOW (ref 150–400)
RBC: 4.66 MIL/uL (ref 3.87–5.11)
RDW: 14.3 % (ref 11.5–15.5)
WBC: 7.3 10*3/uL (ref 4.0–10.5)
nRBC: 0 % (ref 0.0–0.2)

## 2020-03-02 LAB — URINALYSIS, COMPLETE (UACMP) WITH MICROSCOPIC
Bilirubin Urine: NEGATIVE
Glucose, UA: NEGATIVE mg/dL
Hgb urine dipstick: NEGATIVE
Ketones, ur: NEGATIVE mg/dL
Nitrite: NEGATIVE
Protein, ur: NEGATIVE mg/dL
Specific Gravity, Urine: 1.012 (ref 1.005–1.030)
pH: 8 (ref 5.0–8.0)

## 2020-03-02 LAB — COMPREHENSIVE METABOLIC PANEL
ALT: 15 U/L (ref 0–44)
AST: 23 U/L (ref 15–41)
Albumin: 3.7 g/dL (ref 3.5–5.0)
Alkaline Phosphatase: 71 U/L (ref 38–126)
Anion gap: 10 (ref 5–15)
BUN: 9 mg/dL (ref 6–20)
CO2: 29 mmol/L (ref 22–32)
Calcium: 9.2 mg/dL (ref 8.9–10.3)
Chloride: 95 mmol/L — ABNORMAL LOW (ref 98–111)
Creatinine, Ser: 0.72 mg/dL (ref 0.44–1.00)
GFR, Estimated: >60 mL/min (ref >60)
Glucose, Bld: 92 mg/dL (ref 70–99)
Potassium: 3.9 mmol/L (ref 3.5–5.1)
Sodium: 134 mmol/L — ABNORMAL LOW (ref 135–145)
Total Bilirubin: 1.4 mg/dL — ABNORMAL HIGH (ref 0.3–1.2)
Total Protein: 6.8 g/dL (ref 6.5–8.1)

## 2020-03-02 MED ORDER — IOHEXOL 300 MG/ML  SOLN
75.0000 mL | Freq: Once | INTRAMUSCULAR | Status: AC | PRN
  Administered 2020-03-02: 75 mL via INTRAVENOUS
  Filled 2020-03-02: qty 75

## 2020-03-02 MED ORDER — BENZONATATE 200 MG PO CAPS: 200.0000 mg | ORAL_CAPSULE | Freq: Three times a day (TID) | ORAL | 0 refills | Status: DC | PRN

## 2020-03-02 NOTE — Discharge Instructions (Addendum)
Monday to make an appointment with the pulmonologist over at [facility].  Dr. Teodoro Kil contact information is listed on your discharge papers.  Continue your regular medications including the albuterol and Spiriva.  Tessalon Perles was sent to your pharmacy as this is the only cough medication that you are not allergic to.  You may take this every 8 hours as needed for cough.  Chest x-ray and CT of your chest did not show any pneumonia or finding that would represent your cough.  More test may be necessary by the pulmonologist.
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
## 2020-03-02 NOTE — ED Notes (Signed)
Ambulated to bathroom  O2 sat ranges from 100 -96%  Will decreased slightly with cough

## 2020-03-02 NOTE — ED Notes (Signed)
See triage note  Presents with cough  States she has had productive cough and chest congestion for several days  Was dx'd with bronchitis  No fever  Had negative COVID test last week

## 2020-03-02 NOTE — ED Triage Notes (Signed)
Pt presents via POV c/o cough and SOB x2 weeks. Reports tested for Covid x2 in last 2 weeks with 2 negative tests. Reports last negative Covid test x1 week ago. Reports persistent productive cough and congestion. S/p PO abx, steroids, and inhaler per pt without relief.

## 2020-03-02 NOTE — ED Notes (Signed)
Patient ambulated to and from hallway bathroom with a steady gait. NAD.

## 2020-03-04 ENCOUNTER — Encounter: Payer: Self-pay | Admitting: Family Medicine

## 2020-03-04 ENCOUNTER — Telehealth: Payer: Self-pay

## 2020-03-04 DIAGNOSIS — R053 Chronic cough: Secondary | ICD-10-CM

## 2020-03-04 NOTE — Telephone Encounter (Signed)
Pt is calling back and would like the nurse to call her also

## 2020-03-04 NOTE — Telephone Encounter (Signed)
Copied from [place] [id number]. Topic: Referral - Request for Referral >> Mar 04, 2020  8:18 AM Keene Breath wrote: Has patient seen PCP for this complaint? no *If NO, is insurance requiring patient see PCP for this issue before PCP can refer them? Referral for which specialty: Pulmonary disease lung specialist Preferred provider/office: Patient would like to be referred to Dr. Marthe Patch Reason for referral: Patient stated she was told at the ER to get a referral for a lung specialist

## 2020-03-05 NOTE — Addendum Note (Signed)
Addended by: Birdie Sons on: 03/05/2020 03:22 PM   Modules accepted: Orders

## 2020-03-05 NOTE — Telephone Encounter (Signed)
Order placed for pulmonary referral.  

## 2020-03-06 ENCOUNTER — Telehealth (INDEPENDENT_AMBULATORY_CARE_PROVIDER_SITE_OTHER): Payer: Medicaid Other | Admitting: Family Medicine

## 2020-03-06 ENCOUNTER — Other Ambulatory Visit: Payer: Self-pay

## 2020-03-06 ENCOUNTER — Encounter: Payer: Self-pay | Admitting: Family Medicine

## 2020-03-06 DIAGNOSIS — I712 Thoracic aortic aneurysm, without rupture, unspecified: Secondary | ICD-10-CM

## 2020-03-06 DIAGNOSIS — R059 Cough, unspecified: Secondary | ICD-10-CM

## 2020-03-06 MED ORDER — AZITHROMYCIN 250 MG PO TABS
ORAL_TABLET | ORAL | 0 refills | Status: AC
Start: 2020-03-06 — End: 2020-03-11

## 2020-03-06 MED ORDER — PREDNISONE 10 MG (21) PO TBPK: ORAL_TABLET | ORAL | 0 refills | Status: DC
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
## 2020-03-20 ENCOUNTER — Other Ambulatory Visit: Payer: Self-pay | Admitting: Family Medicine

## 2020-03-20 ENCOUNTER — Other Ambulatory Visit: Payer: Self-pay | Admitting: Physician Assistant

## 2020-03-20 ENCOUNTER — Telehealth: Payer: Self-pay | Admitting: Family Medicine

## 2020-03-20 DIAGNOSIS — J301 Allergic rhinitis due to pollen: Secondary | ICD-10-CM

## 2020-03-20 DIAGNOSIS — F5102 Adjustment insomnia: Secondary | ICD-10-CM

## 2020-03-20 DIAGNOSIS — F32A Depression, unspecified: Secondary | ICD-10-CM

## 2020-03-20 DIAGNOSIS — J4540 Moderate persistent asthma, uncomplicated: Secondary | ICD-10-CM

## 2020-03-20 DIAGNOSIS — F5101 Primary insomnia: Secondary | ICD-10-CM

## 2020-03-20 DIAGNOSIS — R21 Rash and other nonspecific skin eruption: Secondary | ICD-10-CM

## 2020-03-20 NOTE — Telephone Encounter (Signed)
Requested medication (s) are due for refill today: yes  Requested medication (s) are on the active medication list: yes  Last refill:  02/16/2020  Future visit scheduled: no  Notes to clinic:  this refill cannot be delegated  Other medication have been d/c  Requested Prescriptions  Pending Prescriptions Disp Refills   zolpidem (AMBIEN) 5 MG tablet [Pharmacy Med Name: ZOLPIDEM TARTRATE 5 MG TABLET] 30 tablet 2    Sig: TAKE 1 TABLET BY MOUTH AT BEDTIME AS NEEDED FOR SLEEP.      Not Delegated - Psychiatry:  Anxiolytics/Hypnotics Failed - 03/20/2020  9:00 AM      Failed - This refill cannot be delegated      Failed - Urine Drug Screen completed in last 360 days      Passed - Valid encounter within last 6 months    Recent Outpatient Visits           2 weeks ago Cough   [facility] Birdie Sons, MD   4 weeks ago Cough   [facility] Birdie Sons, MD   4 months ago Abnormal mammogram of right breast   [facility] Birdie Sons, MD   7 months ago Depression, unspecified depression type   [facility] Birdie Sons, MD   7 months ago Depression, unspecified depression type   [facility] [place], PA-C                  citalopram (CELEXA) 10 MG tablet [Pharmacy Med Name: CITALOPRAM HBR 10 MG TABLET] 90 tablet 4    Sig: TAKE 1 [place]      Psychiatry:  Antidepressants - SSRI Passed - 03/20/2020  9:00 AM      Passed - Completed PHQ-2 or PHQ-9 in the last 360 days      Passed - Valid encounter within last 6 months    Recent Outpatient Visits           2 weeks ago Cough   [facility] Birdie Sons, MD   4 weeks ago Cough   [facility] Birdie Sons, MD   4 months ago Abnormal mammogram of right breast   [facility] Birdie Sons, MD   7 months ago Depression, unspecified depression type   [facility] Birdie Sons, MD   7 months ago Depression, unspecified depression type   [place], [place], PA-C                  gabapentin (NEURONTIN) 300 MG capsule [Pharmacy Med Name: GABAPENTIN 300 MG CAPSULE] 90 capsule 4    Sig: TAKE 1 [place]      Neurology: Anticonvulsants - gabapentin Passed - 03/20/2020  9:00 AM      Passed - Valid encounter within last 12 months    Recent Outpatient Visits           2 weeks ago Cough   [facility] Birdie Sons, MD   4 weeks ago Cough   [facility] Birdie Sons, MD   4 months ago Abnormal mammogram of right breast   [facility] Birdie Sons, MD   7 months ago Depression, unspecified depression type   [facility] Birdie Sons, MD   7 months ago Depression, unspecified depression type   [facility] [place], [place]  M, PA-C                  traZODone (DESYREL) 100 MG tablet [Pharmacy Med Name: TRAZODONE 100 MG TABLET] 90 tablet 4    Sig: TAKE 1 TABLET BY MOUTH EVERYDAY AT BEDTIME      Psychiatry: Antidepressants - Serotonin Modulator Passed - 03/20/2020  9:00 AM      Passed - Completed PHQ-2 or PHQ-9 in the last 360 days      Passed - Valid encounter within last 6 months    Recent Outpatient Visits           2 weeks ago Cough   [facility] Birdie Sons, MD   4 weeks ago Cough   [facility] Birdie Sons, MD   4 months ago Abnormal mammogram of right breast   [facility] Birdie Sons, MD   7 months ago Depression, unspecified depression type   [facility] Birdie Sons, MD   7 months ago Depression, unspecified depression type   [place], Adriana M, [place]                 Signed Prescriptions Disp Refills   COMBIVENT RESPIMAT 20-100 MCG/ACT AERS respimat 4 g 1    Sig: INHALE 1  PUFF INTO THE LUNGS EVERY 4 HOURS AS NEEDED FOR WHEEZING.      Pulmonology:  Combination Products Passed - 03/20/2020  9:00 AM      Passed - Valid encounter within last 12 months    Recent Outpatient Visits           2 weeks ago Cough   [facility] Birdie Sons, MD   4 weeks ago Cough   [facility] Birdie Sons, MD   4 months ago Abnormal mammogram of right breast   [facility] Birdie Sons, MD   7 months ago Depression, unspecified depression type   [facility] Birdie Sons, MD   7 months ago Depression, unspecified depression type   [place], [place], [place]

## 2020-03-20 NOTE — Telephone Encounter (Signed)
Medication: gabapentin (NEURONTIN) 300 MG capsule   Has the pt contacted their pharmacy? Yes but no response  Pt was prescribed more than she needed on her last refills.  So she had extra.  Now she is out and states she really needs this med, as she takes every night and does not have one for tonight.  Preferred pharmacy: [id number] - [place], [place] [address]  Please be advised refills may take up to 3 business days.  We ask that you follow up with your pharmacy.

## 2020-03-20 NOTE — Telephone Encounter (Signed)
Patient calling back checking on the status of her gabapentin request, stating she would like request expedited, patient has 1 pill,  Patient also would like to know if PCP can send her cetirizine (ZYRTEC) 10 MG tablet in at the same time.   Informed caller awaiting PCP response   [id number] - [place], [place] - [address] Phone:  [phone]  Fax:  [phone]

## 2020-03-21 MED ORDER — GABAPENTIN 300 MG PO CAPS: 300.0000 mg | ORAL_CAPSULE | Freq: Every day | ORAL | 2 refills | Status: DC

## 2020-03-21 MED ORDER — CETIRIZINE HCL 10 MG PO TABS: 10.0000 mg | ORAL_TABLET | Freq: Every day | ORAL | 2 refills | Status: DC

## 2020-03-25 ENCOUNTER — Ambulatory Visit (INDEPENDENT_AMBULATORY_CARE_PROVIDER_SITE_OTHER): Payer: Medicaid Other | Admitting: Vascular Surgery

## 2020-03-25 ENCOUNTER — Other Ambulatory Visit: Payer: Self-pay

## 2020-03-25 ENCOUNTER — Encounter (INDEPENDENT_AMBULATORY_CARE_PROVIDER_SITE_OTHER): Payer: Self-pay | Admitting: Vascular Surgery

## 2020-03-25 VITALS — BP 121/73 | HR 90 | Resp 16 | Ht <= 58 in | Wt 146.4 lb

## 2020-03-25 DIAGNOSIS — M4802 Spinal stenosis, cervical region: Secondary | ICD-10-CM

## 2020-03-25 DIAGNOSIS — K219 Gastro-esophageal reflux disease without esophagitis: Secondary | ICD-10-CM

## 2020-03-25 DIAGNOSIS — I7121 Aneurysm of the ascending aorta, without rupture: Secondary | ICD-10-CM

## 2020-03-25 DIAGNOSIS — I712 Thoracic aortic aneurysm, without rupture: Secondary | ICD-10-CM | POA: Diagnosis not present

## 2020-03-25 DIAGNOSIS — J4540 Moderate persistent asthma, uncomplicated: Secondary | ICD-10-CM
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
## 2020-03-29 ENCOUNTER — Encounter (INDEPENDENT_AMBULATORY_CARE_PROVIDER_SITE_OTHER): Payer: Self-pay | Admitting: Vascular Surgery

## 2020-03-29 DIAGNOSIS — I7121 Aneurysm of the ascending aorta, without rupture: Secondary | ICD-10-CM | POA: Insufficient documentation

## 2020-03-29 DIAGNOSIS — I712 Thoracic aortic aneurysm, without rupture: Secondary | ICD-10-CM | POA: Insufficient documentation

## 2020-04-02 ENCOUNTER — Other Ambulatory Visit: Payer: Self-pay | Admitting: Physician Assistant

## 2020-04-02 DIAGNOSIS — F32A Depression, unspecified: Secondary | ICD-10-CM

## 2020-04-17 ENCOUNTER — Other Ambulatory Visit: Payer: Self-pay | Admitting: Family Medicine

## 2020-04-17 ENCOUNTER — Other Ambulatory Visit: Payer: Self-pay | Admitting: Physician Assistant

## 2020-04-17 DIAGNOSIS — R21 Rash and other nonspecific skin eruption: Secondary | ICD-10-CM

## 2020-04-28 ENCOUNTER — Inpatient Hospital Stay
Admission: EM | Admit: 2020-04-28 | Discharge: 2020-05-02 | DRG: 315 | Disposition: A | Discharge status: Home / Self Care | Payer: Medicaid Other | Source: Ambulatory Visit | Attending: Family Medicine | Admitting: Family Medicine

## 2020-04-28 ENCOUNTER — Ambulatory Visit (INDEPENDENT_AMBULATORY_CARE_PROVIDER_SITE_OTHER)
Admission: EM | Admit: 2020-04-28 | Discharge: 2020-04-28 | Disposition: A | Discharge status: Other Acute Inpatient Hospital | Payer: Medicaid Other | Source: Home / Self Care | Attending: Family Medicine | Admitting: Family Medicine

## 2020-04-28 ENCOUNTER — Emergency Department: Payer: Medicaid Other

## 2020-04-28 ENCOUNTER — Observation Stay: Payer: Medicaid Other

## 2020-04-28 ENCOUNTER — Other Ambulatory Visit: Payer: Self-pay

## 2020-04-28 ENCOUNTER — Encounter: Payer: Self-pay | Admitting: Emergency Medicine

## 2020-04-28 DIAGNOSIS — Z881 Allergy status to other antibiotic agents status: Secondary | ICD-10-CM

## 2020-04-28 DIAGNOSIS — I454 Nonspecific intraventricular block: Secondary | ICD-10-CM | POA: Diagnosis present

## 2020-04-28 DIAGNOSIS — G5600 Carpal tunnel syndrome, unspecified upper limb: Secondary | ICD-10-CM | POA: Diagnosis present

## 2020-04-28 DIAGNOSIS — M545 Low back pain, unspecified: Secondary | ICD-10-CM | POA: Diagnosis present

## 2020-04-28 DIAGNOSIS — Z8774 Personal history of (corrected) congenital malformations of heart and circulatory system: Secondary | ICD-10-CM

## 2020-04-28 DIAGNOSIS — R768 Other specified abnormal immunological findings in serum: Secondary | ICD-10-CM | POA: Diagnosis present

## 2020-04-28 DIAGNOSIS — K648 Other hemorrhoids: Secondary | ICD-10-CM | POA: Diagnosis present

## 2020-04-28 DIAGNOSIS — I4510 Unspecified right bundle-branch block: Secondary | ICD-10-CM | POA: Diagnosis present

## 2020-04-28 DIAGNOSIS — R4182 Altered mental status, unspecified: Secondary | ICD-10-CM | POA: Insufficient documentation

## 2020-04-28 DIAGNOSIS — E162 Hypoglycemia, unspecified: Secondary | ICD-10-CM | POA: Diagnosis present

## 2020-04-28 DIAGNOSIS — E561 Deficiency of vitamin K: Secondary | ICD-10-CM | POA: Diagnosis present

## 2020-04-28 DIAGNOSIS — J45909 Unspecified asthma, uncomplicated: Secondary | ICD-10-CM | POA: Diagnosis present

## 2020-04-28 DIAGNOSIS — R55 Syncope and collapse: Secondary | ICD-10-CM | POA: Diagnosis present

## 2020-04-28 DIAGNOSIS — Z955 Presence of coronary angioplasty implant and graft: Secondary | ICD-10-CM

## 2020-04-28 DIAGNOSIS — Z791 Long term (current) use of non-steroidal anti-inflammatories (NSAID): Secondary | ICD-10-CM

## 2020-04-28 DIAGNOSIS — Q211 Atrial septal defect, unspecified: Secondary | ICD-10-CM

## 2020-04-28 DIAGNOSIS — K219 Gastro-esophageal reflux disease without esophagitis: Secondary | ICD-10-CM | POA: Diagnosis present

## 2020-04-28 DIAGNOSIS — K589 Irritable bowel syndrome without diarrhea: Secondary | ICD-10-CM | POA: Diagnosis present

## 2020-04-28 DIAGNOSIS — Z8619 Personal history of other infectious and parasitic diseases: Secondary | ICD-10-CM

## 2020-04-28 DIAGNOSIS — I959 Hypotension, unspecified: Principal | ICD-10-CM | POA: Diagnosis present

## 2020-04-28 DIAGNOSIS — Z9981 Dependence on supplemental oxygen: Secondary | ICD-10-CM

## 2020-04-28 DIAGNOSIS — F330 Major depressive disorder, recurrent, mild: Secondary | ICD-10-CM | POA: Diagnosis present

## 2020-04-28 DIAGNOSIS — M25512 Pain in left shoulder: Secondary | ICD-10-CM | POA: Diagnosis present

## 2020-04-28 DIAGNOSIS — Z90710 Acquired absence of both cervix and uterus: Secondary | ICD-10-CM

## 2020-04-28 DIAGNOSIS — Z20822 Contact with and (suspected) exposure to covid-19: Secondary | ICD-10-CM | POA: Diagnosis present

## 2020-04-28 DIAGNOSIS — I071 Rheumatic tricuspid insufficiency: Secondary | ICD-10-CM | POA: Diagnosis present

## 2020-04-28 DIAGNOSIS — Z888 Allergy status to other drugs, medicaments and biological substances status: Secondary | ICD-10-CM

## 2020-04-28 DIAGNOSIS — G43909 Migraine, unspecified, not intractable, without status migrainosus: Secondary | ICD-10-CM | POA: Diagnosis present

## 2020-04-28 DIAGNOSIS — M4802 Spinal stenosis, cervical region: Secondary | ICD-10-CM | POA: Diagnosis present

## 2020-04-28 DIAGNOSIS — Z9889 Other specified postprocedural states: Secondary | ICD-10-CM

## 2020-04-28 DIAGNOSIS — M5030 Other cervical disc degeneration, unspecified cervical region: Secondary | ICD-10-CM | POA: Diagnosis present

## 2020-04-28 DIAGNOSIS — Z7951 Long term (current) use of inhaled steroids: Secondary | ICD-10-CM

## 2020-04-28 DIAGNOSIS — Z8616 Personal history of COVID-19: Secondary | ICD-10-CM

## 2020-04-28 DIAGNOSIS — G8929 Other chronic pain: Secondary | ICD-10-CM | POA: Diagnosis present

## 2020-04-28 DIAGNOSIS — Z79899 Other long term (current) drug therapy: Secondary | ICD-10-CM

## 2020-04-28 DIAGNOSIS — G629 Polyneuropathy, unspecified: Secondary | ICD-10-CM | POA: Diagnosis present

## 2020-04-28 DIAGNOSIS — J309 Allergic rhinitis, unspecified: Secondary | ICD-10-CM | POA: Diagnosis present

## 2020-04-28 DIAGNOSIS — G459 Transient cerebral ischemic attack, unspecified: Secondary | ICD-10-CM | POA: Insufficient documentation

## 2020-04-28 DIAGNOSIS — Z885 Allergy status to narcotic agent status: Secondary | ICD-10-CM

## 2020-04-28 DIAGNOSIS — F419 Anxiety disorder, unspecified: Secondary | ICD-10-CM | POA: Diagnosis present

## 2020-04-28 LAB — COMPREHENSIVE METABOLIC PANEL
ALT: 10 U/L (ref 0–44)
AST: 22 U/L (ref 15–41)
Albumin: 3.9 g/dL (ref 3.5–5.0)
Alkaline Phosphatase: 52 U/L (ref 38–126)
Anion gap: 4 — ABNORMAL LOW (ref 5–15)
BUN: 14 mg/dL (ref 6–20)
CO2: 26 mmol/L (ref 22–32)
Calcium: 8.6 mg/dL — ABNORMAL LOW (ref 8.9–10.3)
Chloride: 111 mmol/L (ref 98–111)
Creatinine, Ser: 0.54 mg/dL (ref 0.44–1.00)
GFR, Estimated: >60 mL/min (ref >60)
Glucose, Bld: 76 mg/dL (ref 70–99)
Potassium: 3.5 mmol/L (ref 3.5–5.1)
Sodium: 141 mmol/L (ref 135–145)
Total Bilirubin: 1 mg/dL (ref 0.3–1.2)
Total Protein: 6.6 g/dL (ref 6.5–8.1)

## 2020-04-28 LAB — URINALYSIS, COMPLETE (UACMP) WITH MICROSCOPIC
Bacteria, UA: NONE SEEN
Bilirubin Urine: NEGATIVE
Glucose, UA: NEGATIVE mg/dL
Hgb urine dipstick: NEGATIVE
Ketones, ur: NEGATIVE mg/dL
Leukocytes,Ua: NEGATIVE
Nitrite: NEGATIVE
Protein, ur: NEGATIVE mg/dL
Specific Gravity, Urine: 1.006 (ref 1.005–1.030)
Squamous Epithelial / HPF: NONE SEEN (ref 0–5)
WBC, UA: NONE SEEN WBC/hpf (ref 0–5)
pH: 6 (ref 5.0–8.0)

## 2020-04-28 LAB — RESP PANEL BY RT-PCR (FLU A&B, COVID) ARPGX2
Influenza A by PCR: NEGATIVE
Influenza B by PCR: NEGATIVE
SARS Coronavirus 2 by RT PCR: NEGATIVE

## 2020-04-28 LAB — DIFFERENTIAL
Abs Immature Granulocytes: 0.02 10*3/uL (ref 0.00–0.07)
Basophils Absolute: 0 10*3/uL (ref 0.0–0.1)
Basophils Relative: 0 %
Eosinophils Absolute: 0.1 10*3/uL (ref 0.0–0.5)
Eosinophils Relative: 1 %
Immature Granulocytes: 0 %
Lymphocytes Relative: 37 %
Lymphs Abs: 1.8 10*3/uL (ref 0.7–4.0)
Monocytes Absolute: 0.4 10*3/uL (ref 0.1–1.0)
Monocytes Relative: 8 %
Neutro Abs: 2.5 10*3/uL (ref 1.7–7.7)
Neutrophils Relative %: 54 %

## 2020-04-28 LAB — CBC
HCT: 38.3 % (ref 36.0–46.0)
Hemoglobin: 12.3 g/dL (ref 12.0–15.0)
MCH: 27.5 pg (ref 26.0–34.0)
MCHC: 32.1 g/dL (ref 30.0–36.0)
MCV: 85.7 fL (ref 80.0–100.0)
Platelets: 155 10*3/uL (ref 150–400)
RBC: 4.47 MIL/uL (ref 3.87–5.11)
RDW: 13.9 % (ref 11.5–15.5)
WBC: 4.8 10*3/uL (ref 4.0–10.5)
nRBC: 0 % (ref 0.0–0.2)

## 2020-04-28 LAB — URINE DRUG SCREEN, QUALITATIVE (ARMC ONLY)
Amphetamines, Ur Screen: NOT DETECTED
Barbiturates, Ur Screen: NOT DETECTED
Benzodiazepine, Ur Scrn: NOT DETECTED
Cannabinoid 50 Ng, Ur ~~LOC~~: NOT DETECTED
Cocaine Metabolite,Ur ~~LOC~~: NOT DETECTED
MDMA (Ecstasy)Ur Screen: NOT DETECTED
Methadone Scn, Ur: NOT DETECTED
Opiate, Ur Screen: NOT DETECTED
Phencyclidine (PCP) Ur S: NOT DETECTED
Tricyclic, Ur Screen: POSITIVE — AB

## 2020-04-28 LAB — GLUCOSE, CAPILLARY: Glucose-Capillary: 87 mg/dL (ref 70–99)

## 2020-04-28 LAB — CBG MONITORING, ED: Glucose-Capillary: 72 mg/dL (ref 70–99)

## 2020-04-28 LAB — PROTIME-INR
INR: 1 (ref 0.8–1.2)
Prothrombin Time: 12.6 seconds (ref 11.4–15.2)

## 2020-04-28 LAB — TROPONIN I (HIGH SENSITIVITY)
Troponin I (High Sensitivity): 3 ng/L (ref <18)
Troponin I (High Sensitivity): 3 ng/L (ref <18)

## 2020-04-28 LAB — APTT: aPTT: 33 seconds (ref 24–36)

## 2020-04-28 LAB — BRAIN NATRIURETIC PEPTIDE: B Natriuretic Peptide: 77.2 pg/mL (ref 0.0–100.0)

## 2020-04-28 MED ORDER — MONTELUKAST SODIUM 10 MG PO TABS
10.0000 mg | ORAL_TABLET | Freq: Every day | ORAL | Status: DC
  Administered 2020-04-29 – 2020-05-02 (×4): 10 mg via ORAL
  Filled 2020-04-28 (×4): qty 1

## 2020-04-28 MED ORDER — ALBUTEROL SULFATE HFA 108 (90 BASE) MCG/ACT IN AERS
1.0000 | INHALATION_SPRAY | Freq: Four times a day (QID) | RESPIRATORY_TRACT | Status: DC | PRN
  Filled 2020-04-28: qty 6.7

## 2020-04-28 MED ORDER — CITALOPRAM HYDROBROMIDE 20 MG PO TABS
10.0000 mg | ORAL_TABLET | Freq: Every day | ORAL | Status: DC
  Administered 2020-04-29 – 2020-05-02 (×4): 10 mg via ORAL
  Filled 2020-04-28 (×4): qty 1

## 2020-04-28 MED ORDER — FLUTICASONE PROPIONATE 50 MCG/ACT NA SUSP
2.0000 | Freq: Every day | NASAL | Status: DC | PRN
  Filled 2020-04-28: qty 16

## 2020-04-28 MED ORDER — NORTRIPTYLINE HCL 25 MG PO CAPS: 25.0000 mg | ORAL_CAPSULE | Freq: Every evening | ORAL | Status: DC | PRN

## 2020-04-28 MED ORDER — TIOTROPIUM BROMIDE MONOHYDRATE 18 MCG IN CAPS
18.0000 ug | ORAL_CAPSULE | Freq: Every day | RESPIRATORY_TRACT | Status: DC | PRN
  Filled 2020-04-28: qty 5

## 2020-04-28 MED ORDER — NORTRIPTYLINE HCL 25 MG PO CAPS
25.0000 mg | ORAL_CAPSULE | Freq: Every day | ORAL | Status: DC
  Administered 2020-04-28 – 2020-05-01 (×4): 25 mg via ORAL
  Filled 2020-04-28 (×5): qty 1

## 2020-04-28 MED ORDER — AZELASTINE HCL 0.1 % NA SOLN
1.0000 | Freq: Two times a day (BID) | NASAL | Status: DC | PRN
  Filled 2020-04-28: qty 30

## 2020-04-28 MED ORDER — LORATADINE 10 MG PO TABS
10.0000 mg | ORAL_TABLET | Freq: Every day | ORAL | Status: DC
  Administered 2020-04-29 – 2020-05-02 (×4): 10 mg via ORAL
  Filled 2020-04-28 (×4): qty 1

## 2020-04-28 MED ORDER — IPRATROPIUM-ALBUTEROL 20-100 MCG/ACT IN AERS
1.0000 | INHALATION_SPRAY | Freq: Four times a day (QID) | RESPIRATORY_TRACT | Status: DC | PRN
  Filled 2020-04-28: qty 4

## 2020-04-28 MED ORDER — MONTELUKAST SODIUM 10 MG PO TABS
10.0000 mg | ORAL_TABLET | Freq: Every day | ORAL | Status: DC
  Filled 2020-04-28: qty 1

## 2020-04-28 MED ORDER — NORTRIPTYLINE HCL 25 MG PO CAPS: 25.0000 mg | ORAL_CAPSULE | Freq: Every day | ORAL | Status: DC

## 2020-04-28 MED ORDER — MELOXICAM 7.5 MG PO TABS: 15.0000 mg | ORAL_TABLET | Freq: Every day | ORAL | Status: DC

## 2020-04-28 MED ORDER — ACETAMINOPHEN 500 MG PO TABS
1000.0000 mg | ORAL_TABLET | Freq: Once | ORAL | Status: AC
  Administered 2020-04-28: 1000 mg via ORAL
  Filled 2020-04-28: qty 2

## 2020-04-28 MED ORDER — MELATONIN 5 MG PO TABS
5.0000 mg | ORAL_TABLET | Freq: Every evening | ORAL | Status: DC | PRN
  Filled 2020-04-28: qty 1

## 2020-04-28 MED ORDER — ONDANSETRON HCL 4 MG/2ML IJ SOLN
4.0000 mg | Freq: Once | INTRAMUSCULAR | Status: AC
  Administered 2020-04-28: 4 mg via INTRAVENOUS
  Filled 2020-04-28: qty 2

## 2020-04-28 MED ORDER — ADULT MULTIVITAMIN W/MINERALS CH
2.0000 | ORAL_TABLET | Freq: Every day | ORAL | Status: DC
  Administered 2020-04-29 – 2020-05-02 (×4): 2 via ORAL
  Filled 2020-04-28 (×4): qty 2

## 2020-04-28 MED ORDER — STROKE: EARLY STAGES OF RECOVERY BOOK: Freq: Once | Status: AC

## 2020-04-28 MED ORDER — MOMETASONE FURO-FORMOTEROL FUM 200-5 MCG/ACT IN AERO: 2.0000 | INHALATION_SPRAY | Freq: Two times a day (BID) | RESPIRATORY_TRACT | Status: DC

## 2020-04-28 MED ORDER — PANTOPRAZOLE SODIUM 20 MG PO TBEC: 20.0000 mg | DELAYED_RELEASE_TABLET | Freq: Every day | ORAL | Status: DC

## 2020-04-28 MED ORDER — ZOLPIDEM TARTRATE 5 MG PO TABS: 5.0000 mg | ORAL_TABLET | Freq: Every evening | ORAL | Status: DC | PRN

## 2020-04-28 MED ORDER — BENZONATATE 100 MG PO CAPS: 200.0000 mg | ORAL_CAPSULE | ORAL | Status: DC | PRN

## 2020-04-28 MED ORDER — PANTOPRAZOLE SODIUM 20 MG PO TBEC
20.0000 mg | DELAYED_RELEASE_TABLET | Freq: Every day | ORAL | Status: DC
  Administered 2020-04-30 – 2020-05-02 (×3): 20 mg via ORAL
  Filled 2020-04-28 (×3): qty 1

## 2020-04-28 NOTE — ED Triage Notes (Signed)
See first RN note; pt states last thing she remembers is being at church and walking out to the car and feeling weak. Pt alert and looking around at this time.    Pt oriented to place, year, situation, disoriented to month.   Pt c/o numbness to L side when touched, no drift noted, equal grip strength noted, no slurred speech, facial symmetry intact.

## 2020-04-28 NOTE — Plan of Care (Signed)

## 2020-04-28 NOTE — ED Notes (Signed)
Pt unable to tolerate BP while laying flat. Pt became dizzy and nauseated and BP dropped to 70/50. Pt also complaining of headache 10/10. Md Ellender Hose and Np susan made aware.

## 2020-04-28 NOTE — ED Notes (Signed)
Pt ambulatory to BR with 1 person standby assist.  SO states that pt is more her normal at this time.

## 2020-04-28 NOTE — ED Notes (Signed)
Patient is being discharged from the Urgent Care and sent to the Emergency Department via ambulance . Per Dr Lacinda Axon, patient is in need of higher level of care due to current symptoms. Patient is aware and verbalizes understanding of plan of care.  Vitals:   04/28/20 1332  BP: 124/73  Pulse: 76  Resp: 18  SpO2: 98%
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
## 2020-04-28 NOTE — ED Triage Notes (Signed)
Pt's friend c/o sudden onset disorientation and lethargy starting this morning. Pt is having difficulty walking and speaking. Pt is minimally alert and oriented, is able to answer questions. She does not recall any of the events this morning.

## 2020-04-28 NOTE — ED Triage Notes (Signed)
Pt in via EMS from [place]. EMS reports AMS for the last 24 hours. Pt has been very sleepy. No new meds, FSBS 87, 119/63, #20 g to right [facility]

## 2020-04-28 NOTE — ED Notes (Signed)
Request make for transport to the floor.
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
## 2020-04-28 NOTE — ED Notes (Signed)
Pt's care discussed with Dr. Tamala Julian. Pt states HA and some dizziness started last night, resolved, then started again today when she got to church. Pt states she got to church around 1030. Pt's care discussed with Dr. Tamala Julian, per Dr. Tamala Julian, no code stroke, however labs and CT scan ordered.

## 2020-04-29 DIAGNOSIS — Z955 Presence of coronary angioplasty implant and graft: Secondary | ICD-10-CM | POA: Diagnosis not present

## 2020-04-29 DIAGNOSIS — Z79899 Other long term (current) drug therapy: Secondary | ICD-10-CM | POA: Diagnosis not present

## 2020-04-29 DIAGNOSIS — R55 Syncope and collapse: Secondary | ICD-10-CM | POA: Diagnosis not present

## 2020-04-29 DIAGNOSIS — G459 Transient cerebral ischemic attack, unspecified: Secondary | ICD-10-CM | POA: Diagnosis present

## 2020-04-29 DIAGNOSIS — K219 Gastro-esophageal reflux disease without esophagitis: Secondary | ICD-10-CM | POA: Diagnosis present

## 2020-04-29 DIAGNOSIS — Z8619 Personal history of other infectious and parasitic diseases: Secondary | ICD-10-CM | POA: Diagnosis not present

## 2020-04-29 DIAGNOSIS — G629 Polyneuropathy, unspecified: Secondary | ICD-10-CM | POA: Diagnosis present

## 2020-04-29 DIAGNOSIS — I454 Nonspecific intraventricular block: Secondary | ICD-10-CM | POA: Diagnosis present

## 2020-04-29 DIAGNOSIS — F419 Anxiety disorder, unspecified: Secondary | ICD-10-CM | POA: Diagnosis present

## 2020-04-29 DIAGNOSIS — Z791 Long term (current) use of non-steroidal anti-inflammatories (NSAID): Secondary | ICD-10-CM | POA: Diagnosis not present

## 2020-04-29 DIAGNOSIS — Z9981 Dependence on supplemental oxygen: Secondary | ICD-10-CM | POA: Diagnosis not present

## 2020-04-29 DIAGNOSIS — I959 Hypotension, unspecified: Principal | ICD-10-CM

## 2020-04-29 DIAGNOSIS — G9341 Metabolic encephalopathy: Secondary | ICD-10-CM | POA: Diagnosis not present

## 2020-04-29 DIAGNOSIS — Z20822 Contact with and (suspected) exposure to covid-19: Secondary | ICD-10-CM | POA: Diagnosis present

## 2020-04-29 DIAGNOSIS — M4802 Spinal stenosis, cervical region: Secondary | ICD-10-CM | POA: Diagnosis present

## 2020-04-29 DIAGNOSIS — G5600 Carpal tunnel syndrome, unspecified upper limb: Secondary | ICD-10-CM | POA: Diagnosis present

## 2020-04-29 DIAGNOSIS — G8929 Other chronic pain: Secondary | ICD-10-CM | POA: Diagnosis present

## 2020-04-29 DIAGNOSIS — J45909 Unspecified asthma, uncomplicated: Secondary | ICD-10-CM | POA: Diagnosis present

## 2020-04-29 DIAGNOSIS — K589 Irritable bowel syndrome without diarrhea: Secondary | ICD-10-CM | POA: Diagnosis present

## 2020-04-29 DIAGNOSIS — M25512 Pain in left shoulder: Secondary | ICD-10-CM | POA: Diagnosis present

## 2020-04-29 DIAGNOSIS — Z7951 Long term (current) use of inhaled steroids: Secondary | ICD-10-CM | POA: Diagnosis not present

## 2020-04-29 DIAGNOSIS — E162 Hypoglycemia, unspecified: Secondary | ICD-10-CM | POA: Diagnosis present

## 2020-04-29 DIAGNOSIS — F330 Major depressive disorder, recurrent, mild: Secondary | ICD-10-CM | POA: Diagnosis present

## 2020-04-29 DIAGNOSIS — M5030 Other cervical disc degeneration, unspecified cervical region: Secondary | ICD-10-CM | POA: Diagnosis present

## 2020-04-29 DIAGNOSIS — I071 Rheumatic tricuspid insufficiency: Secondary | ICD-10-CM | POA: Diagnosis present

## 2020-04-29 DIAGNOSIS — G43909 Migraine, unspecified, not intractable, without status migrainosus: Secondary | ICD-10-CM | POA: Diagnosis present

## 2020-04-29 DIAGNOSIS — M545 Low back pain, unspecified: Secondary | ICD-10-CM | POA: Diagnosis present

## 2020-04-29 LAB — LIPID PANEL
Cholesterol: 148 mg/dL (ref 0–200)
HDL: 43 mg/dL (ref >40)
LDL Cholesterol: 89 mg/dL (ref 0–99)
Total CHOL/HDL Ratio: 3.4 RATIO
Triglycerides: 78 mg/dL (ref <150)
VLDL: 16 mg/dL (ref 0–40)

## 2020-04-29 LAB — HEMOGLOBIN A1C
Hgb A1c MFr Bld: 4.1 % — ABNORMAL LOW (ref 4.8–5.6)
Mean Plasma Glucose: 70.97 mg/dL

## 2020-04-29 LAB — HIV ANTIBODY (ROUTINE TESTING W REFLEX): HIV Screen 4th Generation wRfx: NONREACTIVE

## 2020-04-29 LAB — GLUCOSE, CAPILLARY
Glucose-Capillary: 117 mg/dL — ABNORMAL HIGH (ref 70–99)
Glucose-Capillary: 111 mg/dL — ABNORMAL HIGH (ref 70–99)
Glucose-Capillary: 119 mg/dL — ABNORMAL HIGH (ref 70–99)

## 2020-04-29 LAB — CORTISOL: Cortisol, Plasma: 5 ug/dL

## 2020-04-29 LAB — VITAMIN B12: Vitamin B-12: 316 pg/mL (ref 180–914)

## 2020-04-29 LAB — TSH: TSH: 1.435 u[IU]/mL (ref 0.350–4.500)

## 2020-04-29 MED ORDER — VITAMIN B-12 100 MCG PO TABS
100.0000 ug | ORAL_TABLET | Freq: Every day | ORAL | Status: DC
  Administered 2020-04-29 – 2020-04-30 (×2): 100 ug via ORAL
  Filled 2020-04-29 (×2): qty 1

## 2020-04-29 MED ORDER — DIPHENHYDRAMINE HCL 50 MG/ML IJ SOLN: 25.0000 mg | Freq: Once | INTRAMUSCULAR | Status: DC

## 2020-04-29 MED ORDER — DIPHENHYDRAMINE HCL 50 MG/ML IJ SOLN
25.0000 mg | Freq: Four times a day (QID) | INTRAMUSCULAR | Status: DC | PRN
  Administered 2020-04-29: 25 mg via INTRAVENOUS
  Filled 2020-04-29: qty 1

## 2020-04-29 MED ORDER — ACETAMINOPHEN 325 MG PO TABS
650.0000 mg | ORAL_TABLET | Freq: Once | ORAL | Status: AC
  Administered 2020-04-29: 11:00:00 650 mg via ORAL
  Filled 2020-04-29: qty 2

## 2020-04-29 MED ORDER — ACETAMINOPHEN 325 MG PO TABS
650.0000 mg | ORAL_TABLET | Freq: Four times a day (QID) | ORAL | Status: DC | PRN
  Administered 2020-04-29 – 2020-04-30 (×3): 650 mg via ORAL
  Filled 2020-04-29 (×4): qty 2

## 2020-04-29 MED ORDER — ONDANSETRON HCL 4 MG/2ML IJ SOLN
4.0000 mg | Freq: Four times a day (QID) | INTRAMUSCULAR | Status: DC | PRN
  Administered 2020-04-29: 4 mg via INTRAVENOUS
  Filled 2020-04-29: qty 2

## 2020-04-29 MED ORDER — SODIUM CHLORIDE 0.9 % IV SOLN: INTRAVENOUS | Status: DC

## 2020-04-29 MED ORDER — MAGNESIUM SULFATE 2 GM/50ML IV SOLN
2.0000 g | Freq: Once | INTRAVENOUS | Status: AC
  Administered 2020-04-29: 2 g via INTRAVENOUS
  Filled 2020-04-29: qty 50

## 2020-04-29 MED ORDER — PHENAZOPYRIDINE HCL 100 MG PO TABS
200.0000 mg | ORAL_TABLET | Freq: Three times a day (TID) | ORAL | Status: DC
  Administered 2020-04-29 – 2020-04-30 (×2): 200 mg via ORAL
  Filled 2020-04-29 (×4): qty 2

## 2020-04-29 MED ORDER — TRAMADOL HCL 50 MG PO TABS: 50.0000 mg | ORAL_TABLET | Freq: Four times a day (QID) | ORAL | Status: DC | PRN

## 2020-04-29 NOTE — Progress Notes (Addendum)
PROGRESS NOTE    Erin Richards  HUD:[id number] DOB: [birth year] DOA: 04/28/2020 PCP: Birdie Sons, MD   Brief Narrative: 55 year old with past medical history significant for atrial septal defect, multiple intracardiac stent, nonrheumatic pulmonary valve stenosis, bronchitis, depression, anxiety C. difficile colitis present after episode of confusion and possible syncope event.  Her significant other who helped provide history, they went to church, when they were walking into the store she was not feeling herself, she was feeling weak, she was leaning to the right.  He gave her drink and fruit snack in case that it was low blood sugar.  She felt a little bit better after she ate some.  After they leave church while driving in the car, she slumped her head down, was not very responsive.  She got very lethargic.  After she wake up at the urgent care or ED she was very confused she did not know where she was at.  She did not know what was her birthday.  Patient does not have any recollection of the events.  She has not been taking tramadol.  She has been taking Ambien for a long period of time. Her blood pressure was noted to be low in the ED at 70. She has had numbness of her fingertips for more than 3 weeks. She is complaining of Headaches today, frontal area.  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 -She has not been taking  tramadol.  Has been taking Ambien for a long period of time. -ECHO pending.  -Continue  to monitor on telemetry. -Check orthostatics vitals. -She denies bowel or urinary incontinence, no tongue bite. -Neurology has been consulted due to episode of confusion and headaches. -Start IV fluids. -Patient might  had episode of low blood sugar. hemoglobin A1c 4.1.  Will check CBG we will also check cortisol level to rule out adrenal insufficiency -addendum; For episodes of possible Hypoglycemia/Hypotension: check CBG, check sulfonylurea, check cortisol level. TSH.   2-Numbness: Could be related to neuropathy.  B12 low normal range 316.  Will start supplement.  3-Depression anxiety: Resume citalopram. Resume gabapentin if is okay with neurology  4-GERD: Continue with PPI Seasonal allergies: Continue with montelukast, Claritin.   Estimated body mass index is 30.3 kg/m as calculated from the following:   Height as of this encounter: 4\' 9"  (1.448 m).   Weight as of this encounter: 63.5 kg.   DVT prophylaxis: SCDs Code Status: Full code Family Communication: Significant other at bedside Disposition Plan:  Status is: Observation  The patient remains OBS appropriate and will d/c before 2 midnights.  Dispo: The patient is from: Home              Anticipated d/c is to: Home              Patient currently is not medically stable to d/c.   Difficult to place patient No  Consultants:   Neurology  Procedures:   Echo  Antimicrobials:    Subjective: Her significant other who helped provide history, they went to church, when they were walking into the store she was not feeling herself, she was feeling weak, she was leaning to the right.  He gave her drink and fruit snack in case that it was low blood sugar.  She felt a little bit better after she ate some.  After they leave church while driving in the car, she slumped her head down, was not very responsive.  She got very  lethargic.  After she wake up at the urgent care or ED she was very confused she did not know where she was at.  She did not know what was her birthday.  Patient does not have any recollection of the events.  She has not been taking tramadol.  She has been taking Ambien for a long period of time. Her blood pressure was noted to be low in the ED at 70. She has had numbness of her fingertips for more than 3 weeks. She is complaining of Headaches today, frontal area.   Objective: Vitals:   04/29/20 0850 04/29/20 1043 04/29/20 1045 04/29/20 1048  BP: (!) 103/44 (!) 102/53 (!) 100/47 120/73  Pulse: 78 80 76 85  Resp: 18 20 20 20   Temp: 97.9 F (36.6 C) 98.2 F (36.8 C) 97.9 F (36.6 C) 97.7 F (36.5 C)  TempSrc: Oral Oral Oral Oral  SpO2: 99% 98% 99% 100%  Weight:      Height:        Intake/Output Summary (Last 24 hours) at 04/29/2020 1313 Last data filed at 04/29/2020 0958 Gross per 24 hour  Intake 720 ml  Output -  Net 720 ml   Filed Weights   04/28/20 1422  Weight: 63.5 kg    Examination:  General exam: Appears calm and comfortable  Respiratory system: Clear to auscultation. Respiratory effort normal. Cardiovascular system: S1 & S2 heard, RRR. Gastrointestinal system: Abdomen is nondistended, soft and nontender. Central nervous system: Alert and oriented. No focal neurological deficits. Extremities: Symmetric 5 x 5 power. Skin: No rashes, lesions or ulcers     Data Reviewed: I have personally reviewed following labs and imaging studies  CBC: Recent Labs  Lab 04/28/20 1424  WBC 4.8  NEUTROABS 2.5  HGB 12.3  HCT 38.3  MCV 85.7  PLT 858   Basic Metabolic Panel: Recent Labs  Lab 04/28/20 1424  NA 141  K 3.5  CL 111  CO2 26  GLUCOSE 76  BUN 14  CREATININE 0.54  CALCIUM 8.6*   GFR: Estimated Creatinine Clearance: 63.1 mL/min (by C-G formula based on SCr of 0.54 mg/dL). Liver Function Tests: Recent Labs  Lab 04/28/20 1424  AST 22  ALT 10   ALKPHOS 52  BILITOT 1.0  PROT 6.6  ALBUMIN 3.9   No results for input(s): LIPASE, AMYLASE in the last 168 hours. No results for input(s): AMMONIA in the last 168 hours. Coagulation Profile: Recent Labs  Lab 04/28/20 1424  INR 1.0   Cardiac Enzymes: No results for input(s): CKTOTAL, CKMB, CKMBINDEX, TROPONINI in the last 168 hours. BNP (last 3 results) No results for input(s): PROBNP in the last 8760 hours. HbA1C: Recent Labs    04/29/20 0341  HGBA1C 4.1*   CBG: Recent Labs  Lab 04/28/20 1326 04/28/20 1439 04/29/20 1201  GLUCAP 87 72 117*   Lipid Profile: Recent Labs    04/29/20 0341  CHOL  148  HDL 43  LDLCALC 89  TRIG 78  CHOLHDL 3.4   Thyroid Function Tests: No results for input(s): TSH, T4TOTAL, FREET4, T3FREE, THYROIDAB in the last 72 hours. Anemia Panel: Recent Labs    04/29/20 0340  VITAMINB12 316   Sepsis Labs: No results for input(s): PROCALCITON, LATICACIDVEN in the last 168 hours.  Recent Results (from the past 240 hour(s))  Resp Panel by RT-PCR (Flu A&B, Covid) Nasopharyngeal Swab     Status: None   Collection Time: 04/28/20  6:40 PM   Specimen: Nasopharyngeal Swab; Nasopharyngeal(NP) swabs in vial transport medium  Result Value Ref Range Status   SARS Coronavirus 2 by RT PCR NEGATIVE NEGATIVE Final    Comment: (NOTE) SARS-CoV-2 target nucleic acids are NOT DETECTED.  The SARS-CoV-2 RNA is generally detectable in upper respiratory specimens during the acute phase of infection. The lowest concentration of SARS-CoV-2 viral copies this assay can detect is 138 copies/mL. A negative result does not preclude SARS-Cov-2 infection and should not be used as the sole basis for treatment or other patient management decisions. A negative result may occur with  improper specimen collection/handling, submission of specimen other than nasopharyngeal swab, presence of viral mutation(s) within the areas targeted by this assay, and inadequate number of  viral copies(<138 copies/mL). A negative result must be combined with clinical observations, patient history, and epidemiological information. The expected result is Negative.  Fact Sheet for Patients:  EntrepreneurPulse.com.au  Fact Sheet for Healthcare Providers:  IncredibleEmployment.be  This test is no t yet approved or cleared by the [place] FDA and  has been authorized for detection and/or diagnosis of SARS-CoV-2 by FDA under an Emergency Use Authorization (EUA). This EUA will remain  in effect (meaning this test can be used) for the duration of the COVID-19 declaration under Section 564(b)(1) of the Act, 21 U.S.C.section 360bbb-3(b)(1), unless the authorization is terminated  or revoked sooner.       Influenza A by PCR NEGATIVE NEGATIVE Final   Influenza B by PCR NEGATIVE NEGATIVE Final    Comment: (NOTE) The Xpert Xpress SARS-CoV-2/FLU/RSV plus assay is intended as an aid in the diagnosis of influenza from Nasopharyngeal swab specimens and should not be used as a sole basis for treatment. Nasal washings and aspirates are unacceptable for Xpert Xpress SARS-CoV-2/FLU/RSV testing.  Fact Sheet for Patients: EntrepreneurPulse.com.au  Fact Sheet for Healthcare Providers: IncredibleEmployment.be  This test is not yet approved or cleared by the [place] FDA and has been authorized for detection and/or diagnosis of SARS-CoV-2 by FDA under an Emergency Use Authorization (EUA). This EUA will remain in effect (meaning this test can be used) for the duration of the COVID-19 declaration under Section 564(b)(1) of the Act, 21 U.S.C. section 360bbb-3(b)(1), unless the authorization is terminated or revoked.  Performed at [facility], [address] [place], [place] [address]          Radiology Studies: CT HEAD WO CONTRAST  Result Date: 04/28/2020 CLINICAL DATA:  Mental status  change, dizziness last night, difficulty walking at church this morning, difficulty communicating, appears tired and fatigued. History EXAM: CT HEAD WITHOUT CONTRAST TECHNIQUE: Contiguous axial images were obtained from the base of the skull through the vertex without intravenous contrast. Sagittal and coronal MPR images reconstructed from axial data set. COMPARISON:  11/28/2019 FINDINGS: Brain: Normal ventricular morphology. No midline shift or mass effect. Normal appearance of brain parenchyma. No intracranial hemorrhage, mass lesion, evidence of acute infarction, or extra-axial fluid collection. Vascular: No  hyperdense vessels Skull: Intact Sinuses/Orbits: Clear Other: N/A IMPRESSION: Normal exam. Electronically Signed   By: Lavonia Dana M.D.   On: 04/28/2020 16:26   MR BRAIN WO CONTRAST  Result Date: 04/28/2020 CLINICAL DATA:  Transient ischemic attack EXAM: MRI HEAD WITHOUT CONTRAST TECHNIQUE: Multiplanar, multiecho pulse sequences of the brain and surrounding structures were obtained without intravenous contrast. COMPARISON:  None. FINDINGS: Brain: No acute infarct, mass effect or extra-axial collection. No acute or chronic hemorrhage. Normal white matter signal, parenchymal volume and CSF spaces. The midline structures are normal. Vascular: Major flow voids are preserved. Skull and upper cervical spine: Normal calvarium and skull base. Visualized upper cervical spine and soft tissues are normal. Sinuses/Orbits:No paranasal sinus fluid levels or advanced mucosal thickening. No mastoid or middle ear effusion. Normal orbits. IMPRESSION: Normal brain MRI. Electronically Signed   By: Ulyses Jarred M.D.   On: 04/28/2020 21:56        Scheduled Meds: . citalopram  10 mg Oral Daily  . loratadine  10 mg Oral Daily  . montelukast  10 mg Oral Daily  . multivitamin with minerals  2 tablet Oral Daily  . nortriptyline  25 mg Oral QHS  . [START ON 04/30/2020] pantoprazole  20 mg Oral Daily   Continuous  Infusions: . sodium chloride 100 mL/hr at 04/29/20 1040     LOS: 0 days    Time spent: 35 minutes    Nakayla Rorabaugh A Devanie Galanti, MD [facility]   If 7PM-7AM, please contact night-coverage [email or web address]  04/29/2020, 1:13 PM

## 2020-04-29 NOTE — Progress Notes (Signed)
At 1400, patient continues to complain of a frontal headache which is a 8/10 despite receiving 2 doses of tylenol 650mg  this morning. Dr. Curly Shores was informed and ordered Magnesium 2mg  IVPB. Order implemented. Will reassess pain.  Dr.Bhagati states she will review the patients allergies and see if anything else for pain management will be suitable as well.

## 2020-04-29 NOTE — Progress Notes (Signed)
Pt c/o of frontal headache 10/10. Pt said she has history of migraines and usually tylenol takes the pain away. Pt also did c/o of nausea. Medicated with tylenol, zofran and IV benadryl as ordered by MD. New IV on RUE did not show any signs of infiltrate & has + BR. IV zofran and IV benadryl administered without any issues. NSS at 100cc/hour restarted. Pt c/o of throbbing pain on IV site. IV rechecked, flushes well with + BR. NSS rate dropped to 62ml/hour per pts request. Pt denies pain. Pt said she could only tolerate slow infusion rate.

## 2020-04-29 NOTE — Progress Notes (Signed)
Pt seen, tolerating the IV infusion w/o pain & headache dropped to 6/10. Pt has used the [facility] at least 5x and is complaining of urinary urgency and pressure. Urine is clear and pt only voided 64ml. NP on call notified. Pt has a UA done on 3/27. Bladder scan is 0 ml. NP to order pyridium.

## 2020-04-29 NOTE — Evaluation (Signed)
Physical Therapy Evaluation Patient Details Name: Erin Richards MRN: [id number] DOB: [birth year] Today's Date: 04/29/2020   History of Present Illness  Pt admitted for TIA with complaints of diorientation and lethargy with difficulty walking and speaking. History includes caridac stent, pulm valve stenosis, depression, and anxiety. MRI negative for acute CVA at this time.  Clinical Impression  Pt is a pleasant 55 year old female who was admitted for TIA. Pt performs bed mobility/transfers with independence and ambulation with supervision with no AD. Slight dizziness noted with mod DGI, however no functional deficits noted. Pt demonstrates all bed mobility/transfers/ambulation at baseline level. Coordination/sensation intact. Good family support at discharge. Pt does not require any further PT needs at this time. Pt will be dc in house and does not require follow up. RN aware. Will dc current orders.     Follow Up Recommendations No PT follow up    Equipment Recommendations  None recommended by PT    Recommendations for Other Services       Precautions / Restrictions Precautions Precautions: Fall Restrictions Weight Bearing Restrictions: No      Mobility  Bed Mobility Overal bed mobility: Independent             General bed mobility comments: safe technique with upright posture noted    Transfers Overall transfer level: Independent Equipment used: None             General transfer comment: safe technique. No increased HA with position change  Ambulation/Gait Ambulation/Gait assistance: Supervision Gait Distance (Feet): 200 Feet Assistive device: None Gait Pattern/deviations: WFL(Within Functional Limits)     General Gait Details: slighty decreased speed noted this date with complaints of headache. Able to perform mod DGI with safe technique. No LOB noted, slight dizziness with head turns  Financial trader Rankin  (Stroke Patients Only)       Balance Overall balance assessment: Independent                               Standardized Balance Assessment Standardized Balance Assessment : Dynamic Gait Index   Dynamic Gait Index Level Surface: Normal Change in Gait Speed: Normal Gait with Horizontal Head Turns: Normal Gait with Vertical Head Turns: Normal       Pertinent Vitals/Pain Pain Assessment: Faces Faces Pain Scale: Hurts little more Pain Location: headache Pain Descriptors / Indicators: Aching Pain Intervention(s): Premedicated before session;Limited activity within patient's tolerance    Home Living Family/patient expects to be discharged to:: Private residence Living Arrangements: Alone Available Help at Discharge:  (is planning on moving in with fiance soon.) Type of Home: House Home Access: Level entry     Home Layout: One level   Additional Comments: has previous equipment from late husband including Wheelchair, Special educational needs teacher. Doesn't use at baseline.    Prior Function Level of Independence: Independent         Comments: enjoys going dancing with fiance. Reports no falls. indep with all ADLs.     Hand Dominance        Extremity/Trunk Assessment   Upper Extremity Assessment Upper Extremity Assessment: Overall WFL for tasks assessed    Lower Extremity Assessment Lower Extremity Assessment: Overall WFL for tasks assessed       Communication   Communication: No difficulties  Cognition Arousal/Alertness: Awake/alert Behavior During Therapy: WFL for tasks assessed/performed Overall  Cognitive Status: Within Functional Limits for tasks assessed                                        General Comments      Exercises     Assessment/Plan    PT Assessment Patent does not need any further PT services  PT Problem List         PT Treatment Interventions      PT Goals (Current goals can be found in the Care Plan section)   Acute Rehab PT Goals Patient Stated Goal: to go home PT Goal Formulation: All assessment and education complete, DC therapy Time For Goal Achievement: 04/29/20 Potential to Achieve Goals: Good    Frequency     Barriers to discharge        Co-evaluation               AM-PAC PT "6 Clicks" Mobility  Outcome Measure Help needed turning from your back to your side while in a flat bed without using bedrails?: None Help needed moving from lying on your back to sitting on the side of a flat bed without using bedrails?: None Help needed moving to and from a bed to a chair (including a wheelchair)?: None Help needed standing up from a chair using your arms (e.g., wheelchair or bedside chair)?: None Help needed to walk in hospital room?: None Help needed climbing 3-5 steps with a railing? : None 6 Click Score: 24    End of Session Equipment Utilized During Treatment: Gait belt Activity Tolerance: Patient tolerated treatment well Patient left: in chair;with family/visitor present Nurse Communication: Mobility status PT Visit Diagnosis: Muscle weakness (generalized) (M62.81)    Time: 2774-1287 PT Time Calculation (min) (ACUTE ONLY): 20 min   Charges:   PT Evaluation $PT Eval Low Complexity: 1 Low PT Treatments $Gait Training: 8-22 mins        Greggory Stallion, PT, DPT [phone]   Prudy Candy 04/29/2020, 10:46 AM
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
## 2020-04-29 NOTE — Evaluation (Signed)
Occupational Therapy Evaluation Patient Details Name: Erin Richards MRN: [id number] DOB: [birth year] Today's Date: 04/29/2020    History of Present Illness Pt admitted for TIA with complaints of diorientation and lethargy with difficulty walking and speaking. History includes caridac stent, pulm valve stenosis, depression, and anxiety. MRI negative for acute CVA at this time.   Clinical Impression   Pt seen for OT evaluation this date. Prior to hospital admission, pt was independent in all aspects of ADL/IADL. Pt was working at [employer] and enjoying dancing with her fiance prior to admission. Pt lives alone, however is planning to move in with fiance. Pt currently reporting symptoms have resolved. Pt reports baseline independence to perform ADL and mobility tasks and no strength, sensory, coordination, cognitive, or visual deficits appreciated with assessment. Pt educated on role of OT and POC, with pt and family asking about the impact of repetitive motions in the workplace on sensation; OT provided education and encourage pt to track workplace injuries and report to PCP. No additional skilled OT needs identified at this time. Will sign off. Please re-consult if additional OT needs arise.    Follow Up Recommendations  No OT follow up    Equipment Recommendations  None recommended by OT       Precautions / Restrictions Precautions Precautions: Fall Restrictions Weight Bearing Restrictions: No      Mobility Bed Mobility              General bed mobility comments: Not assessed; pt in chair upon arrival           ADL either performed or assessed with clinical judgement   ADL Overall ADL's : Independent                                       General ADL Comments: Pt reports no difficulties completing ADLs during admission     Vision Patient Visual Report: No change from baseline              Pertinent Vitals/Pain Pain Assessment: Faces Faces  Pain Scale: Hurts little more Pain Location: headache Pain Descriptors / Indicators: Aching Pain Intervention(s): Limited activity within patient's tolerance;Monitored during session        Extremity/Trunk Assessment Upper Extremity Assessment Upper Extremity Assessment: Overall WFL for tasks assessed   Lower Extremity Assessment Lower Extremity Assessment: Overall WFL for tasks assessed       Communication Communication Communication: No difficulties   Cognition Arousal/Alertness: Awake/alert Behavior During Therapy: WFL for tasks assessed/performed Overall Cognitive Status: Within Functional Limits for tasks assessed                                        Exercises Exercises: Other exercises Other Exercises Other Exercises: Educated on role of OT, POC, and workplace injuries related to repetitive motions        Home Living Family/patient expects to be discharged to:: Private residence Living Arrangements: Alone Available Help at Discharge: Family (is planning on moving in with fiance soon.) Type of Home: House Home Access: Level entry     Home Layout: One level                   Additional Comments: has previous equipment from late husband including Wheelchair, Special educational needs teacher. Doesn't use at baseline.  Prior Functioning/Environment Level of Independence: Independent        Comments: enjoys going dancing with fiance. Reports no falls. indep with all ADLs. Works at [employer]        OT Problem List: Impaired sensation      OT Treatment/Interventions:      OT Goals(Current goals can be found in the care plan section) Acute Rehab OT Goals Patient Stated Goal: to go home OT Goal Formulation: With patient/family Time For Goal Achievement: 05/13/20 Potential to Achieve Goals: Good   AM-PAC OT "6 Clicks" Daily Activity     Outcome Measure Help from another person eating meals?: None Help from another person taking care of  personal grooming?: None Help from another person toileting, which includes using toliet, bedpan, or urinal?: None Help from another person bathing (including washing, rinsing, drying)?: None Help from another person to put on and taking off regular upper body clothing?: None Help from another person to put on and taking off regular lower body clothing?: None 6 Click Score: 24   End of Session Nurse Communication: Mobility status  Activity Tolerance: Patient tolerated treatment well Patient left: in chair;with call bell/phone within reach;with family/visitor present;with chair alarm set  OT Visit Diagnosis: Muscle weakness (generalized) (M62.81)                Time: 2258-3462 OT Time Calculation (min): 8 min Charges:  OT General Charges $OT Visit: 1 Visit OT Evaluation $OT Eval Low Complexity: [place] D [place], OTR/L [place]

## 2020-04-29 NOTE — Progress Notes (Addendum)
Received pt from ED, accompanied by SO. Patient is aox3, disoriented to event/situation. Pt has no recollection of what happened during the day. Pt was able to answer questions appropriately. C/o of generalized weakness. NIHSS performed. Orientation provided, call bell placed within reach and bed alarm activated. Pt for MRI.  At 2200, pt returned from MRI. VSS. No change from previous assessment. Assisted to [facility]. Box lunch provided. Pt able to swallow and meal tolerated.

## 2020-04-30 ENCOUNTER — Inpatient Hospital Stay
Admit: 2020-04-30 | Discharge: 2020-04-30 | Disposition: A | Discharge status: Home / Self Care | Payer: Medicaid Other | Attending: Internal Medicine | Admitting: Internal Medicine

## 2020-04-30 LAB — CBC
HCT: 34.7 % — ABNORMAL LOW (ref 36.0–46.0)
Hemoglobin: 11.9 g/dL — ABNORMAL LOW (ref 12.0–15.0)
MCH: 27.7 pg (ref 26.0–34.0)
MCHC: 34.3 g/dL (ref 30.0–36.0)
MCV: 80.9 fL (ref 80.0–100.0)
Platelets: 143 10*3/uL — ABNORMAL LOW (ref 150–400)
RBC: 4.29 MIL/uL (ref 3.87–5.11)
RDW: 13.8 % (ref 11.5–15.5)
WBC: 3 10*3/uL — ABNORMAL LOW (ref 4.0–10.5)
nRBC: 0 % (ref 0.0–0.2)

## 2020-04-30 LAB — ECHOCARDIOGRAM COMPLETE
AR max vel: 3.01 cm2
AV Area VTI: 4.3 cm2
AV Area mean vel: 3.48 cm2
AV Mean grad: 5 mmHg
AV Peak grad: 8.1 mmHg
Ao pk vel: 1.42 m/s
Area-P 1/2: 4.74 cm2
Height: 57 in
S' Lateral: 2.05 cm
Weight: 2240.01 oz

## 2020-04-30 LAB — RENAL FUNCTION PANEL
Albumin: 3.6 g/dL (ref 3.5–5.0)
Anion gap: 4 — ABNORMAL LOW (ref 5–15)
BUN: 13 mg/dL (ref 6–20)
CO2: 27 mmol/L (ref 22–32)
Calcium: 8.9 mg/dL (ref 8.9–10.3)
Chloride: 109 mmol/L (ref 98–111)
Creatinine, Ser: 0.71 mg/dL (ref 0.44–1.00)
GFR, Estimated: >60 mL/min (ref >60)
Glucose, Bld: 99 mg/dL (ref 70–99)
Phosphorus: 3.5 mg/dL (ref 2.5–4.6)
Potassium: 4 mmol/L (ref 3.5–5.1)
Sodium: 140 mmol/L (ref 135–145)

## 2020-04-30 LAB — GLUCOSE, CAPILLARY
Glucose-Capillary: 103 mg/dL — ABNORMAL HIGH (ref 70–99)
Glucose-Capillary: 103 mg/dL — ABNORMAL HIGH (ref 70–99)
Glucose-Capillary: 113 mg/dL — ABNORMAL HIGH (ref 70–99)
Glucose-Capillary: 119 mg/dL — ABNORMAL HIGH (ref 70–99)
Glucose-Capillary: 86 mg/dL (ref 70–99)
Glucose-Capillary: 92 mg/dL (ref 70–99)
Glucose-Capillary: 93 mg/dL (ref 70–99)

## 2020-04-30 MED ORDER — VITAMIN B-12 1000 MCG PO TABS
1000.0000 ug | ORAL_TABLET | Freq: Every day | ORAL | Status: DC
  Administered 2020-05-01 – 2020-05-02 (×2): 1000 ug via ORAL
  Filled 2020-04-30 (×2): qty 1

## 2020-04-30 MED ORDER — ZOLPIDEM TARTRATE 5 MG PO TABS
2.5000 mg | ORAL_TABLET | Freq: Every evening | ORAL | Status: DC | PRN
  Administered 2020-04-30 – 2020-05-01 (×2): 2.5 mg via ORAL
  Filled 2020-04-30 (×2): qty 1

## 2020-04-30 MED ORDER — ZINC OXIDE 40 % EX OINT
TOPICAL_OINTMENT | Freq: Two times a day (BID) | CUTANEOUS | Status: DC | PRN
  Filled 2020-04-30: qty 113

## 2020-04-30 MED ORDER — PHENAZOPYRIDINE HCL 200 MG PO TABS
200.0000 mg | ORAL_TABLET | Freq: Three times a day (TID) | ORAL | Status: DC | PRN
Start: 2020-04-30 — End: 2020-05-02
  Filled 2020-04-30 (×3): qty 1

## 2020-04-30 MED ORDER — COSYNTROPIN 0.25 MG IJ SOLR
0.2500 mg | Freq: Once | INTRAMUSCULAR | Status: AC
  Administered 2020-05-01: 06:00:00 0.25 mg via INTRAVENOUS
  Filled 2020-04-30: qty 0.25

## 2020-04-30 NOTE — Progress Notes (Signed)
Nutrition Brief Note  RD consulted for diet education and assessment of nutritional requirements/ status due to suspected hypoglycemia PTA.   Wt Readings from Last 15 Encounters:  04/28/20 63.5 kg  04/28/20 63.5 kg  03/25/20 66.4 kg  03/02/20 61.7 kg  02/24/20 61.7 kg  12/21/19 62.4 kg  12/08/19 61.2 kg  11/28/19 61.2 kg  11/16/19 62.7 kg  11/14/19 62.1 kg  10/11/19 63 kg  10/03/19 62.1 kg  08/22/19 64 kg  07/26/19 62.6 kg  07/10/19 64 kg   Erin Richards is a 55 y.o. 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 difficile colitis, recurrent symptoms, presents to the emergency department for chief concerns of confusion and numbness.  Pt admitted with confusion with numbess.   Reviewed I/O's: +1.3 L x 24 hours and +1.4 L since admision  UOP: 506 ml x 24 hours  Pt unavailable at time of visit. Attempted to speak with pt via call to hospital room, however, unable to reach.   Reviewed wt hx; pt wt has been stable over the past 5 months.  RD provided "Hypoglycemia (Not Caused By Diabetes) Nutrition Therapy" handout from [facility] Nutrition Care Manual. Attached to AVS/ discharge instructions.   Medications reviewed and include vitamin B-12.   Labs reviewed: CBGS: 86-119.   Body mass index is 30.3 kg/m. Patient meets criteria for obesity, class I based on current BMI. Obesity is a complex, chronic medical condition that is optimally managed by a multidisciplinary care team. Weight loss is not an ideal goal for an acute inpatient hospitalization. However, if further work-up for obesity is warranted, consider outpatient referral to outpatient bariatric service and/or [facility] [facility] Nutrition and Diabetes Education Services.   Current diet order is regular, patient is consuming approximately 50-100% of meals at this time. Labs and medications  reviewed.   No nutrition interventions warranted at this time. If nutrition issues arise, please consult RD.   Loistine Chance, RD, LDN, [place] Registered Dietitian II Certified Diabetes Care and Education Specialist Please refer to [facility] for RD and/or RD on-call/weekend/after hours pager

## 2020-04-30 NOTE — Plan of Care (Signed)
  Problem: Education: Goal: Knowledge of General Education information will improve Description: Including pain rating scale, medication(s)/side effects and non-pharmacologic comfort measures Outcome: Progressing   Problem: Health Behavior/Discharge Planning: Goal: Ability to manage health-related needs will improve Outcome: Progressing   Problem: Clinical Measurements: Goal: Ability to maintain clinical measurements within normal limits will improve Outcome: Progressing Goal: Will remain free from infection Outcome: Progressing Goal: Diagnostic test results will improve Outcome: Progressing Goal: Respiratory complications will improve Outcome: Progressing Goal: Cardiovascular complication will be avoided Outcome: Progressing   Problem: Activity: Goal: Risk for activity intolerance will decrease Outcome: Progressing   Problem: Nutrition: Goal: Adequate nutrition will be maintained Outcome: Progressing   Problem: Coping: Goal: Level of anxiety will decrease Outcome: Progressing   Problem: Elimination: Goal: Will not experience complications related to bowel motility Outcome: Progressing Goal: Will not experience complications related to urinary retention Outcome: Progressing   Problem: Pain Managment: Goal: General experience of comfort will improve Outcome: Progressing   Problem: Safety: Goal: Ability to remain free from injury will improve Outcome: Progressing   Problem: Skin Integrity: Goal: Risk for impaired skin integrity will decrease Outcome: Progressing   Problem: Education: Goal: Knowledge of disease or condition will improve Outcome: Progressing Goal: Knowledge of secondary prevention will improve Outcome: Progressing Goal: Knowledge of patient specific risk factors addressed and post discharge goals established will improve Outcome: Progressing Goal: Individualized Educational Video(s) Outcome: Progressing   Problem: Coping: Goal: Will verbalize  positive feelings about self Outcome: Progressing Goal: Will identify appropriate support needs Outcome: Progressing   Problem: Self-Care: Goal: Verbalization of feelings and concerns over difficulty with self-care will improve Outcome: Progressing   Problem: Nutrition: Goal: Dietary intake will improve Outcome: Progressing

## 2020-04-30 NOTE — Progress Notes (Signed)
*  PRELIMINARY RESULTS* Echocardiogram 2D Echocardiogram has been performed.  Sherrie Sport 04/30/2020, 1:42 PM
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
## 2020-04-30 NOTE — Progress Notes (Addendum)
PROGRESS NOTE    Erin Richards  GGE:[id number] DOB: [birth year] DOA: 04/28/2020 PCP: Birdie Sons, MD   Brief Narrative: 55 year old with past medical history significant for atrial septal defect, multiple intracardiac stent, nonrheumatic pulmonary valve stenosis, bronchitis, depression, anxiety C. difficile colitis present after episode of confusion and possible syncope event.  Her significant other who helped provide history, they went to church, when they were walking into the store she was not feeling herself, she was feeling weak, she was leaning to the right.  He gave her drink and fruit snack in case that it was low blood sugar.  She felt a little bit better after she ate some.  After they leave church while driving in the car, she slumped her head down, was not very responsive.  She got very lethargic.  After she wake up at the urgent care or ED she was very confused she did not know where she was at.  She did not know what was her birthday.  Patient does not have any recollection of the events.  She has not been taking tramadol.  She has been taking Ambien for a long period of time. Her blood pressure was noted to be low in the ED at 70. She has had numbness of her fingertips for more than 3 weeks. She is complaining of Headaches today, frontal area.  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 -She has not been taking tramadol.  Has been taking Ambien for a  long period of time. -ECHO pending.  Orthostatic negative -Continue  to monitor on telemetry. -Neurology has been consulted due to episode of confusion and headaches. Neurology think patient might had hypoglycemia and hypotension explaining her symptoms.  -Received IV fluids.  -hemoglobin A1c 4.1 -For episodes of possible/Presume ? Hypoglycemia/Hypotension: Monitor  CBG, check sulfonylurea,  cortisol level at 5. CBG has not been low in the hospital.  TSH: 1.4, Plan to check ACTH stimulation test.  -Patient will need glucose meter at discharge.   2-Numbness: Could be related to neuropathy.  B12 low normal range 316.  Started on B 12 1000 mcg daily. Needs B12 recheck in 4 weeks.   3-Depression anxiety: Resume citalopram. Resume gabapentin if is okay with neurology  4-GERD: Continue with PPI Seasonal allergies: Continue with montelukast, Claritin. Leukopenia; repeat labs tomorrow.  Headaches; Benadryl PRN.   Estimated body mass index is 30.3 kg/m as calculated from the following:   Height as of this encounter: 4\' 9"  (1.448 m).   Weight as of this encounter: 63.5 kg.   DVT prophylaxis: SCDs Code Status: Full code Family Communication: Significant other at bedside Disposition Plan:  Status is: Observation  The patient remains OBS appropriate and will d/c before 2 midnights.  Dispo: The patient is from: Home              Anticipated d/c is to: Home  Patient currently is not medically stable to d/c.   Difficult to place patient No        Consultants:   Neurology  Procedures:   Echo  Antimicrobials:    Subjective: She is feeling better, report mild irritation private area, burning when she urinate.  She denies headaches today    Objective: Vitals:   04/30/20 0413 04/30/20 0746 04/30/20 0815 04/30/20 1127  BP: 99/60 (!) 100/50  (!) 105/45  Pulse: 67 69 84 78  Resp: 14 17 20 17   Temp: 98.2 F (36.8 C) 97.7 F (36.5 C)  98.2 F (36.8 C)   TempSrc: Oral Oral  Oral  SpO2: 99% 91% 97% 97%  Weight:      Height:        Intake/Output Summary (Last 24 hours) at 04/30/2020 1416 Last data filed at 04/30/2020 1414 Gross per 24 hour  Intake 1697.72 ml  Output 1106 ml  Net 591.72 ml   Filed Weights   04/28/20 1422  Weight: 63.5 kg    Examination:  General exam: NAD Respiratory system: CTA Cardiovascular system: S 1, S 2 RRR Gastrointestinal system: BS present, soft, nt Central nervous system: alert and oriented  Extremities: Symmetric power    Data Reviewed: I have personally reviewed following labs and imaging studies  CBC: Recent Labs  Lab 04/28/20 1424 04/30/20 0723  WBC 4.8 3.0*  NEUTROABS 2.5  --   HGB 12.3 11.9*  HCT 38.3 34.7*  MCV 85.7 80.9  PLT 155 161*   Basic Metabolic Panel: Recent Labs  Lab 04/28/20 1424 04/30/20 0723  NA 141 140  K 3.5 4.0  CL 111 109  CO2 26 27  GLUCOSE 76 99  BUN 14 13  CREATININE 0.54 0.71  CALCIUM 8.6* 8.9  PHOS  --  3.5   GFR: Estimated Creatinine Clearance: 63.1 mL/min (by C-G formula based on SCr of 0.71 mg/dL). Liver Function Tests: Recent Labs  Lab 04/28/20 1424 04/30/20 0723  AST 22  --   ALT 10  --   ALKPHOS 52  --   BILITOT 1.0  --   PROT 6.6  --   ALBUMIN 3.9 3.6   No results for input(s): LIPASE, AMYLASE in the last 168 hours. No results for input(s): AMMONIA in the last 168 hours. Coagulation Profile: Recent Labs  Lab 04/28/20 1424  INR 1.0   Cardiac Enzymes: No results for input(s): CKTOTAL, CKMB, CKMBINDEX, TROPONINI in the last 168 hours. BNP (last 3 results) No results for input(s): PROBNP in the last 8760 hours. HbA1C: Recent Labs    04/29/20 0341  HGBA1C 4.1*   CBG: Recent Labs  Lab 04/29/20 1954 04/30/20 0103 04/30/20 0417 04/30/20 0748 04/30/20 1127  GLUCAP 119* 103* 92 86 119*   Lipid Profile: Recent Labs    04/29/20 0341  CHOL 148  HDL 43  LDLCALC 89  TRIG 78  CHOLHDL 3.4   Thyroid Function  Tests: Recent Labs    04/29/20 1357  TSH 1.435   Anemia Panel: Recent Labs    04/29/20 0340  VITAMINB12 316   Sepsis Labs: No results for input(s): PROCALCITON, LATICACIDVEN in the last 168 hours.  Recent Results (from the past 240 hour(s))  Resp Panel by RT-PCR (Flu A&B, Covid) Nasopharyngeal Swab     Status: None   Collection Time: 04/28/20  6:40 PM   Specimen: Nasopharyngeal Swab; Nasopharyngeal(NP) swabs in vial transport medium  Result Value Ref Range Status   SARS Coronavirus 2 by RT PCR  NEGATIVE NEGATIVE Final    Comment: (NOTE) SARS-CoV-2 target nucleic acids are NOT DETECTED.  The SARS-CoV-2 RNA is generally detectable in upper respiratory specimens during the acute phase of infection. The lowest concentration of SARS-CoV-2 viral copies this assay can detect is 138 copies/mL. A negative result does not preclude SARS-Cov-2 infection and should not be used as the sole basis for treatment or other patient management decisions. A negative result may occur with  improper specimen collection/handling, submission of specimen other than nasopharyngeal swab, presence of viral mutation(s) within the areas targeted by this assay, and inadequate number of viral copies(<138 copies/mL). A negative result must be combined with clinical observations, patient history, and epidemiological information. The expected result is Negative.  Fact Sheet for Patients:  EntrepreneurPulse.com.au  Fact Sheet for Healthcare Providers:  IncredibleEmployment.be  This test is no t yet approved or cleared by the [place] FDA and  has been authorized for detection and/or diagnosis of SARS-CoV-2 by FDA under an Emergency Use Authorization (EUA). This EUA will remain  in effect (meaning this test can be used) for the duration of the COVID-19 declaration under Section 564(b)(1) of the Act, 21 U.S.C.section 360bbb-3(b)(1), unless the authorization is  terminated  or revoked sooner.       Influenza A by PCR NEGATIVE NEGATIVE Final   Influenza B by PCR NEGATIVE NEGATIVE Final    Comment: (NOTE) The Xpert Xpress SARS-CoV-2/FLU/RSV plus assay is intended as an aid in the diagnosis of influenza from Nasopharyngeal swab specimens and should not be used as a sole basis for treatment. Nasal washings and aspirates are unacceptable for Xpert Xpress SARS-CoV-2/FLU/RSV testing.  Fact Sheet for Patients: EntrepreneurPulse.com.au  Fact Sheet for Healthcare Providers: IncredibleEmployment.be  This test is not yet approved or cleared by the [place] FDA and has been authorized for detection and/or diagnosis of SARS-CoV-2 by FDA under an Emergency Use Authorization (EUA). This EUA will remain in effect (meaning this test can be used) for the duration of the COVID-19 declaration under Section 564(b)(1) of the Act, 21 U.S.C. section 360bbb-3(b)(1), unless the authorization is terminated or revoked.  Performed at [facility], [address] [place], [place] [address]          Radiology Studies: CT HEAD WO CONTRAST  Result Date: 04/28/2020 CLINICAL DATA:  Mental status change, dizziness last night, difficulty walking at church this morning, difficulty communicating, appears tired and fatigued. History EXAM: CT HEAD WITHOUT CONTRAST TECHNIQUE: Contiguous axial images were obtained from the base of the skull through the vertex without intravenous contrast. Sagittal and coronal MPR images reconstructed from axial data set. COMPARISON:  11/28/2019 FINDINGS: Brain: Normal ventricular morphology. No midline shift or mass effect. Normal appearance of brain parenchyma. No intracranial hemorrhage, mass lesion, evidence of acute infarction, or extra-axial fluid collection. Vascular: No hyperdense vessels Skull: Intact Sinuses/Orbits: Clear Other: N/A IMPRESSION: Normal exam. Electronically Signed   By:  Lavonia Dana M.D.   On: 04/28/2020 16:26   MR BRAIN WO CONTRAST  Result Date: 04/28/2020 CLINICAL DATA:  Transient ischemic attack EXAM: MRI HEAD WITHOUT CONTRAST TECHNIQUE: Multiplanar, multiecho pulse sequences of the brain and surrounding structures were obtained without intravenous contrast. COMPARISON:  None. FINDINGS: Brain: No acute infarct, mass effect or extra-axial collection. No acute or chronic hemorrhage. Normal white matter signal, parenchymal volume and CSF spaces. The midline structures are normal. Vascular: Major flow voids are preserved. Skull and upper cervical spine: Normal calvarium and skull base. Visualized upper cervical spine and soft  tissues are normal. Sinuses/Orbits:No paranasal sinus fluid levels or advanced mucosal thickening. No mastoid or middle ear effusion. Normal orbits. IMPRESSION: Normal brain MRI. Electronically Signed   By: Ulyses Jarred M.D.   On: 04/28/2020 21:56   ECHOCARDIOGRAM COMPLETE  Result Date: 04/30/2020    ECHOCARDIOGRAM REPORT   Patient Name:   LASHEENA FRIEZE Date of Exam: 04/30/2020 Medical Rec #:  [id number]      Height:       57.0 in Accession #:    [id number]     Weight:       140.0 lb Date of Birth:  Apr 06, 1965      BSA:          1.546 m Patient Age:    38 years       BP:           105/45 mmHg Patient Gender: F              HR:           78 bpm. Exam Location:  [facility] Procedure: 2D Echo, Cardiac Doppler and Color Doppler Indications:     TIA G45.9  History:         Patient has prior history of Echocardiogram examinations, most                  recent 10/10/2014. Signs/Symptoms:Murmur. ASD repair.  Sonographer:     Sherrie Sport RDCS (AE) Referring Phys:  [id number] AMY N COX Diagnosing Phys: Serafina Royals MD  Sonographer Comments: Suboptimal apical window. IMPRESSIONS  1. Left ventricular ejection fraction, by estimation, is 60 to 65%. The left ventricle has normal function. The left ventricle has no regional wall motion abnormalities. Left ventricular diastolic  parameters were normal.  2. Right ventricular systolic function is normal. The right ventricular size is normal.  3. The mitral valve is normal in structure. Trivial mitral valve regurgitation.  4. The aortic valve is normal in structure. Aortic valve regurgitation is trivial. FINDINGS  Left Ventricle: Left ventricular ejection fraction, by estimation, is 60 to 65%. The left ventricle has normal function. The left ventricle has no regional wall motion abnormalities. The left ventricular internal cavity size was small. There is no left ventricular hypertrophy. Left ventricular diastolic parameters were normal. Right Ventricle: The right ventricular size is normal. No increase in right ventricular wall thickness. Right ventricular systolic function is normal. Left Atrium: Left atrial size was normal in size. Right Atrium: Right atrial size was normal in size. Pericardium: There is no evidence of pericardial effusion. Mitral Valve: The mitral valve is normal in structure. Trivial mitral valve regurgitation. Tricuspid Valve: The tricuspid valve is normal in structure. Tricuspid valve regurgitation is trivial. Aortic Valve: The aortic valve is normal in structure. Aortic valve regurgitation is trivial. Aortic valve mean gradient measures 5.0 mmHg. Aortic valve peak gradient measures 8.1 mmHg. Aortic valve area, by VTI measures 4.30 cm. Pulmonic Valve: The pulmonic valve was normal in structure. Pulmonic valve regurgitation is not visualized. Aorta: The aortic root and ascending aorta are structurally normal, with no evidence of dilitation. IAS/Shunts: No atrial level shunt detected by color flow Doppler.  LEFT VENTRICLE PLAX 2D LVIDd:         3.30 cm  Diastology LVIDs:         2.05 cm  LV e' medial:    4.35 cm/s LV PW:         1.22 cm  LV E/e' medial:  16.2 LV IVS:  0.84 cm  LV e' lateral:   7.07 cm/s LVOT diam:     2.00 cm  LV E/e' lateral: 9.9 LV SV:         85 LV SV Index:   55 LVOT Area:     3.14 cm  RIGHT  VENTRICLE RV Basal diam:  2.48 cm RV S prime:     13.10 cm/s TAPSE (M-mode): 3.3 cm LEFT ATRIUM             Index       RIGHT ATRIUM           Index LA diam:        3.40 cm 2.20 cm/m  RA Area:     15.80 cm LA Vol (A2C):   58.9 ml 38.11 ml/m RA Volume:   41.50 ml  26.85 ml/m LA Vol (A4C):   40.5 ml 26.20 ml/m LA Biplane Vol: 49.4 ml 31.96 ml/m  AORTIC VALVE                    PULMONIC VALVE AV Area (Vmax):    3.01 cm     PV Vmax:        0.94 m/s AV Area (Vmean):   3.48 cm     PV Peak grad:   3.5 mmHg AV Area (VTI):     4.30 cm     RVOT Peak grad: 4 mmHg AV Vmax:           142.00 cm/s AV Vmean:          97.500 cm/s AV VTI:            0.198 m AV Peak Grad:      8.1 mmHg AV Mean Grad:      5.0 mmHg LVOT Vmax:         136.00 cm/s LVOT Vmean:        108.000 cm/s LVOT VTI:          0.271 m LVOT/AV VTI ratio: 1.37  AORTA Ao Root diam: 2.62 cm MITRAL VALVE                TRICUSPID VALVE MV Area (PHT): 4.74 cm     TR Peak grad:   28.9 mmHg MV Decel Time: 160 msec     TR Vmax:        269.00 cm/s MV E velocity: 70.30 cm/s MV A velocity: 118.00 cm/s  SHUNTS MV E/A ratio:  0.60         Systemic VTI:  0.27 m                             Systemic Diam: 2.00 cm Serafina Royals MD Electronically signed by Serafina Royals MD Signature Date/Time: 04/30/2020/2:15:30 PM    Final         Scheduled Meds: . citalopram  10 mg Oral Daily  . [START ON 05/01/2020] cosyntropin  0.25 mg Intravenous Once  . loratadine  10 mg Oral Daily  . montelukast  10 mg Oral Daily  . multivitamin with minerals  2 tablet Oral Daily  . nortriptyline  25 mg Oral QHS  . pantoprazole  20 mg Oral Daily  . [START ON 05/01/2020] vitamin B-12  1,000 mcg Oral Daily   Continuous Infusions:    LOS: 1 day    Time spent: 35 minutes    Robynn Marcel A Itzae Miralles, MD [facility]   If 7PM-7AM, please  contact night-coverage [email or web address]  04/30/2020, 2:16 PM

## 2020-05-01 DIAGNOSIS — R55 Syncope and collapse: Secondary | ICD-10-CM

## 2020-05-01 LAB — ACTH STIMULATION, 3 TIME POINTS
Cortisol, 30 Min: 18.5 ug/dL
Cortisol, 60 Min: 22.8 ug/dL
Cortisol, Base: 6.9 ug/dL

## 2020-05-01 LAB — GLUCOSE, CAPILLARY
Glucose-Capillary: 91 mg/dL (ref 70–99)
Glucose-Capillary: 90 mg/dL (ref 70–99)

## 2020-05-01 NOTE — Progress Notes (Signed)
[facility]  PROGRESS NOTE  Erin Richards YKD:[id number] DOB: 1966-01-29 DOA: 04/28/2020 PCP: Birdie Sons, MD   Brief HPI:    55 year old female with history of atrial septal defect, TIA, DVT, tricuspid regurgitation who presented with confusion and possible syncopal event.  As per patient, she was with significant other at discharge and was not feeling herself.  She felt weak and was leaning to the right.  He gave up drinking fruit snack after that she felt any better.  After the left discharge and was driving in the car she slammed her head down and was unresponsive.  She was lethargic.  EMS was called.  Her systolic blood pressure in the ED was at 70.   Subjective   Patient seen and examined, denies further episodes of syncope.  Denies chest pain or shortness of breath.  Cosyntropin stimulation test performed this morning.   Assessment/Plan:     1. Syncope-MRI was negative for stroke.  Echocardiogram has been obtained and is currently pending.  Telemetry monitoring shows no significant abnormality.  Neurology saw the patient and felt that it was from hypoglycemia and hypotension which were explaining her symptoms.  Continue to monitor on telemetry.  Hemoglobin A1c is 4.1.  Random cortisol was 5, cosyntropin stimulation test showed adequate response of serum cortisol at 30 minutes-18 mcg/dL and 60 minutes-22 mcg/dL.  Orthostatic vital signs were negative for postural hypotension. 2. Hypoglycemia/hypotension-patient hemoglobin A1c is 4.1, will check serum insulin, C-peptide.  3. Neuropathy-patient B12 was in normal range 316.  Started on B12 1000 mcg daily.  Will need to recheck B12 level in 4 weeks. 4. Depression/anxiety-continue citalopram. 5. GERD-continue PPI 6. Seasonal allergies-continue with montelukast, Claritin.   Scheduled medications:   . citalopram  10 mg Oral Daily  . loratadine  10 mg Oral Daily  . montelukast  10 mg Oral Daily  . multivitamin with minerals   2 tablet Oral Daily  . nortriptyline  25 mg Oral QHS  . pantoprazole  20 mg Oral Daily  . vitamin B-12  1,000 mcg Oral Daily         Data Reviewed:   CBG:  Recent Labs  Lab 04/30/20 1609 04/30/20 2009 04/30/20 2336 05/01/20 0331 05/01/20 0748  GLUCAP 93 113* 103* 91 90    SpO2: 97 %    Vitals:   05/01/20 0335 05/01/20 0748 05/01/20 1154 05/01/20 1545  BP: 98/66 (!) 112/59 (!) 102/56 (!) 115/59  Pulse: 78 70 80 83  Resp: 20 18  18   Temp: 97.9 F (36.6 C) 98.3 F (36.8 C) 98.2 F (36.8 C) 98.3 F (36.8 C)  TempSrc: Oral Oral Oral   SpO2: 100% 100% 100% 97%  Weight:      Height:         Intake/Output Summary (Last 24 hours) at 05/01/2020 1609 Last data filed at 05/01/2020 1358 Gross per 24 hour  Intake 840 ml  Output 1850 ml  Net -1010 ml    03/28 1901 - 03/30 0700 In: 1634.2 [P.O.:720; I.V.:914.2] Out: 3005 [Urine:3005]  Filed Weights   04/28/20 1422  Weight: 63.5 kg    CBC:  Recent Labs  Lab 04/28/20 1424 04/30/20 0723  WBC 4.8 3.0*  HGB 12.3 11.9*  HCT 38.3 34.7*  PLT 155 143*  MCV 85.7 80.9  MCH 27.5 27.7  MCHC 32.1 34.3  RDW 13.9 13.8  LYMPHSABS 1.8  --   MONOABS 0.4  --   EOSABS 0.1  --   BASOSABS 0.0  --  Complete metabolic panel:  Recent Labs  Lab 04/28/20 1424 04/29/20 0341 04/29/20 1357 04/30/20 0723  NA 141  --   --  140  K 3.5  --   --  4.0  CL 111  --   --  109  CO2 26  --   --  27  GLUCOSE 76  --   --  99  BUN 14  --   --  13  CREATININE 0.54  --   --  0.71  CALCIUM 8.6*  --   --  8.9  AST 22  --   --   --   ALT 10  --   --   --   ALKPHOS 52  --   --   --   BILITOT 1.0  --   --   --   ALBUMIN 3.9  --   --  3.6  INR 1.0  --   --   --   TSH  --   --  1.435  --   HGBA1C  --  4.1*  --   --   BNP 77.2  --   --   --     No results for input(s): LIPASE, AMYLASE in the last 168 hours.  Recent Labs  Lab 04/28/20 1424 04/28/20 1840  BNP 77.2  --   SARSCOV2NAA  --  NEGATIVE     ------------------------------------------------------------------------------------------------------------------ Recent Labs    04/29/20 0341  CHOL 148  HDL 43  LDLCALC 89  TRIG 78  CHOLHDL 3.4    Lab Results  Component Value Date   HGBA1C 4.1 (L) 04/29/2020   ------------------------------------------------------------------------------------------------------------------ Recent Labs    04/29/20 1357  TSH 1.435   ------------------------------------------------------------------------------------------------------------------ Recent Labs    04/29/20 0340  VITAMINB12 316    Coagulation profile  Recent Labs  Lab 04/28/20 1424  INR 1.0    ------------------------------------------------------------------------------------------------------------------    Component Value Date/Time   BNP 77.2 04/28/2020 1424     Antibiotics: Anti-infectives (From admission, onward)   None       Radiology Reports  ECHOCARDIOGRAM COMPLETE  Result Date: 04/30/2020    ECHOCARDIOGRAM REPORT   Patient Name:   JAYAH BALTHAZAR Date of Exam: 04/30/2020 Medical Rec #:  [id number]      Height:       57.0 in Accession #:    [id number]     Weight:       140.0 lb Date of Birth:  1965-08-18      BSA:          1.546 m Patient Age:    37 years       BP:           105/45 mmHg Patient Gender: F              HR:           78 bpm. Exam Location:  [facility] Procedure: 2D Echo, Cardiac Doppler and Color Doppler Indications:     TIA G45.9  History:         Patient has prior history of Echocardiogram examinations, most                  recent 10/10/2014. Signs/Symptoms:Murmur. ASD repair.  Sonographer:     Sherrie Sport RDCS (AE) Referring Phys:  [id number] AMY N COX Diagnosing Phys: Serafina Royals MD  Sonographer Comments: Suboptimal apical window. IMPRESSIONS  1. Left ventricular ejection fraction, by estimation, is 60 to 65%. The left ventricle has  normal function. The left ventricle has no regional wall motion  abnormalities. Left ventricular diastolic parameters were normal.  2. Right ventricular systolic function is normal. The right ventricular size is normal.  3. The mitral valve is normal in structure. Trivial mitral valve regurgitation.  4. The aortic valve is normal in structure. Aortic valve regurgitation is trivial. FINDINGS  Left Ventricle: Left ventricular ejection fraction, by estimation, is 60 to 65%. The left ventricle has normal function. The left ventricle has no regional wall motion abnormalities. The left ventricular internal cavity size was small. There is no left ventricular hypertrophy. Left ventricular diastolic parameters were normal. Right Ventricle: The right ventricular size is normal. No increase in right ventricular wall thickness. Right ventricular systolic function is normal. Left Atrium: Left atrial size was normal in size. Right Atrium: Right atrial size was normal in size. Pericardium: There is no evidence of pericardial effusion. Mitral Valve: The mitral valve is normal in structure. Trivial mitral valve regurgitation. Tricuspid Valve: The tricuspid valve is normal in structure. Tricuspid valve regurgitation is trivial. Aortic Valve: The aortic valve is normal in structure. Aortic valve regurgitation is trivial. Aortic valve mean gradient measures 5.0 mmHg. Aortic valve peak gradient measures 8.1 mmHg. Aortic valve area, by VTI measures 4.30 cm. Pulmonic Valve: The pulmonic valve was normal in structure. Pulmonic valve regurgitation is not visualized. Aorta: The aortic root and ascending aorta are structurally normal, with no evidence of dilitation. IAS/Shunts: No atrial level shunt detected by color flow Doppler.  LEFT VENTRICLE PLAX 2D LVIDd:         3.30 cm  Diastology LVIDs:         2.05 cm  LV e' medial:    4.35 cm/s LV PW:         1.22 cm  LV E/e' medial:  16.2 LV IVS:        0.84 cm  LV e' lateral:   7.07 cm/s LVOT diam:     2.00 cm  LV E/e' lateral: 9.9 LV SV:         85 LV SV  Index:   55 LVOT Area:     3.14 cm  RIGHT VENTRICLE RV Basal diam:  2.48 cm RV S prime:     13.10 cm/s TAPSE (M-mode): 3.3 cm LEFT ATRIUM             Index       RIGHT ATRIUM           Index LA diam:        3.40 cm 2.20 cm/m  RA Area:     15.80 cm LA Vol (A2C):   58.9 ml 38.11 ml/m RA Volume:   41.50 ml  26.85 ml/m LA Vol (A4C):   40.5 ml 26.20 ml/m LA Biplane Vol: 49.4 ml 31.96 ml/m  AORTIC VALVE                    PULMONIC VALVE AV Area (Vmax):    3.01 cm     PV Vmax:        0.94 m/s AV Area (Vmean):   3.48 cm     PV Peak grad:   3.5 mmHg AV Area (VTI):     4.30 cm     RVOT Peak grad: 4 mmHg AV Vmax:           142.00 cm/s AV Vmean:          97.500 cm/s AV VTI:  0.198 m AV Peak Grad:      8.1 mmHg AV Mean Grad:      5.0 mmHg LVOT Vmax:         136.00 cm/s LVOT Vmean:        108.000 cm/s LVOT VTI:          0.271 m LVOT/AV VTI ratio: 1.37  AORTA Ao Root diam: 2.62 cm MITRAL VALVE                TRICUSPID VALVE MV Area (PHT): 4.74 cm     TR Peak grad:   28.9 mmHg MV Decel Time: 160 msec     TR Vmax:        269.00 cm/s MV E velocity: 70.30 cm/s MV A velocity: 118.00 cm/s  SHUNTS MV E/A ratio:  0.60         Systemic VTI:  0.27 m                             Systemic Diam: 2.00 cm Serafina Royals MD Electronically signed by Serafina Royals MD Signature Date/Time: 04/30/2020/2:15:30 PM    Final       DVT prophylaxis: SCDs  Code Status: Full code  Family Communication: No family at bedside   Consultants:    Procedures:       Objective    Physical Examination:    General-appears in no acute distress  Heart-S1-S2, regular, no murmur auscultated  Lungs-clear to auscultation bilaterally, no wheezing or crackles auscultated  Abdomen-soft, nontender, no organomegaly  Extremities-no edema in the lower extremities  Neuro-alert, oriented x3, no focal deficit noted   Status is: Inpatient  Dispo: The patient is from: Home              Anticipated d/c is to: Home               Anticipated d/c date is: 05/02/2020              Patient currently not stable for discharge  Barrier to discharge-ongoing evaluation for syncope  COVID-19 Labs  No results for input(s): DDIMER, FERRITIN, LDH, CRP in the last 72 hours.  Lab Results  Component Value Date   SARSCOV2NAA NEGATIVE 04/28/2020   [place] NEGATIVE 02/24/2020   [place] Not Detected 02/09/2020   [place] NEGATIVE 12/06/2019    Microbiology  Recent Results (from the past 240 hour(s))  Resp Panel by RT-PCR (Flu A&B, Covid) Nasopharyngeal Swab     Status: None   Collection Time: 04/28/20  6:40 PM   Specimen: Nasopharyngeal Swab; Nasopharyngeal(NP) swabs in vial transport medium  Result Value Ref Range Status   SARS Coronavirus 2 by RT PCR NEGATIVE NEGATIVE Final    Comment: (NOTE) SARS-CoV-2 target nucleic acids are NOT DETECTED.  The SARS-CoV-2 RNA is generally detectable in upper respiratory specimens during the acute phase of infection. The lowest concentration of SARS-CoV-2 viral copies this assay can detect is 138 copies/mL. A negative result does not preclude SARS-Cov-2 infection and should not be used as the sole basis for treatment or other patient management decisions. A negative result may occur with  improper specimen collection/handling, submission of specimen other than nasopharyngeal swab, presence of viral mutation(s) within the areas targeted by this assay, and inadequate number of viral copies(<138 copies/mL). A negative result must be combined with clinical observations, patient history, and epidemiological information. The expected result is Negative.  Fact Sheet for Patients:  EntrepreneurPulse.com.au  Fact Sheet for Healthcare Providers:  IncredibleEmployment.be  This test is no t yet approved or cleared by the [place] FDA and  has been authorized for detection and/or diagnosis of SARS-CoV-2 by FDA under an Emergency Use  Authorization (EUA). This EUA will remain  in effect (meaning this test can be used) for the duration of the COVID-19 declaration under Section 564(b)(1) of the Act, 21 U.S.C.section 360bbb-3(b)(1), unless the authorization is terminated  or revoked sooner.       Influenza A by PCR NEGATIVE NEGATIVE Final   Influenza B by PCR NEGATIVE NEGATIVE Final    Comment: (NOTE) The Xpert Xpress SARS-CoV-2/FLU/RSV plus assay is intended as an aid in the diagnosis of influenza from Nasopharyngeal swab specimens and should not be used as a sole basis for treatment. Nasal washings and aspirates are unacceptable for Xpert Xpress SARS-CoV-2/FLU/RSV testing.  Fact Sheet for Patients: EntrepreneurPulse.com.au  Fact Sheet for Healthcare Providers: IncredibleEmployment.be  This test is not yet approved or cleared by the [place] FDA and has been authorized for detection and/or diagnosis of SARS-CoV-2 by FDA under an Emergency Use Authorization (EUA). This EUA will remain in effect (meaning this test can be used) for the duration of the COVID-19 declaration under Section 564(b)(1) of the Act, 21 U.S.C. section 360bbb-3(b)(1), unless the authorization is terminated or revoked.  Performed at [facility], [address], [place], [place] [address]              [facility] If 7PM-7AM, please contact night-coverage at [email or web address], Office  [phone]   05/01/2020, 4:09 PM  LOS: 2 days

## 2020-05-02 ENCOUNTER — Telehealth: Payer: Self-pay | Admitting: Family Medicine

## 2020-05-02 DIAGNOSIS — R55 Syncope and collapse: Secondary | ICD-10-CM

## 2020-05-02 LAB — COMPREHENSIVE METABOLIC PANEL
ALT: 9 U/L (ref 0–44)
AST: 16 U/L (ref 15–41)
Albumin: 3.6 g/dL (ref 3.5–5.0)
Alkaline Phosphatase: 55 U/L (ref 38–126)
Anion gap: 6 (ref 5–15)
BUN: 21 mg/dL — ABNORMAL HIGH (ref 6–20)
CO2: 26 mmol/L (ref 22–32)
Calcium: 9.1 mg/dL (ref 8.9–10.3)
Chloride: 106 mmol/L (ref 98–111)
Creatinine, Ser: 0.91 mg/dL (ref 0.44–1.00)
GFR, Estimated: >60 mL/min (ref >60)
Glucose, Bld: 99 mg/dL (ref 70–99)
Potassium: 3.9 mmol/L (ref 3.5–5.1)
Sodium: 138 mmol/L (ref 135–145)
Total Bilirubin: 0.8 mg/dL (ref 0.3–1.2)
Total Protein: 6.3 g/dL — ABNORMAL LOW (ref 6.5–8.1)

## 2020-05-02 LAB — CBC
HCT: 34.9 % — ABNORMAL LOW (ref 36.0–46.0)
Hemoglobin: 11.6 g/dL — ABNORMAL LOW (ref 12.0–15.0)
MCH: 27.6 pg (ref 26.0–34.0)
MCHC: 33.2 g/dL (ref 30.0–36.0)
MCV: 82.9 fL (ref 80.0–100.0)
Platelets: 156 10*3/uL (ref 150–400)
RBC: 4.21 MIL/uL (ref 3.87–5.11)
RDW: 13.8 % (ref 11.5–15.5)
WBC: 5.5 10*3/uL (ref 4.0–10.5)
nRBC: 0 % (ref 0.0–0.2)

## 2020-05-02 MED ORDER — CYANOCOBALAMIN 1000 MCG PO TABS: 1000.0000 ug | ORAL_TABLET | Freq: Every day | ORAL | 0 refills | Status: DC

## 2020-05-02 NOTE — Progress Notes (Signed)
Erin Richards to be D/C'd Home per MD order.  Discussed prescriptions and follow up appointments with the patient. Prescriptions given to patient, medication list explained in detail. Pt verbalized understanding.  Allergies as of 05/02/2020      Reactions   Acetaminophen-codeine Nausea And Vomiting   Antiseptic Oral Rinse [cetylpyridinium Chloride] Other (See Comments)   Mouth sores   Aspartame Other (See Comments)   Reaction: unknown   Biaxin [clarithromycin] Nausea And Vomiting   Carafate [sucralfate]    Pt says her "Stomach hurts" when she takes it.    Chlorhexidine Gluconate Nausea And Vomiting   Clindamycin/lincomycin Nausea And Vomiting   Codeine Itching, Nausea And Vomiting   Dextromethorphan Hbr Other (See Comments)   Reaction: unknown   Dilaudid [hydromorphone Hcl] Nausea And Vomiting   Doxycycline Nausea And Vomiting   Fentanyl Nausea And Vomiting   Fluticasone-salmeterol Other (See Comments)   Blister in mouth   Germanium Other (See Comments)   Hydrocodone Nausea And Vomiting   Ketorolac Other (See Comments)   Levofloxacin Other (See Comments)   GI upset   Mefenamic Acid Nausea And Vomiting   Metformin And Related Nausea And Vomiting   Metronidazole Diarrhea, Nausea And Vomiting   Morphine And Related Nausea And Vomiting   Moxifloxacin Swelling   Nitrofurantoin Nausea And Vomiting, Other (See Comments)   Nsaids Other (See Comments)   Reaction: unknown   Oxycodone-acetaminophen Nausea And Vomiting   Periguard [dimethicone] Nausea And Vomiting   Permethrin Other (See Comments)   Pt's mind was racing all the time.   Phenothiazines Nausea And Vomiting   Pioglitazone Nausea And Vomiting   Quinidine Nausea And Vomiting   Quinolones Nausea And Vomiting   Rumex Crispus Other (See Comments)   Tetracyclines & Related Nausea And Vomiting   Toradol [ketorolac Tromethamine] Nausea And Vomiting   Tramadol Nausea And Vomiting   Tussin [guaifenesin] Nausea And Vomiting    Tussionex Pennkinetic Er [hydrocod Polst-cpm Polst Er] Nausea And Vomiting   Buprenorphine Hcl Nausea And Vomiting   Lincomycin Hcl Nausea And Vomiting   Oxycodone-acetaminophen Hives, Nausea And Vomiting   Phenylalanine Nausea And Vomiting      Medication List    STOP taking these medications   ipratropium 0.03 % nasal spray Commonly known as: ATROVENT   levocetirizine 5 MG tablet Commonly known as: XYZAL   montelukast 10 MG tablet Commonly known as: SINGULAIR     TAKE these medications   Advair Diskus 250-50 MCG/DOSE Aepb Generic drug: Fluticasone-Salmeterol INHALE 1 PUFF INTO THE LUNGS 2 TIMES DAILY. RINSE MOUTH AFTER USE.   albuterol 108 (90 Base) MCG/ACT inhaler Commonly known as: ProAir HFA Inhale 1-2 puffs into the lungs every 6 (six) hours as needed for shortness of breath.   APPLE CIDER VINEGAR PO Take 1 tablet by mouth daily.   azelastine 0.1 % nasal spray Commonly known as: ASTELIN PLACE 1 SPRAY INTO BOTH NOSTRILS 2 (TWO) TIMES DAILY.   cetirizine 10 MG tablet Commonly known as: ZYRTEC Take 1 tablet (10 mg total) by mouth daily.   citalopram 10 MG tablet Commonly known as: CELEXA TAKE 1 TABLET BY MOUTH EVERY DAY   Combivent Respimat 20-100 MCG/ACT Aers respimat Generic drug: Ipratropium-Albuterol INHALE 1 PUFF INTO THE LUNGS EVERY 4 HOURS AS NEEDED FOR WHEEZING.   cyanocobalamin 1000 MCG tablet Take 1 tablet (1,000 mcg total) by mouth daily. Start taking on: May 03, 2020   gabapentin 300 MG capsule Commonly known as: NEURONTIN Take 1 capsule (300  mg total) by mouth daily.   hyoscyamine 0.125 MG tablet Commonly known as: LEVSIN TAKE ONE TABLET BY MOUTH EVERY 6 HOURS AS NEEDED FOR STOMACH CRAMPINGYS   multivitamin with minerals Tabs tablet Take 2 tablets by mouth daily.   nortriptyline 25 MG capsule Commonly known as: PAMELOR Take 1 capsule (25 mg total) by mouth at bedtime. For migraine prevention and IBS   ondansetron 4 MG disintegrating  tablet Commonly known as: ZOFRAN-ODT Take 1 tablet (4 mg total) by mouth every 6 (six) hours as needed for nausea or vomiting.   OXYGEN Inhale 2 L into the lungs at bedtime as needed.   pantoprazole 20 MG tablet Commonly known as: PROTONIX Take 20 mg by mouth daily.   promethazine 12.5 MG tablet Commonly known as: PHENERGAN Take 1 tablet (12.5 mg total) by mouth every 8 (eight) hours as needed for nausea or vomiting.   tiotropium 18 MCG inhalation capsule Commonly known as: SPIRIVA Place 1 capsule (18 mcg total) into inhaler and inhale daily. What changed:   when to take this  reasons to take this   zolpidem 5 MG tablet Commonly known as: AMBIEN Take 1 tablet (5 mg total) by mouth at bedtime.       Vitals:   05/02/20 0509 05/02/20 0727  BP: 120/62 110/61  Pulse: 81 71  Resp:  17  Temp: 97.6 F (36.4 C) 98.1 F (36.7 C)  SpO2: 93% 94%    Tele box removed and returned. Skin clean, dry and intact without evidence of skin break down, no evidence of skin tears noted. IV catheter discontinued intact. Site without signs and symptoms of complications. Dressing and pressure applied. Pt denies pain at this time. No complaints noted.  An After Visit Summary was printed and given to the patient. Patient escorted via [place], and D/C home via private auto.  Rolley Sims

## 2020-05-02 NOTE — Telephone Encounter (Signed)
"  Pt needs a glucometer and testing supplies, pt also wants Vitamin B12  Lancets, strips, monitor, needles"  Erin Richards states "she might need Vitamin B-12 shots, will learn more today. Medicaid is very specific, she had a hypoglycemic episode for five days."   [id number] - [place], [place] - [address]  [address] [address]  Phone: [phone] Fax: [phone]   [facility] Nurse Case Manager Best contact: [phone]

## 2020-05-02 NOTE — Discharge Summary (Signed)
Physician Discharge Summary  Erin Richards ZOX:[id number] DOB: 09/11/1965 DOA: 04/28/2020  PCP: Birdie Sons, MD  Admit date: 04/28/2020 Discharge date: 05/02/2020  Time spent: 60* minutes  Recommendations for Outpatient Follow-up:  1. Follow-up PCP in 2 weeks 2. Follow-up with cardiology [facility] clinic today after discharge for Holter monitor set up. 3. Recheck B12 level in 4 weeks.   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   Cervical spinal stenosis   DDD (degenerative disc disease), cervical   Deficiency of vitamin K   Hemorrhoids, internal   Bundle branch block, right   Acid reflux   Reactive airway disease   Chronic left shoulder pain   H/O benign breast biopsy   MDD (major depressive disorder), recurrent episode, mild ([place])   Syncope   Discharge Condition: Stable  Diet recommendation: Heart healthy diet  Filed Weights   04/28/20 1422  Weight: 63.5 kg    History of present illness:  55 year old female with history of atrial septal defect, TIA, DVT, tricuspid regurgitation who presented with confusion and possible syncopal event.  As per patient, she was with significant other at discharge and was not feeling herself.  She felt weak and was leaning to the right.  He gave up drinking fruit snack after that she felt any better.  After the left discharge and was driving in the car she slammed her head down and was unresponsive.  She was lethargic.  EMS was called.  Her systolic blood pressure in the ED was at [place] [facility] Course:   1. Syncope-MRI was negative for stroke.  Echocardiogram is unremarkable.  Telemetry monitoring shows no significant abnormality.  Neurology saw the patient and felt that it was from hypoglycemia and hypotension which were explaining her symptoms.   Hemoglobin A1c is 4.1.  Random cortisol was 5,  cosyntropin stimulation test showed adequate response of serum cortisol at 30 minutes-18 mcg/dL and 60 minutes-22 mcg/dL.  Orthostatic vital signs were negative for postural hypotension.  I called and discussed with cardiology, Dr. Saralyn Pilar.  He recommends Holter monitor as outpatient.  Patient will go to cardiology clinic today after discharge to get Holter monitor set up.  We will also set up TED hose before discharge.  2. Hypoglycemia/hypotension-patient hemoglobin A1c is 4.1, serum insulin and C-peptide level have been obtained.  Patient blood sugar was less than 100 but never had significant hypoglycemia with symptoms.  I called and discussed with endocrinology at [facility].  Endocrinologist reviewed the chart and does not feel that patient is having significant hypoglycemia.  At this time I have advised patient to eat frequent meals.  3. Neuropathy-patient B12 was in normal range 316.  Started on B12 1000 mcg daily.  Will need to recheck B12 level in 4 weeks.  4. Depression/anxiety-continue citalopram.     Procedures:    Consultations:  Neurology  Discharge Exam: Vitals:   05/02/20 0509 05/02/20 0727  BP: 120/62 110/61  Pulse: 81 71  Resp:  17  Temp: 97.6 F (36.4 C) 98.1 F (36.7 C)  SpO2: 93% 94%    General: Appears in no acute distress Cardiovascular: S1-S2, regular Respiratory: Clear to auscultation bilaterally  Discharge Instructions   Discharge Instructions    Diet - low sodium heart healthy   Complete by: As directed    Discharge instructions   Complete by: As directed  Go tp [facility] cardiology today  after discharge for holter monitor  set up   Increase activity slowly   Complete by: As directed      Allergies as of 05/02/2020      Reactions   Acetaminophen-codeine Nausea And Vomiting   Antiseptic Oral Rinse [cetylpyridinium Chloride] Other (See Comments)   Mouth sores   Aspartame Other (See Comments)   Reaction: unknown   Biaxin  [clarithromycin] Nausea And Vomiting   Carafate [sucralfate]    Pt says her "Stomach hurts" when she takes it.    Chlorhexidine Gluconate Nausea And Vomiting   Clindamycin/lincomycin Nausea And Vomiting   Codeine Itching, Nausea And Vomiting   Dextromethorphan Hbr Other (See Comments)   Reaction: unknown   Dilaudid [hydromorphone Hcl] Nausea And Vomiting   Doxycycline Nausea And Vomiting   Fentanyl Nausea And Vomiting   Fluticasone-salmeterol Other (See Comments)   Blister in mouth   Germanium Other (See Comments)   Hydrocodone Nausea And Vomiting   Ketorolac Other (See Comments)   Levofloxacin Other (See Comments)   GI upset   Mefenamic Acid Nausea And Vomiting   Metformin And Related Nausea And Vomiting   Metronidazole Diarrhea, Nausea And Vomiting   Morphine And Related Nausea And Vomiting   Moxifloxacin Swelling   Nitrofurantoin Nausea And Vomiting, Other (See Comments)   Nsaids Other (See Comments)   Reaction: unknown   Oxycodone-acetaminophen Nausea And Vomiting   Periguard [dimethicone] Nausea And Vomiting   Permethrin Other (See Comments)   Pt's mind was racing all the time.   Phenothiazines Nausea And Vomiting   Pioglitazone Nausea And Vomiting   Quinidine Nausea And Vomiting   Quinolones Nausea And Vomiting   Rumex Crispus Other (See Comments)   Tetracyclines & Related Nausea And Vomiting   Toradol [ketorolac Tromethamine] Nausea And Vomiting   Tramadol Nausea And Vomiting   Tussin [guaifenesin] Nausea And Vomiting   Tussionex Pennkinetic Er [hydrocod Polst-cpm Polst Er] Nausea And Vomiting   Buprenorphine Hcl Nausea And Vomiting   Lincomycin Hcl Nausea And Vomiting   Oxycodone-acetaminophen Hives, Nausea And Vomiting   Phenylalanine Nausea And Vomiting      Medication List    STOP taking these medications   ipratropium 0.03 % nasal spray Commonly known as: ATROVENT   levocetirizine 5 MG tablet Commonly known as: XYZAL   montelukast 10 MG  tablet Commonly known as: SINGULAIR     TAKE these medications   Advair Diskus 250-50 MCG/DOSE Aepb Generic drug: Fluticasone-Salmeterol INHALE 1 PUFF INTO THE LUNGS 2 TIMES DAILY. RINSE MOUTH AFTER USE.   albuterol 108 (90 Base) MCG/ACT inhaler Commonly known as: ProAir HFA Inhale 1-2 puffs into the lungs every 6 (six) hours as needed for shortness of breath.   APPLE CIDER VINEGAR PO Take 1 tablet by mouth daily.   azelastine 0.1 % nasal spray Commonly known as: ASTELIN PLACE 1 SPRAY INTO BOTH NOSTRILS 2 (TWO) TIMES DAILY.   cetirizine 10 MG tablet Commonly known as: ZYRTEC Take 1 tablet (10 mg total) by mouth daily.   citalopram 10 MG tablet Commonly known as: CELEXA TAKE 1 TABLET BY MOUTH EVERY DAY   Combivent Respimat 20-100 MCG/ACT Aers respimat Generic drug: Ipratropium-Albuterol INHALE 1 PUFF INTO THE LUNGS EVERY 4 HOURS AS NEEDED FOR WHEEZING.   cyanocobalamin 1000 MCG tablet Take 1 tablet (1,000 mcg total) by mouth daily. Start taking on: May 03, 2020   gabapentin 300 MG capsule Commonly known as: NEURONTIN Take 1 capsule (300 mg  total) by mouth daily.   hyoscyamine 0.125 MG tablet Commonly known as: LEVSIN TAKE ONE TABLET BY MOUTH EVERY 6 HOURS AS NEEDED FOR STOMACH CRAMPINGYS   multivitamin with minerals Tabs tablet Take 2 tablets by mouth daily.   nortriptyline 25 MG capsule Commonly known as: PAMELOR Take 1 capsule (25 mg total) by mouth at bedtime. For migraine prevention and IBS   ondansetron 4 MG disintegrating tablet Commonly known as: ZOFRAN-ODT Take 1 tablet (4 mg total) by mouth every 6 (six) hours as needed for nausea or vomiting.   OXYGEN Inhale 2 L into the lungs at bedtime as needed.   pantoprazole 20 MG tablet Commonly known as: PROTONIX Take 20 mg by mouth daily.   promethazine 12.5 MG tablet Commonly known as: PHENERGAN Take 1 tablet (12.5 mg total) by mouth every 8 (eight) hours as needed for nausea or vomiting.    tiotropium 18 MCG inhalation capsule Commonly known as: SPIRIVA Place 1 capsule (18 mcg total) into inhaler and inhale daily. What changed:   when to take this  reasons to take this   zolpidem 5 MG tablet Commonly known as: AMBIEN Take 1 tablet (5 mg total) by mouth at bedtime.      Allergies  Allergen Reactions  . Acetaminophen-Codeine Nausea And Vomiting  . Antiseptic Oral Rinse [Cetylpyridinium Chloride] Other (See Comments)    Mouth sores  . Aspartame Other (See Comments)    Reaction: unknown  . Biaxin [Clarithromycin] Nausea And Vomiting  . Carafate [Sucralfate]     Pt says her "Stomach hurts" when she takes it.    . Chlorhexidine Gluconate Nausea And Vomiting  . Clindamycin/Lincomycin Nausea And Vomiting  . Codeine Itching and Nausea And Vomiting  . Dextromethorphan Hbr Other (See Comments)    Reaction: unknown  . Dilaudid [Hydromorphone Hcl] Nausea And Vomiting  . Doxycycline Nausea And Vomiting  . Fentanyl Nausea And Vomiting  . Fluticasone-Salmeterol Other (See Comments)    Blister in mouth  . Germanium Other (See Comments)  . Hydrocodone Nausea And Vomiting  . Ketorolac Other (See Comments)  . Levofloxacin Other (See Comments)    GI upset  . Mefenamic Acid Nausea And Vomiting  . Metformin And Related Nausea And Vomiting  . Metronidazole Diarrhea and Nausea And Vomiting  . Morphine And Related Nausea And Vomiting  . Moxifloxacin Swelling  . Nitrofurantoin Nausea And Vomiting and Other (See Comments)  . Nsaids Other (See Comments)    Reaction: unknown  . Oxycodone-Acetaminophen Nausea And Vomiting  . Periguard [Dimethicone] Nausea And Vomiting  . Permethrin Other (See Comments)    Pt's mind was racing all the time.  . Phenothiazines Nausea And Vomiting  . Pioglitazone Nausea And Vomiting  . Quinidine Nausea And Vomiting  . Quinolones Nausea And Vomiting  . Rumex Crispus Other (See Comments)  . Tetracyclines & Related Nausea And Vomiting  . Toradol  [Ketorolac Tromethamine] Nausea And Vomiting  . Tramadol Nausea And Vomiting  . Tussin [Guaifenesin] Nausea And Vomiting  . Tussionex Pennkinetic Er [Hydrocod Polst-Cpm Polst Er] Nausea And Vomiting  . Buprenorphine Hcl Nausea And Vomiting  . Lincomycin Hcl Nausea And Vomiting  . Oxycodone-Acetaminophen Hives and Nausea And Vomiting  . Phenylalanine Nausea And Vomiting    Follow-up Information    Birdie Sons, MD Follow up in 2 week(s).   Specialty: Family Medicine Contact information: [address] [place] [address] [phone]        Isaias Cowman, MD Follow up.  Specialty: Cardiology Why: Go to clinic after discharge today for holter set up Contact information: [facility] [place] [address] [phone]                The results of significant diagnostics from this hospitalization (including imaging, microbiology, ancillary and laboratory) are listed below for reference.    Significant Diagnostic Studies: CT HEAD WO CONTRAST  Result Date: 04/28/2020 CLINICAL DATA:  Mental status change, dizziness last night, difficulty walking at church this morning, difficulty communicating, appears tired and fatigued. History EXAM: CT HEAD WITHOUT CONTRAST TECHNIQUE: Contiguous axial images were obtained from the base of the skull through the vertex without intravenous contrast. Sagittal and coronal MPR images reconstructed from axial data set. COMPARISON:  11/28/2019 FINDINGS: Brain: Normal ventricular morphology. No midline shift or mass effect. Normal appearance of brain parenchyma. No intracranial hemorrhage, mass lesion, evidence of acute infarction, or extra-axial fluid collection. Vascular: No hyperdense vessels Skull: Intact Sinuses/Orbits: Clear Other: N/A IMPRESSION: Normal exam. Electronically Signed   By: Lavonia Dana M.D.   On: 04/28/2020 16:26   MR BRAIN WO CONTRAST  Result Date:  04/28/2020 CLINICAL DATA:  Transient ischemic attack EXAM: MRI HEAD WITHOUT CONTRAST TECHNIQUE: Multiplanar, multiecho pulse sequences of the brain and surrounding structures were obtained without intravenous contrast. COMPARISON:  None. FINDINGS: Brain: No acute infarct, mass effect or extra-axial collection. No acute or chronic hemorrhage. Normal white matter signal, parenchymal volume and CSF spaces. The midline structures are normal. Vascular: Major flow voids are preserved. Skull and upper cervical spine: Normal calvarium and skull base. Visualized upper cervical spine and soft tissues are normal. Sinuses/Orbits:No paranasal sinus fluid levels or advanced mucosal thickening. No mastoid or middle ear effusion. Normal orbits. IMPRESSION: Normal brain MRI. Electronically Signed   By: Ulyses Jarred M.D.   On: 04/28/2020 21:56   ECHOCARDIOGRAM COMPLETE  Result Date: 04/30/2020    ECHOCARDIOGRAM REPORT   Patient Name:   ANIQUA BRIERE Date of Exam: 04/30/2020 Medical Rec #:  [id number]      Height:       57.0 in Accession #:    [id number]     Weight:       140.0 lb Date of Birth:  [birth year]      BSA:          1.546 m Patient Age:    35 years       BP:           105/45 mmHg Patient Gender: F              HR:           78 bpm. Exam Location:  [facility] Procedure: 2D Echo, Cardiac Doppler and Color Doppler Indications:     TIA G45.9  History:         Patient has prior history of Echocardiogram examinations, most                  recent 10/10/2014. Signs/Symptoms:Murmur. ASD repair.  Sonographer:     Sherrie Sport RDCS (AE) Referring Phys:  [id number] AMY N COX Diagnosing Phys: Serafina Royals MD  Sonographer Comments: Suboptimal apical window. IMPRESSIONS  1. Left ventricular ejection fraction, by estimation, is 60 to 65%. The left ventricle has normal function. The left ventricle has no regional wall motion abnormalities. Left ventricular diastolic parameters were normal.  2. Right ventricular systolic function is normal. The  right ventricular size is normal.  3. The mitral valve  is normal in structure. Trivial mitral valve regurgitation.  4. The aortic valve is normal in structure. Aortic valve regurgitation is trivial. FINDINGS  Left Ventricle: Left ventricular ejection fraction, by estimation, is 60 to 65%. The left ventricle has normal function. The left ventricle has no regional wall motion abnormalities. The left ventricular internal cavity size was small. There is no left ventricular hypertrophy. Left ventricular diastolic parameters were normal. Right Ventricle: The right ventricular size is normal. No increase in right ventricular wall thickness. Right ventricular systolic function is normal. Left Atrium: Left atrial size was normal in size. Right Atrium: Right atrial size was normal in size. Pericardium: There is no evidence of pericardial effusion. Mitral Valve: The mitral valve is normal in structure. Trivial mitral valve regurgitation. Tricuspid Valve: The tricuspid valve is normal in structure. Tricuspid valve regurgitation is trivial. Aortic Valve: The aortic valve is normal in structure. Aortic valve regurgitation is trivial. Aortic valve mean gradient measures 5.0 mmHg. Aortic valve peak gradient measures 8.1 mmHg. Aortic valve area, by VTI measures 4.30 cm. Pulmonic Valve: The pulmonic valve was normal in structure. Pulmonic valve regurgitation is not visualized. Aorta: The aortic root and ascending aorta are structurally normal, with no evidence of dilitation. IAS/Shunts: No atrial level shunt detected by color flow Doppler.  LEFT VENTRICLE PLAX 2D LVIDd:         3.30 cm  Diastology LVIDs:         2.05 cm  LV e' medial:    4.35 cm/s LV PW:         1.22 cm  LV E/e' medial:  16.2 LV IVS:        0.84 cm  LV e' lateral:   7.07 cm/s LVOT diam:     2.00 cm  LV E/e' lateral: 9.9 LV SV:         85 LV SV Index:   55 LVOT Area:     3.14 cm  RIGHT VENTRICLE RV Basal diam:  2.48 cm RV S prime:     13.10 cm/s TAPSE (M-mode): 3.3  cm LEFT ATRIUM             Index       RIGHT ATRIUM           Index LA diam:        3.40 cm 2.20 cm/m  RA Area:     15.80 cm LA Vol (A2C):   58.9 ml 38.11 ml/m RA Volume:   41.50 ml  26.85 ml/m LA Vol (A4C):   40.5 ml 26.20 ml/m LA Biplane Vol: 49.4 ml 31.96 ml/m  AORTIC VALVE                    PULMONIC VALVE AV Area (Vmax):    3.01 cm     PV Vmax:        0.94 m/s AV Area (Vmean):   3.48 cm     PV Peak grad:   3.5 mmHg AV Area (VTI):     4.30 cm     RVOT Peak grad: 4 mmHg AV Vmax:           142.00 cm/s AV Vmean:          97.500 cm/s AV VTI:            0.198 m AV Peak Grad:      8.1 mmHg AV Mean Grad:      5.0 mmHg LVOT Vmax:  136.00 cm/s LVOT Vmean:        108.000 cm/s LVOT VTI:          0.271 m LVOT/AV VTI ratio: 1.37  AORTA Ao Root diam: 2.62 cm MITRAL VALVE                TRICUSPID VALVE MV Area (PHT): 4.74 cm     TR Peak grad:   28.9 mmHg MV Decel Time: 160 msec     TR Vmax:        269.00 cm/s MV E velocity: 70.30 cm/s MV A velocity: 118.00 cm/s  SHUNTS MV E/A ratio:  0.60         Systemic VTI:  0.27 m                             Systemic Diam: 2.00 cm Serafina Royals MD Electronically signed by Serafina Royals MD Signature Date/Time: 04/30/2020/2:15:30 PM    Final     Microbiology: Recent Results (from the past 240 hour(s))  Resp Panel by RT-PCR (Flu A&B, Covid) Nasopharyngeal Swab     Status: None   Collection Time: 04/28/20  6:40 PM   Specimen: Nasopharyngeal Swab; Nasopharyngeal(NP) swabs in vial transport medium  Result Value Ref Range Status   SARS Coronavirus 2 by RT PCR NEGATIVE NEGATIVE Final    Comment: (NOTE) SARS-CoV-2 target nucleic acids are NOT DETECTED.  The SARS-CoV-2 RNA is generally detectable in upper respiratory specimens during the acute phase of infection. The lowest concentration of SARS-CoV-2 viral copies this assay can detect is 138 copies/mL. A negative result does not preclude SARS-Cov-2 infection and should not be used as the sole basis for treatment  or other patient management decisions. A negative result may occur with  improper specimen collection/handling, submission of specimen other than nasopharyngeal swab, presence of viral mutation(s) within the areas targeted by this assay, and inadequate number of viral copies(<138 copies/mL). A negative result must be combined with clinical observations, patient history, and epidemiological information. The expected result is Negative.  Fact Sheet for Patients:  EntrepreneurPulse.com.au  Fact Sheet for Healthcare Providers:  IncredibleEmployment.be  This test is no t yet approved or cleared by the [place] FDA and  has been authorized for detection and/or diagnosis of SARS-CoV-2 by FDA under an Emergency Use Authorization (EUA). This EUA will remain  in effect (meaning this test can be used) for the duration of the COVID-19 declaration under Section 564(b)(1) of the Act, 21 U.S.C.section 360bbb-3(b)(1), unless the authorization is terminated  or revoked sooner.       Influenza A by PCR NEGATIVE NEGATIVE Final   Influenza B by PCR NEGATIVE NEGATIVE Final    Comment: (NOTE) The Xpert Xpress SARS-CoV-2/FLU/RSV plus assay is intended as an aid in the diagnosis of influenza from Nasopharyngeal swab specimens and should not be used as a sole basis for treatment. Nasal washings and aspirates are unacceptable for Xpert Xpress SARS-CoV-2/FLU/RSV testing.  Fact Sheet for Patients: EntrepreneurPulse.com.au  Fact Sheet for Healthcare Providers: IncredibleEmployment.be  This test is not yet approved or cleared by the [place] FDA and has been authorized for detection and/or diagnosis of SARS-CoV-2 by FDA under an Emergency Use Authorization (EUA). This EUA will remain in effect (meaning this test can be used) for the duration of the COVID-19 declaration under Section 564(b)(1) of the Act, 21 U.S.C. section  360bbb-3(b)(1), unless the authorization is terminated or revoked.  Performed at [facility]  Lab, [place], [place] [address]      Labs: Basic Metabolic Panel: Recent Labs  Lab 04/28/20 1424 04/30/20 0723 05/02/20 0408  NA 141 140 138  K 3.5 4.0 3.9  CL 111 109 106  CO2 26 27 26   GLUCOSE 76 99 99  BUN 14 13 21*  CREATININE 0.54 0.71 0.91  CALCIUM 8.6* 8.9 9.1  PHOS  --  3.5  --    Liver Function Tests: Recent Labs  Lab 04/28/20 1424 04/30/20 0723 05/02/20 0408  AST 22  --  16  ALT 10  --  9  ALKPHOS 52  --  55  BILITOT 1.0  --  0.8  PROT 6.6  --  6.3*  ALBUMIN 3.9 3.6 3.6   No results for input(s): LIPASE, AMYLASE in the last 168 hours. No results for input(s): AMMONIA in the last 168 hours. CBC: Recent Labs  Lab 04/28/20 1424 04/30/20 0723 05/02/20 0408  WBC 4.8 3.0* 5.5  NEUTROABS 2.5  --   --   HGB 12.3 11.9* 11.6*  HCT 38.3 34.7* 34.9*  MCV 85.7 80.9 82.9  PLT 155 143* 156   Cardiac Enzymes: No results for input(s): CKTOTAL, CKMB, CKMBINDEX, TROPONINI in the last 168 hours. BNP: BNP (last 3 results) Recent Labs    04/28/20 1424  BNP 77.2    ProBNP (last 3 results) No results for input(s): PROBNP in the last 8760 hours.  CBG: Recent Labs  Lab 04/30/20 1609 04/30/20 2009 04/30/20 2336 05/01/20 0331 05/01/20 0748  GLUCAP 93 113* 103* 91 90       Signed:  Oswald Hillock MD.  [facility] 05/02/2020, 11:15 AM

## 2020-05-02 NOTE — Plan of Care (Signed)
  Problem: Clinical Measurements: Goal: Ability to maintain clinical measurements within normal limits will improve Outcome: Progressing Goal: Diagnostic test results will improve Outcome: Progressing   Problem: Activity: Goal: Risk for activity intolerance will decrease Outcome: Progressing   Problem: Elimination: Goal: Will not experience complications related to bowel motility Outcome: Progressing Goal: Will not experience complications related to urinary retention Outcome: Progressing   Problem: Pain Managment: Goal: General experience of comfort will improve Outcome: Progressing   Problem: Safety: Goal: Ability to remain free from injury will improve Outcome: Progressing   Problem: Skin Integrity: Goal: Risk for impaired skin integrity will decrease Outcome: Progressing

## 2020-05-02 NOTE — Telephone Encounter (Signed)
Please advise on Vit B12 injections. Thanks!

## 2020-05-03 ENCOUNTER — Ambulatory Visit: Payer: Self-pay | Admitting: *Deleted

## 2020-05-03 ENCOUNTER — Other Ambulatory Visit: Payer: Self-pay | Admitting: Family Medicine

## 2020-05-03 DIAGNOSIS — R55 Syncope and collapse: Secondary | ICD-10-CM

## 2020-05-03 LAB — INSULIN AND C-PEPTIDE, SERUM
C-Peptide: 3 ng/mL (ref 1.1–4.4)
Insulin: 8.4 u[IU]/mL (ref 2.6–24.9)

## 2020-05-03 LAB — METHYLMALONIC ACID, SERUM: Methylmalonic Acid, Quantitative: 211 nmol/L (ref 0–378)

## 2020-05-03 MED ORDER — BLOOD GLUCOSE MONITOR KIT: PACK | 0 refills | Status: DC

## 2020-05-03 NOTE — Telephone Encounter (Signed)
Copied from [place] [id number]. Topic: General - Other >> May 03, 2020  2:34 PM Pawlus, Brayton Layman A wrote: Reason for CRM: Pt requested a glucose monitor, Pt stated is was supposed to already be sent her her pharmacy. Please advise.

## 2020-05-03 NOTE — Telephone Encounter (Signed)
Patient Care Representative called and spoke to patient regarding request for glucose meter and supplies. She is very upset that these were not sent for the patient to monitor her glucose for hypoglycemia.  She states that the patient has passed out a couple time since coming home from the hospital- out of concern for patient - triage was notified and patient was called.  Spoke with patient- she has not passed out- she has a few few dizzy spells- she reports they did improve with eating. Patient lives alone and is requesting monitor to help her check her levels when she has symptoms. Advised would send message to office for PCP review and either- moving appointment up- or Rx for meters and supplies.  Reason for Disposition . [1] MILD dizziness (e.g., walking normally) AND [2] has NOT been evaluated by physician for this  (Exception: dizziness caused by heat exposure, sudden standing, or poor fluid intake)  Answer Assessment - Initial Assessment Questions 1. DESCRIPTION: "Describe your dizziness."     Felt lightheaded- she ate sugar and felt better 2. LIGHTHEADED: "Do you feel lightheaded?" (e.g., somewhat faint, woozy, weak upon standing)     Patient is feeling fine now- just ate lunch 3. VERTIGO: "Do you feel like either you or the room is spinning or tilting?" (i.e. vertigo)     no 4. SEVERITY: "How bad is it?"  "Do you feel like you are going to faint?" "Can you stand and walk?"   - MILD: Feels slightly dizzy, but walking normally.   - MODERATE: Feels very unsteady when walking, but not falling; interferes with normal activities (e.g., school, work) .   - SEVERE: Unable to walk without falling, or requires assistance to walk without falling; feels like passing out now.      mild 5. ONSET:  "When did the dizziness begin?"     Patient had first episode- low blood sugar/BP 6. AGGRAVATING FACTORS: "Does anything make it worse?" (e.g., standing, change in head position)     Mid morning 7. HEART  RATE: "Can you tell me your heart rate?" "How many beats in 15 seconds?"  (Note: not all patients can do this)      Not able to check 8. CAUSE: "What do you think is causing the dizziness?"     hypoglycemia 9. RECURRENT SYMPTOM: "Have you had dizziness before?" If Yes, ask: "When was the last time?" "What happened that time?"     No- last episode 10. OTHER SYMPTOMS: "Do you have any other symptoms?" (e.g., fever, chest pain, vomiting, diarrhea, bleeding)       no 11. PREGNANCY: "Is there any chance you are pregnant?" "When was your last menstrual period?"       n/a  Protocols used: DIZZINESS [facility]

## 2020-05-03 NOTE — Telephone Encounter (Signed)
Shawna from [facility] [place] called and wanted to know about the status of the request for the pts blood glucose supplies and monitor/ she is in need of this today/ pt received vitamin B Rx but not the other items asked for/ she stated she doesn't want pt to end up back in the hospital or using the hospital as a go to for this/ Pt has passed out already and needs meter asap / please advise  Pt was advised when she called about the meter that she can not get it until after her 4.11.22 appt / [facility] stated that two weeks is too long for her to wait for a meter / she has a new diagnoses of hypoglycemia / [facility] wants to make sure she is safe over the weekend / [facility] asked that someone please f/u with Pt about this asap /please advise  [facility]   [address] ,  [place],  [address]

## 2020-05-06 NOTE — Telephone Encounter (Signed)
FYI

## 2020-05-07 LAB — SULFONYLUREA HYPOGLYCEMICS PANEL, SERUM
Acetohexamide: NEGATIVE ug/mL (ref 20–60)
Chlorpropamide: NEGATIVE ug/mL (ref 75–250)
Glimepiride: NEGATIVE ng/mL (ref 80–250)
Glipizide: NEGATIVE ng/mL (ref 200–1000)
Glyburide: NEGATIVE ng/mL
Nateglinide: NEGATIVE ng/mL
Repaglinide: NEGATIVE ng/mL
Tolazamide: NEGATIVE ug/mL
Tolbutamide: NEGATIVE ug/mL (ref 40–100)

## 2020-05-08 MED ORDER — ACCU-CHEK SOFTCLIX LANCETS MISC: 4 refills | Status: DC

## 2020-05-08 NOTE — Addendum Note (Signed)
Addended by: Birdie Sons on: 05/08/2020 02:26 PM   Modules accepted: Orders

## 2020-05-09 LAB — BLOOD GAS, VENOUS
Acid-Base Excess: 2.6 mmol/L — ABNORMAL HIGH (ref 0.0–2.0)
Bicarbonate: 27.9 mmol/L (ref 20.0–28.0)
O2 Saturation: 47.9 %
Patient temperature: 37
pCO2, Ven: 45 mmHg (ref 44.0–60.0)
pH, Ven: 7.4 (ref 7.250–7.430)

## 2020-05-13 ENCOUNTER — Other Ambulatory Visit: Payer: Self-pay

## 2020-05-13 ENCOUNTER — Encounter: Payer: Self-pay | Admitting: Family Medicine

## 2020-05-13 ENCOUNTER — Other Ambulatory Visit: Payer: Self-pay | Admitting: Physician Assistant

## 2020-05-13 ENCOUNTER — Ambulatory Visit (INDEPENDENT_AMBULATORY_CARE_PROVIDER_SITE_OTHER): Payer: Medicaid Other | Admitting: Family Medicine

## 2020-05-13 ENCOUNTER — Other Ambulatory Visit: Payer: Self-pay | Admitting: Family Medicine

## 2020-05-13 ENCOUNTER — Telehealth: Payer: Self-pay

## 2020-05-13 VITALS — BP 110/71 | HR 78 | Temp 97.9°F | Resp 18 | Wt 146.0 lb

## 2020-05-13 DIAGNOSIS — R55 Syncope and collapse: Secondary | ICD-10-CM

## 2020-05-13 DIAGNOSIS — R300 Dysuria: Secondary | ICD-10-CM | POA: Diagnosis not present

## 2020-05-13 DIAGNOSIS — H539 Unspecified visual disturbance: Secondary | ICD-10-CM | POA: Diagnosis not present

## 2020-05-13 DIAGNOSIS — L719 Rosacea, unspecified: Secondary | ICD-10-CM

## 2020-05-13 DIAGNOSIS — N390 Urinary tract infection, site not specified: Secondary | ICD-10-CM | POA: Diagnosis not present

## 2020-05-13 DIAGNOSIS — F32A Depression, unspecified: Secondary | ICD-10-CM

## 2020-05-13 LAB — POCT URINALYSIS DIPSTICK
Bilirubin, UA: NEGATIVE
Glucose, UA: NEGATIVE
Ketones, UA: NEGATIVE
Nitrite, UA: NEGATIVE
Protein, UA: NEGATIVE
Spec Grav, UA: 1.01 (ref 1.010–1.025)
Urobilinogen, UA: 0.2 E.U./dL
pH, UA: 7.5 (ref 5.0–8.0)

## 2020-05-13 MED ORDER — CEPHALEXIN 500 MG PO CAPS: 500.0000 mg | ORAL_CAPSULE | Freq: Two times a day (BID) | ORAL | 0 refills | Status: AC

## 2020-05-13 MED ORDER — METRONIDAZOLE 1 % EX GEL: Freq: Every day | CUTANEOUS | 0 refills | Status: DC

## 2020-05-13 NOTE — Patient Instructions (Addendum)
. Please review the attached list of medications and notify my office if there are any errors.   . Please bring all of your medications to every appointment so we can make sure that our medication list is the same as yours.    Hypoglycemia Hypoglycemia occurs when the level of sugar (glucose) in the blood is too low. Hypoglycemia can happen in people who have or do not have diabetes. It can develop quickly, and it can be a medical emergency. For most people, a blood glucose level below 70 mg/dL (3.9 mmol/L) is considered hypoglycemia. Glucose is a type of sugar that provides the body's main source of energy. Certain hormones (insulin and glucagon) control the level of glucose in the blood. Insulin lowers blood glucose, and glucagon raises blood glucose. Hypoglycemia can result from having too much insulin in the bloodstream, or from not eating enough food that contains glucose. You may also have reactive hypoglycemia, which happens within 4 hours after eating a meal. What are the causes? Hypoglycemia occurs most often in people who have diabetes and may be caused by:  Diabetes medicine.  Not eating enough, or not eating often enough.  Increased physical activity.  Drinking alcohol on an empty stomach. If you do not have diabetes, hypoglycemia may be caused by:  A tumor in the pancreas.  Not eating enough, or not eating for long periods at a time (fasting).  A severe infection or illness.  Problems after having bariatric surgery.  Organ failure, such as kidney or liver failure.  Certain medicines. What increases the risk? Hypoglycemia is more likely to develop in people who:  Have diabetes and take medicines to lower blood glucose.  Abuse alcohol.  Have a severe illness. What are the signs or symptoms? Symptoms vary depending on whether the condition is mild, moderate, or severe. Mild hypoglycemia  Hunger.  Anxiety.  Sweating and feeling clammy.  Dizziness or feeling  light-headed.  Sleepiness or restless sleep.  Nausea.  Increased heart rate.  Headache.  Blurry vision.  Irritability.  Tingling or numbness around the mouth, lips, or tongue.  A change in coordination. Moderate hypoglycemia  Confusion and poor judgment.  Behavior changes.  Weakness.  Irregular heartbeat. Severe hypoglycemia Severe hypoglycemia is a medical emergency. It can cause:  Fainting.  Seizures.  Loss of consciousness (coma).  Death. How is this diagnosed? Hypoglycemia is diagnosed with a blood test to measure your blood glucose level. This blood test is done while you are having symptoms. Your health care provider may also do a physical exam and review your medical history. How is this treated? This condition can be treated by immediately eating or drinking something that contains sugar with 15 grams of rapid-acting carbohydrate, such as:  4 oz (120 mL) of fruit juice.  4-6 oz (120-150 mL) of regular soda (not diet soda).  8 oz (240 mL) of low-fat milk.  Several pieces of hard candy. Check food labels to find out how many to eat for 15 grams.  1 Tbsp (15 mL) of sugar or honey. Treating hypoglycemia if you have diabetes If you are alert and able to swallow safely, follow the 15:15 rule:  Take 15 grams of a rapid-acting carbohydrate. Talk with your health care provider about how much you should take. Options for getting 15 grams of rapid-acting carbohydrate include: ? Glucose tablets (take 4 tablets). ? Several pieces of hard candy. Check food labels to find out how many pieces to eat for 15 grams. ?  4 oz (120 mL) of fruit juice. ? 4-6 oz (120-150 mL) of regular (not diet) soda. ? 1 Tbsp (15 mL) of honey or sugar.  Check your blood glucose 15 minutes after you take the carbohydrate.  If the repeat blood glucose level is still at or below 70 mg/dL (3.9 mmol/L), take 15 grams of a carbohydrate again.  If your blood glucose level does not increase  above 70 mg/dL (3.9 mmol/L) after 3 tries, seek emergency medical care.  After your blood glucose level returns to normal, eat a meal or a snack within 1 hour.   Treating severe hypoglycemia Severe hypoglycemia is when your blood glucose level is at or below 54 mg/dL (3 mmol/L). Severe hypoglycemia is a medical emergency. Get medical help right away. If you have severe hypoglycemia and you cannot eat or drink, you will need to be given glucagon. A family member or close friend should learn how to check your blood glucose and how to give you glucagon. Ask your health care provider if you need to have an emergency glucagon kit available. Severe hypoglycemia may need to be treated in a hospital. The treatment may include getting glucose through an IV. You may also need treatment for the cause of your hypoglycemia. Follow these instructions at home: General instructions  Take over-the-counter and prescription medicines only as told by your health care provider.  Monitor your blood glucose as told by your health care provider.  If you drink alcohol: ? Limit how much you use to:  0-1 drink a day for nonpregnant women.  0-2 drinks a day for men. ? Be aware of how much alcohol is in your drink. In the [place] one drink equals one 12 oz bottle of beer (355 mL), one 5 oz glass of wine (148 mL), or one 1 oz glass of hard liquor (44 mL).  Keep all follow-up visits as told by your health care provider. This is important. If you have diabetes:  Always have a rapid-acting carbohydrate (15 grams) option with you to treat low blood glucose.  Follow your diabetes management plan as directed. Make sure you: ? Know the symptoms of hypoglycemia. It is important to treat it right away to prevent it from becoming severe. ? Check your blood glucose as often as told. Always check before and after exercise. ? Always check your blood glucose before you drive a motorized vehicle. ? Take your medicines as  told. ? Follow your meal plan. Eat on time, and do not skip meals.  Share your diabetes management plan with people in your workplace, school, and household.  Carry a medical alert card or wear medical alert jewelry.   Contact a health care provider if:  You have problems keeping your blood glucose in your target range.  You have frequent episodes of hypoglycemia. Get help right away if:  You continue to have hypoglycemia symptoms after eating or drinking something that contains 15 grams of fast-acting carbohydrate and you cannot get your blood glucose above 70 mg/dL (3.9 mmol/L) while following the 15:15 rule.  Your blood glucose is at or below 54 mg/dL (3 mmol/L).  You have a seizure.  You faint. These symptoms may represent a serious problem that is an emergency. Do not wait to see if the symptoms will go away. Get medical help right away. Call your local emergency services (911 in the [place] Do not drive yourself to the hospital. Summary  Hypoglycemia occurs when the level of sugar (glucose) in the  blood is too low.  Hypoglycemia can happen in people who have or do not have diabetes. It can develop quickly, and it can be a medical emergency.  Make sure you know the symptoms of hypoglycemia and how to treat it.  Always have a rapid-acting carbohydrate snack with you to treat low blood sugar. This information is not intended to replace advice given to you by your health care provider. Make sure you discuss any questions you have with your health care provider. Document Revised: 12/14/2018 Document Reviewed: 12/14/2018 Elsevier Patient Education  2021 [employer].

## 2020-05-13 NOTE — Progress Notes (Signed)
Established patient visit   Patient: Erin Richards   DOB: 01/16/1966   55 y.o. Female  MRN: [id number] Visit Date: 05/13/2020  Today's healthcare provider: Lelon Huh, MD   Chief Complaint  Patient presents with  . Hospitalization Follow-up   Subjective    HPI  Follow up Hospitalization  Patient was admitted to [facility] on 04/28/2020 and discharged on 05/02/2020. She was treated for syncope, hypoglycemia, hypotension,    Treatment for this included obtaining an MRI which was negative for stroke. Echocardiogram is unremarkable. Telemetry monitoring showed no significant abnormality. Cardiologist, Dr. Saralyn Pilar recommended Holter monitor as outpatient. Patient was started on Vitamin B12 supplement due to neuropathy. For hypoglycemia and hypotension patient was advised to eat frequent meals. She did have insulin level and C-peptide levels checked which were normal.  Per discharge summary, follow-up with cardiology [facility] clinic for Holter monitory and recheck B12 level in 4 weeks. She does have appt with Dr. Saralyn Pilar the first week of May.  She reports good compliance with treatment. She reports this condition is improved. She is currently wearing Zio monitory. Has had a few episodes of feeling like she is going to pass out which resolve when she drinks juice. Is working on snacking frequently throughout the day.   ----------------------------------------------------------------------------------------- -      Medications: Outpatient Medications Prior to Visit  Medication Sig  . ACCU-CHEK GUIDE test strip USE UP TO FOUR TIMES DAILY AS DIRECTED.  . Accu-Chek Softclix Lancets lancets Use to check blood sugar daily  . albuterol (PROAIR HFA) 108 (90 Base) MCG/ACT inhaler Inhale 1-2 puffs into the lungs every 6 (six) hours as needed for shortness of breath.  Marland Kitchen azelastine (ASTELIN) 0.1 % nasal spray PLACE 1 SPRAY INTO BOTH NOSTRILS 2 (TWO) TIMES DAILY.  . cetirizine (ZYRTEC) 10  MG tablet Take 1 tablet (10 mg total) by mouth daily.  . citalopram (CELEXA) 10 MG tablet TAKE 1 TABLET BY MOUTH EVERY DAY (Patient taking differently: Take 10 mg by mouth daily.)  . COMBIVENT RESPIMAT 20-100 MCG/ACT AERS respimat INHALE 1 PUFF INTO THE LUNGS EVERY 4 HOURS AS NEEDED FOR WHEEZING.  Marland Kitchen gabapentin (NEURONTIN) 300 MG capsule Take 1 capsule (300 mg total) by mouth daily.  . hyoscyamine (LEVSIN) 0.125 MG tablet TAKE ONE TABLET BY MOUTH EVERY 6 HOURS AS NEEDED FOR STOMACH CRAMPINGYS  . Multiple Vitamin (MULTIVITAMIN WITH MINERALS) TABS tablet Take 2 tablets by mouth daily.  . nortriptyline (PAMELOR) 25 MG capsule Take 1 capsule (25 mg total) by mouth at bedtime. For migraine prevention and IBS  . ondansetron (ZOFRAN-ODT) 4 MG disintegrating tablet Take 1 tablet (4 mg total) by mouth every 6 (six) hours as needed for nausea or vomiting.  . OXYGEN Inhale 2 L into the lungs at bedtime as needed.  . pantoprazole (PROTONIX) 20 MG tablet Take 20 mg by mouth daily.  . promethazine (PHENERGAN) 12.5 MG tablet Take 1 tablet (12.5 mg total) by mouth every 8 (eight) hours as needed for nausea or vomiting.  . vitamin B-12 1000 MCG tablet Take 1 tablet (1,000 mcg total) by mouth daily. (Patient taking differently: Take 3,000 mcg by mouth daily.)  . zolpidem (AMBIEN) 5 MG tablet Take 1 tablet (5 mg total) by mouth at bedtime.  Marland Kitchen ADVAIR DISKUS 250-50 MCG/DOSE AEPB INHALE 1 PUFF INTO THE LUNGS 2 TIMES DAILY. RINSE MOUTH AFTER USE. (Patient not taking: No sig reported)  . tiotropium (SPIRIVA) 18 MCG inhalation capsule Place 1 capsule (18 mcg  total) into inhaler and inhale daily. (Patient taking differently: Place 18 mcg into inhaler and inhale daily as needed (shortness of breath).)  . [DISCONTINUED] APPLE CIDER VINEGAR PO Take 1 tablet by mouth daily.   No facility-administered medications prior to visit.    Review of Systems  Constitutional: Negative for appetite change, chills, fatigue and fever.   Respiratory: Negative for chest tightness and shortness of breath.   Cardiovascular: Negative for chest pain and palpitations.  Gastrointestinal: Negative for abdominal pain, nausea and vomiting.  Neurological: Negative for dizziness and weakness.       Objective    BP 110/71 (BP Location: Left Arm, Patient Position: Sitting, Cuff Size: Normal)   Pulse 78   Temp 97.9 F (36.6 C) (Temporal)   Resp 18   Wt 146 lb (66.2 kg)   BMI 31.59 kg/m     Physical Exam   General: Appearance:    Overweight female in no acute distress  Eyes:    PERRL, conjunctiva/corneas clear, EOM's intact       Lungs:     Clear to auscultation bilaterally, respirations unlabored  Heart:    Normal heart rate. Normal rhythm.  1/6 blowing, holosystolic murmur at apex 1/6 high pitched, blowing, decrescendo, diastolic murmur at the left third intercostal space and lower sternal border  MS:   All extremities are intact.   Neurologic:   Awake, alert, oriented x 3. No apparent focal neurological           defect.         Results for orders placed or performed in visit on 05/13/20  POCT Urinalysis Dipstick  Result Value Ref Range   Color, UA yellow    Clarity, UA cloudy    Glucose, UA Negative Negative   Bilirubin, UA negative    Ketones, UA negative    Spec Grav, UA 1.010 1.010 - 1.025   Blood, UA Large (Hemolyzed)    pH, UA 7.5 5.0 - 8.0   Protein, UA Negative Negative   Urobilinogen, UA 0.2 0.2 or 1.0 E.U./dL   Nitrite, UA negative    Leukocytes, UA Large (3+) (A) Negative   Appearance     Odor      Assessment & Plan     1. Syncope, unspecified syncope type Unremarkable in patient work up. Was thought likely to be related to hypoglycemia and she has improved with dietary improvements. Additional counseling given on hypoglycemia friendly diet. Currently wearing Zio monitor and has follow up scheduled .   2. Dysuria  - Urine Culture  3. Urinary tract infection without hematuria, site  unspecified - cephALEXin (KEFLEX) 500 MG capsule; Take 1 capsule (500 mg total) by mouth 2 (two) times daily for 5 days.  Dispense: 10 capsule; Refill: 0  4. Vision changes Has not seen eye doctor in a few years.  - Ambulatory referral to Ophthalmology  5. Rosacea (cheeks) Recent onset.  - metroNIDAZOLE (METROGEL) 1 % gel; Apply topically daily.  Dispense: 45 g; Refill: 0        The entirety of the information documented in the History of Present Illness, Review of Systems and Physical Exam were personally obtained by me. Portions of this information were initially documented by the CMA and reviewed by me for thoroughness and accuracy.      Lelon Huh, MD  [facility] [phone] (phone) [phone] (fax)  [place]

## 2020-05-13 NOTE — Telephone Encounter (Signed)
Copied from [place] [id number]. Topic: Referral - Request for Referral >> May 13, 2020  1:56 PM Tessa Lerner A wrote: Has patient seen PCP for this complaint? Yes  *If NO, is insurance requiring patient see PCP for this issue before PCP can refer them?  Referral for which specialty: Opthalmology   Preferred provider/office: Patient has no preference but would like to be seen soon   Reason for referral: blurred vision

## 2020-05-14 ENCOUNTER — Other Ambulatory Visit: Payer: Self-pay | Admitting: Family Medicine

## 2020-05-14 ENCOUNTER — Telehealth: Payer: Self-pay | Admitting: Family Medicine

## 2020-05-14 DIAGNOSIS — L719 Rosacea, unspecified: Secondary | ICD-10-CM

## 2020-05-14 DIAGNOSIS — J0140 Acute pansinusitis, unspecified: Secondary | ICD-10-CM

## 2020-05-14 MED ORDER — METRONIDAZOLE 0.75 % EX GEL: 1.0000 "application " | Freq: Every day | CUTANEOUS | 0 refills | Status: DC

## 2020-05-14 NOTE — Telephone Encounter (Signed)
Pt called and stated that the Rx for metroNIDAZOLE (METROGEL) 0.75 % gel  Needs PA / pt stated if something else can be sent in asap she'd be ok with that as well. Pt stated she wanted something asap so she doesnt get more red splotches on her face / please advise

## 2020-05-15 ENCOUNTER — Encounter: Payer: Self-pay | Admitting: Family Medicine

## 2020-05-15 DIAGNOSIS — J302 Other seasonal allergic rhinitis: Secondary | ICD-10-CM

## 2020-05-15 LAB — URINE CULTURE

## 2020-05-15 NOTE — Telephone Encounter (Signed)
Patient checking on the status of PA stating the pharmacy has not heard back regarding any updates. Patient states she needs to know what to do regarding the red splotches on her face.

## 2020-05-16 MED ORDER — FLUTICASONE PROPIONATE 50 MCG/ACT NA SUSP: 2.0000 | Freq: Every day | NASAL | 5 refills | Status: DC | PRN

## 2020-05-24 MED ORDER — METRONIDAZOLE 0.75 % EX CREA: TOPICAL_CREAM | Freq: Two times a day (BID) | CUTANEOUS | 0 refills | Status: DC

## 2020-05-24 NOTE — Telephone Encounter (Signed)
Pt called asking if another rx can be sent to the pharmacy for the redness on her face.  She got a letter from insurance saying they denied the rx that Dr. Caryn Section sent.  She asked if there was another medication she cane get because the ras is not getting better.  [facility]

## 2020-05-24 NOTE — Telephone Encounter (Signed)
metronidazole is the only topical treatment. I'll send in a prescription for the cream instead of the gel and see it's covered. She may need to call her insurance since  I have no idea what is on her formulary.

## 2020-05-24 NOTE — Telephone Encounter (Signed)
Please have Dr. Fisher review. KW 

## 2020-05-25 ENCOUNTER — Other Ambulatory Visit: Payer: Self-pay | Admitting: Family Medicine

## 2020-05-25 DIAGNOSIS — G43809 Other migraine, not intractable, without status migrainosus: Secondary | ICD-10-CM

## 2020-05-25 NOTE — Telephone Encounter (Signed)
Requested Prescriptions  Pending Prescriptions Disp Refills  . nortriptyline (PAMELOR) 25 MG capsule [Pharmacy Med Name: NORTRIPTYLINE HCL 25 MG CAP] 90 capsule 3    Sig: TAKE 1 CAPSULE (25 MG TOTAL) BY MOUTH AT BEDTIME. FOR MIGRAINE PREVENTION AND IBS     Psychiatry:  Antidepressants - Heterocyclics (TCAs) Passed - 05/25/2020  2:36 PM      Passed - Completed PHQ-2 or PHQ-9 in the last 360 days      Passed - Valid encounter within last 6 months    Recent Outpatient Visits          1 week ago Syncope, unspecified syncope type   [facility] Birdie Sons, MD   2 months ago Cough   [facility] Birdie Sons, MD   3 months ago Cough   [facility] Birdie Sons, MD   6 months ago Abnormal mammogram of right breast   [facility] Birdie Sons, MD   9 months ago Depression, unspecified depression type   [facility] Birdie Sons, MD

## 2020-05-26 ENCOUNTER — Ambulatory Visit
Admission: EM | Admit: 2020-05-26 | Discharge: 2020-05-26 | Disposition: A | Discharge status: Home / Self Care | Payer: Medicaid Other | Attending: Emergency Medicine | Admitting: Emergency Medicine

## 2020-05-26 ENCOUNTER — Other Ambulatory Visit: Payer: Self-pay

## 2020-05-26 DIAGNOSIS — J0140 Acute pansinusitis, unspecified: Secondary | ICD-10-CM | POA: Diagnosis not present

## 2020-05-26 DIAGNOSIS — J069 Acute upper respiratory infection, unspecified: Secondary | ICD-10-CM

## 2020-05-26 MED ORDER — AEROCHAMBER PLUS MISC: 2 refills | Status: AC

## 2020-05-26 MED ORDER — BENZONATATE 200 MG PO CAPS: 200.0000 mg | ORAL_CAPSULE | Freq: Three times a day (TID) | ORAL | 0 refills | Status: DC | PRN

## 2020-05-26 NOTE — ED Triage Notes (Signed)
Pt c/o sinus congestion, cough and hoarseness for about a week. Pt has been taking Sudafed with no improvement. Pt denies f/n/v/d or other symptoms.

## 2020-05-26 NOTE — Discharge Instructions (Addendum)
discontinue Zyrtec, start Mucinex or Mucinex D if your cardiologist approves of the Mucinex D, saline nasal irrigation with a NeilMed sinus rinse and distilled water as often as you want, Tessalon for the cough, 400 mg of ibuprofen/500 to 1000 mg of Tylenol 3-4 times a day as needed for pain, wait-and-see prescription of Augmentin.  I would wait a day or 2 before starting it.  Use your spacer for your albuterol and Combivent inhalers.  Take 2 puffs from your albuterol inhaler every 4-6 hours as needed for coughing, wheezing, chest soreness.
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
## 2020-05-27 MED ORDER — AMOXICILLIN-POT CLAVULANATE 875-125 MG PO TABS
1.0000 | ORAL_TABLET | Freq: Two times a day (BID) | ORAL | 0 refills | Status: DC
Start: 2020-05-27 — End: 2020-09-02

## 2020-05-27 MED ORDER — METROGEL 1 % EX GEL: Freq: Every day | CUTANEOUS | 3 refills | Status: DC

## 2020-05-27 NOTE — Addendum Note (Signed)
Addended by: Lelon Huh E on: 05/27/2020 05:30 PM   Modules accepted: Orders

## 2020-05-27 NOTE — Telephone Encounter (Signed)
Patient with a sinusitis.  Accidentally omitted prescription of Augmentin at time of discharge.  E prescribing Augmentin 875/125 twice daily for 7 days to pharmacy of choice.Marland Kitchen

## 2020-05-28 ENCOUNTER — Encounter: Payer: Self-pay | Admitting: Family Medicine

## 2020-05-28 ENCOUNTER — Telehealth (INDEPENDENT_AMBULATORY_CARE_PROVIDER_SITE_OTHER): Payer: Medicaid Other | Admitting: Family Medicine

## 2020-05-28 ENCOUNTER — Other Ambulatory Visit: Payer: Self-pay

## 2020-05-28 DIAGNOSIS — J40 Bronchitis, not specified as acute or chronic: Secondary | ICD-10-CM

## 2020-05-28 MED ORDER — PREDNISONE 5 MG PO TABS: ORAL_TABLET | ORAL | 0 refills | Status: DC

## 2020-05-28 NOTE — Progress Notes (Signed)
MyChart Video Visit    Virtual Visit via Video Note   This visit type was conducted due to national recommendations for restrictions regarding the COVID-19 Pandemic (e.g. social distancing) in an effort to limit this patient's exposure and mitigate transmission in our community. This patient is at least at moderate risk for complications without adequate follow up. This format is felt to be most appropriate for this patient at this time. Physical exam was limited by quality of the video and audio technology used for the visit.   Patient location: Home Provider location: Office  I discussed the limitations of evaluation and management by telemedicine and the availability of in person appointments. The patient expressed understanding and agreed to proceed.  Patient: Erin Richards   DOB: 06/22/1965   55 y.o. Female  MRN: [id number] Visit Date: 05/28/2020  Today's healthcare provider: Vernie Murders, PA-C   Chief Complaint  Patient presents with  . Sinus Problem   I,Porsha C McClurkin,acting as a Education administrator for [employer], PA-C.,have documented all relevant documentation on the behalf of Vernie Murders, PA-C,as directed by  [employer], PA-C while in the presence of [employer], PA-C.  Subjective    Sinus Problem This is a new problem. The current episode started in the past 7 days. The problem is unchanged. There has been no fever. Her pain is at a severity of 6/10. The pain is mild. Associated symptoms include chills, congestion, coughing, headaches, a hoarse voice, sinus pressure and a sore throat. Pertinent negatives include no ear pain, shortness of breath or sneezing. Past treatments include antibiotics and oral decongestants (Zyrtec, Mucinex). The treatment provided mild relief.  Patient was seen at the urgent care on 05/26/2020 and was prescribed Augmentin, but reports it gives her a yeast infection. She was also prescribed benzonatate 200 MG capsules but reports that  the medication wasn't working.     Medications: Outpatient Medications Prior to Visit  Medication Sig  . ACCU-CHEK GUIDE test strip USE UP TO FOUR TIMES DAILY AS DIRECTED.  . Accu-Chek Softclix Lancets lancets Use to check blood sugar daily  . albuterol (PROAIR HFA) 108 (90 Base) MCG/ACT inhaler Inhale 1-2 puffs into the lungs every 6 (six) hours as needed for shortness of breath.  Marland Kitchen amoxicillin-clavulanate (AUGMENTIN) 875-125 MG tablet Take 1 tablet by mouth 2 (two) times daily. X 7 days  . azelastine (ASTELIN) 0.1 % nasal spray PLACE 1 SPRAY INTO BOTH NOSTRILS 2 (TWO) TIMES DAILY.  . benzonatate (TESSALON) 200 MG capsule Take 1 capsule (200 mg total) by mouth 3 (three) times daily as needed for cough.  . citalopram (CELEXA) 10 MG tablet TAKE 1 TABLET BY MOUTH EVERY DAY (Patient taking differently: Take 10 mg by mouth daily.)  . COMBIVENT RESPIMAT 20-100 MCG/ACT AERS respimat INHALE 1 PUFF INTO THE LUNGS EVERY 4 HOURS AS NEEDED FOR WHEEZING.  . fluticasone (FLONASE) 50 MCG/ACT nasal spray Place 2 sprays into both nostrils daily as needed for allergies or rhinitis.  Marland Kitchen gabapentin (NEURONTIN) 300 MG capsule Take 1 capsule (300 mg total) by mouth daily.  . hyoscyamine (LEVSIN) 0.125 MG tablet TAKE ONE TABLET BY MOUTH EVERY 6 HOURS AS NEEDED FOR STOMACH CRAMPINGYS  . METROGEL 1 % gel Apply topically daily.  . metroNIDAZOLE (METROGEL) 0.75 % gel Apply 1 application topically daily.  . Multiple Vitamin (MULTIVITAMIN WITH MINERALS) TABS tablet Take 2 tablets by mouth daily.  . nortriptyline (PAMELOR) 25 MG capsule TAKE 1 CAPSULE (25 MG TOTAL) BY MOUTH AT  BEDTIME. FOR MIGRAINE PREVENTION AND IBS  . ondansetron (ZOFRAN-ODT) 4 MG disintegrating tablet Take 1 tablet (4 mg total) by mouth every 6 (six) hours as needed for nausea or vomiting.  . OXYGEN Inhale 2 L into the lungs at bedtime as needed.  . pantoprazole (PROTONIX) 40 MG tablet Take 1 tablet by mouth daily.  . promethazine (PHENERGAN) 12.5 MG  tablet Take 1 tablet (12.5 mg total) by mouth every 8 (eight) hours as needed for nausea or vomiting.  Marland Kitchen Spacer/Aero-Holding Chambers (AEROCHAMBER PLUS) inhaler Use with inhaler  . vitamin B-12 1000 MCG tablet Take 1 tablet (1,000 mcg total) by mouth daily. (Patient taking differently: Take 3,000 mcg by mouth daily.)  . zolpidem (AMBIEN) 5 MG tablet Take 1 tablet (5 mg total) by mouth at bedtime.  Marland Kitchen tiotropium (SPIRIVA) 18 MCG inhalation capsule Place 1 capsule (18 mcg total) into inhaler and inhale daily. (Patient taking differently: Place 18 mcg into inhaler and inhale daily as needed (shortness of breath).)   No facility-administered medications prior to visit.    Review of Systems  Constitutional: Positive for chills. Negative for appetite change, fatigue and fever.  HENT: Positive for congestion, hoarse voice, postnasal drip, rhinorrhea, sinus pressure, sinus pain, sore throat and voice change. Negative for ear pain and sneezing.   Respiratory: Positive for cough and chest tightness. Negative for shortness of breath and wheezing.   Cardiovascular: Positive for chest pain (due to coughing per pt).  Gastrointestinal: Negative for diarrhea and vomiting.  Neurological: Positive for headaches.      Objective    There were no vitals taken for this visit.   Physical Exam:.WDWN female in no apparent distress.  Head: Normocephalic, atraumatic. Neck: Supple, NROM Respiratory: No apparent distress Psych: Normal mood and affect     Assessment & Plan     1. Bronchitis Developed congestion with fever up to 100.2 yesterday. Onset was 05-26-20. She went to an Urgent Care Clinic that stated she had been tested negative for COVID within the last weak and diagnose her as having sinusitis with bronchitis. She is taking Augmentin, Tessalon Perles and Mucinex-D the past 2 days. She does not feel she is improved yet. Will give a prednisone taper for wheezes and asked she switch the Mucinex-D to  Mucinex-DM. May need to get tested again for COVID if this does not help in 3 days. Given note to be out of work 05-26-20 to 06-01-20.  - predniSONE (DELTASONE) 5 MG tablet; Taper down by 1 tablet daily starting with 6 day 1, 5 day 2, 4 day 3, 3 day 4, 2 day 5 and 1 day 6. Divide dosage among meals and bedtime daily.  Dispense: 21 tablet; Refill: 0   No follow-ups on file.     I discussed the assessment and treatment plan with the patient. The patient was provided an opportunity to ask questions and all were answered. The patient agreed with the plan and demonstrated an understanding of the instructions.   The patient was advised to call back or seek an in-person evaluation if the symptoms worsen or if the condition fails to improve as anticipated.  I provided 20 minutes of non-face-to-face time during this encounter.  I, Abdulraheem Pineo, PA-C, have reviewed all documentation for this visit. The documentation on 05/28/20 for the exam, diagnosis, procedures, and orders are all accurate and complete.   Vernie Murders, PA-C [employer] [phone] (phone) [phone] (fax)  [place]

## 2020-05-28 NOTE — Telephone Encounter (Signed)
Received letter from patient's insurance that only brand name Metrogel is covered. Prescription for brand name Metrogel was sent to pharmacy.

## 2020-06-14 ENCOUNTER — Encounter: Payer: Self-pay | Admitting: Family Medicine

## 2020-06-14 ENCOUNTER — Telehealth: Payer: Self-pay

## 2020-06-14 DIAGNOSIS — R200 Anesthesia of skin: Secondary | ICD-10-CM

## 2020-06-14 NOTE — Telephone Encounter (Signed)
Copied from [place] [id number]. Topic: General - Other >> Jun 14, 2020 10:18 AM [name] wrote: Reason for CRM: pt states at her visit in April she told Dr Caryn Section her finger were numb and tingly.  Pt states he told her if it did not get better to let him know. Pt states the numbness and tingling is not better, and it is all the time now.  Pt wants to know what to do.

## 2020-06-17 ENCOUNTER — Telehealth: Payer: Self-pay

## 2020-06-17 NOTE — Telephone Encounter (Signed)
Copied from [place] [id number]. Topic: General - Other >> Jun 14, 2020 10:18 AM Erin Richards wrote: Reason for CRM: pt states at her visit in April she told Dr Caryn Section her finger were numb and tingly.  Pt states he told her if it did not get better to let him know. Pt states the numbness and tingling is not better, and it is all the time now.  Pt wants to know what to do. >> Jun 17, 2020  2:07 PM Erin Richards wrote: Patient called in to inform Dr Caryn Section that she have been waiting for Richards call back since Friday 06/14/20 about the numbness in her hands and fingers. Can be reached at Ph#  [phone]

## 2020-06-17 NOTE — Telephone Encounter (Signed)
Needs referral to neurology to evaluate.

## 2020-06-18 NOTE — Telephone Encounter (Signed)
Referral placed.

## 2020-06-18 NOTE — Telephone Encounter (Signed)
Patient called and given information as noted by Dr. Caryn Section on 06/17/20. Patient verbalized understanding and says she would like the referral as soon as possible, because she can't keep going through this. I asked if she has a neurologist in mind, she says she doesn't know any so whoever Dr. Caryn Section recommends is fine.

## 2020-06-20 ENCOUNTER — Other Ambulatory Visit: Payer: Self-pay | Admitting: Family Medicine

## 2020-06-20 DIAGNOSIS — J301 Allergic rhinitis due to pollen: Secondary | ICD-10-CM

## 2020-06-20 NOTE — Telephone Encounter (Signed)
Requested Prescriptions  Pending Prescriptions Disp Refills  . azelastine (ASTELIN) 0.1 % nasal spray [Pharmacy Med Name: AZELASTINE 0.1% (137 MCG) SPRY] 30 mL 0    Sig: PLACE 1 SPRAY INTO BOTH NOSTRILS 2 (TWO) TIMES DAILY.     Ear, Nose, and Throat: Nasal Preparations - Antiallergy Passed - 06/20/2020  4:29 PM      Passed - Valid encounter within last 12 months    Recent Outpatient Visits          3 weeks ago [place], PA-C   1 month ago Syncope, unspecified syncope type   [facility] Birdie Sons, MD   3 months ago Cough   [facility] Birdie Sons, MD   4 months ago Cough   [facility] Birdie Sons, MD   7 months ago Abnormal mammogram of right breast   [facility] Birdie Sons, MD

## 2020-06-23 ENCOUNTER — Other Ambulatory Visit: Payer: Self-pay | Admitting: Family Medicine

## 2020-06-23 DIAGNOSIS — F5101 Primary insomnia: Secondary | ICD-10-CM

## 2020-06-23 NOTE — Telephone Encounter (Signed)
Requested medication (s) are due for refill today: yes  Requested medication (s) are on the active medication list: yes  Last refill:  03/20/20 # 30 2 RF  Future visit scheduled: no  Notes to clinic:  med not delegated to NT to RF   Requested Prescriptions  Pending Prescriptions Disp Refills   zolpidem (AMBIEN) 5 MG tablet [Pharmacy Med Name: ZOLPIDEM TARTRATE 5 MG TABLET] 30 tablet     Sig: TAKE 1 TABLET BY MOUTH EVERYDAY AT BEDTIME      Not Delegated - Psychiatry:  Anxiolytics/Hypnotics Failed - 06/23/2020  5:09 PM      Failed - This refill cannot be delegated      Passed - Urine Drug Screen completed in last 360 days      Passed - Valid encounter within last 6 months    Recent Outpatient Visits           3 weeks ago [place], PA-C   1 month ago Syncope, unspecified syncope type   [facility] Birdie Sons, MD   3 months ago Cough   [facility] Birdie Sons, MD   4 months ago Cough   [facility] Birdie Sons, MD   7 months ago Abnormal mammogram of right breast   [facility] Birdie Sons, MD

## 2020-06-26 ENCOUNTER — Telehealth: Payer: Self-pay

## 2020-06-26 NOTE — Telephone Encounter (Signed)
I called and advised patient that our office does not have any available appointments today. Patient agrees to try [facility] for an appointment.

## 2020-06-26 NOTE — Telephone Encounter (Signed)
Copied from [place] [id number]. Topic: General - Other >> Jun 26, 2020  8:11 AM Leward Quan A wrote: Reason for CRM: Patient called in to say that she have been coughing for the past 4 days and it is productive with green mucus. Per patient her husband have been having same symptoms and was given antibiotics from his PCP and she need to know what she can do. Please call  Ph# [phone]

## 2020-07-04 ENCOUNTER — Other Ambulatory Visit: Payer: Self-pay | Admitting: Family Medicine

## 2020-07-04 DIAGNOSIS — R55 Syncope and collapse: Secondary | ICD-10-CM

## 2020-07-10 ENCOUNTER — Telehealth: Payer: Self-pay

## 2020-07-10 NOTE — Telephone Encounter (Signed)
lmtcb . Erin Richards

## 2020-07-10 NOTE — Telephone Encounter (Signed)
I called and spoke with referral coordinator from [facility]. I was advised that they will need office notes that address this problem before they will accept the referral and schedule an appointment.   I didn't see any office notes that mentioned numbness. Please advise if you are ok with me calling patient to schedule an appointment here at [facility] for evaluation of numbness so that referral can be accepted.

## 2020-07-10 NOTE — Telephone Encounter (Signed)
----- Message ----- From: Barbaraann Rondo Sent: 07/10/2020  12:49 PM EDT To: Randal Buba, CMA  Cheyenne Adas,  Im just checking for an update on this referral ?   Thanks!           . Type Date User Summary Attachment  General 07/03/2020 7:49 AM Randal Buba, CMA Auto: Referral message -  Note   ----- Message ----- From: Randal Buba, CMA Sent: 07/03/2020   7:49 AM EDT To: Barbaraann Rondo  Hello, Im checking with Dr. Caryn Section. I didn't see an office visit. Most of what I saw was documentation from phone messages.    ----- Message ----- From: Barbaraann Rondo Sent: 07/03/2020   6:50 AM EDT To: Randal Buba, CMA  Cheyenne Adas,  I am just checking to see if there are any updates on the office notes for this referral?  Thanks!           . Type Date User Summary Attachment  General 07/03/2020 6:50 AM Barbaraann Rondo Auto: Referral message -  Note   ----- Message ----- From: Barbaraann Rondo Sent: 07/03/2020   6:50 AM EDT To: Randal Buba, CMA  Cheyenne Adas,  I am just checking to see if there are any updates on the office notes for this referral?  Thanks!          . Type Date User Summary Attachment  General 06/26/2020 2:33 PM Randal Buba, CMA Auto: Referral message -  Note   ----- Message ----- From: Randal Buba, CMA Sent: 06/26/2020   2:33 PM EDT To: Birdie Sons, MD  [facility] sent this message asking if patient has been seen in our office for the numbness in her fingers. I didn't see any office notes. I only found phone messages that mentioned patient having symptoms of numbness in her fingers. Do you recall seeing patient in the office for this problem?    ----- Message ----- From: Barbaraann Rondo Sent: 06/25/2020  10:50 AM EDT To: Randal Buba, CMA  Hey Roscena  This is [place] with [facility]. I didn't see any notes in the patient's chart on the numbness in her finger. Has  patient been seen there in the office for it?           Marland Kitchen Type Date User Summary Attachment  General 06/25/2020 10:51 AM Barbaraann Rondo - -  Note   Spoke with the referring office, Triston no longer works there so a message was sent back to [employer].         . Type Date User Summary Attachment  General 06/25/2020 10:50 AM Barbaraann Rondo Auto: Referral message -  Note   ----- Message ----- From: Barbaraann Rondo Sent: 06/25/2020  10:50 AM EDT To: Randal Buba, CMA  Hey Roscena  This is [place] with [facility]. I didn't see any notes in the patient's chart on the numbness in her finger. Has patient been seen there in the office for it?          Marland Kitchen Type Date User Summary Attachment  General 06/19/2020 3:51 PM Burke Keels, Henrietta Dine Auto: Referral message -  Note   ----- Message ----- From: Anette Guarneri Sent: 06/19/2020   3:51 PM EDT To: Sylvester Harder, CMA  Hey Triston, This is Nikki with [facility]. I didn't see any notes in the patient's chart on the numbness in her finger. Has patient been seen there in the office for it?

## 2020-07-10 NOTE — Telephone Encounter (Signed)
Yes she'll need to make an appointment here, thanks.

## 2020-07-12 NOTE — Telephone Encounter (Signed)
I called patient and advised her as below. Patient states someone from our office already advised her and scheduled an appointment for her to be seen in the office on 08/23/2020, which is the soonest in-office appointment available. Patient would like to be seen sooner. We do have some 1pm virtual visits appointments available the week of June 20th. Patient wants to know if this appointment can be done as a virtual visit? Please advise.

## 2020-07-12 NOTE — Telephone Encounter (Signed)
Telephone visit for 07/23/2020 at 1pm.

## 2020-07-12 NOTE — Telephone Encounter (Signed)
A virtual appt would be fine. Thanks.

## 2020-07-23 ENCOUNTER — Telehealth: Payer: Self-pay

## 2020-07-23 ENCOUNTER — Telehealth (INDEPENDENT_AMBULATORY_CARE_PROVIDER_SITE_OTHER): Payer: Medicaid Other | Admitting: Family Medicine

## 2020-07-23 DIAGNOSIS — F5101 Primary insomnia: Secondary | ICD-10-CM

## 2020-07-23 DIAGNOSIS — M5030 Other cervical disc degeneration, unspecified cervical region: Secondary | ICD-10-CM

## 2020-07-23 DIAGNOSIS — R200 Anesthesia of skin: Secondary | ICD-10-CM | POA: Diagnosis not present

## 2020-07-23 DIAGNOSIS — R202 Paresthesia of skin: Secondary | ICD-10-CM | POA: Diagnosis not present

## 2020-07-23 MED ORDER — ZOLPIDEM TARTRATE 5 MG PO TABS: ORAL_TABLET | ORAL | 5 refills | Status: DC

## 2020-07-23 MED ORDER — NAPROXEN 500 MG PO TABS: 500.0000 mg | ORAL_TABLET | Freq: Two times a day (BID) | ORAL | 1 refills | Status: DC
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
## 2020-07-23 NOTE — Telephone Encounter (Signed)
Have placed new referral order for neurologist in [facility]

## 2020-07-23 NOTE — Telephone Encounter (Signed)
Copied from [place] [id number]. Topic: Referral - Question >> Jul 23, 2020  2:55 PM Pawlus, Brayton Layman A wrote: Reason for CRM: Pt wanted to know if her referral that she discussed today with Dr Caryn Section could be sent to a specialist in [place] instead of in [place], please advise.

## 2020-07-23 NOTE — Addendum Note (Signed)
Addended by: Birdie Sons on: 07/23/2020 04:42 PM   Modules accepted: Orders

## 2020-07-26 ENCOUNTER — Other Ambulatory Visit: Payer: Self-pay | Admitting: Family Medicine

## 2020-07-26 DIAGNOSIS — R300 Dysuria: Secondary | ICD-10-CM

## 2020-08-02 ENCOUNTER — Telehealth: Payer: Self-pay

## 2020-08-02 NOTE — Telephone Encounter (Signed)
I returned patient's call. Patient thinks she may have a yeast infection and is requesting a prescription for a yeast infection cream. I advised patient that she would need an office visit for evaluation of symptoms. I advised patient that she should go to an urgent care for treatment of symptoms since our office is now closed and doesn't reopen until 08/06/2020. Patient verbalized understanding and agrees to go to an urgent care.

## 2020-08-02 NOTE — Telephone Encounter (Signed)
Please follow up with patient referral.

## 2020-08-02 NOTE — Telephone Encounter (Signed)
Copied from [place] [phone]. Topic: General - Other >> Aug 02, 2020  8:49 AM Tessa Lerner A wrote: Reason for CRM: Patient has made contact regarding their referral for neurology  The patient would like an update on whether a new and closer neurologist has been found and shares that they will go to [place] neurology if necessary to avoid an extended wait   Please contact to further advise

## 2020-08-02 NOTE — Telephone Encounter (Signed)
Copied from [place] [phone]. Topic: General - Other >> Aug 02, 2020  8:54 AM Tessa Lerner A wrote: Reason for CRM: Patient would like to be contacted by a member of clinical staff when possible   Patient shares that they need assistance completing medication refill requests but is uncertain of the names of the medications that need to be refilled  Please contact to further advise when possible

## 2020-08-22 ENCOUNTER — Other Ambulatory Visit: Payer: Self-pay | Admitting: Family Medicine

## 2020-08-22 DIAGNOSIS — R202 Paresthesia of skin: Secondary | ICD-10-CM

## 2020-08-22 DIAGNOSIS — J4540 Moderate persistent asthma, uncomplicated: Secondary | ICD-10-CM

## 2020-08-22 DIAGNOSIS — R200 Anesthesia of skin: Secondary | ICD-10-CM

## 2020-08-22 NOTE — Telephone Encounter (Signed)
No future visit

## 2020-08-23 ENCOUNTER — Ambulatory Visit: Payer: Medicaid Other | Admitting: Family Medicine

## 2020-09-01 ENCOUNTER — Other Ambulatory Visit: Payer: Self-pay | Admitting: Family Medicine

## 2020-09-01 DIAGNOSIS — R109 Unspecified abdominal pain: Secondary | ICD-10-CM

## 2020-09-01 NOTE — Telephone Encounter (Signed)
Requested Prescriptions  Pending Prescriptions Disp Refills  . hyoscyamine (LEVSIN) 0.125 MG tablet [Pharmacy Med Name: HYOSCYAMINE SULF 0.125 MG TAB] 20 tablet 1    Sig: TAKE ONE TABLET BY MOUTH EVERY 6 HOURS AS NEEDED FOR STOMACH [place]     Gastroenterology:  Antispasmodic Agents Passed - 09/01/2020  5:30 PM      Passed - Last Heart Rate in normal range    Pulse Readings from Last 1 Encounters:  05/26/20 82         Passed - Valid encounter within last 12 months    Recent Outpatient Visits          1 month ago Numbness and tingling in both hands   [facility] Birdie Sons, MD   3 months ago [place], PA-C   3 months ago Syncope, unspecified syncope type   [facility] Birdie Sons, MD   5 months ago Cough   [facility] Birdie Sons, MD   6 months ago Cough   [facility] Birdie Sons, MD

## 2020-09-02 ENCOUNTER — Encounter: Payer: Self-pay | Admitting: Family Medicine

## 2020-09-02 ENCOUNTER — Other Ambulatory Visit: Payer: Self-pay

## 2020-09-02 ENCOUNTER — Ambulatory Visit (INDEPENDENT_AMBULATORY_CARE_PROVIDER_SITE_OTHER): Payer: Medicaid Other | Admitting: Family Medicine

## 2020-09-02 VITALS — BP 98/51 | HR 72 | Wt 144.0 lb

## 2020-09-02 DIAGNOSIS — J0140 Acute pansinusitis, unspecified: Secondary | ICD-10-CM

## 2020-09-02 DIAGNOSIS — R1032 Left lower quadrant pain: Secondary | ICD-10-CM

## 2020-09-02 LAB — POCT URINALYSIS DIPSTICK
Bilirubin, UA: NEGATIVE
Blood, UA: NEGATIVE
Glucose, UA: NEGATIVE
Ketones, UA: NEGATIVE
Nitrite, UA: NEGATIVE
Protein, UA: NEGATIVE
Spec Grav, UA: 1.01 (ref 1.010–1.025)
Urobilinogen, UA: 0.2 E.U./dL
pH, UA: 6 (ref 5.0–8.0)

## 2020-09-02 MED ORDER — AMOXICILLIN-POT CLAVULANATE 875-125 MG PO TABS: 1.0000 | ORAL_TABLET | Freq: Two times a day (BID) | ORAL | 0 refills | Status: DC

## 2020-09-02 NOTE — Progress Notes (Signed)
Established patient visit   Patient: Erin Richards   DOB: 1965-06-26   55 y.o. Female  MRN: [id number] Visit Date: 09/02/2020  Today's healthcare provider: Lelon Huh, MD   Chief Complaint  Patient presents with   Abdominal Pain    Subjective    Abdominal Pain This is a new problem. The current episode started yesterday. The problem has been unchanged. The pain is located in the LLQ. The quality of the pain is sharp and aching. The abdominal pain radiates to the back. Associated symptoms include constipation and nausea. Pertinent negatives include no diarrhea, fever, frequency, headaches or vomiting. The pain is aggravated by certain positions and movement. The pain is relieved by Nothing. Treatments tried: Hyoscyamine. The treatment provided no relief.        Medications: Outpatient Medications Prior to Visit  Medication Sig   ACCU-CHEK GUIDE test strip USE UP TO FOUR TIMES DAILY AS DIRECTED.   Accu-Chek Softclix Lancets lancets Use to check blood sugar daily   albuterol (PROAIR HFA) 108 (90 Base) MCG/ACT inhaler Inhale 1-2 puffs into the lungs every 6 (six) hours as needed for shortness of breath.   amoxicillin-clavulanate (AUGMENTIN) 875-125 MG tablet Take 1 tablet by mouth 2 (two) times daily. X 7 days   azelastine (ASTELIN) 0.1 % nasal spray PLACE 1 SPRAY INTO BOTH NOSTRILS 2 (TWO) TIMES DAILY.   citalopram (CELEXA) 10 MG tablet TAKE 1 TABLET BY MOUTH EVERY DAY (Patient taking differently: Take 10 mg by mouth daily.)   COMBIVENT RESPIMAT 20-100 MCG/ACT AERS respimat INHALE 1 PUFF INTO THE LUNGS EVERY 4 HOURS AS NEEDED FOR WHEEZING.   fluticasone (FLONASE) 50 MCG/ACT nasal spray Place 2 sprays into both nostrils daily as needed for allergies or rhinitis.   gabapentin (NEURONTIN) 300 MG capsule Take 1 capsule (300 mg total) by mouth daily.   hyoscyamine (LEVSIN) 0.125 MG tablet TAKE ONE TABLET BY MOUTH EVERY 6 HOURS AS NEEDED FOR STOMACH CRAMPINGS   METROGEL 1 % gel  Apply topically daily.   metroNIDAZOLE (METROGEL) 0.75 % gel Apply 1 application topically daily.   Multiple Vitamin (MULTIVITAMIN WITH MINERALS) TABS tablet Take 2 tablets by mouth daily.   naproxen (NAPROSYN) 500 MG tablet TAKE 1 TABLET BY MOUTH 2 TIMES DAILY WITH A MEAL.   nortriptyline (PAMELOR) 25 MG capsule TAKE 1 CAPSULE (25 MG TOTAL) BY MOUTH AT BEDTIME. FOR MIGRAINE PREVENTION AND IBS   ondansetron (ZOFRAN-ODT) 4 MG disintegrating tablet Take 1 tablet (4 mg total) by mouth every 6 (six) hours as needed for nausea or vomiting.   OXYGEN Inhale 2 L into the lungs at bedtime as needed.   pantoprazole (PROTONIX) 40 MG tablet Take 1 tablet by mouth daily.   promethazine (PHENERGAN) 12.5 MG tablet Take 1 tablet (12.5 mg total) by mouth every 8 (eight) hours as needed for nausea or vomiting.   Spacer/Aero-Holding Chambers (AEROCHAMBER PLUS) inhaler Use with inhaler   vitamin B-12 1000 MCG tablet Take 1 tablet (1,000 mcg total) by mouth daily. (Patient taking differently: Take 3,000 mcg by mouth daily.)   zolpidem (AMBIEN) 5 MG tablet TAKE 1 TABLET BY MOUTH EVERYDAY AT BEDTIME   tiotropium (SPIRIVA) 18 MCG inhalation capsule Place 1 capsule (18 mcg total) into inhaler and inhale daily. (Patient taking differently: Place 18 mcg into inhaler and inhale daily as needed (shortness of breath).)   No facility-administered medications prior to visit.    Review of Systems  Constitutional: Negative.  Negative for fever.  Respiratory: Negative.    Gastrointestinal:  Positive for abdominal pain, constipation and nausea. Negative for abdominal distention, anal bleeding, blood in stool, diarrhea, rectal pain and vomiting.  Genitourinary:  Negative for frequency.  Neurological:  Negative for dizziness, light-headedness and headaches.      Objective    BP (!) 98/51 (BP Location: Left Arm, Patient Position: Sitting, Cuff Size: Normal)   Pulse 72   Wt 144 lb (65.3 kg)   SpO2 100%   BMI 31.16 kg/m      Physical Exam  General Appearance:    Mildly obese female, alert, cooperative, in no acute distress  Eyes:    PERRL, conjunctiva/corneas clear, EOM's intact       Lungs:     Clear to auscultation bilaterally, respirations unlabored  Heart:    Normal heart rate. Normal rhythm.   Abdomen:   bowel sounds present and normal in all 4 quadrants, soft, round, or Tender LLQ, no rebound. Pain with palpation radiates to same equivalent area of back.      Results for orders placed or performed in visit on 09/02/20  POCT urinalysis dipstick  Result Value Ref Range   Color, UA     Clarity, UA     Glucose, UA Negative Negative   Bilirubin, UA Negative    Ketones, UA Negative    Spec Grav, UA 1.010 1.010 - 1.025   Blood, UA Negative    pH, UA 6.0 5.0 - 8.0   Protein, UA Negative Negative   Urobilinogen, UA 0.2 0.2 or 1.0 E.U./dL   Nitrite, UA Negative    Leukocytes, UA Small (1+) (A) Negative   Appearance     Odor      Assessment & Plan     1. Left lower quadrant abdominal pain Discussed Differential diagnosis to include urinary and GI pathology.  - Urine Culture - DG Abd 2 Views; Future  Cover for UTI and diverticulitis with  amoxicillin-clavulanate (AUGMENTIN) 875-125 MG tablet; Take 1 tablet by mouth 2 (two) times daily for 7 days. X 7 days  Dispense: 14 tablet; Refill: 0      The entirety of the information documented in the History of Present Illness, Review of Systems and Physical Exam were personally obtained by me. Portions of this information were initially documented by the CMA and reviewed by me for thoroughness and accuracy.     Lelon Huh, MD  [facility] [phone] (phone) [phone] (fax)  [place]

## 2020-09-02 NOTE — Patient Instructions (Signed)
Please review the attached list of medications and notify my office if there are any errors.   Please bring all of your medications to every appointment so we can make sure that our medication list is the same as yours.   Go to the [facility] on [facility] for abdominal Xray

## 2020-09-03 ENCOUNTER — Emergency Department
Admission: EM | Admit: 2020-09-03 | Discharge: 2020-09-03 | Disposition: A | Discharge status: Home / Self Care | Payer: Medicaid Other | Attending: Emergency Medicine | Admitting: Emergency Medicine

## 2020-09-03 ENCOUNTER — Ambulatory Visit: Payer: Self-pay

## 2020-09-03 ENCOUNTER — Ambulatory Visit
Admission: RE | Admit: 2020-09-03 | Discharge: 2020-09-03 | Disposition: A | Discharge status: Home / Self Care | Payer: Medicaid Other | Source: Ambulatory Visit | Attending: Family Medicine | Admitting: Family Medicine

## 2020-09-03 ENCOUNTER — Other Ambulatory Visit: Payer: Self-pay

## 2020-09-03 ENCOUNTER — Emergency Department: Payer: Medicaid Other

## 2020-09-03 DIAGNOSIS — R1032 Left lower quadrant pain: Secondary | ICD-10-CM

## 2020-09-03 DIAGNOSIS — K219 Gastro-esophageal reflux disease without esophagitis: Secondary | ICD-10-CM | POA: Diagnosis not present

## 2020-09-03 DIAGNOSIS — J45909 Unspecified asthma, uncomplicated: Secondary | ICD-10-CM | POA: Insufficient documentation

## 2020-09-03 DIAGNOSIS — Z7951 Long term (current) use of inhaled steroids: Secondary | ICD-10-CM | POA: Diagnosis not present

## 2020-09-03 DIAGNOSIS — Z8616 Personal history of COVID-19: Secondary | ICD-10-CM | POA: Insufficient documentation

## 2020-09-03 DIAGNOSIS — R109 Unspecified abdominal pain: Secondary | ICD-10-CM | POA: Diagnosis present

## 2020-09-03 DIAGNOSIS — N12 Tubulo-interstitial nephritis, not specified as acute or chronic: Secondary | ICD-10-CM | POA: Diagnosis not present

## 2020-09-03 LAB — CBC
HCT: 38.9 % (ref 36.0–46.0)
Hemoglobin: 13 g/dL (ref 12.0–15.0)
MCH: 27.5 pg (ref 26.0–34.0)
MCHC: 33.4 g/dL (ref 30.0–36.0)
MCV: 82.2 fL (ref 80.0–100.0)
Platelets: 171 10*3/uL (ref 150–400)
RBC: 4.73 MIL/uL (ref 3.87–5.11)
RDW: 14 % (ref 11.5–15.5)
WBC: 4.2 10*3/uL (ref 4.0–10.5)
nRBC: 0 % (ref 0.0–0.2)

## 2020-09-03 LAB — URINALYSIS, COMPLETE (UACMP) WITH MICROSCOPIC
Bacteria, UA: NONE SEEN
Bilirubin Urine: NEGATIVE
Glucose, UA: NEGATIVE mg/dL
Hgb urine dipstick: NEGATIVE
Ketones, ur: NEGATIVE mg/dL
Nitrite: NEGATIVE
Protein, ur: NEGATIVE mg/dL
Specific Gravity, Urine: 1.012 (ref 1.005–1.030)
pH: 5 (ref 5.0–8.0)

## 2020-09-03 LAB — COMPREHENSIVE METABOLIC PANEL
ALT: 11 U/L (ref 0–44)
AST: 18 U/L (ref 15–41)
Albumin: 4.2 g/dL (ref 3.5–5.0)
Alkaline Phosphatase: 67 U/L (ref 38–126)
Anion gap: 6 (ref 5–15)
BUN: 9 mg/dL (ref 6–20)
CO2: 27 mmol/L (ref 22–32)
Calcium: 9.4 mg/dL (ref 8.9–10.3)
Chloride: 108 mmol/L (ref 98–111)
Creatinine, Ser: 0.81 mg/dL (ref 0.44–1.00)
GFR, Estimated: >60 mL/min (ref >60)
Glucose, Bld: 96 mg/dL (ref 70–99)
Potassium: 4.3 mmol/L (ref 3.5–5.1)
Sodium: 141 mmol/L (ref 135–145)
Total Bilirubin: 1.2 mg/dL (ref 0.3–1.2)
Total Protein: 7.4 g/dL (ref 6.5–8.1)

## 2020-09-03 LAB — LIPASE, BLOOD: Lipase: 30 U/L (ref 11–51)

## 2020-09-03 LAB — POC URINE PREG, ED: Preg Test, Ur: NEGATIVE

## 2020-09-03 MED ORDER — ONDANSETRON 4 MG PO TBDP: 4.0000 mg | ORAL_TABLET | Freq: Three times a day (TID) | ORAL | 0 refills | Status: AC | PRN

## 2020-09-03 MED ORDER — ONDANSETRON HCL 4 MG/2ML IJ SOLN
4.0000 mg | Freq: Once | INTRAMUSCULAR | Status: AC
  Administered 2020-09-03: 4 mg via INTRAVENOUS
  Filled 2020-09-03: qty 2

## 2020-09-03 MED ORDER — ONDANSETRON 4 MG PO TBDP: 4.0000 mg | ORAL_TABLET | Freq: Three times a day (TID) | ORAL | 0 refills | Status: DC | PRN

## 2020-09-03 MED ORDER — ACETAMINOPHEN 500 MG PO TABS
1000.0000 mg | ORAL_TABLET | Freq: Once | ORAL | Status: AC
  Administered 2020-09-03: 1000 mg via ORAL
  Filled 2020-09-03: qty 2

## 2020-09-03 MED ORDER — CETIRIZINE HCL 5 MG/5ML PO SOLN
5.0000 mg | Freq: Once | ORAL | Status: DC
  Filled 2020-09-03: qty 5

## 2020-09-03 MED ORDER — SODIUM CHLORIDE 0.9 % IV SOLN
1.0000 g | Freq: Once | INTRAVENOUS | Status: AC
  Administered 2020-09-03: 1 g via INTRAVENOUS
  Filled 2020-09-03: qty 10

## 2020-09-03 MED ORDER — FENTANYL CITRATE (PF) 100 MCG/2ML IJ SOLN
50.0000 ug | Freq: Once | INTRAMUSCULAR | Status: AC
  Administered 2020-09-03: 50 ug via INTRAVENOUS
  Filled 2020-09-03: qty 2

## 2020-09-03 MED ORDER — IOHEXOL 350 MG/ML SOLN
75.0000 mL | Freq: Once | INTRAVENOUS | Status: AC | PRN
  Administered 2020-09-03: 75 mL via INTRAVENOUS
  Filled 2020-09-03: qty 75
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
## 2020-09-03 NOTE — ED Notes (Signed)
Dr Creig Hines, attending, notified of pt request for "medicine for itching".

## 2020-09-03 NOTE — Telephone Encounter (Signed)
Patient stated abdominal pain is more severe today. Pain is waxing and waning. Patient is also having bouts of nausea. Strongly advised patient to go to ED for evaluation. Patient stated that she will go the ED.

## 2020-09-03 NOTE — ED Triage Notes (Signed)
Presents with left sided abd pain   states pain started on Sunday  was seen by PCP yesterday  was told to come to ED to be evaluated.

## 2020-09-03 NOTE — ED Notes (Addendum)
Pt presents for left lower quadrant abdominal pain radiating to back. Denies trouble urinating or pain while urinating. Previous hx of IBD but reports symptoms more severe than IBD. Pt still has appendix intact. Patient reports nausea this am when eating but no vomiting/diarrhea. Was seen yesterday at PCP and rx'd Augmentin for possible UTI or kidney infection and told to return or go to ED if worsened symptoms/no improvement. Pt reports pain 8 of 10 and is guarding abdomen.

## 2020-09-03 NOTE — ED Triage Notes (Addendum)
Pt here with left side abd pain that started Sunday. Pt radiates to her back. Pt also has nausea but denies vomiting and diarrhea. Pt still has her appendix. Pt is currently on abx for an infection but unsure of what kind.

## 2020-09-03 NOTE — Telephone Encounter (Signed)
Pt stated that she is having severe constant pain and wants to see her doctor. Pt rates pain 10/10. Pt stated cannot get into a comfortable position. Pt stated the pain worse when sitting.  Pt was seen in office and given abx. Pt had no blood in urine. Pt had abdominal xrays this morning but have not been read yet. Advised to go back to ED for her sx. Pt refused stating "I want to speak to may doctor. I want him to tell me."  Called office spoke with Arbie Cookey and advised her of situation. Call transferred to office.     Reason for Disposition  [1] SEVERE pain (e.g., excruciating, scale 8-10) AND [2] present > 1 hour  Answer Assessment - Initial Assessment Questions 1. LOCATION: "Where does it hurt?" (e.g., left, right)     left 2. ONSET: "When did the pain start?"     Sunday 3. SEVERITY: "How bad is the pain?" (e.g., Scale 1-10; mild, moderate, or severe)   - MILD (1-3): doesn't interfere with normal activities    - MODERATE (4-7): interferes with normal activities or awakens from sleep    - SEVERE (8-10): excruciating pain and patient unable to do normal activities (stays in bed)       severe 4. PATTERN: "Does the pain come and go, or is it constant?"      constant 5. CAUSE: "What do you think is causing the pain?"     ? kidney 6. OTHER SYMPTOMS:  "Do you have any other symptoms?" (e.g., fever, abdominal pain, vomiting, leg weakness, burning with urination, blood in urine)     Nausea,  7. PREGNANCY:  "Is there any chance you are pregnant?" "When was your last menstrual period?"     S/p hysterectomy.  Protocols used: Flank Pain-A-AH

## 2020-09-04 ENCOUNTER — Other Ambulatory Visit: Payer: Self-pay | Admitting: Family Medicine

## 2020-09-04 ENCOUNTER — Telehealth: Payer: Self-pay

## 2020-09-04 DIAGNOSIS — J0140 Acute pansinusitis, unspecified: Secondary | ICD-10-CM

## 2020-09-04 LAB — URINE CULTURE: Culture: NO GROWTH

## 2020-09-04 NOTE — Telephone Encounter (Signed)
Pt advised.  She would like to extend her work note until Monday 09/09/2020.  Is that okay?  Thanks,   -Mickel Baas

## 2020-09-04 NOTE — Telephone Encounter (Signed)
-----   Message from Birdie Sons, MD sent at 09/04/2020  8:08 AM EDT ----- Urine culture show infection with beta hemolytic strep which should resolve with antibiotic that was prescribed. Call if not much better when finished with antibiotic.  Xray shows a lot of stool build up due to constipation. Recommend she take OTC senokot as directed on label until having normal bowel movements.

## 2020-09-05 ENCOUNTER — Ambulatory Visit: Payer: Self-pay | Admitting: *Deleted

## 2020-09-05 ENCOUNTER — Ambulatory Visit: Payer: Self-pay

## 2020-09-05 ENCOUNTER — Other Ambulatory Visit: Payer: Self-pay | Admitting: Family Medicine

## 2020-09-05 DIAGNOSIS — J0140 Acute pansinusitis, unspecified: Secondary | ICD-10-CM

## 2020-09-05 DIAGNOSIS — K5900 Constipation, unspecified: Secondary | ICD-10-CM

## 2020-09-05 NOTE — Telephone Encounter (Signed)
      Message from [place] sent at 09/05/2020  3:33 PM EDT  Patient calling back for any help with bowel issues, She can't go. Please call back       Call History   Type Contact Phone/Fax User  09/05/2020 03:33 PM EDT Phone (Incoming) [name], [name] (Self) [phone] Lemmie Evens) Camille Bal, [place]. Reports she passed a small hard stoll since last triage today. Still having discomfort. Instructed she will get a call back when provider can review. Verbalizes understanding.

## 2020-09-05 NOTE — Telephone Encounter (Signed)
Any recommendations for constipation issues?

## 2020-09-05 NOTE — Telephone Encounter (Signed)
That's fine

## 2020-09-05 NOTE — Telephone Encounter (Signed)
Per agent: "Pt stated she is not getting any relief from the medication and her stomach is really hurting. Pt stated she is having to urinate and it cause a slight stinging sensation but she is unable to have a bowel movement. Pt requests that a nurse return her call asap. Cb# [phone] "   Reached pt, states advised to take sennakot for her constipation. States has taken 4 "Sennakot gummies" last night and 2 at 8-9 this AM. States no results. "I'm straining a lot and when I pee it still stings a little." Reports "Stomach still hurting, lower and left side." States will not go back to ED. Requesting PCPs advise, additional meds. Please advise.   Reason for Disposition  [1] Caller has URGENT medicine question about med that PCP or specialist prescribed AND [2] triager unable to answer question  Answer Assessment - Initial Assessment Questions 1. NAME of MEDICATION: "What medicine are you calling about?"     Sennakot 2. QUESTION: "What is your question?" (e.g., double dose of medicine, side effect)     *No Answer* 3. PRESCRIBING HCP: "Who prescribed it?" Reason: if prescribed by specialist, call should be referred to that group.     *No Answer* 4. SYMPTOMS: "Do you have any symptoms?"     *No Answer* 5. SEVERITY: If symptoms are present, ask "Are they mild, moderate or severe?"     *No Answer* 6. PREGNANCY:  "Is there any chance that you are pregnant?" "When was your last menstrual period?"     *No Answer*  Protocols used: Medication Question Call-A-AH

## 2020-09-05 NOTE — Telephone Encounter (Signed)
Letter sent to My Chart   Thanks,   -Jahad Old  

## 2020-09-05 NOTE — Telephone Encounter (Signed)
This message has been combined with previous message

## 2020-09-05 NOTE — Telephone Encounter (Signed)
May add Surfak stool softener or Linzess 145 mg qd for chronic constipation #30 & 1 RF.

## 2020-09-05 NOTE — Telephone Encounter (Signed)
Patient calling again requesting call back

## 2020-09-06 MED ORDER — AMOXICILLIN-POT CLAVULANATE 875-125 MG PO TABS: 1.0000 | ORAL_TABLET | Freq: Two times a day (BID) | ORAL | 0 refills | Status: AC

## 2020-09-06 MED ORDER — LINACLOTIDE 145 MCG PO CAPS: 145.0000 ug | ORAL_CAPSULE | Freq: Every day | ORAL | 1 refills | Status: DC

## 2020-09-06 NOTE — Telephone Encounter (Signed)
Advised patient. Medication sent into the pharmacy.

## 2020-09-06 NOTE — Addendum Note (Signed)
Addended by: Wilburt Finlay on: 09/06/2020 05:16 PM   Modules accepted: Orders

## 2020-09-21 ENCOUNTER — Other Ambulatory Visit: Payer: Self-pay | Admitting: Family Medicine

## 2020-09-21 DIAGNOSIS — J302 Other seasonal allergic rhinitis: Secondary | ICD-10-CM

## 2020-09-21 DIAGNOSIS — J4540 Moderate persistent asthma, uncomplicated: Secondary | ICD-10-CM

## 2020-09-21 DIAGNOSIS — J301 Allergic rhinitis due to pollen: Secondary | ICD-10-CM

## 2020-09-21 NOTE — Telephone Encounter (Signed)
Requested Prescriptions  Pending Prescriptions Disp Refills  . montelukast (SINGULAIR) 10 MG tablet [Pharmacy Med Name: MONTELUKAST SOD 10 MG TABLET] 90 tablet     Sig: TAKE 1 TABLET BY MOUTH EVERYDAY AT BEDTIME     Pulmonology:  Leukotriene Inhibitors Passed - 09/21/2020  2:14 PM      Passed - Valid encounter within last 12 months    Recent Outpatient Visits          2 weeks ago Left lower quadrant abdominal pain   [place], MD   2 months ago Numbness and tingling in both hands   [facility] Birdie Sons, MD   3 months ago [place], PA-C   4 months ago Syncope, unspecified syncope type   [facility] Birdie Sons, MD   6 months ago Cough   [facility] Birdie Sons, MD             . COMBIVENT RESPIMAT 20-100 MCG/ACT AERS respimat [Pharmacy Med Name: COMBIVENT RESPIMAT 20-100 MCG] 4 each 1    Sig: INHALE 1 PUFF INTO THE LUNGS EVERY 4 HOURS AS NEEDED FOR WHEEZING.     Pulmonology:  Combination Products Passed - 09/21/2020  2:14 PM      Passed - Valid encounter within last 12 months    Recent Outpatient Visits          2 weeks ago Left lower quadrant abdominal pain   [facility] Birdie Sons, MD   2 months ago Numbness and tingling in both hands   [place], Kirstie Peri, MD   3 months ago [place], PA-C   4 months ago Syncope, unspecified syncope type   [facility] Birdie Sons, MD   6 months ago Cough   [facility] Birdie Sons, MD             . fluticasone (FLONASE) 50 MCG/ACT nasal spray [Pharmacy Med Name: FLUTICASONE PROP 50 MCG SPRAY] 48 mL 1    Sig: PLACE 2 SPRAYS INTO BOTH NOSTRILS DAILY AS NEEDED FOR ALLERGIES OR RHINITIS.     Ear, Nose, and Throat: Nasal Preparations - Corticosteroids Passed -  09/21/2020  2:14 PM      Passed - Valid encounter within last 12 months    Recent Outpatient Visits          2 weeks ago Left lower quadrant abdominal pain   [facility] Birdie Sons, MD   2 months ago Numbness and tingling in both hands   [facility] Birdie Sons, MD   3 months ago [place], PA-C   4 months ago Syncope, unspecified syncope type   [facility] Birdie Sons, MD   6 months ago Cough   [facility] Birdie Sons, MD             . ipratropium (ATROVENT) 0.03 % nasal spray [Pharmacy Med Name: IPRATROPIUM 0.03% SPRAY]      Sig: PLACE 2 SPRAYS INTO THE NOSE DAILY AS NEEDED.     Off-Protocol Failed - 09/21/2020  2:14 PM      Failed - Medication not assigned to a protocol, review manually.      Passed - Valid encounter within last 12 months    Recent Outpatient  Visits          2 weeks ago Left lower quadrant abdominal pain   [place], MD   2 months ago Numbness and tingling in both hands   [place], Kirstie Peri, MD   3 months ago [place], PA-C   4 months ago Syncope, unspecified syncope type   [facility] Birdie Sons, MD   6 months ago Cough   [facility] Birdie Sons, MD            Off-Protocol Failed - 09/21/2020  2:14 PM      Failed - Medication not assigned to a protocol, review manually.      Passed - Valid encounter within last 12 months    Recent Outpatient Visits          2 weeks ago Left lower quadrant abdominal pain   [facility] Birdie Sons, MD   2 months ago Numbness and tingling in both hands   [place], Kirstie Peri, MD   3 months ago [place], PA-C   4 months ago Syncope, unspecified  syncope type   [facility] Birdie Sons, MD   6 months ago Cough   [facility] Birdie Sons, MD

## 2020-09-27 ENCOUNTER — Other Ambulatory Visit: Payer: Self-pay | Admitting: Family Medicine

## 2020-09-27 DIAGNOSIS — L719 Rosacea, unspecified: Secondary | ICD-10-CM

## 2020-09-27 NOTE — Telephone Encounter (Signed)
Requested medications are due for refill today yes  Requested medications are on the active medication list yes, there are two rx for same med  Last refill 4/25  Last visit 05/2020  Future visit scheduled no  Notes to clinic Two rx for this med, please assess.

## 2020-10-01 ENCOUNTER — Ambulatory Visit (INDEPENDENT_AMBULATORY_CARE_PROVIDER_SITE_OTHER): Payer: Medicaid Other | Admitting: Obstetrics and Gynecology

## 2020-10-01 ENCOUNTER — Other Ambulatory Visit: Payer: Self-pay

## 2020-10-01 ENCOUNTER — Other Ambulatory Visit: Payer: Self-pay | Admitting: Family Medicine

## 2020-10-01 ENCOUNTER — Encounter: Payer: Self-pay | Admitting: Obstetrics and Gynecology

## 2020-10-01 VITALS — BP 102/68 | Ht <= 58 in | Wt 144.0 lb

## 2020-10-01 DIAGNOSIS — Z79890 Hormone replacement therapy (postmenopausal): Secondary | ICD-10-CM | POA: Diagnosis not present

## 2020-10-01 DIAGNOSIS — N390 Urinary tract infection, site not specified: Secondary | ICD-10-CM

## 2020-10-01 DIAGNOSIS — N951 Menopausal and female climacteric states: Secondary | ICD-10-CM

## 2020-10-01 DIAGNOSIS — J302 Other seasonal allergic rhinitis: Secondary | ICD-10-CM

## 2020-10-01 DIAGNOSIS — J301 Allergic rhinitis due to pollen: Secondary | ICD-10-CM

## 2020-10-01 MED ORDER — ESTRADIOL 0.5 MG PO TABS: 0.5000 mg | ORAL_TABLET | Freq: Every day | ORAL | 0 refills | Status: DC
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
## 2020-10-01 NOTE — Telephone Encounter (Signed)
Requested medications are due for refill today.  Unknown  Requested medications are on the active medications list.  no  Last refill. 11/20/2019 and 06/06/2019  Future visit scheduled.   no  Notes to clinic.  Both medications were D/C'd 05/02/2020. Please advise.

## 2020-10-01 NOTE — Patient Instructions (Signed)
I value your feedback and you entrusting us with your care. If you get a [facility] patient survey, I would appreciate you taking the time to let us know about your experience today. Thank you! ? ? ?

## 2020-10-02 ENCOUNTER — Other Ambulatory Visit: Payer: Self-pay | Admitting: Family Medicine

## 2020-10-02 DIAGNOSIS — N390 Urinary tract infection, site not specified: Secondary | ICD-10-CM | POA: Insufficient documentation

## 2020-10-02 DIAGNOSIS — Z1231 Encounter for screening mammogram for malignant neoplasm of breast: Secondary | ICD-10-CM

## 2020-10-18 ENCOUNTER — Ambulatory Visit
Admission: RE | Admit: 2020-10-18 | Discharge: 2020-10-18 | Disposition: A | Discharge status: Home / Self Care | Payer: Medicaid Other | Source: Ambulatory Visit | Attending: Family Medicine | Admitting: Family Medicine

## 2020-10-18 ENCOUNTER — Other Ambulatory Visit: Payer: Self-pay | Admitting: Family Medicine

## 2020-10-18 ENCOUNTER — Other Ambulatory Visit: Payer: Self-pay | Admitting: Obstetrics and Gynecology

## 2020-10-18 ENCOUNTER — Other Ambulatory Visit: Payer: Self-pay

## 2020-10-18 DIAGNOSIS — R0602 Shortness of breath: Secondary | ICD-10-CM

## 2020-10-18 DIAGNOSIS — N951 Menopausal and female climacteric states: Secondary | ICD-10-CM

## 2020-10-18 DIAGNOSIS — Z1231 Encounter for screening mammogram for malignant neoplasm of breast: Secondary | ICD-10-CM | POA: Diagnosis not present

## 2020-10-18 DIAGNOSIS — J301 Allergic rhinitis due to pollen: Secondary | ICD-10-CM

## 2020-10-18 DIAGNOSIS — R200 Anesthesia of skin: Secondary | ICD-10-CM

## 2020-10-18 DIAGNOSIS — R202 Paresthesia of skin: Secondary | ICD-10-CM

## 2020-10-20 ENCOUNTER — Encounter: Payer: Self-pay | Admitting: Obstetrics and Gynecology

## 2020-10-20 ENCOUNTER — Encounter: Payer: Self-pay | Admitting: Family Medicine

## 2020-10-26 ENCOUNTER — Encounter: Payer: Self-pay | Admitting: Family Medicine

## 2020-10-26 DIAGNOSIS — R55 Syncope and collapse: Secondary | ICD-10-CM

## 2020-10-30 NOTE — Addendum Note (Signed)
Addended by: Randal Buba on: 10/30/2020 04:55 PM   Modules accepted: Orders

## 2020-10-30 NOTE — Telephone Encounter (Addendum)
Dr. Caryn Section, patient does not have a diagnosis of Diabetes listed in her chart. Does she need to be checking her blood sugars?

## 2020-11-01 MED ORDER — ACCU-CHEK GUIDE W/DEVICE KIT: PACK | 0 refills | Status: DC

## 2020-11-01 MED ORDER — ACCU-CHEK GUIDE VI STRP: ORAL_STRIP | 0 refills | Status: DC

## 2020-11-01 MED ORDER — ACCU-CHEK SOFTCLIX LANCETS MISC: 4 refills | Status: DC

## 2020-11-02 ENCOUNTER — Other Ambulatory Visit: Payer: Self-pay

## 2020-11-02 ENCOUNTER — Emergency Department: Payer: Medicaid Other

## 2020-11-02 ENCOUNTER — Emergency Department
Admission: EM | Admit: 2020-11-02 | Discharge: 2020-11-02 | Disposition: A | Discharge status: Home / Self Care | Payer: Medicaid Other | Attending: Emergency Medicine | Admitting: Emergency Medicine

## 2020-11-02 DIAGNOSIS — R079 Chest pain, unspecified: Secondary | ICD-10-CM

## 2020-11-02 DIAGNOSIS — R0602 Shortness of breath: Secondary | ICD-10-CM | POA: Diagnosis not present

## 2020-11-02 DIAGNOSIS — Z8616 Personal history of COVID-19: Secondary | ICD-10-CM | POA: Insufficient documentation

## 2020-11-02 DIAGNOSIS — R0789 Other chest pain: Secondary | ICD-10-CM | POA: Diagnosis present

## 2020-11-02 LAB — CBC
HCT: 37.8 % (ref 36.0–46.0)
Hemoglobin: 12.4 g/dL (ref 12.0–15.0)
MCH: 27 pg (ref 26.0–34.0)
MCHC: 32.8 g/dL (ref 30.0–36.0)
MCV: 82.4 fL (ref 80.0–100.0)
Platelets: 166 10*3/uL (ref 150–400)
RBC: 4.59 MIL/uL (ref 3.87–5.11)
RDW: 14.2 % (ref 11.5–15.5)
WBC: 4.3 10*3/uL (ref 4.0–10.5)
nRBC: 0 % (ref 0.0–0.2)

## 2020-11-02 LAB — BASIC METABOLIC PANEL
Anion gap: 5 (ref 5–15)
BUN: 9 mg/dL (ref 6–20)
CO2: 28 mmol/L (ref 22–32)
Calcium: 9.1 mg/dL (ref 8.9–10.3)
Chloride: 106 mmol/L (ref 98–111)
Creatinine, Ser: 0.64 mg/dL (ref 0.44–1.00)
GFR, Estimated: >60 mL/min (ref >60)
Glucose, Bld: 98 mg/dL (ref 70–99)
Potassium: 3.6 mmol/L (ref 3.5–5.1)
Sodium: 139 mmol/L (ref 135–145)

## 2020-11-02 LAB — D-DIMER, QUANTITATIVE: D-Dimer, Quant: 0.37 ug/mL-FEU (ref 0.00–0.50)

## 2020-11-02 LAB — TROPONIN I (HIGH SENSITIVITY): Troponin I (High Sensitivity): <2 ng/L (ref <18)

## 2020-11-02 MED ORDER — SODIUM CHLORIDE 0.9 % IV BOLUS
1000.0000 mL | Freq: Once | INTRAVENOUS | Status: AC
  Administered 2020-11-02: 1000 mL via INTRAVENOUS

## 2020-11-02 MED ORDER — DICLOFENAC SODIUM 1 % EX GEL: 2.0000 g | Freq: Four times a day (QID) | CUTANEOUS | 0 refills | Status: DC

## 2020-11-02 MED ORDER — ONDANSETRON HCL 4 MG/2ML IJ SOLN
4.0000 mg | Freq: Once | INTRAMUSCULAR | Status: AC
  Administered 2020-11-02: 4 mg via INTRAVENOUS
  Filled 2020-11-02: qty 2

## 2020-11-02 MED ORDER — LIDOCAINE 5 % EX PTCH: 1.0000 | MEDICATED_PATCH | Freq: Two times a day (BID) | CUTANEOUS | 0 refills | Status: DC

## 2020-11-02 MED ORDER — IOHEXOL 350 MG/ML SOLN
75.0000 mL | Freq: Once | INTRAVENOUS | Status: AC | PRN
  Administered 2020-11-02: 75 mL via INTRAVENOUS

## 2020-11-02 MED ORDER — KETOROLAC TROMETHAMINE 30 MG/ML IJ SOLN
15.0000 mg | INTRAMUSCULAR | Status: AC
  Administered 2020-11-02: 15 mg via INTRAVENOUS
  Filled 2020-11-02: qty 1

## 2020-11-02 NOTE — ED Provider Notes (Signed)
Emergency Medicine Provider Triage Evaluation Note  Erin Richards, a 55 y.o. female  was evaluated in triage.  Pt complains of left central chest pain, with onset about 1 hour prior to arrival. She describes persistent sharp pain over the left sternal border.   Review of Systems  Positive: CP Negative: NVD, SOB  Physical Exam  There were no vitals taken for this visit. Gen:   Awake, no distress   Resp:  Normal effort CTA MSK:   Moves extremities without difficulty  Other:  CVS: RRR  Medical Decision Making  Medically screening exam initiated at 4:59 PM.  Appropriate orders placed.  [name] was informed that the remainder of the evaluation will be completed by another provider, this initial triage assessment does not replace that evaluation, and the importance of remaining in the ED until their evaluation is complete.  Patient with ED evaluation of left chest pain.    [name], PA-C 11/02/20 1700    [name], MD 11/02/20 [phone]

## 2020-11-02 NOTE — ED Triage Notes (Signed)
Pt comes pov with chest pain starting today about an hour ago. Pt has hx of open heart surgery. Pt states central to left sided chest pain.

## 2020-11-02 NOTE — ED Notes (Signed)
Dc PPW provided.pt assisted with dressing. Pt and family denies any questions. Pt assisted to lobby via wheelchair
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
## 2020-11-02 NOTE — Discharge Instructions (Signed)
Your EKG, lab tests, and CT scan of the chest were all normal today.  Your pain appears to be coming from the left chest wall which can be treated with topical lidocaine patch, topical Voltaren gel, and a heating pad.

## 2020-11-02 NOTE — ED Notes (Signed)
Assisted patient to the in room toilet to urinate. Patient required assistance and was very lightheaded and unsteady on her feet when standing.

## 2020-11-02 NOTE — ED Notes (Signed)
PT to CT.

## 2020-11-02 NOTE — ED Notes (Signed)
Pt reports increase in chest pain and requests medication. Notified MD of change in pain level.

## 2020-11-11 ENCOUNTER — Encounter: Payer: Self-pay | Admitting: General Surgery

## 2020-11-14 ENCOUNTER — Encounter: Payer: Self-pay | Admitting: Family Medicine

## 2020-11-14 DIAGNOSIS — F410 Panic disorder [episodic paroxysmal anxiety] without agoraphobia: Secondary | ICD-10-CM

## 2020-11-15 ENCOUNTER — Ambulatory Visit: Payer: Self-pay
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
## 2020-11-17 MED ORDER — ALPRAZOLAM 0.5 MG PO TABS: 0.2500 mg | ORAL_TABLET | Freq: Two times a day (BID) | ORAL | 2 refills | Status: AC | PRN

## 2020-11-18 ENCOUNTER — Telehealth: Payer: Self-pay

## 2020-11-18 NOTE — Telephone Encounter (Signed)
Dr. Caryn Section, have you seen any recent paperwork come through on this patient?

## 2020-11-18 NOTE — Telephone Encounter (Signed)
I called and spoke with patient. She is available for virtual visit this Friday at 1pm ( 11/22/2020). I put patient in this appointment slot. Please advise if this time is ok, or whether you prefer to see patient in office. It looks like the slot was on hold, so please advise if appointment needs to be moved.

## 2020-11-18 NOTE — Telephone Encounter (Signed)
Copied from [place] [id number]. Topic: General - Other >> Nov 18, 2020  1:11 PM Leward Quan A wrote:  Reason for CRM: [employer] called to check status of paper work that was faxed over on 11/12/20 to be completed by Dr Caryn Section  will fax again today just in case it was misplaced. Any questions please call Ann at Fax# [phone]  Ph# [phone] ext# [phone]

## 2020-11-19 ENCOUNTER — Other Ambulatory Visit: Payer: Self-pay | Admitting: Obstetrics and Gynecology

## 2020-11-19 ENCOUNTER — Other Ambulatory Visit: Payer: Self-pay | Admitting: Family Medicine

## 2020-11-19 DIAGNOSIS — J4540 Moderate persistent asthma, uncomplicated: Secondary | ICD-10-CM

## 2020-11-19 DIAGNOSIS — J301 Allergic rhinitis due to pollen: Secondary | ICD-10-CM

## 2020-11-19 DIAGNOSIS — N951 Menopausal and female climacteric states: Secondary | ICD-10-CM

## 2020-11-20 ENCOUNTER — Other Ambulatory Visit: Payer: Self-pay | Admitting: Family Medicine

## 2020-11-20 DIAGNOSIS — R0602 Shortness of breath: Secondary | ICD-10-CM

## 2020-11-20 NOTE — Telephone Encounter (Signed)
[facility] faxed refill request for the following medications:  linaclotide (LINZESS) 145 MCG CAPS capsule   Please advise.

## 2020-11-20 NOTE — Telephone Encounter (Signed)
Requested medication (s) are due for refill today:  Yes  Requested medication (s) are on the active medication list:   Yes   Future visit scheduled:   Yes in 2 days with Caryn Section   Last ordered: 10/19/2020 8.5 each, 6 refills  Returned because per pharmacy this is no longer made.   Requesting an alternative.   Requested Prescriptions  Pending Prescriptions Disp Refills   VENTOLIN HFA 108 (90 Base) MCG/ACT inhaler [Pharmacy Med Name: VENTOLIN HFA 90 MCG INHALER]  0     Pulmonology:  Beta Agonists Failed - 11/20/2020  9:01 AM      Failed - One inhaler should last at least one month. If the patient is requesting refills earlier, contact the patient to check for uncontrolled symptoms.      Passed - Valid encounter within last 12 months    Recent Outpatient Visits           2 months ago Left lower quadrant abdominal pain   [facility] Birdie Sons, MD   4 months ago Numbness and tingling in both hands   [place], Kirstie Peri, MD   5 months ago [place], PA-C   6 months ago Syncope, unspecified syncope type   [facility] Birdie Sons, MD   8 months ago Cough   [facility] Birdie Sons, MD       Future Appointments             In 2 days Fisher, Kirstie Peri, MD [facility] [place]

## 2020-11-21 NOTE — Telephone Encounter (Addendum)
Anne From [facility] called again to check on fax she sent to office  / she is faxing again /please advise

## 2020-11-21 NOTE — Telephone Encounter (Signed)
Pt has an appointment with you tomorrow.   Thanks,   -Mickel Baas

## 2020-11-22 ENCOUNTER — Telehealth: Payer: Self-pay | Admitting: Family Medicine

## 2020-11-22 ENCOUNTER — Telehealth (INDEPENDENT_AMBULATORY_CARE_PROVIDER_SITE_OTHER): Payer: Medicaid Other | Admitting: Family Medicine

## 2020-11-22 DIAGNOSIS — F410 Panic disorder [episodic paroxysmal anxiety] without agoraphobia: Secondary | ICD-10-CM | POA: Diagnosis not present

## 2020-11-22 DIAGNOSIS — F32A Depression, unspecified: Secondary | ICD-10-CM | POA: Diagnosis not present

## 2020-11-22 DIAGNOSIS — K5900 Constipation, unspecified: Secondary | ICD-10-CM

## 2020-11-22 MED ORDER — LINACLOTIDE 145 MCG PO CAPS: 145.0000 ug | ORAL_CAPSULE | Freq: Every day | ORAL | 1 refills | Status: DC

## 2020-11-22 MED ORDER — CITALOPRAM HYDROBROMIDE 20 MG PO TABS: 20.0000 mg | ORAL_TABLET | Freq: Every day | ORAL | 3 refills | Status: DC

## 2020-11-22 NOTE — Telephone Encounter (Signed)
[facility] faxed refill request for the following medications:   linaclotide (LINZESS) 145 MCG CAPS capsule   Please advise.

## 2020-11-22 NOTE — Progress Notes (Signed)
MyChart Video Visit    Virtual Visit via Video Note   This visit type was conducted due to national recommendations for restrictions regarding the COVID-19 Pandemic (e.g. social distancing) in an effort to limit this patient's exposure and mitigate transmission in our community. This patient is at least at moderate risk for complications without adequate follow up. This format is felt to be most appropriate for this patient at this time. Physical exam was limited by quality of the video and audio technology used for the visit.   Patient location: home  Provider location: [facility]  I discussed the limitations of evaluation and management by telemedicine and the availability of in person appointments. The patient expressed understanding and agreed to proceed.  Patient: Erin Richards   DOB: 06/07/1965   55 y.o. Female  MRN: [id number] Visit Date: 11/22/2020  Today's healthcare provider: Lelon Huh, MD   No chief complaint on file.  Subjective    HPI  Anxiety, Follow-up  She was last seen for anxiety. Changes made at last visit include; on alprazolam.   She reports excellent compliance with treatment. She reports excellent tolerance of treatment. She is not having side effects.   She feels her anxiety is moderate and Worse since last visit. Has been worse since her grandbaby was born in March of this year and she has been restricted seeing him.   Started working at [employer] since labor day and has started having panic attacks. Was prescribed 0.35m alprazolam last week to take as needed for panic attacks, but hasn't had to take it all. Is seeing a counselor via video visits 2-3 times per month.   Her neurologist Dr. PMelrose Nakayamatold her she has Reynaud's disease and recommended amlodipine. However she has long history of hypotension and prone to dizziness when her blood pressure drops.    Symptoms: No chest pain No difficulty concentrating  No dizziness Yes fatigue  No feelings  of losing control No insomnia  Yes irritable No palpitations  Yes panic attacks Yes racing thoughts  No shortness of breath No sweating  No tremors/shakes    GAD-7 Results No flowsheet data found.  PHQ-9 Scores PHQ9 SCORE ONLY 11/14/2019 08/22/2019 07/26/2019  PHQ-9 Total Score 0 2 14    ---------------------------------------------------------------------------------------------------    Medications: Outpatient Medications Prior to Visit  Medication Sig   Accu-Chek Softclix Lancets lancets Use to check blood sugar daily   ALPRAZolam (XANAX) 0.5 MG tablet Take 0.5-1 tablets (0.25-0.5 mg total) by mouth 2 (two) times daily as needed (pain attacks).   azelastine (ASTELIN) 0.1 % nasal spray PLACE 1 SPRAY INTO BOTH NOSTRILS 2 (TWO) TIMES DAILY.   Blood Glucose Monitoring Suppl (ACCU-CHEK GUIDE) w/Device KIT Use daily to check blood sugars   cetirizine (ZYRTEC) 10 MG tablet Take 1 tablet (10 mg total) by mouth daily. For allergies   citalopram (CELEXA) 10 MG tablet TAKE 1 TABLET BY MOUTH EVERY DAY (Patient taking differently: Take 10 mg by mouth daily.)   COMBIVENT RESPIMAT 20-100 MCG/ACT AERS respimat INHALE 1 PUFF INTO THE LUNGS EVERY 4 HOURS AS NEEDED FOR WHEEZING.   diclofenac Sodium (VOLTAREN) 1 % GEL Apply 2 g topically 4 (four) times daily.   estradiol (ESTRACE) 0.5 MG tablet Take 1 tablet (0.5 mg total) by mouth daily.   fluticasone (FLONASE) 50 MCG/ACT nasal spray PLACE 2 SPRAYS INTO BOTH NOSTRILS DAILY AS NEEDED FOR ALLERGIES OR RHINITIS.   gabapentin (NEURONTIN) 300 MG capsule TAKE 1 CAPSULE BY MOUTH EVERY DAY  glucose blood (ACCU-CHEK GUIDE) test strip USE UP TO FOUR TIMES DAILY AS DIRECTED.   hyoscyamine (LEVSIN) 0.125 MG tablet TAKE ONE TABLET BY MOUTH EVERY 6 HOURS AS NEEDED FOR STOMACH CRAMPINGS   ipratropium (ATROVENT) 0.03 % nasal spray PLACE 2 SPRAYS INTO THE NOSE DAILY AS NEEDED.   lidocaine (LIDODERM) 5 % Place 1 patch onto the skin every 12 (twelve) hours. Remove &  Discard patch within 12 hours or as directed by MD   linaclotide (LINZESS) 145 MCG CAPS capsule Take 1 capsule (145 mcg total) by mouth daily before breakfast.   METROGEL 1 % gel APPLY TO AFFECTED AREA TOPICALLY EVERY DAY   metroNIDAZOLE (METROGEL) 0.75 % gel Apply 1 application topically daily.   montelukast (SINGULAIR) 10 MG tablet TAKE 1 TABLET BY MOUTH EVERYDAY AT BEDTIME   Multiple Vitamin (MULTIVITAMIN WITH MINERALS) TABS tablet Take 2 tablets by mouth daily.   naproxen (NAPROSYN) 500 MG tablet Take 1 tablet (500 mg total) by mouth 2 (two) times daily with a meal. As needed for pain   nortriptyline (PAMELOR) 25 MG capsule TAKE 1 CAPSULE (25 MG TOTAL) BY MOUTH AT BEDTIME. FOR MIGRAINE PREVENTION AND IBS   OXYGEN Inhale 2 L into the lungs at bedtime as needed.   pantoprazole (PROTONIX) 40 MG tablet Take 1 tablet by mouth daily.   promethazine (PHENERGAN) 12.5 MG tablet Take 1 tablet (12.5 mg total) by mouth every 8 (eight) hours as needed for nausea or vomiting.   Spacer/Aero-Holding Chambers (AEROCHAMBER PLUS) inhaler Use with inhaler   tiotropium (SPIRIVA HANDIHALER) 18 MCG inhalation capsule Place 1 capsule (18 mcg total) into inhaler and inhale daily as needed (shortness of breath).   VENTOLIN HFA 108 (90 Base) MCG/ACT inhaler Inhale 2 puffs into the lungs every 6 (six) hours as needed for wheezing or shortness of breath.   vitamin B-12 1000 MCG tablet Take 1 tablet (1,000 mcg total) by mouth daily. (Patient taking differently: Take 3,000 mcg by mouth daily.)   zolpidem (AMBIEN) 5 MG tablet TAKE 1 TABLET BY MOUTH EVERYDAY AT BEDTIME   No facility-administered medications prior to visit.    Review of Systems  Constitutional:  Positive for fatigue. Negative for activity change, appetite change, chills, diaphoresis, fever and unexpected weight change.  Respiratory: Negative.    Cardiovascular: Negative.   Gastrointestinal: Negative.   Psychiatric/Behavioral:  Positive for dysphoric  mood. Negative for agitation, behavioral problems, confusion, decreased concentration, hallucinations, self-injury, sleep disturbance and suicidal ideas. The patient is nervous/anxious. The patient is not hyperactive.      Objective    There were no vitals taken for this visit.   Physical Exam   .Awake, alert, oriented x 3. In no apparent distress    Assessment & Plan     1. Depression, unspecified depression type  2. Panic disorder Worse due to her visitations with grand baby being restricted. She is to continue follow up with Benard Rink for counseling. Will increase citalopram (CELEXA) 20 MG tablet; Take 1 tablet (20 mg total) by mouth daily.  Dispense: 90 tablet; Refill: 3       I discussed the assessment and treatment plan with the patient. The patient was provided an opportunity to ask questions and all were answered. The patient agreed with the plan and demonstrated an understanding of the instructions.   The patient was advised to call back or seek an in-person evaluation if the symptoms worsen or if the condition fails to improve as anticipated.  I provided 12  minutes of non-face-to-face time during this encounter.  The entirety of the information documented in the History of Present Illness, Review of Systems and Physical Exam were personally obtained by me. Portions of this information were initially documented by the CMA and reviewed by me for thoroughness and accuracy.    Video connection was lost when less than 50% of the duration of the visit was complete, at which time the remainder of the visit was completed via audio only.   Lelon Huh, MD [facility] [phone] (phone) [phone] (fax)  [place]

## 2020-11-27 NOTE — Telephone Encounter (Addendum)
Anne with [facility] has called again to follow up on fax for pt's blood pressure cuff. Stated they just need paperwork filled out and faxed back. Please advise  Fax# [phone]

## 2020-12-05 NOTE — Telephone Encounter (Signed)
Ann with [facility] following up on request for bp machine for the pt. She is going to re fax right now if someone could be on the lookout for this order.

## 2020-12-10 NOTE — Telephone Encounter (Signed)
Ann following up on bp machine. Please fax when signed.

## 2020-12-11 ENCOUNTER — Telehealth: Payer: Self-pay

## 2020-12-11 NOTE — Telephone Encounter (Signed)
Copied from [place] [phone]. Topic: General - Other >> Dec 11, 2020 10:13 AM Yvette Rack wrote: Reason for CRM: Lelon Frohlich with [facility] stated that line 1 and line 2 of the CMN diagnosis was incomplete and they did not receive the letter for medical necessity for auto blood pressure monitor. Cb# [phone]  Ext. [phone]   fax# [phone]

## 2020-12-16 ENCOUNTER — Telehealth: Payer: Self-pay | Admitting: Family Medicine

## 2020-12-16 NOTE — Telephone Encounter (Signed)
[facility] states the CMN form was not complete for pt. To receive BP monitor. They need lines 1 and 2 completed. Also need letter of medical necessity completed. Will re-fax all forms to be completed.

## 2020-12-16 NOTE — Telephone Encounter (Signed)
Home Health Verbal Orders - Caller/Agency: Ann with [facility] Number: [phone]  x [phone]  She needs medical need for the blood pressure monitor and for lines  1 and 2 for the CMN. Completed.

## 2020-12-16 NOTE — Telephone Encounter (Signed)
I'm not sure what the request is for. Tried calling Ann back to clarify message below. Left message to call back. OK for [facility] triage to speak with Lelon Frohlich and clarify what her request is.

## 2020-12-19 NOTE — Telephone Encounter (Signed)
Lelon Frohlich said could be faxed  back with the information that they need for the blood pressure kit.

## 2020-12-19 NOTE — Telephone Encounter (Signed)
Ann with [facility] called back saying they still have not recd what they need for the blood pressure monitor  [phone]

## 2020-12-23 ENCOUNTER — Other Ambulatory Visit: Payer: Self-pay | Admitting: Obstetrics and Gynecology

## 2020-12-23 DIAGNOSIS — N951 Menopausal and female climacteric states: Secondary | ICD-10-CM

## 2020-12-23 NOTE — Telephone Encounter (Signed)
Has the paperwork been received yet?

## 2020-12-23 NOTE — Telephone Encounter (Signed)
Erin Richards has called again with [facility], this was called in last Mon.14th and not resolved. Erin Richards states they faxed ,after typing up a Medical Necessity  form and all Dr Caryn Section had to do is sign and fax back, also the line 1 & 2 on the CMN from still not complete.  Original request copied:[facility] states the CMN form was not complete for pt. To receive BP monitor. They need lines 1 and 2 completed. Also need letter of medical necessity completed. Will re-fax all forms to be completed

## 2020-12-27 ENCOUNTER — Other Ambulatory Visit: Payer: Self-pay | Admitting: Obstetrics and Gynecology

## 2020-12-27 DIAGNOSIS — N951 Menopausal and female climacteric states: Secondary | ICD-10-CM

## 2021-01-01 ENCOUNTER — Ambulatory Visit (INDEPENDENT_AMBULATORY_CARE_PROVIDER_SITE_OTHER): Payer: Medicaid Other | Admitting: Obstetrics and Gynecology

## 2021-01-01 ENCOUNTER — Encounter: Payer: Self-pay | Admitting: Obstetrics and Gynecology

## 2021-01-01 ENCOUNTER — Other Ambulatory Visit: Payer: Self-pay

## 2021-01-01 VITALS — BP 108/60 | Ht <= 58 in | Wt 144.0 lb

## 2021-01-01 DIAGNOSIS — B9689 Other specified bacterial agents as the cause of diseases classified elsewhere: Secondary | ICD-10-CM | POA: Diagnosis not present

## 2021-01-01 DIAGNOSIS — N951 Menopausal and female climacteric states: Secondary | ICD-10-CM | POA: Diagnosis not present

## 2021-01-01 DIAGNOSIS — Z79890 Hormone replacement therapy (postmenopausal): Secondary | ICD-10-CM

## 2021-01-01 DIAGNOSIS — N760 Acute vaginitis: Secondary | ICD-10-CM

## 2021-01-01 DIAGNOSIS — Z01419 Encounter for gynecological examination (general) (routine) without abnormal findings: Secondary | ICD-10-CM

## 2021-01-01 DIAGNOSIS — Z1211 Encounter for screening for malignant neoplasm of colon: Secondary | ICD-10-CM

## 2021-01-01 DIAGNOSIS — Z0000 Encounter for general adult medical examination without abnormal findings: Secondary | ICD-10-CM

## 2021-01-01 LAB — POCT WET PREP WITH KOH
Clue Cells Wet Prep HPF POC: NEGATIVE
KOH Prep POC: NEGATIVE
Trichomonas, UA: NEGATIVE
Yeast Wet Prep HPF POC: NEGATIVE

## 2021-01-01 MED ORDER — METRONIDAZOLE 0.75 % VA GEL: 1.0000 | Freq: Every day | VAGINAL | 2 refills | Status: AC

## 2021-01-01 MED ORDER — ESTRADIOL 0.5 MG PO TABS: 0.5000 mg | ORAL_TABLET | Freq: Every day | ORAL | 3 refills | Status: DC

## 2021-01-01 NOTE — Patient Instructions (Signed)
I value your feedback and you entrusting us with your care. If you get a [facility] patient survey, I would appreciate you taking the time to let us know about your experience today. Thank you! ? ? ?
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
## 2021-01-02 ENCOUNTER — Telehealth: Payer: Self-pay

## 2021-01-02 NOTE — Telephone Encounter (Signed)
Copied from [place] [id number]. Topic: General - Other >> Jan 02, 2021 12:06 PM Leward Quan A wrote: Reason for CRM: Oat with [facility] called  in to inform Dr Caryn Section that paper work faxed to [facility] on 12/06/20 have not been received for patient to get her BP monitor. Asking if everything can be refaxed please any questions please call Pat at Ph# [phone]

## 2021-01-03 NOTE — Telephone Encounter (Signed)
Ann with [employer] called back to check the status of the order sent in for blood pressure monitor.  It has been requested several time per caller  FAX # [phone]  Phone #  [phone] x [phone]

## 2021-01-06 NOTE — Telephone Encounter (Signed)
Was Faxed on 12/09/20 its in the pa media file that it ws signed and faxed

## 2021-01-08 ENCOUNTER — Telehealth: Payer: Self-pay | Admitting: Family Medicine

## 2021-01-08 NOTE — Telephone Encounter (Signed)
Ann with [employer] called back to check the status of the order sent in for blood pressure monitor.  It has been requested several time per caller   FAX # [phone]   Phone #  [phone] x [phone]

## 2021-01-08 NOTE — Telephone Encounter (Signed)
Printed off faxed order from media and faxed again.  KP

## 2021-01-10 NOTE — Telephone Encounter (Signed)
I reprinted the paperwork and added the dx of hypotension to section one on the CMN form. The form is asking whether the diagnosis is primary? Please advise.   They are also asking for a copy of an office visit note. Which office note did you want [place] to send? Someone wrote "OV Note 11-18-2020" on the previously faxed cover sheet for these forms. The office visit that correlates with that date is a Neurology visit. Please advise if this is the office note that needs to be submitted with the forms.

## 2021-01-10 NOTE — Telephone Encounter (Signed)
Yes, the neurology note from 11-18-2020 is the one to send. Dr. Melrose Nakayama is the one who started her on amlodipine and recommend she monitor her blood pressure, I never saw her for any of this. You can primary for the diagnosis.

## 2021-01-10 NOTE — Telephone Encounter (Signed)
Lelon Frohlich states the CMN was not complete in the diagnosis section, lines 1 & 2.   In addition, the LMN was was not received.  [phone] ext [phone]

## 2021-01-10 NOTE — Telephone Encounter (Signed)
Forms refaxed

## 2021-01-13 ENCOUNTER — Telehealth: Payer: Self-pay

## 2021-01-13 NOTE — Telephone Encounter (Signed)
Erin Richards stated the date needs to be 11/11/20 or before, caller stated the dates need to be changed, please advise.

## 2021-01-13 NOTE — Telephone Encounter (Signed)
Copied from [place] [id number]. Topic: General - Other >> Jan 13, 2021  9:36 AM Tessa Lerner A wrote: Reason for CRM: Webb Silversmith with [employer] has called to request a letter of medical need for patient's blood pressure monitor  Please fax to [phone]

## 2021-01-13 NOTE — Telephone Encounter (Signed)
Dr. Weber Cooks office visit note from 11/18/2020 was faxed last week to [place]. The office note included documentation of Dr. Melrose Nakayama recommending that patient check her blood pressure. I tried calling Lelon Frohlich to see if the office note could be used as documentation for the letter of medical necessity. Left message to call back. OK for [facility] triage to speak with Lelon Frohlich.

## 2021-01-14 ENCOUNTER — Other Ambulatory Visit: Payer: Self-pay | Admitting: Family Medicine

## 2021-01-14 DIAGNOSIS — R202 Paresthesia of skin: Secondary | ICD-10-CM

## 2021-01-14 DIAGNOSIS — R200 Anesthesia of skin: Secondary | ICD-10-CM

## 2021-01-15 NOTE — Telephone Encounter (Addendum)
Tried calling Erin Richards to discuss message below. Im not sure whether the date can be changed on an office note. Left message to call back. OK for [facility] to transfer call to office or have a [place] triage nurse speak with Erin Richards if Dr. Maralyn Sago clinical staff is unavailable.

## 2021-01-16 ENCOUNTER — Ambulatory Visit: Payer: Self-pay

## 2021-01-16 ENCOUNTER — Other Ambulatory Visit: Payer: Self-pay

## 2021-01-16 DIAGNOSIS — F5101 Primary insomnia: Secondary | ICD-10-CM

## 2021-01-16 MED ORDER — ZOLPIDEM TARTRATE 5 MG PO TABS: ORAL_TABLET | ORAL | 5 refills | Status: DC

## 2021-01-16 NOTE — Telephone Encounter (Signed)
Erin Richards from [employer] [facility] is trying to get more documents before 11/18/20 that support need of BP monitor or if she needed to change date on form to match that OV. I advised her that I do not see any visits previous that discuss HTN within the 6 mo period she is asking for. She states she will change over the form date to match the OV and fax it back. She was provided with the office fax number. No other questions/concerns noted.    Reason for Disposition  Health Information question, no triage required and triager able to answer question  Answer Assessment - Initial Assessment Questions 1. REASON FOR CALL or QUESTION: "What is your reason for calling today?" or "How can I best help you?" or "What question do you have that I can help answer?"     Erin Richards from [facility] is needing to know if any appts before 11/18/20 support HTN or need for BP monitor.  Protocols used: Information Only Call - No Triage-A-AH

## 2021-01-16 NOTE — Telephone Encounter (Signed)
Able to reach pt, call transferred to practice, LAura. Thank you!

## 2021-01-17 ENCOUNTER — Telehealth: Payer: Self-pay

## 2021-01-17 MED ORDER — AMBULATORY NON FORMULARY MEDICATION: 0 refills | Status: DC

## 2021-01-17 NOTE — Addendum Note (Signed)
Addended by: Birdie Sons on: 01/17/2021 02:19 PM   Modules accepted: Orders

## 2021-01-17 NOTE — Telephone Encounter (Signed)
See other telephone encounter.  The order [facility] monitor [facility] per [facility]'s [facility].   Thanks,   Mickel Baas

## 2021-01-17 NOTE — Telephone Encounter (Signed)
Copied from [place] [id number]. Topic: General - Inquiry >> Jan 17, 2021 10:40 AM Oneta Rack wrote: Reason for CRM: Genice Rouge, RN from [facility]" phone # [phone] would like PCP to send in a rx to patient pharmacy for a BP monitor. Caller would like a call back confirming completion. Caller states we do not supply supporting notes to why patient needs a BP monitor that would come from the PCP.  [id number] [place] [place] - [address]  Phone:  [phone] Fax:  [phone]

## 2021-01-17 NOTE — Telephone Encounter (Signed)
[facility] [place]

## 2021-01-20 NOTE — Telephone Encounter (Addendum)
Ann with [employer] is calling regarding the CMN with lines 1 and 2 need to be completed. If this can be completed and returned. CB- [phone] x [phone] Fax- [phone]

## 2021-01-21 ENCOUNTER — Ambulatory Visit: Payer: Self-pay | Admitting: *Deleted

## 2021-01-21 ENCOUNTER — Telehealth: Payer: Self-pay | Admitting: Family Medicine

## 2021-01-21 NOTE — Telephone Encounter (Signed)
PA approved from 01/2021 until 07/2021. Case # [id number]. Patient advised.

## 2021-01-21 NOTE — Telephone Encounter (Signed)
°  Chief Complaint: Request PA for Ambien Symptoms:  Frequency:  Pertinent Negatives: Patient denies  Disposition: [] ED /[] Urgent Care (no appt availability in office) / [] Appointment(In office/virtual)/ []  [place] [facility]/ [] Home Care/ [] Refused Recommended Disposition  Additional Notes: Patient states the pharmacy sent over PA for Rx- they have not heard back. Call sent to office for review

## 2021-01-21 NOTE — Telephone Encounter (Signed)
[facility] states [facility]'t [facility]. Please call back

## 2021-01-21 NOTE — Telephone Encounter (Signed)
Summary: zolpidem (AMBIEN) 5 MG tablet prior authorization   Patient called in stated to say that she will be out of her zolpidem (AMBIEN) 5 MG tablet tomorrow and is very upset because she stated that [facility] out to Dr Caryn Section with prior authorization a few days ago and have yet to hear anything. States that she can not be without the medication and the 15 that insurance is willing to pay for cant do anything for her.  Please advise Ph# [phone]      Answer Assessment - Initial Assessment Questions 1. NAME of MEDICATION: "What medicine are you calling about?"     PA for Ambien 2. QUESTION: "What is your question?" (e.g., double dose of medicine, side effect)     Patient states the pharmacy sent over a PA for her Rx Ambien and they have not heard back. Patient is concerned because she is going to run out of her sleep medication. 3. PRESCRIBING HCP: "Who prescribed it?" Reason: if prescribed by specialist, call should be referred to that group.     PCP  Protocols used: Medication Question Call-A-AH

## 2021-01-22 NOTE — Telephone Encounter (Signed)
Forms were faxed on 01/10/2021. I called and spoke with Lelon Frohlich. She says that she did not receive the forms. Forms have been re faxed today. I called Ann back and advised her to be on the look out for forms.

## 2021-01-22 NOTE — Telephone Encounter (Signed)
Patient disconnected during transfer of call from agent to NT. Called patient back and reviewed message from [place], [place] regarding blood pressure monitor.  [facility] and was advised that they did not receive prescription order for a blood pressure machine. [facility]ist states that they would not be able to run the blood pressure machine through insurance because it is an item that is purchased OTC.  Left message for patient to call back. OK for [facility] triage to advise.    We are still working on providing information to a company called  [employer] that can help supply a blood pressure monitor. I spoke with representative Lelon Frohlich today and she has most all required paperwork from our office. The last document was submitted today.         Patient verbalized understanding of above note.  Note       Encounter Info:        ation List     Problem List

## 2021-01-22 NOTE — Telephone Encounter (Signed)
[facility] and was advised that they did not receive prescription order for a blood pressure machine. [facility]ist states that they would not be able to run the blood pressure machine through insurance because it is an item that is purchased OTC.  Left message for patient to call back. OK for [facility] triage to advise.   We are still working on providing information to a company called  [employer] that can help supply a blood pressure monitor. I spoke with representative Lelon Frohlich today and she has most all required paperwork from our office. The last document was submitted today.

## 2021-01-23 ENCOUNTER — Telehealth: Payer: Self-pay | Admitting: Family Medicine

## 2021-01-23 NOTE — Telephone Encounter (Signed)
An with [facility] called asking for the CMN dated 11/18/20.  The MD order was faxed and they already had that.  Lines 1 and 2  CB#  [phone]  [id number]  FAX [phone]

## 2021-02-20 ENCOUNTER — Ambulatory Visit: Payer: Self-pay | Admitting: *Deleted

## 2021-02-20 NOTE — Telephone Encounter (Signed)
Per agent: "Patient called in to get an appointment with Dr Caryn Section say that the sciatic nerve in her leg is acting up again and she is in a lot of pain. No appointment available so asking for a nurse to call with some directions can be reached at Ph# [phone] "   Chief Complaint: BAck pain "Sciatica" Symptoms: Back pain, radiates down right leg to foot, 11/10 pain Frequency: Onset over weekend Pertinent Negatives: Patient denies bowel/bladder issues Disposition: [] ED /[] Urgent Care (no appt availability in office) / [x] Appointment(In office/virtual)/ []  [place] [facility]/ [] Home Care/ [] Refused Recommended Disposition /[] [place] [facility]/ []  Follow-up with PCP Additional Notes: Requesting meds be called in, advised would need appt. Called practice for consult, advised to route for review, may have availability tomorrow. Pt aware will receive call back.   Reason for Disposition  [1] SEVERE back pain (e.g., excruciating, unable to do any normal activities) AND [2] not improved 2 hours after pain medicine  Answer Assessment - Initial Assessment Questions 1. ONSET: "When did the pain begin?"      Weekend 2. LOCATION: "Where does it hurt?" (upper, mid or lower back)     Back to right into the foot 3. SEVERITY: "How bad is the pain?"  (e.g., Scale 1-10; mild, moderate, or severe)   - MILD (1-3): doesn't interfere with normal activities    - MODERATE (4-7): interferes with normal activities or awakens from sleep    - SEVERE (8-10): excruciating pain, unable to do any normal activities      11/10 4. PATTERN: "Is the pain constant?" (e.g., yes, no; constant, intermittent)      constant 5. RADIATION: "Does the pain shoot into your legs or elsewhere?"     Right leg into foot 6. CAUSE:  "What do you think is causing the back pain?"      Sciatica 7. BACK OVERUSE:  "Any recent lifting of heavy objects, strenuous work or exercise"      8. MEDICATIONS: "What have you taken so far for  the pain?" (e.g., nothing, acetaminophen, NSAIDS OTC meds  9. NEUROLOGIC SYMPTOMS: "Do you have any weakness, numbness, or problems with bowel/bladder control?"     No 10. OTHER SYMPTOMS: "Do you have any other symptoms?" (e.g., fever, abdominal pain, burning with urination, blood in urine)       None  Protocols used: Back Pain-A-AH

## 2021-02-20 NOTE — Telephone Encounter (Signed)
Dr. Caryn Section can this patient be fit in on your 11:20AM time slot tomorrow 02/21/21? There is no other available appts with any APP.

## 2021-02-20 NOTE — Telephone Encounter (Signed)
Please review for Dr. Fisher.  

## 2021-02-21 NOTE — Telephone Encounter (Signed)
Patient advised. Appointment scheduled.  

## 2021-02-21 NOTE — Telephone Encounter (Signed)
I called and spoke with patient. She says she went to an urgent care yesterday and was told the pain was coming from her sciatic nerve. Patient states the prescribed her prednisone, phenergan and Tramadol. Patient says she had an allergic reaction to Tramadol and had to go back to the Urgent care today. They gave her an injection for the allergic reaction and told her to follow up with here PCP. Patient wants to be seen Tuesday  02/25/2021 after 3pm by Dr. Caryn Section. Please advise if we can use the 3:40pm time slot for f/u from urgent care.

## 2021-02-21 NOTE — Telephone Encounter (Signed)
That's fine

## 2021-02-21 NOTE — Telephone Encounter (Signed)
Recommend she go to [facility].

## 2021-02-25 ENCOUNTER — Ambulatory Visit (INDEPENDENT_AMBULATORY_CARE_PROVIDER_SITE_OTHER): Payer: Medicaid Other | Admitting: Family Medicine

## 2021-02-25 ENCOUNTER — Other Ambulatory Visit: Payer: Self-pay | Admitting: Family Medicine

## 2021-02-25 ENCOUNTER — Encounter: Payer: Self-pay | Admitting: Family Medicine

## 2021-02-25 ENCOUNTER — Ambulatory Visit
Admission: RE | Admit: 2021-02-25 | Discharge: 2021-02-25 | Disposition: A | Discharge status: Home / Self Care | Payer: Medicaid Other | Attending: Family Medicine | Admitting: Family Medicine

## 2021-02-25 ENCOUNTER — Ambulatory Visit
Admission: RE | Admit: 2021-02-25 | Discharge: 2021-02-25 | Disposition: A | Discharge status: Home / Self Care | Payer: Medicaid Other | Source: Ambulatory Visit | Attending: Family Medicine | Admitting: Family Medicine

## 2021-02-25 ENCOUNTER — Other Ambulatory Visit: Payer: Self-pay

## 2021-02-25 VITALS — BP 114/57 | HR 67 | Temp 97.5°F | Ht <= 58 in | Wt 147.8 lb

## 2021-02-25 DIAGNOSIS — M5431 Sciatica, right side: Secondary | ICD-10-CM

## 2021-02-25 DIAGNOSIS — J301 Allergic rhinitis due to pollen: Secondary | ICD-10-CM

## 2021-02-25 MED ORDER — PREDNISONE 10 MG PO TABS: ORAL_TABLET | ORAL | 0 refills | Status: DC

## 2021-02-25 NOTE — Patient Instructions (Signed)
Please review the attached list of medications and notify my office if there are any errors.    

## 2021-02-25 NOTE — Progress Notes (Signed)
I,[name],acting as a Education administrator for Lelon Huh, MD.,have documented all relevant documentation on the behalf of Lelon Huh, MD,as directed by  Lelon Huh, MD while in the presence of Lelon Huh, MD.  Established patient visit   Patient: Erin Richards   DOB: 11/24/1965   56 y.o. Female  MRN: [id number] Visit Date: 02/25/2021  Today's healthcare provider: Lelon Huh, MD   No chief complaint on file.  Subjective    HPI  Follow up Urgent Care  Patient was admitted to [facility] on 02/20/21 and discharged on 02/20/21. She was treated for right sided sciatica nerve. Treatment for this included prescriptions for prednisone, tramadol and Zofran. Telephone follow up was done on n/a She reports excellent compliance with treatment. She reports this condition is  improving but not 100% . Patient states she still has pain when walking and would rate it a 6/10. Also states that when sitting for periods of time it hurts. She did return to work yesterday and was unable to be comfortable once trying to rest for the night due to pain in right hip. Pain when pushing down. Had allergic reaction to tramadol but zofran helped with nausea  ----------------------------------------------------------------------------------------- -   Medications: Outpatient Medications Prior to Visit  Medication Sig   Accu-Chek Softclix Lancets lancets Use to check blood sugar daily   ALPRAZolam (XANAX) 0.5 MG tablet Take 0.5-1 tablets (0.25-0.5 mg total) by mouth 2 (two) times daily as needed (pain attacks).   AMBULATORY NON FORMULARY MEDICATION Automatic brachial blood pressure monitor.   Dx: Labile blood pressure, dizziness   Azelastine HCl 137 MCG/SPRAY SOLN PLACE 1 SPRAY INTO BOTH NOSTRILS 2 (TWO) TIMES DAILY   Blood Glucose Monitoring Suppl (ACCU-CHEK GUIDE) w/Device KIT Use daily to check blood sugars   cetirizine (ZYRTEC) 10 MG tablet Take 1 tablet (10 mg total) by mouth daily.  For allergies   citalopram (CELEXA) 10 MG tablet citalopram 10 mg tablet  TAKE 1 TABLET BY MOUTH EVERY DAY   COMBIVENT RESPIMAT 20-100 MCG/ACT AERS respimat INHALE 1 PUFF INTO THE LUNGS EVERY 4 HOURS AS NEEDED FOR WHEEZING.   estradiol (ESTRACE) 0.5 MG tablet Take 1 tablet (0.5 mg total) by mouth daily.   fluticasone (FLONASE) 50 MCG/ACT nasal spray PLACE 2 SPRAYS INTO BOTH NOSTRILS DAILY AS NEEDED FOR ALLERGIES OR RHINITIS.   glucose blood (ACCU-CHEK GUIDE) test strip USE UP TO FOUR TIMES DAILY AS DIRECTED.   hyoscyamine (LEVSIN) 0.125 MG tablet TAKE ONE TABLET BY MOUTH EVERY 6 HOURS AS NEEDED FOR STOMACH CRAMPINGS   ipratropium (ATROVENT) 0.03 % nasal spray PLACE 2 SPRAYS INTO THE NOSE DAILY AS NEEDED.   linaclotide (LINZESS) 145 MCG CAPS capsule Take 1 capsule (145 mcg total) by mouth daily before breakfast.   montelukast (SINGULAIR) 10 MG tablet TAKE 1 TABLET BY MOUTH EVERYDAY AT BEDTIME   Multiple Vitamin (MULTIVITAMIN WITH MINERALS) TABS tablet Take 2 tablets by mouth daily.   naproxen (NAPROSYN) 500 MG tablet TAKE 1 TABLET (500 MG TOTAL) BY MOUTH 2 (TWO) TIMES DAILY WITH A MEAL. AS NEEDED FOR PAIN   nortriptyline (PAMELOR) 50 MG capsule Take 50 mg by mouth at bedtime.   ondansetron (ZOFRAN-ODT) 4 MG disintegrating tablet ondansetron 4 mg disintegrating tablet  PLEASE SEE ATTACHED FOR DETAILED DIRECTIONS   OXYGEN Inhale 2 L into the lungs at bedtime as needed.   pantoprazole (PROTONIX) 40 MG tablet Take 1 tablet by mouth daily.   promethazine (PHENERGAN) 12.5 MG tablet Take 1  tablet (12.5 mg total) by mouth every 8 (eight) hours as needed for nausea or vomiting.   Spacer/Aero-Holding Chambers (AEROCHAMBER PLUS) inhaler Use with inhaler   VENTOLIN HFA 108 (90 Base) MCG/ACT inhaler Inhale 2 puffs into the lungs every 6 (six) hours as needed for wheezing or shortness of breath.   zolpidem (AMBIEN) 5 MG tablet TAKE 1 TABLET BY MOUTH EVERYDAY AT BEDTIME   No facility-administered medications  prior to visit.         Objective    BP (!) 114/57 (BP Location: Right Arm, Patient Position: Sitting, Cuff Size: Normal)    Pulse 67    Temp (!) 97.5 F (36.4 C) (Oral)    Ht '4\' 9"'  (1.448 m)    Wt 147 lb 12.8 oz (67 kg)    SpO2 98%    BMI 31.98 kg/m  BP Readings from Last 3 Encounters:  01/01/21 108/60  11/02/20 99/60  10/01/20 102/68   Wt Readings from Last 3 Encounters:  01/01/21 144 lb (65.3 kg)  11/02/20 140 lb (63.5 kg)  10/01/20 144 lb (65.3 kg)      Physical Exam   General: Appearance:    Mildly obese female in no acute distress  Eyes:    PERRL, conjunctiva/corneas clear, EOM's intact       Lungs:     Clear to auscultation bilaterally, respirations unlabored  MS:   Tender left paraspinous muscles. No gross deformities.           Assessment & Plan     1. Sciatica of right side First episode.  - DG Lumbar Spine Complete; Future  Has one day left of 6 day prednisone course. About 50% better, extend taper additional 10 days.  - predniSONE (DELTASONE) 10 MG tablet; Take 2 tablets daily for 5 days, then 1 tablet daily for 5 days  Dispense: 15 tablet; Refill: 0    She declined flu vaccine due to many other allergies.      The entirety of the information documented in the History of Present Illness, Review of Systems and Physical Exam were personally obtained by me. Portions of this information were initially documented by the CMA and reviewed by me for thoroughness and accuracy.     Lelon Huh, MD  [facility] [phone] (phone) [phone] (fax)  [place]

## 2021-02-26 ENCOUNTER — Ambulatory Visit: Payer: Self-pay

## 2021-02-26 NOTE — Telephone Encounter (Signed)
Patient advised of Dr. Maralyn Sago message below. Patient would like to try the 60mg  prednisone for a few days. Does a new prescription need to be sent into the pharmacy?  [facility]

## 2021-02-26 NOTE — Telephone Encounter (Signed)
We could go back up to 60mg  prednisone for a few days. Still waiting for xray report. Only other option is to go to [facility].

## 2021-02-26 NOTE — Telephone Encounter (Signed)
Patient inquiring about imaging results and states back pain is not improving but getting worst. 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 Asking for something for pain. Please advise pt.   Answer Assessment - Initial Assessment Questions 1. ONSET: "When did the pain begin?"      1 week ago 2. LOCATION: "Where does it hurt?" (upper, mid or lower back)     Right back and down leg 3. SEVERITY: "How bad is the pain?"  (e.g., Scale 1-10; mild, moderate, or severe)   - MILD (1-3): doesn't interfere with normal activities    - MODERATE (4-7): interferes with normal activities or awakens from sleep    - SEVERE (8-10): excruciating pain, unable to do any normal activities      8 4. PATTERN: "Is the pain constant?" (e.g., yes, no; constant, intermittent)      Constant 5. RADIATION: "Does the pain shoot into your legs or elsewhere?"     Down leg 6. CAUSE:  "What do you think is causing the back pain?"      Sciatica  7. BACK OVERUSE:  "Any recent lifting of heavy objects, strenuous work or exercise?"     No 8. MEDICATIONS: "What have you taken so far for the pain?" (e.g., nothing, acetaminophen, NSAIDS)     Tylenol, Prednisone 9. NEUROLOGIC SYMPTOMS: "Do you have any weakness, numbness, or problems with bowel/bladder control?"     No 10. OTHER SYMPTOMS: "Do you have any other symptoms?" (e.g., fever, abdominal pain, burning with urination, blood in urine)       No 11. PREGNANCY: "Is there any chance you are pregnant?" (e.g., yes, no; LMP)       No  Protocols used: Back Pain-A-AH

## 2021-02-27 ENCOUNTER — Ambulatory Visit: Payer: Self-pay | Admitting: *Deleted

## 2021-02-27 ENCOUNTER — Ambulatory Visit: Payer: Self-pay

## 2021-02-27 ENCOUNTER — Encounter: Payer: Self-pay | Admitting: Family Medicine

## 2021-02-27 MED ORDER — PREDNISONE 10 MG PO TABS: ORAL_TABLET | ORAL | 0 refills | Status: AC

## 2021-02-27 NOTE — Telephone Encounter (Signed)
Patient advised by [facility] triage nurse. See imaging result note.

## 2021-02-27 NOTE — Telephone Encounter (Signed)
Patient advised by [facility] triage nurse in separate phone message.

## 2021-02-27 NOTE — Addendum Note (Signed)
Addended by: Birdie Sons on: 02/27/2021 08:19 AM   Modules accepted: Orders

## 2021-02-27 NOTE — Telephone Encounter (Signed)
° °  Chief Complaint: Left hand is swollen, painful Symptoms: Can use hand Frequency: This morning Pertinent Negatives: Patient denies numbness Disposition: [] ED /[x] Urgent Care (no appt availability in office) / [] Appointment(In office/virtual)/ []  [place] [facility]/ [] Home Care/ [] Refused Recommended Disposition /[] [place] [facility]/ []  Follow-up with PCP Additional Notes:    Reason for Disposition  [1] Looks infected (spreading redness, pus) AND [2] large red area (> 2 in. or 5 cm)  Answer Assessment - Initial Assessment Questions 1. ONSET: "When did the pain start?"     This morning 2. LOCATION: "Where is the pain located?"     Left hand 3. PAIN: "How bad is the pain?" (Scale 1-10; or mild, moderate, severe)   - MILD (1-3): doesn't interfere with normal activities   - MODERATE (4-7): interferes with normal activities (e.g., work or school) or awakens from sleep   - SEVERE (8-10): excruciating pain, unable to use hand at all     5-6 4. WORK OR EXERCISE: "Has there been any recent work or exercise that involved this part (i.e., hand or wrist) of the body?"     No 5. CAUSE: "What do you think is causing the pain?"     Unsure 6. AGGRAVATING FACTORS: "What makes the pain worse?" (e.g., using computer)     No 7. OTHER SYMPTOMS: "Do you have any other symptoms?" (e.g., neck pain, swelling, rash, numbness, fever)     Light purple 8. PREGNANCY: "Is there any chance you are pregnant?" "When was your last menstrual period?"     No  Protocols used: Hand and Wrist Pain-A-AH

## 2021-02-27 NOTE — Telephone Encounter (Signed)
Go ahead and take 6 tablets for 2 days which will use up most of the prescription that I sent in on Tuesday. I'll send in another prescription to taper down from there.

## 2021-02-27 NOTE — Telephone Encounter (Signed)
Summary: pt states script wrong   Outpatient Medication Detail   Disp Refills Start End  predniSONE (DELTASONE) 10 MG tablet 15 tablet 0 02/25/2021  Sig: Take 2 tablets daily for 5 days, then 1 tablet daily for 5 days  Sent to pharmacy as: predniSONE (DELTASONE) 10 MG tablet  E-Prescribing Status: Receipt confirmed by pharmacy (02/25/2021  4:13 PM EST)   This pt has called, [facility] and will not refill this. Pt says this is wrong and she was supposed to be starting a taper prednisone. So stating she thinks script is wrong. Pt wants to talk to a nurse re, please advise [phone]     Call to patient- advised per PCP comments on triage from yesterday- will let patient know X ray result as soon as provider reviews and comments.  Go ahead and take 6 tablets for 2 days which will use up most of the prescription that I sent in on Tuesday. I'll send in another prescription to taper down from there. Reason for Disposition  [1] Follow-up call to recent contact AND [2] information only call, no triage required  Answer Assessment - Initial Assessment Questions 1. REASON FOR CALL or QUESTION: "What is your reason for calling today?" or "How can I best help you?" or "What question do you have that I can help answer?"     See note  Protocols used: Information Only Call - No Triage-A-AH

## 2021-03-24 ENCOUNTER — Other Ambulatory Visit (INDEPENDENT_AMBULATORY_CARE_PROVIDER_SITE_OTHER): Payer: Self-pay | Admitting: Nurse Practitioner

## 2021-03-24 DIAGNOSIS — I739 Peripheral vascular disease, unspecified: Secondary | ICD-10-CM

## 2021-03-25 ENCOUNTER — Ambulatory Visit (INDEPENDENT_AMBULATORY_CARE_PROVIDER_SITE_OTHER): Payer: Medicaid Other

## 2021-03-25 ENCOUNTER — Other Ambulatory Visit: Payer: Self-pay

## 2021-03-25 ENCOUNTER — Ambulatory Visit (INDEPENDENT_AMBULATORY_CARE_PROVIDER_SITE_OTHER): Payer: Medicaid Other | Admitting: Vascular Surgery

## 2021-03-25 ENCOUNTER — Encounter (INDEPENDENT_AMBULATORY_CARE_PROVIDER_SITE_OTHER): Payer: Self-pay | Admitting: Vascular Surgery

## 2021-03-25 ENCOUNTER — Other Ambulatory Visit (INDEPENDENT_AMBULATORY_CARE_PROVIDER_SITE_OTHER): Payer: Self-pay | Admitting: Nurse Practitioner

## 2021-03-25 VITALS — BP 111/69 | HR 89 | Ht <= 58 in | Wt 142.0 lb

## 2021-03-25 DIAGNOSIS — I739 Peripheral vascular disease, unspecified: Secondary | ICD-10-CM

## 2021-03-25 DIAGNOSIS — R230 Cyanosis: Secondary | ICD-10-CM

## 2021-03-25 DIAGNOSIS — M5030 Other cervical disc degeneration, unspecified cervical region: Secondary | ICD-10-CM | POA: Diagnosis not present

## 2021-03-25 DIAGNOSIS — I959 Hypotension, unspecified: Secondary | ICD-10-CM

## 2021-03-25 DIAGNOSIS — I7121 Aneurysm of the ascending aorta, without rupture: Secondary | ICD-10-CM | POA: Diagnosis not present
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
## 2021-03-25 NOTE — Assessment & Plan Note (Signed)
Small and borderline to be called an aneurysm at last check for [place].  Now following with CT surgery.

## 2021-03-25 NOTE — Assessment & Plan Note (Signed)
Noninvasive studies were performed today showing normal triphasic waveforms throughout both upper extremities with indices of greater than 1 bilaterally and normal pulsatile waveforms consistent with no arterial insufficiency.   There may be a component of Raynaud's phenomenon after her trauma and I also think there is a component of reflex sympathetic dystrophy after the trauma.  I am not sure this is likely ever to get better.  I would not recommend consideration of a calcium channel blocker given her low blood pressure.  I would recommend avoidance of cold stimulation and local measures.  I suspect this will always be a nuisance for her although it could worsen or get better over time it is difficult to tell.  She can return to see [place] on an as-needed basis.

## 2021-03-25 NOTE — Assessment & Plan Note (Signed)
It is possible that this is contributing to her vasomotor symptoms of the right upper extremity although difficult to discern.

## 2021-03-25 NOTE — Assessment & Plan Note (Signed)
She chronically runs low, and this would preclude the use of any calcium channel blockers to reduce vasospasm.

## 2021-04-24 ENCOUNTER — Other Ambulatory Visit (HOSPITAL_COMMUNITY): Payer: Self-pay | Admitting: Physician Assistant

## 2021-04-24 ENCOUNTER — Other Ambulatory Visit: Payer: Self-pay | Admitting: Physician Assistant

## 2021-04-24 DIAGNOSIS — Q211 Atrial septal defect, unspecified: Secondary | ICD-10-CM
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
## 2021-05-12 LAB — HM DIABETES EYE EXAM

## 2021-05-16 ENCOUNTER — Telehealth: Payer: Self-pay | Admitting: Family Medicine

## 2021-05-16 ENCOUNTER — Ambulatory Visit: Payer: Self-pay

## 2021-05-16 NOTE — Telephone Encounter (Signed)
?  Chief Complaint: hand swelling ?Symptoms: bilat hand swelling in fingers, trouble sleeping  ?Frequency: several days  ?Pertinent Negatives: Patient denies pain in hands just discomfort ?Disposition: '[]'$ ED /'[]'$ Urgent Care (no appt availability in office) / '[x]'$ Appointment(In office/virtual)/ '[]'$  [facility]/ '[]'$ Home Care/ '[]'$ Refused Recommended Disposition /'[]'$ [facility]/ '[]'$  Follow-up with PCP ?Additional Notes: pt is also concerned about her depression and needing medications possibly adjusted d/t things have changed in her life in the past couple of months and has been dealing with a lot that is affecting her sleep. I advised her pt to try melatonin until can be seen on Monday 05/19/21. Pt verbalized understanding.  ? ?Summary: swelling in hands/insomnia  ? Pt stated she is not sleeping well; she is on medication, but it is not helping.  Pt stated she has been experiencing swelling in hands in the morning and at night.  ? ?Scheduled pt for 05/19/2021 because it was the only appointment available with PCP.  ? ? ?Pt seeking clinical advice.   ?  ? ?Reason for Disposition ? Swollen joint of new-onset ? ?Answer Assessment - Initial Assessment Questions ?1. ONSET: "When did the pain start?" ?    Couple of days  ?2. LOCATION: "Where is the pain located?" ?    Bilat hands  ?3. PAIN: "How bad is the pain?" (Scale 1-10; or mild, moderate, severe) ?  - MILD (1-3): doesn't interfere with normal activities ?  - MODERATE (4-7): interferes with normal activities (e.g., work or school) or awakens from sleep ?  - SEVERE (8-10): excruciating pain, unable to use hand at all ?    discomfort ?7. OTHER SYMPTOMS: "Do you have any other symptoms?" (e.g., neck pain, swelling, rash, numbness, fever) ?    Swelling in fingers ? ?Protocols used: Hand and Wrist Pain-A-AH ? ?

## 2021-05-17 MED ORDER — PANTOPRAZOLE SODIUM 40 MG PO TBEC: 40.0000 mg | DELAYED_RELEASE_TABLET | Freq: Every day | ORAL | 3 refills | Status: DC

## 2021-05-17 NOTE — Telephone Encounter (Signed)
Refill request is for '10mg'$  citalopram, but we increased to '20mg'$  back in October. Is she wanting to to go down to '10mg'$  or stay on 20? ?

## 2021-05-19 ENCOUNTER — Other Ambulatory Visit: Payer: Self-pay | Admitting: Family Medicine

## 2021-05-19 ENCOUNTER — Telehealth: Payer: Self-pay | Admitting: Family Medicine

## 2021-05-19 ENCOUNTER — Ambulatory Visit: Payer: Medicaid Other | Admitting: Family Medicine

## 2021-05-19 ENCOUNTER — Encounter: Payer: Self-pay | Admitting: Family Medicine

## 2021-05-19 VITALS — BP 100/60 | HR 82 | Temp 98.5°F | Resp 16 | Wt 138.5 lb

## 2021-05-19 DIAGNOSIS — F32A Depression, unspecified: Secondary | ICD-10-CM | POA: Diagnosis not present

## 2021-05-19 DIAGNOSIS — F5101 Primary insomnia: Secondary | ICD-10-CM

## 2021-05-19 DIAGNOSIS — G43809 Other migraine, not intractable, without status migrainosus: Secondary | ICD-10-CM

## 2021-05-19 DIAGNOSIS — F410 Panic disorder [episodic paroxysmal anxiety] without agoraphobia: Secondary | ICD-10-CM | POA: Diagnosis not present

## 2021-05-19 DIAGNOSIS — R350 Frequency of micturition: Secondary | ICD-10-CM | POA: Diagnosis not present

## 2021-05-19 DIAGNOSIS — R55 Syncope and collapse: Secondary | ICD-10-CM

## 2021-05-19 LAB — POCT URINALYSIS DIPSTICK
Bilirubin, UA: NEGATIVE
Blood, UA: NEGATIVE
Glucose, UA: NEGATIVE
Ketones, UA: NEGATIVE
Nitrite, UA: NEGATIVE
Protein, UA: NEGATIVE
Spec Grav, UA: 1.025 (ref 1.010–1.025)
Urobilinogen, UA: 0.2 E.U./dL
pH, UA: 5.5 (ref 5.0–8.0)

## 2021-05-19 MED ORDER — CITALOPRAM HYDROBROMIDE 20 MG PO TABS: 40.0000 mg | ORAL_TABLET | Freq: Every day | ORAL | Status: DC

## 2021-05-19 MED ORDER — ZOLPIDEM TARTRATE 10 MG PO TABS: ORAL_TABLET | ORAL | 1 refills | Status: DC

## 2021-05-19 NOTE — Telephone Encounter (Signed)
Patient was seen today in the office. Do we still need to call and ask her about the Citalopram dose, or was this discussed during the office visit?  ?
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
## 2021-05-19 NOTE — Telephone Encounter (Signed)
Copied from [place] [id number]. Topic: General - Other ?>> May 19, 2021  4:21 PM Tessa Lerner A wrote: ?Reason for CRM: The patient would like to speak with a member of staff when possible ? ?The patient was seen today 05/19/21 and under the impression that their zolpidem (AMBIEN) 10 MG tablet [[id number]]  would be increased to 20 MG ? ?The patient has picked up the medication  ? ?The patient would like to have their prescriptions confirmed when possible  ? ?Please contact further ?

## 2021-05-19 NOTE — Telephone Encounter (Signed)
She is supposed to go up to '40mg'$  a day, which is 2 x '20mg'$  tablets. If that works better we will change her next refill to '40mg'$  tablets. If not will try adding a different medications called buspirone.  ?

## 2021-05-23 ENCOUNTER — Encounter: Payer: Self-pay | Admitting: Family Medicine

## 2021-05-27 ENCOUNTER — Telehealth: Payer: Self-pay | Admitting: Family Medicine

## 2021-05-27 NOTE — Telephone Encounter (Signed)
Pt called and stated that she does not want to go to [facility] in [place]. She stated that it is to far away. I let her know that we are very limited with Psychiatry referrals in the [place] area. She also has medicaid. [facility] is not accepting patients at this time. Patient is wanting to know if Dr Caryn Section will continue to refill medications. Please advise.  ?

## 2021-06-01 ENCOUNTER — Ambulatory Visit
Admission: EM | Admit: 2021-06-01 | Discharge: 2021-06-01 | Disposition: A | Discharge status: Home / Self Care | Payer: Medicaid Other | Attending: Physician Assistant | Admitting: Physician Assistant

## 2021-06-01 DIAGNOSIS — B353 Tinea pedis: Secondary | ICD-10-CM

## 2021-06-01 DIAGNOSIS — R591 Generalized enlarged lymph nodes: Secondary | ICD-10-CM | POA: Diagnosis not present

## 2021-06-01 DIAGNOSIS — S90861A Insect bite (nonvenomous), right foot, initial encounter: Secondary | ICD-10-CM | POA: Diagnosis not present

## 2021-06-01 DIAGNOSIS — W57XXXA Bitten or stung by nonvenomous insect and other nonvenomous arthropods, initial encounter: Secondary | ICD-10-CM

## 2021-06-01 DIAGNOSIS — R21 Rash and other nonspecific skin eruption: Secondary | ICD-10-CM

## 2021-06-01 DIAGNOSIS — R5383 Other fatigue: Secondary | ICD-10-CM

## 2021-06-01 MED ORDER — CLOTRIMAZOLE-BETAMETHASONE 1-0.05 % EX CREA: TOPICAL_CREAM | CUTANEOUS | 0 refills | Status: DC

## 2021-06-01 MED ORDER — FLUCONAZOLE 150 MG PO TABS: ORAL_TABLET | ORAL | 1 refills | Status: DC

## 2021-06-01 MED ORDER — AMOXICILLIN 500 MG PO CAPS: 500.0000 mg | ORAL_CAPSULE | Freq: Three times a day (TID) | ORAL | 0 refills | Status: AC

## 2021-06-01 NOTE — ED Triage Notes (Signed)
Pt states that she had a tick on her right foot between the 3rd and 4th toe Friday night. ? ? Pt now has a lump along the back of her neck and has a headache. ? ?Pt is worried about possible [place] spotted fever.  ? ?Pt has found another tick on her chest this morning. She states that it was just crawling on her skin and had not bit her yet.  ?Pt has brought the ticks with her, they are not dead.  ?

## 2021-06-01 NOTE — Discharge Instructions (Addendum)
-  I do not think that you have recommend spotted fever.  I understand the concern about tick diseases.  I have sent amoxicillin to the pharmacy for you. ?- The swollen lymph node may be due to or early tick disease (unlikely) or a cold.  Increase rest and fluids.  Take over-the-counter DayQuil or NyQuil as needed for any cough or congestion. ?- I have sent a cream for you to apply ice to your foot.  I think you may have a little bit of athlete's foot in addition to the tick bite. ?- Follow-up with your primary care provider if you are still concerned and they can draw titers for tick diseases but you will have to wait at least 3 weeks. ?
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
## 2021-06-02 ENCOUNTER — Telehealth: Payer: Self-pay

## 2021-06-02 NOTE — Telephone Encounter (Signed)
Copied from [place]. Topic: General - Other ?>> Jun 02, 2021  3:24 PM [place] wrote: ?Reason for CRM: Pt wanting to know she has obtained an appt with a psychiatrist in mid May. Just wanted to Nelson him ?

## 2021-07-14 ENCOUNTER — Ambulatory Visit: Payer: Self-pay

## 2021-07-14 ENCOUNTER — Telehealth: Payer: Self-pay

## 2021-07-14 ENCOUNTER — Telehealth: Payer: Self-pay | Admitting: Family Medicine

## 2021-07-14 ENCOUNTER — Ambulatory Visit: Payer: Medicaid Other | Admitting: Family Medicine

## 2021-07-14 ENCOUNTER — Encounter: Payer: Self-pay | Admitting: Family Medicine

## 2021-07-14 VITALS — BP 102/55 | HR 77 | Temp 97.8°F | Resp 14 | Wt 137.0 lb

## 2021-07-14 DIAGNOSIS — R6883 Chills (without fever): Secondary | ICD-10-CM

## 2021-07-14 DIAGNOSIS — M791 Myalgia, unspecified site: Secondary | ICD-10-CM

## 2021-07-14 DIAGNOSIS — S30861A Insect bite (nonvenomous) of abdominal wall, initial encounter: Secondary | ICD-10-CM | POA: Diagnosis not present

## 2021-07-14 DIAGNOSIS — J029 Acute pharyngitis, unspecified: Secondary | ICD-10-CM | POA: Diagnosis not present

## 2021-07-14 DIAGNOSIS — W57XXXA Bitten or stung by nonvenomous insect and other nonvenomous arthropods, initial encounter: Secondary | ICD-10-CM

## 2021-07-14 MED ORDER — CHLORAMPHENICOL POWD: 500.0000 mg | Freq: Four times a day (QID) | 0 refills | Status: DC

## 2021-07-14 NOTE — Telephone Encounter (Signed)
Copied from [place] [phone]. Topic: General - Other >> Jul 14, 2021  3:55 PM [place] C wrote: Reason for CRM: The patient would like for the practice to contact their preferred pharmacy and confirm the amount of days they're supposed to take their Chloramphenicol POWD [[id number]]    The patient's pharmacy would like to know if the patient is supposed to take the medication for 5 days or 10 days   [place], [place] - [address] [address] Phone: [phone] Fax: [phone] Hours: Not open 24 hours  Please contact further when possible

## 2021-07-14 NOTE — Telephone Encounter (Signed)
  Chief Complaint: Tick bite Symptoms: area is red. Pt is report chills, runny nose and sore throat Frequency: Tick removed Sunday Pertinent Negatives: Patient denies fever Disposition: '[]'$ ED /'[]'$ Urgent Care (no appt availability in office) / '[x]'$ Appointment(In office/virtual)/ '[]'$  [facility]/ '[]'$ Home Care/ '[]'$ Refused Recommended Disposition /'[]'$ [facility]/ '[]'$  Follow-up with PCP Additional Notes: Pt was outside on Thursday, tick was found on Sunday and removed. Tick was very small, bur engorged with blood.    Reason for Disposition  [1] 2 to 14 days following tick bite AND [2] widespread rash or headache AND [3] no fever  Answer Assessment - Initial Assessment Questions 1. TYPE of TICK: "Is it a wood tick or a deer tick?" (e.g., deer tick, wood tick; unsure)     unknown 2. SIZE of TICK: "How big is the tick?" (e.g., size of poppy seed, apple seed, watermelon seed; unsure) Note: Deer ticks can be the size of a poppy seed (nymph) or an apple seed (adult).       Pencil lead  3. ENGORGED: "Did the tick look flat or engorged (full, swollen)?" (e.g., flat, engorged; unsure)     yes 4. LOCATION: "Where is the tick bite located?"      [place] 5. ONSET: "How long do you think the tick was attached before you removed it?" (e.g., 5 hours, 2 days)      3 days 6. APPEARANCE of BITE or RASH: "What does the site look like?"     Red 7. PREGNANCY: "Is there any chance you are pregnant?" "When was your last menstrual period?"     no  Protocols used: Tick Bite-A-AH

## 2021-07-14 NOTE — Telephone Encounter (Signed)
Copied from [place] [phone]. Topic: General - Call Back - No Documentation >> Jul 14, 2021 12:33 PM Oley Balm E wrote: Reason for CRM: Pt called requesting a call back from the office, she is still waiting for her compound medication. Please advise.   [place] [facility], [place] - [place] [address] Phone: [phone] Fax: [phone]  Best contact: [phone]  Requesting a call back from the clinic

## 2021-07-14 NOTE — Telephone Encounter (Signed)
Prescription was written for 20 grams.   '500mg'$  x 4 = 2,000 mg per day = 2 grams per day 2 grams per day x 10 days = 20 gram.

## 2021-07-14 NOTE — Telephone Encounter (Signed)
Regarding: Chloramphenicol POWD 500 mg, 4 times daily     Amount of medication prescribed is not enough for a 10 day supply- 500 mg- 1 tablet  4x a day- is only 5 days. Please send updated Rx if provider wants 10 day supply.   Advised pharmacy I would send message to provider for review and update

## 2021-07-14 NOTE — Telephone Encounter (Signed)
Patient seen in the office today for this problem.

## 2021-07-14 NOTE — Progress Notes (Signed)
I,[name],acting as a scribe for Lelon Huh, MD.,have documented all relevant documentation on the behalf of Lelon Huh, MD,as directed by  Lelon Huh, MD while in the presence of Lelon Huh, MD.    Established patient visit   Patient: Erin Richards   DOB: 06/11/1965   56 y.o. Female  MRN: [id number] Visit Date: 07/14/2021  Today's healthcare provider: Lelon Huh, MD   Chief Complaint  Patient presents with   Insect Bite   Subjective    HPI  Tick Bite: Patient reports having a tick bite on the left side of abdomen. Patient was outside on Thursday (07/10/2021), tick was found on Sunday and removed. She reports the tick was very small, but engorged with blood. The tick bite area is red. Pt is report chills, muscle aches, malaise, runny nose and sore throat since last night, but no rash.  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 She states that she feels then same way now as she did then with the rock mountain spotted fever.   Medications: Outpatient Medications Prior to Visit  Medication Sig   ACCU-CHEK GUIDE test strip USE UP TO FOUR TIMES DAILY AS DIRECTED.   Accu-Chek Softclix Lancets lancets Use to check blood sugar daily   ALPRAZolam (XANAX) 0.5 MG tablet Take 0.5-1 tablets (0.25-0.5 mg total) by mouth 2 (two) times daily as needed (pain attacks).   AMBULATORY NON FORMULARY MEDICATION Automatic brachial blood pressure monitor.   Dx: Labile blood pressure, dizziness   Azelastine HCl 137 MCG/SPRAY SOLN PLACE 1 SPRAY INTO BOTH NOSTRILS 2 (TWO) TIMES DAILY   Blood Glucose Monitoring Suppl (ACCU-CHEK GUIDE) w/Device KIT Use daily to check blood sugars   cetirizine (ZYRTEC) 10 MG tablet Take 1 tablet (10 mg total) by mouth daily. For allergies   citalopram (CELEXA) 20 MG tablet Take 2 tablets (40 mg total) by mouth daily.   clotrimazole-betamethasone (LOTRISONE) cream Apply to affected area 2 times daily prn   COMBIVENT  RESPIMAT 20-100 MCG/ACT AERS respimat INHALE 1 PUFF INTO THE LUNGS EVERY 4 HOURS AS NEEDED FOR WHEEZING.   estradiol (ESTRACE) 0.5 MG tablet Take 1 tablet (0.5 mg total) by mouth daily.   fluconazole (DIFLUCAN) 150 MG tablet Take 1 tab p.o. every 72 hours as needed yeast infection   fluticasone (FLONASE) 50 MCG/ACT nasal spray PLACE 2 SPRAYS INTO BOTH NOSTRILS DAILY AS NEEDED FOR ALLERGIES OR RHINITIS.   hyoscyamine (LEVSIN) 0.125 MG tablet TAKE ONE TABLET BY MOUTH EVERY 6 HOURS AS NEEDED FOR STOMACH CRAMPINGS   ipratropium (ATROVENT) 0.03 % nasal spray PLACE 2 SPRAYS INTO THE NOSE DAILY AS NEEDED.   linaclotide (LINZESS) 145 MCG CAPS capsule Take 1 capsule (145 mcg total) by mouth daily before breakfast.   montelukast (SINGULAIR) 10 MG tablet TAKE 1 TABLET BY MOUTH EVERYDAY AT BEDTIME   Multiple Vitamin (MULTIVITAMIN WITH MINERALS) TABS tablet Take 2 tablets by mouth daily.   naproxen (NAPROSYN) 500 MG tablet TAKE 1 TABLET (500 MG TOTAL) BY MOUTH 2 (TWO) TIMES DAILY WITH A MEAL. AS NEEDED FOR PAIN   nortriptyline (PAMELOR) 25 MG capsule TAKE ONE CAPSULE AT BEDTIME FOR MIGRANE PREVENTION AND IRRITABLE BOWEL SYNDROME   ondansetron (ZOFRAN-ODT) 4 MG disintegrating tablet ondansetron 4 mg disintegrating tablet  PLEASE SEE ATTACHED FOR DETAILED DIRECTIONS   OXYGEN Inhale 2 L into the lungs at bedtime as needed.   pantoprazole (PROTONIX) 40 MG tablet Take 1 tablet (40 mg total) by mouth daily. For  acid reflux   promethazine (PHENERGAN) 12.5 MG tablet Take 1 tablet (12.5 mg total) by mouth every 8 (eight) hours as needed for nausea or vomiting.   Spacer/Aero-Holding Chambers (AEROCHAMBER PLUS) inhaler Use with inhaler   VENTOLIN HFA 108 (90 Base) MCG/ACT inhaler Inhale 2 puffs into the lungs every 6 (six) hours as needed for wheezing or shortness of breath.   zolpidem (AMBIEN) 10 MG tablet TAKE 1 TABLET BY MOUTH EVERYDAY AT BEDTIME   No facility-administered medications prior to visit.    Review of  Systems  Constitutional:  Positive for chills and fatigue. Negative for appetite change and fever.  HENT:  Positive for rhinorrhea and sore throat.   Respiratory:  Negative for chest tightness and shortness of breath.   Cardiovascular:  Negative for chest pain and palpitations.  Gastrointestinal:  Negative for abdominal pain, nausea and vomiting.  Musculoskeletal:  Positive for myalgias and neck pain.  Neurological:  Positive for headaches. Negative for dizziness and weakness.       Objective    BP (!) 102/55 (BP Location: Right Arm, Patient Position: Sitting, Cuff Size: Normal)   Pulse 77   Temp 97.8 F (36.6 C) (Oral)   Resp 14   Wt 137 lb (62.1 kg)   SpO2 99% Comment: room air  BMI 29.65 kg/m    Physical Exam   General: Appearance:     Well developed, well nourished female in no acute distress  Eyes:    PERRL, conjunctiva/corneas clear, EOM's intact       Lungs:     Clear to auscultation bilaterally, respirations unlabored  MS:   All extremities are intact.    Neurologic:   Awake, alert, oriented x 3. No apparent focal neurological defect.   Skin:   Small insect puncture left inguinal area with no surrounding rash or erythema.      Assessment & Plan     1. Sore throat   2. Myalgia   3. Chills   4. 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 Was treated empirically in 2015 for same which she states cleared with chloramphenicol although review of records indicate she required second round of antibiotic since she did not improve with initial  course.   - Chloramphenicol POWD; Take 500 mg by mouth 4 (four) times daily for 10 days.  Dispense: 20 g; Refill: 0  Sent to [facility].      The entirety of the information documented in the History of Present Illness, Review of Systems and  Physical Exam were personally obtained by me. Portions of this information were initially documented by the CMA and reviewed by me for thoroughness and accuracy.     Lelon Huh, MD  [facility] [phone] (phone) [phone] (fax)  [place]

## 2021-07-14 NOTE — Telephone Encounter (Signed)
Long hold with pharmacy to inquire about this med.  Pt states they had more questions for provider before dispensing.  No one ever came to the phone. [facility] to office for necessary info. If pharmacy r/t's call okay for [facility] nurse to inquire.

## 2021-07-15 ENCOUNTER — Ambulatory Visit: Payer: Self-pay

## 2021-07-15 ENCOUNTER — Telehealth: Payer: Self-pay

## 2021-07-15 MED ORDER — ONDANSETRON 4 MG PO TBDP
4.0000 mg | ORAL_TABLET | Freq: Three times a day (TID) | ORAL | 0 refills | Status: DC | PRN
Start: 2021-07-15 — End: 2022-06-02

## 2021-07-15 MED ORDER — CHLORAMPHENICOL POWD
500.0000 mg | Freq: Four times a day (QID) | 0 refills | Status: AC
Start: 2021-07-15 — End: 2021-07-25

## 2021-07-15 NOTE — Telephone Encounter (Signed)
Pt states she is now nauseated and requesting something for nausea, she says due to not taking the medication.

## 2021-07-15 NOTE — Telephone Encounter (Signed)
Copied from [place] [id number]. Topic: General - Other >> Jul 15, 2021 12:27 PM Oley Balm E wrote: Reason for CRM: Pt called requesting a doctor's note that excuses her from work because she has yet to receive her medication from PCP and pharmacy. She is still missing nausea medication.   Needs to be excused from work covering all of this week. 06/12-06/16/2023  Please send Doctor's note via mychart

## 2021-07-15 NOTE — Addendum Note (Signed)
Addended by: Birdie Sons on: 07/15/2021 09:40 AM   Modules accepted: Orders

## 2021-07-15 NOTE — Telephone Encounter (Signed)
  Chief Complaint: nausea Symptoms: nausea Frequency: started last night Pertinent Negatives: Patient denies vomiting Disposition: '[]'$ ED /'[]'$ Urgent Care (no appt availability in office) / '[]'$ Appointment(In office/virtual)/ '[]'$  [place] [facility]/ '[x]'$ Home Care/ '[]'$ Refused Recommended Disposition /'[]'$ [place] [facility]/ '[]'$  Follow-up with PCP Additional Notes: pt requesting nausea medication to be called into [facility]. Pt wanting to verify if med is at [facility] and pharmacist is still seeking clarification of Chloramphenicol- discussed Dr Sabino Snipes clarification note. Please resnd order for Chloramphenicol as written in Dr Sabino Snipes note  dated 07/14/21 Reason for Disposition  Unexplained nausea  Answer Assessment - Initial Assessment Questions 1. NAUSEA SEVERITY: "How bad is the nausea?" (e.g., mild, moderate, severe; dehydration, weight loss)   - MILD: loss of appetite without change in eating habits   - MODERATE: decreased oral intake without significant weight loss, dehydration, or malnutrition   - SEVERE: inadequate caloric or fluid intake, significant weight loss, symptoms of dehydration     milf 2. ONSET: "When did the nausea begin?"     Last night 3. VOMITING: "Any vomiting?" If Yes, ask: "How many times today?"     no 4. RECURRENT SYMPTOM: "Have you had nausea before?" If Yes, ask: "When was the last time?" "What happened that time?"     yes 5. CAUSE: "What do you think is causing the nausea?"     From tick bite  Protocols used: Nausea-A-AH

## 2021-07-15 NOTE — Telephone Encounter (Signed)
That's fine

## 2021-07-16 NOTE — Telephone Encounter (Signed)
Pt requesting letter be mailed to home address and sent to My Chart.  Done

## 2021-07-17 ENCOUNTER — Encounter: Payer: Self-pay | Admitting: Family Medicine

## 2021-07-18 ENCOUNTER — Encounter: Payer: Self-pay | Admitting: Family Medicine

## 2021-07-18 ENCOUNTER — Ambulatory Visit: Payer: Self-pay

## 2021-07-18 ENCOUNTER — Ambulatory Visit: Payer: Medicaid Other | Admitting: Family Medicine

## 2021-07-18 VITALS — BP 95/48 | HR 75 | Temp 98.0°F | Wt 136.7 lb

## 2021-07-18 DIAGNOSIS — J40 Bronchitis, not specified as acute or chronic: Secondary | ICD-10-CM | POA: Diagnosis not present

## 2021-07-18 DIAGNOSIS — R300 Dysuria: Secondary | ICD-10-CM

## 2021-07-18 LAB — POCT URINALYSIS DIPSTICK
Bilirubin, UA: NEGATIVE
Blood, UA: NEGATIVE
Glucose, UA: NEGATIVE
Ketones, UA: NEGATIVE
Nitrite, UA: NEGATIVE
Protein, UA: NEGATIVE
Spec Grav, UA: 1.01 (ref 1.010–1.025)
Urobilinogen, UA: 0.2 E.U./dL
pH, UA: 6 (ref 5.0–8.0)

## 2021-07-18 MED ORDER — CEFDINIR 300 MG PO CAPS: 600.0000 mg | ORAL_CAPSULE | Freq: Every day | ORAL | 0 refills | Status: AC

## 2021-07-18 NOTE — Telephone Encounter (Signed)
Pt being seen in office today.

## 2021-07-18 NOTE — Telephone Encounter (Signed)
  Chief Complaint: Cough, sore throat, weakness, fever Symptoms: ibid Frequency: Monday Pertinent Negatives: Patient denies SOB Disposition: '[]'$ ED /'[]'$ Urgent Care (no appt availability in office) / '[x]'$ Appointment(In office/virtual)/ '[]'$  [place] [facility]/ '[]'$ Home Care/ '[]'$ Refused Recommended Disposition /'[]'$ [place] [facility]/ '[]'$  Follow-up with PCP Additional Notes: Pt was seen Monday and is no better today. She is hoarse, sore throat, coughing, and weakness.    Summary: productive cough, sore throat   Patient states "coughing up green stuff and my throat hurts some please let me know what to do" x1d patient states she is weak   Please assist patient further      Answer Assessment - Initial Assessment Questions 1. ONSET: "When did the throat start hurting?" (Hours or days ago)      Monday5/10 2. SEVERITY: "How bad is the sore throat?" (Scale 1-10; mild, moderate or severe)   - MILD (1-3):  doesn't interfere with eating or normal activities   - MODERATE (4-7): interferes with eating some solids and normal activities   - SEVERE (8-10):  excruciating pain, interferes with most normal activities   - SEVERE DYSPHAGIA: can't swallow liquids, drooling     5/10 3. STREP EXPOSURE: "Has there been any exposure to strep within the past week?" If Yes, ask: "What type of contact occurred?"       4.  VIRAL SYMPTOMS: "Are there any symptoms of a cold, such as a runny nose, cough, hoarse voice or red eyes?"      Cough hoarse voice 5. FEVER: "Do you have a fever?" If Yes, ask: "What is your temperature, how was it measured, and when did it start?"     Chills - fever 99.8 yesterday 6. PUS ON THE TONSILS: "Is there pus on the tonsils in the back of your throat?"     no 7. OTHER SYMPTOMS: "Do you have any other symptoms?" (e.g., difficulty breathing, headache, rash)      8. PREGNANCY: "Is there any chance you are pregnant?" "When was your last menstrual period?"     na  Protocols used: Sore  Throat-A-AH

## 2021-07-18 NOTE — Progress Notes (Unsigned)
I,[name],acting as a Education administrator for Lelon Huh, MD.,have documented all relevant documentation on the behalf of Lelon Huh, MD,as directed by  Lelon Huh, MD while in the presence of Lelon Huh, MD.   Established patient visit   Patient: Erin Richards   DOB: 01-13-1966   56 y.o. Female  MRN: [id number] Visit Date: 07/18/2021  Today's healthcare provider: Lelon Huh, MD   No chief complaint on file.  Subjective    HPI  Patient reports she still has a sore throat but has now been coughing up green stuff since Wednesday, no appetite, has had chills and low grade fever (99.8 last night with oral thermometer). Has been taking medication that was prescribed last visit and tylenol. Also reports having no energy just has been in the bed. Was seen earlier this week for generalized myalgias chills and sweats following embedded tick bit and has since started chloramphenicol as symptoms were identical to previous episode of tick borne infection.   Medications: Outpatient Medications Prior to Visit  Medication Sig   ACCU-CHEK GUIDE test strip USE UP TO FOUR TIMES DAILY AS DIRECTED.   Accu-Chek Softclix Lancets lancets Use to check blood sugar daily   ALPRAZolam (XANAX) 0.5 MG tablet Take 0.5-1 tablets (0.25-0.5 mg total) by mouth 2 (two) times daily as needed (pain attacks).   AMBULATORY NON FORMULARY MEDICATION Automatic brachial blood pressure monitor.   Dx: Labile blood pressure, dizziness   Azelastine HCl 137 MCG/SPRAY SOLN PLACE 1 SPRAY INTO BOTH NOSTRILS 2 (TWO) TIMES DAILY   Blood Glucose Monitoring Suppl (ACCU-CHEK GUIDE) w/Device KIT Use daily to check blood sugars   cetirizine (ZYRTEC) 10 MG tablet Take 1 tablet (10 mg total) by mouth daily. For allergies   Chloramphenicol POWD Take 500 mg by mouth 4 (four) times daily for 10 days.   citalopram (CELEXA) 20 MG tablet Take 2 tablets (40 mg total) by mouth daily.   clotrimazole-betamethasone (LOTRISONE) cream  Apply to affected area 2 times daily prn   COMBIVENT RESPIMAT 20-100 MCG/ACT AERS respimat INHALE 1 PUFF INTO THE LUNGS EVERY 4 HOURS AS NEEDED FOR WHEEZING.   estradiol (ESTRACE) 0.5 MG tablet Take 1 tablet (0.5 mg total) by mouth daily.   fluconazole (DIFLUCAN) 150 MG tablet Take 1 tab p.o. every 72 hours as needed yeast infection   fluticasone (FLONASE) 50 MCG/ACT nasal spray PLACE 2 SPRAYS INTO BOTH NOSTRILS DAILY AS NEEDED FOR ALLERGIES OR RHINITIS.   hyoscyamine (LEVSIN) 0.125 MG tablet TAKE ONE TABLET BY MOUTH EVERY 6 HOURS AS NEEDED FOR STOMACH CRAMPINGS   ipratropium (ATROVENT) 0.03 % nasal spray PLACE 2 SPRAYS INTO THE NOSE DAILY AS NEEDED.   linaclotide (LINZESS) 145 MCG CAPS capsule Take 1 capsule (145 mcg total) by mouth daily before breakfast.   montelukast (SINGULAIR) 10 MG tablet TAKE 1 TABLET BY MOUTH EVERYDAY AT BEDTIME   Multiple Vitamin (MULTIVITAMIN WITH MINERALS) TABS tablet Take 2 tablets by mouth daily.   naproxen (NAPROSYN) 500 MG tablet TAKE 1 TABLET (500 MG TOTAL) BY MOUTH 2 (TWO) TIMES DAILY WITH A MEAL. AS NEEDED FOR PAIN   nortriptyline (PAMELOR) 25 MG capsule TAKE ONE CAPSULE AT BEDTIME FOR MIGRANE PREVENTION AND IRRITABLE BOWEL SYNDROME   ondansetron (ZOFRAN-ODT) 4 MG disintegrating tablet Take 1 tablet (4 mg total) by mouth every 8 (eight) hours as needed for nausea or vomiting.   OXYGEN Inhale 2 L into the lungs at bedtime as needed.   pantoprazole (PROTONIX) 40 MG tablet  Take 1 tablet (40 mg total) by mouth daily. For acid reflux   promethazine (PHENERGAN) 12.5 MG tablet Take 1 tablet (12.5 mg total) by mouth every 8 (eight) hours as needed for nausea or vomiting.   Spacer/Aero-Holding Chambers (AEROCHAMBER PLUS) inhaler Use with inhaler   VENTOLIN HFA 108 (90 Base) MCG/ACT inhaler Inhale 2 puffs into the lungs every 6 (six) hours as needed for wheezing or shortness of breath.   zolpidem (AMBIEN) 10 MG tablet TAKE 1 TABLET BY MOUTH EVERYDAY AT BEDTIME   No  facility-administered medications prior to visit.    Review of Systems     Objective    BP (!) 95/48   Pulse 75   Temp 98 F (36.7 C) (Oral)   Wt 136 lb 11.2 oz (62 kg)   SpO2 99%   BMI 29.58 kg/m    Physical Exam   General: Appearance:     Well developed, well nourished female in no acute distress  Eyes:    PERRL, conjunctiva/corneas clear, EOM's intact       Lungs:     Occasional expiratory wheeze bilaterally, respirations unlabored  Heart:    Normal heart rate. Normal rhythm.   MS:   All extremities are intact.    Neurologic:   Awake, alert, oriented x 3. No apparent focal neurological defect.        Results for orders placed or performed in visit on 07/18/21  POCT Urinalysis Dipstick  Result Value Ref Range   Color, UA yellow    Clarity, UA clear    Glucose, UA Negative Negative   Bilirubin, UA negative    Ketones, UA negative    Spec Grav, UA 1.010 1.010 - 1.025   Blood, UA negative    pH, UA 6.0 5.0 - 8.0   Protein, UA Negative Negative   Urobilinogen, UA 0.2 0.2 or 1.0 E.U./dL   Nitrite, UA negative    Leukocytes, UA Trace (A) Negative   Appearance     Odor       Assessment & Plan     1. Dysuria   2. Bronchitis  - cefdinir (OMNICEF) 300 MG capsule; Take 2 capsules (600 mg total) by mouth daily for 10 days.  Dispense: 20 capsule; Refill: 0      The entirety of the information documented in the History of Present Illness, Review of Systems and Physical Exam were personally obtained by me. Portions of this information were initially documented by the CMA and reviewed by me for thoroughness and accuracy.     Lelon Huh, MD  [facility] [phone] (phone) [phone] (fax)  [place]

## 2021-07-23 ENCOUNTER — Encounter: Payer: Self-pay | Admitting: Family Medicine

## 2021-07-24 ENCOUNTER — Ambulatory Visit: Payer: Medicaid Other | Admitting: Family Medicine

## 2021-07-24 ENCOUNTER — Ambulatory Visit: Payer: Self-pay | Admitting: *Deleted

## 2021-07-24 ENCOUNTER — Encounter: Payer: Self-pay | Admitting: Family Medicine

## 2021-07-24 VITALS — BP 90/51 | HR 74 | Temp 97.8°F | Resp 16 | Ht <= 58 in | Wt 140.0 lb

## 2021-07-24 DIAGNOSIS — T732XXA Exhaustion due to exposure, initial encounter: Secondary | ICD-10-CM | POA: Insufficient documentation

## 2021-07-24 DIAGNOSIS — T732XXS Exhaustion due to exposure, sequela: Secondary | ICD-10-CM | POA: Diagnosis not present

## 2021-07-24 DIAGNOSIS — K649 Unspecified hemorrhoids: Secondary | ICD-10-CM | POA: Diagnosis not present

## 2021-07-24 MED ORDER — HYDROCORTISONE ACETATE 25 MG RE SUPP: 25.0000 mg | Freq: Two times a day (BID) | RECTAL | 0 refills | Status: DC

## 2021-07-24 NOTE — Assessment & Plan Note (Signed)
Currently being treated for known tick bite with use of chloramephenicol powder, compounded into capsules QID for 10 days for symptoms similar to previous diagnosis of RMSF Since starting treatment, GI side effects continue Recommend start of probiotic Use of BRAT diet And increase of PO intake BP remains soft; however, stable over last 2 months All other vitals are stable Patient endorses fatigue; however, improved from standpoint of Nadara Mustard, [place]

## 2021-07-24 NOTE — Assessment & Plan Note (Signed)
Chronic, worsening Recommend use of internal suppository to assist Reach back to clinic if condition remains exacerbation following use of antibiotic for referral to GI/surgery for hemorrhoid banding

## 2021-07-24 NOTE — Telephone Encounter (Signed)
  Chief Complaint: rectal bleeding Symptoms: rectal bleeding, frequent BM, abdominal pain Frequency: 2 days Pertinent Negatives: Patient denies   Disposition: '[]'$ ED /'[]'$ Urgent Care (no appt availability in office) / '[x]'$ Appointment(In office/virtual)/ '[]'$  [place] [facility]/ '[]'$ Home Care/ '[]'$ Refused Recommended Disposition /'[]'$ [place] [facility]/ '[]'$  Follow-up with PCP Additional Notes:     Reason for Disposition  MODERATE rectal bleeding (small blood clots, passing blood without stool, or toilet water turns red)  Answer Assessment - Initial Assessment Questions 1. APPEARANCE of BLOOD: "What color is it?" "Is it passed separately, on the surface of the stool, or mixed in with the stool?"      Bright red 2. AMOUNT: "How much blood was passed?"      In underwear and bright red when has BM 3. FREQUENCY: "How many times has blood been passed with the stools?"      Every BM 4. ONSET: "When was the blood first seen in the stools?" (Days or weeks)      2 days 5. DIARRHEA: "Is there also some diarrhea?" If Yes, ask: "How many diarrhea stools in the past 24 hours?"      No- more stools than normal 6. CONSTIPATION: "Do you have constipation?" If Yes, ask: "How bad is it?"     no 7. RECURRENT SYMPTOMS: "Have you had blood in your stools before?" If Yes, ask: "When was the last time?" and "What happened that time?"      Yes- hemorrhoids  8. BLOOD THINNERS: "Do you take any blood thinners?" (e.g., Coumadin/warfarin, Pradaxa/dabigatran, aspirin)     *No Answer* 9. OTHER SYMPTOMS: "Do you have any other symptoms?"  (e.g., abdomen pain, vomiting, dizziness, fever)     Abdominal pain, frequent BM 10. PREGNANCY: "Is there any chance you are pregnant?" "When was your last menstrual period?"       *No Answer*  Protocols used: Rectal Bleeding-A-AH

## 2021-07-25 ENCOUNTER — Other Ambulatory Visit: Payer: Self-pay | Admitting: Family Medicine

## 2021-07-25 DIAGNOSIS — J301 Allergic rhinitis due to pollen: Secondary | ICD-10-CM

## 2021-08-01 ENCOUNTER — Other Ambulatory Visit: Payer: Self-pay | Admitting: Family Medicine

## 2021-08-01 DIAGNOSIS — J301 Allergic rhinitis due to pollen: Secondary | ICD-10-CM

## 2021-08-01 NOTE — Telephone Encounter (Signed)
Requested medication (s) are due for refill today: yes  Requested medication (s) are on the active medication list: yes  Last refill:  11/18/20  Future visit scheduled: no  Notes to clinic:  Unable to refill per protocol, cannot delegate. Routing for approval     Requested Prescriptions  Pending Prescriptions Disp Refills   ipratropium (ATROVENT) 0.03 % nasal spray [Pharmacy Med Name: IPRATROPIUM 0.03% SPRAY]  1    Sig: PLACE 2 SPRAYS INTO THE NOSE DAILY AS NEEDED.     Off-Protocol Failed - 08/01/2021  3:50 PM      Failed - Medication not assigned to a protocol, review manually.      Passed - Valid encounter within last 12 months    Recent Outpatient Visits           1 week ago Fatigue due to exposure, sequela   [facility] Gwyneth Sprout, FNP   2 weeks ago [place], Donald E, MD   2 weeks ago Sore throat   [facility] Birdie Sons, MD   2 months ago Urinary frequency   [facility] Birdie Sons, MD   5 months ago Sciatica of right side   [facility] Birdie Sons, MD             Off-Protocol Failed - 08/01/2021  3:50 PM      Failed - Medication not assigned to a protocol, review manually.      Passed - Valid encounter within last 12 months    Recent Outpatient Visits           1 week ago Fatigue due to exposure, sequela   [facility] Gwyneth Sprout, FNP   2 weeks ago [place], Donald E, MD   2 weeks ago Sore throat   [facility] Birdie Sons, MD   2 months ago Urinary frequency   [facility] Birdie Sons, MD   5 months ago Sciatica of right side   [facility] Birdie Sons, MD

## 2021-08-08 ENCOUNTER — Encounter: Payer: Self-pay | Admitting: Family Medicine

## 2021-08-08 ENCOUNTER — Ambulatory Visit: Payer: Medicaid Other | Admitting: Family Medicine

## 2021-08-08 VITALS — BP 93/54 | HR 81 | Temp 98.4°F | Resp 16 | Wt 138.0 lb

## 2021-08-08 DIAGNOSIS — J0110 Acute frontal sinusitis, unspecified: Secondary | ICD-10-CM

## 2021-08-08 DIAGNOSIS — B309 Viral conjunctivitis, unspecified: Secondary | ICD-10-CM

## 2021-08-08 MED ORDER — TOBRAMYCIN 0.3 % OP SOLN: 1.0000 [drp] | OPHTHALMIC | 0 refills | Status: AC

## 2021-08-08 MED ORDER — CEFDINIR 300 MG PO CAPS: 300.0000 mg | ORAL_CAPSULE | Freq: Two times a day (BID) | ORAL | 0 refills | Status: AC

## 2021-08-08 NOTE — Progress Notes (Signed)
I,[name],acting as a scribe for Lelon Huh, MD.,have documented all relevant documentation on the behalf of Lelon Huh, MD,as directed by  Lelon Huh, MD while in the presence of Lelon Huh, MD.    Established patient visit   Patient: Erin Richards   DOB: 05-Sep-1965   56 y.o. Female  MRN: [id number] Visit Date: 08/08/2021  Today's healthcare provider: Lelon Huh, MD   Chief Complaint  Patient presents with   Sinus Problem   Subjective    Cough This is a new problem. Episode onset: 1 week ago. The problem has been gradually worsening. Associated symptoms include chest pain (occurred yesterday while working in 92 degree heat; resolved after resting), headaches (right temproal), postnasal drip, rhinorrhea, a sore throat (scratchy throat) and shortness of breath. Pertinent negatives include no chills, ear congestion, ear pain, fever, hemoptysis or myalgias. Treatments tried: Sudafed. The treatment provided no relief.      Medications: Outpatient Medications Prior to Visit  Medication Sig   ACCU-CHEK GUIDE test strip USE UP TO FOUR TIMES DAILY AS DIRECTED.   Accu-Chek Softclix Lancets lancets Use to check blood sugar daily   ALPRAZolam (XANAX) 0.5 MG tablet Take 0.5-1 tablets (0.25-0.5 mg total) by mouth 2 (two) times daily as needed (pain attacks).   AMBULATORY NON FORMULARY MEDICATION Automatic brachial blood pressure monitor.   Dx: Labile blood pressure, dizziness   Azelastine HCl 137 MCG/SPRAY SOLN PLACE 1 SPRAY INTO BOTH NOSTRILS 2 (TWO) TIMES DAILY   Blood Glucose Monitoring Suppl (ACCU-CHEK GUIDE) w/Device KIT Use daily to check blood sugars   cetirizine (ZYRTEC) 10 MG tablet Take 1 tablet (10 mg total) by mouth daily. For allergies   citalopram (CELEXA) 20 MG tablet Take 2 tablets (40 mg total) by mouth daily.   clonazePAM (KLONOPIN) 0.5 MG tablet Take 0.5 mg by mouth daily as needed.   clotrimazole-betamethasone (LOTRISONE) cream Apply to  affected area 2 times daily prn   COMBIVENT RESPIMAT 20-100 MCG/ACT AERS respimat INHALE 1 PUFF INTO THE LUNGS EVERY 4 HOURS AS NEEDED FOR WHEEZING.   escitalopram (LEXAPRO) 20 MG tablet Take 20 mg by mouth daily.   estradiol (ESTRACE) 0.5 MG tablet Take 1 tablet (0.5 mg total) by mouth daily.   fluticasone (FLONASE) 50 MCG/ACT nasal spray PLACE 2 SPRAYS INTO BOTH NOSTRILS DAILY AS NEEDED FOR ALLERGIES OR RHINITIS.   hydrocortisone (ANUSOL-HC) 25 MG suppository Place 1 suppository (25 mg total) rectally 2 (two) times daily.   hyoscyamine (LEVSIN) 0.125 MG tablet TAKE ONE TABLET BY MOUTH EVERY 6 HOURS AS NEEDED FOR STOMACH CRAMPINGS   ipratropium (ATROVENT) 0.03 % nasal spray PLACE 2 SPRAYS INTO THE NOSE DAILY AS NEEDED.   linaclotide (LINZESS) 145 MCG CAPS capsule Take 1 capsule (145 mcg total) by mouth daily before breakfast.   montelukast (SINGULAIR) 10 MG tablet TAKE 1 TABLET BY MOUTH EVERYDAY AT BEDTIME   Multiple Vitamin (MULTIVITAMIN WITH MINERALS) TABS tablet Take 2 tablets by mouth daily.   naproxen (NAPROSYN) 500 MG tablet TAKE 1 TABLET (500 MG TOTAL) BY MOUTH 2 (TWO) TIMES DAILY WITH A MEAL. AS NEEDED FOR PAIN   nortriptyline (PAMELOR) 25 MG capsule TAKE ONE CAPSULE AT BEDTIME FOR MIGRANE PREVENTION AND IRRITABLE BOWEL SYNDROME   ondansetron (ZOFRAN-ODT) 4 MG disintegrating tablet Take 1 tablet (4 mg total) by mouth every 8 (eight) hours as needed for nausea or vomiting.   OXYGEN Inhale 2 L into the lungs at bedtime as needed.   pantoprazole (PROTONIX) 40  MG tablet Take 1 tablet (40 mg total) by mouth daily. For acid reflux   promethazine (PHENERGAN) 12.5 MG tablet Take 1 tablet (12.5 mg total) by mouth every 8 (eight) hours as needed for nausea or vomiting.   Spacer/Aero-Holding Chambers (AEROCHAMBER PLUS) inhaler Use with inhaler   VENTOLIN HFA 108 (90 Base) MCG/ACT inhaler Inhale 2 puffs into the lungs every 6 (six) hours as needed for wheezing or shortness of breath.   zolpidem  (AMBIEN) 10 MG tablet TAKE 1 TABLET BY MOUTH EVERYDAY AT BEDTIME   No facility-administered medications prior to visit.    Review of Systems  Constitutional:  Negative for appetite change, chills, fatigue and fever.  HENT:  Positive for postnasal drip, rhinorrhea, sinus pressure, sinus pain and sore throat (scratchy throat). Negative for ear pain and sneezing.        Green mucus production  Eyes:  Positive for discharge (right eye).  Respiratory:  Positive for shortness of breath. Negative for cough, hemoptysis and chest tightness.   Cardiovascular:  Positive for chest pain (occurred yesterday while working in 92 degree heat; resolved after resting). Negative for palpitations.  Gastrointestinal:  Positive for nausea (occurred yesterday while working in 92 degree heat). Negative for abdominal pain and vomiting.  Musculoskeletal:  Negative for myalgias.  Neurological:  Positive for headaches (right temproal). Negative for dizziness and weakness.       Objective    BP (!) 93/54 (BP Location: Left Arm, Patient Position: Sitting, Cuff Size: Normal)   Pulse 81   Temp 98.4 F (36.9 C) (Oral)   Resp 16   Wt 138 lb (62.6 kg)   SpO2 98% Comment: room air  BMI 29.86 kg/m    Physical Exam  Tender frontal and maxillary sinuses. Right inner eyelids injected. No drainage.   No results found for any visits on 08/08/21.  Assessment & Plan     1. Acute non-recurrent frontal sinusitis  - cefdinir (OMNICEF) 300 MG capsule; Take 1 capsule (300 mg total) by mouth 2 (two) times daily for 7 days.  Dispense: 14 capsule; Refill: 0  2. Acute viral conjunctivitis of right eye  - tobramycin (TOBREX) 0.3 % ophthalmic solution; Place 1 drop into the right eye every 4 (four) hours for 7 days.  Dispense: 5 mL; Refill: 0         The entirety of the information documented in the History of Present Illness, Review of Systems and Physical Exam were personally obtained by me. Portions of this information  were initially documented by the CMA and reviewed by me for thoroughness and accuracy.     Lelon Huh, MD  [facility] [phone] (phone) [phone] (fax)  [place]

## 2021-08-14 ENCOUNTER — Other Ambulatory Visit: Payer: Self-pay

## 2021-08-14 ENCOUNTER — Ambulatory Visit: Payer: Self-pay

## 2021-08-14 DIAGNOSIS — Z79890 Hormone replacement therapy (postmenopausal): Secondary | ICD-10-CM

## 2021-08-14 DIAGNOSIS — R109 Unspecified abdominal pain: Secondary | ICD-10-CM

## 2021-08-14 DIAGNOSIS — N951 Menopausal and female climacteric states: Secondary | ICD-10-CM

## 2021-08-14 DIAGNOSIS — J301 Allergic rhinitis due to pollen: Secondary | ICD-10-CM

## 2021-08-14 DIAGNOSIS — G43809 Other migraine, not intractable, without status migrainosus: Secondary | ICD-10-CM

## 2021-08-14 DIAGNOSIS — F5101 Primary insomnia: Secondary | ICD-10-CM

## 2021-08-14 DIAGNOSIS — F410 Panic disorder [episodic paroxysmal anxiety] without agoraphobia: Secondary | ICD-10-CM

## 2021-08-14 DIAGNOSIS — F32A Depression, unspecified: Secondary | ICD-10-CM

## 2021-08-14 MED ORDER — ESTRADIOL 0.5 MG PO TABS: 0.5000 mg | ORAL_TABLET | Freq: Every day | ORAL | 1 refills | Status: DC

## 2021-08-14 MED ORDER — HYOSCYAMINE SULFATE 0.125 MG PO TABS: ORAL_TABLET | ORAL | 1 refills | Status: DC

## 2021-08-14 MED ORDER — CETIRIZINE HCL 10 MG PO TABS: 10.0000 mg | ORAL_TABLET | Freq: Every day | ORAL | 1 refills | Status: DC

## 2021-08-14 MED ORDER — CITALOPRAM HYDROBROMIDE 20 MG PO TABS: 40.0000 mg | ORAL_TABLET | Freq: Every day | ORAL | 1 refills | Status: DC

## 2021-08-14 MED ORDER — ESCITALOPRAM OXALATE 20 MG PO TABS: 20.0000 mg | ORAL_TABLET | Freq: Every day | ORAL | 1 refills | Status: DC

## 2021-08-14 NOTE — Addendum Note (Signed)
Addended by: Erie Noe on: 08/14/2021 03:44 PM   Modules accepted: Orders

## 2021-08-14 NOTE — Telephone Encounter (Signed)
  Chief Complaint: IBS flare up Symptoms: abdominal cramping, nausea Frequency: started today Pertinent Negatives: Patient denies vomiting Disposition: '[]'$ ED /'[]'$ Urgent Care (no appt availability in office) / '[]'$ Appointment(In office/virtual)/ '[]'$  [place] [facility]/ '[x]'$ Home Care/ '[]'$ Refused Recommended Disposition /'[]'$ [place] [facility]/ '[]'$  Follow-up with PCP Additional Notes: pt was mainly calling in for med refill on Levsin for IBS. She said she bought some banana nut bread and didn't think about the nuts until after now has flare up. Was able to refill medication and pt didn't want to come in for OV.   Reason for Disposition  Abdominal pain  Answer Assessment - Initial Assessment Questions 1. LOCATION: "Where does it hurt?"      Abdomen  3. ONSET: "When did the pain begin?" (e.g., minutes, hours or days ago)      Today  5. PATTERN "Does the pain come and go, or is it constant?"    - If constant: "Is it getting better, staying the same, or worsening?"      (Note: Constant means the pain never goes away completely; most serious pain is constant and it progresses)     - If intermittent: "How long does it last?" "Do you have pain now?"     (Note: Intermittent means the pain goes away completely between bouts)     Constant  6. SEVERITY: "How bad is the pain?"  (e.g., Scale 1-10; mild, moderate, or severe)    - MILD (1-3): doesn't interfere with normal activities, abdomen soft and not tender to touch     - MODERATE (4-7): interferes with normal activities or awakens from sleep, abdomen tender to touch     - SEVERE (8-10): excruciating pain, doubled over, unable to do any normal activities        7. RECURRENT SYMPTOM: "Have you ever had this type of stomach pain before?" If Yes, ask: "When was the last time?" and "What happened that time?"      IBS flare up  8. AGGRAVATING FACTORS: "Does anything seem to cause this pain?" (e.g., foods, stress, alcohol)     Eating nuts  10. OTHER  SYMPTOMS: "Do you have any other symptoms?" (e.g., back pain, diarrhea, fever, urination pain, vomiting)       Nausea  Protocols used: Abdominal Pain - Upper-A-AH

## 2021-08-14 NOTE — Telephone Encounter (Signed)
Requested Prescriptions  Pending Prescriptions Disp Refills  . hyoscyamine (LEVSIN) 0.125 MG tablet 20 tablet 1    Sig: TAKE ONE TABLET BY MOUTH EVERY 6 HOURS AS NEEDED FOR STOMACH CRAMPINGS     Gastroenterology:  Antispasmodic Agents Passed - 08/14/2021  2:41 PM      Passed - Valid encounter within last 12 months    Recent Outpatient Visits          6 days ago Acute non-recurrent frontal sinusitis   [facility] Birdie Sons, MD   3 weeks ago Fatigue due to exposure, sequela   [facility] Gwyneth Sprout, FNP   3 weeks ago [place], Donald E, MD   1 month ago Sore throat   [facility] Birdie Sons, MD   2 months ago Urinary frequency   [facility] Birdie Sons, MD

## 2021-08-14 NOTE — Telephone Encounter (Signed)
Requested medication (s) are due for refill today: yes  Requested medication (s) are on the active medication list: yes  Last refill:  05/19/21 #30/1  Future visit scheduled: no  Notes to clinic:  Unable to refill per protocol, cannot delegate.    Requested Prescriptions  Pending Prescriptions Disp Refills   zolpidem (AMBIEN) 10 MG tablet 30 tablet 1    Sig: TAKE 1 TABLET BY MOUTH EVERYDAY AT BEDTIME     Not Delegated - Psychiatry:  Anxiolytics/Hypnotics Failed - 08/14/2021  3:44 PM      Failed - This refill cannot be delegated      Failed - Urine Drug Screen completed in last 360 days      Passed - Valid encounter within last 6 months    Recent Outpatient Visits           6 days ago Acute non-recurrent frontal sinusitis   [facility] Birdie Sons, MD   3 weeks ago Fatigue due to exposure, sequela   [facility] Gwyneth Sprout, FNP   3 weeks ago [place], Donald E, MD   1 month ago Sore throat   [facility] Birdie Sons, MD   2 months ago Urinary frequency   [facility] Birdie Sons, MD              Signed Prescriptions Disp Refills   hyoscyamine (LEVSIN) 0.125 MG tablet 20 tablet 1    Sig: TAKE ONE TABLET BY MOUTH EVERY 6 HOURS AS NEEDED FOR STOMACH [place]     Gastroenterology:  Antispasmodic Agents Passed - 08/14/2021  2:41 PM      Passed - Valid encounter within last 12 months    Recent Outpatient Visits           6 days ago Acute non-recurrent frontal sinusitis   [facility] Birdie Sons, MD   3 weeks ago Fatigue due to exposure, sequela   [facility] Gwyneth Sprout, FNP   3 weeks ago [place], Donald E, MD   1 month ago Sore throat   [facility] Birdie Sons, MD   2 months ago Urinary frequency   [place], MD                cetirizine (ZYRTEC) 10 MG tablet 90 tablet 1    Sig: Take 1 tablet (10 mg total) by mouth daily. For allergies     Ear, Nose, and Throat:  Antihistamines 2 Passed - 08/14/2021  3:44 PM      Passed - Cr in normal range and within 360 days    Creatinine  Date Value Ref Range Status  10/02/2013 0.85 0.60 - 1.30 mg/dL Final   Creatinine, Ser  Date Value Ref Range Status  11/02/2020 0.64 0.44 - 1.00 mg/dL Final         Passed - Valid encounter within last 12 months    Recent Outpatient Visits           6 days ago Acute non-recurrent frontal sinusitis   [facility] Birdie Sons, MD   3 weeks ago Fatigue due to exposure, sequela   [facility] Gwyneth Sprout, FNP   3 weeks ago [place], Donald E, MD   1 month ago Sore throat   [facility] Lelon Huh  E, MD   2 months ago Urinary frequency   [place], MD               citalopram (CELEXA) 20 MG tablet 180 tablet 1    Sig: Take 2 tablets (40 mg total) by mouth daily.     Psychiatry:  Antidepressants - SSRI Passed - 08/14/2021  3:44 PM      Passed - Completed PHQ-2 or PHQ-9 in the last 360 days      Passed - Valid encounter within last 6 months    Recent Outpatient Visits           6 days ago Acute non-recurrent frontal sinusitis   [facility] Birdie Sons, MD   3 weeks ago Fatigue due to exposure, sequela   [facility] Gwyneth Sprout, FNP   3 weeks ago [place], Donald E, MD   1 month ago Sore throat   [facility] Birdie Sons, MD   2 months ago Urinary frequency   [facility] Birdie Sons, MD               escitalopram (LEXAPRO) 20 MG tablet 90 tablet 1    Sig: Take 1 tablet (20 mg total) by mouth daily.     Psychiatry:  Antidepressants - SSRI Passed - 08/14/2021  3:44 PM       Passed - Completed PHQ-2 or PHQ-9 in the last 360 days      Passed - Valid encounter within last 6 months    Recent Outpatient Visits           6 days ago Acute non-recurrent frontal sinusitis   [facility] Birdie Sons, MD   3 weeks ago Fatigue due to exposure, sequela   [facility] Gwyneth Sprout, FNP   3 weeks ago [place], Donald E, MD   1 month ago Sore throat   [facility] Birdie Sons, MD   2 months ago Urinary frequency   [place], MD               estradiol (ESTRACE) 0.5 MG tablet 90 tablet 1    Sig: Take 1 tablet (0.5 mg total) by mouth daily.     OB/GYN:  Estrogens Passed - 08/14/2021  3:44 PM      Passed - Mammogram is up-to-date per Health Maintenance      Passed - Last BP in normal range    BP Readings from Last 1 Encounters:  08/08/21 (!) 93/54         Passed - Valid encounter within last 12 months    Recent Outpatient Visits           6 days ago Acute non-recurrent frontal sinusitis   [facility] Birdie Sons, MD   3 weeks ago Fatigue due to exposure, sequela   [facility] Gwyneth Sprout, FNP   3 weeks ago [place], Donald E, MD   1 month ago Sore throat   [facility] Birdie Sons, MD   2 months ago Urinary frequency   [place], MD              Refused Prescriptions Disp Refills   nortriptyline (PAMELOR) 25 MG capsule 90 capsule 1  Psychiatry:  Antidepressants - Heterocyclics (TCAs) Passed - 08/14/2021  3:44 PM      Passed - Completed PHQ-2 or PHQ-9 in the last 360 days      Passed - Valid encounter within last 6 months    Recent Outpatient Visits           6 days ago Acute non-recurrent frontal sinusitis   [facility] Birdie Sons, MD   3 weeks ago Fatigue due to  exposure, sequela   [facility] Gwyneth Sprout, FNP   3 weeks ago [place], Donald E, MD   1 month ago Sore throat   [facility] Birdie Sons, MD   2 months ago Urinary frequency   [facility] Birdie Sons, MD               pantoprazole (PROTONIX) 40 MG tablet 90 tablet 3    Sig: Take 1 tablet (40 mg total) by mouth daily. For acid reflux     Gastroenterology: Proton Pump Inhibitors Passed - 08/14/2021  3:44 PM      Passed - Valid encounter within last 12 months    Recent Outpatient Visits           6 days ago Acute non-recurrent frontal sinusitis   [facility] Birdie Sons, MD   3 weeks ago Fatigue due to exposure, sequela   [facility] Gwyneth Sprout, FNP   3 weeks ago [place], Donald E, MD   1 month ago Sore throat   [facility] Birdie Sons, MD   2 months ago Urinary frequency   [facility] Birdie Sons, MD

## 2021-08-14 NOTE — Telephone Encounter (Signed)
Nortriptyline and pantoprazole but were sent in 05/17/21 with refills.  Requested Prescriptions  Pending Prescriptions Disp Refills  . cetirizine (ZYRTEC) 10 MG tablet 90 tablet 1    Sig: Take 1 tablet (10 mg total) by mouth daily. For allergies     Ear, Nose, and Throat:  Antihistamines 2 Passed - 08/14/2021  3:44 PM      Passed - Cr in normal range and within 360 days    Creatinine  Date Value Ref Range Status  10/02/2013 0.85 0.60 - 1.30 mg/dL Final   Creatinine, Ser  Date Value Ref Range Status  11/02/2020 0.64 0.44 - 1.00 mg/dL Final         Passed - Valid encounter within last 12 months    Recent Outpatient Visits          6 days ago Acute non-recurrent frontal sinusitis   [facility] Birdie Sons, MD   3 weeks ago Fatigue due to exposure, sequela   [facility] Gwyneth Sprout, FNP   3 weeks ago [place], Donald E, MD   1 month ago Sore throat   [place], MD   2 months ago Urinary frequency   [facility] Birdie Sons, MD             . citalopram (CELEXA) 20 MG tablet 180 tablet 1    Sig: Take 2 tablets (40 mg total) by mouth daily.     Psychiatry:  Antidepressants - SSRI Passed - 08/14/2021  3:44 PM      Passed - Completed PHQ-2 or PHQ-9 in the last 360 days      Passed - Valid encounter within last 6 months    Recent Outpatient Visits          6 days ago Acute non-recurrent frontal sinusitis   [facility] Birdie Sons, MD   3 weeks ago Fatigue due to exposure, sequela   [facility] Gwyneth Sprout, FNP   3 weeks ago [place], Donald E, MD   1 month ago Sore throat   [facility] Birdie Sons, MD   2 months ago Urinary frequency   [facility] Birdie Sons, MD             . escitalopram (LEXAPRO) 20 MG tablet 90 tablet 1    Sig:  Take 1 tablet (20 mg total) by mouth daily.     Psychiatry:  Antidepressants - SSRI Passed - 08/14/2021  3:44 PM      Passed - Completed PHQ-2 or PHQ-9 in the last 360 days      Passed - Valid encounter within last 6 months    Recent Outpatient Visits          6 days ago Acute non-recurrent frontal sinusitis   [facility] Birdie Sons, MD   3 weeks ago Fatigue due to exposure, sequela   [facility] Gwyneth Sprout, FNP   3 weeks ago [place], Donald E, MD   1 month ago Sore throat   [facility] Birdie Sons, MD   2 months ago Urinary frequency   [facility] Birdie Sons, MD             . estradiol (ESTRACE) 0.5 MG tablet 90 tablet 1    Sig:  Take 1 tablet (0.5 mg total) by mouth daily.     OB/GYN:  Estrogens Passed - 08/14/2021  3:44 PM      Passed - Mammogram is up-to-date per Health Maintenance      Passed - Last BP in normal range    BP Readings from Last 1 Encounters:  08/08/21 (!) 93/54         Passed - Valid encounter within last 12 months    Recent Outpatient Visits          6 days ago Acute non-recurrent frontal sinusitis   [facility] Birdie Sons, MD   3 weeks ago Fatigue due to exposure, sequela   [facility] Gwyneth Sprout, FNP   3 weeks ago [place] Donald E, MD   1 month ago Sore throat   [facility] Birdie Sons, MD   2 months ago Urinary frequency   [facility] Birdie Sons, MD             . zolpidem (AMBIEN) 10 MG tablet 30 tablet 1    Sig: TAKE 1 TABLET BY MOUTH EVERYDAY AT BEDTIME     Not Delegated - Psychiatry:  Anxiolytics/Hypnotics Failed - 08/14/2021  3:44 PM      Failed - This refill cannot be delegated      Failed - Urine Drug Screen completed in last 360 days      Passed - Valid encounter within last 6 months    Recent  Outpatient Visits          6 days ago Acute non-recurrent frontal sinusitis   [facility] Birdie Sons, MD   3 weeks ago Fatigue due to exposure, sequela   [facility] Gwyneth Sprout, FNP   3 weeks ago [place], Donald E, MD   1 month ago Sore throat   [facility] Birdie Sons, MD   2 months ago Urinary frequency   [place], MD             Signed Prescriptions Disp Refills   hyoscyamine (LEVSIN) 0.125 MG tablet 20 tablet 1    Sig: TAKE ONE TABLET BY MOUTH EVERY 6 HOURS AS NEEDED FOR STOMACH [place]     Gastroenterology:  Antispasmodic Agents Passed - 08/14/2021  2:41 PM      Passed - Valid encounter within last 12 months    Recent Outpatient Visits          6 days ago Acute non-recurrent frontal sinusitis   [facility] Birdie Sons, MD   3 weeks ago Fatigue due to exposure, sequela   [facility] Gwyneth Sprout, FNP   3 weeks ago [place], Donald E, MD   1 month ago Sore throat   [facility] Birdie Sons, MD   2 months ago Urinary frequency   [facility] Birdie Sons, MD             Refused Prescriptions Disp Refills  . nortriptyline (PAMELOR) 25 MG capsule 90 capsule 1     Psychiatry:  Antidepressants - Heterocyclics (TCAs) Passed - 08/14/2021  3:44 PM      Passed - Completed PHQ-2 or PHQ-9 in the last 360 days      Passed - Valid encounter within last 6 months  Recent Outpatient Visits          6 days ago Acute non-recurrent frontal sinusitis   [facility] Birdie Sons, MD   3 weeks ago Fatigue due to exposure, sequela   [facility] Gwyneth Sprout, FNP   3 weeks ago [place], Donald E, MD   1 month ago Sore throat   [place],  MD   2 months ago Urinary frequency   [facility] Birdie Sons, MD             . pantoprazole (PROTONIX) 40 MG tablet 90 tablet 3    Sig: Take 1 tablet (40 mg total) by mouth daily. For acid reflux     Gastroenterology: Proton Pump Inhibitors Passed - 08/14/2021  3:44 PM      Passed - Valid encounter within last 12 months    Recent Outpatient Visits          6 days ago Acute non-recurrent frontal sinusitis   [facility] Birdie Sons, MD   3 weeks ago Fatigue due to exposure, sequela   [facility] Gwyneth Sprout, FNP   3 weeks ago [place], Donald E, MD   1 month ago Sore throat   [facility] Birdie Sons, MD   2 months ago Urinary frequency   [facility] Birdie Sons, MD

## 2021-08-17 MED ORDER — ZOLPIDEM TARTRATE 10 MG PO TABS: ORAL_TABLET | ORAL | 3 refills | Status: DC

## 2021-08-27 ENCOUNTER — Other Ambulatory Visit: Payer: Self-pay | Admitting: Family Medicine

## 2021-08-27 DIAGNOSIS — G43809 Other migraine, not intractable, without status migrainosus: Secondary | ICD-10-CM

## 2021-09-25 NOTE — Progress Notes (Deleted)
I,Jana Teriah Muela,acting as a Education administrator for [employer], PA-C.,have documented all relevant documentation on the behalf of Mardene Speak, PA-C,as directed by  [employer], PA-C while in the presence of [employer], PA-C.   MyChart Video Visit    Virtual Visit via Video Note   This visit type was conducted due to national recommendations for restrictions regarding the COVID-19 Pandemic (e.g. social distancing) in an effort to limit this patient's exposure and mitigate transmission in our community. This patient is at least at moderate risk for complications without adequate follow up. This format is felt to be most appropriate for this patient at this time. Physical exam was limited by quality of the video and audio technology used for the visit.   Patient location: home  Provider location: [facility]   I discussed the limitations of evaluation and management by telemedicine and the availability of in person appointments. The patient expressed understanding and agreed to proceed.  Patient: Erin Richards   DOB: 01-25-1966   56 y.o. Female  MRN: [id number] Visit Date: 09/26/2021  Today's healthcare provider: Mardene Speak, PA-C   No chief complaint on file.  Subjective       Medications: Outpatient Medications Prior to Visit  Medication Sig   ACCU-CHEK GUIDE test strip USE UP TO FOUR TIMES DAILY AS DIRECTED.   Accu-Chek Softclix Lancets lancets Use to check blood sugar daily   ALPRAZolam (XANAX) 0.5 MG tablet Take 0.5-1 tablets (0.25-0.5 mg total) by mouth 2 (two) times daily as needed (pain attacks).   AMBULATORY NON FORMULARY MEDICATION Automatic brachial blood pressure monitor.   Dx: Labile blood pressure, dizziness   Azelastine HCl 137 MCG/SPRAY SOLN PLACE 1 SPRAY INTO BOTH NOSTRILS 2 (TWO) TIMES DAILY   Blood Glucose Monitoring Suppl (ACCU-CHEK GUIDE) w/Device KIT Use daily to check blood sugars   cetirizine (ZYRTEC) 10 MG tablet Take 1 tablet (10 mg total) by mouth daily. For  allergies   citalopram (CELEXA) 20 MG tablet Take 2 tablets (40 mg total) by mouth daily.   clonazePAM (KLONOPIN) 0.5 MG tablet Take 0.5 mg by mouth daily as needed.   clotrimazole-betamethasone (LOTRISONE) cream Apply to affected area 2 times daily prn   COMBIVENT RESPIMAT 20-100 MCG/ACT AERS respimat INHALE 1 PUFF INTO THE LUNGS EVERY 4 HOURS AS NEEDED FOR WHEEZING.   escitalopram (LEXAPRO) 20 MG tablet Take 1 tablet (20 mg total) by mouth daily.   estradiol (ESTRACE) 0.5 MG tablet Take 1 tablet (0.5 mg total) by mouth daily.   fluticasone (FLONASE) 50 MCG/ACT nasal spray PLACE 2 SPRAYS INTO BOTH NOSTRILS DAILY AS NEEDED FOR ALLERGIES OR RHINITIS.   hydrocortisone (ANUSOL-HC) 25 MG suppository Place 1 suppository (25 mg total) rectally 2 (two) times daily.   hyoscyamine (LEVSIN) 0.125 MG tablet TAKE ONE TABLET BY MOUTH EVERY 6 HOURS AS NEEDED FOR STOMACH CRAMPINGS   ipratropium (ATROVENT) 0.03 % nasal spray PLACE 2 SPRAYS INTO THE NOSE DAILY AS NEEDED.   linaclotide (LINZESS) 145 MCG CAPS capsule Take 1 capsule (145 mcg total) by mouth daily before breakfast.   montelukast (SINGULAIR) 10 MG tablet TAKE 1 TABLET BY MOUTH EVERYDAY AT BEDTIME   Multiple Vitamin (MULTIVITAMIN WITH MINERALS) TABS tablet Take 2 tablets by mouth daily.   naproxen (NAPROSYN) 500 MG tablet TAKE 1 TABLET (500 MG TOTAL) BY MOUTH 2 (TWO) TIMES DAILY WITH A MEAL. AS NEEDED FOR PAIN   nortriptyline (PAMELOR) 25 MG capsule TAKE ONE CAPSULE AT BEDTIME FOR MIGRANE PREVENTION AND IRRITABLE BOWEL SYNDROME  ondansetron (ZOFRAN-ODT) 4 MG disintegrating tablet Take 1 tablet (4 mg total) by mouth every 8 (eight) hours as needed for nausea or vomiting.   OXYGEN Inhale 2 L into the lungs at bedtime as needed.   pantoprazole (PROTONIX) 40 MG tablet Take 1 tablet (40 mg total) by mouth daily. For acid reflux   promethazine (PHENERGAN) 12.5 MG tablet Take 1 tablet (12.5 mg total) by mouth every 8 (eight) hours as needed for nausea or  vomiting.   Spacer/Aero-Holding Chambers (AEROCHAMBER PLUS) inhaler Use with inhaler   VENTOLIN HFA 108 (90 Base) MCG/ACT inhaler Inhale 2 puffs into the lungs every 6 (six) hours as needed for wheezing or shortness of breath.   zolpidem (AMBIEN) 10 MG tablet TAKE 1 TABLET BY MOUTH EVERYDAY AT BEDTIME   No facility-administered medications prior to visit.    Review of Systems  {Labs  Heme  Chem  Endocrine  Serology  Results Review (optional):23779}   Objective    There were no vitals taken for this visit.  {Show previous vital signs (optional):23777}   Physical Exam     Assessment & Plan     ***  No follow-ups on file.     I discussed the assessment and treatment plan with the patient. The patient was provided an opportunity to ask questions and all were answered. The patient agreed with the plan and demonstrated an understanding of the instructions.   The patient was advised to call back or seek an in-person evaluation if the symptoms worsen or if the condition fails to improve as anticipated.  I provided *** minutes of non-face-to-face time during this encounter.  {provider attestation***:1}  Mardene Speak, [facility] [phone] (phone) [phone] (fax)  [place]

## 2021-09-26 ENCOUNTER — Other Ambulatory Visit: Payer: Self-pay

## 2021-09-26 ENCOUNTER — Encounter: Payer: Medicaid Other | Admitting: Physician Assistant

## 2021-09-26 DIAGNOSIS — R55 Syncope and collapse: Secondary | ICD-10-CM

## 2021-09-26 DIAGNOSIS — J4540 Moderate persistent asthma, uncomplicated: Secondary | ICD-10-CM

## 2021-09-26 DIAGNOSIS — K5900 Constipation, unspecified: Secondary | ICD-10-CM

## 2021-09-26 MED ORDER — COMBIVENT RESPIMAT 20-100 MCG/ACT IN AERS
INHALATION_SPRAY | RESPIRATORY_TRACT | 1 refills | Status: DC
Start: 2021-09-26 — End: 2022-03-03

## 2021-10-07 ENCOUNTER — Other Ambulatory Visit: Payer: Self-pay | Admitting: Family Medicine

## 2021-10-07 DIAGNOSIS — J302 Other seasonal allergic rhinitis: Secondary | ICD-10-CM

## 2021-10-22 ENCOUNTER — Other Ambulatory Visit: Payer: Self-pay | Admitting: Family Medicine

## 2021-10-22 DIAGNOSIS — Z1231 Encounter for screening mammogram for malignant neoplasm of breast: Secondary | ICD-10-CM

## 2021-10-27 ENCOUNTER — Encounter: Payer: Self-pay | Admitting: Family Medicine

## 2021-10-27 ENCOUNTER — Ambulatory Visit: Payer: Medicaid Other | Admitting: Family Medicine

## 2021-10-27 ENCOUNTER — Other Ambulatory Visit (HOSPITAL_COMMUNITY)
Admission: RE | Admit: 2021-10-27 | Discharge: 2021-10-27 | Disposition: A | Discharge status: Home / Self Care | Payer: Medicaid Other | Source: Ambulatory Visit | Attending: Family Medicine | Admitting: Family Medicine

## 2021-10-27 VITALS — BP 114/67 | HR 70 | Resp 16 | Ht <= 58 in | Wt 139.0 lb

## 2021-10-27 DIAGNOSIS — G43809 Other migraine, not intractable, without status migrainosus: Secondary | ICD-10-CM | POA: Diagnosis not present

## 2021-10-27 DIAGNOSIS — Z124 Encounter for screening for malignant neoplasm of cervix: Secondary | ICD-10-CM | POA: Diagnosis not present

## 2021-10-27 DIAGNOSIS — N898 Other specified noninflammatory disorders of vagina: Secondary | ICD-10-CM | POA: Insufficient documentation

## 2021-10-27 MED ORDER — UBRELVY 50 MG PO TABS: 50.0000 mg | ORAL_TABLET | Freq: Once | ORAL | 1 refills | Status: DC | PRN

## 2021-10-27 NOTE — Progress Notes (Unsigned)
    SUBJECTIVE:   CHIEF COMPLAINT / HPI:   MIGRAINES - h/o chronic migraines, typically well controlled with nortriptyline. Got new job working in [employer], has noticed increase in migraines since.  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  - denies stress at work.   Duration: chronic Quality: throbbing Location: frontal, radiates around to suboccipital, bilateral. Alleviating factors: rest, dark room. Aggravating factors: bright lights, loud noise Headache status at time of visit: asymptomatic Treatments attempted: tylenol   Aura: no Nausea:  yes Vomiting: no Photophobia:  yes Phonophobia:  yes Effect on social functioning:  yes Numbers of missed days of school/work each month: 0 Confusion:  no Gait disturbance/ataxia:  no Behavioral changes:  no Fevers:  no  VAGINAL DISCHARGE Duration: months Discharge description: white  Pruritus: no Dysuria: no Malodorous: yes Urinary frequency: no Fevers: no Abdominal pain: no  Sexual activity: not sexually active Recent antibiotic use: no Context: none Treatments attempted: metronidazole gel (last used in March), monistat   OBJECTIVE:   BP 114/67 (BP Location: Left Arm, Patient Position: Sitting, Cuff Size: Normal)   Pulse 70   Resp 16   Ht '4\' 9"'$  (1.448 m)   Wt 139 lb (63 kg)   SpO2 99%   BMI 30.08 kg/m   Gen: well appearing, in NAD GYN:  External genitalia within normal limits.  Vaginal mucosa pink, moist, normal rugae.  Nonfriable cervix without lesions, no bleeding noted on speculum exam. Moderate amount of creamy discharge. Bimanual exam revealed normal, nongravid uterus.  No cervical motion tenderness. No adnexal masses bilaterally.      ASSESSMENT/PLAN:   Migraine Chronic with increase recently, ?allergen exposure at work. No documentation of previous allergy testing as was years ago, recommended seeing allergist. Note provided for work  accommodations. Currently without abortive therapy, will trial ubrelvy given h/o TIA and unable to take triptans. Continue nortriptyline.   Vaginal discharge Wet prep obtained, will treat as indicated. Pap almost due, obtained today.   Myles Gip, DO

## 2021-10-28 NOTE — Assessment & Plan Note (Signed)
Chronic with increase recently, ?allergen exposure at work. No documentation of previous allergy testing as was years ago, recommended seeing allergist. Note provided for work accommodations. Currently without abortive therapy, will trial ubrelvy given h/o TIA and unable to take triptans. Continue nortriptyline.

## 2021-10-29 ENCOUNTER — Telehealth: Payer: Self-pay | Admitting: Family Medicine

## 2021-10-29 LAB — CERVICOVAGINAL ANCILLARY ONLY
Bacterial Vaginitis (gardnerella): NEGATIVE
Candida Glabrata: NEGATIVE
Candida Vaginitis: NEGATIVE
Comment: NEGATIVE
Comment: NEGATIVE
Comment: NEGATIVE

## 2021-10-29 NOTE — Telephone Encounter (Signed)
PA started today.  

## 2021-10-29 NOTE — Telephone Encounter (Signed)
[facility] approved. [facility]. Left vm advising patient.

## 2021-10-29 NOTE — Telephone Encounter (Signed)
Pt following up on the prior auth for Ubrogepant (UBRELVY) 50 MG TABS.  Pharmacy told her it needs a prior auth  pt states she really needs b/c she had a really bad migraine last night.  [id number] [place] - [address]

## 2021-10-30 ENCOUNTER — Other Ambulatory Visit: Payer: Self-pay | Admitting: Family Medicine

## 2021-10-30 DIAGNOSIS — G43809 Other migraine, not intractable, without status migrainosus: Secondary | ICD-10-CM

## 2021-10-30 DIAGNOSIS — J302 Other seasonal allergic rhinitis: Secondary | ICD-10-CM

## 2021-10-30 NOTE — Telephone Encounter (Signed)
Rx 05/20/21 #90 1RF- 6 month supply Requested Prescriptions  Pending Prescriptions Disp Refills  . fluticasone (FLONASE) 50 MCG/ACT nasal spray [Pharmacy Med Name: FLUTICASONE PROP 50 MCG SPRAY] 48 mL 1    Sig: PLACE 2 SPRAYS INTO BOTH NOSTRILS DAILY AS NEEDED FOR ALLERGIES OR RHINITIS.     Ear, Nose, and Throat: Nasal Preparations - Corticosteroids Passed - 10/30/2021  3:37 PM      Passed - Valid encounter within last 12 months    Recent Outpatient Visits          3 days ago Vaginal discharge   [place], Jake Church, DO   1 month ago    [employer], [place], PA-C   2 months ago Acute non-recurrent frontal sinusitis   [facility] Birdie Sons, MD   3 months ago Fatigue due to exposure, sequela   [facility] Gwyneth Sprout, FNP   3 months ago [place], MD      Future Appointments            In 3 weeks Caryn Section, Kirstie Peri, MD [facility]           . nortriptyline (PAMELOR) 25 MG capsule [Pharmacy Med Name: NORTRIPTYLINE HCL 25 MG CAP] 90 capsule 0    Sig: TAKE ONE CAPSULE AT BEDTIME FOR MIGRANE PREVENTION AND IRRITABLE BOWEL SYNDROME     Psychiatry:  Antidepressants - Heterocyclics (TCAs) Passed - 10/30/2021  3:37 PM      Passed - Completed PHQ-2 or PHQ-9 in the last 360 days      Passed - Valid encounter within last 6 months    Recent Outpatient Visits          3 days ago Vaginal discharge   [facility] Myles Gip, DO   1 month ago    [employer], [place], PA-C   2 months ago Acute non-recurrent frontal sinusitis   [facility] Birdie Sons, MD   3 months ago Fatigue due to exposure, sequela   [facility] Gwyneth Sprout, FNP   3 months ago [place], Donald E, MD      Future Appointments            In 3 weeks Fisher,  Kirstie Peri, MD [facility], [place]

## 2021-10-30 NOTE — Telephone Encounter (Signed)
Requested medication (s) are due for refill today - expired Rx  Requested medication (s) are on the active medication list -yes  Future visit scheduled -yes  Last refill: 09/21/20 84m 1RF  Notes to clinic: expired Rx  Requested Prescriptions  Pending Prescriptions Disp Refills   fluticasone (FLONASE) 50 MCG/ACT nasal spray [Pharmacy Med Name: FLUTICASONE PROP 50 MCG SPRAY] 48 mL 1    Sig: PLACE 2 SPRAYS INTO BOTH NOSTRILS DAILY AS NEEDED FOR ALLERGIES OR RHINITIS.     Ear, Nose, and Throat: Nasal Preparations - Corticosteroids Passed - 10/30/2021  3:37 PM      Passed - Valid encounter within last 12 months    Recent Outpatient Visits           3 days ago Vaginal discharge   [place] AJ[place] DO   1 month ago    [facility] [place] PA-C   2 months ago Acute non-recurrent frontal sinusitis   [facility]   3 months ago Fatigue due to exposure, sequela   [facility]   3 months ago DOld [place] MD       Future Appointments             In 3 weeks FCaryn Section DKirstie Peri [facility]            Refused Prescriptions Disp Refills   nortriptyline (PAMELOR) 25 MG capsule [Pharmacy Med Name: NORTRIPTYLINE HCL 25 MG CAP] 90 capsule 0    Sig: TAKE ONE CAPSULE AT BEDTIME FOR MIGRANE PREVENTION AND IRRITABLE BOWEL SYNDROME     Psychiatry:  Antidepressants - Heterocyclics (TCAs) Passed - 10/30/2021  3:37 PM      Passed - Completed PHQ-2 or PHQ-9 in the last 360 days      Passed - Valid encounter within last 6 months    Recent Outpatient Visits           3 days ago [facility]aginal [facility]   [facility]   1 month ago    [employer] [place] PA-C   2 months ago Acute non-recurrent frontal sinusitis   [facility]    3 months ago Fatigue due to exposure, sequela   [facility]   3 months ago [place] MD       Future Appointments             In 3 weeks FCaryn Section DKirstie Peri [facility]               Requested Prescriptions  Pending Prescriptions Disp Refills   fluticasone (FLONASE) 50 MCG/ACT nasal spray [Pharmacy Med Name: FLUTICASONE PROP 50 MCG SPRAY] 48 mL 1    Sig: PLACE 2 SPRAYS INTO BOTH NOSTRILS DAILY AS NEEDED FOR ALLERGIES OR RHINITIS.     Ear, Nose, and Throat: Nasal Preparations - Corticosteroids Passed - 10/30/2021  3:37 PM      Passed - Valid encounter within last 12 months    Recent Outpatient Visits           3 days ago Vaginal discharge   [facility]   1 month ago    [place]-C   2 months ago  Acute non-recurrent frontal sinusitis   [facility] Birdie Sons, MD   3 months ago Fatigue due to exposure, sequela   [facility] Gwyneth Sprout, FNP   3 months ago [place], MD       Future Appointments             In 3 weeks Caryn Section, Kirstie Peri, MD [facility], [facility]            Refused Prescriptions Disp Refills   nortriptyline (PAMELOR) 25 MG capsule [Pharmacy Med Name: NORTRIPTYLINE HCL 25 MG CAP] 90 capsule 0    Sig: TAKE ONE CAPSULE AT BEDTIME FOR MIGRANE PREVENTION AND IRRITABLE BOWEL SYNDROME     Psychiatry:  Antidepressants - Heterocyclics (TCAs) Passed - 10/30/2021  3:37 PM      Passed - Completed PHQ-2 or PHQ-9 in the last 360 days      Passed - Valid encounter within last 6 months    Recent Outpatient Visits           3 days ago Vaginal discharge   [facility] Myles Gip, DO   1 month ago    [employer], [place], PA-C   2 months ago Acute  non-recurrent frontal sinusitis   [facility] Birdie Sons, MD   3 months ago Fatigue due to exposure, sequela   [facility] Gwyneth Sprout, FNP   3 months ago [place], Donald E, MD       Future Appointments             In 3 weeks Fisher, Kirstie Peri, MD [facility], [place]

## 2021-11-03 ENCOUNTER — Encounter: Payer: Self-pay | Admitting: Family Medicine

## 2021-11-03 LAB — CYTOLOGY - PAP
Chlamydia: NEGATIVE
Comment: NEGATIVE
Comment: NEGATIVE
Comment: NEGATIVE
Comment: NORMAL
High risk HPV: POSITIVE — AB
Neisseria Gonorrhea: NEGATIVE
Trichomonas: NEGATIVE

## 2021-11-04 ENCOUNTER — Other Ambulatory Visit: Payer: Self-pay

## 2021-11-04 DIAGNOSIS — R87618 Other abnormal cytological findings on specimens from cervix uteri: Secondary | ICD-10-CM

## 2021-11-04 NOTE — Progress Notes (Signed)
Spoke with patient and advised

## 2021-11-04 NOTE — Addendum Note (Signed)
Addended by: Myles Gip on: 11/04/2021 04:20 PM   Modules accepted: Orders

## 2021-11-06 ENCOUNTER — Ambulatory Visit
Admission: RE | Admit: 2021-11-06 | Discharge: 2021-11-06 | Disposition: A | Discharge status: Home / Self Care | Payer: Medicaid Other | Source: Ambulatory Visit | Attending: Family Medicine | Admitting: Family Medicine

## 2021-11-06 ENCOUNTER — Ambulatory Visit: Payer: Self-pay

## 2021-11-06 ENCOUNTER — Telehealth: Payer: Self-pay

## 2021-11-06 ENCOUNTER — Ambulatory Visit: Payer: Medicaid Other | Admitting: Family Medicine

## 2021-11-06 ENCOUNTER — Encounter: Payer: Self-pay | Admitting: Family Medicine

## 2021-11-06 ENCOUNTER — Ambulatory Visit: Payer: Medicaid Other

## 2021-11-06 VITALS — BP 98/62 | HR 66 | Temp 97.7°F | Resp 16 | Ht <= 58 in | Wt 137.0 lb

## 2021-11-06 DIAGNOSIS — A084 Viral intestinal infection, unspecified: Secondary | ICD-10-CM | POA: Diagnosis present

## 2021-11-06 DIAGNOSIS — R1012 Left upper quadrant pain: Secondary | ICD-10-CM | POA: Diagnosis present

## 2021-11-06 MED ORDER — SUCRALFATE 1 G PO TABS: 1.0000 g | ORAL_TABLET | Freq: Three times a day (TID) | ORAL | 0 refills | Status: DC

## 2021-11-06 NOTE — Telephone Encounter (Signed)
  Chief Complaint: Vomiting/diarrhea Symptoms: ibid Frequency: 2.5 days Pertinent Negatives: Patient denies  Disposition: '[]'$ ED /'[]'$ Urgent Care (no appt availability in office) / '[]'$ Appointment(In office/virtual)/ '[]'$  [facility]/ '[]'$ Home Care/ '[]'$ Refused Recommended Disposition /'[]'$ [place] [facility]/ '[]'$  Follow-up with PCP Additional Notes: Pt has had vomiting and diarrhea since Tuesday night. PT states that every time she eats, she vomits the food. PT also reports diarrhea, abdominal pain with the diarrhea and chills.   Summary: diarrhea/ vomiting   Pt states she has diarrhea and unable to keep food down more than 15 minutes w/o vomiting x3d   Please fu w/pt           Reason for Disposition  [1] MILD vomiting with diarrhea AND [2] present > 5 days  Answer Assessment - Initial Assessment Questions 1. VOMITING SEVERITY: "How many times have you vomited in the past 24 hours?"     - MILD:  1 - 2 times/day    - MODERATE: 3 - 5 times/day, decreased oral intake without significant weight loss or symptoms of dehydration    - SEVERE: 6 or more times/day, vomits everything or nearly everything, with significant weight loss, symptoms of dehydration      Moderate 2. ONSET: "When did the vomiting begin?"      Tuesday  3. FLUIDS: "What fluids or food have you vomited up today?" "Have you been able to keep any fluids down?"     Yes fluids 4. ABDOMEN PAIN: "Are your having any abdomen pain?" If Yes : "How bad is it and what does it feel like?" (e.g., crampy, dull, intermittent, constant)      Pain with diarrhea 5. DIARRHEA: "Is there any diarrhea?" If Yes, ask: "How many times today?"      yes 6. CONTACTS: "Is there anyone else in the family with the same symptoms?"      no 7. CAUSE: "What do you think is causing your vomiting?"     Unsure 8. HYDRATION STATUS: "Any signs of dehydration?" (e.g., dry mouth [not only dry lips], too weak to stand) "When did you last urinate?"     Dry  mouth weakness 9. OTHER SYMPTOMS: "Do you have any other symptoms?" (e.g., fever, headache, vertigo, vomiting blood or coffee grounds, recent head injury)     No - chills 10. PREGNANCY: "Is there any chance you are pregnant?" "When was your last menstrual period?"  Protocols used: Vomiting-A-AH

## 2021-11-06 NOTE — Assessment & Plan Note (Signed)
Acute, symptoms started Tuesday evening after dinner. Able to tolerate liquids. No travel, no sick contacts. Works in a school.

## 2021-11-06 NOTE — Telephone Encounter (Signed)
If any blood in stool or emesis, fever, or moderate to severe abdominal pain then she needs to go to ER. Otherwise stick to clear liquids only for the next 12 hours, then bland solids only. If sx continue then go to ER.

## 2021-11-06 NOTE — Progress Notes (Signed)
Established patient visit   Patient: Erin Richards   DOB: 1965/04/16   56 y.o. Female  MRN: [id number] Visit Date: 11/06/2021  Today's healthcare provider: Gwyneth Sprout, FNP  Re Introduced to nurse practitioner role and practice setting.  All questions answered.  Discussed provider/patient relationship and expectations.   I,Tiffany J Bragg,acting as a scribe for Gwyneth Sprout, FNP.,have documented all relevant documentation on the behalf of Gwyneth Sprout, FNP,as directed by  Gwyneth Sprout, FNP while in the presence of Gwyneth Sprout, FNP.   Chief Complaint  Patient presents with   Diarrhea    Patient complains of diarrhea and vomiting for 2 days after every meal. Is able to keep fluids down.    Subjective    HPI HPI     Diarrhea    Additional comments: Patient complains of diarrhea and vomiting for 2 days after every meal. Is able to keep fluids down.       Last edited by Smitty Knudsen, CMA on 11/06/2021  9:07 AM.       Medications: Outpatient Medications Prior to Visit  Medication Sig   ACCU-CHEK GUIDE test strip USE UP TO FOUR TIMES DAILY AS DIRECTED.   Accu-Chek Softclix Lancets lancets Use to check blood sugar daily   ALPRAZolam (XANAX) 0.5 MG tablet Take 0.5-1 tablets (0.25-0.5 mg total) by mouth 2 (two) times daily as needed (pain attacks).   AMBULATORY NON FORMULARY MEDICATION Automatic brachial blood pressure monitor.   Dx: Labile blood pressure, dizziness   Azelastine HCl 137 MCG/SPRAY SOLN PLACE 1 SPRAY INTO BOTH NOSTRILS 2 (TWO) TIMES DAILY   Blood Glucose Monitoring Suppl (ACCU-CHEK GUIDE) w/Device KIT Use daily to check blood sugars   cetirizine (ZYRTEC) 10 MG tablet Take 1 tablet (10 mg total) by mouth daily. For allergies   citalopram (CELEXA) 20 MG tablet Take 2 tablets (40 mg total) by mouth daily.   clonazePAM (KLONOPIN) 0.5 MG tablet Take 0.5 mg by mouth daily as needed.   clotrimazole-betamethasone (LOTRISONE) cream Apply to affected area 2  times daily prn   escitalopram (LEXAPRO) 20 MG tablet Take 1 tablet (20 mg total) by mouth daily.   estradiol (ESTRACE) 0.5 MG tablet Take 1 tablet (0.5 mg total) by mouth daily.   fluticasone (FLONASE) 50 MCG/ACT nasal spray PLACE 2 SPRAYS INTO BOTH NOSTRILS DAILY AS NEEDED FOR ALLERGIES OR RHINITIS.   hydrocortisone (ANUSOL-HC) 25 MG suppository Place 1 suppository (25 mg total) rectally 2 (two) times daily.   hyoscyamine (LEVSIN) 0.125 MG tablet TAKE ONE TABLET BY MOUTH EVERY 6 HOURS AS NEEDED FOR STOMACH CRAMPINGS   ipratropium (ATROVENT) 0.03 % nasal spray PLACE 2 SPRAYS INTO THE NOSE DAILY AS NEEDED.   Ipratropium-Albuterol (COMBIVENT RESPIMAT) 20-100 MCG/ACT AERS respimat INHALE 1 PUFF INTO THE LUNGS EVERY 4 HOURS AS NEEDED FOR WHEEZING.   linaclotide (LINZESS) 145 MCG CAPS capsule Take 1 capsule (145 mcg total) by mouth daily before breakfast.   montelukast (SINGULAIR) 10 MG tablet TAKE 1 TABLET BY MOUTH EVERYDAY AT BEDTIME   Multiple Vitamin (MULTIVITAMIN WITH MINERALS) TABS tablet Take 2 tablets by mouth daily.   naproxen (NAPROSYN) 500 MG tablet TAKE 1 TABLET (500 MG TOTAL) BY MOUTH 2 (TWO) TIMES DAILY WITH A MEAL. AS NEEDED FOR PAIN   nortriptyline (PAMELOR) 25 MG capsule TAKE ONE CAPSULE AT BEDTIME FOR MIGRANE PREVENTION AND IRRITABLE BOWEL SYNDROME   ondansetron (ZOFRAN-ODT) 4 MG disintegrating tablet Take 1 tablet (4 mg total)  by mouth every 8 (eight) hours as needed for nausea or vomiting.   OXYGEN Inhale 2 L into the lungs at bedtime as needed.   pantoprazole (PROTONIX) 40 MG tablet Take 1 tablet (40 mg total) by mouth daily. For acid reflux   promethazine (PHENERGAN) 12.5 MG tablet Take 1 tablet (12.5 mg total) by mouth every 8 (eight) hours as needed for nausea or vomiting.   Spacer/Aero-Holding Chambers (AEROCHAMBER PLUS) inhaler Use with inhaler   Ubrogepant (UBRELVY) 50 MG TABS Take 50 mg by mouth once as needed for up to 1 dose (migraines, headaches).   VENTOLIN HFA 108 (90  Base) MCG/ACT inhaler Inhale 2 puffs into the lungs every 6 (six) hours as needed for wheezing or shortness of breath.   zolpidem (AMBIEN) 10 MG tablet TAKE 1 TABLET BY MOUTH EVERYDAY AT BEDTIME   No facility-administered medications prior to visit.    Review of Systems    Objective    BP 98/62 (BP Location: Left Arm, Patient Position: Sitting, Cuff Size: Normal)   Pulse 66   Temp 97.7 F (36.5 C) (Oral)   Resp 16   Ht _0  (1.448 m)   Wt 137 lb (62.1 kg)   SpO2 98%   BMI 29.65 kg/m   Physical Exam Vitals and nursing note reviewed.  Constitutional:      General: She is not in acute distress.    Appearance: Normal appearance. She is overweight. She is not ill-appearing, toxic-appearing or diaphoretic.  HENT:     Head: Normocephalic and atraumatic.  Cardiovascular:     Rate and Rhythm: Normal rate and regular rhythm.     Pulses: Normal pulses.     Heart sounds: Normal heart sounds. No murmur heard.    No friction rub. No gallop.  Pulmonary:     Effort: Pulmonary effort is normal. No respiratory distress.     Breath sounds: Normal breath sounds. No stridor. No wheezing, rhonchi or rales.  Chest:     Chest wall: No tenderness.  Abdominal:     General: Bowel sounds are normal.     Palpations: Abdomen is soft.     Tenderness: There is abdominal tenderness in the epigastric area and left upper quadrant. There is no guarding or rebound.     Hernia: No hernia is present.  Musculoskeletal:        General: No swelling, tenderness, deformity or signs of injury. Normal range of motion.     Right lower leg: No edema.     Left lower leg: No edema.  Skin:    General: Skin is warm and dry.     Capillary Refill: Capillary refill takes less than 2 seconds.     Coloration: Skin is not jaundiced or pale.     Findings: No bruising, erythema, lesion or rash.  Neurological:     General: No focal deficit present.     Mental Status: She is alert and oriented to person, place, and time.  Mental status is at baseline.     Cranial Nerves: No cranial nerve deficit.     Sensory: No sensory deficit.     Motor: No weakness.     Coordination: Coordination normal.  Psychiatric:        Mood and Affect: Mood normal.        Behavior: Behavior normal.        Thought Content: Thought content normal.        Judgment: Judgment normal.      No  results found for any visits on 11/06/21.  Assessment & Plan     Problem List Items Addressed This Visit       Digestive   Viral gastroenteritis - Primary    Acute, symptoms started Tuesday evening after dinner. Able to tolerate liquids. No travel, no sick contacts. Works in a school.       Relevant Medications   sucralfate (CARAFATE) 1 g tablet   Other Relevant Orders   CBC with Differential/Platelet   Comprehensive Metabolic Panel (CMET)   Hepatic function panel   US Abdomen Complete     Other   Left upper quadrant abdominal pain    Acute Lupper quad pain with NVD; unknown cause      Relevant Medications   sucralfate (CARAFATE) 1 g tablet   Other Relevant Orders   CBC with Differential/Platelet   Comprehensive Metabolic Panel (CMET)   Hepatic function panel   US Abdomen Complete     Return if symptoms worsen or fail to improve.      Vonna Kotyk, FNP, have reviewed all documentation for this visit. The documentation on 11/06/21 for the exam, diagnosis, procedures, and orders are all accurate and complete.    Gwyneth Sprout, [place] [phone] (phone) [phone] (fax)  [place]

## 2021-11-06 NOTE — Assessment & Plan Note (Signed)
Acute Lupper quad pain with NVD; unknown cause

## 2021-11-06 NOTE — Telephone Encounter (Signed)
Erin Richards with [facility] calling with Abdominal Ultrasound, report is in Epic.They will release pt. Called [facility] line, no answer. Will forward to practice.

## 2021-11-06 NOTE — Patient Instructions (Signed)
Please seek emergent care if you are no longer able to keep down liquids or if you have any other new/concerning symptoms or develop weakness. Work note provided covers you today, tomorrow with return to schools on Monday, 11/10/21.  Take care and we hope you feel better.  Gwyneth Sprout, [address] [place], [place] [address] [phone] (phone) [phone] (fax) [place]

## 2021-11-07 LAB — COMPREHENSIVE METABOLIC PANEL
ALT: 8 IU/L (ref 0–32)
AST: 17 IU/L (ref 0–40)
Albumin/Globulin Ratio: 2 (ref 1.2–2.2)
Albumin: 4.2 g/dL (ref 3.8–4.9)
Alkaline Phosphatase: 76 IU/L (ref 44–121)
BUN/Creatinine Ratio: 8 — ABNORMAL LOW (ref 9–23)
BUN: 6 mg/dL (ref 6–24)
Bilirubin Total: 0.8 mg/dL (ref 0.0–1.2)
CO2: 25 mmol/L (ref 20–29)
Calcium: 9.3 mg/dL (ref 8.7–10.2)
Chloride: 104 mmol/L (ref 96–106)
Creatinine, Ser: 0.71 mg/dL (ref 0.57–1.00)
Globulin, Total: 2.1 g/dL (ref 1.5–4.5)
Glucose: 77 mg/dL (ref 70–99)
Potassium: 4.2 mmol/L (ref 3.5–5.2)
Sodium: 141 mmol/L (ref 134–144)
Total Protein: 6.3 g/dL (ref 6.0–8.5)
eGFR: 101 mL/min/{1.73_m2} (ref >59)

## 2021-11-07 LAB — CBC WITH DIFFERENTIAL/PLATELET
Basophils Absolute: 0 10*3/uL (ref 0.0–0.2)
Basos: 1 %
EOS (ABSOLUTE): 0.1 10*3/uL (ref 0.0–0.4)
Eos: 1 %
Hematocrit: 37.5 % (ref 34.0–46.6)
Hemoglobin: 11.9 g/dL (ref 11.1–15.9)
Immature Grans (Abs): 0 10*3/uL (ref 0.0–0.1)
Immature Granulocytes: 0 %
Lymphocytes Absolute: 1.1 10*3/uL (ref 0.7–3.1)
Lymphs: 28 %
MCH: 26 pg — ABNORMAL LOW (ref 26.6–33.0)
MCHC: 31.7 g/dL (ref 31.5–35.7)
MCV: 82 fL (ref 79–97)
Monocytes Absolute: 0.4 10*3/uL (ref 0.1–0.9)
Monocytes: 9 %
Neutrophils Absolute: 2.3 10*3/uL (ref 1.4–7.0)
Neutrophils: 61 %
Platelets: 168 10*3/uL (ref 150–450)
RBC: 4.58 x10E6/uL (ref 3.77–5.28)
RDW: 13.8 % (ref 11.7–15.4)
WBC: 3.8 10*3/uL (ref 3.4–10.8)

## 2021-11-07 LAB — HEPATIC FUNCTION PANEL: Bilirubin, Direct: 0.24 mg/dL (ref 0.00–0.40)

## 2021-11-07 NOTE — Progress Notes (Signed)
All labs are normal and stable. Continue to use medication to assist with symptoms and eat a bland diet.  Take care, Gwyneth Sprout, [address] [place], [place] [address] [phone] (phone) [phone] (fax) [place]

## 2021-11-20 ENCOUNTER — Ambulatory Visit
Admission: RE | Admit: 2021-11-20 | Discharge: 2021-11-20 | Disposition: A | Discharge status: Home / Self Care | Payer: Medicaid Other | Source: Ambulatory Visit | Attending: Family Medicine | Admitting: Family Medicine

## 2021-11-20 DIAGNOSIS — Z1231 Encounter for screening mammogram for malignant neoplasm of breast: Secondary | ICD-10-CM | POA: Insufficient documentation

## 2021-11-21 ENCOUNTER — Encounter: Payer: Self-pay | Admitting: Family Medicine

## 2021-11-21 ENCOUNTER — Ambulatory Visit (INDEPENDENT_AMBULATORY_CARE_PROVIDER_SITE_OTHER): Payer: Medicaid Other | Admitting: Family Medicine

## 2021-11-21 VITALS — BP 109/56 | HR 80 | Temp 98.0°F | Resp 14 | Ht <= 58 in | Wt 139.0 lb

## 2021-11-21 DIAGNOSIS — N390 Urinary tract infection, site not specified: Secondary | ICD-10-CM

## 2021-11-21 DIAGNOSIS — R350 Frequency of micturition: Secondary | ICD-10-CM

## 2021-11-21 DIAGNOSIS — Z289 Immunization not carried out for unspecified reason: Secondary | ICD-10-CM | POA: Diagnosis not present

## 2021-11-21 DIAGNOSIS — Z0000 Encounter for general adult medical examination without abnormal findings: Secondary | ICD-10-CM | POA: Diagnosis not present

## 2021-11-21 DIAGNOSIS — Z23 Encounter for immunization: Secondary | ICD-10-CM

## 2021-11-21 LAB — POCT URINALYSIS DIPSTICK
Bilirubin, UA: NEGATIVE
Glucose, UA: NEGATIVE
Ketones, UA: NEGATIVE
Nitrite, UA: NEGATIVE
Protein, UA: NEGATIVE
Spec Grav, UA: <=1.005 — AB (ref 1.010–1.025)
Urobilinogen, UA: 0.2 E.U./dL
pH, UA: 7.5 (ref 5.0–8.0)

## 2021-11-21 MED ORDER — TETANUS-DIPHTHERIA TOXOIDS TD 5-2 LFU IM INJ: 0.5000 mL | INJECTION | Freq: Once | INTRAMUSCULAR | 0 refills | Status: AC
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
## 2021-11-24 ENCOUNTER — Telehealth: Payer: Medicaid Other | Admitting: Physician Assistant

## 2021-11-24 ENCOUNTER — Encounter: Payer: Self-pay | Admitting: Physician Assistant

## 2021-11-24 ENCOUNTER — Ambulatory Visit: Payer: Self-pay | Admitting: *Deleted

## 2021-11-24 DIAGNOSIS — T50Z95A Adverse effect of other vaccines and biological substances, initial encounter: Secondary | ICD-10-CM | POA: Diagnosis not present

## 2021-11-24 DIAGNOSIS — J111 Influenza due to unidentified influenza virus with other respiratory manifestations: Secondary | ICD-10-CM | POA: Diagnosis not present

## 2021-11-24 NOTE — Telephone Encounter (Signed)
Summary: vaccine concern / cold and flu like symptoms   The patient received the flu vaccine on 11/21/21   The patient shares that they are experiencing cold and flu like symptoms   The patient shares that they are experiencing body aches, congestion and a cough   The patient would like to discuss further with a member of staff when possible   Please contact further        Chief Complaint: flu like symptoms Symptoms: congestion, green mucous Frequency: started yesterday Pertinent Negatives: Patient denies fever Disposition: '[]'$ ED /'[]'$ Urgent Care (no appt availability in office) / '[]'$ Appointment(In office/virtual)/ '[x]'$  [place] [facility]/ '[]'$ Home Care/ '[]'$ Refused Recommended Disposition /'[]'$ [place] [facility]/ '[]'$  Follow-up with PCP Additional Notes: [place] [facility] visit, home care discussed.  Reason for Disposition  [1] Patient is NOT HIGH RISK AND [2] strongly requests antiviral medicine AND [3] flu symptoms present < 48 hours  Answer Assessment - Initial Assessment Questions 1. WORST SYMPTOM: "What is your worst symptom?" (e.g., cough, runny nose, muscle aches, headache, sore throat, fever)      Congestion blowing mucous out of nose, a little green 2. ONSET: "When did your flu symptoms start?"      Took flu vaccine Friday, started feeling bad today. 3. COUGH: "How bad is the cough?"       Some cough 4. RESPIRATORY DISTRESS: "Describe your breathing."      no 5. FEVER: "Do you have a fever?" If Yes, ask: "What is your temperature, how was it measured, and when did it start?"     no 6. EXPOSURE: "Were you exposed to someone with influenza?"       no 7. FLU VACCINE: "Did you get a flu shot this year?"     Friday, site is swollen and still painful 8. HIGH RISK DISEASE: "Do you have any chronic medical problems?" (e.g., heart or lung disease, asthma, weak immune system, or other HIGH RISK conditions)     OHS, born with murmor 9. PREGNANCY: "Is there any chance you are  pregnant?" "When was your last menstrual period?"     no 10. OTHER SYMPTOMS: "Do you have any other symptoms?"  (e.g., runny nose, muscle aches, headache, sore throat)       Headache, scratch throat, aches, running nose.  Protocols used: Influenza (Flu) - [facility]

## 2021-11-24 NOTE — Patient Instructions (Signed)
Erin Richards, thank you for joining Leeanne Rio, PA-C for today's virtual visit.  While this provider is not your primary care provider (PCP), if your PCP is located in our provider database this encounter information will be shared with them immediately following your visit.   [facility] account gives you access to today's visit and all your visits, tests, and labs performed at [facility] " click here if you don't have a [facility] account or go to [email or web address]  Consent: (Patient) Erin Richards provided verbal consent for this virtual visit at the beginning of the encounter.  Current Medications:  Current Outpatient Medications:    ACCU-CHEK GUIDE test strip, USE UP TO FOUR TIMES DAILY AS DIRECTED., Disp: 400 strip, Rfl: 0   Accu-Chek Softclix Lancets lancets, Use to check blood sugar daily, Disp: 100 each, Rfl: 4   ALPRAZolam (XANAX) 0.5 MG tablet, Take 0.5-1 tablets (0.25-0.5 mg total) by mouth 2 (two) times daily as needed (pain attacks)., Disp: 30 tablet, Rfl: 2   AMBULATORY NON FORMULARY MEDICATION, Automatic brachial blood pressure monitor.   Dx: Labile blood pressure, dizziness, Disp: 1 Units, Rfl: 0   Azelastine HCl 137 MCG/SPRAY SOLN, PLACE 1 SPRAY INTO BOTH NOSTRILS 2 (TWO) TIMES DAILY, Disp: 30 mL, Rfl: 5   Blood Glucose Monitoring Suppl (ACCU-CHEK GUIDE) w/Device KIT, Use daily to check blood sugars, Disp: 1 kit, Rfl: 0   cetirizine (ZYRTEC) 10 MG tablet, Take 1 tablet (10 mg total) by mouth daily. For allergies, Disp: 90 tablet, Rfl: 1   citalopram (CELEXA) 20 MG tablet, Take 2 tablets (40 mg total) by mouth daily. (Patient not taking: Reported on 11/21/2021), Disp: 180 tablet, Rfl: 1   clonazePAM (KLONOPIN) 0.5 MG tablet, Take 0.5 mg by mouth daily as needed., Disp: , Rfl:    clotrimazole-betamethasone (LOTRISONE) cream, Apply to affected area 2 times daily prn, Disp: 15 g, Rfl: 0   escitalopram (LEXAPRO) 20 MG tablet, Take  1 tablet (20 mg total) by mouth daily., Disp: 90 tablet, Rfl: 1   estradiol (ESTRACE) 0.5 MG tablet, Take 1 tablet (0.5 mg total) by mouth daily., Disp: 90 tablet, Rfl: 1   fluticasone (FLONASE) 50 MCG/ACT nasal spray, PLACE 2 SPRAYS INTO BOTH NOSTRILS DAILY AS NEEDED FOR ALLERGIES OR RHINITIS., Disp: 48 mL, Rfl: 1   hydrocortisone (ANUSOL-HC) 25 MG suppository, Place 1 suppository (25 mg total) rectally 2 (two) times daily., Disp: 12 suppository, Rfl: 0   hyoscyamine (LEVSIN) 0.125 MG tablet, TAKE ONE TABLET BY MOUTH EVERY 6 HOURS AS NEEDED FOR STOMACH CRAMPINGS, Disp: 20 tablet, Rfl: 1   ipratropium (ATROVENT) 0.03 % nasal spray, PLACE 2 SPRAYS INTO THE NOSE DAILY AS NEEDED., Disp: 90 mL, Rfl: 1   Ipratropium-Albuterol (COMBIVENT RESPIMAT) 20-100 MCG/ACT AERS respimat, INHALE 1 PUFF INTO THE LUNGS EVERY 4 HOURS AS NEEDED FOR WHEEZING., Disp: 4 each, Rfl: 1   linaclotide (LINZESS) 145 MCG CAPS capsule, Take 1 capsule (145 mcg total) by mouth daily before breakfast., Disp: 30 capsule, Rfl: 1   montelukast (SINGULAIR) 10 MG tablet, TAKE 1 TABLET BY MOUTH EVERYDAY AT BEDTIME, Disp: 90 tablet, Rfl: 3   Multiple Vitamin (MULTIVITAMIN WITH MINERALS) TABS tablet, Take 2 tablets by mouth daily., Disp: , Rfl:    naproxen (NAPROSYN) 500 MG tablet, TAKE 1 TABLET (500 MG TOTAL) BY MOUTH 2 (TWO) TIMES DAILY WITH A MEAL. AS NEEDED FOR PAIN, Disp: 30 tablet, Rfl: 3   nortriptyline (PAMELOR) 25 MG capsule, TAKE ONE  CAPSULE AT BEDTIME FOR MIGRANE PREVENTION AND IRRITABLE BOWEL SYNDROME, Disp: 90 capsule, Rfl: 1   ondansetron (ZOFRAN-ODT) 4 MG disintegrating tablet, Take 1 tablet (4 mg total) by mouth every 8 (eight) hours as needed for nausea or vomiting., Disp: 20 tablet, Rfl: 0   OXYGEN, Inhale 2 L into the lungs at bedtime as needed., Disp: , Rfl:    pantoprazole (PROTONIX) 40 MG tablet, Take 1 tablet (40 mg total) by mouth daily. For acid reflux, Disp: 90 tablet, Rfl: 3   promethazine (PHENERGAN) 12.5 MG tablet,  Take 1 tablet (12.5 mg total) by mouth every 8 (eight) hours as needed for nausea or vomiting., Disp: 20 tablet, Rfl: 2   Spacer/Aero-Holding Chambers (AEROCHAMBER PLUS) inhaler, Use with inhaler, Disp: 1 each, Rfl: 2   sucralfate (CARAFATE) 1 g tablet, Take 1 tablet (1 g total) by mouth 4 (four) times daily -  with meals and at bedtime., Disp: 120 tablet, Rfl: 0   Ubrogepant (UBRELVY) 50 MG TABS, Take 50 mg by mouth once as needed for up to 1 dose (migraines, headaches)., Disp: 30 tablet, Rfl: 1   VENTOLIN HFA 108 (90 Base) MCG/ACT inhaler, Inhale 2 puffs into the lungs every 6 (six) hours as needed for wheezing or shortness of breath., Disp: 18 g, Rfl: 5   zolpidem (AMBIEN) 10 MG tablet, TAKE 1 TABLET BY MOUTH EVERYDAY AT BEDTIME, Disp: 30 tablet, Rfl: 3   Medications ordered in this encounter:  No orders of the defined types were placed in this encounter.   *If you need refills on other medications prior to your next appointment, please contact your pharmacy*  Follow-Up: Call back or seek an in-person evaluation if the symptoms worsen or if the condition fails to improve as anticipated.  [facility] [phone]  Other Instructions Hydrate.Rest. Alternate Tylenol and Ibuprofen OTC for body aches, headache, soreness.  Continue allergy medication regimen.  Symptoms should ease up significantly over next 2 days. If not, or any new/worsening symptoms, let us or your PCP know ASAP.    If you have been instructed to have an in-person evaluation today at a local Urgent Care facility, please use the link below. It will take you to a list of all of our available [facility], including address, phone number and hours of operation. Please do not delay care.  [facility]  If you or a family member do not have a primary care provider, use the link below to schedule a visit and establish care. When you choose a [facility] primary care physician or advanced  practice provider, you gain a long-term partner in health. Find a Primary Care Provider  Learn more about [facility]'s in-office and virtual care options: [facility]
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
## 2021-11-25 ENCOUNTER — Encounter: Payer: Self-pay | Admitting: Physician Assistant

## 2021-11-25 MED ORDER — AMOXICILLIN-POT CLAVULANATE 875-125 MG PO TABS: 1.0000 | ORAL_TABLET | Freq: Two times a day (BID) | ORAL | 0 refills | Status: DC

## 2021-11-27 LAB — URINE CULTURE

## 2021-11-28 ENCOUNTER — Other Ambulatory Visit: Payer: Self-pay | Admitting: Family Medicine

## 2021-11-28 DIAGNOSIS — R1012 Left upper quadrant pain: Secondary | ICD-10-CM

## 2021-11-28 DIAGNOSIS — A084 Viral intestinal infection, unspecified: Secondary | ICD-10-CM

## 2021-12-01 ENCOUNTER — Encounter (INDEPENDENT_AMBULATORY_CARE_PROVIDER_SITE_OTHER): Payer: Self-pay

## 2021-12-01 ENCOUNTER — Other Ambulatory Visit (HOSPITAL_COMMUNITY)
Admission: RE | Admit: 2021-12-01 | Discharge: 2021-12-01 | Disposition: A | Discharge status: Home / Self Care | Payer: Medicaid Other | Source: Ambulatory Visit | Attending: Obstetrics | Admitting: Obstetrics

## 2021-12-01 ENCOUNTER — Other Ambulatory Visit: Payer: Self-pay

## 2021-12-01 ENCOUNTER — Ambulatory Visit: Payer: Medicaid Other | Admitting: Obstetrics

## 2021-12-01 VITALS — BP 112/70 | HR 73 | Ht <= 58 in | Wt 137.0 lb

## 2021-12-01 DIAGNOSIS — R87612 Low grade squamous intraepithelial lesion on cytologic smear of cervix (LGSIL): Secondary | ICD-10-CM

## 2021-12-01 DIAGNOSIS — N898 Other specified noninflammatory disorders of vagina: Secondary | ICD-10-CM | POA: Insufficient documentation

## 2021-12-01 DIAGNOSIS — N390 Urinary tract infection, site not specified: Secondary | ICD-10-CM
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
## 2021-12-01 NOTE — Telephone Encounter (Signed)
-----   Message from Birdie Sons, MD sent at 11/30/2021  9:15 AM EDT -----  ----- Message ----- From: Birdie Sons, MD Sent: 11/30/2021   9:14 AM EDT To: Caryn Section Nurse

## 2021-12-01 NOTE — Telephone Encounter (Signed)
Patient advised of message below. She states that she is still having symptoms and would like an antibiotic sent to her pharmacy. Prescription pending in this encounter. There is a allergy/ contraindication for this prescription. Please review.       Birdie Sons, MD  11/30/2021  9:01 AM EDT Back to Top    Urine culture shows very low grade infection with lactobacillus. If still has symptoms can send in prescription for cephalexin '500mg'$  three times daily for 7 days.

## 2021-12-02 ENCOUNTER — Other Ambulatory Visit: Payer: Self-pay | Admitting: Family Medicine

## 2021-12-02 MED ORDER — CEPHALEXIN 500 MG PO CAPS: 500.0000 mg | ORAL_CAPSULE | Freq: Three times a day (TID) | ORAL | 0 refills | Status: DC

## 2021-12-03 LAB — CERVICOVAGINAL ANCILLARY ONLY
Bacterial Vaginitis (gardnerella): NEGATIVE
Candida Glabrata: NEGATIVE
Candida Vaginitis: NEGATIVE
Chlamydia: NEGATIVE
Comment: NEGATIVE
Comment: NEGATIVE
Comment: NEGATIVE
Comment: NEGATIVE
Comment: NEGATIVE
Comment: NORMAL
Neisseria Gonorrhea: NEGATIVE
Trichomonas: NEGATIVE

## 2021-12-27 ENCOUNTER — Encounter: Payer: Self-pay | Admitting: Family Medicine

## 2021-12-27 ENCOUNTER — Other Ambulatory Visit: Payer: Self-pay | Admitting: Family Medicine

## 2021-12-27 DIAGNOSIS — G43809 Other migraine, not intractable, without status migrainosus: Secondary | ICD-10-CM

## 2022-01-01 ENCOUNTER — Telehealth: Payer: Medicaid Other | Admitting: Physician Assistant

## 2022-01-01 ENCOUNTER — Encounter: Payer: Self-pay | Admitting: Family Medicine

## 2022-01-01 DIAGNOSIS — B9789 Other viral agents as the cause of diseases classified elsewhere: Secondary | ICD-10-CM | POA: Diagnosis not present

## 2022-01-01 DIAGNOSIS — J019 Acute sinusitis, unspecified: Secondary | ICD-10-CM

## 2022-01-01 MED ORDER — PREDNISONE 20 MG PO TABS: 40.0000 mg | ORAL_TABLET | Freq: Every day | ORAL | 0 refills | Status: DC

## 2022-01-01 NOTE — Progress Notes (Signed)
I have spent 5 minutes in review of e-visit questionnaire, review and updating patient chart, medical decision making and response to patient.   Keandra Medero [place] Nordquist, PA-C    

## 2022-01-01 NOTE — Progress Notes (Signed)
E-Visit for Sinus Problems  We are sorry that you are not feeling well.  Here is how we plan to help!  Based on what you have shared with me it looks like you have sinusitis.  Sinusitis is inflammation and infection in the sinus cavities of the head.  Based on your presentation I believe you most likely have Acute Viral Sinusitis.This is an infection most likely caused by a virus. There is not specific treatment for viral sinusitis other than to help you with the symptoms until the infection runs its course.  You may use an oral decongestant such as Mucinex D or if you have glaucoma or high blood pressure use plain Mucinex. Saline nasal spray help and can safely be used as often as needed for congestion.  To reduce sinus inflammation/pain and break migraine cycle with this, I have prescribed a burst of prednisone to take as directed.   Some authorities believe that zinc sprays or the use of Echinacea may shorten the course of your symptoms.  Sinus infections are not as easily transmitted as other respiratory infection, however we still recommend that you avoid close contact with loved ones, especially the very young and elderly.  Remember to wash your hands thoroughly throughout the day as this is the number one way to prevent the spread of infection!  Home Care: Only take medications as instructed by your medical team. Do not take these medications with alcohol. A steam or ultrasonic humidifier can help congestion.  You can place a towel over your head and breathe in the steam from hot water coming from a faucet. Avoid close contacts especially the very young and the elderly. Cover your mouth when you cough or sneeze. Always remember to wash your hands.  Get Help Right Away If: You develop worsening fever or sinus pain. You develop a severe head ache or visual changes. Your symptoms persist after you have completed your treatment plan.  Make sure you Understand these instructions. Will  watch your condition. Will get help right away if you are not doing well or get worse.   Thank you for choosing an e-visit.  Your e-visit answers were reviewed by a board certified advanced clinical practitioner to complete your personal care plan. Depending upon the condition, your plan could have included both over the counter or prescription medications.  Please review your pharmacy choice. Make sure the pharmacy is open so you can pick up prescription now. If there is a problem, you may contact your provider through [employer] and have the prescription routed to another pharmacy.  Your safety is important to [place]. If you have drug allergies check your prescription carefully.   For the next 24 hours you can use MyChart to ask questions about today's visit, request a non-urgent call back, or ask for a work or school excuse. You will get an email in the next two days asking about your experience. I hope that your e-visit has been valuable and will speed your recovery.

## 2022-01-15 ENCOUNTER — Telehealth: Payer: Medicaid Other | Admitting: Family

## 2022-01-15 DIAGNOSIS — H00014 Hordeolum externum left upper eyelid: Secondary | ICD-10-CM | POA: Diagnosis not present

## 2022-01-15 MED ORDER — BACITRACIN-POLYMYXIN B 500-10000 UNIT/GM OP OINT: 1.0000 | TOPICAL_OINTMENT | Freq: Three times a day (TID) | OPHTHALMIC | 0 refills | Status: DC

## 2022-01-15 NOTE — Progress Notes (Signed)
  E-Visit for Stye   We are sorry that you are not feeling well. Here is how we plan to help!  Based on what you have shared with me it looks like you have a stye.  A stye is an inflammation of the eyelid.  It is often a red, painful lump near the edge of the eyelid that may look like a boil or a pimple.  A stye develops when an infection occurs at the base of an eyelash.   We have made appropriate suggestions for you based upon your presentation: Your symptoms may indicate an infection of the sclera.  The use of anti-inflammatory and antibiotic eye drops for a week will help resolve this condition.  I have sent in in   bacitracin-polymyxin ointment that you placed three times a day. If your symptoms do not improve over the next two to three days you should be seen in your doctor's office.  HOME CARE:  Wash your hands often! Let the stye open on its own. Don't squeeze or open it. Don't rub your eyes. This can irritate your eyes and let in bacteria.  If you need to touch your eyes, wash your hands first. Don't wear eye makeup or contact lenses until the area has healed.  GET HELP RIGHT AWAY IF:  Your symptoms do not improve. You develop blurred or loss of vision. Your symptoms worsen (increased discharge, pain or redness).   Thank you for choosing an e-visit.  Your e-visit answers were reviewed by a board certified advanced clinical practitioner to complete your personal care plan. Depending upon the condition, your plan could have included both over the counter or prescription medications.  Please review your pharmacy choice. Make sure the pharmacy is open so you can pick up prescription now. If there is a problem, you may contact your provider through [employer] and have the prescription routed to another pharmacy.  Your safety is important to [place]. If you have drug allergies check your prescription carefully.   For the next 24 hours you can use MyChart to ask questions about  today's visit, request a non-urgent call back, or ask for a work or school excuse. You will get an email in the next two days asking about your experience. I hope that your e-visit has been valuable and will speed your recovery.  Approximately 5 minutes was spent documenting and reviewing patient's chart.

## 2022-01-16 ENCOUNTER — Other Ambulatory Visit: Payer: Self-pay | Admitting: Family Medicine

## 2022-01-16 DIAGNOSIS — J301 Allergic rhinitis due to pollen: Secondary | ICD-10-CM

## 2022-01-19 ENCOUNTER — Other Ambulatory Visit (HOSPITAL_COMMUNITY)
Admission: RE | Admit: 2022-01-19 | Discharge: 2022-01-19 | Disposition: A | Discharge status: Home / Self Care | Payer: Medicaid Other | Source: Ambulatory Visit | Attending: Obstetrics & Gynecology | Admitting: Obstetrics & Gynecology

## 2022-01-19 ENCOUNTER — Ambulatory Visit: Payer: Medicaid Other | Admitting: Family Medicine

## 2022-01-19 ENCOUNTER — Ambulatory Visit (INDEPENDENT_AMBULATORY_CARE_PROVIDER_SITE_OTHER): Payer: Medicaid Other | Admitting: Obstetrics & Gynecology

## 2022-01-19 ENCOUNTER — Encounter: Payer: Self-pay | Admitting: Family Medicine

## 2022-01-19 ENCOUNTER — Encounter: Payer: Self-pay | Admitting: Obstetrics & Gynecology

## 2022-01-19 VITALS — BP 101/40 | HR 80 | Resp 16 | Wt 141.0 lb

## 2022-01-19 VITALS — BP 123/60 | HR 79 | Ht <= 58 in | Wt 140.0 lb

## 2022-01-19 DIAGNOSIS — B977 Papillomavirus as the cause of diseases classified elsewhere: Secondary | ICD-10-CM | POA: Diagnosis present

## 2022-01-19 DIAGNOSIS — R87612 Low grade squamous intraepithelial lesion on cytologic smear of cervix (LGSIL): Secondary | ICD-10-CM | POA: Insufficient documentation

## 2022-01-19 DIAGNOSIS — H1013 Acute atopic conjunctivitis, bilateral: Secondary | ICD-10-CM

## 2022-01-19 DIAGNOSIS — R87810 Cervical high risk human papillomavirus (HPV) DNA test positive: Secondary | ICD-10-CM | POA: Diagnosis not present

## 2022-01-19 MED ORDER — OLOPATADINE HCL 0.2 % OP SOLN: 1.0000 [drp] | Freq: Every day | OPHTHALMIC | 0 refills | Status: DC

## 2022-01-19 NOTE — Addendum Note (Signed)
Addended by: Quintella Baton D on: 01/19/2022 11:47 AM   Modules accepted: Orders

## 2022-01-19 NOTE — Progress Notes (Signed)
   Established Patient Office Visit  Subjective   Patient ID: Erin Richards, female    DOB: [birth year]  Age: 56 y.o. MRN: [id number]  No chief complaint on file.   HPI   56 yo single (but monogamous) P2 here for a colpo and evaluation of a vaginal discharge. She is new to this office. She had a supracervical hysterectomy in 2010 at [facility] (records not available). She had a pap smear 11/2021 that showed LGSIL and + HR HPV. She is very worried that she has cancer.  She has been checked for the same discharge several times and the Aptima testing is always normal. She reports an occasional vaginal itch and occasional smell.  She is sexually active with her boyfriend of 2 years.  Objective:     BP 123/60   Pulse 79   Ht '4\' 9"'$  (1.448 m)   Wt 140 lb (63.5 kg)   BMI 30.30 kg/m    Physical Exam   Well nourished, well hydrated White female, no apparent distress She is ambulating and conversing normally. Graves speculum placed A non--descript white vaginal discharge noted and sampled for Aptima testing Cervix prepped with acetic acid. Transformation zone seen in its entirety. Colpo adequate. Colpo normal ECC obtained. She tolerated the procedure well.  Assessment & Plan:   Problem List Items Addressed This Visit   None Visit Diagnoses     LGSIL on Pap smear of cervix    -  Primary   High risk HPV infection          Vaginal discharge- Aptima testing sent  I will send her a message next Monday with results and management plan.  No follow-ups on file.    Emily Filbert, MD

## 2022-01-19 NOTE — Progress Notes (Signed)
    SUBJECTIVE:   CHIEF COMPLAINT / HPI:   EYE COMPLAINT - had E-visit 12/14, dxed with stye and given antibiotic ointment, not helping - itchy - feels dry - a little pain, mostly itchy/dry. Duration: ~1 week Involved eye:   L>R Foreign body sensation:no Visual impairment: no Eye redness: yes Discharge: no Crusting or matting of eyelids: no Photophobia: a little Itching: yes Tearing: yes Floaters: no URI symptoms: yes, few days Contact lens use: no Close contacts with similar problems: no Eye trauma: no Aggravating factors: none Alleviating factors: none Status: stable Treatments attempted:antibiotic ointment, moisturizing drops    OBJECTIVE:   BP (!) 101/40 (BP Location: Left Arm, Patient Position: Sitting, Cuff Size: Normal)   Pulse 80   Resp 16   Wt 141 lb (64 kg)   SpO2 97%   BMI 30.51 kg/m   Gen: well appearing, in NAD HEENT: orophyarynx clear without exudate or erythema. Uvula midline. No tonsillar enlargement. Good dentition. TM visible b/l without bulging, erythema, purulence. No cervical or supraclavicular lymphadenopathy. PERRL, EOMI, no swelling or redness, conjunctiva noninjected, no discharge noted.  Card: RRR Lungs: CTAB Ext: WWP, no edema   ASSESSMENT/PLAN:   EYE ITCHING Exam normal today, no signs of infection/conjunctivitis/trauma. Also with symptoms of runny nose and h/o environmental allergies and with most worrisome symptom of itchiness, tearing, may be allergic etiology, recommend pataday drops. Continue oral zyrtec. No signs to warrant continuation of antibiotic treatment or urgent ophtho eval or imaging. F/u prn.    Myles Gip, DO

## 2022-01-20 ENCOUNTER — Encounter: Payer: Self-pay | Admitting: Obstetrics & Gynecology

## 2022-01-20 LAB — CERVICOVAGINAL ANCILLARY ONLY
Bacterial Vaginitis (gardnerella): NEGATIVE
Candida Glabrata: NEGATIVE
Candida Vaginitis: NEGATIVE
Comment: NEGATIVE
Comment: NEGATIVE
Comment: NEGATIVE
Comment: NEGATIVE
Trichomonas: NEGATIVE

## 2022-01-21 LAB — SURGICAL PATHOLOGY

## 2022-01-23 ENCOUNTER — Encounter: Payer: Self-pay | Admitting: Obstetrics & Gynecology

## 2022-01-29 ENCOUNTER — Telehealth (INDEPENDENT_AMBULATORY_CARE_PROVIDER_SITE_OTHER): Payer: Medicaid Other | Admitting: Family Medicine

## 2022-01-29 ENCOUNTER — Telehealth: Payer: Self-pay | Admitting: Family Medicine

## 2022-01-29 ENCOUNTER — Encounter: Payer: Self-pay | Admitting: Family Medicine

## 2022-01-29 ENCOUNTER — Ambulatory Visit: Payer: Self-pay

## 2022-01-29 DIAGNOSIS — H01136 Eczematous dermatitis of left eye, unspecified eyelid: Secondary | ICD-10-CM

## 2022-01-29 DIAGNOSIS — H01133 Eczematous dermatitis of right eye, unspecified eyelid: Secondary | ICD-10-CM | POA: Insufficient documentation

## 2022-01-29 MED ORDER — NYSTATIN-TRIAMCINOLONE 100000-0.1 UNIT/GM-% EX OINT: 1.0000 | TOPICAL_OINTMENT | Freq: Every evening | CUTANEOUS | 0 refills | Status: DC | PRN

## 2022-01-29 NOTE — Telephone Encounter (Signed)
  Chief Complaint: eyelid irritation  Symptoms: L eyelid redness, irritation, and dryness, burning and itching Frequency: 2-3 weeks  Pertinent Negatives: Patient denies swelling or drainage  Disposition: '[]'$ ED /'[]'$ Urgent Care (no appt availability in office) / '[x]'$ Appointment(In office/virtual)/ '[]'$  [facility]/ '[]'$ Home Care/ '[]'$ Refused Recommended Disposition /'[]'$ [facility]/ '[]'$  Follow-up with PCP Additional Notes: pt was seen on 01/19/22 for allergic conjunctivitis. Pt states she has done the gtts and got Systane OTC and nothing has improved sx. Pt still having itching and burning to L eyelid. Unable to come in today so scheduled VV at 1520 with Daneil Dan, NP.   Summary: Eye irritation and burning   The patient called in stating she saw the provider 10 days ago for her irritated left eyelid. She was told to go get eyedrops so she got Systane and Olopatadine drops but neither of them helped. She states her eyelid is still red, irritated, burning and itchy. Please assist patient further       Reason for Disposition  Eye allergy is a chronic symptom (recurrent or ongoing AND present > 4 weeks)  Answer Assessment - Initial Assessment Questions 1. SEVERITY: "How bad is the itching?"  (e.g., Scale 1-10; mild, moderate or severe)     Moderately 2. ONSET: "When did the eye symptoms start?" (e.g., hours or days ago)     2-3 weeks  3. EYELIDS: "Are the eyelids swollen?" If Yes, ask: "How much?"     L eyelid 4. EYE DISCHARGE: "Is there any discharge from the eye, or eyelid crusting?" If Yes, ask: "How much?"     no 6. RECURRENT PROBLEM: "Have you experienced eye allergies before?" If Yes, ask: "When was the last time?" and "What medicine worked best in the past?"     no 7. CONTACTS: "Do you wear contacts?"     no 8. OTHER SYMPTOMS: "Do you have any other symptoms?" (e.g., runny nose)     Itchy and dry and irritated  Protocols used: Eye - Allergy-A-AH

## 2022-01-29 NOTE — Progress Notes (Signed)
I,[name],acting as a Education administrator for Gwyneth Sprout, FNP.,have documented all relevant documentation on the behalf of Gwyneth Sprout, FNP,as directed by  Gwyneth Sprout, FNP while in the presence of Gwyneth Sprout, FNP.  MyChart Video Visit  Virtual Visit via Video Note   This format is felt to be most appropriate for this patient at this time. Physical exam was limited by quality of the video and audio technology used for the visit.   Patient location: Home Provider location: [facility]  I discussed the limitations of evaluation and management by telemedicine and the availability of in person appointments. The patient expressed understanding and agreed to proceed.  Patient: Erin Richards   DOB: Jun 19, 1965   56 y.o. Female  MRN: [id number] Visit Date: 01/29/2022  Today's healthcare provider: Gwyneth Sprout, FNP  Introduced to nurse practitioner role and practice setting.  All questions answered.  Discussed provider/patient relationship and expectations.  Subjective    HPI  Patient is being seen today because her left eyelid is still inflamed since last viist. Also reports her right eye lid is starting to do the same and red. Reports her left eyelid now has a burning sensation and tearing up.   Medications: Outpatient Medications Prior to Visit  Medication Sig   ACCU-CHEK GUIDE test strip USE UP TO FOUR TIMES DAILY AS DIRECTED.   Accu-Chek Softclix Lancets lancets Use to check blood sugar daily   ALPRAZolam (XANAX) 0.5 MG tablet Take 0.5-1 tablets (0.25-0.5 mg total) by mouth 2 (two) times daily as needed (pain attacks).   Azelastine HCl 137 MCG/SPRAY SOLN PLACE 1 SPRAY INTO BOTH NOSTRILS 2 (TWO) TIMES DAILY   bacitracin-polymyxin b (POLYSPORIN) ophthalmic ointment Place 1 Application into the left eye 3 (three) times daily.   Blood Glucose Monitoring Suppl (ACCU-CHEK GUIDE) w/Device KIT Use daily to check blood sugars   cephALEXin (KEFLEX) 500 MG capsule Take 1  capsule (500 mg total) by mouth 3 (three) times daily.   cetirizine (ZYRTEC) 10 MG tablet Take 1 tablet (10 mg total) by mouth daily. For allergies   clonazePAM (KLONOPIN) 0.5 MG tablet Take 0.5 mg by mouth daily as needed.   clotrimazole-betamethasone (LOTRISONE) cream Apply to affected area 2 times daily prn   escitalopram (LEXAPRO) 20 MG tablet TAKE 1 TABLET BY MOUTH EVERY DAY   estradiol (ESTRACE) 0.5 MG tablet Take 1 tablet (0.5 mg total) by mouth daily.   fluticasone (FLONASE) 50 MCG/ACT nasal spray PLACE 2 SPRAYS INTO BOTH NOSTRILS DAILY AS NEEDED FOR ALLERGIES OR RHINITIS.   hydrocortisone (ANUSOL-HC) 25 MG suppository Place 1 suppository (25 mg total) rectally 2 (two) times daily.   hyoscyamine (LEVSIN) 0.125 MG tablet TAKE ONE TABLET BY MOUTH EVERY 6 HOURS AS NEEDED FOR STOMACH CRAMPINGS   ipratropium (ATROVENT) 0.03 % nasal spray PLACE 2 SPRAYS INTO THE NOSE DAILY AS NEEDED.   Ipratropium-Albuterol (COMBIVENT RESPIMAT) 20-100 MCG/ACT AERS respimat INHALE 1 PUFF INTO THE LUNGS EVERY 4 HOURS AS NEEDED FOR WHEEZING.   linaclotide (LINZESS) 145 MCG CAPS capsule Take 1 capsule (145 mcg total) by mouth daily before breakfast.   montelukast (SINGULAIR) 10 MG tablet TAKE 1 TABLET BY MOUTH EVERYDAY AT BEDTIME   Multiple Vitamin (MULTIVITAMIN WITH MINERALS) TABS tablet Take 2 tablets by mouth daily.   naproxen (NAPROSYN) 500 MG tablet TAKE 1 TABLET (500 MG TOTAL) BY MOUTH 2 (TWO) TIMES DAILY WITH A MEAL. AS NEEDED FOR PAIN   nortriptyline (PAMELOR) 25 MG capsule TAKE  ONE CAPSULE AT BEDTIME FOR MIGRANE PREVENTION AND IRRITABLE BOWEL SYNDROME   Olopatadine HCl 0.2 % SOLN Apply 1 drop to eye daily.   ondansetron (ZOFRAN-ODT) 4 MG disintegrating tablet Take 1 tablet (4 mg total) by mouth every 8 (eight) hours as needed for nausea or vomiting.   OXYGEN Inhale 2 L into the lungs at bedtime as needed.   pantoprazole (PROTONIX) 40 MG tablet Take 1 tablet (40 mg total) by mouth daily. For acid reflux    promethazine (PHENERGAN) 12.5 MG tablet Take 1 tablet (12.5 mg total) by mouth every 8 (eight) hours as needed for nausea or vomiting.   Spacer/Aero-Holding Chambers (AEROCHAMBER PLUS) inhaler Use with inhaler   sucralfate (CARAFATE) 1 g tablet Take 1 tablet (1 g total) by mouth 4 (four) times daily -  with meals and at bedtime.   Ubrogepant (UBRELVY) 50 MG TABS Take 50 mg by mouth once as needed for up to 1 dose (migraines, headaches).   VENTOLIN HFA 108 (90 Base) MCG/ACT inhaler Inhale 2 puffs into the lungs every 6 (six) hours as needed for wheezing or shortness of breath.   zolpidem (AMBIEN) 10 MG tablet TAKE 1 TABLET BY MOUTH EVERYDAY AT BEDTIME   No facility-administered medications prior to visit.   Review of Systems   Objective    There were no vitals taken for this visit.  Physical Exam Vitals and nursing note reviewed.  Constitutional:      General: She is not in acute distress.    Appearance: Normal appearance. She is obese. She is not ill-appearing, toxic-appearing or diaphoretic.  HENT:     Head: Normocephalic and atraumatic.     Nose: No congestion or rhinorrhea.  Eyes:     General: Vision grossly intact.     Conjunctiva/sclera:     Right eye: Right conjunctiva is not injected. No exudate.    Left eye: Left conjunctiva is not injected. No exudate.  Cardiovascular:     Rate and Rhythm: Normal rate and regular rhythm.     Pulses: Normal pulses.     Heart sounds: Normal heart sounds. No murmur heard.    No friction rub. No gallop.  Pulmonary:     Effort: Pulmonary effort is normal. No respiratory distress.     Breath sounds: Normal breath sounds. No stridor. No wheezing, rhonchi or rales.  Chest:     Chest wall: No tenderness.  Abdominal:     General: Bowel sounds are normal.     Palpations: Abdomen is soft.  Musculoskeletal:        General: No swelling, tenderness, deformity or signs of injury. Normal range of motion.     Right lower leg: No edema.     Left  lower leg: No edema.  Skin:    General: Skin is warm and dry.     Capillary Refill: Capillary refill takes less than 2 seconds.     Coloration: Skin is not jaundiced or pale.     Findings: No bruising, erythema, lesion or rash.  Neurological:     General: No focal deficit present.     Mental Status: She is alert and oriented to person, place, and time. Mental status is at baseline.     Cranial Nerves: No cranial nerve deficit.     Sensory: No sensory deficit.     Motor: No weakness.     Coordination: Coordination normal.  Psychiatric:        Mood and Affect: Mood normal.  Behavior: Behavior normal.        Thought Content: Thought content normal.        Judgment: Judgment normal.       Assessment & Plan     Problem List Items Addressed This Visit       Musculoskeletal and Integument   Eyelid dermatitis, eczematous, right - Primary    Acute, symptoms for 2+ weeks No known viral concerns No sick contacts No travel Did not improve with topical creams for ABX concern or anti-histamine drops Trial of steroid given lid concern; not inner eye RTC precautions reviewed      Relevant Medications   nystatin-triamcinolone ointment (MYCOLOG)   Return if symptoms worsen or fail to improve.    I discussed the assessment and treatment plan with the patient. The patient was provided an opportunity to ask questions and all were answered. The patient agreed with the plan and demonstrated an understanding of the instructions.   The patient was advised to call back or seek an in-person evaluation if the symptoms worsen or if the condition fails to improve as anticipated.  I provided 10 minutes of face-to-face time during this encounter reviewing previous virtual and in office notes for similar complaints.  Vonna Kotyk, FNP, have reviewed all documentation for this visit. The documentation on 01/29/22 for the exam, diagnosis, procedures, and orders are all accurate and  complete.  Gwyneth Sprout, [place] [phone] (phone) [phone] (fax)  [place]

## 2022-01-29 NOTE — Telephone Encounter (Signed)
Pt stated she was informed by her pharmacy that the Rx that was sent in is not covered by her insurance and it will cost her $150. Pt stated she was told that the Rx would not be covered by insurance due to it being a combination. Pt requests that the prescriptions be sent in separately not in conjunction.

## 2022-01-29 NOTE — Assessment & Plan Note (Signed)
Acute, symptoms for 2+ weeks No known viral concerns No sick contacts No travel Did not improve with topical creams for ABX concern or anti-histamine drops Trial of steroid given lid concern; not inner eye RTC precautions reviewed

## 2022-01-30 ENCOUNTER — Other Ambulatory Visit: Payer: Self-pay | Admitting: Family Medicine

## 2022-01-30 ENCOUNTER — Other Ambulatory Visit: Payer: Self-pay

## 2022-01-30 DIAGNOSIS — H01136 Eczematous dermatitis of left eye, unspecified eyelid: Secondary | ICD-10-CM

## 2022-01-30 MED ORDER — TRIAMCINOLONE ACETONIDE 0.5 % EX OINT: 1.0000 | TOPICAL_OINTMENT | Freq: Two times a day (BID) | CUTANEOUS | 0 refills | Status: DC

## 2022-01-30 NOTE — Telephone Encounter (Signed)
Patient called to get an update on the new ointment prescription. Please follow up with patient.

## 2022-02-19 ENCOUNTER — Other Ambulatory Visit: Payer: Self-pay | Admitting: Family Medicine

## 2022-02-19 DIAGNOSIS — J301 Allergic rhinitis due to pollen: Secondary | ICD-10-CM

## 2022-03-03 ENCOUNTER — Ambulatory Visit (INDEPENDENT_AMBULATORY_CARE_PROVIDER_SITE_OTHER): Payer: Medicaid Other | Admitting: Physician Assistant

## 2022-03-03 ENCOUNTER — Encounter: Payer: Self-pay | Admitting: Physician Assistant

## 2022-03-03 VITALS — BP 103/52 | HR 90 | Temp 97.7°F | Ht <= 58 in | Wt 140.0 lb

## 2022-03-03 DIAGNOSIS — J349 Unspecified disorder of nose and nasal sinuses: Secondary | ICD-10-CM

## 2022-03-03 DIAGNOSIS — J4540 Moderate persistent asthma, uncomplicated: Secondary | ICD-10-CM | POA: Diagnosis not present

## 2022-03-03 DIAGNOSIS — R059 Cough, unspecified: Secondary | ICD-10-CM

## 2022-03-03 MED ORDER — COMBIVENT RESPIMAT 20-100 MCG/ACT IN AERS: INHALATION_SPRAY | RESPIRATORY_TRACT | 1 refills | Status: DC

## 2022-03-03 NOTE — Progress Notes (Unsigned)
Established patient visit   Patient: Erin Richards   DOB: Oct 24, 1965   57 y.o. Female  MRN: [id number] Visit Date: 03/03/2022  Today's healthcare provider: Mardene Speak, PA-C  CC: worsening of cough and congestion  Subjective     HPI   Pt stated- coughing w/ green mucus, fore head/face is sore, chills--1 week. Tried sudafed and night quil. Last edited by Elta Guadeloupe, CMA on 03/03/2022  3:58 PM.      Getting worse: chest congestion and worsening, changing her voice, " nose is running like an opening of facet."  Medications: Outpatient Medications Prior to Visit  Medication Sig   ACCU-CHEK GUIDE test strip USE UP TO FOUR TIMES DAILY AS DIRECTED.   Accu-Chek Softclix Lancets lancets Use to check blood sugar daily   ALPRAZolam (XANAX) 0.5 MG tablet Take 0.5-1 tablets (0.25-0.5 mg total) by mouth 2 (two) times daily as needed (pain attacks).   Azelastine HCl 137 MCG/SPRAY SOLN PLACE 1 SPRAY INTO BOTH NOSTRILS 2 (TWO) TIMES DAILY   bacitracin-polymyxin b (POLYSPORIN) ophthalmic ointment Place 1 Application into the left eye 3 (three) times daily.   Blood Glucose Monitoring Suppl (ACCU-CHEK GUIDE) w/Device KIT Use daily to check blood sugars   cetirizine (ZYRTEC) 10 MG tablet Take 1 tablet (10 mg total) by mouth daily. For allergies   clonazePAM (KLONOPIN) 0.5 MG tablet Take 0.5 mg by mouth daily as needed.   clotrimazole-betamethasone (LOTRISONE) cream Apply to affected area 2 times daily prn   escitalopram (LEXAPRO) 20 MG tablet TAKE 1 TABLET BY MOUTH EVERY DAY   estradiol (ESTRACE) 0.5 MG tablet Take 1 tablet (0.5 mg total) by mouth daily.   fluticasone (FLONASE) 50 MCG/ACT nasal spray PLACE 2 SPRAYS INTO BOTH NOSTRILS DAILY AS NEEDED FOR ALLERGIES OR RHINITIS.   hydrocortisone (ANUSOL-HC) 25 MG suppository Place 1 suppository (25 mg total) rectally 2 (two) times daily.   hyoscyamine (LEVSIN) 0.125 MG tablet TAKE ONE TABLET BY MOUTH EVERY 6 HOURS AS NEEDED FOR STOMACH  CRAMPINGS   ipratropium (ATROVENT) 0.03 % nasal spray PLACE 2 SPRAYS INTO THE NOSE DAILY AS NEEDED.   linaclotide (LINZESS) 145 MCG CAPS capsule Take 1 capsule (145 mcg total) by mouth daily before breakfast.   montelukast (SINGULAIR) 10 MG tablet TAKE 1 TABLET BY MOUTH EVERYDAY AT BEDTIME   Multiple Vitamin (MULTIVITAMIN WITH MINERALS) TABS tablet Take 2 tablets by mouth daily.   naproxen (NAPROSYN) 500 MG tablet TAKE 1 TABLET (500 MG TOTAL) BY MOUTH 2 (TWO) TIMES DAILY WITH A MEAL. AS NEEDED FOR PAIN   nortriptyline (PAMELOR) 25 MG capsule TAKE ONE CAPSULE AT BEDTIME FOR MIGRANE PREVENTION AND IRRITABLE BOWEL SYNDROME   Olopatadine HCl 0.2 % SOLN Apply 1 drop to eye daily.   ondansetron (ZOFRAN-ODT) 4 MG disintegrating tablet Take 1 tablet (4 mg total) by mouth every 8 (eight) hours as needed for nausea or vomiting.   OXYGEN Inhale 2 L into the lungs at bedtime as needed.   pantoprazole (PROTONIX) 40 MG tablet Take 1 tablet (40 mg total) by mouth daily. For acid reflux   promethazine (PHENERGAN) 12.5 MG tablet Take 1 tablet (12.5 mg total) by mouth every 8 (eight) hours as needed for nausea or vomiting.   Spacer/Aero-Holding Chambers (AEROCHAMBER PLUS) inhaler Use with inhaler   sucralfate (CARAFATE) 1 g tablet Take 1 tablet (1 g total) by mouth 4 (four) times daily -  with meals and at bedtime.   triamcinolone ointment (KENALOG) 0.5 % Apply  1 Application topically 2 (two) times daily.   Ubrogepant (UBRELVY) 50 MG TABS Take 50 mg by mouth once as needed for up to 1 dose (migraines, headaches).   zolpidem (AMBIEN) 10 MG tablet TAKE 1 TABLET BY MOUTH EVERYDAY AT BEDTIME   [DISCONTINUED] cephALEXin (KEFLEX) 500 MG capsule Take 1 capsule (500 mg total) by mouth 3 (three) times daily.   [DISCONTINUED] Ipratropium-Albuterol (COMBIVENT RESPIMAT) 20-100 MCG/ACT AERS respimat INHALE 1 PUFF INTO THE LUNGS EVERY 4 HOURS AS NEEDED FOR WHEEZING.   [DISCONTINUED] VENTOLIN HFA 108 (90 Base) MCG/ACT inhaler  Inhale 2 puffs into the lungs every 6 (six) hours as needed for wheezing or shortness of breath.   No facility-administered medications prior to visit.    Review of Systems  All other systems reviewed and are negative. Except see hpi     Objective    BP (!) 103/52 (BP Location: Right Arm, Patient Position: Sitting, Cuff Size: Normal)   Pulse 90   Temp 97.7 F (36.5 C)   Ht '4\' 9"'$  (1.448 m)   Wt 140 lb (63.5 kg)   SpO2 98%   BMI 30.30 kg/m    Physical Exam Vitals reviewed.  Constitutional:      General: She is not in acute distress.    Appearance: Normal appearance. She is well-developed. She is not diaphoretic.  HENT:     Head: Normocephalic and atraumatic.     Nose: Congestion and rhinorrhea present.  Eyes:     General: No scleral icterus.    Conjunctiva/sclera: Conjunctivae normal.  Neck:     Thyroid: No thyromegaly.  Cardiovascular:     Rate and Rhythm: Normal rate and regular rhythm.     Pulses: Normal pulses.     Heart sounds: Normal heart sounds. No murmur heard. Pulmonary:     Effort: Pulmonary effort is normal. No respiratory distress.     Breath sounds: Normal breath sounds. No wheezing, rhonchi or rales.  Musculoskeletal:     Cervical back: Neck supple.     Right lower leg: No edema.     Left lower leg: No edema.  Lymphadenopathy:     Cervical: No cervical adenopathy.  Skin:    General: Skin is warm and dry.     Findings: No rash.  Neurological:     Mental Status: She is alert and oriented to person, place, and time. Mental status is at baseline.  Psychiatric:        Behavior: Behavior normal.        Thought Content: Thought content normal.        Judgment: Judgment normal.      No results found for any visits on 03/03/22.  Assessment & Plan      Moderate persistent asthma, unspecified whether complicated Chronic Normal PE, normal vitals including ox sat Continue: - Ipratropium-Albuterol (COMBIVENT RESPIMAT) 20-100 MCG/ACT AERS respimat;  INHALE 1 PUFF INTO THE LUNGS EVERY 4 HOURS AS NEEDED FOR WHEEZING.  Dispense: 4 each; Refill: 1 Will FU PRN  Cough, unspecified type Respiratory problem X 7 days Could be due to Rhinosinusitis -  symptoms and exam c/w sinusitis   - given duration of symptoms, suspect viral etiology - Advised to continue symptomatic treatment( nasal saline rinse, hydration, air humidifier, OTC flonase and ect) - - Ipratropium-Albuterol (COMBIVENT RESPIMAT) 20-100 MCG/ACT AERS respimat; INHALE 1 PUFF INTO THE LUNGS EVERY 4 HOURS AS NEEDED FOR WHEEZING.  Dispense: 4 each; Refill: 1 - will hold antibiotics. - discussed the potential negative side  effects of antibiotics and that they can lead to resistance if used unnecessarily; discussed expectations for duration of symptoms. Discussed that they may feel bad for up to a week, but sometimes symptoms last longer. - explained  treatment options that will help with symptoms : symptomatic management (flonase, decongestants, etc) - discussed concerning symptoms like high fever, SOB, facial pain. The patient was advised to call back or seek an in-person evaluation if the symptoms worsen or if the condition fails to improve as anticipated.    I discussed the assessment and treatment plan with the patient. The patient was provided an opportunity to ask questions and all were answered. The patient agreed with the plan and demonstrated an understanding of the instructions.  The entirety of the information documented in the History of Present Illness, Review of Systems and Physical Exam were personally obtained by me. Portions of this information were initially documented by the CMA and reviewed by me for thoroughness and accuracy.  Mardene Speak, [facility], [place] [phone] (phone) [phone] (fax)

## 2022-03-04 ENCOUNTER — Encounter: Payer: Self-pay | Admitting: Physician Assistant

## 2022-03-04 DIAGNOSIS — J329 Chronic sinusitis, unspecified: Secondary | ICD-10-CM

## 2022-03-06 MED ORDER — AMOXICILLIN-POT CLAVULANATE 875-125 MG PO TABS: 1.0000 | ORAL_TABLET | Freq: Two times a day (BID) | ORAL | 0 refills | Status: DC

## 2022-03-15 ENCOUNTER — Other Ambulatory Visit: Payer: Self-pay | Admitting: Family Medicine

## 2022-03-15 DIAGNOSIS — Z79890 Hormone replacement therapy (postmenopausal): Secondary | ICD-10-CM

## 2022-03-15 DIAGNOSIS — N951 Menopausal and female climacteric states: Secondary | ICD-10-CM

## 2022-03-16 NOTE — Telephone Encounter (Signed)
Requested Prescriptions  Pending Prescriptions Disp Refills   estradiol (ESTRACE) 0.5 MG tablet [Pharmacy Med Name: ESTRADIOL 0.5 MG TABLET] 90 tablet 0    Sig: TAKE 1 TABLET BY MOUTH EVERY DAY     OB/GYN:  Estrogens Passed - 03/15/2022 10:16 AM      Passed - Mammogram is up-to-date per Health Maintenance      Passed - Last BP in normal range    BP Readings from Last 1 Encounters:  03/03/22 (!) 103/52         Passed - Valid encounter within last 12 months    Recent Outpatient Visits           1 week ago Cough, unspecified type   [facility] [place], [place], PA-C   1 month ago Eyelid dermatitis, eczematous, left   [place] Tally Joe T, FNP   1 month ago Allergic conjunctivitis of both eyes   [place], DO   3 months ago Annual physical exam   [facility] Birdie Sons, MD   4 months ago Viral gastroenteritis   [place] Gwyneth Sprout, [place]

## 2022-03-18 ENCOUNTER — Ambulatory Visit (INDEPENDENT_AMBULATORY_CARE_PROVIDER_SITE_OTHER): Payer: Medicaid Other

## 2022-03-18 ENCOUNTER — Ambulatory Visit
Admission: EM | Admit: 2022-03-18 | Discharge: 2022-03-18 | Disposition: A | Discharge status: Home / Self Care | Payer: Medicaid Other | Attending: Family Medicine | Admitting: Family Medicine

## 2022-03-18 ENCOUNTER — Other Ambulatory Visit: Payer: Self-pay | Admitting: Family Medicine

## 2022-03-18 DIAGNOSIS — J301 Allergic rhinitis due to pollen: Secondary | ICD-10-CM

## 2022-03-18 DIAGNOSIS — U071 COVID-19: Secondary | ICD-10-CM | POA: Diagnosis not present

## 2022-03-18 DIAGNOSIS — R059 Cough, unspecified: Secondary | ICD-10-CM

## 2022-03-18 LAB — RESP PANEL BY RT-PCR (RSV, FLU A&B, COVID)  RVPGX2
Influenza A by PCR: NEGATIVE
Influenza B by PCR: NEGATIVE
Resp Syncytial Virus by PCR: NEGATIVE
SARS Coronavirus 2 by RT PCR: POSITIVE — AB

## 2022-03-18 LAB — GROUP A STREP BY PCR: Group A Strep by PCR: NOT DETECTED

## 2022-03-18 MED ORDER — PREDNISONE 50 MG PO TABS: 50.0000 mg | ORAL_TABLET | Freq: Every day | ORAL | 0 refills | Status: DC

## 2022-03-18 MED ORDER — PROMETHAZINE-DM 6.25-15 MG/5ML PO SYRP: 5.0000 mL | ORAL_SOLUTION | Freq: Four times a day (QID) | ORAL | 0 refills | Status: DC | PRN

## 2022-03-18 NOTE — ED Triage Notes (Signed)
Pt c/o cough,congestion & sore throat x3 days. Denies any fever,chills or bodyaches.

## 2022-03-18 NOTE — Discharge Instructions (Addendum)
We will contact you if your COVID/influenza test is positive.  Please quarantine while you wait for the results.  If your test is negative you may resume normal activities.  If your test is positive please continue to quarantine for at least 5 days from your symptom onset or until you are without a fever for at least 24 hours after the medications.  Stop by the pharmacy to pick them up.  You can take Tylenol and/or Ibuprofen as needed for fever reduction and pain relief.    For cough: honey 1/2 to 1 teaspoon (you can dilute the honey in water or another fluid).  You can also use guaifenesin and dextromethorphan for cough. You can use a humidifier for chest congestion and cough.  If you don't have a humidifier, you can sit in the bathroom with the hot shower running.      For sore throat: try warm salt water gargles, Mucinex sore throat cough drops or cepacol lozenges, throat spray, warm tea or water with lemon/honey, popsicles or ice, or OTC cold relief medicine for throat discomfort. You can also purchase chloraseptic spray at the pharmacy or dollar store.   For congestion: take a daily anti-histamine like Zyrtec, Claritin, and a oral decongestant, such as pseudoephedrine.  You can also use Flonase 1-2 sprays in each nostril daily. Afrin is also a good option, if you do not have high blood pressure.    It is important to stay hydrated: drink plenty of fluids (water, gatorade/powerade/pedialyte, juices, or teas) to keep your throat moisturized and help further relieve irritation/discomfort.    Return or go to the Emergency Department if symptoms worsen or do not improve in the next few days
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
## 2022-03-21 ENCOUNTER — Other Ambulatory Visit: Payer: Self-pay | Admitting: Family Medicine

## 2022-03-21 ENCOUNTER — Telehealth: Payer: Self-pay | Admitting: Emergency Medicine

## 2022-03-21 DIAGNOSIS — J301 Allergic rhinitis due to pollen: Secondary | ICD-10-CM

## 2022-03-21 MED ORDER — PROMETHAZINE-DM 6.25-15 MG/5ML PO SYRP: 5.0000 mL | ORAL_SOLUTION | Freq: Four times a day (QID) | ORAL | 0 refills | Status: DC | PRN

## 2022-03-21 NOTE — Telephone Encounter (Signed)
Requesting refill of cough medications, obliged.

## 2022-03-23 ENCOUNTER — Ambulatory Visit: Payer: Self-pay

## 2022-03-23 ENCOUNTER — Encounter: Payer: Self-pay | Admitting: Family Medicine

## 2022-03-23 ENCOUNTER — Ambulatory Visit (INDEPENDENT_AMBULATORY_CARE_PROVIDER_SITE_OTHER): Payer: Medicaid Other | Admitting: Family Medicine

## 2022-03-23 DIAGNOSIS — J329 Chronic sinusitis, unspecified: Secondary | ICD-10-CM | POA: Diagnosis not present

## 2022-03-23 MED ORDER — AMOXICILLIN-POT CLAVULANATE 875-125 MG PO TABS: 1.0000 | ORAL_TABLET | Freq: Two times a day (BID) | ORAL | 0 refills | Status: AC

## 2022-03-23 NOTE — Progress Notes (Signed)
I,Joseline E Rosas,acting as a scribe for [employer], MD.,have documented all relevant documentation on the behalf of Eulis Foster, MD,as directed by  Eulis Foster, MD while in the presence of Eulis Foster, MD.   Established patient visit   Patient: Erin Richards   DOB: [birth year]   57 y.o. Female  MRN: [id number] Visit Date: 03/23/2022  Today's healthcare provider: Eulis Foster, MD   Chief Complaint  Patient presents with   Follow-up    From urgent care. Patient was seen 02/14/224 and was positive for Covid   Subjective    HPI HPI     Follow-up    Additional comments: From urgent care. Patient was seen 02/14/224 and was positive for Covid      Last edited by Doristine Devoid, CMA on 03/23/2022 10:54 AM.       UC  follow up for COVID  Patient was diagnosed with COVID on 03/18/22  She was prescribed prednisone and promethazine DM  Today she reports continued productive cough that is worsening. Reports that gets tired very easily. Denies wheezing or SOB, No fever. Patient finished Prednisone this morning. Still taking promethazine DM. She states concern for these symptoms due to her ASD and heart problems  She reports having a lot of dark green nasal discharge and coughing up dark green sputum sometimes    Medications: Outpatient Medications Prior to Visit  Medication Sig   ACCU-CHEK GUIDE test strip USE UP TO FOUR TIMES DAILY AS DIRECTED.   Accu-Chek Softclix Lancets lancets Use to check blood sugar daily   ALPRAZolam (XANAX) 0.5 MG tablet Take 0.5-1 tablets (0.25-0.5 mg total) by mouth 2 (two) times daily as needed (pain attacks).   Azelastine HCl 137 MCG/SPRAY SOLN PLACE 1 SPRAY INTO BOTH NOSTRILS 2 (TWO) TIMES DAILY   bacitracin-polymyxin b (POLYSPORIN) ophthalmic ointment Place 1 Application into the left eye 3 (three) times daily.   Blood Glucose Monitoring Suppl (ACCU-CHEK GUIDE) w/Device KIT Use  daily to check blood sugars   cetirizine (ZYRTEC) 10 MG tablet Take 1 tablet (10 mg total) by mouth daily. For allergies   clonazePAM (KLONOPIN) 0.5 MG tablet Take 0.5 mg by mouth daily as needed.   clotrimazole-betamethasone (LOTRISONE) cream Apply to affected area 2 times daily prn   escitalopram (LEXAPRO) 20 MG tablet TAKE 1 TABLET BY MOUTH EVERY DAY   estradiol (ESTRACE) 0.5 MG tablet TAKE 1 TABLET BY MOUTH EVERY DAY   fluticasone (FLONASE) 50 MCG/ACT nasal spray PLACE 2 SPRAYS INTO BOTH NOSTRILS DAILY AS NEEDED FOR ALLERGIES OR RHINITIS.   hydrocortisone (ANUSOL-HC) 25 MG suppository Place 1 suppository (25 mg total) rectally 2 (two) times daily.   hyoscyamine (LEVSIN) 0.125 MG tablet TAKE ONE TABLET BY MOUTH EVERY 6 HOURS AS NEEDED FOR STOMACH CRAMPINGS   ipratropium (ATROVENT) 0.03 % nasal spray PLACE 2 SPRAYS INTO THE NOSE DAILY AS NEEDED.   Ipratropium-Albuterol (COMBIVENT RESPIMAT) 20-100 MCG/ACT AERS respimat INHALE 1 PUFF INTO THE LUNGS EVERY 4 HOURS AS NEEDED FOR WHEEZING.   linaclotide (LINZESS) 145 MCG CAPS capsule Take 1 capsule (145 mcg total) by mouth daily before breakfast.   montelukast (SINGULAIR) 10 MG tablet TAKE 1 TABLET BY MOUTH EVERYDAY AT BEDTIME   Multiple Vitamin (MULTIVITAMIN WITH MINERALS) TABS tablet Take 2 tablets by mouth daily.   naproxen (NAPROSYN) 500 MG tablet TAKE 1 TABLET (500 MG TOTAL) BY MOUTH 2 (TWO) TIMES DAILY WITH A MEAL. AS NEEDED FOR PAIN   nortriptyline (PAMELOR) 25 MG capsule  TAKE ONE CAPSULE AT BEDTIME FOR MIGRANE PREVENTION AND IRRITABLE BOWEL SYNDROME   Olopatadine HCl 0.2 % SOLN Apply 1 drop to eye daily.   ondansetron (ZOFRAN-ODT) 4 MG disintegrating tablet Take 1 tablet (4 mg total) by mouth every 8 (eight) hours as needed for nausea or vomiting.   OXYGEN Inhale 2 L into the lungs at bedtime as needed.   pantoprazole (PROTONIX) 40 MG tablet Take 1 tablet (40 mg total) by mouth daily. For acid reflux   promethazine (PHENERGAN) 12.5 MG tablet  Take 1 tablet (12.5 mg total) by mouth every 8 (eight) hours as needed for nausea or vomiting.   promethazine-dextromethorphan (PROMETHAZINE-DM) 6.25-15 MG/5ML syrup Take 5 mLs by mouth 4 (four) times daily as needed for cough.   Spacer/Aero-Holding Chambers (AEROCHAMBER PLUS) inhaler Use with inhaler   sucralfate (CARAFATE) 1 g tablet Take 1 tablet (1 g total) by mouth 4 (four) times daily -  with meals and at bedtime.   triamcinolone ointment (KENALOG) 0.5 % Apply 1 Application topically 2 (two) times daily.   Ubrogepant (UBRELVY) 50 MG TABS Take 50 mg by mouth once as needed for up to 1 dose (migraines, headaches).   zolpidem (AMBIEN) 10 MG tablet TAKE 1 TABLET BY MOUTH EVERYDAY AT BEDTIME   predniSONE (DELTASONE) 50 MG tablet Take 1 tablet (50 mg total) by mouth daily for 5 days. (Patient not taking: Reported on 03/23/2022)   promethazine-dextromethorphan (PROMETHAZINE-DM) 6.25-15 MG/5ML syrup Take 5 mLs by mouth 4 (four) times daily as needed.   [DISCONTINUED] amoxicillin-clavulanate (AUGMENTIN) 875-125 MG tablet Take 1 tablet by mouth 2 (two) times daily. Please, let pt know if symptoms will subside after 5 days to stop taking abx. Take abx with meals and probiotics. Thank you   No facility-administered medications prior to visit.    Review of Systems     Objective    BP 117/69 (BP Location: Right Arm, Patient Position: Sitting, Cuff Size: Normal)   Pulse 74   Resp 18   Wt 142 lb 4.8 oz (64.5 kg)   SpO2 100%   BMI 30.79 kg/m    Physical Exam Vitals reviewed.  Constitutional:      General: She is not in acute distress.    Appearance: Normal appearance. She is not ill-appearing, toxic-appearing or diaphoretic.  HENT:     Right Ear: No drainage or tenderness. A middle ear effusion is present. Tympanic membrane is not erythematous.     Left Ear: No drainage or tenderness.  No middle ear effusion. Tympanic membrane is not erythematous.     Nose:     Right Turbinates: Enlarged and  swollen.     Right Sinus: Maxillary sinus tenderness present. No frontal sinus tenderness.     Left Sinus: Maxillary sinus tenderness present. No frontal sinus tenderness.     Mouth/Throat:     Pharynx: Posterior oropharyngeal erythema present. No oropharyngeal exudate.  Eyes:     Conjunctiva/sclera: Conjunctivae normal.  Cardiovascular:     Rate and Rhythm: Normal rate and regular rhythm.     Pulses: Normal pulses.     Heart sounds: Normal heart sounds. No murmur heard.    No friction rub. No gallop.  Pulmonary:     Effort: Pulmonary effort is normal. No respiratory distress.     Breath sounds: Normal breath sounds. No stridor. No wheezing, rhonchi or rales.  Abdominal:     General: Bowel sounds are normal. There is no distension.     Palpations: Abdomen is soft.  Tenderness: There is no abdominal tenderness.  Musculoskeletal:     Right lower leg: No edema.     Left lower leg: No edema.  Skin:    Findings: No erythema or rash.  Neurological:     Mental Status: She is alert and oriented to person, place, and time.      No results found for any visits on 03/23/22.  Assessment & Plan     Problem List Items Addressed This Visit       Respiratory   Sinusitis    Acute problem  Patient has stable vitals signs wnl  Will treat as recurrent sinusitis  Patient still recovering from COVID 19 infection  Counseled on post infectious cough potentially lasting for 2-3 months or longer  Will treat with Augmentin 875-11m twice daily for 10 day course due to sinus tenderness and increased purulent nasal drainage  Recommended patient return in 2 weeks if symptoms do not improve  Recommended ED evaluation if she becomes lightheaded or sob or has chest pain with breathing  Patient has pulse oximeter, recommended she keep oxygen saturation above 90% otherwise needs to be seen urgently for medical evaluation       Relevant Medications   amoxicillin-clavulanate (AUGMENTIN) 875-125 MG  tablet     Return in about 2 weeks (around 04/06/2022), or if symptoms worsen or fail to improve.        The entirety of the information documented in the History of Present Illness, Review of Systems and Physical Exam were personally obtained by me. Portions of this information were initially documented by JLyndel Pleasure CMA and reviewed by me for thoroughness and accuracy.MEulis Foster MD     MEulis Foster MD  [phone](phone) [phone](fax)  [place]

## 2022-03-23 NOTE — Patient Instructions (Signed)
I have prescribed Augmentin to treat your sinus symptoms   Please take twice daily for the next ten days   You will need to be seen ASAP if you have trouble breathing or become lightheaded or dizziness.   Please return in two weeks if symptoms persist

## 2022-03-23 NOTE — Assessment & Plan Note (Signed)
Acute problem  Patient has stable vitals signs wnl  Will treat as recurrent sinusitis  Patient still recovering from COVID 19 infection  Counseled on post infectious cough potentially lasting for 2-3 months or longer  Will treat with Augmentin 875-194m twice daily for 10 day course due to sinus tenderness and increased purulent nasal drainage  Recommended patient return in 2 weeks if symptoms do not improve  Recommended ED evaluation if she becomes lightheaded or sob or has chest pain with breathing  Patient has pulse oximeter, recommended she keep oxygen saturation above 90% otherwise needs to be seen urgently for medical evaluation

## 2022-03-23 NOTE — Telephone Encounter (Signed)
Message from [employer] sent at 03/23/2022  8:47 AM EST  Summary: cough and congestion / rx req   The patient has been diagnosed with COVID 19 on 03/18/22  The patient has been previously prescribed prednisone and cough syrup by UC in [place]  The patient shares that they are continuing to experience congestion and green colored mucus  The patient would like to be prescribed something for their congestion  Please contact the patient further when possible         Chief Complaint: green phlegm from lungs and green nasal secretions Symptoms: SOB with exertion, hoarse Frequency: 03/18/22 Pertinent Negatives: Patient denies chest pain, fever Disposition: []$ ED /[]$ Urgent Care (no appt availability in office) / [x]$ Appointment(In office/virtual)/ []$  [facility]/ []$ Home Care/ []$ Refused Recommended Disposition /[]$ [facility]/ []$  Follow-up with PCP Additional Notes: pt wanted to be seen today- appt scheduled for this am   Reason for Disposition  [1] MILD difficulty breathing (e.g., minimal/no SOB at rest, SOB with walking, pulse <100) AND [2] still present when not coughing  Answer Assessment - Initial Assessment Questions 1. ONSET: "When did the cough begin?"      *No Answer* 2. SEVERITY: "How bad is the cough today?"      *No Answer* 3. SPUTUM: "Describe the color of your sputum" (none, dry cough; clear, white, yellow, green)     Green  4. HEMOPTYSIS: "Are you coughing up any blood?" If so ask: "How much?" (flecks, streaks, tablespoons, etc.)     Dot or two blood 5. DIFFICULTY BREATHING: "Are you having difficulty breathing?" If Yes, ask: "How bad is it?" (e.g., mild, moderate, severe)    - MILD: No SOB at rest, mild SOB with walking, speaks normally in sentences, can lie down, no retractions, pulse < 100.    - MODERATE: SOB at rest, SOB with minimal exertion and prefers to sit, cannot lie down flat, speaks in phrases, mild retractions, audible wheezing, pulse  100-120.    - SEVERE: Very SOB at rest, speaks in single words, struggling to breathe, sitting hunched forward, retractions, pulse > 120      no 6. FEVER: "Do you have a fever?" If Yes, ask: "What is your temperature, how was it measured, and when did it start?"     no 7. CARDIAC HISTORY: "Do you have any history of heart disease?" (e.g., heart attack, congestive heart failure)      Murmur 8. LUNG HISTORY: "Do you have any history of lung disease?"  (e.g., pulmonary embolus, asthma, emphysema)    SOB with exertion 9. PE RISK FACTORS: "Do you have a history of blood clots?" (or: recent major surgery, recent prolonged travel, bedridden)     No 10. OTHER SYMPTOMS: "Do you have any other symptoms?" (e.g., runny nose, wheezing, chest pain)       Runny nose, sinus pressure, green out of nose 11. PREGNANCY: "Is there any chance you are pregnant?" "When was your last menstrual period?"       N/a 12. TRAVEL: "Have you traveled out of the country in the last month?" (e.g., travel history, exposures)       N/a  Protocols used: Cough - Acute Productive-A-AH

## 2022-04-12 ENCOUNTER — Other Ambulatory Visit: Payer: Self-pay | Admitting: Family Medicine

## 2022-04-12 DIAGNOSIS — J301 Allergic rhinitis due to pollen: Secondary | ICD-10-CM

## 2022-04-13 NOTE — Telephone Encounter (Signed)
Requested medication (s) are due for refill today: no  Requested medication (s) are on the active medication list: yes  Last refill:  03/19/22 30 ml 1 RF  Future visit scheduled: no  Notes to clinic:  med not assigned to a protocol   Requested Prescriptions  Pending Prescriptions Disp Refills   ipratropium (ATROVENT) 0.03 % nasal spray [Pharmacy Med Name: IPRATROPIUM 0.03% SPRAY]  1    Sig: PLACE 2 SPRAYS INTO THE NOSE DAILY AS NEEDED.     Off-Protocol Failed - 04/12/2022 12:30 PM      Failed - Medication not assigned to a protocol, review manually.      Passed - Valid encounter within last 12 months    Recent Outpatient Visits           3 weeks ago Sinusitis, unspecified chronicity, unspecified location   [place] Simmons[place]Robinson, [place], MD   1 month ago Cough, unspecified type   [place], [place], PA-C   2 months ago Eyelid dermatitis, eczematous, left   [place] Tally Joe T, FNP   2 months ago Allergic conjunctivitis of both eyes   [place], DO   4 months ago Annual physical exam   [facility] Birdie Sons, MD             Off-Protocol Failed - 04/12/2022 12:30 PM      Failed - Medication not assigned to a protocol, review manually.      Passed - Valid encounter within last 12 months    Recent Outpatient Visits           3 weeks ago Sinusitis, unspecified chronicity, unspecified location   [place], [place], MD   1 month ago Cough, unspecified type   [place], [place], PA-C   2 months ago Eyelid dermatitis, eczematous, left   [place] Tally Joe T, FNP   2 months ago Allergic conjunctivitis of both eyes   [place], [place], DO   4  months ago Annual physical exam   [facility] Birdie Sons, MD

## 2022-05-13 ENCOUNTER — Other Ambulatory Visit: Payer: Self-pay | Admitting: Family Medicine

## 2022-05-13 DIAGNOSIS — J301 Allergic rhinitis due to pollen: Secondary | ICD-10-CM

## 2022-06-02 ENCOUNTER — Ambulatory Visit
Admission: EM | Admit: 2022-06-02 | Discharge: 2022-06-02 | Disposition: A | Discharge status: Home / Self Care | Payer: Medicaid Other | Attending: Emergency Medicine | Admitting: Emergency Medicine

## 2022-06-02 DIAGNOSIS — S40862A Insect bite (nonvenomous) of left upper arm, initial encounter: Secondary | ICD-10-CM

## 2022-06-02 DIAGNOSIS — W57XXXA Bitten or stung by nonvenomous insect and other nonvenomous arthropods, initial encounter: Secondary | ICD-10-CM

## 2022-06-02 DIAGNOSIS — H00012 Hordeolum externum right lower eyelid: Secondary | ICD-10-CM

## 2022-06-02 MED ORDER — ONDANSETRON 8 MG PO TBDP: 8.0000 mg | ORAL_TABLET | Freq: Two times a day (BID) | ORAL | 0 refills | Status: DC

## 2022-06-02 MED ORDER — DOXYCYCLINE HYCLATE 100 MG PO CAPS: 100.0000 mg | ORAL_CAPSULE | Freq: Two times a day (BID) | ORAL | 0 refills | Status: DC

## 2022-06-02 MED ORDER — BACITRACIN ZINC 500 UNIT/GM EX OINT: 1.0000 | TOPICAL_OINTMENT | Freq: Two times a day (BID) | CUTANEOUS | 0 refills | Status: DC

## 2022-06-02 NOTE — Discharge Instructions (Addendum)
Continue to apply warm compresses.  Perform eyelid hygiene with baby shampoo in a rich lather and vigorous scrubbing of your eyelids twice daily. Rinse thoroughly with water.  Apply Bacitracin ointment to the eyelid margin with a clean Q-tip twice daily, and a 1/2 inch ribbon to the inside of the lower lid. Blink several times to liquify the ointment and spread it ober the inside of your eyelids.  Take the Doxycycline twice daily for treatment of your Tick bite.Take with food  Take a zofran tablet 20 minutes before each dose of Doxycycline to prevent nausea and vomiting.  If you cannot tolerate the Doxycycline call your PCP regarding the compounded medication you have been treated with in the past.  If your symptoms do not improve, or you develop changes in your vision follow up with your eye doctor.
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
## 2022-06-02 NOTE — ED Triage Notes (Signed)
Pt c/o R eye itchiness & redness x4 days, also c/o insect bite in L arm x4 days, c/o itchiness & pain as well. Concerned about lyme disease. Has tried otc eye drops, benadryl & neosporin to bite w/o relief.

## 2022-06-20 ENCOUNTER — Other Ambulatory Visit: Payer: Self-pay | Admitting: Family Medicine

## 2022-06-20 DIAGNOSIS — H01136 Eczematous dermatitis of left eye, unspecified eyelid: Secondary | ICD-10-CM

## 2022-06-21 ENCOUNTER — Other Ambulatory Visit: Payer: Self-pay | Admitting: Family Medicine

## 2022-06-21 DIAGNOSIS — J301 Allergic rhinitis due to pollen: Secondary | ICD-10-CM

## 2022-06-22 NOTE — Telephone Encounter (Signed)
Requested medications are due for refill today.  yes  Requested medications are on the active medications list.  yes  Last refill. 01/30/2022 30 g 0 rf  Future visit scheduled.   no  Notes to clinic.  Refill not delegated.    Requested Prescriptions  Pending Prescriptions Disp Refills   triamcinolone ointment (KENALOG) 0.5 % [Pharmacy Med Name: TRIAMCINOLONE 0.5% OINTMENT] 30 g 0    Sig: APPLY TO AFFECTED AREA TWICE A DAY     Not Delegated - Dermatology:  Corticosteroids Failed - 06/20/2022 11:17 AM      Failed - This refill cannot be delegated      Passed - Valid encounter within last 12 months    Recent Outpatient Visits           3 months ago Sinusitis, unspecified chronicity, unspecified location   [facility] Simmons-Robinson, [place], MD   3 months ago Cough, unspecified type   [facility] [place], [place], PA-C   4 months ago Eyelid dermatitis, eczematous, left   [facility] Merita Norton T, FNP   5 months ago Allergic conjunctivitis of both eyes   [facility] Caro Laroche, DO   7 months ago Annual physical exam   [facility] Malva Limes, MD

## 2022-06-23 NOTE — Telephone Encounter (Signed)
Requested Prescriptions  Pending Prescriptions Disp Refills   cetirizine (ZYRTEC) 10 MG tablet [Pharmacy Med Name: CETIRIZINE HCL 10 MG TABLET] 90 tablet 1    Sig: TAKE 1 TABLET (10 MG TOTAL) BY MOUTH DAILY. FOR ALLERGIES     Ear, Nose, and Throat:  Antihistamines 2 Passed - 06/21/2022 12:07 PM      Passed - Cr in normal range and within 360 days    Creatinine  Date Value Ref Range Status  10/02/2013 0.85 0.60 - 1.30 mg/dL Final   Creatinine, Ser  Date Value Ref Range Status  11/06/2021 0.71 0.57 - 1.00 mg/dL Final         Passed - Valid encounter within last 12 months    Recent Outpatient Visits           3 months ago Sinusitis, unspecified chronicity, unspecified location   [facility] Simmons-Robinson, [place], MD   3 months ago Cough, unspecified type   [facility] [place], [place], PA-C   4 months ago Eyelid dermatitis, eczematous, left   [facility] Merita Norton T, FNP   5 months ago Allergic conjunctivitis of both eyes   [facility] Caro Laroche, DO   7 months ago Annual physical exam   [facility] Sherrie Mustache, Demetrios Isaacs, MD

## 2022-07-11 ENCOUNTER — Other Ambulatory Visit: Payer: Self-pay | Admitting: Family Medicine

## 2022-07-13 NOTE — Telephone Encounter (Signed)
Requested Prescriptions  Pending Prescriptions Disp Refills   pantoprazole (PROTONIX) 40 MG tablet [Pharmacy Med Name: PANTOPRAZOLE SOD DR 40 MG TAB] 90 tablet 0    Sig: TAKE 1 TABLET (40 MG TOTAL) BY MOUTH DAILY. FOR ACID REFLUX     Gastroenterology: Proton Pump Inhibitors Passed - 07/11/2022  9:40 AM      Passed - Valid encounter within last 12 months    Recent Outpatient Visits           3 months ago Sinusitis, unspecified chronicity, unspecified location   [facility] Simmons-Robinson, [place], MD   4 months ago Cough, unspecified type   [facility] [place], [place], PA-C   5 months ago Eyelid dermatitis, eczematous, left   [facility] Merita Norton T, FNP   5 months ago Allergic conjunctivitis of both eyes   [facility] Caro Laroche, DO   7 months ago Annual physical exam   [facility] Malva Limes, MD

## 2022-07-20 NOTE — Progress Notes (Signed)
This encounter was created in error - please disregard.

## 2022-07-22 ENCOUNTER — Other Ambulatory Visit: Payer: Self-pay | Admitting: Family Medicine

## 2022-07-22 ENCOUNTER — Telehealth: Payer: Self-pay | Admitting: Family Medicine

## 2022-07-22 NOTE — Telephone Encounter (Signed)
Medication Refill - Medication: VENTOLIN HFA 108 (90 Base) MCG/ACT inhaler [Pharmacy Med Name: VENTOLIN HFA 90 MCG INHALER]   Pt states that she will be going out of town tomorrow for  a week and is needing this prescription before she leave.     Has the patient contacted their pharmacy? Yes.    Preferred Pharmacy (with phone number or street name): [id number] - [place], [place] - [address]  Phone: [phone] Fax: [phone]  Has the patient been seen for an appointment in the last year OR does the patient have an upcoming appointment? Yes.   Agent: Please be advised that RX refills may take up to 3 business days. We ask that you follow-up with your pharmacy.

## 2022-07-22 NOTE — Telephone Encounter (Signed)
Requested medication (s) are due for refill today: review  Requested medication (s) are on the active medication list: no  Last refill:  10/23/21  Future visit scheduled: no  Notes to clinic:  rx was dc'd on 03/03/22 by Edmon Crape, PA d/t possible other medication sent in. Please for refill.      Requested Prescriptions  Pending Prescriptions Disp Refills   VENTOLIN HFA 108 (90 Base) MCG/ACT inhaler [Pharmacy Med Name: VENTOLIN HFA 90 MCG INHALER] 18 each 5    Sig: TAKE 2 PUFFS BY MOUTH EVERY 6 HOURS AS NEEDED FOR WHEEZE OR SHORTNESS OF BREATH     Pulmonology:  Beta Agonists 2 Passed - 07/22/2022  2:10 PM      Passed - Last BP in normal range    BP Readings from Last 1 Encounters:  06/02/22 96/63         Passed - Last Heart Rate in normal range    Pulse Readings from Last 1 Encounters:  06/02/22 79         Passed - Valid encounter within last 12 months    Recent Outpatient Visits           4 months ago Sinusitis, unspecified chronicity, unspecified location   [facility] Simmons-Robinson, [place], MD   4 months ago Cough, unspecified type   [facility] [place], [place], PA-C   5 months ago Eyelid dermatitis, eczematous, left   [facility] Merita Norton T, FNP   6 months ago Allergic conjunctivitis of both eyes   [facility] Caro Laroche, DO   8 months ago Annual physical exam   [facility] Malva Limes, MD

## 2022-07-31 ENCOUNTER — Telehealth: Payer: Self-pay

## 2022-07-31 DIAGNOSIS — K2950 Unspecified chronic gastritis without bleeding: Secondary | ICD-10-CM

## 2022-07-31 DIAGNOSIS — K529 Noninfective gastroenteritis and colitis, unspecified: Secondary | ICD-10-CM

## 2022-07-31 NOTE — Telephone Encounter (Signed)
Copied from CRM [id number]. Topic: Referral - Request for Referral >> Jul 31, 2022 12:37 PM Patsy Lager T wrote: Has patient seen PCP for this complaint? Yes.   *If NO, is insurance requiring patient see PCP for this issue before PCP can refer them? Referral for which specialty: Gastroenterologist Preferred provider/office: unknown Reason for referral: colonscopy

## 2022-08-04 ENCOUNTER — Other Ambulatory Visit: Payer: Self-pay | Admitting: Family Medicine

## 2022-08-04 DIAGNOSIS — N951 Menopausal and female climacteric states: Secondary | ICD-10-CM

## 2022-08-04 DIAGNOSIS — Z79890 Hormone replacement therapy (postmenopausal): Secondary | ICD-10-CM

## 2022-08-04 NOTE — Telephone Encounter (Signed)
Medication Refill - Medication: estradiol (ESTRACE) 0.5 MG tablet    Has the patient contacted their pharmacy? Yes.     Preferred Pharmacy (with phone number or street name):  [id number] - [place], [place] - [address] Phone: [phone]  Fax: [phone]      Has the patient been seen for an appointment in the last year OR does the patient have an upcoming appointment? Yes.    Please assist patient further

## 2022-08-04 NOTE — Telephone Encounter (Signed)
She is not due for screening colonoscopy until 2029

## 2022-08-05 MED ORDER — ESTRADIOL 0.5 MG PO TABS: 0.5000 mg | ORAL_TABLET | Freq: Every day | ORAL | 0 refills | Status: DC

## 2022-08-05 NOTE — Telephone Encounter (Signed)
Requested Prescriptions  Pending Prescriptions Disp Refills   estradiol (ESTRACE) 0.5 MG tablet 90 tablet 0    Sig: Take 1 tablet (0.5 mg total) by mouth daily.     OB/GYN:  Estrogens Passed - 08/05/2022 12:12 PM      Passed - Mammogram is up-to-date per Health Maintenance      Passed - Last BP in normal range    BP Readings from Last 1 Encounters:  06/02/22 96/63         Passed - Valid encounter within last 12 months    Recent Outpatient Visits           4 months ago Sinusitis, unspecified chronicity, unspecified location   [facility] Simmons-Robinson, [place], MD   5 months ago Cough, unspecified type   [facility] [place], [place], PA-C   6 months ago Eyelid dermatitis, eczematous, left   [facility] Merita Norton T, FNP   6 months ago Allergic conjunctivitis of both eyes   [facility] Caro Laroche, DO   8 months ago Annual physical exam   [facility] Malva Limes, MD

## 2022-08-10 NOTE — Addendum Note (Signed)
Addended by: Malva Limes on: 08/10/2022 12:29 PM   Modules accepted: Orders

## 2022-08-10 NOTE — Telephone Encounter (Signed)
She has history of ulcerative colitis. She saw Dr. Markham Jordan at [facility] and Dr Maximino Greenland and [facility] in the past, but neither of them are here anymore. Does she prefer to go go [facility] or [facility] to see a new gastroenterologist.

## 2022-08-27 ENCOUNTER — Other Ambulatory Visit: Payer: Self-pay | Admitting: Family Medicine

## 2022-08-27 ENCOUNTER — Telehealth: Payer: Medicaid Other | Admitting: Physician Assistant

## 2022-08-27 ENCOUNTER — Ambulatory Visit: Payer: Self-pay

## 2022-08-27 DIAGNOSIS — J019 Acute sinusitis, unspecified: Secondary | ICD-10-CM

## 2022-08-27 DIAGNOSIS — Z79890 Hormone replacement therapy (postmenopausal): Secondary | ICD-10-CM

## 2022-08-27 DIAGNOSIS — B9689 Other specified bacterial agents as the cause of diseases classified elsewhere: Secondary | ICD-10-CM | POA: Diagnosis not present

## 2022-08-27 DIAGNOSIS — N951 Menopausal and female climacteric states: Secondary | ICD-10-CM

## 2022-08-27 MED ORDER — PROMETHAZINE-DM 6.25-15 MG/5ML PO SYRP: 5.0000 mL | ORAL_SOLUTION | Freq: Four times a day (QID) | ORAL | 0 refills | Status: DC | PRN

## 2022-08-27 MED ORDER — AMOXICILLIN-POT CLAVULANATE 875-125 MG PO TABS: 1.0000 | ORAL_TABLET | Freq: Two times a day (BID) | ORAL | 0 refills | Status: DC

## 2022-08-27 NOTE — Telephone Encounter (Signed)
Chief Complaint: Sore throat  Symptoms: sore throat, runny nose, productive cough, chills Frequency: onset about a week ago started with runny nose and progressively getting worse Pertinent Negatives: Patient denies SOB, chest pain, nausea, vomiting (negative covid test) Disposition: [] ED /[] Urgent Care (no appt availability in office) / [] Appointment(In office/virtual)/ [x]  [facility]/ [] Home Care/ [] Refused Recommended Disposition /[] [facility]/ []  Follow-up with PCP Additional Notes: Patient stated that she started off with a runny nose last week and things have progressively gotten worse. Patient reports a runny nose, productive cough, sore throat, and chills. Patient states she feels like this when she has a sinus infection. Advised patient that she would need to be evaluated. No availability in the office until next week. Patient was agreeable to do a virtual urgent care visit today at 1300. Summary: Possible sinus infection symptoms, no appt soon enough   Pt called reporting symptoms, she believes she has a sinus infection. She has congestion, dark green phlegm, cough, sore throat, chills/aches. Seeking an appt or Rx, nothing available soon enough  Best contact: [phone]     Reason for Disposition  SEVERE (e.g., excruciating) throat pain  Answer Assessment - Initial Assessment Questions 1. ONSET: "When did the throat start hurting?" (Hours or days ago)      Onset about 1 week ago 2. SEVERITY: "How bad is the sore throat?" (Scale 1-10; mild, moderate or severe)   - MILD (1-3):  Doesn't interfere with eating or normal activities.   - MODERATE (4-7): Interferes with eating some solids and normal activities.   - SEVERE (8-10):  Excruciating pain, interferes with most normal activities.   - SEVERE WITH DYSPHAGIA (10): Can't swallow liquids, drooling.     8/10 3. STREP EXPOSURE: "Has there been any exposure to strep within the past week?" If Yes, ask: "What type  of contact occurred?"      No 4.  VIRAL SYMPTOMS: "Are there any symptoms of a cold, such as a runny nose, cough, hoarse voice or red eyes?"      Runny nose, cough, hoarse voice 5. FEVER: "Do you have a fever?" If Yes, ask: "What is your temperature, how was it measured, and when did it start?"     No 6. PUS ON THE TONSILS: "Is there pus on the tonsils in the back of your throat?"     No, pus but it is red 7. OTHER SYMPTOMS: "Do you have any other symptoms?" (e.g., difficulty breathing, headache, rash)     Chills and green phlegm  Protocols used: Sore Throat-A-AH

## 2022-08-27 NOTE — Patient Instructions (Signed)
Erin Richards, thank you for joining Piedad Climes, PA-C for today's virtual visit.  While this provider is not your primary care provider (PCP), if your PCP is located in our provider database this encounter information will be shared with them immediately following your visit.   A [facility] MyChart account gives you access to today's visit and all your visits, tests, and labs performed at [facility] " click here if you don't have a [facility] MyChart account or go to [email or web address]  Consent: (Patient) Erin Richards provided verbal consent for this virtual visit at the beginning of the encounter.  Current Medications:  Current Outpatient Medications:    ACCU-CHEK GUIDE test strip, USE UP TO FOUR TIMES DAILY AS DIRECTED., Disp: 400 strip, Rfl: 0   Accu-Chek Softclix Lancets lancets, Use to check blood sugar daily, Disp: 100 each, Rfl: 4   ALPRAZolam (XANAX) 0.5 MG tablet, Take 0.5-1 tablets (0.25-0.5 mg total) by mouth 2 (two) times daily as needed (pain attacks)., Disp: 30 tablet, Rfl: 2   Azelastine HCl 137 MCG/SPRAY SOLN, PLACE 1 SPRAY INTO BOTH NOSTRILS 2 (TWO) TIMES DAILY, Disp: 90 mL, Rfl: 0   bacitracin ointment, Apply 1 Application topically 2 (two) times daily., Disp: 120 g, Rfl: 0   bacitracin-polymyxin b (POLYSPORIN) ophthalmic ointment, Place 1 Application into the left eye 3 (three) times daily., Disp: 3.5 g, Rfl: 0   Blood Glucose Monitoring Suppl (ACCU-CHEK GUIDE) w/Device KIT, Use daily to check blood sugars, Disp: 1 kit, Rfl: 0   cetirizine (ZYRTEC) 10 MG tablet, TAKE 1 TABLET (10 MG TOTAL) BY MOUTH DAILY. FOR ALLERGIES, Disp: 90 tablet, Rfl: 1   clonazePAM (KLONOPIN) 0.5 MG tablet, Take 0.5 mg by mouth daily as needed., Disp: , Rfl:    clotrimazole-betamethasone (LOTRISONE) cream, Apply to affected area 2 times daily prn, Disp: 15 g, Rfl: 0   doxycycline (VIBRAMYCIN) 100 MG capsule, Take 1 capsule (100 mg total) by mouth 2 (two) times daily.,  Disp: 20 capsule, Rfl: 0   escitalopram (LEXAPRO) 20 MG tablet, TAKE 1 TABLET BY MOUTH EVERY DAY, Disp: 90 tablet, Rfl: 4   estradiol (ESTRACE) 0.5 MG tablet, Take 1 tablet (0.5 mg total) by mouth daily., Disp: 90 tablet, Rfl: 0   fluticasone (FLONASE) 50 MCG/ACT nasal spray, PLACE 2 SPRAYS INTO BOTH NOSTRILS DAILY AS NEEDED FOR ALLERGIES OR RHINITIS., Disp: 48 mL, Rfl: 1   hydrocortisone (ANUSOL-HC) 25 MG suppository, Place 1 suppository (25 mg total) rectally 2 (two) times daily., Disp: 12 suppository, Rfl: 0   hyoscyamine (LEVSIN) 0.125 MG tablet, TAKE ONE TABLET BY MOUTH EVERY 6 HOURS AS NEEDED FOR STOMACH CRAMPINGS, Disp: 20 tablet, Rfl: 1   ipratropium (ATROVENT) 0.03 % nasal spray, PLACE 2 SPRAYS INTO THE NOSE DAILY AS NEEDED., Disp: 90 mL, Rfl: 1   Ipratropium-Albuterol (COMBIVENT RESPIMAT) 20-100 MCG/ACT AERS respimat, INHALE 1 PUFF INTO THE LUNGS EVERY 4 HOURS AS NEEDED FOR WHEEZING., Disp: 4 each, Rfl: 1   linaclotide (LINZESS) 145 MCG CAPS capsule, Take 1 capsule (145 mcg total) by mouth daily before breakfast., Disp: 30 capsule, Rfl: 1   montelukast (SINGULAIR) 10 MG tablet, TAKE 1 TABLET BY MOUTH EVERYDAY AT BEDTIME, Disp: 90 tablet, Rfl: 3   Multiple Vitamin (MULTIVITAMIN WITH MINERALS) TABS tablet, Take 2 tablets by mouth daily., Disp: , Rfl:    naproxen (NAPROSYN) 500 MG tablet, TAKE 1 TABLET (500 MG TOTAL) BY MOUTH 2 (TWO) TIMES DAILY WITH A MEAL. AS NEEDED FOR PAIN, Disp: 30  tablet, Rfl: 3   nortriptyline (PAMELOR) 25 MG capsule, TAKE ONE CAPSULE AT BEDTIME FOR MIGRANE PREVENTION AND IRRITABLE BOWEL SYNDROME, Disp: 90 capsule, Rfl: 3   Olopatadine HCl 0.2 % SOLN, Apply 1 drop to eye daily., Disp: 2.5 mL, Rfl: 0   ondansetron (ZOFRAN-ODT) 8 MG disintegrating tablet, Take 1 tablet (8 mg total) by mouth 2 (two) times daily., Disp: 20 tablet, Rfl: 0   OXYGEN, Inhale 2 L into the lungs at bedtime as needed., Disp: , Rfl:    pantoprazole (PROTONIX) 40 MG tablet, TAKE 1 TABLET (40 MG  TOTAL) BY MOUTH DAILY. FOR ACID REFLUX, Disp: 90 tablet, Rfl: 0   promethazine (PHENERGAN) 12.5 MG tablet, Take 1 tablet (12.5 mg total) by mouth every 8 (eight) hours as needed for nausea or vomiting., Disp: 20 tablet, Rfl: 2   promethazine-dextromethorphan (PROMETHAZINE-DM) 6.25-15 MG/5ML syrup, Take 5 mLs by mouth 4 (four) times daily as needed., Disp: 118 mL, Rfl: 0   promethazine-dextromethorphan (PROMETHAZINE-DM) 6.25-15 MG/5ML syrup, Take 5 mLs by mouth 4 (four) times daily as needed for cough., Disp: 180 mL, Rfl: 0   Spacer/Aero-Holding Chambers (AEROCHAMBER PLUS) inhaler, Use with inhaler, Disp: 1 each, Rfl: 2   sucralfate (CARAFATE) 1 g tablet, Take 1 tablet (1 g total) by mouth 4 (four) times daily -  with meals and at bedtime., Disp: 120 tablet, Rfl: 0   triamcinolone ointment (KENALOG) 0.5 %, APPLY TO AFFECTED AREA TWICE A DAY, Disp: 30 g, Rfl: 1   Ubrogepant (UBRELVY) 50 MG TABS, Take 50 mg by mouth once as needed for up to 1 dose (migraines, headaches)., Disp: 30 tablet, Rfl: 1   zolpidem (AMBIEN) 10 MG tablet, TAKE 1 TABLET BY MOUTH EVERYDAY AT BEDTIME, Disp: 30 tablet, Rfl: 3   Medications ordered in this encounter:  No orders of the defined types were placed in this encounter.    *If you need refills on other medications prior to your next appointment, please contact your pharmacy*  Follow-Up: Call back or seek an in-person evaluation if the symptoms worsen or if the condition fails to improve as anticipated.  [facility] [phone]  Other Instructions Please take antibiotic as directed.  Increase fluid intake.  Use Saline nasal spray.  Take a daily multivitamin. Use the cough syrup as directed.  Place a humidifier in the bedroom.  Please call or return clinic if symptoms are not improving.  Sinusitis Sinusitis is redness, soreness, and swelling (inflammation) of the paranasal sinuses. Paranasal sinuses are air pockets within the bones of your face (beneath  the eyes, the middle of the forehead, or above the eyes). In healthy paranasal sinuses, mucus is able to drain out, and air is able to circulate through them by way of your nose. However, when your paranasal sinuses are inflamed, mucus and air can become trapped. This can allow bacteria and other germs to grow and cause infection. Sinusitis can develop quickly and last only a short time (acute) or continue over a long period (chronic). Sinusitis that lasts for more than 12 weeks is considered chronic.  CAUSES  Causes of sinusitis include: Allergies. Structural abnormalities, such as displacement of the cartilage that separates your nostrils (deviated septum), which can decrease the air flow through your nose and sinuses and affect sinus drainage. Functional abnormalities, such as when the small hairs (cilia) that line your sinuses and help remove mucus do not work properly or are not present. SYMPTOMS  Symptoms of acute and chronic sinusitis are  the same. The primary symptoms are pain and pressure around the affected sinuses. Other symptoms include: Upper toothache. Earache. Headache. Bad breath. Decreased sense of smell and taste. A cough, which worsens when you are lying flat. Fatigue. Fever. Thick drainage from your nose, which often is green and may contain pus (purulent). Swelling and warmth over the affected sinuses. DIAGNOSIS  Your caregiver will perform a physical exam. During the exam, your caregiver may: Look in your nose for signs of abnormal growths in your nostrils (nasal polyps). Tap over the affected sinus to check for signs of infection. View the inside of your sinuses (endoscopy) with a special imaging device with a light attached (endoscope), which is inserted into your sinuses. If your caregiver suspects that you have chronic sinusitis, one or more of the following tests may be recommended: Allergy tests. Nasal culture A sample of mucus is taken from your nose and sent to  a lab and screened for bacteria. Nasal cytology A sample of mucus is taken from your nose and examined by your caregiver to determine if your sinusitis is related to an allergy. TREATMENT  Most cases of acute sinusitis are related to a viral infection and will resolve on their own within 10 days. Sometimes medicines are prescribed to help relieve symptoms (pain medicine, decongestants, nasal steroid sprays, or saline sprays).  However, for sinusitis related to a bacterial infection, your caregiver will prescribe antibiotic medicines. These are medicines that will help kill the bacteria causing the infection.  Rarely, sinusitis is caused by a fungal infection. In theses cases, your caregiver will prescribe antifungal medicine. For some cases of chronic sinusitis, surgery is needed. Generally, these are cases in which sinusitis recurs more than 3 times per year, despite other treatments. HOME CARE INSTRUCTIONS  Drink plenty of water. Water helps thin the mucus so your sinuses can drain more easily. Use a humidifier. Inhale steam 3 to 4 times a day (for example, sit in the bathroom with the shower running). Apply a warm, moist washcloth to your face 3 to 4 times a day, or as directed by your caregiver. Use saline nasal sprays to help moisten and clean your sinuses. Take over-the-counter or prescription medicines for pain, discomfort, or fever only as directed by your caregiver. SEEK IMMEDIATE MEDICAL CARE IF: You have increasing pain or severe headaches. You have nausea, vomiting, or drowsiness. You have swelling around your face. You have vision problems. You have a stiff neck. You have difficulty breathing. MAKE SURE YOU:  Understand these instructions. Will watch your condition. Will get help right away if you are not doing well or get worse. Document Released: 01/19/2005 Document Revised: 04/13/2011 Document Reviewed: 02/03/2011 [facility] Patient Information 2014 [place], [place].    If  you have been instructed to have an in-person evaluation today at a local Urgent Care facility, please use the link below. It will take you to a list of all of our available [facility], including address, phone number and hours of operation. Please do not delay care.  [facility]  If you or a family member do not have a primary care provider, use the link below to schedule a visit and establish care. When you choose a [facility] primary care physician or advanced practice provider, you gain a long-term partner in health. Find a Primary Care Provider  Learn more about [facility]'s in-office and virtual care options: [facility] - Get Care Now
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
## 2022-09-16 ENCOUNTER — Telehealth: Payer: Self-pay | Admitting: Family Medicine

## 2022-09-16 DIAGNOSIS — R109 Unspecified abdominal pain: Secondary | ICD-10-CM

## 2022-09-16 NOTE — Telephone Encounter (Signed)
Medication Refill - Medication: hyoscyamine (LEVSIN) 0.125 MG tablet   Has the patient contacted their pharmacy? Yes.     Preferred Pharmacy (with phone number or street name):  [id number] - [place], [place] - [address]   Has the patient been seen for an appointment in the last year OR does the patient have an upcoming appointment? Yes.    pt states she is having a flair up of her IBS.  Pt states Dr Sherrie Mustache is aware she has this

## 2022-09-18 ENCOUNTER — Telehealth: Payer: Self-pay | Admitting: Family Medicine

## 2022-09-18 MED ORDER — HYOSCYAMINE SULFATE 0.125 MG PO TABS: ORAL_TABLET | ORAL | 3 refills | Status: DC

## 2022-09-18 NOTE — Telephone Encounter (Signed)
Patient called stated the insurance is asking for an appeal in order to get medication filled for the hyoscyamine (LEVSIN) 0.125 MG tablet. It can bee faxed to [phone]

## 2022-09-25 ENCOUNTER — Other Ambulatory Visit: Payer: Self-pay | Admitting: Family Medicine

## 2022-09-28 ENCOUNTER — Ambulatory Visit: Payer: Self-pay

## 2022-09-28 NOTE — Telephone Encounter (Signed)
Chief Complaint: Burning during Urination  Symptoms: burning odor and pain during urination Frequency: constant  Pertinent Negatives: Patient denies discharge, blood in urine Disposition: [] ED /[] Urgent Care (no appt availability in office) / [] Appointment(In office/virtual)/ []  [facility]/ [] Home Care/ [x] Refused Recommended Disposition /[] [facility]/ []  Follow-up with PCP Additional Notes: Patient stated that she noticed slight burning with urination over the weekend with a foul odor. Patient also reported dark urine today and pain during urination reporting pain 3/10 on pain scale. Patient stated that she things she may have a UTI. Care advise given and offered patient an appointment with her PCP tomorrow. Patient declined the appointment staying she has to work. Due to her work schedule an UC appointment was offered and patient declined as well. Patient stated she would go to UC if she felt worse.   Summary: possible UTI w/blood in urine   Patient called stated she thinks she has a UTI as she is having pain when she urine. She also feels like it was blood in her urine as it was really dark. Please f/u with patient         Reason for Disposition  Bad or foul-smelling urine  Answer Assessment - Initial Assessment Questions 1. SYMPTOM: "What's the main symptom you're concerned about?" (e.g., frequency, incontinence)     Burning with urination  2. ONSET: "When did the  burning  start?"     Over the weekend 3. PAIN: "Is there any pain?" If Yes, ask: "How bad is it?" (Scale: 1-10; mild, moderate, severe)     3/10 4. CAUSE: "What do you think is causing the symptoms?"     I don't know maybe a UTI 5. OTHER SYMPTOMS: "Do you have any other symptoms?" (e.g., blood in urine, fever, flank pain, pain with urination)     Burning with urination, pain with urination  Protocols used: Urinary Symptoms-A-AH

## 2022-10-01 ENCOUNTER — Telehealth: Payer: Self-pay | Admitting: Family Medicine

## 2022-10-01 NOTE — Telephone Encounter (Signed)
Received a fax from covermymeds for Ubrelvy 50mg   Key: [id number]

## 2022-10-07 ENCOUNTER — Other Ambulatory Visit: Payer: Self-pay | Admitting: Family Medicine

## 2022-10-07 DIAGNOSIS — J301 Allergic rhinitis due to pollen: Secondary | ICD-10-CM

## 2022-10-07 DIAGNOSIS — N951 Menopausal and female climacteric states: Secondary | ICD-10-CM

## 2022-10-07 DIAGNOSIS — J302 Other seasonal allergic rhinitis: Secondary | ICD-10-CM

## 2022-10-07 DIAGNOSIS — Z79890 Hormone replacement therapy (postmenopausal): Secondary | ICD-10-CM

## 2022-10-07 NOTE — Telephone Encounter (Signed)
Apologies, but our team doesn't currently handle PA's for this office. Routing back, thanks.

## 2022-10-08 NOTE — Telephone Encounter (Signed)
Requested Prescriptions  Pending Prescriptions Disp Refills   ipratropium (ATROVENT) 0.03 % nasal spray [Pharmacy Med Name: IPRATROPIUM 0.03% SPRAY]  1    Sig: PLACE 2 SPRAYS INTO THE NOSE DAILY AS NEEDED.     Off-Protocol Failed - 10/07/2022  1:59 PM      Failed - Medication not assigned to a protocol, review manually.      Passed - Valid encounter within last 12 months    Recent Outpatient Visits           6 months ago Sinusitis, unspecified chronicity, unspecified location   [facility] Simmons-Robinson, [place], MD   7 months ago Cough, unspecified type   [facility] [place], [place], PA-C   8 months ago Eyelid dermatitis, eczematous, left   [facility] Merita Norton T, FNP   8 months ago Allergic conjunctivitis of both eyes   [facility] Caro Laroche, DO   10 months ago Annual physical exam   [facility] Malva Limes, MD             Off-Protocol Failed - 10/07/2022  1:59 PM      Failed - Medication not assigned to a protocol, review manually.      Passed - Valid encounter within last 12 months    Recent Outpatient Visits           6 months ago Sinusitis, unspecified chronicity, unspecified location   [facility] Simmons-Robinson, [place], MD   7 months ago Cough, unspecified type   [facility] [place], [place], PA-C   8 months ago Eyelid dermatitis, eczematous, left   [facility] Merita Norton T, FNP   8 months ago Allergic conjunctivitis of both eyes   [facility] Caro Laroche, DO   10 months ago Annual physical exam   [facility] Malva Limes, MD               estradiol (ESTRACE) 0.5 MG tablet [Pharmacy Med Name: ESTRADIOL 0.5 MG TABLET] 90 tablet 0    Sig: TAKE 1 TABLET BY MOUTH  EVERY DAY     OB/GYN:  Estrogens Passed - 10/07/2022  1:59 PM      Passed - Mammogram is up-to-date per Health Maintenance      Passed - Last BP in normal range    BP Readings from Last 1 Encounters:  06/02/22 96/63         Passed - Valid encounter within last 12 months    Recent Outpatient Visits           6 months ago Sinusitis, unspecified chronicity, unspecified location   [facility] Simmons-Robinson, [place], MD   7 months ago Cough, unspecified type   [facility] [place], [place], PA-C   8 months ago Eyelid dermatitis, eczematous, left   [facility] Merita Norton T, FNP   8 months ago Allergic conjunctivitis of both eyes   [facility] Caro Laroche, DO   10 months ago Annual physical exam   [facility] Malva Limes, MD               montelukast (SINGULAIR) 10 MG tablet [Pharmacy Med Name: MONTELUKAST SOD 10 MG TABLET] 90 tablet 3    Sig: TAKE  1 TABLET BY MOUTH EVERYDAY AT BEDTIME     Pulmonology:  Leukotriene Inhibitors Passed - 10/07/2022  1:59 PM      Passed - Valid encounter within last 12 months    Recent Outpatient Visits           6 months ago Sinusitis, unspecified chronicity, unspecified location   [facility] Simmons-Robinson, [place], MD   7 months ago Cough, unspecified type   [facility] [place], [place], PA-C   8 months ago Eyelid dermatitis, eczematous, left   [facility] Merita Norton T, FNP   8 months ago Allergic conjunctivitis of both eyes   [facility] Caro Laroche, DO   10 months ago Annual physical exam   [facility] Malva Limes, MD

## 2022-10-08 NOTE — Telephone Encounter (Signed)
Requested medications are due for refill today.  yes  Requested medications are on the active medications list.  yes  Last refill. 05/14/2022 #90 1 rf  Future visit scheduled.   no  Notes to clinic.  Medication not assigned to a protocol. Please review for refill.    Requested Prescriptions  Pending Prescriptions Disp Refills   ipratropium (ATROVENT) 0.03 % nasal spray [Pharmacy Med Name: IPRATROPIUM 0.03% SPRAY]  1    Sig: PLACE 2 SPRAYS INTO THE NOSE DAILY AS NEEDED.     Off-Protocol Failed - 10/07/2022  1:59 PM      Failed - Medication not assigned to a protocol, review manually.      Passed - Valid encounter within last 12 months    Recent Outpatient Visits           6 months ago Sinusitis, unspecified chronicity, unspecified location   [facility] Simmons-Robinson, [place], MD   7 months ago Cough, unspecified type   [facility] [place], [place], PA-C   8 months ago Eyelid dermatitis, eczematous, left   [facility] Merita Norton T, FNP   8 months ago Allergic conjunctivitis of both eyes   [facility] Caro Laroche, DO   10 months ago Annual physical exam   [facility] Malva Limes, MD             Off-Protocol Failed - 10/07/2022  1:59 PM      Failed - Medication not assigned to a protocol, review manually.      Passed - Valid encounter within last 12 months    Recent Outpatient Visits           6 months ago Sinusitis, unspecified chronicity, unspecified location   [facility] Simmons-Robinson, [place], MD   7 months ago Cough, unspecified type   [facility] [place], [place], PA-C   8 months ago Eyelid dermatitis, eczematous, left   [facility] Merita Norton T, FNP   8 months ago Allergic conjunctivitis of both eyes   [facility] Caro Laroche, DO   10 months ago Annual physical exam   [facility] Malva Limes, MD              Signed Prescriptions Disp Refills   montelukast (SINGULAIR) 10 MG tablet 90 tablet 1    Sig: TAKE 1 TABLET BY MOUTH EVERYDAY AT BEDTIME     Pulmonology:  Leukotriene Inhibitors Passed - 10/07/2022  1:59 PM      Passed - Valid encounter within last 12 months    Recent Outpatient Visits           6 months ago Sinusitis, unspecified chronicity, unspecified location   [facility] Simmons-Robinson, [place], MD   7 months ago Cough, unspecified type   [facility] [place], [place], PA-C   8 months ago Eyelid dermatitis, eczematous, left   [facility] Merita Norton T, FNP   8 months ago Allergic conjunctivitis of both eyes   [facility] Caro Laroche, DO   10 months ago Annual physical exam   [facility] Malva Limes, MD              Refused Prescriptions Disp Refills   estradiol (ESTRACE) 0.5 MG  tablet [Pharmacy Med Name: ESTRADIOL 0.5 MG TABLET] 90 tablet 0    Sig: TAKE 1 TABLET BY MOUTH EVERY DAY     OB/GYN:  Estrogens Passed - 10/07/2022  1:59 PM      Passed - Mammogram is up-to-date per Health Maintenance      Passed - Last BP in normal range    BP Readings from Last 1 Encounters:  06/02/22 96/63         Passed - Valid encounter within last 12 months    Recent Outpatient Visits           6 months ago Sinusitis, unspecified chronicity, unspecified location   [facility] Simmons-Robinson, [place], MD   7 months ago Cough, unspecified type   [facility] [place], [place], PA-C   8 months ago Eyelid dermatitis, eczematous, left   [facility] Merita Norton T, FNP   8 months ago Allergic conjunctivitis of both eyes    [facility] Caro Laroche, DO   10 months ago Annual physical exam   [facility] Malva Limes, MD

## 2022-10-08 NOTE — Telephone Encounter (Signed)
Requested Prescriptions  Pending Prescriptions Disp Refills   ipratropium (ATROVENT) 0.03 % nasal spray [Pharmacy Med Name: IPRATROPIUM 0.03% SPRAY]  1    Sig: PLACE 2 SPRAYS INTO THE NOSE DAILY AS NEEDED.     Off-Protocol Failed - 10/07/2022  1:59 PM      Failed - Medication not assigned to a protocol, review manually.      Passed - Valid encounter within last 12 months    Recent Outpatient Visits           6 months ago Sinusitis, unspecified chronicity, unspecified location   [facility] Simmons-Robinson, [place], MD   7 months ago Cough, unspecified type   [facility] [place], [place], PA-C   8 months ago Eyelid dermatitis, eczematous, left   [facility] Merita Norton T, FNP   8 months ago Allergic conjunctivitis of both eyes   [facility] Caro Laroche, DO   10 months ago Annual physical exam   [facility] Malva Limes, MD             Off-Protocol Failed - 10/07/2022  1:59 PM      Failed - Medication not assigned to a protocol, review manually.      Passed - Valid encounter within last 12 months    Recent Outpatient Visits           6 months ago Sinusitis, unspecified chronicity, unspecified location   [facility] Simmons-Robinson, [place], MD   7 months ago Cough, unspecified type   [facility] [place], [place], PA-C   8 months ago Eyelid dermatitis, eczematous, left   [facility] Merita Norton T, FNP   8 months ago Allergic conjunctivitis of both eyes   [facility] Caro Laroche, DO   10 months ago Annual physical exam   [facility] Malva Limes, MD               montelukast (SINGULAIR) 10 MG tablet [Pharmacy Med Name: MONTELUKAST SOD 10 MG TABLET] 90 tablet 1    Sig: TAKE 1 TABLET BY  MOUTH EVERYDAY AT BEDTIME     Pulmonology:  Leukotriene Inhibitors Passed - 10/07/2022  1:59 PM      Passed - Valid encounter within last 12 months    Recent Outpatient Visits           6 months ago Sinusitis, unspecified chronicity, unspecified location   [facility] Simmons-Robinson, [place], MD   7 months ago Cough, unspecified type   [facility] [place], [place], PA-C   8 months ago Eyelid dermatitis, eczematous, left   [facility] Merita Norton T, FNP   8 months ago Allergic conjunctivitis of both eyes   [facility] Caro Laroche, DO   10 months ago Annual physical exam   [facility] Sherrie Mustache, Demetrios Isaacs, MD              Refused Prescriptions Disp Refills   estradiol (ESTRACE) 0.5 MG tablet [Pharmacy Med Name: ESTRADIOL 0.5 MG TABLET] 90 tablet 0    Sig: TAKE 1 TABLET BY MOUTH EVERY DAY     OB/GYN:  Estrogens Passed - 10/07/2022  1:59 PM      Passed - Mammogram is up-to-date per Health Maintenance  Passed - Last BP in normal range    BP Readings from Last 1 Encounters:  06/02/22 96/63         Passed - Valid encounter within last 12 months    Recent Outpatient Visits           6 months ago Sinusitis, unspecified chronicity, unspecified location   [facility] Simmons-Robinson, [place], MD   7 months ago Cough, unspecified type   [facility] [place], [place], PA-C   8 months ago Eyelid dermatitis, eczematous, left   [facility] Merita Norton T, FNP   8 months ago Allergic conjunctivitis of both eyes   [facility] Caro Lar[facility], DO   10 months ago Annual physical exam   [facility] Malva Limes, MD

## 2022-10-09 NOTE — Telephone Encounter (Signed)
Prescription was given to patient a year ago for migraines by Dr. Linwood Dibbles will need to be seen for the migraines and see If patient need a new prescription.

## 2022-10-19 ENCOUNTER — Other Ambulatory Visit: Payer: Self-pay | Admitting: Family Medicine

## 2022-10-19 DIAGNOSIS — J301 Allergic rhinitis due to pollen: Secondary | ICD-10-CM

## 2022-10-19 DIAGNOSIS — Z79890 Hormone replacement therapy (postmenopausal): Secondary | ICD-10-CM

## 2022-10-19 DIAGNOSIS — N951 Menopausal and female climacteric states: Secondary | ICD-10-CM

## 2022-10-19 NOTE — Telephone Encounter (Signed)
Medication Refill - Medication: estradiol (ESTRACE) 0.5 MG tablet [[id number]] cetirizine (ZYRTEC) 10 MG tablet [[id number]] Ipratropium-Albuterol (COMBIVENT RESPIMAT) 20-100 MCG/ACT AERS respimat [[id number]]   Has the patient contacted their pharmacy? Yes.     (Agent: If yes, when and what did the pharmacy advise?) Contact PCP   Preferred Pharmacy (with phone number or street name): [facility] [address] [place]   Has the patient been seen for an appointment in the last year OR does the patient have an upcoming appointment? Yes.    Agent: Please be advised that RX refills may take up to 3 business days. We ask that you follow-up with your pharmacy.

## 2022-10-20 MED ORDER — CETIRIZINE HCL 10 MG PO TABS: 10.0000 mg | ORAL_TABLET | Freq: Every day | ORAL | 0 refills | Status: DC

## 2022-10-20 MED ORDER — ESTRADIOL 0.5 MG PO TABS: 0.5000 mg | ORAL_TABLET | Freq: Every day | ORAL | 0 refills | Status: DC

## 2022-10-20 NOTE — Telephone Encounter (Signed)
Requested medication (s) are due for refill today: No  Requested medication (s) are on the active medication list: yes    Last refill: 10/10/22  #30  1 refill  Future visit scheduled No  Notes to clinic: Off protocol. Cannot refuse non-delegated meds per protocol.  Requested Prescriptions  Pending Prescriptions Disp Refills   ipratropium (ATROVENT) 0.03 % nasal spray 30 mL 1    Sig: Place 2 sprays into the nose daily as needed.     Off-Protocol Failed - 10/19/2022  3:58 PM      Failed - Medication not assigned to a protocol, review manually.      Passed - Valid encounter within last 12 months    Recent Outpatient Visits           7 months ago Sinusitis, unspecified chronicity, unspecified location   [facility] Simmons-Robinson, [place], MD   7 months ago Cough, unspecified type   [facility] [place], [place], PA-C   8 months ago Eyelid dermatitis, eczematous, left   [facility] Merita Norton T, FNP   9 months ago Allergic conjunctivitis of both eyes   [facility] Caro Lar[facility], DO   11 months ago Annual physical exam   [facility] Malva Limes, MD             Off-Protocol Failed - 10/19/2022  3:58 PM      Failed - Medication not assigned to a protocol, review manually.      Passed - Valid encounter within last 12 months    Recent Outpatient Visits           7 months ago Sinusitis, unspecified chronicity, unspecified location   [facility] Simmons-Robinson, [place], MD   7 months ago Cough, unspecified type   [facility] [place], [place], PA-C   8 months ago Eyelid dermatitis, eczematous, left   [facility] Merita Norton T, FNP   9 months ago Allergic conjunctivitis of both eyes   [facility] Caro Laroche, DO   11 months  ago Annual physical exam   [facility] Malva Limes, MD              Signed Prescriptions Disp Refills   estradiol (ESTRACE) 0.5 MG tablet 90 tablet 0    Sig: Take 1 tablet (0.5 mg total) by mouth daily.     OB/GYN:  Estrogens Passed - 10/19/2022  3:58 PM      Passed - Mammogram is up-to-date per Health Maintenance      Passed - Last BP in normal range    BP Readings from Last 1 Encounters:  06/02/22 96/63         Passed - Valid encounter within last 12 months    Recent Outpatient Visits           7 months ago Sinusitis, unspecified chronicity, unspecified location   [facility] Simmons-Robinson, [place], MD   7 months ago Cough, unspecified type   [facility] [place], [place], PA-C   8 months ago Eyelid dermatitis, eczematous, left   [facility] Merita Norton T, FNP   9 months ago Allergic conjunctivitis of both eyes   [facility] Caro Laroche, DO   11 months ago Annual physical exam  [facility] Malva Limes, MD               cetirizine (ZYRTEC) 10 MG tablet 90 tablet 0    Sig: Take 1 tablet (10 mg total) by mouth daily. For allergies     Ear, Nose, and Throat:  Antihistamines 2 Passed - 10/19/2022  3:58 PM      Passed - Cr in normal range and within 360 days    Creatinine  Date Value Ref Range Status  10/02/2013 0.85 0.60 - 1.30 mg/dL Final   Creatinine, Ser  Date Value Ref Range Status  11/06/2021 0.71 0.57 - 1.00 mg/dL Final         Passed - Valid encounter within last 12 months    Recent Outpatient Visits           7 months ago Sinusitis, unspecified chronicity, unspecified location   [facility] Simmons-Robinson, [place], MD   7 months ago Cough, unspecified type   [facility] [place], [place], PA-C   8 months ago Eyelid  dermatitis, eczematous, left   [facility] Merita Norton T, FNP   9 months ago Allergic conjunctivitis of both eyes   [facility] Caro Laroche, DO   11 months ago Annual physical exam   [facility] Sherrie Mustache, Demetrios Isaacs, MD

## 2022-10-20 NOTE — Telephone Encounter (Signed)
Requested Prescriptions  Pending Prescriptions Disp Refills   estradiol (ESTRACE) 0.5 MG tablet 90 tablet 0    Sig: Take 1 tablet (0.5 mg total) by mouth daily.     OB/GYN:  Estrogens Passed - 10/19/2022  3:58 PM      Passed - Mammogram is up-to-date per Health Maintenance      Passed - Last BP in normal range    BP Readings from Last 1 Encounters:  06/02/22 96/63         Passed - Valid encounter within last 12 months    Recent Outpatient Visits           7 months ago Sinusitis, unspecified chronicity, unspecified location   [facility] Simmons-Robinson, [place], MD   7 months ago Cough, unspecified type   [facility] [place], [place], PA-C   8 months ago Eyelid dermatitis, eczematous, left   [facility] Merita Norton T, FNP   9 months ago Allergic conjunctivitis of both eyes   [facility] Caro Laroche, DO   11 months ago Annual physical exam   [facility] Malva Limes, MD               cetirizine (ZYRTEC) 10 MG tablet 90 tablet 0    Sig: Take 1 tablet (10 mg total) by mouth daily. For allergies     Ear, Nose, and Throat:  Antihistamines 2 Passed - 10/19/2022  3:58 PM      Passed - Cr in normal range and within 360 days    Creatinine  Date Value Ref Range Status  10/02/2013 0.85 0.60 - 1.30 mg/dL Final   Creatinine, Ser  Date Value Ref Range Status  11/06/2021 0.71 0.57 - 1.00 mg/dL Final         Passed - Valid encounter within last 12 months    Recent Outpatient Visits           7 months ago Sinusitis, unspecified chronicity, unspecified location   [facility] Simmons-Robinson, [place], MD   7 months ago Cough, unspecified type   [facility] [place], [place], PA-C   8 months ago Eyelid dermatitis, eczematous, left   [facility] Merita Norton T,  FNP   9 months ago Allergic conjunctivitis of both eyes   [facility] Caro Laroche, DO   11 months ago Annual physical exam   [facility] Malva Limes, MD               ipratropium (ATROVENT) 0.03 % nasal spray 30 mL 1    Sig: Place 2 sprays into the nose daily as needed.     Off-Protocol Failed - 10/19/2022  3:58 PM      Failed - Medication not assigned to a protocol, review manually.      Passed - Valid encounter within last 12 months    Recent Outpatient Visits           7 months ago Sinusitis, unspecified chronicity, unspecified location   [facility] Simmons-Robinson, [place], MD   7 months ago Cough, unspecified type   [facility] [place], [place], PA-C   8 months ago Eyelid dermatitis, eczematous, left   [facility] Merita Norton T, FNP   9 months ago Allergic conjunctivitis of both eyes  [facility] Caro Lar[facility], DO   11 months ago Annual physical exam   [facility] Malva [facility], MD             Off-Protocol Failed - 10/19/2022  3:58 PM      Failed - Medication not assigned to a protocol, review manually.      Passed - Valid encounter within last 12 months    Recent Outpatient Visits           7 months ago Sinusitis, unspecified chronicity, unspecified location   [facility] Simmons-Robinson, [place], MD   7 months ago Cough, unspecified type   [facility] [place], [place], PA-C   8 months ago Eyelid dermatitis, eczematous, left   [facility] Jacky Kindle, FNP   9 months ago Allergic conjunctivitis of both eyes   [facility] Caro Laroche, DO   11 months ago Annual physical exam   [facility] Malva Limes, MD

## 2022-10-22 ENCOUNTER — Ambulatory Visit
Admission: EM | Admit: 2022-10-22 | Discharge: 2022-10-22 | Disposition: A | Discharge status: Home / Self Care | Payer: Medicaid Other | Attending: Emergency Medicine | Admitting: Emergency Medicine

## 2022-10-22 DIAGNOSIS — Z8616 Personal history of COVID-19: Secondary | ICD-10-CM | POA: Diagnosis not present

## 2022-10-22 DIAGNOSIS — J0100 Acute maxillary sinusitis, unspecified: Secondary | ICD-10-CM | POA: Insufficient documentation

## 2022-10-22 DIAGNOSIS — N898 Other specified noninflammatory disorders of vagina: Secondary | ICD-10-CM | POA: Insufficient documentation

## 2022-10-22 LAB — URINALYSIS, W/ REFLEX TO CULTURE (INFECTION SUSPECTED)
Bilirubin Urine: NEGATIVE
Glucose, UA: NEGATIVE mg/dL
Hgb urine dipstick: NEGATIVE
Ketones, ur: NEGATIVE mg/dL
Leukocytes,Ua: NEGATIVE
Nitrite: NEGATIVE
Protein, ur: NEGATIVE mg/dL
Specific Gravity, Urine: 1.01 (ref 1.005–1.030)
pH: 6 (ref 5.0–8.0)

## 2022-10-22 MED ORDER — AMOXICILLIN-POT CLAVULANATE 875-125 MG PO TABS: 1.0000 | ORAL_TABLET | Freq: Two times a day (BID) | ORAL | 0 refills | Status: AC

## 2022-10-22 NOTE — ED Triage Notes (Signed)
Pt c/o urinary freq,sinus pressure & sore throat x7 days. Denies any hematuria. Has tried sudafed,tylenol & nyquil w/o relief.

## 2022-10-22 NOTE — Discharge Instructions (Signed)
Begin Augmentin every morning and every evening for 10 days for treatment of sinus infection  May continue use of over-the-counter medications, may also attempt any of the following below  To help build your immunity you may attempt to take vitamin supplements, some people do find them helpful, common supplements for immunity include vitamin C,  Ashwagandha, Echinacea, tumeric, zinc     You can take Tylenol and/or Ibuprofen as needed for fever reduction and pain relief.   For cough: honey 1/2 to 1 teaspoon (you can dilute the honey in water or another fluid).  You can also use guaifenesin and dextromethorphan for cough. You can use a humidifier for chest congestion and cough.  If you don't have a humidifier, you can sit in the bathroom with the hot shower running.      For sore throat: try warm salt water gargles, cepacol lozenges, throat spray, warm tea or water with lemon/honey, popsicles or ice, or OTC cold relief medicine for throat discomfort.   For congestion: take a daily anti-histamine like Zyrtec, Claritin, and a oral decongestant, such as pseudoephedrine.  You can also use Flonase 1-2 sprays in each nostril daily.   It is important to stay hydrated: drink plenty of fluids (water, gatorade/powerade/pedialyte, juices, or teas) to keep your throat moisturized and help further relieve irritation/discomfort.
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
## 2022-10-23 LAB — URINE CULTURE: Culture: NO GROWTH

## 2022-10-26 ENCOUNTER — Telehealth: Payer: Self-pay | Admitting: Family Medicine

## 2022-10-26 NOTE — Telephone Encounter (Signed)
Covermymeds is requesting prior authorization Key:[id number] Name: Tyana Starmer 50mg  tablets

## 2022-10-27 NOTE — Telephone Encounter (Signed)
Dr. Sherrie Mustache, Prescription was given to patient a year ago for migraines by Dr. Linwood Dibbles will need to be seen for the migraines and see If patient need a new prescription in order to do PA.

## 2022-11-05 ENCOUNTER — Other Ambulatory Visit: Payer: Self-pay | Admitting: Family Medicine

## 2022-11-05 DIAGNOSIS — H01136 Eczematous dermatitis of left eye, unspecified eyelid: Secondary | ICD-10-CM

## 2022-11-05 DIAGNOSIS — G43809 Other migraine, not intractable, without status migrainosus: Secondary | ICD-10-CM

## 2022-11-06 ENCOUNTER — Other Ambulatory Visit: Payer: Self-pay | Admitting: Physician Assistant

## 2022-11-06 ENCOUNTER — Other Ambulatory Visit: Payer: Self-pay | Admitting: Family Medicine

## 2022-11-06 DIAGNOSIS — R059 Cough, unspecified: Secondary | ICD-10-CM

## 2022-11-06 DIAGNOSIS — J349 Unspecified disorder of nose and nasal sinuses: Secondary | ICD-10-CM

## 2022-11-06 DIAGNOSIS — N951 Menopausal and female climacteric states: Secondary | ICD-10-CM

## 2022-11-06 DIAGNOSIS — J4540 Moderate persistent asthma, uncomplicated: Secondary | ICD-10-CM

## 2022-11-06 DIAGNOSIS — Z79890 Hormone replacement therapy (postmenopausal): Secondary | ICD-10-CM

## 2022-11-06 DIAGNOSIS — J302 Other seasonal allergic rhinitis: Secondary | ICD-10-CM

## 2022-11-06 NOTE — Telephone Encounter (Signed)
Requested Prescriptions  Pending Prescriptions Disp Refills   Ipratropium-Albuterol (COMBIVENT RESPIMAT) 20-100 MCG/ACT AERS respimat [Pharmacy Med Name: COMBIVENT RESPIMAT 20-100 MCG] 4 g 1    Sig: INHALE 1 PUFF INTO THE LUNGS EVERY 4 HOURS AS NEEDED FOR WHEEZING.     Pulmonology:  Combination Products - albuterol / ipratropium Passed - 11/06/2022  2:51 PM      Passed - Last BP in normal range    BP Readings from Last 1 Encounters:  10/22/22 118/73         Passed - Last Heart Rate in normal range    Pulse Readings from Last 1 Encounters:  10/22/22 (!) 53         Passed - Valid encounter within last 12 months    Recent Outpatient Visits           7 months ago Sinusitis, unspecified chronicity, unspecified location   [facility] Simmons-Robinson, [place], MD   8 months ago Cough, unspecified type   [facility] [place], [place], PA-C   9 months ago Eyelid dermatitis, eczematous, left   [facility] Merita Norton T, FNP   9 months ago Allergic conjunctivitis of both eyes   [facility] Caro Laroche, DO   11 months ago Annual physical exam   [facility] Malva Limes, MD

## 2022-11-06 NOTE — Telephone Encounter (Signed)
Requested medication (s) are due for refill today: yes  Requested medication (s) are on the active medication list: yes  Last refill:  06/23/22  Future visit scheduled: no  Notes to clinic:  Unable to refill per protocol, cannot delegate.      Requested Prescriptions  Pending Prescriptions Disp Refills   nortriptyline (PAMELOR) 25 MG capsule [Pharmacy Med Name: NORTRIPTYLINE HCL 25 MG CAP] 90 capsule 3    Sig: TAKE ONE CAPSULE AT BEDTIME FOR MIGRANE PREVENTION AND IRRITABLE BOWEL SYNDROME     Psychiatry:  Antidepressants - Heterocyclics (TCAs) Failed - 11/05/2022  5:59 PM      Failed - Valid encounter within last 6 months    Recent Outpatient Visits           7 months ago Sinusitis, unspecified chronicity, unspecified location   [facility] Simmons-Robinson, [place], MD   8 months ago Cough, unspecified type   [facility] [place], [place], PA-C   9 months ago Eyelid dermatitis, eczematous, left   [facility] Merita Norton T, FNP   9 months ago Allergic conjunctivitis of both eyes   [facility] Caro Laroche, DO   11 months ago Annual physical exam   [facility] Malva Limes, MD              Passed - Completed PHQ-2 or PHQ-9 in the last 360 days       triamcinolone ointment (KENALOG) 0.5 % [Pharmacy Med Name: TRIAMCINOLONE 0.5% OINTMENT] 30 g 1    Sig: APPLY TO AFFECTED AREA TWICE A DAY     Not Delegated - Dermatology:  Corticosteroids Failed - 11/05/2022  5:59 PM      Failed - This refill cannot be delegated      Passed - Valid encounter within last 12 months    Recent Outpatient Visits           7 months ago Sinusitis, unspecified chronicity, unspecified location   [facility] Simmons-Robinson, [place], MD   8 months ago Cough, unspecified type   [facility] [place],  [place], PA-C   9 months ago Eyelid dermatitis, eczematous, left   [facility] Merita Norton T, FNP   9 months ago Allergic conjunctivitis of both eyes   [facility] Caro Laroche, DO   11 months ago Annual physical exam   [facility] Sherrie Mustache, Demetrios Isaacs, MD

## 2022-11-09 ENCOUNTER — Telehealth: Payer: Self-pay | Admitting: Family Medicine

## 2022-11-09 NOTE — Telephone Encounter (Signed)
Covermymeds is requesting prior authorization Key: [facility] Name: Erin Richards 50 MG tablets

## 2022-11-11 NOTE — Telephone Encounter (Signed)
PA done. Note: Patient has not been evaluated for almost a year for migraine after starting Ubrelvy. Not sure if medication is working. If Pa is denied and patient ask for appeal will need appointment for documentation.

## 2022-11-11 NOTE — Telephone Encounter (Signed)
Outcome Approved today by [facility] [employer] [place] Case: [id number], Status: Approved, Coverage Starts on: 11/11/2022 12:00:00 AM, Coverage Ends on: 11/11/2023 12:00:00 AM. Authorization Expiration Date: 11/10/2023 Drug Bernita Raisin 50MG  tablets

## 2022-11-25 ENCOUNTER — Other Ambulatory Visit: Payer: Self-pay | Admitting: Family Medicine

## 2022-11-25 DIAGNOSIS — R059 Cough, unspecified: Secondary | ICD-10-CM

## 2022-11-25 DIAGNOSIS — J349 Unspecified disorder of nose and nasal sinuses: Secondary | ICD-10-CM

## 2022-11-25 DIAGNOSIS — R0602 Shortness of breath: Secondary | ICD-10-CM

## 2022-11-25 DIAGNOSIS — J4540 Moderate persistent asthma, uncomplicated: Secondary | ICD-10-CM

## 2022-11-25 NOTE — Telephone Encounter (Signed)
Medication Refill - Medication: VENTOLIN HFA 108 (90 Base) MCG/ACT inhaler and Ipratropium-Albuterol (COMBIVENT RESPIMAT) 20-100 MCG/ACT AERS respimat   Has the patient contacted their pharmacy? No.  Preferred Pharmacy (with phone number or street name): [id number] - [place], [place] - [address]  Phone: [phone] Fax: [phone]  Has the patient been seen for an appointment in the last year OR does the patient have an upcoming appointment? Yes.    Agent: Please be advised that RX refills may take up to 3 business days. We ask that you follow-up with your pharmacy.

## 2022-11-26 MED ORDER — COMBIVENT RESPIMAT 20-100 MCG/ACT IN AERS: INHALATION_SPRAY | RESPIRATORY_TRACT | 0 refills | Status: DC

## 2022-11-26 NOTE — Telephone Encounter (Signed)
Requested medication (s) are due for refill today: Yes  Requested medication (s) are on the active medication list: No  Last refill:  2 years ago  Future visit scheduled: Yes  Notes to clinic:  Inhaler discontinued 03/03/22     Requested Prescriptions  Pending Prescriptions Disp Refills   VENTOLIN HFA 108 (90 Base) MCG/ACT inhaler 18 g 5    Sig: Inhale 2 puffs into the lungs every 6 (six) hours as needed for wheezing or shortness of breath.     Pulmonology:  Beta Agonists 2 Passed - 11/25/2022  8:56 AM      Passed - Last BP in normal range    BP Readings from Last 1 Encounters:  10/22/22 118/73         Passed - Last Heart Rate in normal range    Pulse Readings from Last 1 Encounters:  10/22/22 (!) 53         Passed - Valid encounter within last 12 months    Recent Outpatient Visits           8 months ago Sinusitis, unspecified chronicity, unspecified location   [facility] Simmons-Robinson, [place], MD   8 months ago Cough, unspecified type   [facility] [place], [place], PA-C   10 months ago Eyelid dermatitis, eczematous, left   [facility] Merita Norton T, FNP   10 months ago Allergic conjunctivitis of both eyes   [facility] Caro Laroche, DO   1 year ago Annual physical exam   [facility] Malva Limes, MD              Signed Prescriptions Disp Refills   Ipratropium-Albuterol (COMBIVENT RESPIMAT) 20-100 MCG/ACT AERS respimat 12 g 0    Sig: INHALE 1 PUFF INTO THE LUNGS EVERY 4 HOURS AS NEEDED FOR WHEEZING.     Pulmonology:  Combination Products - albuterol / ipratropium Passed - 11/25/2022  8:56 AM      Passed - Last BP in normal range    BP Readings from Last 1 Encounters:  10/22/22 118/73         Passed - Last Heart Rate in normal range    Pulse Readings from Last 1 Encounters:  10/22/22 (!) 53         Passed -  Valid encounter within last 12 months    Recent Outpatient Visits           8 months ago Sinusitis, unspecified chronicity, unspecified location   [facility] Simmons-Robinson, [place], MD   8 months ago Cough, unspecified type   [facility] [place], [place], PA-C   10 months ago Eyelid dermatitis, eczematous, left   [facility] Merita Norton T, FNP   10 months ago Allergic conjunctivitis of both eyes   [facility] Caro Laroche, DO   1 year ago Annual physical exam   [facility] Malva Limes, MD

## 2022-11-26 NOTE — Telephone Encounter (Signed)
Requested Prescriptions  Pending Prescriptions Disp Refills   Ipratropium-Albuterol (COMBIVENT RESPIMAT) 20-100 MCG/ACT AERS respimat 12 g 0    Sig: INHALE 1 PUFF INTO THE LUNGS EVERY 4 HOURS AS NEEDED FOR WHEEZING.     Pulmonology:  Combination Products - albuterol / ipratropium Passed - 11/25/2022  8:56 AM      Passed - Last BP in normal range    BP Readings from Last 1 Encounters:  10/22/22 118/73         Passed - Last Heart Rate in normal range    Pulse Readings from Last 1 Encounters:  10/22/22 (!) 53         Passed - Valid encounter within last 12 months    Recent Outpatient Visits           8 months ago Sinusitis, unspecified chronicity, unspecified location   [facility] Simmons-Robinson, [place], MD   8 months ago Cough, unspecified type   [facility] [place], [place], PA-C   10 months ago Eyelid dermatitis, eczematous, left   [facility] Merita Norton T, FNP   10 months ago Allergic conjunctivitis of both eyes   [facility] Caro Laroche, DO   1 year ago Annual physical exam   [facility] Malva Limes, MD               VENTOLIN HFA 108 (90 Base) MCG/ACT inhaler 18 g 5    Sig: Inhale 2 puffs into the lungs every 6 (six) hours as needed for wheezing or shortness of breath.     Pulmonology:  Beta Agonists 2 Passed - 11/25/2022  8:56 AM      Passed - Last BP in normal range    BP Readings from Last 1 Encounters:  10/22/22 118/73         Passed - Last Heart Rate in normal range    Pulse Readings from Last 1 Encounters:  10/22/22 (!) 53         Passed - Valid encounter within last 12 months    Recent Outpatient Visits           8 months ago Sinusitis, unspecified chronicity, unspecified location   [facility] [place]Rob[place], [place], [place]   8 months ago Cough, unspecified type   [facility] [place], [place], PA-C   10 months ago Eyelid dermatitis, eczematous, left   [facility] Merita Norton T, FNP   10 months ago Allergic conjunctivitis of both eyes   [facility] Caro Laroche, DO   1 year ago Annual physical exam   [facility] Malva Limes, MD

## 2022-12-12 ENCOUNTER — Other Ambulatory Visit: Payer: Self-pay | Admitting: Family Medicine

## 2022-12-12 DIAGNOSIS — J301 Allergic rhinitis due to pollen: Secondary | ICD-10-CM

## 2022-12-14 NOTE — Telephone Encounter (Signed)
Requested medications are due for refill today.  yes  Requested medications are on the active medications list.  yes  Last refill. 10/10/2022 30mL 1 rf  Future visit scheduled.   no  Notes to clinic.  Medication not assigned to a protocol. Please review for refill.    Requested Prescriptions  Pending Prescriptions Disp Refills   ipratropium (ATROVENT) 0.03 % nasal spray [Pharmacy Med Name: IPRATROPIUM 0.03% SPRAY]  1    Sig: PLACE 2 SPRAYS INTO THE NOSE DAILY AS NEEDED.     Off-Protocol Failed - 12/12/2022 12:15 PM      Failed - Medication not assigned to a protocol, review manually.      Passed - Valid encounter within last 12 months    Recent Outpatient Visits           8 months ago Sinusitis, unspecified chronicity, unspecified location   [facility] Simmons-Robinson, [place], MD   9 months ago Cough, unspecified type   [facility] [place], [place], PA-C   10 months ago Eyelid dermatitis, eczematous, left   [facility] Merita Norton T, FNP   10 months ago Allergic conjunctivitis of both eyes   [facility] Caro Laroche, DO   1 year ago Annual physical exam   [facility] [place] Malva Limes, MD             Off-Protocol Failed - 12/12/2022 12:15 PM      Failed - Medication not assigned to a protocol, review manually.      Passed - Valid encounter within last 12 months    Recent Outpatient Visits           8 months ago Sinusitis, unspecified chronicity, unspecified location   [facility] Simmons-Robinson, [place], MD   9 months ago Cough, unspecified type   [facility] [place], [place], PA-C   10 months ago Eyelid dermatitis, eczematous, left   [facility] Jacky Kindle, FNP   10 months ago Allergic conjunctivitis of both eyes   [facility] Caro Laroche, DO   1 year ago Annual physical exam   [facility] Malva Limes, MD

## 2023-01-14 ENCOUNTER — Other Ambulatory Visit: Payer: Self-pay | Admitting: Family Medicine

## 2023-01-14 DIAGNOSIS — Z1231 Encounter for screening mammogram for malignant neoplasm of breast: Secondary | ICD-10-CM

## 2023-01-20 ENCOUNTER — Ambulatory Visit (INDEPENDENT_AMBULATORY_CARE_PROVIDER_SITE_OTHER): Payer: Medicaid Other

## 2023-01-20 ENCOUNTER — Encounter: Payer: Self-pay | Admitting: Emergency Medicine

## 2023-01-20 ENCOUNTER — Ambulatory Visit
Admission: EM | Admit: 2023-01-20 | Discharge: 2023-01-20 | Disposition: A | Discharge status: Home / Self Care | Payer: Medicaid Other | Attending: Family Medicine | Admitting: Family Medicine

## 2023-01-20 ENCOUNTER — Ambulatory Visit: Payer: Medicaid Other

## 2023-01-20 DIAGNOSIS — M79602 Pain in left arm: Secondary | ICD-10-CM | POA: Diagnosis not present

## 2023-01-20 MED ORDER — PREDNISONE 10 MG (21) PO TBPK: ORAL_TABLET | Freq: Every day | ORAL | 0 refills | Status: DC

## 2023-01-20 MED ORDER — IBUPROFEN 800 MG PO TABS
800.0000 mg | ORAL_TABLET | Freq: Once | ORAL | Status: AC
  Administered 2023-01-20: 800 mg via ORAL

## 2023-01-20 MED ORDER — CYCLOBENZAPRINE HCL 5 MG PO TABS: 5.0000 mg | ORAL_TABLET | Freq: Three times a day (TID) | ORAL | 0 refills | Status: DC | PRN

## 2023-01-20 MED ORDER — IBUPROFEN 600 MG PO TABS
600.0000 mg | ORAL_TABLET | Freq: Four times a day (QID) | ORAL | 0 refills | Status: DC | PRN
Start: 2023-01-20 — End: 2023-02-17
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
## 2023-01-20 NOTE — Discharge Instructions (Addendum)
Your xrays are normal.  If medication was prescribed, stop by the pharmacy to pick up your prescriptions.  For your  pain, Take 1500 mg Tylenol twice a day, take muscle relaxer (Flexeril) and ibuprofen  as needed for pain. Wear your wrist brace.  Rest and elevate the affected painful area.  Apply warm compresses intermittently, as needed.  As pain recedes, begin normal activities slowly as tolerated.  Follow up with primary care provider or an orthopedic provider, if symptoms persist.  Watch for worsening symptoms such as an increasing weakness or loss of sensation, increasing pain and/or the loss of bladder or bowel function. Should any of these occur, go to the emergency department immediately.

## 2023-01-20 NOTE — ED Notes (Signed)
No edema, bilateral radial pulses 2 +

## 2023-01-20 NOTE — ED Triage Notes (Signed)
Reports shooting pains from elbow to hand.  Also described as throbbing.  Patient is right handed.  Symptoms started today prior to leaving work.   Patient has had tylenol today.  No known injury.    Patient reports she has been picking up milk crates-patient works in [employer]

## 2023-01-29 ENCOUNTER — Encounter: Payer: Self-pay | Admitting: Gastroenterology

## 2023-02-02 ENCOUNTER — Encounter: Payer: Self-pay | Admitting: Gastroenterology

## 2023-02-04 ENCOUNTER — Ambulatory Visit
Admission: RE | Admit: 2023-02-04 | Discharge: 2023-02-04 | Disposition: A | Discharge status: Home / Self Care | Payer: Medicaid Other | Source: Ambulatory Visit | Attending: Family Medicine | Admitting: Family Medicine

## 2023-02-04 DIAGNOSIS — Z1231 Encounter for screening mammogram for malignant neoplasm of breast: Secondary | ICD-10-CM | POA: Diagnosis not present

## 2023-02-17 ENCOUNTER — Ambulatory Visit
Admission: EM | Admit: 2023-02-17 | Discharge: 2023-02-17 | Disposition: A | Discharge status: Home / Self Care | Payer: Medicaid Other | Attending: Family Medicine | Admitting: Family Medicine

## 2023-02-17 ENCOUNTER — Ambulatory Visit (INDEPENDENT_AMBULATORY_CARE_PROVIDER_SITE_OTHER): Payer: Medicaid Other

## 2023-02-17 DIAGNOSIS — M25552 Pain in left hip: Secondary | ICD-10-CM

## 2023-02-17 MED ORDER — PREDNISONE 10 MG (21) PO TBPK: ORAL_TABLET | Freq: Every day | ORAL | 0 refills | Status: DC

## 2023-02-17 MED ORDER — IBUPROFEN 600 MG PO TABS: 600.0000 mg | ORAL_TABLET | Freq: Four times a day (QID) | ORAL | 0 refills | Status: DC | PRN

## 2023-02-17 MED ORDER — METHOCARBAMOL 500 MG PO TABS: 500.0000 mg | ORAL_TABLET | Freq: Three times a day (TID) | ORAL | 0 refills | Status: DC | PRN

## 2023-02-17 MED ORDER — IBUPROFEN 800 MG PO TABS
800.0000 mg | ORAL_TABLET | Freq: Once | ORAL | Status: AC
  Administered 2023-02-17: 800 mg via ORAL

## 2023-02-17 MED ORDER — ACETAMINOPHEN 325 MG PO TABS
975.0000 mg | ORAL_TABLET | Freq: Once | ORAL | Status: AC
Start: 2023-02-17 — End: 2023-02-17
  Administered 2023-02-17: 975 mg via ORAL

## 2023-02-17 NOTE — Discharge Instructions (Addendum)
 You have some age-related changes but no broken bones or dislocated bones on your x-ray.  If the radiologist finds something that I did not, I will call you otherwise you will see your complete results in MyChart.  Stop by the pharmacy to pick up your muscle relaxer and anti-inflammatory pain medicine.

## 2023-02-17 NOTE — ED Triage Notes (Signed)
 Pt c/o L hip pain since today. States pain radiates down leg. Denies any falls or injuries.
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
## 2023-02-19 ENCOUNTER — Encounter: Payer: Self-pay | Admitting: Gastroenterology

## 2023-02-19 ENCOUNTER — Encounter
Admission: RE | Disposition: A | Discharge status: Home / Self Care | Payer: Self-pay | Source: Home / Self Care | Attending: Gastroenterology

## 2023-02-19 ENCOUNTER — Ambulatory Visit: Payer: Medicaid Other | Admitting: Anesthesiology

## 2023-02-19 ENCOUNTER — Ambulatory Visit
Admission: RE | Admit: 2023-02-19 | Discharge: 2023-02-19 | Disposition: A | Discharge status: Home / Self Care | Payer: Medicaid Other | Attending: Gastroenterology | Admitting: Gastroenterology

## 2023-02-19 DIAGNOSIS — Z1211 Encounter for screening for malignant neoplasm of colon: Secondary | ICD-10-CM | POA: Diagnosis not present

## 2023-02-19 DIAGNOSIS — K640 First degree hemorrhoids: Secondary | ICD-10-CM | POA: Insufficient documentation

## 2023-02-19 DIAGNOSIS — F32A Depression, unspecified: Secondary | ICD-10-CM | POA: Insufficient documentation

## 2023-02-19 DIAGNOSIS — Z8673 Personal history of transient ischemic attack (TIA), and cerebral infarction without residual deficits: Secondary | ICD-10-CM | POA: Diagnosis not present

## 2023-02-19 DIAGNOSIS — I499 Cardiac arrhythmia, unspecified: Secondary | ICD-10-CM | POA: Diagnosis not present

## 2023-02-19 DIAGNOSIS — Z79899 Other long term (current) drug therapy: Secondary | ICD-10-CM | POA: Diagnosis not present

## 2023-02-19 DIAGNOSIS — Z8719 Personal history of other diseases of the digestive system: Secondary | ICD-10-CM | POA: Diagnosis not present

## 2023-02-19 DIAGNOSIS — K649 Unspecified hemorrhoids: Secondary | ICD-10-CM | POA: Diagnosis not present

## 2023-02-19 DIAGNOSIS — K219 Gastro-esophageal reflux disease without esophagitis: Secondary | ICD-10-CM | POA: Insufficient documentation

## 2023-02-19 DIAGNOSIS — K5730 Diverticulosis of large intestine without perforation or abscess without bleeding: Secondary | ICD-10-CM | POA: Diagnosis not present

## 2023-02-19 HISTORY — PX: COLONOSCOPY WITH PROPOFOL: SHX5780

## 2023-02-19 SURGERY — COLONOSCOPY WITH PROPOFOL
Anesthesia: General

## 2023-02-19 MED ORDER — PROPOFOL 500 MG/50ML IV EMUL
INTRAVENOUS | Status: DC | PRN
  Administered 2023-02-19: 145 ug/kg/min via INTRAVENOUS

## 2023-02-19 MED ORDER — EPHEDRINE SULFATE-NACL 50-0.9 MG/10ML-% IV SOSY
PREFILLED_SYRINGE | INTRAVENOUS | Status: DC | PRN
  Administered 2023-02-19 (×3): 5 mg via INTRAVENOUS

## 2023-02-19 MED ORDER — LIDOCAINE HCL (CARDIAC) PF 100 MG/5ML IV SOSY
PREFILLED_SYRINGE | INTRAVENOUS | Status: DC | PRN
  Administered 2023-02-19: 50 mg via INTRAVENOUS

## 2023-02-19 MED ORDER — SODIUM CHLORIDE 0.9 % IV SOLN
INTRAVENOUS | Status: DC
  Administered 2023-02-19: 20 mL/h via INTRAVENOUS

## 2023-02-19 MED ORDER — PROPOFOL 10 MG/ML IV BOLUS
INTRAVENOUS | Status: DC | PRN
  Administered 2023-02-19: 40 mg via INTRAVENOUS

## 2023-02-19 NOTE — Interval H&P Note (Signed)
History and Physical Interval Note: Preprocedure H&P from 02/19/23  was reviewed and there was no interval change after seeing and examining the patient.  Written consent was obtained from the patient after discussion of risks, benefits, and alternatives. Patient has consented to proceed with Colonoscopy with possible intervention   02/19/2023 10:30 AM  Erin Richards  has presented today for surgery, with the diagnosis of V12.79 (ICD-9-CM) - Z87.19 (ICD-10-CM) - History of colitis.  The various methods of treatment have been discussed with the patient and family. After consideration of risks, benefits and other options for treatment, the patient has consented to  Procedure(s): COLONOSCOPY WITH PROPOFOL (N/A) as a surgical intervention.  The patient's history has been reviewed, patient examined, no change in status, stable for surgery.  I have reviewed the patient's chart and labs.  Questions were answered to the patient's satisfaction.     Jaynie Collins

## 2023-02-19 NOTE — Anesthesia Procedure Notes (Signed)
Procedure Name: General with mask airway Date/Time: 02/19/2023 10:37 AM  Performed by: Mohammed Kindle, CRNAPre-anesthesia Checklist: Patient identified, Emergency Drugs available, Suction available and Patient being monitored Patient Re-evaluated:Patient Re-evaluated prior to induction Oxygen Delivery Method: Simple face mask Induction Type: IV induction Placement Confirmation: positive ETCO2 and breath sounds checked- equal and bilateral Dental Injury: Teeth and Oropharynx as per pre-operative assessment
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
## 2023-02-19 NOTE — Transfer of Care (Signed)
Immediate Anesthesia Transfer of Care Note  Patient: Erin Richards  Procedure(s) Performed: COLONOSCOPY WITH PROPOFOL  Patient Location: Endoscopy Unit  Anesthesia Type:General  Level of Consciousness: drowsy and patient cooperative  Airway & Oxygen Therapy: Patient Spontanous Breathing and Patient connected to face mask oxygen  Post-op Assessment: Report given to RN and Post -op Vital signs reviewed and stable  Post vital signs: Reviewed and stable  Last Vitals:  Vitals Value Taken Time  BP 117/61 02/19/23 1106  Temp    Pulse 73 02/19/23 1108  Resp 18 02/19/23 1108  SpO2 100 % 02/19/23 1108  Vitals shown include unfiled device data.  Last Pain:  Vitals:   02/19/23 1106  TempSrc:   PainSc: 0-No pain         Complications: No notable events documented.
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
## 2023-02-19 NOTE — Anesthesia Postprocedure Evaluation (Signed)
Anesthesia Post Note  Patient: Erin Richards  Procedure(s) Performed: COLONOSCOPY WITH PROPOFOL  Patient location during evaluation: PACU Anesthesia Type: General Level of consciousness: awake and awake and alert Pain management: satisfactory to patient Vital Signs Assessment: post-procedure vital signs reviewed and stable Respiratory status: spontaneous breathing Cardiovascular status: stable Anesthetic complications: no   No notable events documented.   Last Vitals:  Vitals:   02/19/23 0932 02/19/23 1106  BP: 108/60 117/61  Pulse: 70 79  Resp: 20 (!) 22  Temp: (!) 36 C   SpO2: 100% 100%    Last Pain:  Vitals:   02/19/23 1106  TempSrc:   PainSc: Asleep                 VAN STAVEREN,Siddhant Hashemi

## 2023-02-19 NOTE — Op Note (Signed)
[facility] Patient Name: Erin Richards Procedure Date: 02/19/2023 10:24 AM MRN: [id number] Account #: [id number] Date of Birth: 09/06/1965 Admit Type: Outpatient Age: 58 Room: [facility] ENDO ROOM 1 Gender: Female Note Status: Supervisor Override Instrument Name: Prentice Docker [id number] Procedure:             Colonoscopy Indications:           High risk colon cancer surveillance: Ulcerative                         pancolitis of 8 (or more) years duration Providers:             Trenda Moots, DO Referring MD:          Demetrios Isaacs. Sherrie Mustache, MD (Referring MD) Medicines:             Monitored Anesthesia Care Complications:         No immediate complications. Estimated blood loss: None. Procedure:             Pre-Anesthesia Assessment:                        - Prior to the procedure, a History and Physical was                         performed, and patient medications and allergies were                         reviewed. The patient is competent. The risks and                         benefits of the procedure and the sedation options and                         risks were discussed with the patient. All questions                         were answered and informed consent was obtained.                         Patient identification and proposed procedure were                         verified by the physician, the nurse, the anesthetist                         and the technician in the endoscopy suite. Mental                         Status Examination: alert and oriented. Airway                         Examination: normal oropharyngeal airway and neck                         mobility. Respiratory Examination: clear to                         auscultation. CV Examination: regular rate and rhythm.  Prophylactic Antibiotics: The patient does not require                         prophylactic antibiotics. Prior Anticoagulants: The                          patient has taken no anticoagulant or antiplatelet                         agents. ASA Grade Assessment: III - A patient with                         severe systemic disease. After reviewing the risks and                         benefits, the patient was deemed in satisfactory                         condition to undergo the procedure. The anesthesia                         plan was to use monitored anesthesia care (MAC).                         Immediately prior to administration of medications,                         the patient was re-assessed for adequacy to receive                         sedatives. The heart rate, respiratory rate, oxygen                         saturations, blood pressure, adequacy of pulmonary                         ventilation, and response to care were monitored                         throughout the procedure. The physical status of the                         patient was re-assessed after the procedure.                        After obtaining informed consent, the colonoscope was                         passed under direct vision. Throughout the procedure,                         the patient's blood pressure, pulse, and oxygen                         saturations were monitored continuously. The                         Colonoscope was introduced through the anus and  advanced to the the terminal ileum, with                         identification of the appendiceal orifice and IC                         valve. The colonoscopy was performed without                         difficulty. The patient tolerated the procedure well.                         The quality of the bowel preparation was evaluated                         using the BBPS [facility] Bowel Preparation Scale) with                         scores of: Right Colon = 2 (minor amount of residual                         staining, small fragments of stool and/or opaque                          liquid, but mucosa seen well), Transverse Colon = 3                         (entire mucosa seen well with no residual staining,                         small fragments of stool or opaque liquid) and Left                         Colon = 2 (minor amount of residual staining, small                         fragments of stool and/or opaque liquid, but mucosa                         seen well). The total BBPS score equals 7. The quality                         of the bowel preparation was good. The terminal ileum,                         ileocecal valve, appendiceal orifice, and rectum were                         photographed. Findings:      The perianal and digital rectal examinations were normal. Pertinent       negatives include normal sphincter tone.      The terminal ileum appeared normal. Estimated blood loss: none.      Retroflexion in the right colon was performed.      Scattered small-mouthed diverticula were found in the recto-sigmoid       colon. Estimated blood loss: none.      Non-bleeding internal hemorrhoids were  found during retroflexion. The       hemorrhoids were Grade I (internal hemorrhoids that do not prolapse).       Estimated blood loss: none.      The entire examined colon appeared normal on direct and retroflexion       views. Impression:            - The examined portion of the ileum was normal.                        - Diverticulosis in the recto-sigmoid colon.                        - Non-bleeding internal hemorrhoids.                        - The entire examined colon is normal on direct and                         retroflexion views.                        - No specimens collected. Recommendation:        - Patient has a contact number available for                         emergencies. The signs and symptoms of potential                         delayed complications were discussed with the patient.                         Return to normal  activities tomorrow. Written                         discharge instructions were provided to the patient.                        - Discharge patient to home.                        - Resume previous diet.                        - Continue present medications.                        - Repeat colonoscopy in 7 years for screening purposes.                        - Return to referring physician as previously                         scheduled.                        - The findings and recommendations were discussed with                         the patient. Procedure Code(s):     --- Professional ---  65784, Colonoscopy, flexible; diagnostic, including                         collection of specimen(s) by brushing or washing, when                         performed (separate procedure) Diagnosis Code(s):     --- Professional ---                        Z12.11, Encounter for screening for malignant neoplasm                         of colon                        K64.0, First degree hemorrhoids                        K57.30, Diverticulosis of large intestine without                         perforation or abscess without bleeding CPT copyright 2022 [employer]. All rights reserved. The codes documented in this report are preliminary and upon coder review may  be revised to meet current compliance requirements. Attending Participation:      I personally performed the entire procedure. Elfredia Nevins, DO Jaynie Collins DO, DO 02/19/2023 11:05:53 AM This report has been signed electronically. Number of Addenda: 0 Note Initiated On: 02/19/2023 10:24 AM Scope Withdrawal Time: 0 hours 11 minutes 42 seconds  Total Procedure Duration: 0 hours 21 minutes 47 seconds  Estimated Blood Loss:  Estimated blood loss: none.      [facility]

## 2023-02-22 ENCOUNTER — Encounter: Payer: Self-pay | Admitting: Gastroenterology

## 2023-02-23 ENCOUNTER — Ambulatory Visit
Admission: EM | Admit: 2023-02-23 | Discharge: 2023-02-23 | Disposition: A | Discharge status: Home / Self Care | Payer: Medicaid Other

## 2023-02-23 ENCOUNTER — Ambulatory Visit (INDEPENDENT_AMBULATORY_CARE_PROVIDER_SITE_OTHER): Payer: Medicaid Other

## 2023-02-23 DIAGNOSIS — J069 Acute upper respiratory infection, unspecified: Secondary | ICD-10-CM | POA: Diagnosis not present

## 2023-02-23 DIAGNOSIS — B9789 Other viral agents as the cause of diseases classified elsewhere: Secondary | ICD-10-CM | POA: Diagnosis not present

## 2023-02-23 DIAGNOSIS — R0989 Other specified symptoms and signs involving the circulatory and respiratory systems: Secondary | ICD-10-CM | POA: Diagnosis not present

## 2023-02-23 DIAGNOSIS — K9189 Other postprocedural complications and disorders of digestive system: Secondary | ICD-10-CM | POA: Insufficient documentation

## 2023-02-23 DIAGNOSIS — Z9889 Other specified postprocedural states: Secondary | ICD-10-CM | POA: Diagnosis not present

## 2023-02-23 DIAGNOSIS — R109 Unspecified abdominal pain: Secondary | ICD-10-CM

## 2023-02-23 DIAGNOSIS — Q211 Atrial septal defect, unspecified: Secondary | ICD-10-CM | POA: Diagnosis not present

## 2023-02-23 DIAGNOSIS — K589 Irritable bowel syndrome without diarrhea: Secondary | ICD-10-CM | POA: Insufficient documentation

## 2023-02-23 DIAGNOSIS — R141 Gas pain: Secondary | ICD-10-CM | POA: Diagnosis not present

## 2023-02-23 DIAGNOSIS — R051 Acute cough: Secondary | ICD-10-CM

## 2023-02-23 DIAGNOSIS — K5900 Constipation, unspecified: Secondary | ICD-10-CM | POA: Diagnosis not present

## 2023-02-23 DIAGNOSIS — R197 Diarrhea, unspecified: Secondary | ICD-10-CM | POA: Diagnosis not present

## 2023-02-23 DIAGNOSIS — R059 Cough, unspecified: Secondary | ICD-10-CM | POA: Diagnosis not present

## 2023-02-23 LAB — RESP PANEL BY RT-PCR (FLU A&B, COVID) ARPGX2
Influenza A by PCR: NEGATIVE
Influenza B by PCR: NEGATIVE
SARS Coronavirus 2 by RT PCR: NEGATIVE

## 2023-02-23 MED ORDER — IPRATROPIUM BROMIDE 0.06 % NA SOLN: 2.0000 | Freq: Four times a day (QID) | NASAL | 12 refills | Status: DC

## 2023-02-23 MED ORDER — BENZONATATE 100 MG PO CAPS: 200.0000 mg | ORAL_CAPSULE | Freq: Three times a day (TID) | ORAL | 0 refills | Status: DC
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
## 2023-02-23 NOTE — Discharge Instructions (Addendum)
Your testing for COVID influenza were negative and your chest x-ray did not demonstrate any evidence of pneumonia.  I do believe you have a viral respiratory infection which is causing your symptoms.  Your abdominal film did show a large volume of air in your transverse colon which could be contributing to your diarrhea and cramping.  This is not uncommon following colonoscopy.  The best way to mobilize this gas is to walk to increase its transition to your bowel to be expressed as flatulence.  Use over-the-counter Tylenol and ibuprofen according to pack instructions as needed for fever or pain.  Use the Tessalon Perles every 8 hours during the day as needed for cough.  Take them with a small sip of water.  They may give you numbness to the base of your tongue, or metallic taste in your mouth, this is normal.  You may also supplement the Tessalon Perles with over-the-counter plain Robitussin.  Additionally, I will prescribe Atrovent nasal spray and you can do 2 squirts up each nostril every 6 hours as needed for runny nose and nasal congestion.  If you develop any sharp abdominal pain, bloody stool, or fever you need to go to the ER for reevaluation.

## 2023-02-23 NOTE — ED Triage Notes (Signed)
Pt c/o diarrhea,cough,congestion & cramping x1 day. States had colonoscopy on Friday & sx's started after. Hx of IBS.

## 2023-02-24 ENCOUNTER — Ambulatory Visit: Payer: Medicaid Other | Admitting: Family Medicine

## 2023-02-26 ENCOUNTER — Encounter: Payer: Self-pay | Admitting: Physician Assistant

## 2023-02-26 ENCOUNTER — Ambulatory Visit: Payer: Medicaid Other | Admitting: Physician Assistant

## 2023-02-26 VITALS — BP 98/49 | HR 93 | Resp 16 | Ht <= 58 in | Wt 139.4 lb

## 2023-02-26 DIAGNOSIS — J302 Other seasonal allergic rhinitis: Secondary | ICD-10-CM

## 2023-02-26 DIAGNOSIS — J329 Chronic sinusitis, unspecified: Secondary | ICD-10-CM

## 2023-02-26 MED ORDER — LEVOCETIRIZINE DIHYDROCHLORIDE 5 MG PO TABS: 5.0000 mg | ORAL_TABLET | Freq: Every evening | ORAL | 2 refills | Status: DC

## 2023-02-26 MED ORDER — FLUTICASONE PROPIONATE 50 MCG/ACT NA SUSP: 2.0000 | Freq: Every day | NASAL | 6 refills | Status: DC

## 2023-02-26 NOTE — Progress Notes (Unsigned)
Established patient visit  Patient: Erin Richards   DOB: [birth year]   58 y.o. Female  MRN: [id number] Visit Date: 02/26/2023  Today's healthcare provider: Debera Lat, PA-C   Chief Complaint  Patient presents with   Nasal Congestion    Reports that it has been going on for a while. Reports nasal sprays are not working anymore. She has not seen a specialist about this. No other symptoms.   Subjective       Discussed the use of AI scribe software for clinical note transcription with the patient, who gave verbal consent to proceed.  History of Present Illness   The patient, with a history of sinusitis, presents with chronic left-sided nasal congestion. They report a continuous runny nose that has been worsening over the years despite the use of various nasal sprays including  azelastine, Flonase, and Atrovent. They also report post-nasal drainage and a burning sensation in the left nostril. They deny any associated headache, fever, or chest symptoms.  The patient also mentions a recent colonoscopy, after which they experienced bloating and vomiting due to residual air in the stomach. They were evaluated at an urgent care center for these symptoms, as well as for a runny nose, nasal congestion, and chills. They were diagnosed with an upper respiratory infection and prescribed Atrovent nasal spray, promethazine, and over-the-counter Tylenol and ibuprofen. However, these treatments have not improved their symptoms.  The patient also reports taking Zyrtec, Singular, and Sudafed for their nasal symptoms. They express frustration with the persistent left-sided nasal congestion and burning, which they believe is exacerbated by the nasal sprays. They also report a sensation of ear fullness and an enlarged tonsil on the left side.           03/03/2022    4:01 PM 10/27/2021    2:10 PM 07/24/2021   10:47 AM  Depression screen PHQ 2/9  Decreased Interest 1 1 1   Down, Depressed, Hopeless 1 0 0   PHQ - 2 Score 2 1 1   Altered sleeping 1 1 1   Tired, decreased energy 2 2 1   Change in appetite 1 2 1   Feeling bad or failure about yourself  0 0 0  Trouble concentrating 0 0 0  Moving slowly or fidgety/restless 0 0 0  Suicidal thoughts 0 0 0  PHQ-9 Score 6 6 4   Difficult doing work/chores Not difficult at all Not difficult at all Not difficult at all      05/19/2021   10:19 AM 11/22/2020   11:40 AM  GAD 7 : Generalized Anxiety Score  Nervous, Anxious, on Edge 3 1  Control/stop worrying 3 3  Worry too much - different things 3 3  Trouble relaxing 3 3  Restless 2 1  Easily annoyed or irritable 1 2  Afraid - awful might happen 0 1  Total GAD 7 Score 15 14  Anxiety Difficulty Not difficult at all Not difficult at all    Medications: Outpatient Medications Prior to Visit  Medication Sig   ACCU-CHEK GUIDE test strip USE UP TO FOUR TIMES DAILY AS DIRECTED.   Accu-Chek Softclix Lancets lancets Use to check blood sugar daily   ALPRAZolam (XANAX) 0.5 MG tablet Take 0.5-1 tablets (0.25-0.5 mg total) by mouth 2 (two) times daily as needed (pain attacks).   Azelastine HCl 137 MCG/SPRAY SOLN PLACE 1 SPRAY INTO BOTH NOSTRILS 2 (TWO) TIMES DAILY   bacitracin ointment Apply 1 Application topically 2 (two) times daily.   bacitracin-polymyxin b (POLYSPORIN) ophthalmic ointment  Place 1 Application into the left eye 3 (three) times daily.   benzonatate (TESSALON) 100 MG capsule Take 2 capsules (200 mg total) by mouth every 8 (eight) hours.   Blood Glucose Monitoring Suppl (ACCU-CHEK GUIDE) w/Device KIT Use daily to check blood sugars   buPROPion (WELLBUTRIN SR) 150 MG 12 hr tablet Take 150 mg by mouth every morning.   clonazePAM (KLONOPIN) 0.5 MG tablet Take 0.5 mg by mouth daily as needed.   clotrimazole-betamethasone (LOTRISONE) cream Apply to affected area 2 times daily prn   escitalopram (LEXAPRO) 20 MG tablet TAKE 1 TABLET BY MOUTH EVERY DAY   estradiol (ESTRACE) 0.5 MG tablet TAKE 1  TABLET BY MOUTH EVERY DAY   fluticasone (FLONASE) 50 MCG/ACT nasal spray PLACE 2 SPRAYS INTO BOTH NOSTRILS DAILY AS NEEDED FOR ALLERGIES OR RHINITIS.   hydrocortisone (ANUSOL-HC) 25 MG suppository Place 1 suppository (25 mg total) rectally 2 (two) times daily.   hyoscyamine (LEVSIN) 0.125 MG tablet TAKE ONE TABLET BY MOUTH EVERY 6 HOURS AS NEEDED FOR STOMACH CRAMPINGS   ibuprofen (ADVIL) 600 MG tablet Take 1 tablet (600 mg total) by mouth every 6 (six) hours as needed.   ipratropium (ATROVENT) 0.06 % nasal spray Place 2 sprays into both nostrils 4 (four) times daily.   Ipratropium-Albuterol (COMBIVENT RESPIMAT) 20-100 MCG/ACT AERS respimat INHALE 1 PUFF INTO THE LUNGS EVERY 4 HOURS AS NEEDED FOR WHEEZING.   linaclotide (LINZESS) 145 MCG CAPS capsule Take 1 capsule (145 mcg total) by mouth daily before breakfast.   methocarbamol (ROBAXIN) 500 MG tablet Take 1 tablet (500 mg total) by mouth every 8 (eight) hours as needed for muscle spasms.   montelukast (SINGULAIR) 10 MG tablet TAKE 1 TABLET BY MOUTH EVERYDAY AT BEDTIME   Multiple Vitamin (MULTIVITAMIN WITH MINERALS) TABS tablet Take 2 tablets by mouth daily.   naproxen (NAPROSYN) 500 MG tablet TAKE 1 TABLET (500 MG TOTAL) BY MOUTH 2 (TWO) TIMES DAILY WITH A MEAL. AS NEEDED FOR PAIN   nortriptyline (PAMELOR) 25 MG capsule TAKE ONE CAPSULE AT BEDTIME FOR MIGRANE PREVENTION AND IRRITABLE BOWEL SYNDROME   Olopatadine HCl 0.2 % SOLN Apply 1 drop to eye daily.   OXYGEN Inhale 2 L into the lungs at bedtime as needed.   pantoprazole (PROTONIX) 40 MG tablet TAKE 1 TABLET (40 MG TOTAL) BY MOUTH DAILY. FOR ACID REFLUX   promethazine (PHENERGAN) 12.5 MG tablet Take 1 tablet (12.5 mg total) by mouth every 8 (eight) hours as needed for nausea or vomiting.   Spacer/Aero-Holding Chambers (AEROCHAMBER PLUS) inhaler Use with inhaler   sucralfate (CARAFATE) 1 g tablet Take 1 tablet (1 g total) by mouth 4 (four) times daily -  with meals and at bedtime.    triamcinolone ointment (KENALOG) 0.5 % APPLY TO AFFECTED AREA TWICE A DAY   UBRELVY 50 MG TABS TAKE 50 MG BY MOUTH ONCE AS NEEDED FOR UP TO 1 DOSE (MIGRAINES, HEADACHES).   zolpidem (AMBIEN) 10 MG tablet TAKE 1 TABLET BY MOUTH EVERYDAY AT BEDTIME   [DISCONTINUED] cetirizine (ZYRTEC) 10 MG tablet Take 1 tablet (10 mg total) by mouth daily. For allergies   predniSONE (STERAPRED UNI-PAK 21 TAB) 10 MG (21) TBPK tablet Take by mouth daily. Take 6 tabs by mouth daily for 1, then 5 tabs for 1 day, then 4 tabs for 1 day, then 3 tabs for 1 day, then 2 tabs for 1 day, then 1 tab for 1 day.   No facility-administered medications prior to visit.    Review  of Systems All negative Except see HPI   {Insert previous labs (optional):23779} {See past labs  Heme  Chem  Endocrine  Serology  Results Review (optional):1}   Objective    BP (!) 98/49 (BP Location: Left Arm, Patient Position: Sitting, Cuff Size: Large)   Pulse 93   Resp 16   Ht 4\' 9"  (1.448 m)   Wt 139 lb 6.4 oz (63.2 kg)   SpO2 100%   BMI 30.17 kg/m  {Insert last BP/Wt (optional):23777}{See vitals history (optional):1}   Physical Exam   No results found for any visits on 02/26/23.      Assessment and Plan    Chronic Rhinitis Persistent left-sided nasal congestion and runny nose despite use of multiple nasal sprays including azelastine, Flonase, and Atrovent. No associated fever, headache, or cough. Noted to have left-sided tonsillar enlargement and discomfort. -Discontinue all current nasal sprays except Flonase. -Consider use of nasal saline gel in conjunction with Flonase. -Referral to ENT for further evaluation and management. -Consideration of oral prednisone for inflammation, pending discussion with Dr. Sherrie Mustache. In the past, pt took prednisone without side effects. -Consideration of antibiotic therapy -Encouraged to maintain proper hydration.   Post-Colonoscopy Complications History of bloating and vomiting  post-colonoscopy due to retained air. No current complaints related to this issue. -No specific plan discussed in the conversation.   Orders Placed This Encounter  Procedures   Ambulatory referral to ENT    Referral Priority:   Urgent    Referral Type:   Consultation    Referral Reason:   Specialty Services Required    Requested Specialty:   Otolaryngology    Number of Visits Requested:   1    No follow-ups on file.   The patient was advised to call back or seek an in-person evaluation if the symptoms worsen or if the condition fails to improve as anticipated.  I discussed the assessment and treatment plan with the patient. The patient was provided an opportunity to ask questions and all were answered. The patient agreed with the plan and demonstrated an understanding of the instructions.  I, Debera Lat, PA-C have reviewed all documentation for this visit. The documentation on 02/26/2023  for the exam, diagnosis, procedures, and orders are all accurate and complete.  Debera Lat, [facility], MMS [facility] [phone] (phone) [phone] (fax)  [facility]

## 2023-03-01 ENCOUNTER — Telehealth: Payer: Self-pay | Admitting: Family Medicine

## 2023-03-01 NOTE — Telephone Encounter (Signed)
Pt is calling in because she saw Debera Lat on Friday and says that [place] told her if she wasn't feeling better to call back. Pt says she is not feeling better and wants to know what the next steps would be.

## 2023-03-03 DIAGNOSIS — J302 Other seasonal allergic rhinitis: Secondary | ICD-10-CM | POA: Diagnosis not present

## 2023-03-03 DIAGNOSIS — J301 Allergic rhinitis due to pollen: Secondary | ICD-10-CM | POA: Diagnosis not present

## 2023-03-04 ENCOUNTER — Encounter: Payer: Self-pay | Admitting: Physician Assistant

## 2023-03-04 DIAGNOSIS — J329 Chronic sinusitis, unspecified: Secondary | ICD-10-CM

## 2023-03-04 DIAGNOSIS — J302 Other seasonal allergic rhinitis: Secondary | ICD-10-CM

## 2023-03-04 NOTE — Telephone Encounter (Signed)
Pt called back, says still has runny, nose, and  coughing up green and ear stopped up

## 2023-03-04 NOTE — Telephone Encounter (Signed)
Attempted to call patient. No answer. Left message for patient to return call.   PEC if patient returns call, you could obtain a description of her current symptoms for [place].

## 2023-03-05 MED ORDER — AMOXICILLIN-POT CLAVULANATE 875-125 MG PO TABS: 1.0000 | ORAL_TABLET | Freq: Two times a day (BID) | ORAL | 0 refills | Status: DC

## 2023-03-05 MED ORDER — PREDNISONE 20 MG PO TABS: 20.0000 mg | ORAL_TABLET | Freq: Two times a day (BID) | ORAL | 0 refills | Status: DC

## 2023-03-09 DIAGNOSIS — F331 Major depressive disorder, recurrent, moderate: Secondary | ICD-10-CM | POA: Diagnosis not present

## 2023-03-09 DIAGNOSIS — F4310 Post-traumatic stress disorder, unspecified: Secondary | ICD-10-CM | POA: Diagnosis not present

## 2023-03-09 DIAGNOSIS — F5102 Adjustment insomnia: Secondary | ICD-10-CM | POA: Diagnosis not present

## 2023-03-09 DIAGNOSIS — F411 Generalized anxiety disorder: Secondary | ICD-10-CM | POA: Diagnosis not present

## 2023-03-17 DIAGNOSIS — F331 Major depressive disorder, recurrent, moderate: Secondary | ICD-10-CM | POA: Diagnosis not present

## 2023-03-17 DIAGNOSIS — F411 Generalized anxiety disorder: Secondary | ICD-10-CM | POA: Diagnosis not present

## 2023-03-17 DIAGNOSIS — F4310 Post-traumatic stress disorder, unspecified: Secondary | ICD-10-CM | POA: Diagnosis not present

## 2023-03-18 DIAGNOSIS — R0902 Hypoxemia: Secondary | ICD-10-CM | POA: Diagnosis not present

## 2023-03-18 DIAGNOSIS — Q121 Congenital displaced lens: Secondary | ICD-10-CM | POA: Diagnosis not present

## 2023-03-20 ENCOUNTER — Other Ambulatory Visit: Payer: Self-pay | Admitting: Family Medicine

## 2023-03-20 DIAGNOSIS — J301 Allergic rhinitis due to pollen: Secondary | ICD-10-CM

## 2023-03-22 NOTE — Telephone Encounter (Signed)
Refused Zyrtec 10 mg because it was discontinued 02/26/2023

## 2023-03-24 ENCOUNTER — Encounter: Payer: Self-pay | Admitting: Family Medicine

## 2023-03-24 ENCOUNTER — Other Ambulatory Visit: Payer: Self-pay | Admitting: Family Medicine

## 2023-03-24 DIAGNOSIS — J4540 Moderate persistent asthma, uncomplicated: Secondary | ICD-10-CM

## 2023-03-24 DIAGNOSIS — R059 Cough, unspecified: Secondary | ICD-10-CM

## 2023-03-24 DIAGNOSIS — J349 Unspecified disorder of nose and nasal sinuses: Secondary | ICD-10-CM

## 2023-03-24 DIAGNOSIS — R0602 Shortness of breath: Secondary | ICD-10-CM

## 2023-03-25 MED ORDER — COMBIVENT RESPIMAT 20-100 MCG/ACT IN AERS: INHALATION_SPRAY | RESPIRATORY_TRACT | 0 refills | Status: DC

## 2023-03-28 MED ORDER — VENTOLIN HFA 108 (90 BASE) MCG/ACT IN AERS: 2.0000 | INHALATION_SPRAY | Freq: Four times a day (QID) | RESPIRATORY_TRACT | 3 refills | Status: DC | PRN

## 2023-03-31 DIAGNOSIS — F411 Generalized anxiety disorder: Secondary | ICD-10-CM | POA: Diagnosis not present

## 2023-03-31 DIAGNOSIS — F331 Major depressive disorder, recurrent, moderate: Secondary | ICD-10-CM | POA: Diagnosis not present

## 2023-03-31 DIAGNOSIS — F4310 Post-traumatic stress disorder, unspecified: Secondary | ICD-10-CM | POA: Diagnosis not present

## 2023-04-09 DIAGNOSIS — J302 Other seasonal allergic rhinitis: Secondary | ICD-10-CM | POA: Diagnosis not present

## 2023-04-09 DIAGNOSIS — F4310 Post-traumatic stress disorder, unspecified: Secondary | ICD-10-CM | POA: Diagnosis not present

## 2023-04-13 ENCOUNTER — Emergency Department

## 2023-04-13 ENCOUNTER — Other Ambulatory Visit: Payer: Self-pay

## 2023-04-13 ENCOUNTER — Emergency Department
Admission: EM | Admit: 2023-04-13 | Discharge: 2023-04-14 | Disposition: A | Discharge status: Home / Self Care | Attending: Emergency Medicine | Admitting: Emergency Medicine

## 2023-04-13 DIAGNOSIS — Z8616 Personal history of COVID-19: Secondary | ICD-10-CM | POA: Diagnosis not present

## 2023-04-13 DIAGNOSIS — F411 Generalized anxiety disorder: Secondary | ICD-10-CM | POA: Diagnosis not present

## 2023-04-13 DIAGNOSIS — I517 Cardiomegaly: Secondary | ICD-10-CM | POA: Diagnosis not present

## 2023-04-13 DIAGNOSIS — F4310 Post-traumatic stress disorder, unspecified: Secondary | ICD-10-CM | POA: Diagnosis not present

## 2023-04-13 DIAGNOSIS — J069 Acute upper respiratory infection, unspecified: Secondary | ICD-10-CM | POA: Insufficient documentation

## 2023-04-13 DIAGNOSIS — R079 Chest pain, unspecified: Secondary | ICD-10-CM | POA: Diagnosis not present

## 2023-04-13 DIAGNOSIS — R0789 Other chest pain: Secondary | ICD-10-CM | POA: Diagnosis not present

## 2023-04-13 DIAGNOSIS — F331 Major depressive disorder, recurrent, moderate: Secondary | ICD-10-CM | POA: Diagnosis not present

## 2023-04-13 DIAGNOSIS — F5102 Adjustment insomnia: Secondary | ICD-10-CM | POA: Diagnosis not present

## 2023-04-13 DIAGNOSIS — R059 Cough, unspecified: Secondary | ICD-10-CM | POA: Diagnosis present

## 2023-04-13 DIAGNOSIS — B9789 Other viral agents as the cause of diseases classified elsewhere: Secondary | ICD-10-CM | POA: Diagnosis not present

## 2023-04-13 LAB — CBC
HCT: 39.5 % (ref 36.0–46.0)
Hemoglobin: 13.1 g/dL (ref 12.0–15.0)
MCH: 28.2 pg (ref 26.0–34.0)
MCHC: 33.2 g/dL (ref 30.0–36.0)
MCV: 84.9 fL (ref 80.0–100.0)
Platelets: 197 10*3/uL (ref 150–400)
RBC: 4.65 MIL/uL (ref 3.87–5.11)
RDW: 13.9 % (ref 11.5–15.5)
WBC: 6.4 10*3/uL (ref 4.0–10.5)
nRBC: 0 % (ref 0.0–0.2)

## 2023-04-13 LAB — RESP PANEL BY RT-PCR (RSV, FLU A&B, COVID)  RVPGX2
Influenza A by PCR: NEGATIVE
Influenza B by PCR: NEGATIVE
Resp Syncytial Virus by PCR: NEGATIVE
SARS Coronavirus 2 by RT PCR: NEGATIVE

## 2023-04-13 LAB — TROPONIN I (HIGH SENSITIVITY)
Troponin I (High Sensitivity): 4 ng/L (ref <18)
Troponin I (High Sensitivity): 4 ng/L (ref <18)

## 2023-04-13 LAB — BASIC METABOLIC PANEL
Anion gap: 10 (ref 5–15)
BUN: 7 mg/dL (ref 6–20)
CO2: 25 mmol/L (ref 22–32)
Calcium: 9.6 mg/dL (ref 8.9–10.3)
Chloride: 104 mmol/L (ref 98–111)
Creatinine, Ser: 0.71 mg/dL (ref 0.44–1.00)
GFR, Estimated: >60 mL/min (ref >60)
Glucose, Bld: 90 mg/dL (ref 70–99)
Potassium: 4.1 mmol/L (ref 3.5–5.1)
Sodium: 139 mmol/L (ref 135–145)

## 2023-04-13 NOTE — ED Triage Notes (Signed)
 Pt reports chest pain that began this morning and a cough that she has had for 3 weeks.

## 2023-04-14 ENCOUNTER — Other Ambulatory Visit: Payer: Self-pay | Admitting: Family Medicine

## 2023-04-14 DIAGNOSIS — N951 Menopausal and female climacteric states: Secondary | ICD-10-CM

## 2023-04-14 DIAGNOSIS — Z79890 Hormone replacement therapy (postmenopausal): Secondary | ICD-10-CM

## 2023-04-14 MED ORDER — ONDANSETRON 4 MG PO TBDP: 4.0000 mg | ORAL_TABLET | Freq: Four times a day (QID) | ORAL | 0 refills | Status: DC | PRN

## 2023-04-14 MED ORDER — AZITHROMYCIN 500 MG PO TABS
500.0000 mg | ORAL_TABLET | Freq: Once | ORAL | Status: AC
  Administered 2023-04-14: 500 mg via ORAL
  Filled 2023-04-14: qty 1

## 2023-04-14 MED ORDER — PREDNISONE 20 MG PO TABS
60.0000 mg | ORAL_TABLET | Freq: Once | ORAL | Status: AC
  Administered 2023-04-14: 60 mg via ORAL
  Filled 2023-04-14: qty 3

## 2023-04-14 MED ORDER — ACETAMINOPHEN 500 MG PO TABS
1000.0000 mg | ORAL_TABLET | Freq: Once | ORAL | Status: AC
  Administered 2023-04-14: 1000 mg via ORAL
  Filled 2023-04-14: qty 2

## 2023-04-14 MED ORDER — IPRATROPIUM-ALBUTEROL 0.5-2.5 (3) MG/3ML IN SOLN
3.0000 mL | Freq: Once | RESPIRATORY_TRACT | Status: AC
  Administered 2023-04-14: 3 mL via RESPIRATORY_TRACT
  Filled 2023-04-14: qty 3

## 2023-04-14 MED ORDER — HYDROCOD POLI-CHLORPHE POLI ER 10-8 MG/5ML PO SUER: 5.0000 mL | Freq: Two times a day (BID) | ORAL | 0 refills | Status: DC | PRN

## 2023-04-14 MED ORDER — AZITHROMYCIN 250 MG PO TABS: ORAL_TABLET | ORAL | 0 refills | Status: DC

## 2023-04-14 MED ORDER — PREDNISONE 10 MG (21) PO TBPK: ORAL_TABLET | ORAL | 0 refills | Status: DC

## 2023-04-14 MED ORDER — ACCU-CHEK GUIDE W/DEVICE KIT
PACK | 0 refills | Status: AC
Start: 2023-04-14 — End: ?
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
## 2023-04-14 NOTE — Discharge Instructions (Signed)

## 2023-04-14 NOTE — Telephone Encounter (Signed)
 Copied from CRM [id number]. Topic: Clinical - Medication Refill >> Apr 14, 2023 10:13 AM Franchot Heidelberg wrote: Most Recent Primary Care Visit:  Provider: Debera Lat  Department: [id number]  Visit Type: OFFICE VISIT  Date: 02/26/2023  Medication: Promethazine cough syrup, pt reports that she was seen at the ED yesterday and they called in hydrocodone cough syrup however her insurance will not cover it.   Accu-Chek Softclix Lancets lancets   Has the patient contacted their pharmacy? Yes (Agent: If no, request that the patient contact the pharmacy for the refill. If patient does not wish to contact the pharmacy document the reason why and proceed with request.) (Agent: If yes, when and what did the pharmacy advise?)  Is this the correct pharmacy for this prescription? Yes If no, delete pharmacy and type the correct one.  This is the patient's preferred pharmacy:  [id number] - [place], [place] - [address] [place] [address] Phone: [phone] Fax: [phone]   Has the prescription been filled recently? Yes  Is the patient out of the medication? Yes  Has the patient been seen for an appointment in the last year OR does the patient have an upcoming appointment? Yes  Can we respond through MyChart? Yes  Agent: Please be advised that Rx refills may take up to 3 business days. We ask that you follow-up with your pharmacy.

## 2023-04-15 ENCOUNTER — Ambulatory Visit: Payer: Self-pay | Admitting: Family Medicine

## 2023-04-15 ENCOUNTER — Emergency Department

## 2023-04-15 ENCOUNTER — Other Ambulatory Visit: Payer: Self-pay

## 2023-04-15 ENCOUNTER — Encounter: Payer: Self-pay | Admitting: Nurse Practitioner

## 2023-04-15 ENCOUNTER — Emergency Department
Admission: EM | Admit: 2023-04-15 | Discharge: 2023-04-16 | Disposition: A | Discharge status: Home / Self Care | Attending: Emergency Medicine | Admitting: Emergency Medicine

## 2023-04-15 ENCOUNTER — Telehealth: Admitting: Nurse Practitioner

## 2023-04-15 VITALS — HR 89 | Ht 62.0 in | Wt 139.0 lb

## 2023-04-15 DIAGNOSIS — J40 Bronchitis, not specified as acute or chronic: Secondary | ICD-10-CM | POA: Insufficient documentation

## 2023-04-15 DIAGNOSIS — I213 ST elevation (STEMI) myocardial infarction of unspecified site: Secondary | ICD-10-CM | POA: Diagnosis not present

## 2023-04-15 DIAGNOSIS — R161 Splenomegaly, not elsewhere classified: Secondary | ICD-10-CM | POA: Diagnosis not present

## 2023-04-15 DIAGNOSIS — R079 Chest pain, unspecified: Secondary | ICD-10-CM | POA: Diagnosis not present

## 2023-04-15 DIAGNOSIS — R069 Unspecified abnormalities of breathing: Secondary | ICD-10-CM | POA: Diagnosis not present

## 2023-04-15 DIAGNOSIS — I4510 Unspecified right bundle-branch block: Secondary | ICD-10-CM | POA: Diagnosis not present

## 2023-04-15 DIAGNOSIS — R0902 Hypoxemia: Secondary | ICD-10-CM | POA: Diagnosis not present

## 2023-04-15 DIAGNOSIS — R0602 Shortness of breath: Secondary | ICD-10-CM | POA: Diagnosis not present

## 2023-04-15 DIAGNOSIS — R0789 Other chest pain: Secondary | ICD-10-CM | POA: Diagnosis not present

## 2023-04-15 DIAGNOSIS — J984 Other disorders of lung: Secondary | ICD-10-CM | POA: Diagnosis not present

## 2023-04-15 DIAGNOSIS — R918 Other nonspecific abnormal finding of lung field: Secondary | ICD-10-CM | POA: Diagnosis not present

## 2023-04-15 DIAGNOSIS — R0689 Other abnormalities of breathing: Secondary | ICD-10-CM | POA: Diagnosis not present

## 2023-04-15 DIAGNOSIS — Q121 Congenital displaced lens: Secondary | ICD-10-CM | POA: Diagnosis not present

## 2023-04-15 LAB — CBC WITH DIFFERENTIAL/PLATELET
Abs Immature Granulocytes: 0.07 10*3/uL (ref 0.00–0.07)
Basophils Absolute: 0 10*3/uL (ref 0.0–0.1)
Basophils Relative: 0 %
Eosinophils Absolute: 0 10*3/uL (ref 0.0–0.5)
Eosinophils Relative: 0 %
HCT: 36.7 % (ref 36.0–46.0)
Hemoglobin: 12.4 g/dL (ref 12.0–15.0)
Immature Granulocytes: 1 %
Lymphocytes Relative: 12 %
Lymphs Abs: 1 10*3/uL (ref 0.7–4.0)
MCH: 28.3 pg (ref 26.0–34.0)
MCHC: 33.8 g/dL (ref 30.0–36.0)
MCV: 83.8 fL (ref 80.0–100.0)
Monocytes Absolute: 0.2 10*3/uL (ref 0.1–1.0)
Monocytes Relative: 2 %
Neutro Abs: 6.6 10*3/uL (ref 1.7–7.7)
Neutrophils Relative %: 85 %
Platelets: 194 10*3/uL (ref 150–400)
RBC: 4.38 MIL/uL (ref 3.87–5.11)
RDW: 13.7 % (ref 11.5–15.5)
WBC: 7.9 10*3/uL (ref 4.0–10.5)
nRBC: 0 % (ref 0.0–0.2)

## 2023-04-15 LAB — COMPREHENSIVE METABOLIC PANEL
ALT: 12 U/L (ref 0–44)
AST: 25 U/L (ref 15–41)
Albumin: 3.8 g/dL (ref 3.5–5.0)
Alkaline Phosphatase: 66 U/L (ref 38–126)
Anion gap: 10 (ref 5–15)
BUN: 11 mg/dL (ref 6–20)
CO2: 20 mmol/L — ABNORMAL LOW (ref 22–32)
Calcium: 9.5 mg/dL (ref 8.9–10.3)
Chloride: 106 mmol/L (ref 98–111)
Creatinine, Ser: 0.73 mg/dL (ref 0.44–1.00)
GFR, Estimated: >60 mL/min (ref >60)
Glucose, Bld: 141 mg/dL — ABNORMAL HIGH (ref 70–99)
Potassium: 4 mmol/L (ref 3.5–5.1)
Sodium: 136 mmol/L (ref 135–145)
Total Bilirubin: 0.8 mg/dL (ref 0.0–1.2)
Total Protein: 6.8 g/dL (ref 6.5–8.1)

## 2023-04-15 LAB — RESP PANEL BY RT-PCR (RSV, FLU A&B, COVID)  RVPGX2
Influenza A by PCR: NEGATIVE
Influenza B by PCR: NEGATIVE
Resp Syncytial Virus by PCR: NEGATIVE
SARS Coronavirus 2 by RT PCR: NEGATIVE

## 2023-04-15 LAB — TROPONIN I (HIGH SENSITIVITY): Troponin I (High Sensitivity): 3 ng/L (ref <18)

## 2023-04-15 MED ORDER — IOHEXOL 350 MG/ML SOLN
75.0000 mL | Freq: Once | INTRAVENOUS | Status: AC | PRN
  Administered 2023-04-15: 75 mL via INTRAVENOUS

## 2023-04-15 MED ORDER — CEFUROXIME AXETIL 500 MG PO TABS: 500.0000 mg | ORAL_TABLET | Freq: Two times a day (BID) | ORAL | 0 refills | Status: AC

## 2023-04-15 MED ORDER — IBUPROFEN 400 MG PO TABS
400.0000 mg | ORAL_TABLET | Freq: Once | ORAL | Status: AC
  Administered 2023-04-15: 400 mg via ORAL
  Filled 2023-04-15: qty 1

## 2023-04-15 MED ORDER — IPRATROPIUM-ALBUTEROL 0.5-2.5 (3) MG/3ML IN SOLN
3.0000 mL | Freq: Once | RESPIRATORY_TRACT | Status: AC
  Administered 2023-04-15: 3 mL via RESPIRATORY_TRACT
  Filled 2023-04-15: qty 3

## 2023-04-15 MED ORDER — ONDANSETRON HCL 4 MG/2ML IJ SOLN
4.0000 mg | Freq: Once | INTRAMUSCULAR | Status: AC
  Administered 2023-04-15: 4 mg via INTRAVENOUS
  Filled 2023-04-15: qty 2

## 2023-04-15 MED ORDER — LACTATED RINGERS IV BOLUS
1000.0000 mL | Freq: Once | INTRAVENOUS | Status: AC
  Administered 2023-04-15: 1000 mL via INTRAVENOUS

## 2023-04-15 NOTE — Telephone Encounter (Signed)
  Chief Complaint: shortness of breath Symptoms: intermittent shortness of breath Frequency: 2 days Pertinent Negatives: Patient denies fever Disposition: [] ED /[] Urgent Care (no appt availability in office) / [x] Appointment(In office/virtual)/ []  [facility]/ [] Home Care/ [] Refused Recommended Disposition /[] [facility]/ []  Follow-up with PCP Additional Notes:  Evaluated in the emergency room on Tuesday for cough. She was diagnosed with an upper respiratory infection, covid 19 and flu tests resulted negative. She states ER said she "may have beginning of pneumonia". She is using cough medicine as advised but it is not working.  She is still very fatigue. Reports new shortness of breath since ER evaluation, using prescribed inhalers as instructed. O2 sat is currently 97%. She has oxygen in the home if needed. Follow up evaluation advised. PCP does not have appointment availability today, offered another location but patient declines due to travel, accepts video visit with another provider. Care advise provided per protocol.   Copied from CRM [id number]. Topic: Clinical - Red Word Triage >> Apr 15, 2023  3:10 PM Turkey B wrote: [facility] that prompted transfer to Nurse Triage: pt still has sob and chest tight Reason for Disposition  [1] MILD difficulty breathing (e.g., minimal/no SOB at rest, SOB with walking, pulse <100) AND [2] NEW-onset or WORSE than normal  Answer Assessment - Initial Assessment Questions 1. RESPIRATORY STATUS: "Describe your breathing?" (e.g., wheezing, shortness of breath, unable to speak, severe coughing)      Shortness of breath intermittently 2. ONSET: "When did this breathing problem begin?"      Tuesday 3. PATTERN "Does the difficult breathing come and go, or has it been constant since it started?"      Constant 4. SEVERITY: "How bad is your breathing?" (e.g., mild, moderate, severe)    - MILD: No SOB at rest, mild SOB with walking, speaks  normally in sentences, can lie down, no retractions, pulse < 100.    - MODERATE: SOB at rest, SOB with minimal exertion and prefers to sit, cannot lie down flat, speaks in phrases, mild retractions, audible wheezing, pulse 100-120.    - SEVERE: Very SOB at rest, speaks in single words, struggling to breathe, sitting hunched forward, retractions, pulse > 120      Moderate 5. RECURRENT SYMPTOM: "Have you had difficulty breathing before?" If Yes, ask: "When was the last time?" and "What happened that time?"      No 6. CARDIAC HISTORY: "Do you have any history of heart disease?" (e.g., heart attack, angina, bypass surgery, angioplasty)      Yes, "hole in heart" 7. LUNG HISTORY: "Do you have any history of lung disease?"  (e.g., pulmonary embolus, asthma, emphysema)     Yes 8. CAUSE: "What do you think is causing the breathing problem?"      Upper respiratory illness 9. OTHER SYMPTOMS: "Do you have any other symptoms? (e.g., dizziness, runny nose, cough, chest pain, fever)     Cough, runny nose, chest pain, fever.  10. O2 SATURATION MONITOR:  "Do you use an oxygen saturation monitor (pulse oximeter) at home?" If Yes, ask: "What is your reading (oxygen level) today?" "What is your usual oxygen saturation reading?" (e.g., 95%)       97  Protocols used: Breathing Difficulty-A-AH

## 2023-04-15 NOTE — Progress Notes (Deleted)
 Patient note seen.  Appointment cancelled.

## 2023-04-15 NOTE — Discharge Instructions (Addendum)
 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  This has not changed since 2022 however your doctor may want to continue to monitor this every few years.  Please follow-up with your primary care doctor regarding this.

## 2023-04-15 NOTE — ED Provider Notes (Signed)
 [facility] Provider Note    Event Date/Time   First MD Initiated Contact with Patient 04/15/23 2000     (approximate)   History   Chief Complaint Chest Pain   HPI  Erin Richards is a 58 y.o. female with past medical history of atrial septal defect, mitral valve repair, anemia, and migraines who presents to the ED complaining of chest pain.  Patient reports that she has been dealing with cough and congestion for the past 3 weeks, now increasingly short of breath over the past couple of days.  She also describes tightness and discomfort in her chest, has not had any pain or swelling in her legs.  She has felt generally weak with subjective fevers and chills for, denies any sick contacts.  She was started on antibiotics 2 days ago for possible pneumonia, has continued to get worse despite this.     Physical Exam   Triage Vital Signs: ED Triage Vitals  Encounter Vitals Group     BP      Systolic BP Percentile      Diastolic BP Percentile      Pulse      Resp      Temp      Temp src      SpO2      Weight      Height      Head Circumference      Peak Flow      Pain Score      Pain Loc      Pain Education      Exclude from Growth Chart     Most recent vital signs: Vitals:   04/15/23 2005  BP: (!) 109/56  Pulse: 75  Resp: (!) 23  Temp: 97.6 F (36.4 C)  SpO2: 100%    Constitutional: Alert and oriented. Eyes: Conjunctivae are normal. Head: Atraumatic. Nose: No congestion/rhinnorhea. Mouth/Throat: Mucous membranes are moist.  Cardiovascular: Normal rate, regular rhythm. Grossly normal heart sounds.  2+ radial pulses bilaterally. Respiratory: Normal respiratory effort.  No retractions. Lungs CTAB. Gastrointestinal: Soft and nontender. No distention. Musculoskeletal: No lower extremity tenderness nor edema.  Neurologic:  Normal speech and language. No gross focal neurologic deficits are appreciated.    ED Results / Procedures /  Treatments   Labs (all labs ordered are listed, but only abnormal results are displayed) Labs Reviewed  COMPREHENSIVE METABOLIC PANEL - Abnormal; Notable for the following components:      Result Value   CO2 20 (*)    Glucose, Bld 141 (*)    All other components within normal limits  RESP PANEL BY RT-PCR (RSV, FLU A&B, COVID)  [id number]  CBC WITH DIFFERENTIAL/PLATELET  TROPONIN I (HIGH SENSITIVITY)  TROPONIN I (HIGH SENSITIVITY)     EKG  ED ECG REPORT I, Chesley Noon, the attending physician, personally viewed and interpreted this ECG.   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  PROCEDURES:  Critical Care performed: No  Procedures   MEDICATIONS ORDERED IN ED: Medications  ibuprofen (ADVIL) tablet 400 mg (has no administration in time range)  ipratropium-albuterol (DUONEB) 0.5-2.5 (3) MG/3ML nebulizer solution 3 mL (3 mLs Nebulization Given 04/15/23 2021)  ondansetron (ZOFRAN) injection 4 mg (4 mg Intravenous Given 04/15/23 2021)  lactated ringers bolus 1,000 mL (0 mLs Intravenous Stopped 04/15/23 2114)  iohexol (OMNIPAQUE) 350 MG/ML injection 75 mL (75 mLs Intravenous Contrast Given 04/15/23 2150)     IMPRESSION / MDM / ASSESSMENT AND PLAN / ED COURSE  I reviewed the triage vital signs and the nursing notes.                              58 y.o. female with past medical history of atrial septal defect, mitral valve repair, anemia, and migraines who presents to the ED complaining of increasing difficulty breathing with chest tightness and recent cough and congestion.  Patient's presentation is most consistent with acute presentation with potential threat to life or bodily function.  Differential diagnosis includes, but is not limited to,  viral syndrome, COVID-19, influenza, pneumonia, CHF, ACS, PE, anemia, electrolyte abnormality, AKI.  Patient nontoxic-appearing and in no acute distress, vital signs remarkable for mild tachypnea but otherwise reassuring.  She did become nauseous and started actively vomiting during my assessment, symptoms seem most consistent with viral syndrome and we will repeat testing for COVID and flu.  Plan to also recheck chest x-ray.  EKG shows bifascicular block, read by the computer as concerning for MI, however not consistent with STEMI by my read.  Clinically, symptoms seem less likely to represent ACS, troponin pending at this time.  We will treat symptomatically with IV Zofran, hydrate with IV fluids, and give DuoNeb.  Chest x-ray unremarkable by my read, labs without significant anemia, leukocytosis, electrolyte abnormality, or AKI.  LFTs are unremarkable and troponin within normal limits.  Given worsening symptoms with chest pain and shortness of breath, will check CTA chest for occult pneumonia versus pulmonary embolism.  Patient does report feeling slightly better following breathing treatment.  Patient turned over to oncoming rider pending CTA results and reassessment.      FINAL CLINICAL IMPRESSION(S) / ED DIAGNOSES   Final diagnoses:  Atypical chest pain  Bronchitis     Rx / DC Orders   ED Discharge Orders          Ordered    cefUROXime (CEFTIN) 500 MG tablet  2 times daily with meals        04/15/23 2227             Note:  This document was prepared using Dragon voice recognition software and may include unintentional dictation errors.   Chesley Noon, MD 04/15/23 2229

## 2023-04-15 NOTE — Addendum Note (Signed)
 Addended by: Larae Grooms on: 04/15/2023 04:00 PM   Modules accepted: Level of Service

## 2023-04-15 NOTE — Telephone Encounter (Signed)
Please see the attached note

## 2023-04-15 NOTE — Progress Notes (Signed)
 Pulse 89   Ht 5\' 2"  (1.575 m)   Wt 139 lb (63 kg)   SpO2 96%   BMI 25.42 kg/m    Subjective:    Patient ID: Erin Richards, female    DOB: 04/04/1965, 58 y.o.   MRN: [id number]  HPI: Erin Richards is a 58 y.o. female  Chief Complaint  Patient presents with   Shortness of Breath    Started a few days ago, becomes tired with daily activities   Hospitalization Follow-up   UPPER RESPIRATORY TRACT INFECTION Worst symptom: patient states she came down with a cough about 3 weeks ago.  Patient states her symptoms are not getting better.  She was seen in ER on Tuesday and was told that she sounds like she has the beginning sounds of pneumonia.  Today she has had to use her oxygen because her o2 sats dropped down to 94%.  The lowest she has seen her oxygen at 93%.  Oxygen is improving to 96%.  She has already been treated with prednisone, zpak, and cough medication.  She is having SOB especially when she is walking.  She is also feeling really fatigued.    Relevant past medical, surgical, family and social history reviewed and updated as indicated. Interim medical history since our last visit reviewed. Allergies and medications reviewed and updated.  Review of Systems  Constitutional:  Positive for fatigue.  Respiratory:  Positive for shortness of breath.     Per HPI unless specifically indicated above     Objective:    Pulse 89   Ht 5\' 2"  (1.575 m)   Wt 139 lb (63 kg)   SpO2 96%   BMI 25.42 kg/m   Wt Readings from Last 3 Encounters:  04/15/23 139 lb (63 kg)  04/13/23 160 lb (72.6 kg)  02/26/23 139 lb 6.4 oz (63.2 kg)    Physical Exam Vitals and nursing note reviewed.  HENT:     Head: Normocephalic.     Right Ear: Hearing normal.     Left Ear: Hearing normal.     Nose: Nose normal.  Eyes:     Pupils: Pupils are equal, round, and reactive to light.  Pulmonary:     Effort: Pulmonary effort is normal. No respiratory distress.  Neurological:     Mental Status: She is  alert.  Psychiatric:        Mood and Affect: Mood normal.        Behavior: Behavior normal.        Thought Content: Thought content normal.        Judgment: Judgment normal.     Results for orders placed or performed during the hospital encounter of 04/13/23  Resp panel by RT-PCR (RSV, Flu A&B, Covid) Anterior Nasal Swab   Collection Time: 04/13/23  8:11 PM   Specimen: Anterior Nasal Swab  Result Value Ref Range   SARS Coronavirus 2 by RT PCR NEGATIVE NEGATIVE   Influenza A by PCR NEGATIVE NEGATIVE   Influenza B by PCR NEGATIVE NEGATIVE   Resp Syncytial Virus by PCR NEGATIVE NEGATIVE  Basic metabolic panel   Collection Time: 04/13/23  8:11 PM  Result Value Ref Range   Sodium 139 135 - 145 mmol/L   Potassium 4.1 3.5 - 5.1 mmol/L   Chloride 104 98 - 111 mmol/L   CO2 25 22 - 32 mmol/L   Glucose, Bld 90 70 - 99 mg/dL   BUN 7 6 - 20 mg/dL   Creatinine,  Ser 0.71 0.44 - 1.00 mg/dL   Calcium 9.6 8.9 - 16.1 mg/dL   GFR, Estimated >09 >60 mL/min   Anion gap 10 5 - 15  CBC   Collection Time: 04/13/23  8:11 PM  Result Value Ref Range   WBC 6.4 4.0 - 10.5 K/uL   RBC 4.65 3.87 - 5.11 MIL/uL   Hemoglobin 13.1 12.0 - 15.0 g/dL   HCT 45.4 09.8 - 11.9 %   MCV 84.9 80.0 - 100.0 fL   MCH 28.2 26.0 - 34.0 pg   MCHC 33.2 30.0 - 36.0 g/dL   RDW 14.7 82.9 - 56.2 %   Platelets 197 150 - 400 K/uL   nRBC 0.0 0.0 - 0.2 %  Troponin I (High Sensitivity)   Collection Time: 04/13/23  8:11 PM  Result Value Ref Range   Troponin I (High Sensitivity) 4 <18 ng/L  Troponin I (High Sensitivity)   Collection Time: 04/13/23 10:27 PM  Result Value Ref Range   Troponin I (High Sensitivity) 4 <18 ng/L      Assessment & Plan:   Problem List Items Addressed This Visit   None Visit Diagnoses       SOB (shortness of breath)    -  Primary   Patient seen via video visit for SOB- Exam is limited due to nature of visit. Patient has increased oxygen demand, sats are dropping to 93-94% on RA. Unclear why  patient has Oxygen at home. Chart review shows she is on 2L at Bedtime PRN but patient has increased to 3L.  Recommend patient be seen in the ER due to increase oxygen demand. Follow up with PCP on discharge.        Follow up plan: No follow-ups on file.  This visit was completed via MyChart due to the restrictions of the COVID-19 pandemic. All issues as above were discussed and addressed. Physical exam was done as above through visual confirmation on MyChart. If it was felt that the patient should be evaluated in the office, they were directed there. The patient verbally consented to this visit. Location of the patient: Home Location of the provider: Office Those involved with this call:  Provider: Larae Grooms, NP CMA: Artist [place] Front Desk/Registration: Servando Snare This encounter was conducted via video.  I spent 20 dedicated to the care of this patient on the date of this encounter to include previsit review of symptoms, plan of care and follow up, face to face time with the patient, and post visit ordering of testing.

## 2023-04-15 NOTE — Telephone Encounter (Signed)
 Another message

## 2023-04-15 NOTE — ED Triage Notes (Signed)
 Patient to ED via [facility] from home. Patient was diagnosed Tuesday with an upper respiratory infection and possible pneumonia.Today, patient has had increased SOB and CP. Patient says CP is worse when moving. Patient says she has had a productive cough.

## 2023-04-15 NOTE — Telephone Encounter (Signed)
 Chief Complaint: SOB  Disposition: [x] ED  Additional Notes: Pt had a virtual visit today with Larae Grooms, NP, and was advised to increase her O2 and go to ED due to increase oxygen demand. Pt increased the O2 to 3.5 L. Pt states when she gets up to move she still has the SOB. This RN advised pt to go to ED and to call EMS but pt declined. This RN notified [facility] of pt refusal.   Reason for Disposition  SEVERE difficulty breathing (e.g., struggling for each breath, speaks in single words)  Answer Assessment - Initial Assessment Questions 1. RESPIRATORY STATUS: "Describe your breathing?" (e.g., wheezing, shortness of breath, unable to speak, severe coughing)   Shortness of breath  Protocols used: Breathing Difficulty-A-AH

## 2023-04-16 ENCOUNTER — Other Ambulatory Visit: Payer: Self-pay | Admitting: Family Medicine

## 2023-04-16 ENCOUNTER — Ambulatory Visit: Payer: Self-pay | Admitting: Family Medicine

## 2023-04-16 MED ORDER — PROMETHAZINE-DM 6.25-15 MG/5ML PO SYRP
5.0000 mL | ORAL_SOLUTION | Freq: Four times a day (QID) | ORAL | 0 refills | Status: AC | PRN
Start: 2023-04-16 — End: 2023-04-22

## 2023-04-16 MED ORDER — CEFUROXIME AXETIL 250 MG PO TABS
250.0000 mg | ORAL_TABLET | Freq: Once | ORAL | Status: AC
  Administered 2023-04-16: 250 mg via ORAL
  Filled 2023-04-16: qty 1

## 2023-04-16 NOTE — ED Provider Notes (Signed)
-----------------------------------------   12:13 AM on 04/16/2023 ----------------------------------------- Patient care assumed from Dr. Larinda Buttery.  Patient's workup in the emergency department shows overall reassuring results with a normal CBC, reassuring chemistry, negative troponin.  Respiratory panel is negative as well.  Patient CTA of the chest has resulted negative for PE.  Does have an ascending aortic aneurysm measuring 3.9 cm stable from prior image compared to 2022.  Patient informed of the aneurysm and to follow-up with her doctor for ongoing monitoring if needed.  Given the patient's reassuring workup I believe patient is safe for discharge home from the emergency department.   Minna Antis, MD 04/16/23 [phone]

## 2023-04-16 NOTE — Telephone Encounter (Signed)
 Copied from CRM [id number]. Topic: Clinical - Red Word Triage >> Apr 16, 2023 11:44 AM Elle L wrote: Red Word that prompted transfer to Nurse Triage: The patient went to the Emergency Room yesterday as advised and was diagnosed bronchitis. However, she is still experiencing a lot of shortness of breath and difficulty walking due to it.   Chief Complaint: Shortness of breath  Symptoms: Shortness of breath, cough Frequency: Constant  Pertinent Negatives: Patient denies fever Disposition: [] ED /[] Urgent Care (no appt availability in office) / [] Appointment(In office/virtual)/ []  [facility]/ [] Home Care/ [] Refused Recommended Disposition /[] [facility]/ [x]  Follow-up with PCP Additional Notes: Patient reports she has been experiencing some difficulty breathing for the last 3-4 weeks. She states that her symptoms have been worsening and she was seen in the ED last night and diagnosed with bronchitis. She states that she is waiting for a prescription for Ceftin to be filled. Patient states that she will call back on Monday for an appointment if her symptoms are not improving. Patient instructed to call back for new or worsening symptoms. Patient verbalized understanding and agreement with this plan.   Patient is also requesting a work note. She was told in the ED to contact her PCP for a work note. Patient would like a call back with a response to her request for a work note when possible.    Reason for Disposition  Condition / symptoms SAME (not improving)  Answer Assessment - Initial Assessment Questions 1. RESPIRATORY STATUS: "Describe your breathing?" (e.g., wheezing, shortness of breath, unable to speak, severe coughing)      Shortness of breath  2. ONSET: "When did this breathing problem begin?"      3 weeks ago  3. PATTERN "Does the difficult breathing come and go, or has it been constant since it started?"      Constant  4. SEVERITY: "How bad is your breathing?"  (e.g., mild, moderate, severe)    - MILD: No SOB at rest, mild SOB with walking, speaks normally in sentences, can lie down, no retractions, pulse < 100.    - MODERATE: SOB at rest, SOB with minimal exertion and prefers to sit, cannot lie down flat, speaks in phrases, mild retractions, audible wheezing, pulse 100-120.    - SEVERE: Very SOB at rest, speaks in single words, struggling to breathe, sitting hunched forward, retractions, pulse > 120      Moderate to severe  5. RECURRENT SYMPTOM: "Have you had difficulty breathing before?" If Yes, ask: "When was the last time?" and "What happened that time?"      "Not like this" 6. CARDIAC HISTORY: "Do you have any history of heart disease?" (e.g., heart attack, angina, bypass surgery, angioplasty)      Yes 7. LUNG HISTORY: "Do you have any history of lung disease?"  (e.g., pulmonary embolus, asthma, emphysema)     Yes 8. CAUSE: "What do you think is causing the breathing problem?"      Diagnosed with bronchitis in the ED 9. OTHER SYMPTOMS: "Do you have any other symptoms? (e.g., dizziness, runny nose, cough, chest pain, fever)     Cough productive of greenish/yellow sputum  10. O2 SATURATION MONITOR:  "Do you use an oxygen saturation monitor (pulse oximeter) at home?" If Yes, ask: "What is your reading (oxygen level) today?" "What is your usual oxygen saturation reading?" (e.g., 95%)       96%  Answer Assessment - Initial Assessment Questions 1. MAIN CONCERN OR SYMPTOM:  "  What is your main concern right now?" "What question do you have?" "What's the main symptom you're worried about?" (e.g., breathing difficulty, ankle swelling, weight gain.)     Shortness of breath  2. ONSET: "When did the shortness of breath start?"     3-4 weeks ago 3. BETTER-SAME-WORSE: "Are you getting better, staying the same, or getting worse compared to the day you were discharged?"     Same 4. HOSPITALIZATION: "How long were you hospitalized?" (e.g., days)     Seen in  ED 5. DISCHARGE DIAGNOSIS:  "What problem or disease were you hospitalized for?"     Bronchitis  6. DISCHARGE DATE: "What date were you discharged from the hospital?"     04/15/23 7. DISCHARGE DOCTOR: "Who is the main doctor taking care of you now?"     Dr. Lenard Lance 8. DISCHARGE APPOINTMENT: "Have you scheduled a follow-up discharge appointment with your doctor?"     No 9. DISCHARGE MEDICINES: "Did the doctor (or NP/PA) who discharged you order any new medicines for you to use? If yes, have you filled the prescription and started taking the medicine?"      Ceftin  10. PAIN: "Is there any pain?" If Yes, ask: "How bad is it?"  (Scale 0-10; or mild, moderate, severe)   - NONE (0): no pain   - MILD (1-3): doesn't interfere with normal activities   - MODERATE (4-7): interferes with normal activities (e.g., work or school) or awakens from sleep   - SEVERE (8-10): excruciating pain, unable to do any normal activities       Mild 11. FEVER: "Do you have a fever?" If Yes, ask: "What is it, how was it measured  and when did it start?"       No 12. OTHER SYMPTOMS: "Do you have any other symptoms?"       Cough with green/yellow sputum  Protocols used: Breathing Difficulty-A-AH, Post-Hospitalization Follow-up Call-A-AH

## 2023-04-16 NOTE — Telephone Encounter (Signed)
 Continue antibiotic and steroid prescribed from urgent care. Also received request for promethazine DM from [facility], which was sent this afternoon. Use albuterol every six hours. Return to urgent care or ER if sx worsen over the weekend.

## 2023-04-16 NOTE — Progress Notes (Signed)
 Fax from [facility] [place] pt requesting rf promethazine dm since she was prescribed tussionex in the ed and she is allergic to codeine.

## 2023-04-19 ENCOUNTER — Encounter: Payer: Self-pay | Admitting: Internal Medicine

## 2023-04-19 ENCOUNTER — Ambulatory Visit: Admitting: Internal Medicine

## 2023-04-19 ENCOUNTER — Telehealth: Payer: Self-pay | Admitting: Family Medicine

## 2023-04-19 VITALS — BP 118/58 | HR 77 | Ht <= 58 in | Wt 151.2 lb

## 2023-04-19 DIAGNOSIS — J209 Acute bronchitis, unspecified: Secondary | ICD-10-CM | POA: Diagnosis not present

## 2023-04-19 DIAGNOSIS — R0789 Other chest pain: Secondary | ICD-10-CM

## 2023-04-19 DIAGNOSIS — F331 Major depressive disorder, recurrent, moderate: Secondary | ICD-10-CM | POA: Diagnosis not present

## 2023-04-19 DIAGNOSIS — J4540 Moderate persistent asthma, uncomplicated: Secondary | ICD-10-CM | POA: Diagnosis not present

## 2023-04-19 DIAGNOSIS — F4310 Post-traumatic stress disorder, unspecified: Secondary | ICD-10-CM | POA: Diagnosis not present

## 2023-04-19 MED ORDER — QVAR REDIHALER 80 MCG/ACT IN AERB: 1.0000 | INHALATION_SPRAY | Freq: Two times a day (BID) | RESPIRATORY_TRACT | 0 refills | Status: DC
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
## 2023-04-19 NOTE — Telephone Encounter (Signed)
 Copied from CRM [id number]. Topic: Appointments - Appointment Scheduling >> Apr 19, 2023  8:12 AM Joylene Grapes wrote: Patient calling for same day appointment for Hospital Follow Up, was told to be seen by PCP.   Was seen in hospital for increased cough.

## 2023-04-19 NOTE — Telephone Encounter (Signed)
 Patient was advised.

## 2023-04-19 NOTE — Patient Instructions (Signed)
 Acute Bronchitis, Adult  Acute bronchitis is when air tubes in the lungs (bronchi) suddenly get swollen. The condition can make it hard for you to breathe. In adults, acute bronchitis usually goes away within 2 weeks. A cough caused by bronchitis may last up to 3 weeks. Smoking, allergies, and asthma can make the condition worse. What are the causes? Germs that cause cold and flu (viruses). The most common cause of this condition is the virus that causes the common cold. Bacteria. Substances that bother (irritate) the lungs, including: Smoke from cigarettes and other types of tobacco. Dust and pollen. Fumes from chemicals, gases, or burned fuel. Indoor or outdoor air pollution. What increases the risk? A weak body's defense system. This is also called the immune system. Any condition that affects your lungs and breathing, such as asthma. What are the signs or symptoms? A cough. Coughing up clear, yellow, or green mucus. Making high-pitched whistling sounds when you breathe, most often when you breathe out (wheezing). Runny or stuffy nose. Having too much mucus in your lungs (chest congestion). Shortness of breath. Body aches. A sore throat. How is this treated? Acute bronchitis may go away over time without treatment. Your doctor may tell you to: Drink more fluids. This will help thin your mucus so it is easier to cough up. Use a device that gets medicine into your lungs (inhaler). Use a vaporizer or a humidifier. These are machines that add water to the air. This helps with coughing and poor breathing. Take a medicine that thins mucus and helps clear it from your lungs. Take a medicine that prevents or stops coughing. It is not common to take an antibiotic medicine for this condition. Follow these instructions at home:  Take over-the-counter and prescription medicines only as told by your doctor. Use an inhaler, vaporizer, or humidifier as told by your doctor. Take two teaspoons  (10 mL) of honey at bedtime. This helps lessen your coughing at night. Drink enough fluid to keep your pee (urine) pale yellow. Do not smoke or use any products that contain nicotine or tobacco. If you need help quitting, ask your doctor. Get a lot of rest. Return to your normal activities when your doctor says that it is safe. Keep all follow-up visits. How is this prevented?  Wash your hands often with soap and water for at least 20 seconds. If you cannot use soap and water, use hand sanitizer. Avoid contact with people who have cold symptoms. Try not to touch your mouth, nose, or eyes with your hands. Avoid breathing in smoke or chemical fumes. Make sure to get the flu shot every year. Contact a doctor if: Your symptoms do not get better in 2 weeks. You have trouble coughing up the mucus. Your cough keeps you awake at night. You have a fever. Get help right away if: You cough up blood. You have chest pain. You have very bad shortness of breath. You faint or keep feeling like you are going to faint. You have a very bad headache. Your fever or chills get worse. These symptoms may be an emergency. Get help right away. Call your local emergency services (911 in the [place] Do not wait to see if the symptoms will go away. Do not drive yourself to the hospital. Summary Acute bronchitis is when air tubes in the lungs (bronchi) suddenly get swollen. In adults, acute bronchitis usually goes away within 2 weeks. Drink more fluids. This will help thin your mucus so it is easier  to cough up. Take over-the-counter and prescription medicines only as told by your doctor. Contact a doctor if your symptoms do not improve after 2 weeks of treatment. This information is not intended to replace advice given to you by your health care provider. Make sure you discuss any questions you have with your health care provider. Document Revised: 05/22/2020 Document Reviewed: 05/22/2020 Elsevier Patient  Education  2024 [employer].

## 2023-04-19 NOTE — Telephone Encounter (Signed)
 Appt made at [facility] for 11:40 with Nicki Reaper, FNP. Patient informed.

## 2023-04-23 ENCOUNTER — Ambulatory Visit
Admission: EM | Admit: 2023-04-23 | Discharge: 2023-04-23 | Disposition: A | Discharge status: Home / Self Care | Attending: Emergency Medicine | Admitting: Emergency Medicine

## 2023-04-23 ENCOUNTER — Ambulatory Visit (INDEPENDENT_AMBULATORY_CARE_PROVIDER_SITE_OTHER)

## 2023-04-23 ENCOUNTER — Encounter: Payer: Self-pay | Admitting: Emergency Medicine

## 2023-04-23 DIAGNOSIS — M19042 Primary osteoarthritis, left hand: Secondary | ICD-10-CM | POA: Diagnosis not present

## 2023-04-23 DIAGNOSIS — M79642 Pain in left hand: Secondary | ICD-10-CM

## 2023-04-23 DIAGNOSIS — L539 Erythematous condition, unspecified: Secondary | ICD-10-CM | POA: Diagnosis not present

## 2023-04-23 DIAGNOSIS — M799 Soft tissue disorder, unspecified: Secondary | ICD-10-CM | POA: Diagnosis not present

## 2023-04-23 NOTE — Discharge Instructions (Addendum)
 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  No evidence of fracture or dislocation.  You may apply ice to your hand for 20 minutes at a time, 2-3 times a day, as needed for pain or inflammation.  You may take over-the-counter Tylenol according the package instructions as needed for pain and inflammation.  If your symptoms do not improve, or they worsen, either return for reevaluation or follow-up with orthopedics such as [facility] here in [place] or in [place].

## 2023-04-23 NOTE — ED Triage Notes (Signed)
 Patient c/o redness and swelling on top of her left hand that started yesterday.  Patient states that she does wear latex gloves at work.  Patient denies injury or fall.
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
## 2023-05-01 ENCOUNTER — Encounter: Payer: Self-pay | Admitting: Family Medicine

## 2023-05-01 DIAGNOSIS — J329 Chronic sinusitis, unspecified: Secondary | ICD-10-CM

## 2023-05-03 MED ORDER — LEVOCETIRIZINE DIHYDROCHLORIDE 5 MG PO TABS: 5.0000 mg | ORAL_TABLET | Freq: Every evening | ORAL | 2 refills | Status: DC

## 2023-05-04 DIAGNOSIS — H5213 Myopia, bilateral: Secondary | ICD-10-CM | POA: Diagnosis not present

## 2023-05-05 DIAGNOSIS — I361 Nonrheumatic tricuspid (valve) insufficiency: Secondary | ICD-10-CM | POA: Diagnosis not present

## 2023-05-05 DIAGNOSIS — Q248 Other specified congenital malformations of heart: Secondary | ICD-10-CM | POA: Diagnosis not present

## 2023-05-05 DIAGNOSIS — Q211 Atrial septal defect, unspecified: Secondary | ICD-10-CM | POA: Diagnosis not present

## 2023-05-05 DIAGNOSIS — I7121 Aneurysm of the ascending aorta, without rupture: Secondary | ICD-10-CM | POA: Diagnosis not present

## 2023-05-05 DIAGNOSIS — I340 Nonrheumatic mitral (valve) insufficiency: Secondary | ICD-10-CM | POA: Diagnosis not present

## 2023-05-06 DIAGNOSIS — F411 Generalized anxiety disorder: Secondary | ICD-10-CM | POA: Diagnosis not present

## 2023-05-06 DIAGNOSIS — F331 Major depressive disorder, recurrent, moderate: Secondary | ICD-10-CM | POA: Diagnosis not present

## 2023-05-06 DIAGNOSIS — F4310 Post-traumatic stress disorder, unspecified: Secondary | ICD-10-CM | POA: Diagnosis not present

## 2023-05-11 DIAGNOSIS — F331 Major depressive disorder, recurrent, moderate: Secondary | ICD-10-CM | POA: Diagnosis not present

## 2023-05-11 DIAGNOSIS — F411 Generalized anxiety disorder: Secondary | ICD-10-CM | POA: Diagnosis not present

## 2023-05-11 DIAGNOSIS — F5102 Adjustment insomnia: Secondary | ICD-10-CM | POA: Diagnosis not present

## 2023-05-11 DIAGNOSIS — F4310 Post-traumatic stress disorder, unspecified: Secondary | ICD-10-CM | POA: Diagnosis not present

## 2023-05-13 DIAGNOSIS — I340 Nonrheumatic mitral (valve) insufficiency: Secondary | ICD-10-CM | POA: Diagnosis not present

## 2023-05-13 DIAGNOSIS — I361 Nonrheumatic tricuspid (valve) insufficiency: Secondary | ICD-10-CM | POA: Diagnosis not present

## 2023-05-13 DIAGNOSIS — Q248 Other specified congenital malformations of heart: Secondary | ICD-10-CM | POA: Diagnosis not present

## 2023-05-13 DIAGNOSIS — Q211 Atrial septal defect, unspecified: Secondary | ICD-10-CM | POA: Diagnosis not present

## 2023-05-16 ENCOUNTER — Other Ambulatory Visit: Payer: Self-pay | Admitting: Family Medicine

## 2023-05-16 DIAGNOSIS — J349 Unspecified disorder of nose and nasal sinuses: Secondary | ICD-10-CM

## 2023-05-16 DIAGNOSIS — R059 Cough, unspecified: Secondary | ICD-10-CM

## 2023-05-16 DIAGNOSIS — Q121 Congenital displaced lens: Secondary | ICD-10-CM | POA: Diagnosis not present

## 2023-05-16 DIAGNOSIS — R0902 Hypoxemia: Secondary | ICD-10-CM | POA: Diagnosis not present

## 2023-05-16 DIAGNOSIS — J4540 Moderate persistent asthma, uncomplicated: Secondary | ICD-10-CM

## 2023-05-21 DIAGNOSIS — F4310 Post-traumatic stress disorder, unspecified: Secondary | ICD-10-CM | POA: Diagnosis not present

## 2023-05-24 DIAGNOSIS — Q211 Atrial septal defect, unspecified: Secondary | ICD-10-CM | POA: Diagnosis not present

## 2023-05-24 DIAGNOSIS — F331 Major depressive disorder, recurrent, moderate: Secondary | ICD-10-CM | POA: Diagnosis not present

## 2023-05-24 DIAGNOSIS — F411 Generalized anxiety disorder: Secondary | ICD-10-CM | POA: Diagnosis not present

## 2023-05-24 DIAGNOSIS — F4310 Post-traumatic stress disorder, unspecified: Secondary | ICD-10-CM | POA: Diagnosis not present

## 2023-05-25 DIAGNOSIS — Z8719 Personal history of other diseases of the digestive system: Secondary | ICD-10-CM | POA: Diagnosis not present

## 2023-05-25 DIAGNOSIS — K581 Irritable bowel syndrome with constipation: Secondary | ICD-10-CM | POA: Diagnosis not present

## 2023-05-25 DIAGNOSIS — K5790 Diverticulosis of intestine, part unspecified, without perforation or abscess without bleeding: Secondary | ICD-10-CM | POA: Diagnosis not present

## 2023-05-26 ENCOUNTER — Other Ambulatory Visit: Payer: Self-pay | Admitting: Family Medicine

## 2023-05-26 DIAGNOSIS — J302 Other seasonal allergic rhinitis: Secondary | ICD-10-CM

## 2023-05-26 NOTE — Telephone Encounter (Signed)
 Requested Prescriptions  Pending Prescriptions Disp Refills   montelukast  (SINGULAIR ) 10 MG tablet [Pharmacy Med Name: MONTELUKAST  SOD 10 MG TABLET] 90 tablet 0    Sig: TAKE 1 TABLET BY MOUTH EVERYDAY AT BEDTIME     Pulmonology:  Leukotriene Inhibitors Passed - 05/26/2023  2:32 PM      Passed - Valid encounter within last 12 months    Recent Outpatient Visits           1 month ago Acute bronchitis, unspecified organism   [facility] [place], Rankin Buzzard, NP   1 month ago SOB (shortness of breath)   [facility] Aileen Alexanders, NP       Future Appointments             In 1 month Fisher, Erlinda Haws, MD [facility]

## 2023-06-03 DIAGNOSIS — F331 Major depressive disorder, recurrent, moderate: Secondary | ICD-10-CM | POA: Diagnosis not present

## 2023-06-03 DIAGNOSIS — F4310 Post-traumatic stress disorder, unspecified: Secondary | ICD-10-CM | POA: Diagnosis not present

## 2023-06-03 DIAGNOSIS — F411 Generalized anxiety disorder: Secondary | ICD-10-CM | POA: Diagnosis not present

## 2023-06-08 DIAGNOSIS — F4310 Post-traumatic stress disorder, unspecified: Secondary | ICD-10-CM | POA: Diagnosis not present

## 2023-06-08 DIAGNOSIS — F331 Major depressive disorder, recurrent, moderate: Secondary | ICD-10-CM | POA: Diagnosis not present

## 2023-06-08 DIAGNOSIS — F411 Generalized anxiety disorder: Secondary | ICD-10-CM | POA: Diagnosis not present

## 2023-06-08 DIAGNOSIS — F5102 Adjustment insomnia: Secondary | ICD-10-CM | POA: Diagnosis not present

## 2023-06-13 ENCOUNTER — Other Ambulatory Visit: Payer: Self-pay | Admitting: Family Medicine

## 2023-06-13 ENCOUNTER — Encounter: Payer: Self-pay | Admitting: Family Medicine

## 2023-06-13 DIAGNOSIS — J302 Other seasonal allergic rhinitis: Secondary | ICD-10-CM

## 2023-06-14 ENCOUNTER — Other Ambulatory Visit: Payer: Self-pay

## 2023-06-14 MED ORDER — ACCU-CHEK GUIDE TEST VI STRP: ORAL_STRIP | 12 refills | Status: DC

## 2023-06-14 MED ORDER — MONTELUKAST SODIUM 10 MG PO TABS: ORAL_TABLET | ORAL | 0 refills | Status: DC

## 2023-06-14 MED ORDER — ACCU-CHEK SOFTCLIX LANCETS MISC: 4 refills | Status: AC

## 2023-06-14 MED ORDER — ACCU-CHEK GUIDE TEST VI STRP: ORAL_STRIP | 12 refills | Status: AC

## 2023-06-14 NOTE — Addendum Note (Signed)
 Addended by: Lamon Pillow on: 06/14/2023 01:08 PM   Modules accepted: Orders

## 2023-06-15 DIAGNOSIS — Q121 Congenital displaced lens: Secondary | ICD-10-CM | POA: Diagnosis not present

## 2023-06-15 DIAGNOSIS — R0902 Hypoxemia: Secondary | ICD-10-CM | POA: Diagnosis not present

## 2023-06-16 DIAGNOSIS — F4310 Post-traumatic stress disorder, unspecified: Secondary | ICD-10-CM | POA: Diagnosis not present

## 2023-06-16 DIAGNOSIS — F411 Generalized anxiety disorder: Secondary | ICD-10-CM | POA: Diagnosis not present

## 2023-06-16 DIAGNOSIS — F331 Major depressive disorder, recurrent, moderate: Secondary | ICD-10-CM | POA: Diagnosis not present

## 2023-06-18 ENCOUNTER — Other Ambulatory Visit: Payer: Self-pay | Admitting: Family Medicine

## 2023-06-18 DIAGNOSIS — J301 Allergic rhinitis due to pollen: Secondary | ICD-10-CM

## 2023-07-06 DIAGNOSIS — F331 Major depressive disorder, recurrent, moderate: Secondary | ICD-10-CM | POA: Diagnosis not present

## 2023-07-06 DIAGNOSIS — F4310 Post-traumatic stress disorder, unspecified: Secondary | ICD-10-CM | POA: Diagnosis not present

## 2023-07-06 DIAGNOSIS — F5102 Adjustment insomnia: Secondary | ICD-10-CM | POA: Diagnosis not present

## 2023-07-06 DIAGNOSIS — F411 Generalized anxiety disorder: Secondary | ICD-10-CM | POA: Diagnosis not present

## 2023-07-16 ENCOUNTER — Ambulatory Visit (INDEPENDENT_AMBULATORY_CARE_PROVIDER_SITE_OTHER): Admitting: Family Medicine

## 2023-07-16 ENCOUNTER — Encounter: Payer: Self-pay | Admitting: Family Medicine

## 2023-07-16 ENCOUNTER — Other Ambulatory Visit (HOSPITAL_COMMUNITY)
Admission: RE | Admit: 2023-07-16 | Discharge: 2023-07-16 | Disposition: A | Discharge status: Home / Self Care | Source: Ambulatory Visit | Attending: Family Medicine | Admitting: Family Medicine

## 2023-07-16 VITALS — BP 99/49 | HR 73 | Ht <= 58 in | Wt 145.3 lb

## 2023-07-16 DIAGNOSIS — R87612 Low grade squamous intraepithelial lesion on cytologic smear of cervix (LGSIL): Secondary | ICD-10-CM

## 2023-07-16 DIAGNOSIS — F321 Major depressive disorder, single episode, moderate: Secondary | ICD-10-CM | POA: Diagnosis not present

## 2023-07-16 DIAGNOSIS — R5383 Other fatigue: Secondary | ICD-10-CM

## 2023-07-16 DIAGNOSIS — Z124 Encounter for screening for malignant neoplasm of cervix: Secondary | ICD-10-CM | POA: Insufficient documentation

## 2023-07-16 DIAGNOSIS — E559 Vitamin D deficiency, unspecified: Secondary | ICD-10-CM | POA: Diagnosis not present

## 2023-07-16 DIAGNOSIS — R0902 Hypoxemia: Secondary | ICD-10-CM | POA: Diagnosis not present

## 2023-07-16 DIAGNOSIS — Z0000 Encounter for general adult medical examination without abnormal findings: Secondary | ICD-10-CM

## 2023-07-16 DIAGNOSIS — N898 Other specified noninflammatory disorders of vagina: Secondary | ICD-10-CM | POA: Diagnosis not present

## 2023-07-16 DIAGNOSIS — I340 Nonrheumatic mitral (valve) insufficiency: Secondary | ICD-10-CM

## 2023-07-16 DIAGNOSIS — Q121 Congenital displaced lens: Secondary | ICD-10-CM | POA: Diagnosis not present

## 2023-07-16 MED ORDER — QVAR REDIHALER 80 MCG/ACT IN AERB: 1.0000 | INHALATION_SPRAY | Freq: Two times a day (BID) | RESPIRATORY_TRACT | 3 refills | Status: DC
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
## 2023-07-19 DIAGNOSIS — N951 Menopausal and female climacteric states: Secondary | ICD-10-CM | POA: Diagnosis not present

## 2023-07-19 DIAGNOSIS — I340 Nonrheumatic mitral (valve) insufficiency: Secondary | ICD-10-CM | POA: Diagnosis not present

## 2023-07-19 DIAGNOSIS — R5383 Other fatigue: Secondary | ICD-10-CM | POA: Diagnosis not present

## 2023-07-19 DIAGNOSIS — E559 Vitamin D deficiency, unspecified: Secondary | ICD-10-CM | POA: Diagnosis not present

## 2023-07-19 DIAGNOSIS — F321 Major depressive disorder, single episode, moderate: Secondary | ICD-10-CM | POA: Diagnosis not present

## 2023-07-20 DIAGNOSIS — F4310 Post-traumatic stress disorder, unspecified: Secondary | ICD-10-CM | POA: Diagnosis not present

## 2023-07-20 DIAGNOSIS — F411 Generalized anxiety disorder: Secondary | ICD-10-CM | POA: Diagnosis not present

## 2023-07-20 DIAGNOSIS — F331 Major depressive disorder, recurrent, moderate: Secondary | ICD-10-CM | POA: Diagnosis not present

## 2023-07-20 LAB — LIPID PANEL
Chol/HDL Ratio: 2.8 ratio (ref 0.0–4.4)
Cholesterol, Total: 162 mg/dL (ref 100–199)
HDL: 57 mg/dL (ref >39)
LDL Chol Calc (NIH): 92 mg/dL (ref 0–99)
Triglycerides: 67 mg/dL (ref 0–149)
VLDL Cholesterol Cal: 13 mg/dL (ref 5–40)

## 2023-07-20 LAB — CBC
Hematocrit: 41.9 % (ref 34.0–46.6)
Hemoglobin: 13.4 g/dL (ref 11.1–15.9)
MCH: 27.5 pg (ref 26.6–33.0)
MCHC: 32 g/dL (ref 31.5–35.7)
MCV: 86 fL (ref 79–97)
Platelets: 166 10*3/uL (ref 150–450)
RBC: 4.88 x10E6/uL (ref 3.77–5.28)
RDW: 13.7 % (ref 11.7–15.4)
WBC: 4.4 10*3/uL (ref 3.4–10.8)

## 2023-07-20 LAB — VITAMIN D 25 HYDROXY (VIT D DEFICIENCY, FRACTURES): Vit D, 25-Hydroxy: 130 ng/mL — ABNORMAL HIGH (ref 30.0–100.0)

## 2023-07-20 LAB — T4 AND TSH
T4, Total: 5.9 ug/dL (ref 4.5–12.0)
TSH: 2.61 u[IU]/mL (ref 0.450–4.500)

## 2023-07-20 LAB — VITAMIN B12: Vitamin B-12: 813 pg/mL (ref 232–1245)

## 2023-07-22 LAB — CYTOLOGY - PAP
Chlamydia: NEGATIVE
Comment: NEGATIVE
Comment: NEGATIVE
Comment: NORMAL
Diagnosis: NEGATIVE
Diagnosis: REACTIVE
High risk HPV: POSITIVE — AB
Neisseria Gonorrhea: NEGATIVE

## 2023-07-23 ENCOUNTER — Ambulatory Visit: Payer: Self-pay | Admitting: Family Medicine

## 2023-07-23 DIAGNOSIS — R87618 Other abnormal cytological findings on specimens from cervix uteri: Secondary | ICD-10-CM

## 2023-07-31 ENCOUNTER — Other Ambulatory Visit: Payer: Self-pay | Admitting: Family Medicine

## 2023-07-31 DIAGNOSIS — J329 Chronic sinusitis, unspecified: Secondary | ICD-10-CM

## 2023-08-03 DIAGNOSIS — F411 Generalized anxiety disorder: Secondary | ICD-10-CM | POA: Diagnosis not present

## 2023-08-03 DIAGNOSIS — F4310 Post-traumatic stress disorder, unspecified: Secondary | ICD-10-CM | POA: Diagnosis not present

## 2023-08-03 DIAGNOSIS — F5102 Adjustment insomnia: Secondary | ICD-10-CM | POA: Diagnosis not present

## 2023-08-03 DIAGNOSIS — F331 Major depressive disorder, recurrent, moderate: Secondary | ICD-10-CM | POA: Diagnosis not present

## 2023-08-10 DIAGNOSIS — F331 Major depressive disorder, recurrent, moderate: Secondary | ICD-10-CM | POA: Diagnosis not present

## 2023-08-10 DIAGNOSIS — F5102 Adjustment insomnia: Secondary | ICD-10-CM | POA: Diagnosis not present

## 2023-08-10 DIAGNOSIS — F4310 Post-traumatic stress disorder, unspecified: Secondary | ICD-10-CM | POA: Diagnosis not present

## 2023-08-10 DIAGNOSIS — F411 Generalized anxiety disorder: Secondary | ICD-10-CM | POA: Diagnosis not present
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
## 2023-08-13 ENCOUNTER — Other Ambulatory Visit (HOSPITAL_COMMUNITY)
Admission: RE | Admit: 2023-08-13 | Discharge: 2023-08-13 | Disposition: A | Discharge status: Home / Self Care | Source: Ambulatory Visit | Attending: Obstetrics | Admitting: Obstetrics

## 2023-08-13 ENCOUNTER — Ambulatory Visit (INDEPENDENT_AMBULATORY_CARE_PROVIDER_SITE_OTHER): Admitting: Obstetrics

## 2023-08-13 ENCOUNTER — Encounter: Payer: Self-pay | Admitting: Obstetrics

## 2023-08-13 VITALS — BP 96/48 | HR 84 | Ht <= 58 in | Wt 143.0 lb

## 2023-08-13 DIAGNOSIS — N898 Other specified noninflammatory disorders of vagina: Secondary | ICD-10-CM

## 2023-08-13 DIAGNOSIS — B977 Papillomavirus as the cause of diseases classified elsewhere: Secondary | ICD-10-CM | POA: Insufficient documentation

## 2023-08-13 DIAGNOSIS — N888 Other specified noninflammatory disorders of cervix uteri: Secondary | ICD-10-CM | POA: Diagnosis not present

## 2023-08-13 NOTE — Addendum Note (Signed)
 Addended by: Vanessa Alesi D on: 08/13/2023 10:12 AM   Modules accepted: Orders

## 2023-08-15 DIAGNOSIS — Q121 Congenital displaced lens: Secondary | ICD-10-CM | POA: Diagnosis not present

## 2023-08-15 DIAGNOSIS — R0902 Hypoxemia: Secondary | ICD-10-CM | POA: Diagnosis not present

## 2023-08-16 ENCOUNTER — Ambulatory Visit: Payer: Self-pay | Admitting: Obstetrics

## 2023-08-16 LAB — SURGICAL PATHOLOGY

## 2023-08-17 DIAGNOSIS — F5102 Adjustment insomnia: Secondary | ICD-10-CM | POA: Diagnosis not present

## 2023-08-17 DIAGNOSIS — F411 Generalized anxiety disorder: Secondary | ICD-10-CM | POA: Diagnosis not present

## 2023-08-17 DIAGNOSIS — F4310 Post-traumatic stress disorder, unspecified: Secondary | ICD-10-CM | POA: Diagnosis not present

## 2023-08-17 DIAGNOSIS — F331 Major depressive disorder, recurrent, moderate: Secondary | ICD-10-CM | POA: Diagnosis not present

## 2023-08-17 LAB — CERVICOVAGINAL ANCILLARY ONLY
Bacterial Vaginitis (gardnerella): NEGATIVE
Candida Glabrata: NEGATIVE
Candida Vaginitis: NEGATIVE
Chlamydia: NEGATIVE
Comment: NEGATIVE
Comment: NEGATIVE
Comment: NEGATIVE
Comment: NEGATIVE
Comment: NEGATIVE
Comment: NORMAL
Neisseria Gonorrhea: NEGATIVE
Trichomonas: NEGATIVE

## 2023-08-19 DIAGNOSIS — F4310 Post-traumatic stress disorder, unspecified: Secondary | ICD-10-CM | POA: Diagnosis not present

## 2023-08-19 DIAGNOSIS — F411 Generalized anxiety disorder: Secondary | ICD-10-CM | POA: Diagnosis not present

## 2023-08-23 DIAGNOSIS — R051 Acute cough: Secondary | ICD-10-CM | POA: Diagnosis not present

## 2023-08-23 DIAGNOSIS — Z03818 Encounter for observation for suspected exposure to other biological agents ruled out: Secondary | ICD-10-CM | POA: Diagnosis not present

## 2023-08-23 DIAGNOSIS — F331 Major depressive disorder, recurrent, moderate: Secondary | ICD-10-CM | POA: Diagnosis not present

## 2023-08-23 DIAGNOSIS — F411 Generalized anxiety disorder: Secondary | ICD-10-CM | POA: Diagnosis not present

## 2023-08-23 DIAGNOSIS — F4310 Post-traumatic stress disorder, unspecified: Secondary | ICD-10-CM | POA: Diagnosis not present

## 2023-08-23 DIAGNOSIS — J028 Acute pharyngitis due to other specified organisms: Secondary | ICD-10-CM | POA: Diagnosis not present

## 2023-08-23 DIAGNOSIS — F5102 Adjustment insomnia: Secondary | ICD-10-CM | POA: Diagnosis not present

## 2023-08-23 DIAGNOSIS — J019 Acute sinusitis, unspecified: Secondary | ICD-10-CM | POA: Diagnosis not present

## 2023-08-25 DIAGNOSIS — K581 Irritable bowel syndrome with constipation: Secondary | ICD-10-CM | POA: Diagnosis not present

## 2023-08-25 DIAGNOSIS — K5790 Diverticulosis of intestine, part unspecified, without perforation or abscess without bleeding: Secondary | ICD-10-CM | POA: Diagnosis not present

## 2023-08-25 DIAGNOSIS — Z8719 Personal history of other diseases of the digestive system: Secondary | ICD-10-CM | POA: Diagnosis not present

## 2023-08-27 DIAGNOSIS — F5102 Adjustment insomnia: Secondary | ICD-10-CM | POA: Diagnosis not present

## 2023-08-27 DIAGNOSIS — F331 Major depressive disorder, recurrent, moderate: Secondary | ICD-10-CM | POA: Diagnosis not present

## 2023-08-27 DIAGNOSIS — F411 Generalized anxiety disorder: Secondary | ICD-10-CM | POA: Diagnosis not present

## 2023-08-27 DIAGNOSIS — F4310 Post-traumatic stress disorder, unspecified: Secondary | ICD-10-CM | POA: Diagnosis not present

## 2023-09-01 ENCOUNTER — Telehealth: Payer: Self-pay | Admitting: Family Medicine

## 2023-09-01 NOTE — Telephone Encounter (Signed)
 Needs office visit.

## 2023-09-01 NOTE — Telephone Encounter (Signed)
 Copied from CRM [id number]. Topic: General - Other >> Sep 01, 2023  2:41 PM Zebedee SAUNDERS wrote: Reason for CRM: Pt would like someone to call her for an appt for acute care or send antibiotics to pharmacy for coughing. She did go to urgent care they gave her antibiotics but pt states she is still coughing. Please call pt at [phone]

## 2023-09-01 NOTE — Telephone Encounter (Signed)
 Scheduled pt acute with [facility], sent to provider for [facility]

## 2023-09-02 ENCOUNTER — Telehealth: Payer: Self-pay

## 2023-09-02 ENCOUNTER — Encounter: Payer: Self-pay | Admitting: Family Medicine

## 2023-09-02 ENCOUNTER — Other Ambulatory Visit: Payer: Self-pay | Admitting: Family Medicine

## 2023-09-02 ENCOUNTER — Ambulatory Visit: Admitting: Family Medicine

## 2023-09-02 VITALS — BP 116/70 | HR 94 | Resp 18 | Ht <= 58 in | Wt 144.4 lb

## 2023-09-02 DIAGNOSIS — R052 Subacute cough: Secondary | ICD-10-CM

## 2023-09-02 DIAGNOSIS — F4310 Post-traumatic stress disorder, unspecified: Secondary | ICD-10-CM | POA: Diagnosis not present

## 2023-09-02 MED ORDER — HYDROCOD POLI-CHLORPHE POLI ER 10-8 MG/5ML PO SUER: 5.0000 mL | Freq: Every evening | ORAL | 0 refills | Status: DC | PRN

## 2023-09-02 MED ORDER — PROMETHAZINE-DM 6.25-15 MG/5ML PO SYRP: 2.5000 mL | ORAL_SOLUTION | Freq: Four times a day (QID) | ORAL | 0 refills | Status: DC | PRN

## 2023-09-02 MED ORDER — AZITHROMYCIN 250 MG PO TABS: ORAL_TABLET | ORAL | 0 refills | Status: AC
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
## 2023-09-02 NOTE — Telephone Encounter (Signed)
 Copied from CRM [id number]. Topic: Clinical - Medication Question >> Sep 02, 2023  2:41 PM Marissa P wrote: Reason for CRM: Patient is calling because the original medication that she was put on is not covered by her insurance and was recommended to try promethazine  dm, please advise and get back to the patient today if possible.

## 2023-09-03 ENCOUNTER — Encounter: Payer: Self-pay | Admitting: Family Medicine

## 2023-09-09 DIAGNOSIS — F331 Major depressive disorder, recurrent, moderate: Secondary | ICD-10-CM | POA: Diagnosis not present

## 2023-09-09 DIAGNOSIS — F4310 Post-traumatic stress disorder, unspecified: Secondary | ICD-10-CM | POA: Diagnosis not present

## 2023-09-09 DIAGNOSIS — F411 Generalized anxiety disorder: Secondary | ICD-10-CM | POA: Diagnosis not present

## 2023-09-09 DIAGNOSIS — F5102 Adjustment insomnia: Secondary | ICD-10-CM | POA: Diagnosis not present

## 2023-09-13 ENCOUNTER — Encounter: Payer: Self-pay | Admitting: Family Medicine

## 2023-09-13 ENCOUNTER — Ambulatory Visit: Admitting: Family Medicine

## 2023-09-13 ENCOUNTER — Ambulatory Visit
Admission: RE | Admit: 2023-09-13 | Discharge: 2023-09-13 | Disposition: A | Discharge status: Home / Self Care | Source: Ambulatory Visit | Attending: Family Medicine | Admitting: Family Medicine

## 2023-09-13 VITALS — BP 95/58 | HR 75 | Temp 98.0°F | Ht <= 58 in | Wt 143.6 lb

## 2023-09-13 DIAGNOSIS — R052 Subacute cough: Secondary | ICD-10-CM

## 2023-09-13 DIAGNOSIS — R112 Nausea with vomiting, unspecified: Secondary | ICD-10-CM | POA: Diagnosis not present

## 2023-09-13 DIAGNOSIS — R918 Other nonspecific abnormal finding of lung field: Secondary | ICD-10-CM

## 2023-09-13 DIAGNOSIS — J984 Other disorders of lung: Secondary | ICD-10-CM | POA: Diagnosis not present

## 2023-09-13 MED ORDER — PREDNISONE 10 MG PO TABS: ORAL_TABLET | ORAL | 0 refills | Status: AC

## 2023-09-13 MED ORDER — LEVOFLOXACIN 500 MG PO TABS: 500.0000 mg | ORAL_TABLET | Freq: Every day | ORAL | 0 refills | Status: DC

## 2023-09-13 MED ORDER — ONDANSETRON 4 MG PO TBDP: 4.0000 mg | ORAL_TABLET | Freq: Four times a day (QID) | ORAL | 0 refills | Status: DC | PRN

## 2023-09-13 NOTE — Patient Instructions (Signed)
 Please review the attached list of medications and notify my office if there are any errors.   Go to [facility] cabin [facility]) Imaging at [facility] for your Xrays (phone no. [phone])

## 2023-09-13 NOTE — Progress Notes (Signed)
 Established patient visit   Patient: Erin Richards   DOB: Apr 29, 1965   58 y.o. Female  MRN: [id number] Visit Date: 09/13/2023  Today's healthcare provider: Nancyann Perry, MD   Chief Complaint  Patient presents with   Cough    Patient is here today due to a cough, she stated that it has been going on for about a month.  States that she is using all three of her inhalers but she is still getting short of breath and being real tired.  States that it is a productive cough at times and when she does it takes a lot out of her to get the phlegm up.  Reports that the phlegm is green in color and really thick.  Been to doctors and stated that they told her to follow up with PCP as she may need to see a lung doctor.   Subjective    Discussed the use of AI scribe software for clinical note transcription with the patient, who gave verbal consent to proceed.  History of Present Illness           Medications: Outpatient Medications Prior to Visit  Medication Sig   Accu-Chek Softclix Lancets lancets Use to check blood sugar daily   ALPRAZolam  (XANAX ) 0.5 MG tablet Take 0.5-1 tablets (0.25-0.5 mg total) by mouth 2 (two) times daily as needed (pain attacks).   aspirin EC 81 MG tablet Take by mouth.   Azelastine  HCl 137 MCG/SPRAY SOLN PLACE 1 SPRAY INTO BOTH NOSTRILS 2 (TWO) TIMES DAILY   beclomethasone (QVAR  REDIHALER) 80 MCG/ACT inhaler Inhale 1 puff into the lungs 2 (two) times daily.   Blood Glucose Monitoring Suppl (ACCU-CHEK GUIDE) w/Device KIT Use daily to check blood sugars   buPROPion (WELLBUTRIN SR) 150 MG 12 hr tablet Take 150 mg by mouth every morning.   clonazePAM (KLONOPIN) 0.5 MG tablet Take 0.5 mg by mouth daily as needed.   escitalopram  (LEXAPRO ) 10 MG tablet Take 10 mg by mouth daily.   estradiol  (ESTRACE ) 0.5 MG tablet TAKE 1 TABLET BY MOUTH EVERY DAY   fluticasone  (FLONASE ) 50 MCG/ACT nasal spray PLACE 2 SPRAYS INTO BOTH NOSTRILS DAILY AS NEEDED FOR ALLERGIES OR RHINITIS.    glucose blood (ACCU-CHEK GUIDE TEST) test strip Use as instructed to check blood sugar daily   hydrocortisone  (ANUSOL -HC) 25 MG suppository Place 1 suppository (25 mg total) rectally 2 (two) times daily.   hyoscyamine  (LEVSIN ) 0.125 MG tablet TAKE ONE TABLET BY MOUTH EVERY 6 HOURS AS NEEDED FOR STOMACH CRAMPINGS   ipratropium (ATROVENT ) 0.06 % nasal spray Place 2 sprays into both nostrils 4 (four) times daily.   Ipratropium-Albuterol  (COMBIVENT  RESPIMAT) 20-100 MCG/ACT AERS respimat INHALE 1 PUFF INTO THE LUNGS EVERY 4 HOURS AS NEEDED FOR WHEEZING.   levocetirizine (XYZAL ) 5 MG tablet TAKE 1 TABLET BY MOUTH EVERY DAY IN THE EVENING   linaclotide  (LINZESS ) 145 MCG CAPS capsule Take 1 capsule (145 mcg total) by mouth daily before breakfast.   methocarbamol  (ROBAXIN ) 500 MG tablet Take 1 tablet (500 mg total) by mouth every 8 (eight) hours as needed for muscle spasms.   MISCELLANEOUS VAGINAL PRODUCTS VA Place vaginally.   montelukast  (SINGULAIR ) 10 MG tablet TAKE 1 TABLET BY MOUTH EVERYDAY AT BEDTIME   Multiple Vitamin (MULTIVITAMIN WITH MINERALS) TABS tablet Take 2 tablets by mouth daily.   Multiple Vitamins-Minerals (VITAFUSION MULTI WOMENS PO) Take by mouth.   naproxen  (NAPROSYN ) 500 MG tablet TAKE 1 TABLET (500 MG TOTAL) BY MOUTH  2 (TWO) TIMES DAILY WITH A MEAL. AS NEEDED FOR PAIN   nortriptyline  (PAMELOR ) 25 MG capsule TAKE ONE CAPSULE AT BEDTIME FOR MIGRANE PREVENTION AND IRRITABLE BOWEL SYNDROME   Olopatadine  HCl 0.2 % SOLN Apply 1 drop to eye daily.   OXYGEN  Inhale 2 L into the lungs at bedtime as needed.   pantoprazole  (PROTONIX ) 40 MG tablet TAKE 1 TABLET (40 MG TOTAL) BY MOUTH DAILY. FOR ACID REFLUX   Probiotic Product (PROBIOTIC DAILY PO) Take by mouth.   promethazine -dextromethorphan (PROMETHAZINE -DM) 6.25-15 MG/5ML syrup Take 2.5 mLs by mouth 4 (four) times daily as needed for cough.   RYALTRIS 665-25 MCG/ACT SUSP Place into both nostrils.   Spacer/Aero-Holding Chambers (AEROCHAMBER  PLUS) inhaler Use with inhaler   UBRELVY  50 MG TABS TAKE 50 MG BY MOUTH ONCE AS NEEDED FOR UP TO 1 DOSE (MIGRAINES, HEADACHES).   VENTOLIN  HFA 108 (90 Base) MCG/ACT inhaler Inhale 2 puffs into the lungs every 6 (six) hours as needed for wheezing or shortness of breath.   zolpidem  (AMBIEN ) 10 MG tablet TAKE 1 TABLET BY MOUTH EVERYDAY AT BEDTIME   bacitracin  ointment Apply 1 Application topically 2 (two) times daily. (Patient not taking: Reported on 09/13/2023)   clotrimazole -betamethasone  (LOTRISONE ) cream Apply to affected area 2 times daily prn (Patient not taking: Reported on 09/13/2023)   promethazine  (PHENERGAN ) 12.5 MG tablet Take 1 tablet (12.5 mg total) by mouth every 8 (eight) hours as needed for nausea or vomiting. (Patient not taking: Reported on 09/13/2023)   VITAMIN D -VITAMIN K PO Take by mouth. (Patient not taking: Reported on 09/13/2023)   [DISCONTINUED] ondansetron  (ZOFRAN -ODT) 4 MG disintegrating tablet Take 1 tablet (4 mg total) by mouth every 6 (six) hours as needed for nausea or vomiting. (Patient not taking: Reported on 09/13/2023)   No facility-administered medications prior to visit.        Objective    BP (!) 95/58 (BP Location: Left Arm, Patient Position: Sitting, Cuff Size: Normal)   Pulse 75   Temp 98 F (36.7 C) (Oral)   Ht 4' 9 (1.448 m)   Wt 143 lb 9.6 oz (65.1 kg)   SpO2 96%   BMI 31.07 kg/m   Physical Exam   General: Appearance:    Mildly obese female in no acute distress. Coughing frequently throughout visit.   Eyes:    PERRL, conjunctiva/corneas clear, EOM's intact       Lungs:    Left lung rales, respirations unlabored  Heart:    Normal heart rate. Normal rhythm.  2/6 blowing, holosystolic murmur at apex 2/6 high pitched, blowing, decrescendo, diastolic murmur at the left third intercostal space and lower sternal border   MS:   All extremities are intact.    Neurologic:   Awake, alert, oriented x 3. No apparent focal neurological defect.           Assessment & Plan    1. Subacute cough (Primary) Exam suspicious for pneumonia. Failed cefdinir  and azithromycin .   - DG Chest 2 View; Future  She has nausea and vomiting with many antibiotic, but state she is usually able to tolerate them if she takes ondansetron  before taking the antibiotic.   - levofloxacin  (LEVAQUIN ) 500 MG tablet; Take 1 tablet (500 mg total) by mouth daily for 7 days.  Dispense: 7 tablet; Refill: 0 - predniSONE  (DELTASONE ) 10 MG tablet; 6 tablets for 1 day, then 5 for 1 day, then 4 for 1 day, then 3 for 1 day, then 2 for 1 day  then 1 for 1 day.  Dispense: 21 tablet; Refill: 0  2. Lung field abnormal finding on examination Concerning for pneumonia as above.   3. Nausea and vomiting, unspecified vomiting type  refill ondansetron  (ZOFRAN -ODT) 4 MG disintegrating tablet; Take 1 tablet (4 mg total) by mouth every 6 (six) hours as needed for nausea or vomiting.  Dispense: 20 tablet; Refill: 0      Nancyann Perry, MD  [facility] [phone] (phone) [phone] (fax)  [facility]

## 2023-09-14 ENCOUNTER — Telehealth: Payer: Self-pay

## 2023-09-14 DIAGNOSIS — F4310 Post-traumatic stress disorder, unspecified: Secondary | ICD-10-CM | POA: Diagnosis not present

## 2023-09-14 NOTE — Telephone Encounter (Signed)
 Copied from CRM [id number]. Topic: Clinical - Lab/Test Results >> Sep 14, 2023  3:52 PM Erin Richards wrote: Reason for CRM:   Patient checking in on Xray results from yesterday, don't show final results yet. Please follow up with patient once in. Concerned about pneumonia.

## 2023-09-15 ENCOUNTER — Telehealth: Payer: Self-pay

## 2023-09-15 ENCOUNTER — Other Ambulatory Visit: Payer: Self-pay | Admitting: Family Medicine

## 2023-09-15 ENCOUNTER — Ambulatory Visit: Payer: Self-pay | Admitting: Family Medicine

## 2023-09-15 DIAGNOSIS — J301 Allergic rhinitis due to pollen: Secondary | ICD-10-CM

## 2023-09-15 DIAGNOSIS — Q121 Congenital displaced lens: Secondary | ICD-10-CM | POA: Diagnosis not present

## 2023-09-15 DIAGNOSIS — R0902 Hypoxemia: Secondary | ICD-10-CM | POA: Diagnosis not present

## 2023-09-15 NOTE — Telephone Encounter (Signed)
 Called [facility] and spoke with Sheryl. Sheryl is going to push the report through to STAT for the radiologist to read.

## 2023-09-15 NOTE — Progress Notes (Signed)
 Called patient to inform her of the results, explained what the message stated and she verbalized understanding.  She said she would call us  by the end of the week if not feeling any better.  However she did say she was feeling a little better.

## 2023-09-15 NOTE — Telephone Encounter (Signed)
 Please contact [facility] and see when her chest XR is going to be read

## 2023-09-15 NOTE — Telephone Encounter (Signed)
 Awaiting results from CXR   Copied from CRM #[id number]. Topic: Clinical - Lab/Test Results >> Sep 14, 2023  3:52 PM Sophia H wrote: Reason for CRM:   Patient checking in on Xray results from yesterday, don't show final results yet. Please follow up with patient once in. Concerned about pneumonia. >> Sep 14, 2023  4:58 PM Drema MATSU wrote: Patient is returning a call from clinic. Please call patient back.

## 2023-09-17 ENCOUNTER — Ambulatory Visit: Payer: Self-pay

## 2023-09-17 ENCOUNTER — Telehealth: Payer: Self-pay | Admitting: Family Medicine

## 2023-09-17 DIAGNOSIS — J302 Other seasonal allergic rhinitis: Secondary | ICD-10-CM

## 2023-09-17 DIAGNOSIS — Z79890 Hormone replacement therapy (postmenopausal): Secondary | ICD-10-CM

## 2023-09-17 DIAGNOSIS — J301 Allergic rhinitis due to pollen: Secondary | ICD-10-CM

## 2023-09-17 DIAGNOSIS — R052 Subacute cough: Secondary | ICD-10-CM

## 2023-09-17 DIAGNOSIS — N951 Menopausal and female climacteric states: Secondary | ICD-10-CM

## 2023-09-17 MED ORDER — IPRATROPIUM BROMIDE 0.06 % NA SOLN: 2.0000 | Freq: Four times a day (QID) | NASAL | 12 refills | Status: DC

## 2023-09-17 MED ORDER — ESTRADIOL 0.5 MG PO TABS: 0.5000 mg | ORAL_TABLET | Freq: Every day | ORAL | 1 refills | Status: DC

## 2023-09-17 MED ORDER — FLUTICASONE PROPIONATE 50 MCG/ACT NA SUSP: 2.0000 | Freq: Every day | NASAL | 1 refills | Status: AC | PRN

## 2023-09-17 MED ORDER — LEVOFLOXACIN 500 MG PO TABS: 500.0000 mg | ORAL_TABLET | Freq: Every day | ORAL | 0 refills | Status: AC

## 2023-09-17 NOTE — Telephone Encounter (Signed)
 Patient called and advised azelastine  was sent in yesterday to the pharmacy and asked does she use all the requested nasal sprays, she says yes.

## 2023-09-17 NOTE — Telephone Encounter (Signed)
 Have sent refill for 7 more days of levofloxacin  and nasal sprays. Go to ER if cough or shortness of breath gets worse.

## 2023-09-17 NOTE — Telephone Encounter (Signed)
 FYI Only or Action Required?: Action required by provider: clinical question for provider and update on patient condition.  Patient was last seen in primary care on 09/13/2023 by Erin Nancyann BRAVO, MD.  Called Nurse Triage reporting Advice Only.  Symptoms began several days ago.  Interventions attempted: Prescription medications: nasal sprays, prednisone , cough medication.  Symptoms are: unchanged.  Triage Disposition: Call PCP Within 24 Hours  Patient/caregiver understands and will follow disposition?: Yes   Reason for Disposition  [1] Caller has NON-URGENT question (includes prescribed medication questions) AND [2] triager unable to answer  Answer Assessment - Initial Assessment Questions 1. MAIN CONCERN OR SYMPTOM:  What is your main concern right now? What question do you have? What's the main symptom you're worried about? (e.g., breathing difficulty, cough, fever, pain)     Coughing up green phlegm, SOB with coughing, tired like it was when seen in office  2. BETTER-SAME-WORSE: Are you getting better, staying the same, or getting worse compared to how you felt at your last visit to the doctor (most recent medical visit)?     Same  3. VISIT DATE: When were you seen? (e.g., date)     09/13/23  4. VISIT DOCTOR: What is the name of the doctor taking care of you now?     Dr. Gasper  6. VISIT DIAGNOSIS:  What was the main symptom or problem that you were seen for? Were you given a diagnosis?      Xrays showed pneumonia and bronchitis  7. VISIT MEDICINES: Did the doctor order any new medicines for you to use? If Yes, ask: Have you filled the prescription and started taking the medicine?      All of the medications he prescribed at the visit I'm taking and nothing has changed, still coughing   9. PAIN: Is there any pain? If Yes, ask: How bad is it?  (Scale 0-10; or none, mild, moderate, severe)     NO 10. FEVER: Do you have a fever? If Yes, ask: What is  it, how was it measured  and when did it start?       No 11. OTHER SYMPTOMS: Do you have any other symptoms?       Tired, cold  Protocols used: Recent Medical Visit for Illness Follow-up Call-A-AH

## 2023-09-17 NOTE — Telephone Encounter (Unsigned)
 Copied from CRM #[id number]. Topic: General - Other >> Sep 17, 2023 10:45 AM Erin Richards wrote: Reason for CRM: Pt called back to give update pt stated she is NOT improving, she is still coughing. She would like a call back at [phone].

## 2023-09-17 NOTE — Telephone Encounter (Signed)
 See nurse triage notes, patient reports symptoms not improving. Requests a call back today if possible.

## 2023-09-17 NOTE — Telephone Encounter (Unsigned)
 Copied from CRM [id number]. Topic: Clinical - Medication Refill >> Sep 17, 2023 10:50 AM Zebedee SAUNDERS wrote: Medication: ipratropium (ATROVENT ) 0.06 % nasal spray, Azelastine  HCl 137 MCG/SPRAY SOLN, fluticasone  (FLONASE ) 50 MCG/ACT nasal spray, estradiol  (ESTRACE ) 0.5 MG tablet  Has the patient contacted their pharmacy? Yes (Agent: If no, request that the patient contact the pharmacy for the refill. If patient does not wish to contact the pharmacy document the reason why and proceed with request.) (Agent: If yes, when and what did the pharmacy advise?)Pharmacy need PCP approval  This is the patient's preferred pharmacy:  [id number] - [place], [place] - [address] [place] [address] Phone: [phone] Fax: [phone]  Is this the correct pharmacy for this prescription? Yes If no, delete pharmacy and type the correct one.   Has the prescription been filled recently? Yes  Is the patient out of the medication? Yes  Has the patient been seen for an appointment in the last year OR does the patient have an upcoming appointment? Yes  Can we respond through MyChart? Yes  Agent: Please be advised that Rx refills may take up to 3 business days. We ask that you follow-up with your pharmacy.

## 2023-09-17 NOTE — Telephone Encounter (Signed)
 Requested medication (s) are due for refill today: Yes  Requested medication (s) are on the active medication list: Yes  Last refill:  11/06/22 and 02/23/23  Future visit scheduled: No  Notes to clinic:  Routing to provider for approval      Requested Prescriptions  Pending Prescriptions Disp Refills   ipratropium (ATROVENT ) 0.06 % nasal spray 15 mL 12    Sig: Place 2 sprays into both nostrils 4 (four) times daily.     Off-Protocol Failed - 09/17/2023  3:47 PM      Failed - Medication not assigned to a protocol, review manually.      Passed - Valid encounter within last 12 months    Recent Outpatient Visits           4 days ago Subacute cough   [place] [facility] Gasper Nancyann BRAVO, MD   2 weeks ago Subacute cough   [facility] Glenard Mire, MD   2 months ago Annual physical exam   [facility] Gas[facility] Nancyann BRAVO, MD   5 months ago Acute bronchitis, unspecified organism   [facility] [place], Angeline ORN, NP   5 months ago SOB (shortness of breath)   [place] [facility] Melvin Pao, NP             Off-Protocol Failed - 09/17/2023  3:47 PM      Failed - Medication not assigned to a protocol, review manually.      Passed - Valid encounter within last 12 months    Recent Outpatient Visits           4 days ago Subacute cough   [place] [facility] Gasper Nancyann BRAVO, MD   2 weeks ago Subacute cough   [facility] Glenard Mire, MD   2 months ago Annual physical exam   [facility] Nancyann BRAVO, MD   5 months ago Acute bronchitis, unspecified organism   [facility] [place], [place] W, NP   5 months ago SOB (shortness of breath)   [place] [facility] Melvin Pao, NP               fluticasone  (FLONASE ) 50 MCG/ACT nasal spray  48 mL 1    Sig: Place 2 sprays into both nostrils daily as needed for allergies or rhinitis.     Ear, Nose, and Throat: Nasal Preparations - Corticosteroids Passed - 09/17/2023  3:47 PM      Passed - Valid encounter within last 12 months    Recent Outpatient Visits           4 days ago Subacute cough   [place] [facility] Gas[facility] Nancyann BRAVO, MD   2 weeks ago Subacute cough   [facility] Glenard Mire, MD   2 months ago Annual physical exam   [facility] Gas[facility] Nancyann BRAVO, MD   5 months ago Acute bronchitis, unspecified organism   [facility] [place], [place] W, NP   5 months ago SOB (shortness of breath)   [place] [facility] Melvin Pao, NP              Signed Prescriptions Disp Refills   estradiol  (ESTRACE ) 0.5 MG tablet 90 tablet 1    Sig: Take 1 tablet (0.5 mg total) by mouth  daily.     OB/GYN:  Estrogens  Passed - 09/17/2023  3:47 PM      Passed - Mammogram is up-to-date per Health Maintenance      Passed - Last BP in normal range    BP Readings from Last 1 Encounters:  09/13/23 (!) 95/58         Passed - Valid encounter within last 12 months    Recent Outpatient Visits           4 days ago Subacute cough   [place] [facility] Gasper Nancyann BRAVO, MD   2 weeks ago Subacute cough   [facility] Glenard Mire, MD   2 months ago Annual physical exam   [place] [facility] Gasper Nancyann BRAVO, MD   5 months ago Acute bronchitis, unspecified organism   [facility] [place], Angeline ORN, NP   5 months ago SOB (shortness of breath)    [facility] Melvin Pao, NP

## 2023-09-20 NOTE — Telephone Encounter (Signed)
 Advised

## 2023-09-24 DIAGNOSIS — F411 Generalized anxiety disorder: Secondary | ICD-10-CM | POA: Diagnosis not present

## 2023-09-24 DIAGNOSIS — F5102 Adjustment insomnia: Secondary | ICD-10-CM | POA: Diagnosis not present

## 2023-09-24 DIAGNOSIS — F4310 Post-traumatic stress disorder, unspecified: Secondary | ICD-10-CM | POA: Diagnosis not present

## 2023-09-24 DIAGNOSIS — F331 Major depressive disorder, recurrent, moderate: Secondary | ICD-10-CM | POA: Diagnosis not present

## 2023-10-01 ENCOUNTER — Telehealth: Payer: Self-pay

## 2023-10-01 DIAGNOSIS — R052 Subacute cough: Secondary | ICD-10-CM

## 2023-10-01 NOTE — Progress Notes (Signed)
 Complex Care Management Note  Care Guide Note 10/01/2023 Name: Erin Richards MRN: [id number] DOB: 05/22/1965  Erin Richards is a 58 y.o. year old female who sees Fisher, Nancyann BRAVO, MD for primary care. I reached out to Erin Richards by phone today to offer complex care management services.  Erin Richards was given information about Complex Care Management services today including:   The Complex Care Management services include support from the care team which includes your Nurse Care Manager, Clinical Social Worker, or Pharmacist.  The Complex Care Management team is here to help remove barriers to the health concerns and goals most important to you. Complex Care Management services are voluntary, and the patient may decline or stop services at any time by request to their care team member.   Complex Care Management Consent Status: Patient agreed to services and verbal consent obtained.   Follow up plan:  Telephone appointment with complex care management team member scheduled for:  10/15/23 at 3:00 p.m.   Encounter Outcome:  Patient Scheduled  Dreama [facility]  [facility] VBCI Assistant Direct Dial: [phone]  Fax: [phone]

## 2023-10-07 DIAGNOSIS — F331 Major depressive disorder, recurrent, moderate: Secondary | ICD-10-CM | POA: Diagnosis not present

## 2023-10-07 DIAGNOSIS — F411 Generalized anxiety disorder: Secondary | ICD-10-CM | POA: Diagnosis not present

## 2023-10-07 DIAGNOSIS — F4310 Post-traumatic stress disorder, unspecified: Secondary | ICD-10-CM | POA: Diagnosis not present

## 2023-10-15 ENCOUNTER — Other Ambulatory Visit: Payer: Self-pay
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
## 2023-10-15 NOTE — Patient Instructions (Signed)
 Visit Information  Erin Richards was given information about Medicaid Managed Care team care coordination services as a part of their Healthy [facility] Medicaid benefit. Erin Richards   If you would like to schedule transportation through your Healthy [facility] plan, please call the following number at least 2 days in advance of your appointment: [phone]  For information about your ride after you set it up, call Ride Assist at [phone]. Use this number to activate a Will Call pickup, or if your transportation is late for a scheduled pickup. Use this number, too, if you need to make a change or cancel a previously scheduled reservation.  If you need transportation services right away, call [phone]. The after-hours call center is staffed 24 hours to handle ride assistance and urgent reservation requests (including discharges) 365 days a year. Urgent trips include sick visits, hospital discharge requests and life-sustaining treatment.  Call the [facility] Line at [phone], at any time, 24 hours a day, 7 days a week. If you are in danger or need immediate medical attention call 911.   Erin Richards - following are the goals we discussed in your visit today:   Goals Addressed             This Visit's Progress    VBCI RN Care Plan - Subacute Cough/Fatigue       Problems:  Chronic Disease Management support and education needs related to Subacute Cough  Goal: Over the next 90 days the Patient will attend all scheduled medical appointments: Patient will attend appoontments as evidenced by chart review        continue to work with RN Care Manager and/or Social Worker to address care management and care coordination needs related to Subacute Cough/Fatigue as evidenced by adherence to care management team scheduled appointments     take all medications exactly as prescribed and will call provider for medication related questions as evidenced by patient reporting compliance with  all meds    verbalize basic understanding of Subacute Cough and fatigue disease process and self health management plan as evidenced by take all medications as prescribed, report changes in symptoms to provider immediately, utilize techniques to manage cough and fatigue.   Interventions:   Evaluation of current treatment plan related to Subacute cough and fatigue, self-management and patient's adherence to plan as established by provider. Discussed plans with patient for ongoing care management follow up and provided patient with direct contact information for care management team Advised patient to hydrate with water more frequently (drinks soda) Provided education to patient re: energy conservation, managing fatigue, chronic cough Screening for signs and symptoms of depression related to chronic disease state - patient in counseling and has psychiatrist Assessed social determinant of health barriers - no needs RNCM notifying provider patient continues with green sputum and cough unchanged. Made aware patient should not wait for upcoming appointments to discuss concerns.   Patient Self-Care Activities:  Attend all scheduled provider appointments Call pharmacy for medication refills 3-7 days in advance of running out of medications Call provider office for new concerns or questions  Take medications as prescribed    Plan:  Telephone follow up appointment with care management team member scheduled for:  10/29/23 at 3:30 pm             Please see education materials related to Energy Conservation, Fatigue, Cough provided by MyChart link.  Patient verbalizes understanding of instructions and care plan provided today and agrees to view in MyChart.  Active MyChart status and patient understanding of how to access instructions and care plan via MyChart confirmed with patient.     Telephone follow up appointment with Managed Medicaid care management team member scheduled for: 10/29/2023 at  3:30 pm  Erin Duos, MSN, RN [place]  [facility], [facility] Health RN Care Manager Direct Dial: [phone] Fax: [phone]  Fatigue If you have fatigue, you feel tired all the time and have a lack of energy or a lack of motivation. Fatigue may make it difficult to start or complete tasks because of exhaustion. Occasional or mild fatigue is often a normal response to activity or life. However, long-term (chronic) or extreme fatigue may be a symptom of a medical condition such as: Depression. Not having enough red blood cells or hemoglobin in the blood (anemia). A problem with a small gland located in the lower front part of the neck (thyroid  disorder). Rheumatologic conditions. These are problems related to the body's defense system (immune system). Infections, especially certain viral infections. Fatigue can also lead to negative health outcomes over time. Follow these instructions at home: Medicines Take over-the-counter and prescription medicines only as told by your health care provider. Take a multivitamin if told by your health care provider. Do not use herbal or dietary supplements unless they are approved by your health care provider. Eating and drinking  Avoid heavy meals in the evening. Eat a well-balanced diet, which includes lean proteins, whole grains, plenty of fruits and vegetables, and low-fat dairy products. Avoid eating or drinking too many products with caffeine in them. Avoid alcohol. Drink enough fluid to keep your urine pale yellow. Activity  Exercise regularly, as told by your health care provider. Use or practice techniques to help you relax, such as yoga, tai chi, meditation, or massage therapy. Lifestyle Change situations that cause you stress. Try to keep your work and personal schedules in balance. Do not use recreational or illegal drugs. General instructions Monitor your fatigue for any changes. Go to bed and get up at  the same time every day. Avoid fatigue by pacing yourself during the day and getting enough sleep at night. Maintain a healthy weight. Contact a health care provider if: Your fatigue does not get better. You have a fever. You suddenly lose or gain weight. You have headaches. You have trouble falling asleep or sleeping through the night. You feel angry, guilty, anxious, or sad. You have swelling in your legs or another part of your body. Get help right away if: You feel confused, feel like you might faint, or faint. Your vision is blurry or you have a severe headache. You have severe pain in your abdomen, your back, or the area between your waist and hips (pelvis). You have chest pain, shortness of breath, or an irregular or fast heartbeat. You are unable to urinate, or you urinate less than normal. You have abnormal bleeding from the rectum, nose, lungs, nipples, or, if you are female, the vagina. You vomit blood. You have thoughts about hurting yourself or others. These symptoms may be an emergency. Get help right away. Call 911. Do not wait to see if the symptoms will go away. Do not drive yourself to the hospital. Get help right away if you feel like you may hurt yourself or others, or have thoughts about taking your own life. Go to your nearest emergency room or: Call 911. Call the National Suicide Prevention Lifeline at 507-805-7188 or 988. This is open 24 hours a day. Text  the Crisis Text Line at 281-686-7965. Summary If you have fatigue, you feel tired all the time and have a lack of energy or a lack of motivation. Fatigue may make it difficult to start or complete tasks because of exhaustion. Long-term (chronic) or extreme fatigue may be a symptom of a medical condition. Exercise regularly, as told by your health care provider. Change situations that cause you stress. Try to keep your work and personal schedules in balance. This information is not intended to replace advice given  to you by your health care provider. Make sure you discuss any questions you have with your health care provider. Document Revised: 11/11/2020 Document Reviewed: 11/11/2020 Elsevier Patient Education  2024 Elsevier Inc.  Cough, Adult A cough helps to clear your throat and lungs. It may be a sign of an illness or another condition. A short-term (acute) cough may last 2-3 Jazmene Racz. A long-term (chronic) cough may last 8 or more Manual Navarra. Many things can cause a cough. They include: Illnesses such as: An infection in your throat or lungs. Asthma or other heart or lung problems. Gastroesophageal reflux. This is when acid comes back up from your stomach. Breathing in things that bother (irritate) your lungs. Allergies. Postnasal drip. This is when mucus runs down the back of your throat. Smoking. Some medicines. Follow these instructions at home: Medicines Take over-the-counter and prescription medicines only as told by your doctor. Talk with your doctor before you take cough medicine (cough suppressants). Eating and drinking Do not drink alcohol. Do not drink caffeine. Drink enough fluid to keep your pee (urine) pale yellow. Lifestyle Stay away from cigarette smoke. Do not smoke or use any products that contain nicotine or tobacco. If you need help quitting, ask your doctor. Stay away from things that make you cough. These may include perfume, candles, cleaning products, or campfire smoke. General instructions  Watch for any changes to your cough. Tell your doctor about them. Always cover your mouth when you cough. If the air is dry in your home, use a cool mist vaporizer or humidifier. If your cough is worse at night, try using extra pillows to raise your head up higher while you sleep. Rest as needed. Contact a doctor if: You have new symptoms. Your symptoms get worse. You cough up pus. You have a fever that does not go away. Your cough does not get better after 2-3 Dorsel Flinn. Cough  medicine does not help, and you are not sleeping well. You have pain that gets worse or is not helped with medicine. You are losing weight and do not know why. You have night sweats. Get help right away if: You cough up blood. You have trouble breathing. Your heart is beating very fast. These symptoms may be an emergency. Get help right away. Call 911. Do not wait to see if the symptoms will go away. Do not drive yourself to the hospital. This information is not intended to replace advice given to you by your health care provider. Make sure you discuss any questions you have with your health care provider. Document Revised: 09/19/2021 Document Reviewed: 09/19/2021 Elsevier Patient Education  2024 [employer].    Energy Conservation Techniques  Sit for as many activities as possible. Use slow, smooth movements.  Rushing increases discomfort. Determine the necessity of performing the task.  Simplify those tasks that are necessary.  (Get clothes out of the dryer when they are warm instead of ironing, let dishes air dry, etc.) Take frequent rests both during  and between activities.  Avoid repetitive tasks. Pre-plan your activities; try a daily and/or weekly schedule.  Spread out the activities that are most fatiguing (break up cleaning tasks over multiple days). Remember to plan a balance of work, rest and recreation. Consider the best time for each activity.  Do the most exertive task when you have the most energy. Don't carry items if you can push them.  Slide, don't lift. Push, don't pull. Utilize two hands when appropriate. Maintain good posture and use proper body mechanics. Avoid remaining in one position for too long. When lifting, bend at the knees, not at the waist.  Exhale when bending down, inhale when straightening up. Carry objects as close to your body and as near to the center of the pelvis.  11. Avoid wasted body movements (position yourself for the task so that you  avoid bending, twisting, etc.                when possible). 12. Select the best working environment.  Consider lighting, ventilation, clothing, and equipment. 13. Organize your storage areas, making the items you use daily convenient.  Store heaviest items at waist            height.  Store frequently used items between shoulders and knee height.  Consider leaving frequently used       items on countertops.  (You can organize in storage baskets based on time used/purpose). 14. Feelings and emotions can be real causes of fatigue.  Try to avoid unnecessary worry, irritation, or                    frustration.  Avoid stress, it can also be a source of fatigue. 15. Get help from other people for difficult tasks. 16. Explore equipment or items that may be able to do the job for you with greater ease.  (Electric can        openers, blenders, lightweight items for cleaning, etc.)

## 2023-10-16 DIAGNOSIS — R0902 Hypoxemia: Secondary | ICD-10-CM | POA: Diagnosis not present

## 2023-10-16 DIAGNOSIS — Q121 Congenital displaced lens: Secondary | ICD-10-CM | POA: Diagnosis not present

## 2023-10-27 DIAGNOSIS — F4310 Post-traumatic stress disorder, unspecified: Secondary | ICD-10-CM | POA: Diagnosis not present

## 2023-10-29 ENCOUNTER — Other Ambulatory Visit: Payer: Self-pay
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
## 2023-10-29 NOTE — Patient Instructions (Signed)
 Visit Information  Erin Richards was given information about Medicaid Managed Care team care coordination services as a part of their Healthy [facility] Medicaid benefit. Erin Richards   If you would like to schedule transportation through your Healthy [facility] plan, please call the following number at least 2 days in advance of your appointment: [phone]  For information about your ride after you set it up, call Ride Assist at [phone]. Use this number to activate a Will Call pickup, or if your transportation is late for a scheduled pickup. Use this number, too, if you need to make a change or cancel a previously scheduled reservation.  If you need transportation services right away, call [phone]. The after-hours call center is staffed 24 hours to handle ride assistance and urgent reservation requests (including discharges) 365 days a year. Urgent trips include sick visits, hospital discharge requests and life-sustaining treatment.  Call the [facility] Line at [phone], at any time, 24 hours a day, 7 days a week. If you are in danger or need immediate medical attention call 911.   Please see education materials related to GERD Diet provided by MyChart link.  Patient verbalizes understanding of instructions and care plan provided today and agrees to view in MyChart. Active MyChart status and patient understanding of how to access instructions and care plan via MyChart confirmed with patient.     Telephone follow up appointment with Managed Medicaid care management team member scheduled for:  [name], MSN, RN [facility]  [facility] [place], [facility] Health RN Care Manager Direct Dial: [phone] Fax: [phone]   GERD in Adults: Diet Changes When you have gastroesophageal reflux disease (GERD), you may need to make changes to your diet. Choosing the right foods can help with your symptoms. Think about working with an expert in healthy  eating called a dietitian. They can help you make healthy food choices. What are tips for following this plan? Reading food labels Look for foods that are low in saturated fat. Foods that may help with your symptoms include: Foods with less than 5% of daily value (DV) of fat. Foods with 0 grams of trans fat. Cooking [employer] in ways that don't use a lot of fat. These ways include: Baking. Steaming. Grilling. Broiling. To add flavor, try to use herbs that are low in spice and acidity. Avoid frying your food. Meal planning  Eat small meals often rather than eating 3 large meals each day. Eat your meals slowly in a place where you feel relaxed. If told by your health care provider, avoid: Foods that cause symptoms. Keep a food diary to keep track of foods that cause symptoms. Alcohol. Drinking a lot of liquid with meals. General instructions For 2-3 hours after you eat, avoid: Bending over. Exercise. Lying down. Chew sugar-free gum after meals. What foods should I eat? Eat a healthy diet. Try to include: Foods with high amounts of fiber. These include: Fruits and vegetables. Whole grains and beans. Low-fat dairy products. Lean meats, fish, and poultry. Egg whites. Foods that cause symptoms in someone else may not cause symptoms for you. Work with your provider to find foods that are safe for you. The items listed above may not be all the foods and drinks you can have. Talk with a dietitian to learn more. The items listed above may not be a complete list of foods and beverages you can eat and drink. Contact a dietitian for more information. What foods should I avoid?  Limiting some of these foods may help with your symptoms. Each person is different. Talk with a dietitian or your provider to help you find the exact foods to avoid. Some of the foods to avoid may include: Fruits Fruits with a lot of acid in them. These may include citrus fruits, such as oranges, grapefruit,  pineapple, and lemons. Vegetables Deep-fried vegetables, such as [place] fries. Vegetables, sauces, or toppings made with added fat and vegetables with acid in them. These may include tomatoes and tomato products, chili peppers, onions, garlic, and horseradish. Grains Pastries or quick breads with added fat. Meats and other proteins High-fat meats, such as fatty beef or pork, hot dogs, ribs, ham, sausage, salami, and bacon. Fried meat or protein, such as fried fish and fried chicken. Egg yolks. Fats and oils Butter. Margarine. Shortening. Ghee. Drinks Coffee and other drinks with caffeine in them. Fizzy and sugary drinks, such as soda and energy drinks. Fruit juice made with acidic fruits, such as orange or grapefruit. Tomato juice. Sweets and desserts Chocolate and cocoa. Donuts. Seasonings and condiments Mint, such as peppermint and spearmint. Condiments, herbs, or seasonings that cause symptoms. These may include curry, hot sauce, or vinegar-based salad dressings. The items listed above may not be all the foods and drinks you should avoid. Talk with a dietitian to learn more. Questions to ask your health care provider Changes to your diet and everyday life are often the first steps taken to manage symptoms of GERD. If these changes don't help, talk with your provider about taking medicines. Where to find more information International Foundation for Gastrointestinal Disorders: aboutgerd.org This information is not intended to replace advice given to you by your health care provider. Make sure you discuss any questions you have with your health care provider. Document Revised: 12/01/2022 Document Reviewed: 06/17/2022 Elsevier Patient Education  2024 [employer].  Following is a copy of your plan of care:  There are no care plans that you recently modified to display for this patient.

## 2023-10-31 MED ORDER — IPRATROPIUM BROMIDE 0.06 % NA SOLN: 2.0000 | Freq: Four times a day (QID) | NASAL | 12 refills | Status: AC

## 2023-10-31 NOTE — Addendum Note (Signed)
 Addended by: GASPER NANCYANN BRAVO on: 10/31/2023 08:31 AM   Modules accepted: Orders

## 2023-11-02 NOTE — Progress Notes (Deleted)
 Erin Syring T. Nakeda Lebron, MD, CAQ Sports Medicine [facility] at [facility] [address] [place], [address]  Phone: [phone]  FAX: [phone]  [name] - 58 y.o. female  MRN [id number]  Date of Birth: [birth year]  Date: 11/04/2023  PCP: Erin Nancyann BRAVO, MD  Referral: Erin Nancyann BRAVO, MD  No chief complaint on file.  Subjective:   Erin Richards is a 58 y.o. very pleasant female patient with There is no height or weight on file to calculate BMI. who presents with the following:  Discussed the use of AI scribe software for clinical note transcription with the patient, who gave verbal consent to proceed.  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  She is having a persistent cough and fatigue. History of Present Illness     Review of Systems is noted in the HPI, as appropriate  Objective:   There were no vitals taken for this visit.  GEN: No acute distress; alert,appropriate. PULM: Breathing comfortably in no respiratory distress PSYCH: Normally interactive.   Laboratory and Imaging Data:  Assessment and Plan:   No diagnosis found. Assessment & Plan   Medication Management during today's office visit: No orders of the defined types were placed in this encounter.  There are no discontinued medications.  Orders placed today for conditions managed today: No orders of the defined types were placed in this encounter.   Disposition: No follow-ups on file.  [employer] One speech-to-text software was used for transcription in this dictation.  Possible transcriptional errors can occur using Animal nutritionist.   Signed,  Jacques DASEN. Adnan Vanvoorhis, MD   Outpatient Encounter Medications as of 11/04/2023  Medication Sig   Accu-Chek Softclix Lancets lancets Use to check blood sugar daily   acetaminophen  (TYLENOL ) 500 MG tablet Take 500 mg by mouth every 6 (six) hours as needed.   ALPRAZolam  (XANAX ) 0.5 MG tablet Take 0.5-1 tablets  (0.25-0.5 mg total) by mouth 2 (two) times daily as needed (pain attacks).   aspirin EC 81 MG tablet Take by mouth. (Patient not taking: Reported on 10/15/2023)   Azelastine  HCl 137 MCG/SPRAY SOLN PLACE 1 SPRAY INTO BOTH NOSTRILS 2 (TWO) TIMES DAILY   bacitracin  ointment Apply 1 Application topically 2 (two) times daily. (Patient not taking: Reported on 09/13/2023)   beclomethasone (QVAR  REDIHALER) 80 MCG/ACT inhaler Inhale 1 puff into the lungs 2 (two) times daily.   Blood Glucose Monitoring Suppl (ACCU-CHEK GUIDE) w/Device KIT Use daily to check blood sugars   buPROPion (WELLBUTRIN SR) 150 MG 12 hr tablet Take 150 mg by mouth every morning.   clonazePAM (KLONOPIN) 0.5 MG tablet Take 0.5 mg by mouth daily as needed.   clotrimazole -betamethasone  (LOTRISONE ) cream Apply to affected area 2 times daily prn (Patient not taking: Reported on 09/13/2023)   escitalopram  (LEXAPRO ) 10 MG tablet Take 10 mg by mouth daily.   estradiol  (ESTRACE ) 0.5 MG tablet Take 1 tablet (0.5 mg total) by mouth daily.   fluticasone  (FLONASE ) 50 MCG/ACT nasal spray Place 2 sprays into both nostrils daily as needed for allergies or rhinitis.   fluticasone -salmeterol (ADVAIR DISKUS) 250-50 MCG/ACT AEPB INHALE 1 PUFF INTO THE LUNGS 2 TIMES DAILY. RINSE MOUTH AFTER USE.   glucose blood (ACCU-CHEK GUIDE TEST) test strip Use as instructed to check blood sugar daily   hydrocortisone  (ANUSOL -HC) 25 MG suppository Place 1 suppository (25 mg total) rectally 2 (two) times daily. (Patient not taking: Reported on 10/29/2023)   hyoscyamine  (LEVSIN ) 0.125 MG  tablet TAKE ONE TABLET BY MOUTH EVERY 6 HOURS AS NEEDED FOR STOMACH CRAMPINGS   ipratropium (ATROVENT ) 0.06 % nasal spray Place 2 sprays into both nostrils 4 (four) times daily.   Ipratropium-Albuterol  (COMBIVENT  RESPIMAT) 20-100 MCG/ACT AERS respimat INHALE 1 PUFF INTO THE LUNGS EVERY 4 HOURS AS NEEDED FOR WHEEZING.   levocetirizine (XYZAL ) 5 MG tablet TAKE 1 TABLET BY MOUTH EVERY DAY IN  THE EVENING   linaclotide  (LINZESS ) 145 MCG CAPS capsule Take 1 capsule (145 mcg total) by mouth daily before breakfast. (Patient not taking: Reported on 10/15/2023)   methocarbamol  (ROBAXIN ) 500 MG tablet Take 1 tablet (500 mg total) by mouth every 8 (eight) hours as needed for muscle spasms. (Patient not taking: Reported on 10/15/2023)   MISCELLANEOUS VAGINAL PRODUCTS VA Place vaginally. (Patient not taking: Reported on 10/15/2023)   montelukast  (SINGULAIR ) 10 MG tablet TAKE 1 TABLET BY MOUTH EVERYDAY AT BEDTIME   Multiple Vitamin (MULTIVITAMIN WITH MINERALS) TABS tablet Take 2 tablets by mouth daily.   Multiple Vitamins-Minerals (VITAFUSION MULTI WOMENS PO) Take by mouth.   naproxen  (NAPROSYN ) 500 MG tablet TAKE 1 TABLET (500 MG TOTAL) BY MOUTH 2 (TWO) TIMES DAILY WITH A MEAL. AS NEEDED FOR PAIN   nortriptyline  (PAMELOR ) 25 MG capsule TAKE ONE CAPSULE AT BEDTIME FOR MIGRANE PREVENTION AND IRRITABLE BOWEL SYNDROME   Olopatadine  HCl 0.2 % SOLN Apply 1 drop to eye daily. (Patient not taking: Reported on 10/15/2023)   ondansetron  (ZOFRAN -ODT) 4 MG disintegrating tablet Take 1 tablet (4 mg total) by mouth every 6 (six) hours as needed for nausea or vomiting.   OXYGEN  Inhale 2 L into the lungs at bedtime as needed.   pantoprazole  (PROTONIX ) 40 MG tablet TAKE 1 TABLET (40 MG TOTAL) BY MOUTH DAILY. FOR ACID REFLUX (Patient not taking: Reported on 10/15/2023)   Probiotic Product (PROBIOTIC DAILY PO) Take by mouth.   promethazine  (PHENERGAN ) 12.5 MG tablet Take 1 tablet (12.5 mg total) by mouth every 8 (eight) hours as needed for nausea or vomiting. (Patient not taking: Reported on 09/13/2023)   promethazine -dextromethorphan (PROMETHAZINE -DM) 6.25-15 MG/5ML syrup Take 2.5 mLs by mouth 4 (four) times daily as needed for cough. (Patient not taking: Reported on 10/15/2023)   RYALTRIS 665-25 MCG/ACT SUSP Place into both nostrils.   Spacer/Aero-Holding Chambers (AEROCHAMBER PLUS) inhaler Use with inhaler   UBRELVY  50  MG TABS TAKE 50 MG BY MOUTH ONCE AS NEEDED FOR UP TO 1 DOSE (MIGRAINES, HEADACHES).   VENTOLIN  HFA 108 (90 Base) MCG/ACT inhaler Inhale 2 puffs into the lungs every 6 (six) hours as needed for wheezing or shortness of breath.   zolpidem  (AMBIEN ) 10 MG tablet TAKE 1 TABLET BY MOUTH EVERYDAY AT BEDTIME   No facility-administered encounter medications on file as of 11/04/2023.

## 2023-11-03 NOTE — Patient Outreach (Signed)
 RNCM - patient called to inform RNCM that acute appointment made was with different office - not just different provider. Confirmed with patient that scheduler did not share that info during our conference call. Patient stated cancelled that appointment and will see PCP 11/05/23.

## 2023-11-04 ENCOUNTER — Ambulatory Visit: Admitting: Family Medicine

## 2023-11-04 DIAGNOSIS — R052 Subacute cough: Secondary | ICD-10-CM

## 2023-11-05 ENCOUNTER — Ambulatory Visit: Admitting: Family Medicine

## 2023-11-05 VITALS — BP 100/54 | HR 107 | Temp 98.1°F | Ht 59.0 in | Wt 144.0 lb

## 2023-11-05 DIAGNOSIS — R5383 Other fatigue: Secondary | ICD-10-CM | POA: Diagnosis not present

## 2023-11-05 DIAGNOSIS — R0600 Dyspnea, unspecified: Secondary | ICD-10-CM

## 2023-11-05 DIAGNOSIS — J40 Bronchitis, not specified as acute or chronic: Secondary | ICD-10-CM

## 2023-11-05 DIAGNOSIS — J4540 Moderate persistent asthma, uncomplicated: Secondary | ICD-10-CM

## 2023-11-05 MED ORDER — PREDNISONE 10 MG PO TABS: ORAL_TABLET | ORAL | 0 refills | Status: AC

## 2023-11-05 MED ORDER — AMOXICILLIN-POT CLAVULANATE 875-125 MG PO TABS: 1.0000 | ORAL_TABLET | Freq: Two times a day (BID) | ORAL | 0 refills | Status: AC

## 2023-11-07 LAB — CBC
Hematocrit: 41.4 % (ref 34.0–46.6)
Hemoglobin: 13.3 g/dL (ref 11.1–15.9)
MCH: 27.3 pg (ref 26.6–33.0)
MCHC: 32.1 g/dL (ref 31.5–35.7)
MCV: 85 fL (ref 79–97)
Platelets: 170 x10E3/uL (ref 150–450)
RBC: 4.88 x10E6/uL (ref 3.77–5.28)
RDW: 14.1 % (ref 11.7–15.4)
WBC: 7.1 x10E3/uL (ref 3.4–10.8)

## 2023-11-07 LAB — TSH: TSH: 2.2 u[IU]/mL (ref 0.450–4.500)

## 2023-11-07 LAB — BRAIN NATRIURETIC PEPTIDE: BNP: 81.9 pg/mL (ref 0.0–100.0)

## 2023-11-09 ENCOUNTER — Ambulatory Visit: Payer: Self-pay | Admitting: Family Medicine

## 2023-11-09 DIAGNOSIS — R0600 Dyspnea, unspecified: Secondary | ICD-10-CM

## 2023-11-09 DIAGNOSIS — J45909 Unspecified asthma, uncomplicated: Secondary | ICD-10-CM

## 2023-11-10 ENCOUNTER — Encounter: Payer: Self-pay | Admitting: Student in an Organized Health Care Education/Training Program

## 2023-11-10 ENCOUNTER — Ambulatory Visit: Admitting: Student in an Organized Health Care Education/Training Program

## 2023-11-10 VITALS — BP 100/58 | HR 71 | Temp 97.5°F | Ht 59.0 in | Wt 145.6 lb

## 2023-11-10 DIAGNOSIS — J4540 Moderate persistent asthma, uncomplicated: Secondary | ICD-10-CM | POA: Diagnosis not present

## 2023-11-10 MED ORDER — FLUTICASONE-SALMETEROL 250-50 MCG/ACT IN AEPB: 1.0000 | INHALATION_SPRAY | Freq: Two times a day (BID) | RESPIRATORY_TRACT | 11 refills | Status: AC

## 2023-11-10 NOTE — Patient Instructions (Signed)
 Today, I ordered blood work. You can get them draw at your preferred [facility] draw station. The nearest one to [facility] is at nearby [facility] ([address] [place], [place], [place] [address]).
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
## 2023-11-13 LAB — ALLERGEN PANEL (27) + IGE
Alternaria Alternata IgE: <0.1 kU/L
Aspergillus Fumigatus IgE: <0.1 kU/L
Bahia Grass IgE: <0.1 kU/L
Bermuda Grass IgE: <0.1 kU/L
Cat Dander IgE: <0.1 kU/L
Cedar, Mountain IgE: <0.1 kU/L
Cladosporium Herbarum IgE: <0.1 kU/L
Cocklebur IgE: <0.1 kU/L
Cockroach, American IgE: <0.1 kU/L
Common Silver Birch IgE: <0.1 kU/L
D Farinae IgE: <0.1 kU/L
D Pteronyssinus IgE: <0.1 kU/L
Dog Dander IgE: <0.1 kU/L
Elm, American IgE: <0.1 kU/L
Hickory, White IgE: <0.1 kU/L
IgE (Immunoglobulin E), Serum: 5 [IU]/mL — ABNORMAL LOW (ref 6–495)
Johnson Grass IgE: <0.1 kU/L
Kentucky Bluegrass IgE: <0.1 kU/L
Maple/Box Elder IgE: <0.1 kU/L
Mucor Racemosus IgE: <0.1 kU/L
Oak, White IgE: <0.1 kU/L
Penicillium Chrysogen IgE: <0.1 kU/L
Pigweed, Rough IgE: <0.1 kU/L
Plantain, English IgE: <0.1 kU/L
Ragweed, Short IgE: <0.1 kU/L
Setomelanomma Rostrat: <0.1 kU/L
Timothy Grass IgE: <0.1 kU/L
White Mulberry IgE: <0.1 kU/L

## 2023-11-13 LAB — CBC WITH DIFFERENTIAL/PLATELET
Basophils Absolute: 0 x10E3/uL (ref 0.0–0.2)
Basos: 0 %
EOS (ABSOLUTE): 0 x10E3/uL (ref 0.0–0.4)
Eos: 0 %
Hematocrit: 42.1 % (ref 34.0–46.6)
Hemoglobin: 13.2 g/dL (ref 11.1–15.9)
Immature Grans (Abs): 0 x10E3/uL (ref 0.0–0.1)
Immature Granulocytes: 0 %
Lymphocytes Absolute: 0.8 x10E3/uL (ref 0.7–3.1)
Lymphs: 11 %
MCH: 26.7 pg (ref 26.6–33.0)
MCHC: 31.4 g/dL — ABNORMAL LOW (ref 31.5–35.7)
MCV: 85 fL (ref 79–97)
Monocytes Absolute: 0.3 x10E3/uL (ref 0.1–0.9)
Monocytes: 5 %
Neutrophils Absolute: 5.8 x10E3/uL (ref 1.4–7.0)
Neutrophils: 84 %
Platelets: 191 x10E3/uL (ref 150–450)
RBC: 4.94 x10E6/uL (ref 3.77–5.28)
RDW: 13.7 % (ref 11.7–15.4)
WBC: 7 x10E3/uL (ref 3.4–10.8)

## 2023-11-16 ENCOUNTER — Ambulatory Visit: Payer: Self-pay | Admitting: Student in an Organized Health Care Education/Training Program

## 2023-11-17 DIAGNOSIS — F411 Generalized anxiety disorder: Secondary | ICD-10-CM | POA: Diagnosis not present

## 2023-11-17 DIAGNOSIS — F4310 Post-traumatic stress disorder, unspecified: Secondary | ICD-10-CM | POA: Diagnosis not present

## 2023-11-19 ENCOUNTER — Telehealth: Payer: Self-pay

## 2023-11-19 ENCOUNTER — Other Ambulatory Visit: Payer: Self-pay | Admitting: Family Medicine

## 2023-11-19 ENCOUNTER — Ambulatory Visit (INDEPENDENT_AMBULATORY_CARE_PROVIDER_SITE_OTHER)

## 2023-11-19 ENCOUNTER — Other Ambulatory Visit (HOSPITAL_COMMUNITY): Payer: Self-pay

## 2023-11-19 ENCOUNTER — Ambulatory Visit: Payer: Self-pay | Admitting: Student in an Organized Health Care Education/Training Program

## 2023-11-19 ENCOUNTER — Ambulatory Visit
Admission: EM | Admit: 2023-11-19 | Discharge: 2023-11-19 | Disposition: A | Discharge status: Home / Self Care | Attending: Emergency Medicine | Admitting: Emergency Medicine

## 2023-11-19 DIAGNOSIS — F411 Generalized anxiety disorder: Secondary | ICD-10-CM | POA: Diagnosis not present

## 2023-11-19 DIAGNOSIS — F4310 Post-traumatic stress disorder, unspecified: Secondary | ICD-10-CM | POA: Diagnosis not present

## 2023-11-19 DIAGNOSIS — R052 Subacute cough: Secondary | ICD-10-CM | POA: Diagnosis not present

## 2023-11-19 DIAGNOSIS — R0602 Shortness of breath: Secondary | ICD-10-CM

## 2023-11-19 DIAGNOSIS — F5102 Adjustment insomnia: Secondary | ICD-10-CM | POA: Diagnosis not present

## 2023-11-19 DIAGNOSIS — F331 Major depressive disorder, recurrent, moderate: Secondary | ICD-10-CM | POA: Diagnosis not present

## 2023-11-19 DIAGNOSIS — J189 Pneumonia, unspecified organism: Secondary | ICD-10-CM

## 2023-11-19 MED ORDER — DOXYCYCLINE HYCLATE 100 MG PO CAPS: 100.0000 mg | ORAL_CAPSULE | Freq: Two times a day (BID) | ORAL | 0 refills | Status: AC

## 2023-11-19 MED ORDER — ONDANSETRON 4 MG PO TBDP: 4.0000 mg | ORAL_TABLET | Freq: Three times a day (TID) | ORAL | 0 refills | Status: DC | PRN
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
## 2023-11-19 NOTE — Discharge Instructions (Addendum)
 Follow up with your primary care provider tomorrow.  Go to the emergency department if you have worsening symptoms.     Your chest x-ray is pending.  I will call you with the result.

## 2023-11-19 NOTE — Telephone Encounter (Signed)
 Pharmacy Patient Advocate Encounter  Received notification from [employer] that Prior Authorization for Ubrelvy  50MG  tablets  has been APPROVED from 10.17.25 to 10.17.26. Ran test claim, Copay is $4.00. This test claim was processed through [facility]- copay amounts may vary at other pharmacies due to pharmacy/plan contracts, or as the patient moves through the different stages of their insurance plan.   PA #/Case ID/Reference #: [id number]

## 2023-11-19 NOTE — ED Triage Notes (Addendum)
 Patient to Urgent Care with complaints of drainage/ [facility] (reports all the time)/ persistent cough/ hoarseness. Reports the drainage makes her cough.  Symptoms x3 months.   Using wixela inhaler w/ little relief. Increases usage of albuterol . Zyrtec  and singulair  daily.

## 2023-11-19 NOTE — Telephone Encounter (Signed)
 FYI Only or Action Required?: FYI only for provider.  Patient is followed in Pulmonology for asthma, last seen on 11/10/2023 by Isadora Hose, MD.  Called Nurse Triage reporting Shortness of Breath.  Symptoms began a week ago.  Interventions attempted: Rescue inhaler and Maintenance inhaler.  Symptoms are: gradually worsening.  Triage Disposition: See Physician Within 24 Hours  Patient/caregiver understands and will follow disposition?: Yes            Copied from CRM #[id number]. Topic: Clinical - Red Word Triage >> Nov 19, 2023  4:45 PM [name] wrote: Red Word that prompted transfer to Nurse Triage: shortness of breath worsening with inhaler Reason for Disposition  SEVERE coughing spells (e.g., whooping sound after coughing, vomiting after coughing)    Triager advised UC d/t no access with [facility] over the weekend.  Answer Assessment - Initial Assessment Questions E2C2 Pulmonary Triage - Initial Assessment Questions Chief Complaint (e.g., cough, sob, wheezing, fever, chills, sweat or additional symptoms) *Go to specific symptom protocol after initial questions. Worsening SOB, cough  How long have symptoms been present? > 1 week  Have you tested for COVID or Flu? Note: If not, ask patient if a home test can be taken. If so, instruct patient to call back for positive results. No  MEDICINES:   Have you used any OTC meds to help with symptoms? No If yes, ask What medications? N/a  Have you used your inhalers/maintenance medication? Yes If yes, What medications? fluticasone -salmeterol (WIXELA INHUB) - as prescribed Albuterol  INH - using 3-4 x a day with minimal relief  If inhaler, ask How many puffs and how often? Note: Review instructions on medication in the chart. See above  OXYGEN : Do you wear supplemental oxygen ? Yes If yes, How many liters are you supposed to use? PRN at night  Do you monitor your oxygen  levels? Yes If yes, What is your  reading (oxygen  level) today? 96-97  What is your usual oxygen  saturation reading?  (Note: Pulmonary O2 sats should be 90% or greater) *No Answer*          1. RESPIRATORY STATUS: Describe your breathing? (e.g., wheezing, shortness of breath, unable to speak, severe coughing)      Severe coughing fit -  2. ONSET: When did this breathing problem begin?      See above 3. PATTERN Does the difficult breathing come and go, or has it been constant since it started?      constant 4. SEVERITY: How bad is your breathing? (e.g., mild, moderate, severe)      Mild with exertion Triager does appreciate audible SOB/hoarse voice during call. Pt is speaking in partial-full sentences. 5. RECURRENT SYMPTOM: Have you had difficulty breathing before? If Yes, ask: When was the last time? and What happened that time?      *No Answer* 6. CARDIAC HISTORY: Do you have any history of heart disease? (e.g., heart attack, angina, bypass surgery, angioplasty)      *No Answer* 7. LUNG HISTORY: Do you have any history of lung disease?  (e.g., pulmonary embolus, asthma, emphysema)     asthma 8. CAUSE: What do you think is causing the breathing problem?     Pt endorses going into coughing fits where she feels like she cannot catch her breath.  9. OTHER SYMPTOMS: Do you have any other symptoms? (e.g., chest pain, cough, dizziness, fever, runny nose)     Post-nasal drip cough 10. O2 SATURATION MONITOR:  Do you use an oxygen  saturation monitor (pulse  oximeter) at home? If Yes, ask: What is your reading (oxygen  level) today? What is your usual oxygen  saturation reading? (e.g., 95%)       See above 11. PREGNANCY: Is there any chance you are pregnant? When was your last menstrual period?       N/a 12. TRAVEL: Have you traveled out of the country in the last month? (e.g., travel history, exposures)       *No Answer*  Protocols used: Breathing Difficulty-A-AH, Cough -  Chronic-A-AH

## 2023-11-20 ENCOUNTER — Other Ambulatory Visit: Payer: Self-pay | Admitting: Family Medicine

## 2023-11-20 DIAGNOSIS — G43809 Other migraine, not intractable, without status migrainosus: Secondary | ICD-10-CM

## 2023-11-22 ENCOUNTER — Emergency Department

## 2023-11-22 ENCOUNTER — Ambulatory Visit (HOSPITAL_COMMUNITY): Payer: Self-pay

## 2023-11-22 ENCOUNTER — Emergency Department
Admission: EM | Admit: 2023-11-22 | Discharge: 2023-11-22 | Disposition: A | Discharge status: Home / Self Care | Attending: Emergency Medicine | Admitting: Emergency Medicine

## 2023-11-22 ENCOUNTER — Telehealth: Payer: Self-pay

## 2023-11-22 ENCOUNTER — Ambulatory Visit (INDEPENDENT_AMBULATORY_CARE_PROVIDER_SITE_OTHER): Admitting: Family Medicine

## 2023-11-22 ENCOUNTER — Ambulatory Visit: Payer: Self-pay

## 2023-11-22 ENCOUNTER — Other Ambulatory Visit: Payer: Self-pay

## 2023-11-22 ENCOUNTER — Encounter: Payer: Self-pay | Admitting: Family Medicine

## 2023-11-22 ENCOUNTER — Other Ambulatory Visit (HOSPITAL_COMMUNITY): Payer: Self-pay

## 2023-11-22 ENCOUNTER — Other Ambulatory Visit: Payer: Self-pay | Admitting: Family Medicine

## 2023-11-22 VITALS — BP 112/64 | HR 84 | Wt 145.4 lb

## 2023-11-22 DIAGNOSIS — R052 Subacute cough: Secondary | ICD-10-CM | POA: Diagnosis not present

## 2023-11-22 DIAGNOSIS — R079 Chest pain, unspecified: Secondary | ICD-10-CM | POA: Insufficient documentation

## 2023-11-22 DIAGNOSIS — D72829 Elevated white blood cell count, unspecified: Secondary | ICD-10-CM | POA: Diagnosis not present

## 2023-11-22 DIAGNOSIS — J189 Pneumonia, unspecified organism: Secondary | ICD-10-CM | POA: Diagnosis not present

## 2023-11-22 DIAGNOSIS — R059 Cough, unspecified: Secondary | ICD-10-CM | POA: Insufficient documentation

## 2023-11-22 DIAGNOSIS — R091 Pleurisy: Secondary | ICD-10-CM | POA: Insufficient documentation

## 2023-11-22 DIAGNOSIS — R42 Dizziness and giddiness: Secondary | ICD-10-CM | POA: Diagnosis not present

## 2023-11-22 DIAGNOSIS — R0600 Dyspnea, unspecified: Secondary | ICD-10-CM

## 2023-11-22 DIAGNOSIS — R0602 Shortness of breath: Secondary | ICD-10-CM | POA: Diagnosis not present

## 2023-11-22 LAB — BASIC METABOLIC PANEL WITH GFR
Anion gap: 11 (ref 5–15)
BUN: 9 mg/dL (ref 6–20)
CO2: 26 mmol/L (ref 22–32)
Calcium: 9.2 mg/dL (ref 8.9–10.3)
Chloride: 103 mmol/L (ref 98–111)
Creatinine, Ser: 0.76 mg/dL (ref 0.44–1.00)
GFR, Estimated: >60 mL/min (ref >60)
Glucose, Bld: 85 mg/dL (ref 70–99)
Potassium: 3.7 mmol/L (ref 3.5–5.1)
Sodium: 140 mmol/L (ref 135–145)

## 2023-11-22 LAB — CBC
HCT: 38.4 % (ref 36.0–46.0)
Hemoglobin: 12.5 g/dL (ref 12.0–15.0)
MCH: 27.5 pg (ref 26.0–34.0)
MCHC: 32.6 g/dL (ref 30.0–36.0)
MCV: 84.6 fL (ref 80.0–100.0)
Platelets: 164 K/uL (ref 150–400)
RBC: 4.54 MIL/uL (ref 3.87–5.11)
RDW: 14.7 % (ref 11.5–15.5)
WBC: 11.6 K/uL — ABNORMAL HIGH (ref 4.0–10.5)
nRBC: 0 % (ref 0.0–0.2)

## 2023-11-22 LAB — TROPONIN I (HIGH SENSITIVITY)
Troponin I (High Sensitivity): 3 ng/L (ref <18)
Troponin I (High Sensitivity): <2 ng/L (ref <18)

## 2023-11-22 MED ORDER — PANTOPRAZOLE SODIUM 40 MG PO TBEC: 40.0000 mg | DELAYED_RELEASE_TABLET | Freq: Every day | ORAL | 1 refills | Status: DC

## 2023-11-22 MED ORDER — HYDROCOD POLI-CHLORPHE POLI ER 10-8 MG/5ML PO SUER: 5.0000 mL | Freq: Every evening | ORAL | 0 refills | Status: DC | PRN

## 2023-11-22 MED ORDER — PREDNISONE 10 MG PO TABS: 10.0000 mg | ORAL_TABLET | Freq: Every day | ORAL | 0 refills | Status: DC

## 2023-11-22 MED ORDER — BENZONATATE 100 MG PO CAPS: 100.0000 mg | ORAL_CAPSULE | Freq: Three times a day (TID) | ORAL | 0 refills | Status: DC | PRN

## 2023-11-22 MED ORDER — ACETAMINOPHEN 500 MG PO TABS
1000.0000 mg | ORAL_TABLET | Freq: Once | ORAL | Status: AC
  Administered 2023-11-22: 1000 mg via ORAL
  Filled 2023-11-22: qty 2

## 2023-11-22 MED ORDER — IOHEXOL 350 MG/ML SOLN
75.0000 mL | Freq: Once | INTRAVENOUS | Status: AC | PRN
  Administered 2023-11-22: 75 mL via INTRAVENOUS

## 2023-11-22 NOTE — Progress Notes (Signed)
 Established patient visit   Patient: Erin Richards   DOB: 06/29/1965   58 y.o. Female  MRN: [id number] Visit Date: 11/22/2023  Today's healthcare provider: Nancyann Perry, MD   Chief Complaint  Patient presents with   Cough    Patient has been like this since August. Patient was seen at [facility], had chest xray done.   Subjective    Discussed the use of AI scribe software for clinical note transcription with the patient, who gave verbal consent to proceed.  History of Present Illness   Erin Richards is a 58 year old female who presents with a chronic cough.  She has been experiencing a persistent cough for the past three months, which has worsened recently. Despite multiple office visits and treatments with several courses of antibiotics and prednisone , there has been no significant improvement. She was last seen at urgent care three days ago, where a chest x-ray showed a persistent focal area of consolidation in the right upper lobe. She was prescribed doxycycline  100 mg twice a day.  She has been under the care of a pulmonologist since October 8th. An allergy panel was negative with low IgE, and she was prescribed Advair 250, one puff twice a day. She is scheduled for pulmonary function tests and has an upcoming appointment at [facility] on November 4th.  Her cough has worsened, and she is now producing a lot of green sputum. She experiences chest tightness when coughing and uses albuterol  three to four times a day, despite being prescribed one puff. She also feels lightheaded during coughing spells and has a sore throat from postnasal drainage.  She has tried hydrocodone cough syrup, which provided minimal relief. She wakes up coughing and feels like she cannot get enough air, affecting her ability to eat as she coughs while trying to swallow. 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         Medications: Outpatient Medications Prior to Visit  Medication Sig   Accu-Chek Softclix Lancets lancets Use to check blood sugar daily   acetaminophen  (TYLENOL ) 500 MG tablet Take 500 mg by mouth every 6 (six) hours as needed.   ALPRAZolam  (XANAX ) 0.5 MG tablet Take 0.5-1 tablets (0.25-0.5 mg total) by mouth 2 (two) times daily as needed (pain attacks).   Azelastine  HCl 137 MCG/SPRAY SOLN PLACE 1 SPRAY INTO BOTH NOSTRILS 2 (TWO) TIMES DAILY   Blood Glucose Monitoring Suppl (ACCU-CHEK GUIDE) w/Device KIT Use daily to check blood sugars   buPROPion (WELLBUTRIN SR) 150 MG 12 hr tablet Take 150 mg by mouth every morning.   clonazePAM (KLONOPIN) 0.5 MG tablet Take 0.5 mg by mouth daily as needed.   doxycycline  (VIBRAMYCIN ) 100 MG capsule Take 1 capsule (100 mg total) by mouth 2 (two) times daily for 7 days.   escitalopram  (LEXAPRO ) 10 MG tablet Take 10 mg by mouth daily.   estradiol  (ESTRACE ) 0.5 MG tablet Take 1 tablet (0.5 mg total) by mouth daily.   fluticasone  (FLONASE ) 50 MCG/ACT nasal spray Place 2 sprays into both nostrils daily as needed for allergies or rhinitis.   fluticasone -salmeterol (WIXELA INHUB) 250-50 MCG/ACT AEPB Inhale 1 puff into the lungs in the morning and at bedtime.   glucose blood (ACCU-CHEK GUIDE TEST) test strip Use as instructed to check blood sugar daily   hyoscyamine  (LEVSIN ) 0.125 MG tablet TAKE ONE  TABLET BY MOUTH EVERY 6 HOURS AS NEEDED FOR STOMACH CRAMPINGS   ipratropium (ATROVENT ) 0.06 % nasal spray Place 2 sprays into both nostrils 4 (four) times daily.   Ipratropium-Albuterol  (COMBIVENT  RESPIMAT) 20-100 MCG/ACT AERS respimat INHALE 1 PUFF INTO THE LUNGS EVERY 4 HOURS AS NEEDED FOR WHEEZING.   levocetirizine (XYZAL ) 5 MG tablet TAKE 1 TABLET BY MOUTH EVERY DAY IN THE EVENING   montelukast  (SINGULAIR ) 10 MG tablet TAKE 1 TABLET BY MOUTH EVERYDAY AT BEDTIME   Multiple Vitamin (MULTIVITAMIN WITH MINERALS) TABS tablet Take 2 tablets by  mouth daily.   Multiple Vitamins-Minerals (VITAFUSION MULTI WOMENS PO) Take by mouth.   nortriptyline  (PAMELOR ) 25 MG capsule TAKE ONE CAPSULE AT BEDTIME FOR MIGRANE PREVENTION AND IRRITABLE BOWEL SYNDROME   ondansetron  (ZOFRAN -ODT) 4 MG disintegrating tablet Take 1 tablet (4 mg total) by mouth every 8 (eight) hours as needed for nausea or vomiting.   OXYGEN  Inhale 2 L into the lungs at bedtime as needed.   Probiotic Product (PROBIOTIC DAILY PO) Take by mouth.   RYALTRIS 665-25 MCG/ACT SUSP Place into both nostrils.   Spacer/Aero-Holding Chambers (AEROCHAMBER PLUS) inhaler Use with inhaler   UBRELVY  50 MG TABS TAKE 50 MG BY MOUTH ONCE AS NEEDED FOR UP TO 1 DOSE (MIGRAINES, HEADACHES).   VENTOLIN  HFA 108 (90 Base) MCG/ACT inhaler Inhale 2 puffs into the lungs every 6 (six) hours as needed for wheezing or shortness of breath.   zolpidem  (AMBIEN ) 10 MG tablet TAKE 1 TABLET BY MOUTH EVERYDAY AT BEDTIME   aspirin EC 81 MG tablet Take by mouth. (Patient not taking: Reported on 11/10/2023)   bacitracin  ointment Apply 1 Application topically 2 (two) times daily. (Patient not taking: Reported on 11/10/2023)   clotrimazole -betamethasone  (LOTRISONE ) cream Apply to affected area 2 times daily prn (Patient not taking: Reported on 11/10/2023)   hydrocortisone  (ANUSOL -HC) 25 MG suppository Place 1 suppository (25 mg total) rectally 2 (two) times daily. (Patient not taking: Reported on 11/10/2023)   linaclotide  (LINZESS ) 145 MCG CAPS capsule Take 1 capsule (145 mcg total) by mouth daily before breakfast. (Patient not taking: Reported on 11/10/2023)   methocarbamol  (ROBAXIN ) 500 MG tablet Take 1 tablet (500 mg total) by mouth every 8 (eight) hours as needed for muscle spasms. (Patient not taking: Reported on 11/10/2023)   MISCELLANEOUS VAGINAL PRODUCTS VA Place vaginally. (Patient not taking: Reported on 11/10/2023)   naproxen  (NAPROSYN ) 500 MG tablet TAKE 1 TABLET (500 MG TOTAL) BY MOUTH 2 (TWO) TIMES DAILY WITH A MEAL.  AS NEEDED FOR PAIN (Patient not taking: Reported on 11/10/2023)   Olopatadine  HCl 0.2 % SOLN Apply 1 drop to eye daily. (Patient not taking: Reported on 11/10/2023)   pantoprazole  (PROTONIX ) 40 MG tablet TAKE 1 TABLET (40 MG TOTAL) BY MOUTH DAILY. FOR ACID REFLUX (Patient not taking: Reported on 11/10/2023)   promethazine  (PHENERGAN ) 12.5 MG tablet Take 1 tablet (12.5 mg total) by mouth every 8 (eight) hours as needed for nausea or vomiting. (Patient not taking: Reported on 11/10/2023)   promethazine -dextromethorphan (PROMETHAZINE -DM) 6.25-15 MG/5ML syrup Take 2.5 mLs by mouth 4 (four) times daily as needed for cough. (Patient not taking: Reported on 11/10/2023)   No facility-administered medications prior to visit.   Review of Systems     Objective    BP 112/64 (BP Location: Right Arm, Patient Position: Sitting, Cuff Size: Normal)   Pulse 84   Wt 145 lb 6.4 oz (66 kg)   SpO2 96%   BMI 29.37 kg/m   Physical Exam  General: Appearance:    Well developed, well nourished female in no acute distress  Eyes:    PERRL, conjunctiva/corneas clear, EOM's intact       Lungs:     Clear to auscultation bilaterally, respirations unlabored  Heart:    Normal heart rate. Normal rhythm.  2/6 blowing, holosystolic murmur at apex 2/6 high pitched, blowing, decrescendo, diastolic murmur at the left third intercostal space and lower sternal border   MS:   All extremities are intact.    Neurologic:   Awake, alert, oriented x 3. No apparent focal neurological defect.      EKG: Sinus rhythm with 1st degree AV block Left axis deviation Right  bundle branch block  Abnormal ECG     Assessment & Plan    1. Subacute cough (Primary) Worsening despite multiple rounds of antibiotic and prednisone  which she states have provided no relief.  - CT Chest High Resolution; Future - chlorpheniramine-HYDROcodone (TUSSIONEX) 10-8 MG/5ML; Take 5 mLs by mouth at bedtime as needed.  Dispense: 115 mL; Refill: 0  2.  Dyspnea, unspecified type She reports advair prescribed by Dr. Isadora has not helped at all. Continues to use albuterol  inhaler up to three times a day with minimal relief.  - CT Chest High Resolution; Future  Is scheduled for PFTs per her pulmonologist.   3. Chest pain, unspecified type  Patient developed severe LEFT upper chest pain while checking out. She was brought back to exam room and given 0.4 mg SL nitroglycerine and 81mg  aspirin. She demonstrated tenderness of left upper chest wall. She continued to have severe pain. There were no acute changes on her EKG. Differential diagnosis includes pleurisy, costochondritis, cardiac pain, vascular event and complications of old surgical wound. Her pain continued to worsen and EMS transported her to [facility] emergency room for further evaluation and pain control. SABRA Nancyann Perry, MD  [facility] [phone] (phone) [phone] (fax)  [facility]

## 2023-11-22 NOTE — Telephone Encounter (Signed)
 Patient went to Urgent Care on 10/17.  Nothing further needed.

## 2023-11-22 NOTE — ED Provider Notes (Signed)
 [facility] Provider Note    Event Date/Time   First MD Initiated Contact with Patient 11/22/23 1106     (approximate)   History   Chest pain   HPI  ANNALIA METZGER is a 58 y.o. female   who presented to the emergency department today from primary care doctor's office because of concerns for chest pain.  She was at her doctor's office today as follow-up for continued cough that has been present since August.  While at the doctor's office she started developing chest pain.  She describes it as sharp.  Located in her center chest.  This has not been accompanied by fevers.  In terms of this cough she has had for the past few months she has been on multiple rounds of antibiotics without any relief.      Physical Exam   Triage Vital Signs: ED Triage Vitals  Encounter Vitals Group     BP 11/22/23 1045 99/61     Girls Systolic BP Percentile --      Girls Diastolic BP Percentile --      Boys Systolic BP Percentile --      Boys Diastolic BP Percentile --      Pulse Rate 11/22/23 1045 81     Resp 11/22/23 1045 19     Temp 11/22/23 1045 (!) 97.4 F (36.3 C)     Temp Source 11/22/23 1045 Oral     SpO2 11/22/23 1044 99 %     Weight 11/22/23 1049 138 lb (62.6 kg)     Height 11/22/23 1049 4' 9 (1.448 m)     Head Circumference --      Peak Flow --      Pain Score 11/22/23 1047 8     Pain Loc --      Pain Education --      Exclude from Growth Chart --     Most recent vital signs: Vitals:   11/22/23 1044 11/22/23 1045  BP:  99/61  Pulse:  81  Resp:  19  Temp:  (!) 97.4 F (36.3 C)  SpO2: 99% 97%   General: Awake, alert, oriented. CV:  Good peripheral perfusion. Regular rate and rhythm. Resp:  Normal effort. Lungs clear. Occasional dry cough. Abd:  No distention.  Other:  No lower extremity edema.    ED Results / Procedures / Treatments   Labs (all labs ordered are listed, but only abnormal results are displayed) Labs Reviewed  CBC -  Abnormal; Notable for the following components:      Result Value   WBC 11.6 (*)    All other components within normal limits  BASIC METABOLIC PANEL WITH GFR  TROPONIN I (HIGH SENSITIVITY)  TROPONIN I (HIGH SENSITIVITY)     EKG  I, Guadalupe Eagles, attending physician, personally viewed and interpreted this EKG  EKG Time: 1045 Rate: 76 Rhythm: sinus rhythm with 1st degree av block Axis: left axis deviation Intervals: qtc 468 QRS: RBBB ST changes: no st elevation Impression: abnormal ekg  RADIOLOGY I independently interpreted and visualized the CTAPE. My interpretation: No PE Radiology interpretation: IMPRESSION:  1. No pulmonary embolism.  2. Increased heterogeneous ground-glass opacity since 04/15/2023. Differential  considerations include viral pneumonia, hypersensitivity pneumonitis, and drug  toxicity.  3. Similar saccular ascending aortic aneurysm; recommend CTA follow-up at 1  year.  4. Pulmonary artery enlargement suggests pulmonary arterial hypertension.  5. Aortic atherosclerosis ([id number]-i70.0)    PROCEDURES:  Critical Care performed: No  MEDICATIONS ORDERED IN ED: Medications - No data to display   IMPRESSION / MDM / ASSESSMENT AND PLAN / ED COURSE  I reviewed the triage vital signs and the nursing notes.                              Differential diagnosis includes, but is not limited to, PE, pneumonia, viral illness, costochondritis, acid reflux  Patient's presentation is most consistent with acute presentation with potential threat to life or bodily function.  Patient presents to the emergency department today because of concerns for continued cough and chest pain.  On exam patient does have a dry cough.  Lungs are clear.  Did obtain a CT angio given she has had chest x-rays already.  This did not show PE, did raise concern for possible pneumonitis.  No evidence of bacterial infection.  Will plan on discharging with prescription for steroids cough  medicine.  Additionally will give prescription for antiacid in case acid reflux is playing a role in the cough.  Patient will continue workup with primary care.      FINAL CLINICAL IMPRESSION(S) / ED DIAGNOSES   Final diagnoses:  Pleurisy  Cough, unspecified type      Note:  This document was prepared using Dragon voice recognition software and may include unintentional dictation errors.    Floy Roberts, MD 11/22/23 [phone]

## 2023-11-22 NOTE — ED Triage Notes (Addendum)
 Pt arrives via [facility] from PCP's office where they were being seen for their cough. Pt was Dx with pneumonia 2 mo ago and had a CXR done on Friday night and was told that they still have pneumonia. Pt states that they chest pain started today when they cough and is in the center of their chest. Per EMS pt's has some SOB and dizziness, VSS.  Pt is A&Ox4 during triage.  Pt was given ASA and 1 nitroglycerin tablet at PCP.

## 2023-11-22 NOTE — Telephone Encounter (Signed)
Was sent to wrong office

## 2023-11-22 NOTE — Addendum Note (Signed)
 Addended by: GASPER NANCYANN BRAVO on: 11/22/2023 03:20 PM   Modules accepted: Orders

## 2023-11-22 NOTE — Telephone Encounter (Signed)
 Copied from CRM #[id number]. Topic: Clinical - Medication Question >> Nov 22, 2023  3:39 PM Erin Richards wrote: Reason for CRM: Patient is calling to report that chlorpheniramine-HYDROcodone (TUSSIONEX) 10-8 MG/5ML [[id number]] /patient can't take codine  Requesting promethazine -dextromethorphan (PROMETHAZINE -DM) 6.25-15 MG/5ML syrup [[id number]]  [id number] - [place], [place] - [address] [place] [address] Phone: [phone] Fax: [phone] Hours: Not open 24 hours

## 2023-11-22 NOTE — Telephone Encounter (Signed)
 See Triage Note from 10/17, the patient called in and was told to go to Urgent Care or ED. The patient was see in the Urgent Care.  Nothing further needed.

## 2023-11-22 NOTE — Patient Instructions (Signed)
 SABRA  Please review the attached list of medications and notify my office if there are any errors.   . Please bring all of your medications to every appointment so we can make sure that our medication list is the same as yours.

## 2023-11-22 NOTE — Telephone Encounter (Signed)
 FYI Only or Action Required?: FYI only for provider.  Patient was last seen in primary care on 11/05/2023 by Gasper Nancyann BRAVO, MD.  Called Nurse Triage reporting Shortness of Breath.  Symptoms began end of August and worsening.  Interventions attempted: Prescription medications: ABX.  Symptoms are: gradually worsening.  Triage Disposition: See HCP Within 4 Hours (Or PCP Triage)  Patient/caregiver understands and will follow disposition?: Yes     Copied from CRM #[id number]. Topic: Clinical - Red Word Triage >> Nov 22, 2023  8:00 AM Charlet HERO wrote: Red Word that prompted transfer to Nurse Triage: Patient is stating that she went to Urgent care on Friday and the results are still the same she is stating that she cannot breathe that it is getting worse she was informed to setup appt for today with Dr Gasper [facility] Reason for Disposition  [1] MILD difficulty breathing (e.g., minimal/no SOB at rest, SOB with walking, pulse < 100) AND [2] NEW-onset or WORSE than normal  Answer Assessment - Initial Assessment Questions 1. RESPIRATORY STATUS: Describe your breathing? (e.g., wheezing, shortness of breath, unable to speak, severe coughing)      SOB, sever coughing, productive with greenish sputum,  2. ONSET: When did this breathing problem begin?      End of August  and worsening 3. PATTERN Does the difficult breathing come and go, or has it been constant since it started?      constant 4. SEVERITY: How bad is your breathing? (e.g., mild, moderate, severe)      Moderate to severe 5. RECURRENT SYMPTOM: Have you had difficulty breathing before? If Yes, ask: When was the last time? and What happened that time?      na 6. CARDIAC HISTORY: Do you have any history of heart disease? (e.g., heart attack, angina, bypass surgery, angioplasty)      na 7. LUNG HISTORY: Do you have any history of lung disease?  (e.g., pulmonary embolus, asthma, emphysema)     Possible asthma 8. CAUSE:  What do you think is causing the breathing problem?      Ongoing  9. OTHER SYMPTOMS: Do you have any other symptoms? (e.g., chest pain, cough, dizziness, fever, runny nose)     Lightheadedness, dizziness 10. O2 SATURATION MONITOR:  Do you use an oxygen  saturation monitor (pulse oximeter) at home? If Yes, ask: What is your reading (oxygen  level) today? What is your usual oxygen  saturation reading? (e.g., 95%)       na 11. PREGNANCY: Is there any chance you are pregnant? When was your last menstrual period?       na 12. TRAVEL: Have you traveled out of the country in the last month? (e.g., travel history, exposures)       na  Pt sounds as if having a difficult time with severe coughing - hardly able to speak complete sentences.  At the end of coughing spell - can hear the SOB over the phone.  Scheduled pt to come in this morning to see PCP.  Pneumonia and bronchitis and ongoing since end of August and worsenging  Protocols used: Breathing Difficulty-A-AH

## 2023-11-24 ENCOUNTER — Encounter: Payer: Self-pay | Admitting: Family Medicine

## 2023-11-24 ENCOUNTER — Ambulatory Visit: Admitting: Family Medicine

## 2023-11-24 VITALS — BP 118/69 | HR 89 | Wt 147.6 lb

## 2023-11-24 DIAGNOSIS — R0781 Pleurodynia: Secondary | ICD-10-CM

## 2023-11-24 DIAGNOSIS — R918 Other nonspecific abnormal finding of lung field: Secondary | ICD-10-CM | POA: Diagnosis not present

## 2023-11-24 DIAGNOSIS — R053 Chronic cough: Secondary | ICD-10-CM | POA: Diagnosis not present

## 2023-11-24 DIAGNOSIS — I7121 Aneurysm of the ascending aorta, without rupture: Secondary | ICD-10-CM

## 2023-11-24 DIAGNOSIS — R0982 Postnasal drip: Secondary | ICD-10-CM

## 2023-11-24 DIAGNOSIS — R0602 Shortness of breath: Secondary | ICD-10-CM | POA: Diagnosis not present

## 2023-11-24 MED ORDER — TRELEGY ELLIPTA 200-62.5-25 MCG/ACT IN AEPB: 1.0000 | INHALATION_SPRAY | Freq: Every day | RESPIRATORY_TRACT | Status: DC

## 2023-11-24 MED ORDER — VENTOLIN HFA 108 (90 BASE) MCG/ACT IN AERS: 2.0000 | INHALATION_SPRAY | Freq: Four times a day (QID) | RESPIRATORY_TRACT | 3 refills | Status: AC | PRN

## 2023-11-24 MED ORDER — ESOMEPRAZOLE MAGNESIUM 40 MG PO CPDR: 40.0000 mg | DELAYED_RELEASE_CAPSULE | Freq: Every day | ORAL | 3 refills | Status: AC

## 2023-11-24 NOTE — Progress Notes (Signed)
 Established patient visit   Patient: Erin USERY   DOB: 1965/07/02   58 y.o. Female  MRN: [id number] Visit Date: 11/24/2023  Today's healthcare provider: Nancyann Perry, MD   Chief Complaint  Patient presents with   ER follow-up   Subjective    Discussed the use of AI scribe software for clinical note transcription with the patient, who gave verbal consent to proceed.  History of Present Illness   Erin Richards is a 58 year old female who presents with persistent cough and fatigue and follow up ER visit 11/22/2023 for pleuritic chest pain.   She has been experiencing a persistent cough and fatigue. Her chest pain has improved, but she continues to have low energy levels. She has been using cough syrup, which provides slight relief, along with an antibiotic and prednisone  prescribed at her ER visit. Her prednisone  regimen started with one dose yesterday, six doses today, and will continue with six doses tomorrow, tapering down over the next few days.  She was previously prescribed pantoprazole , which was discontinued at the beginning of October. Since stopping the medication, her cough has become more persistent over the last week. The cough did not significantly change immediately after stopping pantoprazole  but has worsened recently.  A CT scan revealed a Richards glass opacity in her lungs, which has been present for some time without significant change. She has a history of a small aortic aneurysm, which has been monitored. She experiences sinus drainage contributing to her cough and uses Astelin  and Flonase  nasal sprays as needed, along with Singulair  and Zyrtec  at night.  Her current medications include doxycycline , which causes nausea, and Wixela inhaler, which she uses twice daily. She also uses albuterol  as needed. She was previously on Combivent  and Qvar  but now uses Wixela and albuterol  instead with no improvement.       Medications: Outpatient Medications  Prior to Visit  Medication Sig   Accu-Chek Softclix Lancets lancets Use to check blood sugar daily   acetaminophen  (TYLENOL ) 500 MG tablet Take 500 mg by mouth every 6 (six) hours as needed.   ALPRAZolam  (XANAX ) 0.5 MG tablet Take 0.5-1 tablets (0.25-0.5 mg total) by mouth 2 (two) times daily as needed (pain attacks).   aspirin EC 81 MG tablet Take by mouth.   Azelastine  HCl 137 MCG/SPRAY SOLN PLACE 1 SPRAY INTO BOTH NOSTRILS 2 (TWO) TIMES DAILY   bacitracin  ointment Apply 1 Application topically 2 (two) times daily.   Blood Glucose Monitoring Suppl (ACCU-CHEK GUIDE) w/Device KIT Use daily to check blood sugars   buPROPion (WELLBUTRIN SR) 150 MG 12 hr tablet Take 150 mg by mouth every morning.   clonazePAM (KLONOPIN) 0.5 MG tablet Take 0.5 mg by mouth daily as needed.   clotrimazole -betamethasone  (LOTRISONE ) cream Apply to affected area 2 times daily prn   doxycycline  (VIBRAMYCIN ) 100 MG capsule Take 1 capsule (100 mg total) by mouth 2 (two) times daily for 7 days.   escitalopram  (LEXAPRO ) 10 MG tablet Take 10 mg by mouth daily.   estradiol  (ESTRACE ) 0.5 MG tablet Take 1 tablet (0.5 mg total) by mouth daily.   fluticasone  (FLONASE ) 50 MCG/ACT nasal spray Place 2 sprays into both nostrils daily as needed for allergies or rhinitis.   fluticasone -salmeterol (WIXELA INHUB) 250-50 MCG/ACT AEPB Inhale 1 puff into the lungs in the morning and at bedtime.   glucose blood (ACCU-CHEK GUIDE TEST) test strip Use as instructed to check blood sugar daily   hydrocortisone  (ANUSOL -HC)  25 MG suppository Place 1 suppository (25 mg total) rectally 2 (two) times daily.   hyoscyamine  (LEVSIN ) 0.125 MG tablet TAKE ONE TABLET BY MOUTH EVERY 6 HOURS AS NEEDED FOR STOMACH CRAMPINGS   ipratropium (ATROVENT ) 0.06 % nasal spray Place 2 sprays into both nostrils 4 (four) times daily.   levocetirizine (XYZAL ) 5 MG tablet TAKE 1 TABLET BY MOUTH EVERY DAY IN THE EVENING   linaclotide  (LINZESS ) 145 MCG CAPS capsule Take 1  capsule (145 mcg total) by mouth daily before breakfast.   methocarbamol  (ROBAXIN ) 500 MG tablet Take 1 tablet (500 mg total) by mouth every 8 (eight) hours as needed for muscle spasms.   MISCELLANEOUS VAGINAL PRODUCTS [facility] Place vaginally.   montelukast  (SINGULAIR ) 10 MG tablet TAKE 1 TABLET BY MOUTH EVERYDAY AT BEDTIME   Multiple Vitamin (MULTIVITAMIN WITH MINERALS) TABS tablet Take 2 tablets by mouth daily.   Multiple Vitamins-Minerals (VITAFUSION MULTI WOMENS PO) Take by mouth.   naproxen  (NAPROSYN ) 500 MG tablet TAKE 1 TABLET (500 MG TOTAL) BY MOUTH 2 (TWO) TIMES DAILY WITH A MEAL. AS NEEDED FOR PAIN   nortriptyline  (PAMELOR ) 25 MG capsule TAKE ONE CAPSULE AT BEDTIME FOR MIGRANE PREVENTION AND IRRITABLE BOWEL SYNDROME   Olopatadine  HCl 0.2 % SOLN Apply 1 drop to eye daily.   ondansetron  (ZOFRAN -ODT) 4 MG disintegrating tablet Take 1 tablet (4 mg total) by mouth every 8 (eight) hours as needed for nausea or vomiting.   OXYGEN  Inhale 2 L into the lungs at bedtime as needed.   predniSONE  (DELTASONE ) 10 MG tablet Take 1 tablet (10 mg total) by mouth daily. Day 1 take 6 tablets Day 2 take 6 tablets Day 3 take 5 tablets Day 4 take 5 tablets Day 5 take 4 tablets Day 6 take 4 tablets Day 7 take 3 tablets Day 8 take 3 tablets Day 9 take 2 tablets Day 10 take 2 tablets Day 11 take 1 tablet Day 12 take 1 tablet   Probiotic Product (PROBIOTIC DAILY PO) Take by mouth.   promethazine  (PHENERGAN ) 12.5 MG tablet Take 1 tablet (12.5 mg total) by mouth every 8 (eight) hours as needed for nausea or vomiting.   promethazine -dextromethorphan (PROMETHAZINE -DM) 6.25-15 MG/5ML syrup TAKE 2.5 MILLILITERS BY MOUTH 4 TIMES A DAY AS NEEDED FOR COUGH (ALLERGY TO DEXTROMETHORPHAN?)   RYALTRIS 665-25 MCG/ACT SUSP Place into both nostrils.   Spacer/Aero-Holding Chambers (AEROCHAMBER PLUS) inhaler Use with inhaler   UBRELVY  50 MG TABS TAKE 50 MG BY MOUTH ONCE AS NEEDED FOR UP TO 1 DOSE (MIGRAINES, HEADACHES).   zolpidem   (AMBIEN ) 10 MG tablet TAKE 1 TABLET BY MOUTH EVERYDAY AT BEDTIME   [DISCONTINUED] Ipratropium-Albuterol  (COMBIVENT  RESPIMAT) 20-100 MCG/ACT AERS respimat INHALE 1 PUFF INTO THE LUNGS EVERY 4 HOURS AS NEEDED FOR WHEEZING.   [DISCONTINUED] pantoprazole  (PROTONIX ) 40 MG tablet TAKE 1 TABLET (40 MG TOTAL) BY MOUTH DAILY. FOR ACID REFLUX   [DISCONTINUED] pantoprazole  (PROTONIX ) 40 MG tablet Take 1 tablet (40 mg total) by mouth daily.   VENTOLIN  HFA 108 (90 Base) MCG/ACT inhaler Inhale 2 puffs into the lungs every 6 (six) hours as needed for wheezing or shortness of breath.   chlorpheniramine-HYDROcodone (TUSSIONEX) 10-8 MG/5ML Take 5 mLs by mouth at bedtime as needed.   [DISCONTINUED] benzonatate  (TESSALON  PERLES) 100 MG capsule Take 1 capsule (100 mg total) by mouth 3 (three) times daily as needed for cough.   No facility-administered medications prior to visit.   Review of Systems  Constitutional:  Negative for appetite change, chills, fatigue and  fever.  Respiratory:  Negative for chest tightness and shortness of breath.   Cardiovascular:  Negative for chest pain and palpitations.  Gastrointestinal:  Negative for abdominal pain, nausea and vomiting.  Neurological:  Negative for dizziness and weakness.       Objective    BP 118/69 (BP Location: Right Arm, Patient Position: Sitting, Cuff Size: Normal)   Pulse 89   Wt 147 lb 9.6 oz (67 kg)   SpO2 96%   BMI 31.94 kg/m   Physical Exam   General: Appearance:    Mildly obese female in no acute distress, coughing frequently throughout interview and exam.   Eyes:    PERRL, conjunctiva/corneas clear, EOM's intact       Lungs:     Clear to auscultation bilaterally, respirations unlabored  Heart:    Normal heart rate. Normal rhythm.  2/6 blowing, holosystolic murmur at apex 2/6 high pitched, blowing, decrescendo, diastolic murmur at the left third intercostal space and lower sternal border   MS:   All extremities are intact.  Mild tenderness  left upper chest wall, no gross deformities.   Neurologic:   Awake, alert, oriented x 3. No apparent focal neurological defect.          Assessment & Plan    1. Pleuritic chest pain (Primary) Much better since started on prednisone  taper at ER 2 days ago.   2. Shortness of breath refill - VENTOLIN  HFA 108 (90 Base) MCG/ACT inhaler; Inhale 2 puffs into the lungs every 6 (six) hours as needed for wheezing or shortness of breath.  Dispense: 18 g; Refill: 3  3. Chronic cough Likely multifactorial and failed several modalities of treatment including PPI, several courses of antibiotic and inhaled steroids. Will change PPI to esomeprazole (NEXIUM) 40 MG capsule; Take 1 capsule (40 mg total) by mouth daily.  Dispense: 30 capsule; Refill: 3  Try changing Wixela to samples of Fluticasone -Umeclidin-Vilant (TRELEGY ELLIPTA) 200-62.5-25 MCG/ACT AEPB; Inhale 1 puff into the lungs daily. TAKE IN PLACE OF WIXELA  4. Post-nasal drainage On nasal steroids, nasal ipratropium, antihistamines, and montelukast   5. Richards glass opacity present on imaging of lung Unclear if this is factor in her chronic cough. Copy of recent CT to her pulmonologist.   6. Aneurysm of ascending aorta without rupture Counseled that she needs repeat imagine in 1 year.      Erin Perry, MD  [facility] [phone] (phone) [phone] (fax)  [facility]

## 2023-11-24 NOTE — Patient Instructions (Signed)
 Erin Richards  Please review the attached list of medications and notify my office if there are any errors.   . Please bring all of your medications to every appointment so we can make sure that our medication list is the same as yours.

## 2023-11-25 ENCOUNTER — Other Ambulatory Visit: Payer: Self-pay
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
## 2023-11-25 NOTE — Patient Instructions (Signed)
 Visit Information  [name] was given information about Medicaid Managed Care team care coordination services as a part of their Healthy [facility] Medicaid benefit. Erin Richards   If you would like to schedule transportation through your Healthy [facility] plan, please call the following number at least 2 days in advance of your appointment: [phone]  For information about your ride after you set it up, call Ride Assist at [phone]. Use this number to activate a Will Call pickup, or if your transportation is late for a scheduled pickup. Use this number, too, if you need to make a change or cancel a previously scheduled reservation.  If you need transportation services right away, call [phone]. The after-hours call center is staffed 24 hours to handle ride assistance and urgent reservation requests (including discharges) 365 days a year. Urgent trips include sick visits, hospital discharge requests and life-sustaining treatment.  Call the [facility] Line at [phone], at any time, 24 hours a day, 7 days a week. If you are in danger or need immediate medical attention call 911.   Please see education materials related to pneumonia provided by MyChart link.  Care plan and visit instructions communicated with the patient verbally today. Patient agrees to receive a copy in MyChart. Active MyChart status and patient understanding of how to access instructions and care plan via MyChart confirmed with patient.     Telephone follow up appointment with Managed Medicaid care management team member scheduled for: 12/23/2023 at 3:30 pm  [name], MSN, RN [place]  [facility], [facility] RN Care Manager Direct Dial: [phone] Fax: [phone]   Community-Acquired Pneumonia, Adult Pneumonia is an infection of the lungs. It causes irritation and swelling in the airways of the lungs. Mucus and fluid may also build up inside the airways.  This may cause coughing and trouble breathing. One type of pneumonia can happen while you are in a hospital. A different type can happen when you are not in a hospital (community-acquired pneumonia). What are the causes?  This condition is caused by germs (viruses, bacteria, or fungi). Some types of germs can spread from person to person. Pneumonia is not thought to spread from person to person. What increases the risk? You have a long-term (chronic) disease, such as: Disease of the lungs. This may be chronic obstructive pulmonary disease (COPD) or asthma. Heart failure. Cystic fibrosis. Diabetes. Kidney disease. Sickle cell disease. HIV. You have other health problems, such as: Your body's defense system (immune system) is weak. A condition that may cause you to breathe in fluids from your mouth and nose. You had your spleen taken out. You do not take good care of your teeth and mouth (poor dental hygiene). You use or have used tobacco products. You go where the germs that cause this illness are common. You are older than 56 years of age. What are the signs or symptoms? A cough. A fever. Sweating or chills. Chest pain, often when you breathe deeply or cough. Breathing problems, such as: Fast breathing. Trouble breathing. Shortness of breath. Feeling tired (fatigued). Muscle aches. How is this treated? Treatment for this condition depends on many things, such as: The cause of your illness. Your medicines. Your other health problems. Most adults can be treated at home. Sometimes, treatment must happen in a hospital. Treatment may include medicines to kill germs. Medicines may depend on which germ caused your illness. Very bad pneumonia is rare. If you get it, you may: Have a  machine to help you breathe. Have fluid taken away from around your lungs. Follow these instructions at home: Medicines Take over-the-counter and prescription medicines only as told by your  doctor. Take cough medicine only if you are losing sleep. Cough medicine can keep your body from taking mucus away from your lungs. If you were prescribed antibiotics, take them as told by your doctor. Do not stop taking them even if you start to feel better. Lifestyle     Do not smoke or use any products that contain nicotine or tobacco. If you need help quitting, ask your doctor. Do not drink alcohol. Eat a healthy diet. This includes a lot of vegetables, fruits, whole grains, low-fat dairy products, and low-fat (lean) protein. General instructions  Rest a lot. Sleep for at least 8 hours each night. Sleep with your head and neck raised. Put a few pillows under your head or sleep in a reclining chair. Return to your normal activities as told by your doctor. Ask your doctor what activities are safe for you. Drink enough fluid to keep your pee (urine) pale yellow. If your throat is sore, gargle with a mixture of salt and water 3-4 times a day or as needed. To make salt water, completely dissolve -1 tsp (3-6 g) of salt in 1 cup (237 mL) of warm water. Keep all follow-up visits. How is this prevented? Getting the pneumonia shot (vaccine). These shots have different types and schedules. Ask your doctor what works best for you. Think about getting this shot if: You are older than 56 years of age. You are 2-21 years of age and: You are being treated for cancer. You have long-term lung disease. You have other problems that affect your body's defense system. Ask your doctor if you have one of these. Getting your flu shot every year. Ask your doctor which type of shot is best for you. Going to the dentist as often as told. Washing your hands often with soap and water for at least 20 seconds. If you cannot use soap and water, use hand sanitizer. Contact a doctor if: You have a fever. You lose sleep because your cough medicine does not help. Get help right away if: You are short of breath and  this gets worse. You have more chest pain. Your sickness gets worse. This is very serious if: You are an older adult. Your body's defense system is weak. You cough up blood. These symptoms may be an emergency. Get help right away. Call 911. Do not wait to see if the symptoms will go away. Do not drive yourself to the hospital. Summary Pneumonia is an infection of the lungs. Community-acquired pneumonia affects people who have not been in the hospital. Certain germs can cause this infection. This condition may be treated with medicines that kill germs. For very bad pneumonia, you may need a hospital stay and treatment to help with breathing. This information is not intended to replace advice given to you by your health care provider. Make sure you discuss any questions you have with your health care provider. Document Revised: 03/19/2021 Document Reviewed: 03/19/2021 Elsevier Patient Education  2024 [employer].  Following is a copy of your plan of care:  There are no care plans that you recently modified to display for this patient.

## 2023-11-26 ENCOUNTER — Ambulatory Visit: Payer: Self-pay

## 2023-11-26 ENCOUNTER — Telehealth: Payer: Self-pay | Admitting: Family Medicine

## 2023-11-26 ENCOUNTER — Other Ambulatory Visit: Payer: Self-pay | Admitting: Family Medicine

## 2023-11-26 DIAGNOSIS — J302 Other seasonal allergic rhinitis: Secondary | ICD-10-CM

## 2023-11-26 NOTE — Telephone Encounter (Signed)
 I received a call from [facility] Nurse to schedule Erin Richards for today. I informed the nurse that we do not have any availability for today with any providers that are in office.

## 2023-11-26 NOTE — Telephone Encounter (Signed)
 Duplicate CRM, see other nurse triage encounter

## 2023-11-26 NOTE — Telephone Encounter (Signed)
 FYI Only or Action Required?: Action required by provider: update on patient condition.  Patient is followed in Pulmonology for asthma, last seen on 11/10/2023 by Isadora Hose, MD.  Called Nurse Triage reporting Abdominal Pain, Shortness of Breath, and Cough.  Symptoms began several weeks ago.  Interventions attempted: Prescription medications: doxycycline , steroids.  Symptoms are: gradually worsening.  Triage Disposition: See HCP Within 4 Hours (Or PCP Triage)  Patient/caregiver understands and will follow disposition?: Unsure         Reason for Disposition  [1] MODERATE weakness (e.g., interferes with work, school, normal activities) AND [2] cause unknown  (Exceptions: Weakness from acute minor illness or poor fluid intake; weakness is chronic and not worse.)    PT already triaged by [facility] PCP and was instructed to follow up with [facility]. PCP disposition was to ED and Triager noted that the only sx that was not noted in previous triage was fatigue.  Pt reports that she has recently finished abx and currently finishing steroid taper. Due to [facility] early closures on Fridays, this Triager reinforced PCP dispo to UC/ED. This triager strongly reinforced that worsening sx after finishing abx therapy could warrant further evaluation/more invasive therapy that could only be provided in a hospital setting.  Answer Assessment - Initial Assessment Questions 1. DESCRIPTION: Describe how you are feeling.     Weak 2. SEVERITY: How bad is it?  Can you stand and walk?     Yes is able to walk 3. ONSET: When did these symptoms begin? (e.g., hours, days, weeks, months)     Last night 4. CAUSE: What do you think is causing the weakness or fatigue? (e.g., not drinking enough fluids, medical problem, trouble sleeping)     Is able to drink fluids 5. NEW MEDICINES:  Have you started on any new medicines recently? (e.g., opioid pain medicines, benzodiazepines, muscle relaxants,  antidepressants, antihistamines, neuroleptics, beta blockers)     See alternate assessment 6. OTHER SYMPTOMS: Do you have any other symptoms? (e.g., chest pain, fever, cough, SOB, vomiting, diarrhea, bleeding, other areas of pain)     See alternate assessment 7. PREGNANCY: Is there any chance you are pregnant? When was your last menstrual period?     N/a  Protocols used: Weakness (Generalized) and Fatigue-A-AH

## 2023-11-26 NOTE — Telephone Encounter (Signed)
 FYI Only or Action Required?: Action required by provider: request for appointment and clinical question for provider.  Patient was last seen in primary care on 11/24/2023 by Gasper Nancyann BRAVO, MD.  Called Nurse Triage reporting Abdominal Pain.  Symptoms began today.  Interventions attempted: Nothing.  Symptoms are: unchanged.  Triage Disposition: See HCP Within 4 Hours (Or PCP Triage)  Patient/caregiver understands and will follow disposition?: No, wishes to speak with PCP   Copied from CRM [id number]. Topic: Clinical - Red Word Triage >> Nov 26, 2023 11:22 AM Hadassah PARAS wrote: Red Word that prompted transfer to Nurse Triage: feeling nauseated, coughing up green stuff, abdominalpain Reason for Disposition  [1] MILD-MODERATE pain AND [2] constant AND [3] present > 2 hours  Answer Assessment - Initial Assessment Questions No available appts today. Advised ED now.   Patient reports will see if someone will drive to ED. Nurse offered to call 911, patient declined.  1. LOCATION: Where does it hurt?      Belly button area pain, dry heaving 2. RADIATION: Does the pain shoot anywhere else? (e.g., chest, back)     Cough goes to back, pneumonia currently,  3. ONSET: When did the pain begin? (e.g., minutes, hours or days ago)     today  5. PATTERN Does the pain come and go, or is it constant?     7/10; constant 6. SEVERITY: How bad is the pain?  (e.g., Scale 1-10; mild, moderate, or severe)     7/10 8. CAUSE: What do you think is causing the stomach pain? (e.g., gallstones, recent abdominal surgery)     no 9. RELIEVING/AGGRAVATING FACTORS: What makes it better or worse? (e.g., antacids, bending or twisting motion, bowel movement)     no 10. OTHER SYMPTOMS: Do you have any other symptoms? (e.g., back pain, diarrhea, fever, urination pain, vomiting)       Denies fever, faint, dizziness, chest pain Reports abdominal pain, chills, nausea Doxycline completed today, still  taking prednisone  Last BM 2 days ago; normal stool  Protocols used: Abdominal Pain - Female-A-AH

## 2023-11-26 NOTE — Telephone Encounter (Signed)
 Called [facility], spoke with Reena, Regarding work in visit. Reena reports Dr.Fisher out of office today and no available appts until Monday.

## 2023-11-27 DIAGNOSIS — Z1152 Encounter for screening for COVID-19: Secondary | ICD-10-CM | POA: Diagnosis not present

## 2023-11-27 DIAGNOSIS — F419 Anxiety disorder, unspecified: Secondary | ICD-10-CM | POA: Diagnosis not present

## 2023-11-27 DIAGNOSIS — I4510 Unspecified right bundle-branch block: Secondary | ICD-10-CM | POA: Diagnosis not present

## 2023-11-27 DIAGNOSIS — Z8774 Personal history of (corrected) congenital malformations of heart and circulatory system: Secondary | ICD-10-CM | POA: Diagnosis not present

## 2023-11-27 DIAGNOSIS — J329 Chronic sinusitis, unspecified: Secondary | ICD-10-CM | POA: Diagnosis not present

## 2023-11-27 DIAGNOSIS — Z881 Allergy status to other antibiotic agents status: Secondary | ICD-10-CM | POA: Diagnosis not present

## 2023-11-27 DIAGNOSIS — Z79899 Other long term (current) drug therapy: Secondary | ICD-10-CM | POA: Diagnosis not present

## 2023-11-27 DIAGNOSIS — I509 Heart failure, unspecified: Secondary | ICD-10-CM | POA: Diagnosis not present

## 2023-11-27 DIAGNOSIS — I50812 Chronic right heart failure: Secondary | ICD-10-CM | POA: Diagnosis not present

## 2023-11-27 DIAGNOSIS — Z886 Allergy status to analgesic agent status: Secondary | ICD-10-CM | POA: Diagnosis not present

## 2023-11-27 DIAGNOSIS — R0789 Other chest pain: Secondary | ICD-10-CM | POA: Diagnosis not present

## 2023-11-27 DIAGNOSIS — J45901 Unspecified asthma with (acute) exacerbation: Secondary | ICD-10-CM | POA: Diagnosis not present

## 2023-11-27 DIAGNOSIS — F349 Persistent mood [affective] disorder, unspecified: Secondary | ICD-10-CM | POA: Diagnosis not present

## 2023-11-27 DIAGNOSIS — J449 Chronic obstructive pulmonary disease, unspecified: Secondary | ICD-10-CM | POA: Diagnosis not present

## 2023-11-27 DIAGNOSIS — F32A Depression, unspecified: Secondary | ICD-10-CM | POA: Diagnosis not present

## 2023-11-27 DIAGNOSIS — R053 Chronic cough: Secondary | ICD-10-CM | POA: Diagnosis not present

## 2023-11-27 DIAGNOSIS — I272 Pulmonary hypertension, unspecified: Secondary | ICD-10-CM | POA: Diagnosis not present

## 2023-11-27 DIAGNOSIS — R0600 Dyspnea, unspecified: Secondary | ICD-10-CM | POA: Diagnosis not present

## 2023-11-27 DIAGNOSIS — I082 Rheumatic disorders of both aortic and tricuspid valves: Secondary | ICD-10-CM | POA: Diagnosis not present

## 2023-11-27 DIAGNOSIS — Z888 Allergy status to other drugs, medicaments and biological substances status: Secondary | ICD-10-CM | POA: Diagnosis not present

## 2023-11-27 DIAGNOSIS — Z885 Allergy status to narcotic agent status: Secondary | ICD-10-CM | POA: Diagnosis not present

## 2023-11-27 DIAGNOSIS — R0902 Hypoxemia: Secondary | ICD-10-CM | POA: Diagnosis not present

## 2023-11-27 DIAGNOSIS — R918 Other nonspecific abnormal finding of lung field: Secondary | ICD-10-CM | POA: Diagnosis not present

## 2023-11-28 DIAGNOSIS — R0789 Other chest pain: Secondary | ICD-10-CM | POA: Diagnosis not present

## 2023-11-28 DIAGNOSIS — J329 Chronic sinusitis, unspecified: Secondary | ICD-10-CM | POA: Diagnosis not present

## 2023-11-28 DIAGNOSIS — R0902 Hypoxemia: Secondary | ICD-10-CM | POA: Diagnosis not present

## 2023-11-28 DIAGNOSIS — R918 Other nonspecific abnormal finding of lung field: Secondary | ICD-10-CM | POA: Diagnosis not present

## 2023-11-29 DIAGNOSIS — R918 Other nonspecific abnormal finding of lung field: Secondary | ICD-10-CM | POA: Diagnosis not present

## 2023-11-29 DIAGNOSIS — R0789 Other chest pain: Secondary | ICD-10-CM | POA: Diagnosis not present

## 2023-11-29 DIAGNOSIS — R9389 Abnormal findings on diagnostic imaging of other specified body structures: Secondary | ICD-10-CM | POA: Diagnosis not present

## 2023-11-29 DIAGNOSIS — J9620 Acute and chronic respiratory failure, unspecified whether with hypoxia or hypercapnia: Secondary | ICD-10-CM | POA: Diagnosis not present

## 2023-11-29 DIAGNOSIS — J9601 Acute respiratory failure with hypoxia: Secondary | ICD-10-CM | POA: Diagnosis not present

## 2023-11-29 NOTE — Telephone Encounter (Signed)
 Per the patient's chart she is admitted at [facility] as of 10/25.  Nothing further needed.

## 2023-11-29 NOTE — Progress Notes (Signed)
 Established patient visit   Patient: Erin Richards   DOB: 07-17-1965   58 y.o. Female  MRN: [id number] Visit Date: 11/05/2023  Today's healthcare provider: Nancyann Perry, MD   Chief Complaint  Patient presents with   Croup    Patient states she has had continued persistent cough productive of green sputum since having pneumonia in August.  She denies smoking, admits to seasonal allergies.  She does not note any worsening of allergies this year but has been unable to rid the cough   Subjective    Discussed the use of AI scribe software for clinical note transcription with the patient, who gave verbal consent to proceed.  History of Present Illness   Erin Richards is a 58 year old female who presents with persistent cough and fatigue following pneumonia.  She has been experiencing a persistent cough and fatigue since August. Initially treated with levofloxacin  and prednisone , her symptoms showed some improvement but never fully resolved. She completed two rounds of antibiotics, each lasting seven days.  She coughs up green sputum and experiences significant fatigue, which worsens with physical activity such as mopping or walking. She describes feeling 'really, really tired' and notes that her energy levels have significantly decreased since the onset of her illness. She also experiences shortness of breath, particularly during exertion, and sometimes feels a tightness in her chest.  She uses three inhalers to manage her symptoms. No fever, chills, or sweats are present, but she has a runny nose, which she attributes to allergies. She is currently taking allergy medications.  Her fatigue has impacted her daily life, making her feel exhausted after minimal exertion and affecting her ability to work, leading to concerns from her employer. She has a known history of a heart condition, specifically a hole in her heart.       Medications: Outpatient Medications Prior to Visit   Medication Sig   Accu-Chek Softclix Lancets lancets Use to check blood sugar daily   acetaminophen  (TYLENOL ) 500 MG tablet Take 500 mg by mouth every 6 (six) hours as needed.   ALPRAZolam  (XANAX ) 0.5 MG tablet Take 0.5-1 tablets (0.25-0.5 mg total) by mouth 2 (two) times daily as needed (pain attacks).   Blood Glucose Monitoring Suppl (ACCU-CHEK GUIDE) w/Device KIT Use daily to check blood sugars   buPROPion (WELLBUTRIN SR) 150 MG 12 hr tablet Take 150 mg by mouth every morning.   clonazePAM (KLONOPIN) 0.5 MG tablet Take 0.5 mg by mouth daily as needed.   escitalopram  (LEXAPRO ) 10 MG tablet Take 10 mg by mouth daily.   estradiol  (ESTRACE ) 0.5 MG tablet Take 1 tablet (0.5 mg total) by mouth daily.   fluticasone  (FLONASE ) 50 MCG/ACT nasal spray Place 2 sprays into both nostrils daily as needed for allergies or rhinitis.   glucose blood (ACCU-CHEK GUIDE TEST) test strip Use as instructed to check blood sugar daily   hyoscyamine  (LEVSIN ) 0.125 MG tablet TAKE ONE TABLET BY MOUTH EVERY 6 HOURS AS NEEDED FOR STOMACH CRAMPINGS   ipratropium (ATROVENT ) 0.06 % nasal spray Place 2 sprays into both nostrils 4 (four) times daily.   levocetirizine (XYZAL ) 5 MG tablet TAKE 1 TABLET BY MOUTH EVERY DAY IN THE EVENING   Multiple Vitamin (MULTIVITAMIN WITH MINERALS) TABS tablet Take 2 tablets by mouth daily.   Multiple Vitamins-Minerals (VITAFUSION MULTI WOMENS PO) Take by mouth.   OXYGEN  Inhale 2 L into the lungs at bedtime as needed.   Probiotic Product (PROBIOTIC DAILY PO)  Take by mouth.   RYALTRIS 665-25 MCG/ACT SUSP Place into both nostrils.   Spacer/Aero-Holding Chambers (AEROCHAMBER PLUS) inhaler Use with inhaler   UBRELVY  50 MG TABS TAKE 50 MG BY MOUTH ONCE AS NEEDED FOR UP TO 1 DOSE (MIGRAINES, HEADACHES).   zolpidem  (AMBIEN ) 10 MG tablet TAKE 1 TABLET BY MOUTH EVERYDAY AT BEDTIME   [DISCONTINUED] beclomethasone (QVAR  REDIHALER) 80 MCG/ACT inhaler Inhale 1 puff into the lungs 2 (two) times daily.    [DISCONTINUED] fluticasone -salmeterol (ADVAIR DISKUS) 250-50 MCG/ACT AEPB INHALE 1 PUFF INTO THE LUNGS 2 TIMES DAILY. RINSE MOUTH AFTER USE.   [DISCONTINUED] Ipratropium-Albuterol  (COMBIVENT  RESPIMAT) 20-100 MCG/ACT AERS respimat INHALE 1 PUFF INTO THE LUNGS EVERY 4 HOURS AS NEEDED FOR WHEEZING.   [DISCONTINUED] montelukast  (SINGULAIR ) 10 MG tablet TAKE 1 TABLET BY MOUTH EVERYDAY AT BEDTIME   [DISCONTINUED] nortriptyline  (PAMELOR ) 25 MG capsule TAKE ONE CAPSULE AT BEDTIME FOR MIGRANE PREVENTION AND IRRITABLE BOWEL SYNDROME   [DISCONTINUED] ondansetron  (ZOFRAN -ODT) 4 MG disintegrating tablet Take 1 tablet (4 mg total) by mouth every 6 (six) hours as needed for nausea or vomiting.   [DISCONTINUED] VENTOLIN  HFA 108 (90 Base) MCG/ACT inhaler Inhale 2 puffs into the lungs every 6 (six) hours as needed for wheezing or shortness of breath.   aspirin EC 81 MG tablet Take by mouth. (Patient not taking: Reported on 11/25/2023)   Azelastine  HCl 137 MCG/SPRAY SOLN PLACE 1 SPRAY INTO BOTH NOSTRILS 2 (TWO) TIMES DAILY (Patient taking differently: prn)   bacitracin  ointment Apply 1 Application topically 2 (two) times daily.   clotrimazole -betamethasone  (LOTRISONE ) cream Apply to affected area 2 times daily prn (Patient not taking: Reported on 11/25/2023)   hydrocortisone  (ANUSOL -HC) 25 MG suppository Place 1 suppository (25 mg total) rectally 2 (two) times daily.   linaclotide  (LINZESS ) 145 MCG CAPS capsule Take 1 capsule (145 mcg total) by mouth daily before breakfast.   methocarbamol  (ROBAXIN ) 500 MG tablet Take 1 tablet (500 mg total) by mouth every 8 (eight) hours as needed for muscle spasms.   MISCELLANEOUS VAGINAL PRODUCTS VA Place vaginally.   naproxen  (NAPROSYN ) 500 MG tablet TAKE 1 TABLET (500 MG TOTAL) BY MOUTH 2 (TWO) TIMES DAILY WITH A MEAL. AS NEEDED FOR PAIN (Patient not taking: Reported on 11/25/2023)   Olopatadine  HCl 0.2 % SOLN Apply 1 drop to eye daily. (Patient not taking: Reported on 11/25/2023)    promethazine  (PHENERGAN ) 12.5 MG tablet Take 1 tablet (12.5 mg total) by mouth every 8 (eight) hours as needed for nausea or vomiting.   [DISCONTINUED] pantoprazole  (PROTONIX ) 40 MG tablet TAKE 1 TABLET (40 MG TOTAL) BY MOUTH DAILY. FOR ACID REFLUX   [DISCONTINUED] promethazine -dextromethorphan (PROMETHAZINE -DM) 6.25-15 MG/5ML syrup Take 2.5 mLs by mouth 4 (four) times daily as needed for cough. (Patient not taking: Reported on 11/10/2023)   No facility-administered medications prior to visit.        Objective    BP (!) 100/54 (BP Location: Left Arm, Patient Position: Sitting, Cuff Size: Normal)   Pulse (!) 107   Temp 98.1 F (36.7 C) (Oral)   Ht 4' 11 (1.499 m)   Wt 144 lb (65.3 kg)   SpO2 100%   BMI 29.08 kg/m   Physical Exam   General: Appearance:    Well developed, well nourished female in no acute distress  Eyes:    PERRL, conjunctiva/corneas clear, EOM's intact       Lungs:     Clear to auscultation bilaterally, respirations unlabored  Heart:    Tachycardic. Normal rhythm.  2/6 blowing, holosystolic murmur at apex 2/6 high pitched, blowing, decrescendo, diastolic murmur at the left third intercostal space and lower sternal border   MS:   All extremities are intact.    Neurologic:   Awake, alert, oriented x 3. No apparent focal neurological defect.        Assessment & Plan        Chronic cough with fatigue and dyspnea Persistent cough with green sputum, fatigue, and dyspnea since August. Symptoms unresponsive to antibiotics and prednisone . Differential includes chronic bronchitis and cardiac issues due to atrial septal defect. - Order blood tests for cardiac evaluation and fatigue causes. - Refer to lung specialist for evaluation and lung function tests. - Prescribe 12-day prednisone  course. - Prescribe Augmentin  for potential infection.  Chronic bronchitis (suspected) Suspected chronic bronchitis contributing to persistent cough and fatigue. Symptoms suggest  ongoing infection or inflammation. - Prescribe Augmentin  for potential infection. - Prescribe 12-day prednisone  course for inflammation.  Pneumonia (right lung) Persistent symptoms since August despite treatment with levofloxacin  and prednisone . Possible unresolved infection or inflammation.  Atrial septal defect Known atrial septal defect possibly contributing to fatigue and dyspnea. Further evaluation needed. - Order blood tests for cardiac evaluation.  Allergic rhinitis Runny nose possibly due to seasonal allergies. She is on allergy medications.         Nancyann Perry, MD  [facility] [phone] (phone) [phone] (fax)  [facility]

## 2023-11-30 ENCOUNTER — Telehealth: Payer: Self-pay

## 2023-11-30 DIAGNOSIS — R9389 Abnormal findings on diagnostic imaging of other specified body structures: Secondary | ICD-10-CM | POA: Diagnosis not present

## 2023-11-30 DIAGNOSIS — J9601 Acute respiratory failure with hypoxia: Secondary | ICD-10-CM | POA: Diagnosis not present

## 2023-11-30 DIAGNOSIS — R0789 Other chest pain: Secondary | ICD-10-CM | POA: Diagnosis not present

## 2023-11-30 DIAGNOSIS — R918 Other nonspecific abnormal finding of lung field: Secondary | ICD-10-CM | POA: Diagnosis not present

## 2023-11-30 DIAGNOSIS — R059 Cough, unspecified: Secondary | ICD-10-CM | POA: Diagnosis not present

## 2023-11-30 DIAGNOSIS — J329 Chronic sinusitis, unspecified: Secondary | ICD-10-CM | POA: Diagnosis not present

## 2023-11-30 NOTE — Consults (Signed)
 Care Management Initial Transition Planning Assessment  Patient lives in private residence alone.  Supportive fiance in the area.  Has O2 through [employer].  No other equipment in the home. Needs nebulizer for discharge.   Type of Residence: Mailing Address:  [address] [place] [address] Contacts: Accompanied by: Significant other Patient Phone Number: [phone] (home)        Medical Provider(s): Agapito Inocente PARAS, MD Reason for Admission: Admitting Diagnosis:  CAP Past Medical History:   has a past medical history of CHF (congestive heart failure) (CMS-HCC), Heart abnormality (HHS-HCC), Inverted nipple, and Stomach disorder. Past Surgical History:   has a past surgical history that includes Cardiac surgery; Hysterectomy (5-10 years ago); and Oophorectomy (Bilateral, 5-10 years ago).  Previous admit date: N/A  Primary Insurance- Payor: [place] [employer] [place]  / Plan: [place] [employer] [place]  / Product Type: *No Product type* /  Secondary Insurance - None Prescription Coverage -  Preferred Pharmacy - [id number] [place],  - [address]  Transportation home: Quarry Manager / Social Worker assessed the patient by : Telephone conversation with family Orientation Level: Oriented X4 Functional level prior to admission: Independent Reason for referral: Discharge Planning  Contact/Decision Maker Extended Emergency Contact Information Primary Emergency Contact: [facility] Address: [address] [place] [address]          [place], [place] [address] [place] Phone: [phone] Relation: Friend Secondary Emergency Contact: Babler,Roderick Address: [address]          [place], [place] [address] [place] Phone: [phone] Relation: Son  [employer] Next of Kin / Guardian / [place] / Advance Directives    Advance Directive (Medical  Treatment) Does patient have an advance directive covering medical treatment?: Patient does not have advance directive covering medical treatment.  Health Care Decision Maker [HCDM] (Medical & Mental Health Treatment) Healthcare Decision Maker: Patient does not wish to appoint a Health Care Decision Maker at this time Information offered on HCDM, Medical & Mental Health advance directives:: Patient given information.     Readmission Information  Have you been hospitalized in the last 30 days?: No                    Did the following happen with your discharge?                                Patient Information Lives with: Spouse/significant other  Type of Residence: Private residence        Support Systems/Concerns: Significant Other  Responsibilities/Dependents at home?: No  Home Care services in place prior to admission?: No      Outpatient/Community Resources in place prior to admission: Clinic    Equipment Currently Used at Home: oxygen     Currently receiving outpatient dialysis?: No    Financial Information    Need for financial assistance?: No    Social Drivers of Health Social Drivers of Health were addressed in provider documentation.  Please refer to patient history. Social Drivers of Health   Food Insecurity: No Food Insecurity (10/15/2023)   Received from [facility]   Hunger Vital Sign   . Within the past 12 months, you worried that your food would run  out before you got the money to buy more.: Never true   . Within the past 12 months, the food you bought just didn't last and you didn't have money to get more.: Never true  Tobacco Use: Low Risk  (11/27/2023)   Patient History   . Smoking Tobacco Use: Never   . Smokeless Tobacco Use: Never   . Passive Exposure: Not on file  Transportation Needs: No Transportation Needs (10/15/2023)   Received from [facility] - Transportation   . In the past 12 months, has  lack of transportation kept you from medical appointments or from getting medications?: No   . In the past 12 months, has lack of transportation kept you from meetings, work, or from getting things needed for daily living?: No  Alcohol Use: Not At Risk (08/23/2023)   Received from [facility]   AUDIT-C   . Q1: How often do you have a drink containing alcohol?: Never   . Q2: How many drinks containing alcohol do you have on a typical day when you are drinking?: Patient does not drink   . Q3: How often do you have six or more drinks on one occasion?: Never  Housing: Not on file  Physical Activity: Sufficiently Active (10/15/2023)   Received from [facility]   Exercise Vital Sign   . On average, how many days per week do you engage in moderate to strenuous exercise (like a brisk walk)?: 7 days   . On average, how many minutes do you engage in exercise at this level?: 30 min  Utilities: At Risk (10/15/2023)   Received from [facility] Utilities   . In the past 12 months has the electric, gas, oil, or water company threatened to shut off services in your home?: Yes  Stress: Stress Concern Present (10/15/2023)   Received from [facility] - Occupational Stress Questionnaire   . Do you feel stress - tense, restless, nervous, or anxious, or unable to sleep at night because your mind is troubled all the time - these days?: To some extent  Interpersonal Safety: Not At Risk (11/27/2023)   Interpersonal Safety   . Unsafe Where You Currently Live: No   . Physically Hurt by Anyone: No   . Abused by Anyone: No  Substance Use: Not on file (11/27/2023)  Intimate Partner Violence: Not At Risk (10/15/2023)   Received from [facility]   Humiliation, Afraid, Rape, and Kick questionnaire   . Within the last year, have you been afraid of your partner or ex-partner?: No   . Within the last year, have you been humiliated or emotionally abused in  other ways by your partner or ex-partner?: No   . Within the last year, have you been kicked, hit, slapped, or otherwise physically hurt by your partner or ex-partner?: No   . Within the last year, have you been raped or forced to have any kind of sexual activity by your partner or ex-partner?: No  Social Connections: Moderately Isolated (10/15/2023)   Received from [facility]   Social Connection and Isolation Panel   . In a typical week, how many times do you talk on the phone with family, friends, or neighbors?: More than three times a week   . How often do you get together with friends or relatives?: Twice a week   . How often do you attend church or religious services?: More than 4  times per year   . Do you belong to any clubs or organizations such as church groups, unions, fraternal or athletic groups, or school groups?: No   . How often do you attend meetings of the clubs or organizations you belong to?: Never   . Are you married, widowed, divorced, separated, never married, or living with a partner?: Widowed  Physicist, Medical Strain: Low Risk  (10/15/2023)   Received from [facility]   Overall Financial Resource Strain (CARDIA)   . How hard is it for you to pay for the very basics like food, housing, medical care, and heating?: Not very hard  Health Literacy: Adequate Health Literacy (10/15/2023)   Received from [facility]   B1300 Health Literacy   . How often do you need to have someone help you when you read instructions, pamphlets, or other written material from your doctor or pharmacy?: Never  Internet Connectivity: Not on file    Complex Discharge Information  Is patient identified as a difficult/complex discharge?: No                                       Interventions:    Discharge Needs Assessment Concerns to be Addressed: discharge planning  Clinical Risk Factors: Principal Diagnosis: Cancer, Stroke, COPD, Heart Failure, AMI, Pneumonia, Joint  Replacment  Barriers to taking medications: No  Prior overnight hospital stay or ED visit in last 90 days: No        Anticipated Changes Related to Illness: none  Equipment Needed After Discharge: respiratory supplies  Discharge Facility/Level of Care Needs: other (see comments)  Readmission Risk of Unplanned Readmission Score: UNPLANNED READMISSION SCORE: 8.59% Predictive Model Details        9% (Low)  Factor Value   Calculated 11/30/2023 12:04 33% Number of active inpatient medication orders 41   [facility] Risk of Unplanned Readmission Model 23% Active NSAID inpatient medication order present    11% ECG/EKG order present in last 6 months    8% Imaging order present in last 6 months    7% Number of ED visits in last six months 1    5% Age 17    5% Active anticoagulant inpatient medication order present    5% Active corticosteroid inpatient medication order present    1% Charlson Comorbidity Index 1    1% Current length of stay 1.043 days    1% Active ulcer inpatient medication order present    Readmitted Within the Last 30 Days? (No if blank)  Patient at risk for readmission?: No  Discharge Plan Screen findings are: Discharge planning needs identified or anticipated (Comment).  Expected Discharge Date: 12/02/2023  Expected Transfer from Critical Care:  (n/a)  Quality data for continuing care services shared with patient and/or representative?: N/A Patient and/or family were provided with choice of facilities / services that are available and appropriate to meet post hospital care needs?: N/A    Initial Assessment complete?: Yes

## 2023-11-30 NOTE — Telephone Encounter (Signed)
 I have requested her CT images from [facility].

## 2023-11-30 NOTE — Care Plan (Signed)
 Erin Richards office at [phone]. Spoke w/ Erin Richards. Referral accepted for delivery of nebulizer. Prescription and referral documentation secured via Epic. Erin Richards November 30, 2023 1:56 PM

## 2023-11-30 NOTE — Consults (Addendum)
 General Pulmonary Team Follow Up Consult Note   Date of Service: 11/30/2023 Requesting Physician: Mabel DELENA Hives, MD  Requesting Service: Med [facility] Teaching (MDM) Reason for consultation: Comprehensive evaluation of abnormal thoracic imaging and dyspnea.Known prior ASD s/p surgical repair at age 58.   Hospital Problems: Principal Problem:   Hypoxia Active Problems:   Cough   History of repair of congenital atrial septal defect (ASD)   Ground glass opacity present on imaging of lung   HPI: Erin Richards is a 58 y.o. female with History of ASD that was surgically repaired at age 5, prior hospitalization about 10 years ago for hypoxemic respiratory failure for which she was started on home oxygen  after, intubation in 2014 due to medication induced confusion and now presents with worsening cough and shortness of breath over the past 3 months.  History obtained from the patient.  Was doing well with no recent pulmonary symptoms until August when her significant other developed pneumonia or bronchitis.  She then started to have increasing cough and chest congestion.  Endorses chills but no fevers that she is aware of.  About a week ago cough and chills worsened.  She also developed significant fatigue at that point.  Denies any lower extremity edema although her ankles are slightly swollen.  She been able to sleep stably on 1-2 pillows but she does feel more comfortable when she is sitting upright.  Some dizziness over the past week but no syncopal or presyncopal events.  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  She has 2 dogs but no birds.  She had distant exposure to [place] as a child.  No mold damage or water damage that she is aware of.  Has lived in her current home for 3 years.  No history of connective tissue disease.  Since onset of symptoms in August, she has had multiple courses of steroids and  antibiotics.  She feels better while on therapy but then worsens off of it.  She has also been started on inhalers initially Wixela and then Advair in about a week prior to admission and then on Trelegy.  Of note, she says that her pulmonary symptoms only started this summer, she has had several CT angios of her chest in the [facility] system.  The usual indications are cough or shortness of breath.  Did see Dr. Isadora at [facility] health, pulmonary, [place] clinic.  ICS/LABA started at visit on 11/10/2023.  Was seen as distantly as 2019 for similar cough complaints.  Prednisone  in August and in October.  Was on Qvar  at the time of the visit.  Antibiotics: -Doxycycline  x 7 days October 17 - Augmentin  x 10 days on October 3 - Azithromycin  in March 2025 and July 2025 - Cefdinir  in July 20 25 x 10 days  Prednisone  - 12 days on October 3 and November 22, 2023 - 6 days on September 13, 2023  Problems addressed during this consult include acute on chronic respiratory failure and abnormal chest CT imaging. Based on these problems, the patient has moderate risk of morbidity/mortality which is commensurate w their risk of management options described below in the recommendations.  Assessment    Interval Events: Feeling somewhat improved, cough is much better. Prednisone  started this morning but better before with use of regular nebulizer treatments of albuterol      Impression: The patient has made significant clinical progress since last encounter. I  personally reviewed most recent pertinent labs, imaging and micro data, from 11/30/2023. TTE with early shunt.    Interesting case with unclear etiology of her several months of shortness of breath.  Think her history may be more full than she is being able to relay at this time since she does seem to have multiple episodes in the chart where she has had worsening shortness of breath despite her history of only worsening in the last 3 months. Differential at this time  could be acute hypersensitivity pneumonitis, worsening small airways disease, pulmonary vascular disease.  She does not look volume overloaded and the significant cough component of her symptoms makes pulmonary hypertension alone much less likely.  Echo confirms similar findings to last April with borderline TAPSE and some RV pressure increase but not significantly worse. On exam she does have crackles but not significant wheezing. I discussed her case with her new local pulmonologist, Dr. Isadora, who she will see on 11/5. He is aware of the CT findings and will arrange follow up once asthma treatments are optomized. I shared our plans as below with him.    Recommendations   - Continue pred at 40 mg for 5 days followed by 30 mg for 5 days followed by 20 mg for 5 days followed by 10 mg for 5 days - If CTD labs or HSP turn positive, please let me know and I will discuss with Dr. Hardy. He also is aware of pending labs here.  - Please continue optomized inhaler therapy for airways disease with ICS/LABA (high dose breo, 200mcg) and LAMA (incruse). Should restart home Trelegy once discharged.  - please provide nebulizer, albuterol  nebs, and spacer for albuterol  HFA for her. All of those may optimize her medication delivery that will help the cough - should also have cardiology follow up for shunt noted on TTE  The recommendations outlined in this note were discussed w the primary team via epic chat. Please do not hesitate to page [phone] [facility] consult) with questions. We appreciate the opportunity to assist in the care of this patient. We will sign off at this time.  Pauline SHAUNNA Blanch, MD  Subjective & Objective   Vitals - past 24 hours Temp:  [36.6 C (97.9 F)] 36.6 C (97.9 F) Pulse:  [78-88] 78 Resp:  [18] 18 BP: (91-106)/(52-53) 106/52 SpO2:  [95 %-100 %] 98 % Intake/Output I/O last 3 completed shifts: In: 935 [P.O.:935] Out: 2575 [Urine:2575]    Pertinent exam findings:  General appearance  - well appearing, well nourished, and in no distress Eyes - EOMI, PERRLA, anicteric sclerae, pink conjunctiva Heart - regular rate and rhythm, normal S1/S2, no gallops, rubs, or murmurs Chest - bilateral crackles at bases, slight. Reasonable air movement. No significant wheezes.  Extremities - No lower extremity edema, no clubbing or cyanosis   Relevant Imaging: Chest CT at [facility] reviewed -agree with impression of mosaic attenuation and central PA enlargement -unclear if this is from fluid overload/perfusion versus airways disease though   CT chests at [facility], reports good in imaging requested 03/02/2020 -Bilateral ventricle enlargement, main PA 3.5 cm - No comment about but there is motion artifact  11/02/2020, CT angio PE protocol - Indication for chest pain - Mean PA 3.4 cm, clear lungs   04/15/2023 CT angio PE protocol - Indication of upper respiratory tract infection shortness of breath - Diffuse hazy appearance of the lungs bilaterally with some mosaic density no consolidation.  11/22/2023, CT angio PE protocol - Indication  of cough in the setting of pneumonia since August - Enlarged PA at 3.3 cm - Mosaic attenuation noted again  TTE 05/13/23 CONCLUSION -------------------------------------------------------------------------------  NORMAL LEFT VENTRICULAR SYSTOLIC FUNCTION WITH MILD LVH  ESTIMATED EF: 50%, CALC EF(2D): 53%  NORMAL LA PRESSURES WITH DIASTOLIC DYSFUNCTION (GRADE 1)  NORMAL RIGHT VENTRICULAR SYSTOLIC FUNCTION  VALVULAR REGURGITATION: MODERATE AR, MILD MR, MILD PR, MODERATE TR  NO VALVULAR STENOSIS   Arterial Blood Gas:  No results for input(s): SPECTYPEART, PHART, PCO2ART, PO2ART, HCO3ART, BEART, O2SATART in the last 24 hours.   Venous Blood Gas:  No results for input(s): PHVEN, PCO2VEN, PO2VEN, HCO3VEN, BEVEN, O2SATVEN in the last 24 hours.   Cultures: No results found for: BLOOD CULTURE, BLOOD CULTURE, ROUTINE, URINE  CULTURE, COMPREHENSIVE, URINE CULTURE, COMPREHENSIVE, LOWER RESPIRATORY CULTURE WBC (10*9/L)  Date Value  11/30/2023 4.4      Other Labs: Lab Results  Component Value Date   WBC 4.4 11/30/2023   HGB 12.6 11/30/2023   HCT 36.6 11/30/2023   PLT 168 11/30/2023   Lab Results  Component Value Date   NA 143 11/30/2023   K 4.8 11/30/2023   CL 100 11/30/2023   CO2 32.7 (H) 11/30/2023   BUN 11 11/30/2023   CREATININE 0.87 11/30/2023   GLU 88 11/30/2023   CALCIUM 9.4 11/30/2023   MG 2.0 07/20/2016   Lab Results  Component Value Date   BILITOT 0.7 11/27/2023   PROT 6.8 11/27/2023   ALBUMIN 3.4 11/27/2023   ALT 9 (L) 11/27/2023   AST 15 11/27/2023   ALKPHOS 79 11/27/2023   Lab Results  Component Value Date   INR 1.03 07/20/2016   APTT 38.7 (H) 07/20/2016    Allergies & Home Medications  Personally reviewed in Epic  Continuous Infusions:  Infusions Meds[1]  Scheduled Medications:  Scheduled Medications[2]  PRN medications: PRN Medications[3]        [1] [2] . acetaminophen   1,000 mg Oral Q8H  . buPROPion  150 mg Oral Daily  . enoxaparin  (LOVENOX ) injection  40 mg Subcutaneous Q24H  . escitalopram  oxalate  10 mg Oral Daily  . fluticasone  furoate-vilanterol  1 puff Inhalation Daily (RT)  . fluticasone  propionate  2 spray Each Nare Daily  . ipratropium-albuterol   3 mL Nebulization Q4H While awake (RT)  . lidocaine   2 patch Transdermal Daily  . loratadine   10 mg Oral Daily  . montelukast   10 mg Oral Nightly  . nortriptyline   25 mg Oral Nightly  . pantoprazole   40 mg Oral Daily before breakfast  . predniSONE   40 mg Oral Daily   Followed by  . [START ON 12/05/2023] predniSONE   30 mg Oral Daily   Followed by  . [START ON 12/10/2023] predniSONE   20 mg Oral Daily   Followed by  . [START ON 12/15/2023] predniSONE   10 mg Oral Daily  . umeclidinium  1 puff Inhalation Daily (RT)  . zolpidem   5 mg Oral Nightly  [3] ALPRAZolam , aluminum-magnesium   hydroxide-simethicone , busPIRone, guaiFENesin , lidocaine , melatonin, cough/sore throat lozenge, senna, sodium chloride 

## 2023-11-30 NOTE — Unmapped External Note (Signed)
 Clarification  I have reviewed Lydiann Bonifas Dyal's chart and the following accurately represents Bibi Economos Spizzirri's respiratory status.  Acute Hypoxic Respiratory Failure was ruled out this admission  Additional Information: Borderline for respiratory failure, but no increased work of breathing noted on presentation. Tachypnea to 29 on initial presentation.  Patient is hypoxic compared to baseline with desaturation to mid-80s on 2L, but does not meet criteria for respiratory failure at this time.

## 2023-11-30 NOTE — Telephone Encounter (Signed)
 Per Dr. Isadora he spoke with Dr. Tobie.

## 2023-11-30 NOTE — Telephone Encounter (Signed)
 Dr. Tobie from [facility] would like to speak with you about this patient's CT scan. She is currently admitted at [facility].  Dr.Patel-[phone]

## 2023-11-30 NOTE — Discharge Summary (Signed)
 ------------------------------------------------------------------------------- Attestation signed by Melchor Mabel LABOR, MD at 12/01/23 1026 I saw and evaluated the patient, participating in the key portions of the service and performed my own physical examination. I reviewed the resident's note. I agree with the resident's findings and plan.       Clarification - Acute hypoxic respiratory failure ruled out, hypoxia alone.   Congenital atrial defect s/p surgery noted and clinically relevant to hospitalization - unclear if this is related to presenting symptoms. She will continue to follow up closely with pulmonology, cardiology for work up of pulmonary hypertension as well as possible reactive airway / interstitial lung disease.   -------------------------------------------------------------------------------   Physician Discharge Summary [facility] [address] [facility] [place] [place] [address] Dept: [phone] Loc: [phone]   Identifying Information:  Erin Richards 02-Dec-1965 [id number]  Primary Care Physician: Erin Inocente PARAS, MD   Code Status: Full Code  Admit Date: 11/27/2023  Discharge Date: 11/30/2023   Discharge To: Home  Discharge Service: [facility] - General Medicine Floor Team (MED M - [place])   Discharge Attending Physician: Mabel LABOR Melchor, MD  Discharge Diagnoses:  Principal Problem:   Hypoxia (POA: Yes) Active Problems:   Cough (POA: Unknown)   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 [facility] Course:  Outpatient To Do: [ ]  Ensure f/u with Pulmonology  Hospital Course: 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   Subacute vs Chronic cough  Acute  Hypoxic Respiratory Failure  Chronic Ground Glass Opacities on Imaging Chronic cough since August 2025 with ongoing green productive sputum, despite repeated treatment courses with antibiotics, steroids, and a variety of inhalers. Persistent oxygen  requirement of 2 L with desaturations while conversing without supplemental O2. Note intermittently uses oxygen  at home, has not needed at work. CT Chest 11/27/23 significant for central pulmonary artery enlargements likely due to pulmonary hypertension; mosaic attenuation pattern of the lungs may be perfusion related given likely pulmonary hypertension. Pulmonology consulted and felt Ddx could be acute hypersensitivity pneumonitis, worsening small airways disease, pulmonary vascular disease. Echo unimpressive w EF 55%, TAPSE 1.7, PASP estimated at 30. Pt improved with triple therapy inhalers and steroid regimen as follows: -- Has at home trelegy and close f/u with Pulm on 11/4. -- Continue steroid taper 10/27 - 11/17 as follows 40 mg for 5 days followed by 30 mg for 5 days followed by 20 mg for 5 days followed by 10 mg for 5 days -- CTD w/u pending  Rib Pain Secondary to Coughing Developed rib pain due to degree and frequency of coughing. Will treat with multimodal pain regimen with lidocaine  patch and tylenol .   History of Moderate AR  Moderate TR  History of ASD Repair History of cleft mitral valve repair  Underwent ASD repair at the age of 5. Noted to have moderate AR and moderate TR on echo in April 2025. Pro-BNP mildly elevated to 384 on admission.   Chronic Sinusitis -Continue montelukast  10 mg nightly -Continue home antihistamine equivalent loratadine  10 mg daily -Continue home fluticasone  nasal spray  -Ocean spray ordered  Touchbase with Outpatient Provider: Warm Handoff: Completed on 11/30/23 by Dorothyann Spates, MD  (Intern) via [facility]  Procedures: none ______________________________________________________________________ Discharge Medications:    Your Medication List     STOP taking these medications    amoxicillin  500 MG capsule Commonly known as: AMOXIL    amoxicillin -clavulanate 875-125 mg per tablet Commonly known as: AUGMENTIN        START taking these medications    fluticasone  furoate-vilanterol 200-25 mcg/dose inhaler Commonly known as: BREO ELLIPTA Inhale 1 puff in the morning. Start taking on: December 01, 2023   predniSONE  10 MG tablet Commonly known as: DELTASONE  Take 4 tablets (40 mg total) by mouth daily for 4 days, THEN 3 tablets (30 mg total) daily for 5 days, THEN 2 tablets (20 mg total) daily for 5 days, THEN 1 tablet (10 mg total) daily for 5 days. Start taking on: December 01, 2023   umeclidinium 62.5 mcg/actuation inhaler Commonly known as: INCRUSE ELLIPTA Inhale 1 puff in the morning. Start taking on: December 01, 2023       CHANGE how you take these medications    albuterol  90 mcg/actuation inhaler Commonly known as: PROVENTIL  HFA;VENTOLIN  HFA Inhale. What changed: Another medication with the same name was added. Make sure you understand how and when to take each.   albuterol  2.5 mg /3 mL (0.083 %) nebulizer solution Inhale 3 mL (2.5 mg total) by nebulization every four (4) hours as needed for wheezing or shortness of breath. What changed: You were already taking a medication with the same name, and this prescription was added. Make sure you understand how and when to take each.   ipratropium-albuteroL  18-103 mcg/actuation inhaler Commonly known as: COMBIVENT  Inhale. What changed: Another medication with the same name was removed. Continue taking this medication, and follow the directions you see here.       CONTINUE taking these medications    ALPRAZolam  0.5 MG tablet Commonly known as: XANAX  Take 0.5-1 tablets (0.25-0.5 mg total) by mouth two (2) times a  day as needed.   buPROPion 150 MG 12 hr tablet Commonly known as: Wellbutrin SR Take 1 tablet (150 mg total) by mouth in the morning.   busPIRone 5 MG tablet Commonly known as: BUSPAR Take 1 tablet (5 mg total) by mouth two (2) times a day.   cetirizine  10 MG tablet Commonly known as: ZYRTEC  Take 10 mg by mouth daily.   dicyclomine  20 mg tablet Commonly known as: BENTYL    escitalopram  oxalate 10 MG tablet Commonly known as: LEXAPRO  Take 1 tablet (10 mg total) by mouth daily. TAKE 10 MG BY MOUTH DAILY.   estradiol  0.5 MG tablet Commonly known as: ESTRACE  Take 0.5 mg by mouth.   fluticasone  propionate 50 mcg/actuation nasal spray Commonly known as: FLONASE  into each nostril.   gabapentin  300 MG capsule Commonly known as: NEURONTIN  1 po bid x 4 days, then tid thereafter.   levocetirizine 5 MG tablet Commonly known as: XYZAL  Take 1 tablet (5 mg total) by mouth every evening.   meloxicam  7.5 MG tablet Commonly known as: MOBIC  1-2 tablets by mouth daily as needed for pain   montelukast  10 mg tablet Commonly known as: SINGULAIR  Take 1 tablet (10 mg total) by mouth nightly.   nortriptyline  25 MG capsule Commonly known as: PAMELOR  Take 1 capsule (25 mg total) by mouth nightly.   pantoprazole  40 MG tablet Commonly known as: Protonix  Take 40 mg by mouth.   zolpidem  6.25 MG CR tablet Commonly known as: AMBIEN  CR Take 1 tablet (6.25 mg total) by mouth nightly.        Allergies: Aspartame,  Chlorhexidine , Clindamycin , Codeine, Dilaudid  [hydromorphone  (bulk)], Doxycycline , Fentanyl , Hydrocodone, Levaquin  [levofloxacin ], Metformin, Morphine , Moxifloxacin, Nitrofuran analogues, Oxycodone , Periguard [vit a-d3-e-aloe vera-zinc ], Phenothiazines, Quinidine-quinine analogues (cinchona alkaloids), Quinolones, Tetracyclines, Toradol  [ketorolac ], Tramadol , Yellow dock (rumex crispus), Acetaminophen -codeine, Biaxin [clarithromycin], Buprenorphine hcl, Chlorhexidine  gluconate,  Doxycycline  monohydrate, Hydrocodone-chlorpheniramine, Hydromorphone , Lincomycin hcl, Mefenamic acid, Nitrofurantoin, Oxycodone -acetaminophen , Phenylalanine, Pioglitazone, Quinidine, and Quinidine polygalacturonate ______________________________________________________________________ Pending Test Results: Pending Labs     Order Current Status   Anti-Nuclear Antibody (ANA) In process   Cyclic Citrul Peptide Antibody, IgG In process   Extractable Nuclear Antigen In process   Hypersensitivity Pneumonitis Panel In process   Lower Respiratory Culture In process   Myositis Panel In process       Most Recent Labs: All lab results last 24 hours -  Recent Results (from the past 24 hours)  Basic metabolic panel   Collection Time: 11/30/23  6:54 AM  Result Value Ref Range   Sodium 143 135 - 145 mmol/L   Potassium 4.8 3.4 - 4.8 mmol/L   Chloride 100 98 - 107 mmol/L   CO2 32.7 (H) 20.0 - 31.0 mmol/L   Anion Gap 10 5 - 14 mmol/L   BUN 11 9 - 23 mg/dL   Creatinine 9.12 9.44 - 1.02 mg/dL   BUN/Creatinine Ratio 13    eGFR CKD-EPI (2021) Female 78 >=60 mL/min/1.27m2   Glucose 88 70 - 179 mg/dL   Calcium 9.4 8.7 - 89.5 mg/dL  CBC   Collection Time: 11/30/23  6:54 AM  Result Value Ref Range   WBC 4.4 3.6 - 11.2 10*9/L   RBC 4.61 3.95 - 5.13 10*12/L   HGB 12.6 11.3 - 14.9 g/dL   HCT 63.3 65.9 - 55.9 %   MCV 79.5 77.6 - 95.7 fL   MCH 27.3 25.9 - 32.4 pg   MCHC 34.3 32.0 - 36.0 g/dL   RDW 84.5 (H) 87.7 - 84.7 %   MPV 7.2 6.8 - 10.7 fL   Platelet 168 150 - 450 10*9/L  Rheumatoid Factor   Collection Time: 11/30/23  6:55 AM  Result Value Ref Range   Rheumatoid Factor <3.5 <14.0 IU/mL   Microbiology -  Microbiology Results (last day)     Procedure Component Value Date/Time Date/Time   Lower Respiratory Culture [[id number]] Collected: 11/30/23 1143   Lab Status: In process Specimen: Sputum Expectorated Updated: 11/30/23 1146       Relevant Studies/Radiology: Echocardiogram W  Colorflow Spectral Doppler With Contrast Result Date: 11/30/2023 Patient Info Name:     Erin Richards Age:     58 years DOB:     1965/03/08 Gender:     Female MRN:     [id number] Accession #:     [id number] UN Account #:     [id number] Ht:     145 cm Wt:     67 kg BSA:     1.67 m2 BP:     91 /     53 mmHg HR:     78 bpm Exam Date:     11/30/2023 8:29 AM Admit Date:     11/27/2023 Exam Type:     ECHOCARDIOGRAM W COLORFLOW SPECTRAL DOPPLER W CONTRAST Technical Quality:     Poor Reason for Poor Study:     poor echocardiographic windows Staff Sonographer:     Cletus Daring Reading Fellow:     Trenda Fairly MD Ordering Physician:     Chiquita Mace Study Info Indications      - CT with enlarged pulmonary artery and findings  concerning for pulmonary hypertension ; Definity/Optison Procedure(s)   Complete two-dimensional, color flow and Doppler transthoracic echocardiogram is performed with contrast to opacify the left ventricle and to improve the delineation of the left ventricle endocardial borders. Ultrasound Enhancing Agent/Agitated Saline ------------------------------ UEA/Ag. Saline:     Optison Amount:     3.00 ml Existing IV Access:     Yes IV Access Condition:     patent with no signs of infiltration ------------------------------ UEA/Ag. Saline:     Agitated Saline Amount:     16.00 ml Existing IV Access:     Yes IV Access Condition:     patent with no signs of infiltration Summary   1. Technically difficult study.   2. The left ventricle is normal in size with normal wall thickness.   3. The left ventricular systolic function is normal, LVEF is visually estimated at 55-60%.   4. There is mild mitral valve regurgitation.   5. There is mild aortic regurgitation.   6. The right ventricle is mildly dilated in size, with mildly reduced systolic function.   7. 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   8. PA systolic estimated at 30 + RA pressure, IVC not well visualized to  estimate RA pressure. Left Ventricle   The left ventricle is normal in size with normal wall thickness. The left ventricular systolic function is normal, LVEF is visually estimated at 55-60%. Left ventricular diastolic function cannot be accurately assessed. No left ventricular thrombus visualized. Right Ventricle   The right ventricle is mildly dilated in size, with mildly reduced systolic function. Ventricular Septum   Abnormal ventricular septal motion consistent with RV pressure overload (systolic flattening). Left Atrium   The left atrium is normal in size. Right Atrium   The right atrium is normal in size. Atrial Septum   Prior closure of the atrial septal defect is visualized. Aortic Valve   The aortic valve is probably trileaflet with mildly thickened leaflets with normal excursion. There is mild aortic regurgitation. There is no evidence of a significant transvalvular gradient. Mitral Valve   The mitral valve leaflets are moderately thickened with normal leaflet mobility. There is mild mitral valve regurgitation. Tricuspid Valve   The tricuspid valve leaflets are normal, with normal leaflet mobility. There is mild tricuspid regurgitation. PA systolic estimated at 30 + RA pressure, IVC not well visualized to estimate RA pressure. Pulmonic Valve   Pulmonary valve is not well visualized. There is no significant pulmonic regurgitation. There is no evidence of a significant transvalvular gradient. Aorta   The aorta is normal in size in the visualized segments. Inferior Vena Cava   The IVC is not well visualized precluding the ability to accurate assess right atrial pressure. Pericardium/Pleural   There is no pericardial effusion. Other Findings   Rhythm: Sinus Rhythm. 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 Ventricles ---------------------------------------------------------------------- Name                                 Value        Normal  ---------------------------------------------------------------------- LV Dimensions 2D/MM ----------------------------------------------------------------------  IVS Diastolic Thickness (2D)                                0.8 cm       0.6-0.9 LVID Diastole (2D)  4.2 cm       3.8-5.2  LVPW Diastolic Thickness (2D)                                0.9 cm       0.6-0.9 LVID Systole (2D)                   2.4 cm       2.2-3.5 LVOT Diameter                       1.7 cm               LV Mass Index (2D Cubed)           66 g/m2         43-95  Relative Wall Thickness (2D)                                  0.42        <=0.42 RV Dimensions 2D/MM ----------------------------------------------------------------------  RV Basal Diastolic Dimension                           4.4 cm       2.5-4.1 TAPSE                               1.7 cm         >=1.7 Atria ---------------------------------------------------------------------- Name                                 Value        Normal ---------------------------------------------------------------------- LA Dimensions ---------------------------------------------------------------------- LA Dimension (2D)                   3.8 cm       2.7-3.8 LA Volume Index (4C A-L)        17.79 ml/m2               LA Volume Index (2C A-L)        21.23 ml/m2               LA Volume (BP MOD)                   31 ml               LA Volume Index (BP MOD)        18.71 ml/m2   16.00-34.00 Left Ventricular Outflow Tract ---------------------------------------------------------------------- Name                                 Value        Normal ---------------------------------------------------------------------- LVOT 2D ---------------------------------------------------------------------- LVOT Diameter                       1.7 cm               LVOT Area                          2.1 cm2  LVOT Doppler ----------------------------------------------------------------------  LVOT VTI                             29 cm               MV VTI/LVOT VTI Ratio                 1.36               LVOT Stroke Volume                   61 ml               LVOT SI                           36 ml/m2 Aortic Valve ---------------------------------------------------------------------- Name                                 Value        Normal ---------------------------------------------------------------------- AV Doppler ---------------------------------------------------------------------- AV Mean Gradient                    6 mmHg               AV VTI                               35 cm               AV Area (Cont Eq VTI)              1.8 cm2         >=3.0 AV Area Index (Cont Eq VTI)     1.1 cm2/m2               AV DI (VTI)                           0.82 Mitral Valve ---------------------------------------------------------------------- Name                                 Value        Normal ---------------------------------------------------------------------- MV Doppler ---------------------------------------------------------------------- MV Peak Velocity                   1.6 m/s               MV Mean Gradient                    5 mmHg               MV Area (Cont Eq VTI)              1.6 cm2               MV Area Index (Cont Eq VTI)     0.9 cm2/m2               MV Diastolic Function ---------------------------------------------------------------------- MV E Peak Velocity                114 cm/s               MV A Peak Velocity  153 cm/s               MV E/A                                 0.7               MV Annular TDI ---------------------------------------------------------------------- MV Septal e' Velocity            10.0 cm/s         >=8.0 MV E/e' (Septal)                      11.4               MV Lateral e' Velocity            8.9 cm/s        >=10.0 MV E/e' (Lateral)                     12.8               MV e' Average                     9.5 cm/s               MV E/e'  (Average)                     12.1 Tricuspid Valve ---------------------------------------------------------------------- Name                                 Value        Normal ---------------------------------------------------------------------- TV Regurgitation Doppler ---------------------------------------------------------------------- TR Peak Velocity                   2.7 m/s Pulmonic Valve ---------------------------------------------------------------------- Name                                 Value        Normal ---------------------------------------------------------------------- PV Doppler ---------------------------------------------------------------------- PV Peak Velocity                   1.2 m/s Aorta ---------------------------------------------------------------------- Name                                 Value        Normal ---------------------------------------------------------------------- Ascending Aorta ---------------------------------------------------------------------- Ao Root Diameter (2D)               2.6 cm               Ao Root Diam Index (2D)          1.5 cm/m2               Ascending Aorta Diameter            3.1 cm Report Signatures Resident Trenda Fairly  MD on 11/30/2023 01:58 PM  CT Chest Wo Contrast Result Date: 11/28/2023 EXAM: CT CHEST WO CONTRAST ACCESSION: [id number] UN REPORT DATE: 11/28/2023 6:45 AM CLINICAL INDICATION: Hypoxia and persistent cough for 2 months TECHNIQUE: Contiguous noncontrast axial images were reconstructed through the chest following a single breath hold helical acquisition. Images were reformatted in the axial. coronal, and  sagittal planes. MIP slabs were also constructed. COMPARISON: Chest radiograph 11/27/2023 FINDINGS: LUNGS/AIRWAYS/PLEURA: Trachea and large airways are patent. Mosaic attenuation pattern of groundglass opacification in both lungs. Bilateral lower lung linear and dependent lower lobe atelectasis. No pleural effusion or  pneumothorax. MEDIASTINUM/THORACIC INLET: No enlarged supraclavicular or intrathoracic lymph nodes. No mediastinal mass or thyroid  abnormality. HEART/VASCULATURE: Cardiac chambers are normal in size. Post-operative changes of prior ASD repair. No pericardial effusion. Normal caliber aorta with mild calcifications. Main pulmonary artery is enlarged, measuring 3.2 cm. Main pulmonary artery to ascending aorta diameter ratio is elevated at 1.2. Central pulmonary artery enlargement with central pulmonary artery branches significantly larger than their accompanying bronchus. UPPER ABDOMEN: Mildly enlarged spleen measuring 14.2 cm (Series 3, Image 390). CHEST WALL/BONES: No enlarged axillary lymph nodes. Right convex curvature and mild degenerative changes of the thoracic spine. Median sternotomy.   *  Central pulmonary artery enlargement likely due to pulmonary hypertension. *  Mosaic attenuation pattern of the lungs may be perfusion related given likely pulmonary hypertension. Component of pulmonary edema could be present. *  Mild splenomegaly.   ECG 12 Lead Result Date: 11/27/2023 NORMAL SINUS RHYTHM RIGHT BUNDLE BRANCH BLOCK LEFT ANTERIOR FASCICULAR BLOCK POOR R WAVE PROGRESSION IN PRECORDIAL LEADS LEFT VENTRICULAR HYPERTROPHY WITH REPOLARIZATION ABNORMALITY ( R in aVL ) WHEN COMPARED WITH ECG OF 20-Jul-2016 21:10, RATE HAS INCREASED AND PR INTERVAL HAS DECREASED QRS DURATION HAS INCREASED Confirmed by Vinie Dunnings ([id number]) on 11/27/2023 1:53:01 PM  XR Chest 2 views Result Date: 11/27/2023 EXAM: XR CHEST 2 VIEWS ACCESSION: [id number] UN REPORT DATE: 11/27/2023 11:47 AM CLINICAL INDICATION: DYSPNEA ; Dyspnea  TECHNIQUE: PA and Lateral Chest Radiographs COMPARISON: ADULT CHEST - 2 VIEWS 07/20/2016 FINDINGS: Patchy hazy opacities in left lower lobe. Bibasilar atelectasis.  No pleural effusion or pneumothorax. Normal heart size and mediastinal contours.   Airspace disease in left lower lobe could be infective    ______________________________________________________________________ Discharge Instructions:  Activity Instructions     Activity as tolerated           Resources and Referrals     Nebulizers     Supplies:  Pro Neb Jet Style Administration Kit Mouth Piece Face Mask     Length of Need: 99   Please provide: Nebulizer Administration Set Disposable non -filtered.  Please provide 2 per month., Disposable Neb Cup small volume non-filtered.  Please provide 2 per month., Filtered Administration Set.  Please provide 1 per month., Disposable Compressor Filter.  Please provide 2 per month., and Aerosol Mask for use with Nebulizer. Please provide 1 per month.      Please provide: Nebulizer Administration Set Disposable non -filtered.  Please provide 2 per month., Disposable Neb Cup small volume non-filtered.  Please provide 2 per month., Filtered Administration Set.  Please provide 1 per month., Disposable Compressor Filter.  Please provide 2 per month., and Aerosol Mask for use with Nebulizer. Please provide 1 per month.   Nebulizers and Supplies     AMB HME Vendor Choice? (Outside HME Orders Do Not Route): [facility]   Start of care date: 11/30/2023   Qualifying diagnosis for this order: Asthma   Nebulizer Supplies: Nebulizer compressor with standard Scientist, [employer] (medical) with Standard Kit: 2 kits per month covered, kit includes mouthpiece, cup and tubing   Length of need in months (99=Lifetime): 99        Follow Up instructions and Outpatient Referrals    Ambulatory Referral to Home Health  Reason for referral: PT and OT   Physician to follow patient's care: PCP   Disciplines requested:  Physical Therapy Occupational Therapy     Physical Therapy requested: Evaluate and treat   Occupational Therapy Requested: Evaluate and treat   Call MD for:  persistent nausea or vomiting     Call MD for:  severe uncontrolled pain     Call MD for: Temperature > 38.5  Celsius ( > 101.3 Fahrenheit)     Discharge instructions        ______________________________________________________________________ Discharge Day Services: BP 106/52   Pulse 78   Temp 36.6 C (97.9 F) (Oral)   Resp 18   Ht 144.8 cm (4' 9)   Wt 67 kg (147 lb 11.3 oz)   LMP  (LMP Unknown)   SpO2 98%   BMI 31.96 kg/m   Pt seen on the day of discharge and determined appropriate for discharge.  Condition at Discharge: good  Length of Discharge: I spent greater than 30 mins in the discharge of this patient.

## 2023-12-01 ENCOUNTER — Telehealth: Payer: Self-pay

## 2023-12-01 DIAGNOSIS — J9601 Acute respiratory failure with hypoxia: Secondary | ICD-10-CM

## 2023-12-01 DIAGNOSIS — J301 Allergic rhinitis due to pollen: Secondary | ICD-10-CM

## 2023-12-01 DIAGNOSIS — J449 Chronic obstructive pulmonary disease, unspecified: Secondary | ICD-10-CM | POA: Diagnosis not present

## 2023-12-01 DIAGNOSIS — J329 Chronic sinusitis, unspecified: Secondary | ICD-10-CM

## 2023-12-01 NOTE — Transitions of Care (Post Inpatient/ED Visit) (Signed)
 12/01/2023  Name: Erin Richards MRN: [id number] DOB: [birth year]  Today's TOC FU Call Status: Today's TOC FU Call Status:: Successful TOC FU Call Completed TOC FU Call Complete Date: 12/01/23 Patient's Name and Date of Birth confirmed.  Transition Care Management Follow-up Telephone Call Date of Discharge: 11/30/23 Discharge Facility: Other ([facility]) Name of Other ([facility]) Discharge Facility: [facility] Type of Discharge: Inpatient Admission Primary Inpatient Discharge Diagnosis:: Hypoxia How have you been since you were released from the hospital?: Better Any questions or concerns?: Yes Patient Questions/Concerns:: Not sure about some of the medications from [facility], pharmacy called and said they were working on prior auth for [employer], I think Patient Questions/Concerns Addressed: Other: (DC Summary reviewed and checked medication list)  Items Reviewed: Did you receive and understand the discharge instructions provided?: Yes Medications obtained,verified, and reconciled?: Yes (Medications Reviewed) Any new allergies since your discharge?: No Dietary orders reviewed?: NA Do you have support at home?: Yes People in Home [RPT]: significant other Name of Support/Comfort Primary Source: Did not say  Medications Reviewed Today: Medications Reviewed Today     Reviewed by Eilleen Richerd GRADE, RN (Registered Nurse) on 12/01/23 at 1418  Med List Status: <None>   Medication Order Taking? Sig Documenting Provider Last Dose Status Informant  Accu-Chek Softclix Lancets lancets [id number]  Use to check blood sugar daily Fisher, Nancyann BRAVO, MD  Active   acetaminophen  (TYLENOL ) 500 MG tablet [id number]  Take 500 mg by mouth every 6 (six) hours as needed. [provider]  Active   ALPRAZolam  (XANAX ) 0.5 MG tablet [id number] Yes Take 0.5-1 tablets (0.25-0.5 mg total) by mouth 2 (two) times daily as needed (pain attacks). Gasper Nancyann BRAVO, MD  Active   aspirin EC 81 MG tablet  [id number]  Take by mouth.  Patient not taking: Reported on 11/25/2023   [provider]  Active   Azelastine  HCl 137 MCG/SPRAY SOLN [id number]  PLACE 1 SPRAY INTO BOTH NOSTRILS 2 (TWO) TIMES DAILY  Patient taking differently: prn   Gasper Nancyann BRAVO, MD  Active            Med Note LESLY, RICHERD GRADE [place] Dec 01, 2023  2:08 PM) Taking as needed  bacitracin  ointment [id number]  Apply 1 Application topically 2 (two) times daily. Bernardino Ditch, NP  Active   Blood Glucose Monitoring Suppl (ACCU-CHEK GUIDE) w/Device KIT [id number]  Use daily to check blood sugars Gasper Nancyann BRAVO, MD  Active   buPROPion (WELLBUTRIN SR) 150 MG 12 hr tablet [id number] Yes Take 150 mg by mouth every morning. Marikay Peggye HERO, NP  Active   chlorpheniramine-HYDROcodone (TUSSIONEX) 10-8 MG/5ML [id number]  Take 5 mLs by mouth at bedtime as needed.  Patient not taking: Reported on 11/25/2023   Gasper Nancyann BRAVO, MD  Active   clonazePAM (KLONOPIN) 0.5 MG tablet [id number]  Take 0.5 mg by mouth daily as needed. [provider]  Active   clotrimazole -betamethasone  (LOTRISONE ) cream [id number]  Apply to affected area 2 times daily prn  Patient not taking: Reported on 11/25/2023   Arvis Jolan NOVAK, PA-C  Active   escitalopram  (LEXAPRO ) 10 MG tablet [id number] Yes Take 10 mg by mouth daily. Marikay Peggye HERO, NP  Active   esomeprazole (NEXIUM) 40 MG capsule [id number] Yes Take 1 capsule (40 mg total) by mouth daily. Gasper Nancyann BRAVO, MD  Active   estradiol  (ESTRACE ) 0.5 MG tablet [id number] Yes Take 1 tablet (0.5 mg total) by mouth daily. Gasper Nancyann BRAVO, MD  Active   fluticasone  (FLONASE ) 50 MCG/ACT nasal spray [id number] Yes Place 2 sprays into both nostrils daily as needed for allergies or rhinitis. Gasper Nancyann BRAVO, MD  Active   fluticasone -salmeterol GARETH INHUB) 250-50 MCG/ACT AEPB [id number]  Inhale 1 puff into the lungs in the morning and at bedtime.  Patient not taking: Reported on 11/25/2023   Isadora Hose,  MD  Active            Med Note LESLY, RICHERD CINDERELLA [place] Dec 01, 2023  2:09 PM) Taking as needed.  Fluticasone -Umeclidin-Vilant (TRELEGY ELLIPTA) 200-62.5-25 MCG/ACT AEPB [id number]  Inhale 1 puff into the lungs daily. TAKE IN PLACE OF [facility]  Patient not taking: Reported on 12/01/2023   Gasper Nancyann BRAVO, MD  Active   glucose blood (ACCU-CHEK GUIDE TEST) test strip [id number]  Use as instructed to check blood sugar daily Gasper Nancyann BRAVO, MD  Active   hydrocortisone  (ANUSOL -HC) 25 MG suppository [id number]  Place 1 suppository (25 mg total) rectally 2 (two) times daily. Emilio Marseille T, FNP  Active   hyoscyamine  (LEVSIN ) 0.125 MG tablet [id number]  TAKE ONE TABLET BY MOUTH EVERY 6 HOURS AS NEEDED FOR STOMACH CRAMPINGS Gasper Nancyann BRAVO, MD  Active   ipratropium (ATROVENT ) 0.06 % nasal spray [id number] Yes Place 2 sprays into both nostrils 4 (four) times daily. Gasper Nancyann BRAVO, MD  Active   levocetirizine (XYZAL ) 5 MG tablet [id number] Yes TAKE 1 TABLET BY MOUTH EVERY DAY IN THE KARNA Gasper Nancyann BRAVO, MD  Active   linaclotide  (LINZESS ) 145 MCG CAPS capsule [id number]  Take 1 capsule (145 mcg total) by mouth daily before breakfast.  Patient not taking: Reported on 12/01/2023   Gasper Nancyann BRAVO, MD  Active   methocarbamol  (ROBAXIN ) 500 MG tablet [id number]  Take 1 tablet (500 mg total) by mouth every 8 (eight) hours as needed for muscle spasms.  Patient not taking: Reported on 12/01/2023   Brimage, Vondra, DO  Active   MISCELLANEOUS VAGINAL PRODUCTS VA [id number]  Place vaginally. [provider]  Active   montelukast  (SINGULAIR ) 10 MG tablet [id number] Yes TAKE 1 TABLET BY MOUTH EVERYDAY AT BEDTIME Gasper Nancyann BRAVO, MD  Active   Multiple Vitamin (MULTIVITAMIN WITH MINERALS) TABS tablet [id number] Yes Take 2 tablets by mouth daily. [provider]  Active Self  Multiple Vitamins-Minerals (VITAFUSION MULTI WOMENS PO) [id number]  Take by mouth. [provider]  Active   naproxen   (NAPROSYN ) 500 MG tablet [id number]  TAKE 1 TABLET (500 MG TOTAL) BY MOUTH 2 (TWO) TIMES DAILY WITH A MEAL. AS NEEDED FOR PAIN  Patient not taking: Reported on 12/01/2023   Gasper Nancyann BRAVO, MD  Active   nortriptyline  (PAMELOR ) 25 MG capsule [id number] Yes TAKE ONE CAPSULE AT BEDTIME FOR MIGRANE PREVENTION AND IRRITABLE BOWEL SYNDROME Gasper Nancyann BRAVO, MD  Active   Olopatadine  HCl 0.2 % SOLN [id number]  Apply 1 drop to eye daily.  Patient not taking: Reported on 12/01/2023   Rumball, Alison M, DO  Active   ondansetron  (ZOFRAN -ODT) 4 MG disintegrating tablet [id number] Yes Take 1 tablet (4 mg total) by mouth every 8 (eight) hours as needed for nausea or vomiting. Corlis Burnard DEL, NP  Active   OXYGEN  [id number] Yes Inhale 2 L into the lungs at bedtime as needed. [provider]  Active Self           Med Note LESLY, RICHERD CINDERELLA [place] Dec 01, 2023  2:02 PM) Serviced by [employer]  predniSONE  (  DELTASONE ) 10 MG tablet [id number]  Take 1 tablet (10 mg total) by mouth daily. Day 1 take 6 tablets Day 2 take 6 tablets Day 3 take 5 tablets Day 4 take 5 tablets Day 5 take 4 tablets Day 6 take 4 tablets Day 7 take 3 tablets Day 8 take 3 tablets Day 9 take 2 tablets Day 10 take 2 tablets Day 11 take 1 tablet Day 12 take 1 tablet Goodman, Graydon, MD  Active            Med Note LESLY, RICHERD CINDERELLA [place] Dec 01, 2023  2:02 PM) Prednisone  will be ready 12/02/23 at [facility] per patient  Probiotic Product (PROBIOTIC DAILY PO) [id number]  Take by mouth. [provider]  Active   promethazine  (PHENERGAN ) 12.5 MG tablet [id number]  Take 1 tablet (12.5 mg total) by mouth every 8 (eight) hours as needed for nausea or vomiting.  Patient not taking: Reported on 12/01/2023   Gasper Nancyann BRAVO, MD  Active Self  promethazine -dextromethorphan (PROMETHAZINE -DM) 6.25-15 MG/5ML syrup [id number]  TAKE 2.5 MILLILITERS BY MOUTH 4 TIMES A DAY AS NEEDED FOR COUGH (ALLERGY TO DEXTROMETHORPHAN?) Gasper Nancyann BRAVO, MD  Active             Med Note LESLY, RICHERD CINDERELLA [place] Dec 01, 2023  2:16 PM) Taking only as needed  RYALTRIS 334-74 MCG/ACT SUSP [id number]  Place into both nostrils. [provider]  Active            Med Note LESLY, RICHERD CINDERELLA [place] Dec 01, 2023  2:17 PM) Taking as needed  Spacer/Aero-Holding Raguel (AEROCHAMBER PLUS) inhaler [id number] Yes Use with inhaler Mortenson, Ashley, MD  Active   UBRELVY  50 MG TABS [id number] Yes TAKE 50 MG BY MOUTH ONCE AS NEEDED FOR UP TO 1 DOSE (MIGRAINES, HEADACHES). Gasper Nancyann BRAVO, MD  Active   umeclidinium bromide (INCRUSE ELLIPTA) 62.5 MCG/ACT AEPB [id number] Yes Inhale 1 puff into the lungs daily. [provider]  Active   VENTOLIN  HFA 108 (90 Base) MCG/ACT inhaler [id number] Yes Inhale 2 puffs into the lungs every 6 (six) hours as needed for wheezing or shortness of breath. Gasper Nancyann BRAVO, MD  Active   zolpidem  (AMBIEN ) 10 MG tablet [id number] Yes TAKE 1 TABLET BY MOUTH EVERYDAY AT BEDTIME Fisher, Nancyann BRAVO, MD  Active           Patient had Gabepentin in DC summary from [facility] patient denies having or taking.  Home Care and Equipment/Supplies: Were Home Health Services Ordered?: Yes Name of Home Health Agency:: [facility] Has Agency set up a time to come to your home?: No EMR reviewed for Home Health Orders: Orders present/patient has not received call (refer to CM for follow-up) (Patient states the agency called and will have to let her know if she is accepted and would call her back) Any new equipment or medical supplies ordered?: No   Follow up appointments reviewed: PCP Follow-up appointment confirmed?: No (Patient declines follow up at this time) MD Provider Line Number:[phone] Given: No Specialist Hospital Follow-up appointment confirmed?: Yes Date of Specialist follow-up appointment?: 12/07/23 Follow-Up Specialty Provider:: Isadora Hose, MD Pulmonology Do you need transportation to your follow-up appointment?: No Do you understand care  options if your condition(s) worsen?: Yes-patient verbalized understanding  SDOH Interventions Today    Flowsheet Row Most Recent Value  SDOH Interventions   Food Insecurity Interventions Intervention Not Indicated  Housing Interventions Intervention Not Indicated  Transportation Interventions  Intervention Not Indicated  Utilities Interventions Intervention Not Indicated    Goals Addressed             This Visit's Progress    VBCI Transitions of Care (TOC) Care Plan       Problems:  Recent Hospitalization for treatment of Hypoxia patient states Allergic Rhinitis and Asthma Knowledge Deficit Related to Medications from [facility] admission, Medication management barrier has not received medications as prescribed, and No Hospital Follow Up Provider appointment patient prefers to follow up with pulmonologist and will call her PCP as needed. Home Health ordered and called patient and said the would call her back to confirm services.  Goal:  Over the next 30 days, the patient will not experience hospital readmission  Interventions:  Transitions of Care: Doctor Visits  - discussed the importance of doctor visits Communication with Centralized Pharmacy re: polypharmacy and medications needs from outside hospital  Patient Self Care Activities:  Attend all scheduled provider appointments Call pharmacy for medication refills 3-7 days in advance of running out of medications Call provider office for new concerns or questions  Notify RN Care Manager of [facility] call rescheduling needs Participate in Transition of Care Program/Attend TOC scheduled calls Take medications as prescribed   Encouraged hospital follow up with PCP for medication review   Plan:  Telephone follow up appointment with care management team member scheduled for:  12/09/23 1430 The patient has been provided with contact information for the care management team and has been advised to call with any health related questions or  concerns.  Referral to pharmacist for polypharmacy and post hospital medications from [facility] and ongoing medication management needs.       Discussed and offered 30 day TOC program.  Patient  agrees to weekly calls.  The patient has been provided with contact information for the care management team and has been advised to call with any health -related questions or concerns.  The patient verbalized understanding with current plan of care.  The patient is directed to their insurance card regarding availability of benefits coverage.    [name], RN, BSN, CCM [facility], [facility] Management Coordinator Direct Dial: [phone]

## 2023-12-01 NOTE — Patient Instructions (Addendum)
 Visit Information  Thank you for taking time to visit with me today. Please don't hesitate to contact me if I can be of assistance to you before our next scheduled telephone appointment.  Our next appointment is by telephone on 12/09/23 at 1430  Following is a copy of your care plan:   Goals Addressed             This Visit's Progress    VBCI Transitions of Care (TOC) Care Plan       Problems:  Recent Hospitalization for treatment of Hypoxia patient states Allergic Rhinitis and Asthma Knowledge Deficit Related to Medications from [facility] admission, Medication management barrier has not received medications as prescribed, and No Hospital Follow Up Provider appointment patient prefers to follow up with pulmonologist and will call her PCP as needed. Home Health ordered and called patient and said the would call her back to confirm services.  Goal:  Over the next 30 days, the patient will not experience hospital readmission  Interventions:  Transitions of Care: Doctor Visits  - discussed the importance of doctor visits Communication with Centralized Pharmacy re: polypharmacy and medications needs from outside hospital  Patient Self Care Activities:  Attend all scheduled provider appointments Call pharmacy for medication refills 3-7 days in advance of running out of medications Call provider office for new concerns or questions  Notify RN Care Manager of [facility] call rescheduling needs Participate in Transition of Care Program/Attend TOC scheduled calls Take medications as prescribed   Encouraged hospital follow up with PCP for medication review   Plan:  Telephone follow up appointment with care management team member scheduled for:  12/09/23 1430 The patient has been provided with contact information for the care management team and has been advised to call with any health related questions or concerns.  Referral to pharmacist for polypharmacy and post hospital medications from [facility] and  ongoing medication management needs.        Patient verbalizes understanding of instructions and care plan provided today and agrees to view in MyChart. Active MyChart status and patient understanding of how to access instructions and care plan via MyChart confirmed with patient.     The patient has been provided with contact information for the care management team and has been advised to call with any health related questions or concerns.  The Central Pharmacy team will follow up with the patient and will provide direct communication to the PCP for this patient.   Please call the care guide team at [phone] if you need to cancel or reschedule your appointment.   Please call the Suicide and Crisis Lifeline: 988 call the [place]  National Suicide Prevention Lifeline: 6187992811 or TTY: 706-238-9212 TTY 813-391-5318) to talk to a trained counselor call 1-800-273-TALK (toll free, 24 hour hotline) call 911 if you are experiencing a Mental Health or Behavioral Health Crisis or need someone to talk to.  [name], RN, BSN, CCM [facility], [facility] Management Coordinator Direct Dial: [phone]

## 2023-12-01 NOTE — Transitions of Care (Post Inpatient/ED Visit) (Signed)
   12/01/2023  Name: Erin Richards MRN: [id number] DOB: September 08, 1965  Today's TOC FU Call Status: Today's TOC FU Call Status:: Unsuccessful Call (1st Attempt) Unsuccessful Call (1st Attempt) Date: 12/01/23  Attempted to reach the patient regarding the most recent Inpatient/ED visit. Left a HIPAA approved voicemail message to phone number provided in demographics per DPR.    Follow Up Plan: Additional outreach attempts will be made to reach the patient to complete the Transitions of Care (Post Inpatient/ED visit) call.   [name], RN, BSN, CCM [facility], [facility] Management Coordinator Direct Dial: [phone]

## 2023-12-01 NOTE — Care Plan (Signed)
 [facility] at [phone].  Spoke w/ Annia. Referral accepted for home health [facility]. Home health referral and referral documentation pulled directly from EPIC. Toribio DELENA Kern December 01, 2023 8:29 AM

## 2023-12-02 ENCOUNTER — Inpatient Hospital Stay
Admission: RE | Admit: 2023-12-02 | Discharge: 2023-12-02 | Disposition: A | Payer: Self-pay | Source: Ambulatory Visit | Attending: Student in an Organized Health Care Education/Training Program

## 2023-12-02 ENCOUNTER — Other Ambulatory Visit: Payer: Self-pay

## 2023-12-02 ENCOUNTER — Telehealth: Payer: Self-pay

## 2023-12-02 DIAGNOSIS — Z9289 Personal history of other medical treatment: Secondary | ICD-10-CM

## 2023-12-02 NOTE — Progress Notes (Signed)
 Care Guide Pharmacy Note  12/02/2023 Name: Erin Richards MRN: [id number] DOB: 1965-09-29  Referred By: Gasper Nancyann BRAVO, MD Reason for referral: Complex Care Management, Call Attempt #1 (Unsuccessful initial outreach to schedule with PHARM D- Allyson), and Call Attempt #2 (Successful initial outreach scheduled with PHARM D-Allyson)   Erin Richards is a 58 y.o. year old female who is a primary care patient of Gasper, Nancyann BRAVO, MD.  Erin Richards was referred to the pharmacist for assistance related to: Allergic Rhinitus  Successful contact was made with the patient to discuss pharmacy services including being ready for the pharmacist to call at least 5 minutes before the scheduled appointment time and to have medication bottles and any blood pressure readings ready for review. The patient agreed to meet with the pharmacist via In office  on (date/time). 12/15/23 @ 9 AM  Leotis Cloria Davene Salome Romell Ralph Valrie, [facility] Guide  Direct Dial: [phone]  Fax [phone]

## 2023-12-02 NOTE — Progress Notes (Signed)
 Care Guide Pharmacy Note  12/02/2023 Name: MARG MACMASTER MRN: [id number] DOB: 1965-03-07  Referred By: Gasper Nancyann BRAVO, MD Reason for referral: Complex Care Management and Call Attempt #1 (Unsuccessful initial outreach to schedule with PHARM D- Allyson)   FEDRA LANTER is a 58 y.o. year old female who is a primary care patient of Gasper, Nancyann BRAVO, MD.  Barnie JONETTA Mutton was referred to the pharmacist for assistance related to: Allergic Rhinitus  An unsuccessful telephone outreach was attempted today to contact the patient who was referred to the pharmacy team for assistance with medication assistance. Additional attempts will be made to contact the patient.  Leotis Rase [facility], [facility] Guide  Direct Dial: [phone]  Fax [phone]

## 2023-12-03 ENCOUNTER — Telehealth: Payer: Self-pay

## 2023-12-03 DIAGNOSIS — F411 Generalized anxiety disorder: Secondary | ICD-10-CM | POA: Diagnosis not present

## 2023-12-03 DIAGNOSIS — F331 Major depressive disorder, recurrent, moderate: Secondary | ICD-10-CM | POA: Diagnosis not present

## 2023-12-03 DIAGNOSIS — F4310 Post-traumatic stress disorder, unspecified: Secondary | ICD-10-CM | POA: Diagnosis not present

## 2023-12-03 NOTE — Telephone Encounter (Signed)
 Images have been uploaded into her chart. Dr. Isadora is aware.  Nothing further needed.

## 2023-12-03 NOTE — Telephone Encounter (Signed)
 That's fine

## 2023-12-03 NOTE — Telephone Encounter (Signed)
 Copied from CRM #[id number]. Topic: Clinical - Home Health Verbal Orders >> Dec 03, 2023  8:45 AM Selinda RAMAN wrote: Caller/Agency: Dorthea with [facility] Callback Number: [phone] Service Requested: Physical Therapy Frequency: 1 x a week for 1 week and 2 x a week for 4 weeks Any new concerns about the patient? No  Please assist patient further

## 2023-12-06 NOTE — Telephone Encounter (Signed)
 Advised

## 2023-12-07 ENCOUNTER — Ambulatory Visit (INDEPENDENT_AMBULATORY_CARE_PROVIDER_SITE_OTHER)

## 2023-12-07 DIAGNOSIS — J4540 Moderate persistent asthma, uncomplicated: Secondary | ICD-10-CM | POA: Diagnosis not present

## 2023-12-07 DIAGNOSIS — F411 Generalized anxiety disorder: Secondary | ICD-10-CM | POA: Diagnosis not present

## 2023-12-07 DIAGNOSIS — F4310 Post-traumatic stress disorder, unspecified: Secondary | ICD-10-CM | POA: Diagnosis not present

## 2023-12-07 LAB — PULMONARY FUNCTION TEST
DL/VA % pred: 91 %
DL/VA: 4.03 ml/min/mmHg/L
DLCO cor % pred: 73 %
DLCO cor: 12.64 ml/min/mmHg
DLCO unc % pred: 72 %
DLCO unc: 12.32 ml/min/mmHg
FEF 25-75 Post: 1.58 L/s
FEF 25-75 Pre: 1.89 L/s
FEF2575-%Change-Post: -16 %
FEF2575-%Pred-Post: 70 %
FEF2575-%Pred-Pre: 84 %
FEV1-%Change-Post: -6 %
FEV1-%Pred-Post: 77 %
FEV1-%Pred-Pre: 83 %
FEV1-Post: 1.72 L
FEV1-Pre: 1.84 L
FEV1FVC-%Change-Post: 0 %
FEV1FVC-%Pred-Pre: 109 %
FEV6-%Change-Post: -9 %
FEV6-%Pred-Post: 70 %
FEV6-%Pred-Pre: 78 %
FEV6-Post: 1.94 L
FEV6-Pre: 2.13 L
FEV6FVC-%Pred-Post: 103 %
FEV6FVC-%Pred-Pre: 103 %
FVC-%Change-Post: -6 %
FVC-%Pred-Post: 70 %
FVC-%Pred-Pre: 75 %
FVC-Post: 1.99 L
FVC-Pre: 2.13 L
Post FEV1/FVC ratio: 86 %
Post FEV6/FVC ratio: 100 %
Pre FEV1/FVC ratio: 87 %
Pre FEV6/FVC Ratio: 100 %
RV % pred: 90 %
RV: 1.49 L
TLC % pred: 88 %
TLC: 3.8 L

## 2023-12-07 NOTE — Progress Notes (Signed)
 Full PFT completed today ? ?

## 2023-12-07 NOTE — Patient Instructions (Signed)
 Full PFT completed today ? ?

## 2023-12-08 ENCOUNTER — Encounter: Payer: Self-pay | Admitting: Student in an Organized Health Care Education/Training Program

## 2023-12-08 ENCOUNTER — Ambulatory Visit: Admitting: Student in an Organized Health Care Education/Training Program

## 2023-12-08 VITALS — BP 110/66 | HR 79 | Temp 97.6°F | Ht 59.0 in | Wt 146.6 lb

## 2023-12-08 DIAGNOSIS — J4540 Moderate persistent asthma, uncomplicated: Secondary | ICD-10-CM

## 2023-12-08 DIAGNOSIS — Z95818 Presence of other cardiac implants and grafts: Secondary | ICD-10-CM | POA: Diagnosis not present

## 2023-12-08 DIAGNOSIS — I272 Pulmonary hypertension, unspecified: Secondary | ICD-10-CM

## 2023-12-08 DIAGNOSIS — R0602 Shortness of breath: Secondary | ICD-10-CM | POA: Diagnosis not present

## 2023-12-08 MED ORDER — INCRUSE ELLIPTA 62.5 MCG/ACT IN AEPB: 1.0000 | INHALATION_SPRAY | Freq: Every day | RESPIRATORY_TRACT | 12 refills | Status: AC

## 2023-12-08 NOTE — Patient Instructions (Signed)
  VISIT SUMMARY: Erin Richards, you came in today for a follow-up on your respiratory symptoms and elevated heart pressures. You have been experiencing ongoing shortness of breath that affects your daily activities and energy levels. You were recently hospitalized, and your medication was adjusted. You are currently using Incruse once daily and Wixela twice daily, with albuterol  as needed. You are also on a prednisone  taper and use a nebulizer as needed. Elevated pressures in your heart and lungs were noted during your recent hospitalization, and you have an upcoming appointment with your cardiologist.  YOUR PLAN: -PULMONARY HYPERTENSION: Pulmonary hypertension is a condition where there is high blood pressure in the arteries of the lungs and heart. This contributes to your shortness of breath. A recent echocardiogram confirmed these elevated pressures. We will conduct a repeat CT scan of your lungs and repeat blood work to further evaluate your condition. A message was sent to your cardiologist to discuss the need for a specialist referral. We may refer you to a pulmonary hypertension specialist if needed, and the potential need for invasive pressure measurement in the heart will be discussed with cardiology.  -ASTHMA: Asthma is a condition where your airways narrow and swell, making it difficult to breathe. Your current breathing test does not show active asthma, suggesting your symptoms are more likely due to pulmonary hypertension. Continue using Incruse one puff once daily and Wixela one puff twice daily. Keep albuterol  as a backup inhaler and use the nebulizer as needed.  -SHORTNESS OF BREATH SECONDARY TO PULMONARY HYPERTENSION AND ASTHMA: Your shortness of breath is primarily due to pulmonary hypertension, with asthma being less likely the cause. Continue your current inhaler regimen with Incruse and Wixela. Use albuterol  and the nebulizer as needed for symptom relief. Follow up with your cardiologist on  November 24th for further evaluation of pulmonary hypertension.  INSTRUCTIONS: Follow up with your cardiologist Dr. Ammon on November 24th for further evaluation of pulmonary hypertension. A repeat CT scan of your lungs and repeat blood work will be conducted to further evaluate your condition.        Today, I ordered blood work. You can get them draw at your preferred [facility] draw station. The nearest one to [facility] is at nearby [facility] ([address] [place], [place], [place] [address]).
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
## 2023-12-09 ENCOUNTER — Telehealth: Payer: Self-pay

## 2023-12-09 ENCOUNTER — Other Ambulatory Visit: Payer: Self-pay

## 2023-12-09 NOTE — Patient Instructions (Signed)
 Visit Information  Thank you for taking time to visit with me today. Please don't hesitate to contact me if I can be of assistance to you before our next scheduled telephone appointment.  Our next appointment is by telephone on 12/16/23 at 1400  Following is a copy of your care plan:   Goals Addressed             This Visit's Progress    VBCI Transitions of Care (TOC) Care Plan       Problems:  Recent Hospitalization for treatment of Hypoxia patient states Allergic Rhinitis and Asthma Knowledge Deficit Related to Medications from [facility] admission, Medication management barrier has not received medications as prescribed, and No Hospital Follow Up Provider appointment patient prefers to follow up with pulmonologist and will call her PCP as needed. Home Health ordered and called patient and said the would call her back to confirm services. 12/09/23 New diagnosis of Pulmonary Hypertension  Goal:  Over the next 30 days, the patient will not experience hospital readmission  Interventions:  Transitions of Care: Doctor Visits  - discussed the importance of doctor visits Communication with [facility] re: polypharmacy and medications needs from outside hospital  Has appointment with [facility], patient did not get auth for Breo, Pulmonologist adjusted medications and inhaler  Patient Self Care Activities:  Attend all scheduled provider appointments Call pharmacy for medication refills 3-7 days in advance of running out of medications Call provider office for new concerns or questions  Notify RN Care Manager of [facility] call rescheduling needs Participate in Transition of Care Program/Attend TOC scheduled calls Take medications as prescribed   12/09/23 update - Encouraged hospital follow up with PCP for medication review - appointment 12/10/23 with PCP  Plan:  Telephone follow up appointment with care management team member scheduled for:  12/09/23 1430 The patient has been provided  with contact information for the care management team and has been advised to call with any health related questions or concerns.  Referral to pharmacist for polypharmacy and post hospital medications from [facility] and ongoing medication management needs. 12/09/23 Continue care plan and RN TOC follow up in 1 week        Patient verbalizes understanding of instructions and care plan provided today and agrees to view in MyChart. Active MyChart status and patient understanding of how to access instructions and care plan via MyChart confirmed with patient.     The patient has been provided with contact information for the care management team and has been advised to call with any health related questions or concerns.   Please call the care guide team at [phone] if you need to cancel or reschedule your appointment.   Please call the Suicide and Crisis Lifeline: 988 call the [place]  National Suicide Prevention Lifeline: (581) 224-7625 or TTY: 8100641770 TTY 779-632-5340) to talk to a trained counselor call 1-800-273-TALK (toll free, 24 hour hotline) call 911 if you are experiencing a Mental Health or Behavioral Health Crisis or need someone to talk to.  [name], RN, BSN, CCM [facility], [facility] Management Coordinator Direct Dial: [phone]
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
## 2023-12-10 ENCOUNTER — Encounter: Payer: Self-pay | Admitting: Family Medicine

## 2023-12-10 ENCOUNTER — Ambulatory Visit (INDEPENDENT_AMBULATORY_CARE_PROVIDER_SITE_OTHER): Admitting: Family Medicine

## 2023-12-10 ENCOUNTER — Other Ambulatory Visit (HOSPITAL_COMMUNITY): Payer: Self-pay

## 2023-12-10 ENCOUNTER — Telehealth: Payer: Self-pay

## 2023-12-10 VITALS — BP 113/58 | HR 91 | Resp 16 | Wt 147.4 lb

## 2023-12-10 DIAGNOSIS — R0602 Shortness of breath: Secondary | ICD-10-CM

## 2023-12-10 DIAGNOSIS — R053 Chronic cough: Secondary | ICD-10-CM | POA: Diagnosis not present

## 2023-12-10 DIAGNOSIS — I272 Pulmonary hypertension, unspecified: Secondary | ICD-10-CM

## 2023-12-10 DIAGNOSIS — Q211 Atrial septal defect, unspecified: Secondary | ICD-10-CM

## 2023-12-10 NOTE — Telephone Encounter (Signed)
*  Pulm  Pharmacy Patient Advocate Encounter   Received notification from Fax that prior authorization for Wixela is required/requested.   Insurance verification completed.   The patient is insured through [facility].   Per test claim:  Brand Advair Diskus is preferred by the insurance.  If suggested medication is appropriate, Please send in a new RX and discontinue this one. If not, please advise as to why it's not appropriate so that we may request a Prior Authorization. Please note, some preferred medications may still require a PA.  If the suggested medications have not been trialed and there are no contraindications to their use, the PA will not be submitted, as it will not be approved.

## 2023-12-10 NOTE — Progress Notes (Signed)
 Established patient visit   Patient: Erin Richards   DOB: February 23, 1965   58 y.o. Female  MRN: [id number] Visit Date: 12/10/2023  Today's healthcare provider: Nancyann Perry, MD   Chief Complaint  Patient presents with   Hospitalization Follow-up    Patient was admitted at [facility]. Admit Date: 11/27/2023 Discharge Date: 11/30/2023  Patient feeling fatigue and sob Discharge ik:Ybenkpj    Subjective    Discussed the use of AI scribe software for clinical note transcription with the patient, who gave verbal consent to proceed.  History of Present Illness   Erin Richards is a 58 year old female who presents for a hospital follow-up after being hospitalized for persistent cough and shortness of breath.  She was hospitalized from October 25th to October 28th at [facility] due to persistent cough and shortness of breath. A CT scan of the chest revealed central pulmonary artery enlargement and a mosaic attenuation pattern of the lungs. An echocardiogram was unremarkable. She was discharged on a steroid taper, which she is currently following.  Since her discharge, she has experienced significant fatigue. Her oxygen  levels drop to 89-90% with activity, necessitating the use of home oxygen . She is on a prednisone  taper, currently taking two tablets for five days, following a regimen that started with five tablets for five days and decreased gradually.  Her current medications include Vyxella (Advair) and Incruse inhalers. She was previously on Trelegy but was switched back to Vyxella. She experiences chest pain that sometimes wakes her at night and occurs with activity, along with occasional blurred vision even with glasses. She recently had an eye exam and got new glasses this year.  She describes difficulty breathing with exertion, such as when going to the grocery store, and feels like she 'can't get no air' and has to stop. She also feels like there is something in  her throat that she can't clear, although she has no trouble swallowing.  She has a history of high blood pressure and an atrial septal defect (ASD) repair when she was young. She is not currently working and is considering short-term disability options.       Medications: Outpatient Medications Prior to Visit  Medication Sig   Accu-Chek Softclix Lancets lancets Use to check blood sugar daily   acetaminophen  (TYLENOL ) 500 MG tablet Take 500 mg by mouth every 6 (six) hours as needed.   ALPRAZolam  (XANAX ) 0.5 MG tablet Take 0.5-1 tablets (0.25-0.5 mg total) by mouth 2 (two) times daily as needed (pain attacks).   Azelastine  HCl 137 MCG/SPRAY SOLN PLACE 1 SPRAY INTO BOTH NOSTRILS 2 (TWO) TIMES DAILY (Patient taking differently: as needed. prn)   Blood Glucose Monitoring Suppl (ACCU-CHEK GUIDE) w/Device KIT Use daily to check blood sugars   buPROPion (WELLBUTRIN SR) 150 MG 12 hr tablet Take 150 mg by mouth every morning.   clonazePAM (KLONOPIN) 0.5 MG tablet Take 0.5 mg by mouth daily as needed.   escitalopram  (LEXAPRO ) 10 MG tablet Take 10 mg by mouth daily.   esomeprazole (NEXIUM) 40 MG capsule Take 1 capsule (40 mg total) by mouth daily.   estradiol  (ESTRACE ) 0.5 MG tablet Take 1 tablet (0.5 mg total) by mouth daily.   fluticasone  (FLONASE ) 50 MCG/ACT nasal spray Place 2 sprays into both nostrils daily as needed for allergies or rhinitis.   fluticasone -salmeterol (WIXELA INHUB) 250-50 MCG/ACT AEPB Inhale 1 puff into the lungs in the morning and at bedtime.   glucose blood (ACCU-CHEK  GUIDE TEST) test strip Use as instructed to check blood sugar daily   hyoscyamine  (LEVSIN ) 0.125 MG tablet TAKE ONE TABLET BY MOUTH EVERY 6 HOURS AS NEEDED FOR STOMACH CRAMPINGS   ipratropium (ATROVENT ) 0.06 % nasal spray Place 2 sprays into both nostrils 4 (four) times daily. (Patient taking differently: Place 2 sprays into both nostrils as needed. PRN)   levocetirizine (XYZAL ) 5 MG tablet TAKE 1 TABLET BY MOUTH  EVERY DAY IN THE EVENING   MISCELLANEOUS VAGINAL PRODUCTS VA Place vaginally.   montelukast  (SINGULAIR ) 10 MG tablet TAKE 1 TABLET BY MOUTH EVERYDAY AT BEDTIME   Multiple Vitamins-Minerals (VITAFUSION MULTI WOMENS PO) Take by mouth.   nortriptyline  (PAMELOR ) 25 MG capsule TAKE ONE CAPSULE AT BEDTIME FOR MIGRANE PREVENTION AND IRRITABLE BOWEL SYNDROME   ondansetron  (ZOFRAN -ODT) 4 MG disintegrating tablet Take 1 tablet (4 mg total) by mouth every 8 (eight) hours as needed for nausea or vomiting.   OXYGEN  Inhale 2 L into the lungs at bedtime as needed.   predniSONE  (DELTASONE ) 10 MG tablet Take 1 tablet (10 mg total) by mouth daily. Day 1 take 6 tablets Day 2 take 6 tablets Day 3 take 5 tablets Day 4 take 5 tablets Day 5 take 4 tablets Day 6 take 4 tablets Day 7 take 3 tablets Day 8 take 3 tablets Day 9 take 2 tablets Day 10 take 2 tablets Day 11 take 1 tablet Day 12 take 1 tablet   Probiotic Product (PROBIOTIC DAILY PO) Take by mouth.   RYALTRIS 665-25 MCG/ACT SUSP Place into both nostrils. (Patient taking differently: Place into both nostrils as needed. PRN)   Spacer/Aero-Holding Chambers (AEROCHAMBER PLUS) inhaler Use with inhaler   UBRELVY  50 MG TABS TAKE 50 MG BY MOUTH ONCE AS NEEDED FOR UP TO 1 DOSE (MIGRAINES, HEADACHES).   umeclidinium bromide (INCRUSE ELLIPTA) 62.5 MCG/ACT AEPB Inhale 1 puff into the lungs daily.   VENTOLIN  HFA 108 (90 Base) MCG/ACT inhaler Inhale 2 puffs into the lungs every 6 (six) hours as needed for wheezing or shortness of breath.   zolpidem  (AMBIEN ) 10 MG tablet TAKE 1 TABLET BY MOUTH EVERYDAY AT BEDTIME   aspirin EC 81 MG tablet Take by mouth. (Patient not taking: Reported on 12/10/2023)   bacitracin  ointment Apply 1 Application topically 2 (two) times daily. (Patient not taking: Reported on 12/08/2023)   chlorpheniramine-HYDROcodone (TUSSIONEX) 10-8 MG/5ML Take 5 mLs by mouth at bedtime as needed. (Patient not taking: Reported on 12/09/2023)   clotrimazole -betamethasone   (LOTRISONE ) cream Apply to affected area 2 times daily prn (Patient not taking: Reported on 12/08/2023)   hydrocortisone  (ANUSOL -HC) 25 MG suppository Place 1 suppository (25 mg total) rectally 2 (two) times daily. (Patient not taking: Reported on 12/08/2023)   linaclotide  (LINZESS ) 145 MCG CAPS capsule Take 1 capsule (145 mcg total) by mouth daily before breakfast. (Patient not taking: Reported on 12/09/2023)   methocarbamol  (ROBAXIN ) 500 MG tablet Take 1 tablet (500 mg total) by mouth every 8 (eight) hours as needed for muscle spasms. (Patient not taking: Reported on 12/08/2023)   Multiple Vitamin (MULTIVITAMIN WITH MINERALS) TABS tablet Take 2 tablets by mouth daily.   naproxen  (NAPROSYN ) 500 MG tablet TAKE 1 TABLET (500 MG TOTAL) BY MOUTH 2 (TWO) TIMES DAILY WITH A MEAL. AS NEEDED FOR PAIN (Patient not taking: Reported on 12/08/2023)   Olopatadine  HCl 0.2 % SOLN Apply 1 drop to eye daily. (Patient not taking: Reported on 12/08/2023)   promethazine  (PHENERGAN ) 12.5 MG tablet Take 1 tablet (12.5 mg total)  by mouth every 8 (eight) hours as needed for nausea or vomiting. (Patient not taking: Reported on 12/10/2023)   promethazine -dextromethorphan (PROMETHAZINE -DM) 6.25-15 MG/5ML syrup TAKE 2.5 MILLILITERS BY MOUTH 4 TIMES A DAY AS NEEDED FOR COUGH (ALLERGY TO DEXTROMETHORPHAN?) (Patient not taking: Reported on 12/09/2023)   No facility-administered medications prior to visit.   Review of Systems  Constitutional:  Negative for appetite change, chills, fatigue and fever.  Respiratory:  Negative for chest tightness and shortness of breath.   Cardiovascular:  Negative for chest pain and palpitations.  Gastrointestinal:  Negative for abdominal pain, nausea and vomiting.  Neurological:  Negative for dizziness and weakness.       Objective    BP (!) 113/58 (BP Location: Right Arm, Patient Position: Sitting, Cuff Size: Normal)   Pulse 91   Resp 16   Wt 147 lb 6.4 oz (66.9 kg)   SpO2 98%   BMI 29.77 kg/m    Physical Exam   General: Appearance:    Well developed, well nourished female in no acute distress  Eyes:    PERRL, conjunctiva/corneas clear, EOM's intact       Lungs:     Clear to auscultation bilaterally, respirations unlabored  Heart:    Normal heart rate. Normal rhythm.  2/6 blowing, holosystolic murmur at apex 2/6 high pitched, blowing, decrescendo, diastolic murmur at the left third intercostal space and lower sternal border   MS:   All extremities are intact.    Neurologic:   Awake, alert, oriented x 3. No apparent focal neurological defect.         Assessment & Plan        Pulmonary hypertension and acute hypersensitivity pneumonitis Recent hospitalization for cough and dyspnea. CT scan indicated pulmonary hypertension. Pulmonology suggested acute hypersensitivity pneumonitis. Echocardiogram unremarkable. Pulmonary hypertension may be linked to cardiac issues, possibly exacerbated by previous ASD repair. - Continue steroid taper as prescribed. - Use oxygen  at home as needed. - Use albuterol  inhaler for dyspnea. - Follow up with cardiologist on November 12th for pulmonary hypertension evaluation. - Discuss lidocaine  patches with cardiologist. - Consider Voltaren  for chest pain. - Discuss pulmonary hypertension treatment options with cardiologist.  Chronic cough, dyspnea, and chest pain Chronic cough and dyspnea with intermittent chest pain, possibly due to pulmonary hypertension and intercostal muscle strain. Blurred vision possibly related to anticholinergic inhalers. - Continue inhaler regimen with Vyxella and Incruse. - Use Voltaren  for chest pain. - Discuss lidocaine  patches with cardiologist. - Ensure up-to-date eye exams.  Blurred vision Possibly related to anticholinergic inhalers affecting vision accommodation. - Ensure up-to-date eye exams.         Erin Perry, MD  [facility] [phone] (phone) [phone] (fax)  [facility]

## 2023-12-10 NOTE — Telephone Encounter (Signed)
 Patient currently on Wixela was this factored into PA?

## 2023-12-11 ENCOUNTER — Other Ambulatory Visit: Payer: Self-pay | Admitting: Family Medicine

## 2023-12-13 ENCOUNTER — Telehealth: Payer: Self-pay

## 2023-12-13 ENCOUNTER — Ambulatory Visit: Payer: Self-pay

## 2023-12-13 NOTE — Telephone Encounter (Signed)
 Copied from CRM [id number]. Topic: Clinical - Medical Advice >> Dec 13, 2023 12:14 PM Isabell A wrote: Reason for CRM: Patient was told by Dr.Dgayli to make an appointment with her cardiologist - pt states the office does not have any availability until 11/26 and was told by Dr.Dgayli she needs to be seen sooner.   Callback number: [phone]

## 2023-12-13 NOTE — Telephone Encounter (Signed)
 Are you okay with that time frame?

## 2023-12-13 NOTE — Telephone Encounter (Signed)
 FYI Only or Action Required?: Action required by provider: clinical question for provider.  Patient is followed in Pulmonology for asthma, last seen on 12/08/2023 by Isadora Hose, MD.  Called Nurse Triage reporting Shortness of Breath.  Symptoms began a week ago.  Interventions attempted: Rescue inhaler.  Symptoms are: unchanged.  Triage Disposition: Call PCP Now  Patient/caregiver understands and will follow disposition?: Yes    Copied from CRM [id number]. Topic: Clinical - Red Word Triage >> Dec 13, 2023  4:28 PM [name] wrote: Red Word that prompted transfer to Nurse Triage: oxygen  levels dropping Reason for Disposition  [1] Monitoring oxygen  level (e.g., pulse oximetry) AND [2] fall in oxygen  level 4% or more (below known patient baseline, while awake and resting) AND [3] new difficulty breathing or worse than when discharged from hospital  Answer Assessment - Initial Assessment Questions Pt states that she is unable to see her cardiologist until 11/26. She states that her oxygen  level has been dropping when she is walking. States it has been going on for about a week, got as low as 87%. She states it takes her about 10 minutes to get back up to 99%. She states yesterday she was at the park for family pictures and had to use her inhaler 4 times while there and then 2 more times at home. She states she feels like her chest was tightening up and she couldn't get relief. She states it drops only on exertion. She states that she is calling because PT was out there today and told her she may need oxygen  when she is out and about.    1. MAIN CONCERN OR SYMPTOM: What's your main concern? (e.g., low oxygen  level, breathing difficulty) What question do you have?     Low oxygen  levels with dropping 2.  OXYGEN  EQUIPMENT:  Are you having trouble with your oxygen  equipment?  (e.g., cannula, mask, tubing, tank, concentrator)     Oxygen  at night 2L 3. ONSET: When did the  low oxygen   start?       About a week 4. OXYGEN  THERAPY:      Only at night 5. PULSE OXIMETER:      yes 6. O2 MONITORING: What is the oxygen  level (pulse ox reading)? (e.g., 70-100%)     94% while on the phone with triage RN 7. VITAL SIGNS MONITORING: Do you monitor/measure your oxygen  level or vital signs (e.g., yes, no, measurements are automatically sent to provider/call center). Document CURRENT and NORMAL BASELINE values if available.       Baseline upper 90's, currenlty 94% with exertion as low ast 87%.  8. BREATHING DIFFICULTY: Are you having any difficulty breathing? If Yes, ask: How bad is it?  (e.g., none, mild, moderate, severe)      Feels the same as last week when she had appt 9. OTHER SYMPTOMS: Do you have any other symptoms? (e.g., fever, change in sputum)     no  Protocols used: COPD Oxygen  Monitoring and Hypoxia-A-AH

## 2023-12-13 NOTE — Telephone Encounter (Signed)
 I spoke with the patient and scheduled her an appt for 11/12 with Dr. Malka to do a walk test in the office.   Nothing further needed.

## 2023-12-15 ENCOUNTER — Other Ambulatory Visit (INDEPENDENT_AMBULATORY_CARE_PROVIDER_SITE_OTHER)

## 2023-12-15 ENCOUNTER — Ambulatory Visit: Admitting: Pulmonary Disease

## 2023-12-15 VITALS — BP 122/62 | HR 97 | Temp 98.7°F | Ht 59.0 in | Wt 149.0 lb

## 2023-12-15 DIAGNOSIS — R0602 Shortness of breath: Secondary | ICD-10-CM | POA: Diagnosis not present

## 2023-12-15 DIAGNOSIS — Z95818 Presence of other cardiac implants and grafts: Secondary | ICD-10-CM | POA: Diagnosis not present

## 2023-12-15 DIAGNOSIS — I270 Primary pulmonary hypertension: Secondary | ICD-10-CM

## 2023-12-15 DIAGNOSIS — J4540 Moderate persistent asthma, uncomplicated: Secondary | ICD-10-CM | POA: Diagnosis not present

## 2023-12-15 DIAGNOSIS — I361 Nonrheumatic tricuspid (valve) insufficiency: Secondary | ICD-10-CM | POA: Diagnosis not present

## 2023-12-15 DIAGNOSIS — R6889 Other general symptoms and signs: Secondary | ICD-10-CM | POA: Diagnosis not present

## 2023-12-15 DIAGNOSIS — Q211 Atrial septal defect, unspecified: Secondary | ICD-10-CM | POA: Diagnosis not present

## 2023-12-15 DIAGNOSIS — I340 Nonrheumatic mitral (valve) insufficiency: Secondary | ICD-10-CM | POA: Diagnosis not present

## 2023-12-15 NOTE — Telephone Encounter (Signed)
 Noted. Nothing further needed.
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
## 2023-12-15 NOTE — Progress Notes (Signed)
   12/15/2023 Name: NEELAM TIGGS MRN: [id number] DOB: 1965-02-26  SHAWNI VOLKOV is a 58 y.o. year old female who presented for a telephone visit.   They were referred to the pharmacist by the [facility] Transitions of Care program for assistance in managing medication access.    Subjective:  Care Team: Primary Care Provider: Gasper Nancyann BRAVO, MD  Medication Access/Adherence  Current Pharmacy:  [facility] [phone] [address], [place] - [address] [place] [address] Phone: [phone] Fax: [phone]  [facility] - [place], [place] - [address] [place] [address] Phone: [phone] Fax: [phone]   Patient reports affordability concerns with their medications: No  Patient reports access/transportation concerns to their pharmacy: No  Patient reports adherence concerns with their medications:  No     Patient reported to [facility] nurse that she had no received her fluticasone  furoate-vilanterol 200-25 mcg/dose inhaler from the pharmacy following discharge from [facility].   Patient was switched from Breo to Wixela inhaler in the interim. Per the pharmacy, preferred brand product is Advair Diskus with a copay $4. Patient received prescription without issues.   Objective:  Lab Results  Component Value Date   HGBA1C 4.1 (L) 04/29/2020    Lab Results  Component Value Date   CREATININE 0.76 11/22/2023   BUN 9 11/22/2023   NA 140 11/22/2023   K 3.7 11/22/2023   CL 103 11/22/2023   CO2 26 11/22/2023    Lab Results  Component Value Date   CHOL 162 07/19/2023   HDL 57 07/19/2023   LDLCALC 92 07/19/2023   TRIG 67 07/19/2023   CHOLHDL 2.8 07/19/2023    Medications Reviewed Today   Medications were not reviewed in this encounter       Assessment/Plan:   Medication Access: - Per pharmacy, preferred product is Advair Diskus. Patient has since received inhaler product without issues.     Follow Up Plan:  -Pulmonology on 12/15/23 -Cardiology  on 12/15/23   Peyton CHARLENA Ferries, PharmD, CPP Clinical Pharmacist [facility] [phone]

## 2023-12-16 ENCOUNTER — Telehealth: Payer: Self-pay

## 2023-12-16 DIAGNOSIS — Q121 Congenital displaced lens: Secondary | ICD-10-CM | POA: Diagnosis not present

## 2023-12-16 DIAGNOSIS — R0902 Hypoxemia: Secondary | ICD-10-CM | POA: Diagnosis not present

## 2023-12-17 ENCOUNTER — Telehealth: Payer: Self-pay

## 2023-12-17 ENCOUNTER — Ambulatory Visit
Admission: RE | Admit: 2023-12-17 | Discharge: 2023-12-17 | Disposition: A | Discharge status: Home / Self Care | Source: Ambulatory Visit | Attending: Student in an Organized Health Care Education/Training Program | Admitting: Student in an Organized Health Care Education/Training Program

## 2023-12-17 DIAGNOSIS — R918 Other nonspecific abnormal finding of lung field: Secondary | ICD-10-CM | POA: Diagnosis not present

## 2023-12-17 DIAGNOSIS — Z79899 Other long term (current) drug therapy: Secondary | ICD-10-CM | POA: Diagnosis not present

## 2023-12-17 DIAGNOSIS — I272 Pulmonary hypertension, unspecified: Secondary | ICD-10-CM | POA: Diagnosis not present

## 2023-12-17 DIAGNOSIS — R0602 Shortness of breath: Secondary | ICD-10-CM | POA: Diagnosis not present

## 2023-12-17 DIAGNOSIS — F331 Major depressive disorder, recurrent, moderate: Secondary | ICD-10-CM | POA: Diagnosis not present

## 2023-12-17 DIAGNOSIS — F4310 Post-traumatic stress disorder, unspecified: Secondary | ICD-10-CM | POA: Diagnosis not present

## 2023-12-17 DIAGNOSIS — F5102 Adjustment insomnia: Secondary | ICD-10-CM | POA: Diagnosis not present

## 2023-12-17 DIAGNOSIS — F411 Generalized anxiety disorder: Secondary | ICD-10-CM | POA: Diagnosis not present

## 2023-12-17 NOTE — Telephone Encounter (Signed)
 Patient picked up Wixela nfn.

## 2023-12-17 NOTE — Transitions of Care (Post Inpatient/ED Visit) (Signed)
 Care Management  Transitions of Care Program Transitions of Care Post-discharge week 3  12/17/2023 Name: Erin Richards MRN: [id number] DOB: 02-13-1965  Subjective: Erin Richards is a 58 y.o. year old female who is a primary care patient of Fisher, Nancyann BRAVO, MD. The Care Management team spoke with patient by telephone to assess and address transitions of care needs. Patient states she was unable to talk because she was getting a CT scan. Patient wants a call back next week and will schedule with this clinical research associate.  Plan: Additional outreach attempts will be made to reach the patient enrolled in the [facility] Program (Post Inpatient/ED Visit).  [name], RN, BSN, CCM [facility], [facility] Management Coordinator Direct Dial: [phone]

## 2023-12-20 ENCOUNTER — Ambulatory Visit: Payer: Self-pay

## 2023-12-20 ENCOUNTER — Telehealth: Payer: Self-pay

## 2023-12-20 NOTE — Transitions of Care (Post Inpatient/ED Visit) (Signed)
 Care Management  Transitions of Care Program Transitions of Care Post-discharge week 3  12/20/2023 Name: Erin Richards MRN: [id number] DOB: [birth year]  Subjective: Erin Richards is a 58 y.o. year old female who is a primary care patient of Fisher, Nancyann BRAVO, MD. The Care Management team was unable to reach the patient by phone to assess and address transitions of care needs.   Plan: No further outreach attempts will be made at this time.  We have been unable to reach the patient.  Unable to leave voicemail message. Closed 30 day program.  [name], RN, BSN, CCM [facility], [facility] Management Coordinator Direct Dial: [phone]

## 2023-12-20 NOTE — Telephone Encounter (Signed)
 FYI Only or Action Required?: FYI only for provider: appointment scheduled on 12/22/23, added appointment to wait list.  Patient was last seen in primary care on 12/10/2023 by Gasper Nancyann BRAVO, MD.  Called Nurse Triage reporting Excessive Sweating.  Symptoms began a week ago.  Interventions attempted: Nothing.  Symptoms are: unchanged.  Triage Disposition: See PCP When Office is Open (Within 3 Days)  Patient/caregiver understands and will follow disposition?: Yes    Copied from CRM #[id number]. Topic: Clinical - Red Word Triage >> Dec 20, 2023  4:26 PM Winona R wrote: Pt states she she cant cool her body down and feels like shes always sweating especially at night.- She does not have a fever. Should I schedule pink word/ red word. Pt also wanted to share she has Cardiovascular conditions Reason for Disposition  [1] MODERATE sweating (e.g., interferes with normal activities like work or school) AND [2] cause unknown AND [3] new-onset in the last 4 weeks  Answer Assessment - Initial Assessment Questions Additional info: Scheduled next available appointment with pcp on 12/22/23, added appointment to wait list. Reviewed ED precautions.    1. ONSET: When did the sweating start?      Several days ago  2. LOCATION: What part of your body has excessive sweating? (e.g., entire body; just face, underarms, palms, or soles of feet).      Entire body  3. SEVERITY: How bad is the sweating?    (Scale 1-10; or mild, moderate, severe)     Worse at night -not soaking bed sheets  4. CAUSE: What do you think is causing the sweating?     Unsure 5. FEVER: Have you been having fevers? If Yes, ask: What is your temperature, how was it measured, and when did it start?     denies 6. OTHER SYMPTOMS: Do you have any other symptoms? (e.g., chest pain, difficulty breathing, lightheadedness, weight loss)     Denies all other symptoms. Notes she has hypertension but not feeling any chest pain, pain  in general, palpitations. Chronic shortness of breath is unchanged.  Protocols used: [facility]

## 2023-12-22 ENCOUNTER — Ambulatory Visit: Admitting: Family Medicine

## 2023-12-22 ENCOUNTER — Ambulatory Visit
Admission: RE | Admit: 2023-12-22 | Discharge: 2023-12-22 | Disposition: A | Source: Ambulatory Visit | Attending: Family Medicine | Admitting: Family Medicine

## 2023-12-22 ENCOUNTER — Encounter: Payer: Self-pay | Admitting: Family Medicine

## 2023-12-22 ENCOUNTER — Ambulatory Visit
Admission: RE | Admit: 2023-12-22 | Discharge: 2023-12-22 | Disposition: A | Discharge status: Home / Self Care | Attending: Family Medicine | Admitting: Family Medicine

## 2023-12-22 VITALS — BP 97/62 | HR 86 | Resp 16 | Wt 149.0 lb

## 2023-12-22 DIAGNOSIS — M545 Low back pain, unspecified: Secondary | ICD-10-CM

## 2023-12-22 DIAGNOSIS — M25551 Pain in right hip: Secondary | ICD-10-CM | POA: Diagnosis not present

## 2023-12-22 DIAGNOSIS — B370 Candidal stomatitis: Secondary | ICD-10-CM | POA: Diagnosis not present

## 2023-12-22 MED ORDER — NYSTATIN 100000 UNIT/ML MT SUSP: 5.0000 mL | Freq: Three times a day (TID) | OROMUCOSAL | 1 refills | Status: DC

## 2023-12-22 MED ORDER — ONDANSETRON 4 MG PO TBDP: 4.0000 mg | ORAL_TABLET | Freq: Three times a day (TID) | ORAL | 0 refills | Status: DC | PRN

## 2023-12-22 MED ORDER — TRAMADOL HCL 50 MG PO TABS
50.0000 mg | ORAL_TABLET | Freq: Three times a day (TID) | ORAL | 0 refills | Status: AC | PRN
Start: 2023-12-22 — End: 2023-12-27

## 2023-12-23 ENCOUNTER — Other Ambulatory Visit: Payer: Self-pay

## 2023-12-23 ENCOUNTER — Ambulatory Visit: Payer: Self-pay | Admitting: Student in an Organized Health Care Education/Training Program

## 2023-12-23 ENCOUNTER — Telehealth: Payer: Self-pay
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
## 2023-12-23 NOTE — Patient Instructions (Signed)
 Visit Information  Erin Richards was given information about Medicaid Managed Care team care coordination services as a part of their Healthy [facility] Medicaid benefit. Erin Richards   If you would like to schedule transportation through your [facility] plan, please call the following number at least 2 days in advance of your appointment: [phone]  For information about your ride after you set it up, call Ride Assist at [phone]. Use this number to activate a Will Call pickup, or if your transportation is late for a scheduled pickup. Use this number, too, if you need to make a change or cancel a previously scheduled reservation.  If you need transportation services right away, call [phone]. The after-hours call center is staffed 24 hours to handle ride assistance and urgent reservation requests (including discharges) 365 days a year. Urgent trips include sick visits, hospital discharge requests and life-sustaining treatment.  Call the [facility] Line at [phone], at any time, 24 hours a day, 7 days a week. If you are in danger or need immediate medical attention call 911.   Please see education materials related to Pulmonary Htn, Fatigue provided by MyChart link.  Patient verbalizes understanding of instructions and care plan provided today and agrees to view in MyChart. Active MyChart status and patient understanding of how to access instructions and care plan via MyChart confirmed with patient.     Telephone follow up appointment with Managed Medicaid care management team member scheduled for: 01/18/2024 at 1:00 pm  [name], MSN, RN [facility]  [facility], [facility] RN Care Manager Direct Dial: [phone] Fax: [phone]   Following is a copy of your plan of care:  There are no care plans that you recently modified to display for this patient.     Pulmonary Hypertension Pulmonary hypertension is a condition that  causes high blood pressure in the blood vessels of your lungs. This makes your heart work extra hard to pump blood to your lungs. And when your heart has to work harder, it can be harder for you to breathe. Over time, this can hurt and weaken your heart. What are the causes? This condition can be put into one of five groups based on what causes it. Group 1 happens when the blood vessels that carry blood to your lungs get too thick or stiff. This may happen with no known cause or it may be: Inherited. This means it's passed from parent to child. Caused by another disease, such as a disease of the heart or liver. Caused by some medicines or poisons. Group 2 happens if you have problems with the valves in your heart or if the left side of your heart, also called your left ventricle, gets weak. Group 3 can be caused by long-term diseases of the lungs, such as chronic obstructive pulmonary disease or COPD. This can also happen if you don't get enough oxygen , such as if you have trouble breathing when you sleep or if you live at a high altitude. Group 4 is caused by blood clots in your lungs. Group 5 includes other causes, such as sickle cell anemia or growths of cells that aren't normal called tumors. What are the signs or symptoms? Symptoms may include: Shortness of breath. A cough. Tiredness. Feeling dizzy or light-headed. You may also faint. A fast heart beat. You may also feel your heart flutter or skip a beat. Your lips or fingers turning blue. Chest pain or tightness. How is this diagnosed? This condition  may be diagnosed with: Blood tests. Imaging tests. These may include: Chest X-rays. CT scans. An echocardiogram. This is an ultrasound of your heart. A ventilation-perfusion scan. This sees how well air and blood flow in and out of your lungs. A pulmonary function test. This looks at how much air your lungs can hold. A 6-minute walk test. This may be done to check your breathing  during exercise. Cardiac catheterization. This is a procedure that uses a soft tube called a catheter to check the arteries of your heart. A lung biopsy. This is when a small piece of tissue is removed from your lung for testing. How is this treated? There's no cure for this condition. But treatment can help you feel better and can slow the condition down. You may need: Oxygen  therapy. Cardiac rehabilitation, or rehab. This is a program that teaches you how to: Care for your heart. Exercise. Go back to your normal activities. Medicines to: Lower your blood pressure. Relax the blood vessels in your lungs. Help your heart pump more blood. Help your body get rid of extra fluid. Thin your blood. This can stop you from getting blood clots. Lung surgery. This can relieve pressure on your heart. You may need this if other treatments don't work. Heart-lung or lung transplant. This may be done in very severe cases. Follow these instructions at home: Eating and drinking  Eat a healthy diet. Eat lots of fresh fruits and vegetables, whole grains, and beans. Limit how much salt you take in to less than 2,300 mg a day. Salt is also called sodium. Activity Get lots of rest. Exercise as told. Ask your health care provider what types of exercise are safe for you. Lifestyle Do not use any products that contain nicotine or tobacco. These products include cigarettes, chewing tobacco, and vaping devices, such as e-cigarettes. If you need help quitting, ask your provider. Stay away when other people smoke. Do not sit in hot tubs or saunas for long stretches of time. Do not get pregnant. If needed, talk with your provider about birth control. Avoid high altitudes. It can be stressful to live with pulmonary hypertension. Talk with your provider about finding support groups and online help. General instructions Take over-the-counter and prescription medicines only as told by your provider. Do not change or  stop medicines without talking with your provider. Stay up to date on your shots, such as your yearly flu shot and pneumonia shot. Use oxygen  therapy at home as told. Keep all follow-up visits. Your provider will check your breathing and make changes to your medicines as needed. Contact a health care provider if: Your cough gets worse. You have more shortness of breath. You start to have trouble doing things you could do before. You need to use more medicines or oxygen , or you need to use them more often than normal. Get help right away if: You have severe shortness of breath. You have chest pain or pressure. You cough up blood. These symptoms may be an emergency. Get help right away. Call 911. Do not wait to see if the symptoms will go away. Do not drive yourself to the hospital. This information is not intended to replace advice given to you by your health care provider. Make sure you discuss any questions you have with your health care provider. Document Revised: 04/06/2022 Document Reviewed: 04/06/2022 Elsevier Patient Education  2024 Else[employer] Inc.   Fatigue If you have fatigue, you feel tired all the time and have a lack  of energy or a lack of motivation. Fatigue may make it difficult to start or complete tasks because of exhaustion. Occasional or mild fatigue is often a normal response to activity or life. However, long-term (chronic) or extreme fatigue may be a symptom of a medical condition such as: Depression. Not having enough red blood cells or hemoglobin in the blood (anemia). A problem with a small gland located in the lower front part of the neck (thyroid  disorder). Rheumatologic conditions. These are problems related to the body's defense system (immune system). Infections, especially certain viral infections. Fatigue can also lead to negative health outcomes over time. Follow these instructions at home: Medicines Take over-the-counter and prescription medicines only  as told by your health care provider. Take a multivitamin if told by your health care provider. Do not use herbal or dietary supplements unless they are approved by your health care provider. Eating and drinking  Avoid heavy meals in the evening. Eat a well-balanced diet, which includes lean proteins, whole grains, plenty of fruits and vegetables, and low-fat dairy products. Avoid eating or drinking too many products with caffeine in them. Avoid alcohol. Drink enough fluid to keep your urine pale yellow. Activity  Exercise regularly, as told by your health care provider. Use or practice techniques to help you relax, such as yoga, tai chi, meditation, or massage therapy. Lifestyle Change situations that cause you stress. Try to keep your work and personal schedules in balance. Do not use recreational or illegal drugs. General instructions Monitor your fatigue for any changes. Go to bed and get up at the same time every day. Avoid fatigue by pacing yourself during the day and getting enough sleep at night. Maintain a healthy weight. Contact a health care provider if: Your fatigue does not get better. You have a fever. You suddenly lose or gain weight. You have headaches. You have trouble falling asleep or sleeping through the night. You feel angry, guilty, anxious, or sad. You have swelling in your legs or another part of your body. Get help right away if: You feel confused, feel like you might faint, or faint. Your vision is blurry or you have a severe headache. You have severe pain in your abdomen, your back, or the area between your waist and hips (pelvis). You have chest pain, shortness of breath, or an irregular or fast heartbeat. You are unable to urinate, or you urinate less than normal. You have abnormal bleeding from the rectum, nose, lungs, nipples, or, if you are female, the vagina. You vomit blood. You have thoughts about hurting yourself or others. These symptoms may  be an emergency. Get help right away. Call 911. Do not wait to see if the symptoms will go away. Do not drive yourself to the hospital. Get help right away if you feel like you may hurt yourself or others, or have thoughts about taking your own life. Go to your nearest emergency room or: Call 911. Call the National Suicide Prevention Lifeline at 220 073 9876 or 988. This is open 24 hours a day. Text the Crisis Text Line at 438-865-8720. Summary If you have fatigue, you feel tired all the time and have a lack of energy or a lack of motivation. Fatigue may make it difficult to start or complete tasks because of exhaustion. Long-term (chronic) or extreme fatigue may be a symptom of a medical condition. Exercise regularly, as told by your health care provider. Change situations that cause you stress. Try to keep your work and personal schedules  in balance. This information is not intended to replace advice given to you by your health care provider. Make sure you discuss any questions you have with your health care provider. Document Revised: 11/11/2020 Document Reviewed: 11/11/2020 Elsevier Patient Education  2024 [employer].

## 2023-12-23 NOTE — Telephone Encounter (Signed)
 Copied from CRM #[id number]. Topic: Clinical - Prescription Issue >> Dec 23, 2023 12:10 PM Erin Richards wrote: Reason for CRM: Pt called reporting that she went to the pharmacy last night and was told that there was nothing called in for her. Our records show that Tramadol , Ondansetron , and a Magic Mouthwash Rx were all submitted and confirmed via E-Receipt as of 12/22/2023 at 4:21 pm.   She also reports that they cannot do the magic mouthwash because they do not provide compound medications. Must be sent elsewhere.  She is requesting that this gets sent [facility] can make this for her.   Says she needs this urgently because she is experiencing pain and discomfort.

## 2023-12-24 ENCOUNTER — Ambulatory Visit: Payer: Self-pay | Admitting: Family Medicine

## 2023-12-24 MED ORDER — MAGIC MOUTHWASH: ORAL | 1 refills | Status: DC

## 2023-12-24 MED ORDER — PREDNISONE 10 MG PO TABS: ORAL_TABLET | ORAL | 0 refills | Status: AC

## 2023-12-24 NOTE — Telephone Encounter (Unsigned)
 Copied from CRM #[id number]. Topic: Clinical - Prescription Issue >> Dec 24, 2023 11:27 AM Hadassah PARAS wrote: Pt is following up on this request. Advised we are still working on this and that this req has been sent in as a high priority

## 2023-12-28 ENCOUNTER — Other Ambulatory Visit: Payer: Self-pay

## 2023-12-28 ENCOUNTER — Ambulatory Visit
Admission: RE | Admit: 2023-12-28 | Discharge: 2023-12-28 | Disposition: A | Discharge status: Home / Self Care | Attending: Cardiology | Admitting: Cardiology

## 2023-12-28 ENCOUNTER — Encounter
Admission: RE | Disposition: A | Discharge status: Home / Self Care | Payer: Self-pay | Source: Home / Self Care | Attending: Cardiology

## 2023-12-28 ENCOUNTER — Encounter: Payer: Self-pay | Admitting: Cardiology

## 2023-12-28 DIAGNOSIS — Q211 Atrial septal defect, unspecified: Secondary | ICD-10-CM | POA: Insufficient documentation

## 2023-12-28 DIAGNOSIS — I081 Rheumatic disorders of both mitral and tricuspid valves: Secondary | ICD-10-CM | POA: Insufficient documentation

## 2023-12-28 DIAGNOSIS — Z79899 Other long term (current) drug therapy: Secondary | ICD-10-CM | POA: Insufficient documentation

## 2023-12-28 DIAGNOSIS — I2729 Other secondary pulmonary hypertension: Secondary | ICD-10-CM | POA: Diagnosis not present

## 2023-12-28 DIAGNOSIS — I272 Pulmonary hypertension, unspecified: Secondary | ICD-10-CM | POA: Diagnosis not present

## 2023-12-28 HISTORY — PX: RIGHT HEART CATH: CATH118263

## 2023-12-28 LAB — MYOMARKER 3 PLUS PROFILE (RDL)
Anti-Jo-1 Ab (RDL): <20 U (ref <20)
Anti-MDA-5 Ab (CADM-140)(RDL): <20 U (ref <20)
Anti-NXP-2 (P140) Ab (RDL): <20 U (ref <20)
Anti-PM/Scl-100 Ab (RDL): <20 U (ref <20)
Anti-SAE1 Ab, IgG (RDL): <20 U (ref <20)
Anti-SS-A 52kD Ab, IgG (RDL): <20 U (ref <20)
Anti-TIF-1gamma Ab (RDL): <20 U (ref <20)
Anti-U1 RNP Ab (RDL): <20 U (ref <20)

## 2023-12-28 LAB — POCT I-STAT EG7
Acid-Base Excess: 0 mmol/L (ref 0.0–2.0)
Bicarbonate: 24.3 mmol/L (ref 20.0–28.0)
Calcium, Ion: 1.23 mmol/L (ref 1.15–1.40)
HCT: 32 % — ABNORMAL LOW (ref 36.0–46.0)
Hemoglobin: 10.9 g/dL — ABNORMAL LOW (ref 12.0–15.0)
O2 Saturation: 70 %
Potassium: 4.1 mmol/L (ref 3.5–5.1)
Sodium: 140 mmol/L (ref 135–145)
TCO2: 25 mmol/L (ref 22–32)
pCO2, Ven: 37.4 mmHg — ABNORMAL LOW (ref 44–60)
pH, Ven: 7.421 (ref 7.25–7.43)
pO2, Ven: 36 mmHg (ref 32–45)

## 2023-12-28 LAB — ANA 12 PLUS PROFILE, POSITIVE
Anti-Cardiolipin Ab, IgA (RDL): <12 U/mL (ref <12)
Anti-Cardiolipin Ab, IgG (RDL): <15 (ref <15)
Anti-Cardiolipin Ab, IgM (RDL): 25 [MPL'U]/mL — AB (ref <13)
Anti-Centromere Ab (RDL): <1:40 {titer}
Anti-Chromatin Ab, IgG (RDL): <20 U (ref <20)
Anti-La (SS-B) Ab (RDL): <20 U (ref <20)
Anti-Ro (SS-A) Ab (RDL): <20 U (ref <20)
Anti-Scl-70 Ab (RDL): <20 U (ref <20)
Anti-Sm Ab (RDL): <20 U (ref <20)
Anti-TPO Ab (RDL): <9 [IU]/mL (ref <9.0)
Anti-dsDNA Ab by Farr(RDL): <8 [IU]/mL (ref <8.0)
C3 Complement (RDL): 114 mg/dL (ref 90–180)
C4 Complement (RDL): 2 mg/dL — ABNORMAL LOW (ref 10–40)
Homogeneous Pattern: 1:160 {titer} — ABNORMAL HIGH

## 2023-12-28 LAB — RHEUMATOID FACTOR: Rheumatoid fact SerPl-aCnc: <10 [IU]/mL (ref <14.0)

## 2023-12-28 LAB — ANA 12 PLUS PROFILE (RDL): Anti-Nuclear Ab by IFA (RDL): POSITIVE — AB

## 2023-12-28 LAB — ANTI-CCP AB, IGG + IGA (RDL): Anti-CCP Ab, IgG + IgA (RDL): <20 U (ref <20)

## 2023-12-28 SURGERY — RIGHT HEART CATH
Anesthesia: Moderate Sedation | Laterality: Right

## 2023-12-28 MED ORDER — LIDOCAINE HCL (PF) 1 % IJ SOLN
INTRAMUSCULAR | Status: DC | PRN
  Administered 2023-12-28: 5 mL

## 2023-12-28 MED ORDER — SODIUM CHLORIDE 0.9 % IV SOLN: 250.0000 mL | INTRAVENOUS | Status: DC | PRN

## 2023-12-28 MED ORDER — FREE WATER: 500.0000 mL | Freq: Once | Status: DC

## 2023-12-28 MED ORDER — HYDRALAZINE HCL 20 MG/ML IJ SOLN: 10.0000 mg | INTRAMUSCULAR | Status: DC | PRN

## 2023-12-28 MED ORDER — FENTANYL CITRATE (PF) 100 MCG/2ML IJ SOLN
INTRAMUSCULAR | Status: AC
  Filled 2023-12-28: qty 2

## 2023-12-28 MED ORDER — HEPARIN (PORCINE) IN NACL 1000-0.9 UT/500ML-% IV SOLN
INTRAVENOUS | Status: DC | PRN
  Administered 2023-12-28 (×2): 500 mL

## 2023-12-28 MED ORDER — ONDANSETRON HCL 4 MG/2ML IJ SOLN: 4.0000 mg | Freq: Four times a day (QID) | INTRAMUSCULAR | Status: DC | PRN

## 2023-12-28 MED ORDER — SODIUM CHLORIDE 0.9% FLUSH: 3.0000 mL | Freq: Two times a day (BID) | INTRAVENOUS | Status: DC

## 2023-12-28 MED ORDER — SODIUM CHLORIDE 0.9% FLUSH: 3.0000 mL | INTRAVENOUS | Status: DC | PRN

## 2023-12-28 MED ORDER — LABETALOL HCL 5 MG/ML IV SOLN: 10.0000 mg | INTRAVENOUS | Status: DC | PRN

## 2023-12-28 MED ORDER — HEPARIN (PORCINE) IN NACL 1000-0.9 UT/500ML-% IV SOLN
INTRAVENOUS | Status: AC
Start: 2023-12-28 — End: 2023-12-28
  Filled 2023-12-28: qty 1000

## 2023-12-28 MED ORDER — MIDAZOLAM HCL 2 MG/2ML IJ SOLN
INTRAMUSCULAR | Status: AC
  Filled 2023-12-28: qty 2

## 2023-12-28 MED ORDER — LIDOCAINE HCL 1 % IJ SOLN
INTRAMUSCULAR | Status: AC
  Filled 2023-12-28: qty 20

## 2023-12-28 SURGICAL SUPPLY — 6 items
CATH BALLN WEDGE 5F 110CM (CATHETERS) IMPLANT
DRAPE BRACHIAL (DRAPES) IMPLANT
PACK CARDIAC CATH (CUSTOM PROCEDURE TRAY) ×1 IMPLANT
SET ATX-X65L (MISCELLANEOUS) IMPLANT
SHEATH GLIDE SLENDER 4/5FR (SHEATH) IMPLANT
STATION PROTECTION PRESSURIZED (MISCELLANEOUS) IMPLANT

## 2024-01-03 DIAGNOSIS — F331 Major depressive disorder, recurrent, moderate: Secondary | ICD-10-CM | POA: Diagnosis not present

## 2024-01-03 DIAGNOSIS — F4310 Post-traumatic stress disorder, unspecified: Secondary | ICD-10-CM | POA: Diagnosis not present

## 2024-01-03 DIAGNOSIS — F411 Generalized anxiety disorder: Secondary | ICD-10-CM | POA: Diagnosis not present

## 2024-01-04 DIAGNOSIS — J40 Bronchitis, not specified as acute or chronic: Secondary | ICD-10-CM | POA: Diagnosis not present

## 2024-01-04 DIAGNOSIS — I361 Nonrheumatic tricuspid (valve) insufficiency: Secondary | ICD-10-CM | POA: Diagnosis not present

## 2024-01-04 DIAGNOSIS — R0602 Shortness of breath: Secondary | ICD-10-CM | POA: Diagnosis not present

## 2024-01-04 DIAGNOSIS — I340 Nonrheumatic mitral (valve) insufficiency: Secondary | ICD-10-CM | POA: Diagnosis not present

## 2024-01-04 DIAGNOSIS — Q211 Atrial septal defect, unspecified: Secondary | ICD-10-CM | POA: Diagnosis not present

## 2024-01-04 NOTE — Progress Notes (Signed)
 Established Patient Visit   Chief Complaint: Chief Complaint  Patient presents with  . Follow-up    Follow up cath    Date of Service: 01/04/2024 Date of Birth: 02-28-1965 PCP: Erin Richards  History of Present Illness: Erin Richards is a 58 y.o.female patient who    1.  Status post ASD repair   2.  Status post cleft mitral valve repair  3.  Residual bidirectional ventricular and atrial shunts  4.  Recurrent sinusitis  5.  History of C difficile colitis  6.  Moderate pulmonary hypertension (PA mean 38 mmHg) by [facility] 12/28/2023 (AP)  The patient was seen at [facility] ED 10/12/2019 with chief complaint of chest pain.  ECG revealed sinus rhythm at 63 bpm with right bundle branch block.  High-sensitivity troponin was negative x2.  Chest x-ray was unremarkable.  She described left-sided chest discomfort, which comes and goes, with and without exertion.  2D echocardiogram 09/01/2016 revealed normal left ventricular function, with LVEF of 50%, with moderate aortic insufficiency and tricuspid regurgitation, moderate right ventricular enlargement, right ventricular systolic pressure 51.2 mmHg, similar to prior 2D echocardiogram from 10/10/2014.  Patient was recently briefly hospitalized at [facility] 04/30/2020 with confusional episode felt to be due to hypoglycemia and mild hypotension.  14-day Holter monitor revealed predominant sinus rhythm with mean heart rate of 89 bpm, heart rate range 54 to 136 bpm, occasional premature atrial contractions, and rare episodes of 2-1 conduction type I versus type II secondary AV block the longest episode lasting 3 beats.  She underwent stress echocardiogram earlier 11/21/2019, exercised 4 minutes and 13 seconds on a Bruce protocol, without ischemic ST-T wave changes.  At baseline, 2D echocardiogram revealed normal left ventricular function, with LVEF of 50%, with moderate mitral tricuspid regurgitation.  At peak exercise there was appropriate augmentation of all myocardial segments,  without evidence for scar or ischemia, with estimated LV ejection fraction greater than 55%.  CT scan 03/02/2020 revealed enlarged pulmonary artery consistent with pulmonary hypertension, enlarged bronchial arteries consistent with her known history of ASD and cleft mitral valve repair with residual bidirectional ventricular and atrial shunts.  Ascending aorta measured 3.8 cm.    Cardiac MRI was performed 05/01/2021 which revealed normal left ventricular size and function, LVEF 65%, normal right ventricular size and function, moderately enlarged left atrium, normal-appearing trileaflet aortic valve, thickened mitral valve with history of cleft repair with residual moderate mitral regurgitation, and moderate tricuspid regurgitation.  Residual atrial ventricular septal defect with 1.2 L/min flow detected, likely a sequelae of prior nocardial cushion and AV canal defect repair.  Main and branch pulmonary arteries were mildly enlarged without evidence of stenosis.  The patient was evaluated at [facility] on 04/15/2023 for shortness of breath, treated for bronchitis. 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  The patient underwent right heart catheterization 12/28/2023 which revealed PA mean 38 mmHg consistent with moderate pulmonary hypertension.  The patient returns today for follow-up, and reports doing okay.  She denies exertional chest pain.  She has chronic exertional dyspnea which has been progressive over several months.  She denies palpitations or heart racing.  She denies peripheral edema.  The patient is active, but does not exercise regularly.  Past Medical and Surgical History  Past Medical History Past Medical History:  Diagnosis Date  . Abdominal pain, acute, left lower quadrant 07/25/2013  . BRBPR (bright red blood per rectum) 09/24/2015  .  Concussion 07/03/2013  . Constipation 07/25/2013  . Dizziness 07/03/2013  .  Extremity pain 07/03/2013  . Headache 07/03/2013  . Heart murmur   . History of Clostridium difficile colitis 09/24/2015  . Internal hemorrhoids 2011  . Irritable bowel syndrome   . Irritable bowel syndrome with both constipation and diarrhea 09/24/2015  . Neck pain 07/03/2013  . Ulcerative colitis (CMS/HHS-HCC)     Past Surgical History She has a past surgical history that includes ASD repair; Colonoscopy (05/22/2005); Colonoscopy (12/30/2009); Colonoscopy (08/16/2014); egd (08/16/2014); Colonoscopy (08/27/2017); and COLON @ [facility] (02/19/2023).   Medications and Allergies  Current Medications  Current Outpatient Medications  Medication Sig Dispense Refill  . ACCU-CHEK GUIDE TEST STRIPS test strip USE UP TO FOUR TIMES DAILY AS DIRECTED.    SABRA ACCU-CHEK SOFTCLIX LANCETS lancets USE TO CHECK BLOOD SUGAR DAILY    . albuterol  90 mcg/actuation inhaler Inhale into the lungs.    . azelastine  (ASTELIN ) 137 mcg nasal spray 1 spray by Nasal route.    SABRA buPROPion (WELLBUTRIN SR) 150 MG SR tablet TAKE 1 BY MOUTH EVERY DAY IN THE MORNING    . citalopram  (CELEXA ) 20 MG tablet TAKE 0.5 TABLETS (10 MG TOTAL) BY MOUTH DAILY.  5  . clonazePAM (KLONOPIN) 0.5 MG tablet 1 BY MOUTH DAILY AS NEEDED FOR SEVERE ANXIETY (THIS IS A 32-MONTH SUPPLY)    . escitalopram  oxalate (LEXAPRO ) 10 MG tablet Take 10 mg by mouth at bedtime    . estradiol  (ESTRACE ) 0.5 MG tablet Take 0.5 mg by mouth once daily.    . flunisolide (NASAREL) 29 mcg (0.025 %) nasal spray Place 2 sprays into both nostrils once daily as needed.     . fluticasone  (FLONASE ) 50 mcg/actuation nasal spray Place 2 sprays into both nostrils once daily as needed.     SABRA ipratropium (ATROVENT ) 0.03 % nasal spray Place 2 sprays into both nostrils once daily as needed.     SABRA ipratropium-albuterol  (COMBIVENT ) 18-103 mcg/actuation inhaler Inhale 2 inhalations into the lungs 4 (four) times daily as needed for Wheezing.    SABRA levocetirizine (XYZAL ) 5 MG tablet Take 1 tablet  (5 mg total) by mouth every evening    . montelukast  (SINGULAIR ) 10 mg tablet Take 10 mg by mouth once daily as needed.     . nortriptyline  (PAMELOR ) 50 MG capsule Take 1 capsule (50 mg total) by mouth at bedtime 30 capsule 3  . ondansetron  (ZOFRAN ) 4 MG tablet TAKE 1 TABLET BY MOUTH EVERY 8 HOURS AS NEEDED FOR NAUSEA AND VOMITING    . pantoprazole  (PROTONIX ) 40 MG DR tablet Take 1 tablet (40 mg total) by mouth once daily 90 tablet 3  . predniSONE  (DELTASONE ) 10 MG tablet 6 tablets for 2 days, then 5 for 2 days, then 4 for 2 days, then 3 for 2 days, then 2 for 2 days, then 1 for 2 days.    . QVAR  REDIHALER 80 mcg/actuation inhaler Inhale 1 Puff into the lungs 2 (two) times daily    . spironolactone (ALDACTONE) 25 MG tablet Take 0.5 tablets (12.5 mg total) by mouth once daily 15 tablet 1  . tenapanor (IBSRELA) 50 mg Tab Take 50 mg by mouth 2 (two) times daily 60 tablet 3  . tiotropium (SPIRIVA ) 18 mcg inhalation capsule Place 18 mcg into inhaler and inhale once daily    . UBRELVY  50 mg Tab TAKE 50 MG BY MOUTH ONCE AS NEEDED FOR UP TO 1 DOSE (MIGRAINES, HEADACHES).    . zolpidem  (  AMBIEN ) 5 MG tablet Take 5 mg by mouth at bedtime as needed for Sleep     No current facility-administered medications for this visit.    Allergies: Aspartame, Avelox [moxifloxacin], Canasa [mesalamine], Cetylpyridinium chloride, Chlorhexidine , Clindamycin , Clindamycin  hcl, Codeine, Colox [psyllium husk-laxative no.1], Dextromethorphan hbr, Dimethicone, Doxycycline , Fentanyl , Flagyl  [metronidazole  hcl], Fluticasone  propion-salmeterol, Germanium, Guaifenesin , Hydrocodone, Hydrocodone-acetaminophen , Hydromorphone  (bulk), Ketorolac , Levaquin  [levofloxacin ], Macrobid [nitrofurantoin monohyd/m-cryst], Metformin, Metronidazole , Morphine , Morphine  sulfate, Moxifloxacin hcl, Nsaids (non-steroidal anti-inflammatory drug), Other, Oxycodone , Periogard  [chlorhexidine  gluconate], Permethrin , Phenothiazines, Pioglitazone, Ponstel [mefenamic  acid], Quinolones, Sucralfate , Tetracycline, Tetracyclines, Tylenol  w codeine [acetaminophen -codeine], Ultram  [tramadol ], Vibramycin  [doxycycline  calcium], Vit a-d3-e-aloe vera-zinc , Yellow dock (rumex crispus), Biaxin [clarithromycin], Buprenorphine hcl, Doxycycline  monohydrate, Hydromorphone , Lincomycin hcl, Nitrofurantoin, Oxycodone -acetaminophen , Phenylalanine, Quinidine polygalacturonate, and Tussionex [hydrocodone-chlorpheniramine]  Social and Family History  Social History  reports that she has never smoked. She has never used smokeless tobacco. She reports that she does not drink alcohol and does not use drugs.  Family History Family History  Problem Relation Name Age of Onset  . No Known Problems Mother    . Pancreatic cancer Father    . Breast cancer Sister    . No Known Problems Sister STEP   . No Known Problems Son    . No Known Problems Son      Review of Systems   Review of Systems: The patient denies chest pain, shortness of breath, orthopnea, paroxysmal nocturnal dyspnea, pedal edema, with episodic palpitations, heart racing, without presyncope, syncope, with anxiety attacks. Review of 10 Systems is negative except as described above.  Physical Examination   Vitals: BP 122/80   Pulse 102   Ht 147.3 cm (4' 10)   Wt 70.4 kg (155 lb 3.2 oz)   SpO2 91% Comment: pt has on nail polish  BMI 32.44 kg/m  Ht:147.3 cm (4' 10) Wt:70.4 kg (155 lb 3.2 oz) ADJ:Anib surface area is 1.7 meters squared. Body mass index is 32.44 kg/m.  General: Alert and oriented. Well-appearing. No acute distress. HEENT: Pupils equally reactive to light and accomodation    Neck: Supple, no JVD Lungs: Normal effort of breathing; clear to auscultation bilaterally; no wheezes, rales, rhonchi Heart: Regular rate and rhythm.  Grade 2/6 mid-peaking systolic murmur Abdomen:  with normal bowel sounds Extremities: no cyanosis, clubbing, or edema Peripheral Pulses: 2+ radial Skin: Warm, dry, no  diaphoresis  Assessment   58 y.o. female with  1. ASD (atrial septal defect) (HHS-HCC)   2. Nonrheumatic mitral valve regurgitation   3. Nonrheumatic tricuspid valve regurgitation   4. SOB (shortness of breath) on exertion   5. Bronchitis     59 year old female with known congenital heart disease, status post ASD and cleft mitral valve repair, overall appears clinically stable with chronic exertional dyspnea, which is unchanged, without peripheral edema or syncope.  Patient was seen at [facility] ED 10/12/2019 with new onset left-sided chest pain, with typical and atypical features, nondiagnostic ECG, high-sensitivity troponin negative x 2.  Stress echocardiogram 11/21/2019 revealed normal left ventricular function, without evidence for scar or ischemia.  Recent CT scan revealed enlarged pulmonary and bronchial arteries consistent with pulmonary hypertension which is consistent with her known congenital heart disease status post ASD and cleft mitral valve repair. Ascending aorta measured 3.8 cm. Patient recently hospitalized for confusional episode felt to be secondary to hypoglycemia.  14-day Holter monitor revealed predominant sinus rhythm with occasional PACs and rare episodes of 2-1 conduction, type I versus type II second-degree AV block the longest episode lasting only 3 beats.  Cardiac MRI revealed normal left ventricular and right ventricular size and function.  There is a residual atrial ventricular septal defect with flow of 1.2 L/min, likely a sequelae of prior endocardial cushion and AV canal defect repair, with normal appearing aortic valve, thickened mitral valve with history of cleft repair with residual moderate mitral regurgitation, with moderate tricuspid regurgitation, and mildly enlarged main and branch pulmonary arteries.  Right heart catheterization 12/28/2023 revealed PA 58/24, mean 38 mmHg consistent with moderate pulmonary hypertension.  Plan   1.  Continue current medications 2.   Counseled patient about low-sodium diet  3.  DASH diet print instructions given to the patient 4.  Follow-up with Dr. Isadora 01/05/2024 5.  Appointment with [facility] 01/12/2024 6.  Return to clinic for follow-up in 2 months  No orders of the defined types were placed in this encounter.   Return in about 2 months (around 03/06/2024).   MARSA DOOMS, MD PhD [facility]

## 2024-01-05 ENCOUNTER — Encounter: Payer: Self-pay | Admitting: Student in an Organized Health Care Education/Training Program

## 2024-01-05 ENCOUNTER — Ambulatory Visit: Admitting: Student in an Organized Health Care Education/Training Program

## 2024-01-05 VITALS — BP 128/60 | HR 85 | Temp 97.6°F | Ht 59.0 in | Wt 155.0 lb

## 2024-01-05 DIAGNOSIS — I340 Nonrheumatic mitral (valve) insufficiency: Secondary | ICD-10-CM

## 2024-01-05 DIAGNOSIS — I272 Pulmonary hypertension, unspecified: Secondary | ICD-10-CM | POA: Diagnosis not present

## 2024-01-05 DIAGNOSIS — E669 Obesity, unspecified: Secondary | ICD-10-CM

## 2024-01-05 DIAGNOSIS — J4540 Moderate persistent asthma, uncomplicated: Secondary | ICD-10-CM

## 2024-01-05 DIAGNOSIS — Q211 Atrial septal defect, unspecified: Secondary | ICD-10-CM | POA: Diagnosis not present

## 2024-01-05 NOTE — Patient Instructions (Signed)
  VISIT SUMMARY: Today, we discussed your ongoing symptoms of shortness of breath and fatigue, which are related to your congenital heart defects. We reviewed your recent cardiac evaluations and current medications. We also talked about your asthma and weight management.  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 We will refer you to a heart failure specialist for further management. Continue taking spironolactone 12.5 mg daily. Additionally, make dietary changes to reduce salt intake, avoid sugary drinks, and minimize processed foods. Weight loss through diet and exercise as tolerated is also encouraged.  -MODERATE PERSISTENT ASTHMA: Asthma is a condition where your airways narrow and swell, making it difficult to breathe. Your asthma is currently well-controlled with your inhaler therapy. Continue using your inhaler as prescribed.  -OBESITY: Obesity means having an excess amount of body fat, which can contribute to your pulmonary hypertension. Weight gain may be due to medications like prednisone  and decreased physical activity. We recommend dietary changes to reduce caloric intake, focusing on reducing processed foods and sugary drinks. Weight loss through diet and exercise as tolerated is also encouraged.  INSTRUCTIONS: Please follow up with the heart failure specialist as referred. Continue your current medications and inhaler therapy. Make the recommended dietary changes and try to incorporate exercise as tolerated. If you experience any new or worsening symptoms, please contact our office.    Contains text generated by [employer].

## 2024-01-05 NOTE — Progress Notes (Signed)
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 MCG/ACT nasal spray Place 2 sprays into both nostrils daily as needed for allergies or rhinitis. 48 mL 1   fluticasone -salmeterol (WIXELA INHUB) 250-50 MCG/ACT AEPB Inhale 1 puff into the lungs in the morning and at bedtime. 60 each 11   glucose blood (ACCU-CHEK GUIDE TEST) test strip Use as instructed to check blood sugar daily 100 each 12   hydrOXYzine (ATARAX) 25 MG tablet Take 25 mg by mouth See admin instructions. 3-4 times daily as needed     hyoscyamine  (LEVSIN ) 0.125 MG tablet TAKE ONE TABLET BY MOUTH EVERY 6 HOURS AS NEEDED FOR STOMACH CRAMPINGS 20 tablet 3   ipratropium (ATROVENT ) 0.06 % nasal spray Place 2 sprays into both nostrils 4 (four) times daily. (Patient taking differently: Place 2 sprays into both nostrils as needed. PRN) 15 mL 12   levocetirizine (XYZAL ) 5 MG tablet TAKE 1 TABLET BY MOUTH EVERY DAY IN THE EVENING 30 tablet 5   MISCELLANEOUS VAGINAL PRODUCTS VA Place 1 capsule vaginally daily at 12 noon. [employer] Vaginal Probiotic     montelukast  (SINGULAIR ) 10 MG tablet TAKE 1 TABLET  BY MOUTH EVERYDAY AT BEDTIME 90 tablet 3   Multiple Vitamins-Minerals (VITAFUSION MULTI WOMENS PO) Take 2 each by mouth daily at 12 noon.     nortriptyline  (PAMELOR ) 25 MG capsule TAKE ONE CAPSULE AT BEDTIME FOR MIGRANE PREVENTION AND IRRITABLE BOWEL SYNDROME 90 capsule 3   OVER THE COUNTER MEDICATION Take 1 capsule by mouth daily at 12 noon. Doterra Seasonal Blend     OVER THE COUNTER MEDICATION Take 1 capsule by mouth daily at 12 noon. Doterra On Guard Protective Blend     OVER THE COUNTER MEDICATION Take 1 capsule by mouth daily at 12 noon. Nattura Calm Lavendar Oil     OXYGEN  Inhale 2 L into the lungs at bedtime as needed.     Probiotic Product (PROBIOTIC DAILY PO) Take 1 capsule by mouth daily at 12 noon.     promethazine -dextromethorphan (PROMETHAZINE -DM) 6.25-15 MG/5ML syrup TAKE 2.5 MILLILITERS BY MOUTH 4 TIMES A DAY AS NEEDED FOR COUGH (ALLERGY TO DEXTROMETHORPHAN?) (Patient taking differently: 4 (four) times daily as needed for cough.) 120 mL 0   RYALTRIS 665-25 MCG/ACT SUSP Place into both nostrils. (Patient taking differently: Place into both nostrils as needed.)     Spacer/Aero-Holding Chambers (AEROCHAMBER PLUS) inhaler Use with inhaler 1 each 2   spironolactone (ALDACTONE) 25 MG tablet Take 12.5 mg by mouth daily.     Turmeric Curcumin 500 MG CAPS Take 1 tablet by mouth daily at 12 noon.     UBRELVY  50 MG TABS TAKE 50 MG BY MOUTH ONCE AS NEEDED FOR UP TO 1 DOSE (MIGRAINES, HEADACHES). 10 tablet 5   umeclidinium bromide  (INCRUSE ELLIPTA ) 62.5 MCG/ACT AEPB Inhale 1 puff into the lungs daily. 30 each 12   VENTOLIN  HFA 108 (90 Base) MCG/ACT inhaler Inhale 2 puffs into the lungs every 6 (six) hours as needed for wheezing or shortness of breath. 18 g 3   predniSONE  (DELTASONE ) 10 MG tablet 6 tablets for 2 days, then 5  for 2 days, then 4 for 2 days, then 3 for 2 days, then 2 for 2 days, then 1 for 2 days. (Patient not taking: Reported on 01/05/2024) 42 tablet 0   magic mouthwash SOLN 1 part  nystain: 1 part 2% viscous lidocaine : 1 part Maalox: 1 part diphenhydramine  12.5 mg per 5 ml elixir. Rinse and spit 5 ml every six hour as needed 60 mL 1   zolpidem  (AMBIEN  CR) 6.25 MG CR tablet Take 6.25 mg by mouth at bedtime as needed.     No facility-administered medications prior to visit.

## 2024-01-07 ENCOUNTER — Ambulatory Visit: Payer: Self-pay

## 2024-01-07 ENCOUNTER — Encounter: Payer: Self-pay | Admitting: Family Medicine

## 2024-01-07 NOTE — Telephone Encounter (Signed)
 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 She needs referral to orthopedist.

## 2024-01-07 NOTE — Telephone Encounter (Signed)
 FYI Only or Action Required?: Action required by provider: update on patient condition and lab or test result follow-up needed.  Patient was last seen in primary care on 12/22/2023 by Gasper Nancyann BRAVO, MD.  Called Nurse Triage reporting Back Pain.  Symptoms began several weeks ago.  Interventions attempted: OTC medications: tylenol , ibuprofen , Prescription medications: prednisone , and Ice/heat application.  Symptoms are: gradually worsening.  Triage Disposition: See HCP Within 4 Hours (Or PCP Triage)  Patient/caregiver understands and will follow disposition?: No, wishes to speak with PCP               Copied from CRM [id number]. Topic: Clinical - Red Word Triage >> Jan 07, 2024  1:22 PM Amy B wrote: Red Word that prompted transfer to Nurse Triage: Pain lower back radiating to right leg Reason for Disposition  [1] SEVERE back pain (e.g., excruciating, unable to do any normal activities) AND [2] not improved 2 hours after pain medicine  Answer Assessment - Initial Assessment Questions 1. ONSET: When did the pain begin? (e.g., minutes, hours, days)     12/19/23.  2. LOCATION: Where does it hurt? (upper, mid or lower back)     Lower back.  3. SEVERITY: How bad is the pain?  (e.g., Scale 1-10; mild, moderate, or severe)     9/10.  4. PATTERN: Is the pain constant? (e.g., yes, no; constant, intermittent)      Constant.  5. RADIATION: Does the pain shoot into your legs or somewhere else?     Shoots down the right leg.  6. CAUSE:  What do you think is causing the back pain?      Xray showed spurs.  7. BACK OVERUSE:  Any recent lifting of heavy objects, strenuous work or exercise?     No.  8. MEDICINES: What have you taken so far for the pain? (e.g., nothing, acetaminophen , NSAIDS)     Tylenol , ibuprofen , heating pad, prednisone .  9. NEUROLOGIC SYMPTOMS: Do you have any weakness, numbness, or problems with bowel/bladder control?     No.  10. OTHER  SYMPTOMS: Do you have any other symptoms? (e.g., fever, abdomen pain, burning with urination, blood in urine)       No.  Protocols used: Back Pain-A-AH

## 2024-01-08 ENCOUNTER — Other Ambulatory Visit: Payer: Self-pay | Admitting: Family Medicine

## 2024-01-08 ENCOUNTER — Encounter: Payer: Self-pay | Admitting: Family Medicine

## 2024-01-08 DIAGNOSIS — J329 Chronic sinusitis, unspecified: Secondary | ICD-10-CM

## 2024-01-10 NOTE — Telephone Encounter (Signed)
 Orthopedist should be able to offer some treatments. She is allergic to all the pain medications we usually prescribe. We can try a muscle relaxer like Robaxin  (methocarbamol ) is she allergic to muscle relaxers? If not I can send in a prescription.

## 2024-01-11 ENCOUNTER — Other Ambulatory Visit: Payer: Self-pay | Admitting: Family Medicine

## 2024-01-11 MED ORDER — MELOXICAM 15 MG PO TABS: 15.0000 mg | ORAL_TABLET | Freq: Every day | ORAL | 0 refills | Status: AC

## 2024-01-11 MED ORDER — MELOXICAM 15 MG PO TABS: 15.0000 mg | ORAL_TABLET | Freq: Every day | ORAL | 0 refills | Status: DC

## 2024-01-11 NOTE — Addendum Note (Signed)
 Addended by: GASPER NANCYANN BRAVO on: 01/11/2024 01:22 PM   Modules accepted: Orders

## 2024-01-15 DIAGNOSIS — Q121 Congenital displaced lens: Secondary | ICD-10-CM | POA: Diagnosis not present

## 2024-01-15 DIAGNOSIS — R0902 Hypoxemia: Secondary | ICD-10-CM | POA: Diagnosis not present

## 2024-01-17 DIAGNOSIS — F4312 Post-traumatic stress disorder, chronic: Secondary | ICD-10-CM | POA: Diagnosis not present

## 2024-01-17 DIAGNOSIS — F331 Major depressive disorder, recurrent, moderate: Secondary | ICD-10-CM | POA: Diagnosis not present

## 2024-01-17 DIAGNOSIS — F5102 Adjustment insomnia: Secondary | ICD-10-CM | POA: Diagnosis not present

## 2024-01-17 DIAGNOSIS — F411 Generalized anxiety disorder: Secondary | ICD-10-CM | POA: Diagnosis not present

## 2024-01-18 ENCOUNTER — Other Ambulatory Visit: Payer: Self-pay

## 2024-01-18 DIAGNOSIS — R0789 Other chest pain: Secondary | ICD-10-CM | POA: Diagnosis not present

## 2024-01-18 DIAGNOSIS — Q211 Atrial septal defect, unspecified: Secondary | ICD-10-CM | POA: Diagnosis not present

## 2024-01-18 DIAGNOSIS — R0602 Shortness of breath: Secondary | ICD-10-CM | POA: Diagnosis not present

## 2024-01-18 NOTE — Patient Instructions (Signed)
 Visit Information  Erin Richards was given information about Medicaid Managed Care team care coordination services as a part of their Healthy [facility] Medicaid benefit. Erin Richards   If you would like to schedule transportation through your Healthy [facility] plan, please call the following number at least 2 days in advance of your appointment: [phone]  For information about your ride after you set it up, call Ride Assist at [phone]. Use this number to activate a Will Call pickup, or if your transportation is late for a scheduled pickup. Use this number, too, if you need to make a change or cancel a previously scheduled reservation.  If you need transportation services right away, call [phone]. The after-hours call center is staffed 24 hours to handle ride assistance and urgent reservation requests (including discharges) 365 days a year. Urgent trips include sick visits, hospital discharge requests and life-sustaining treatment.  Call the [facility] Line at [phone], at any time, 24 hours a day, 7 days a week. If you are in danger or need immediate medical attention call 911.  CALL CUSTOMER SERVICE NUMBER to determine Asthma benefit eligibility.   Please see education materials related to pulmonary HTN and Heart Failure Action Plan provided by MyChart link.  Patient verbalizes understanding of instructions and care plan provided today and agrees to view in MyChart. Active MyChart status and patient understanding of how to access instructions and care plan via MyChart confirmed with patient.     Telephone follow up appointment with Managed Medicaid care management team member scheduled for: 02/17/2024 at 10:00 am  [name], MSN, RN [place]  [facility] RN Care Manager Direct Dial: [phone] Fax: [phone]   Following is a copy of your plan of care:   Goals Addressed             This Visit's Progress     VBCI RN Care Plan - PulmHTN/HF       Problems:  Chronic Disease Management support and education needs related to CHF/Pulmonary HTN  Goal: Over the next 90 days the Patient will attend all scheduled medical appointments: Patient will attend all appointments as evidenced by chart review        continue to work with RN Care Manager and/or Social Worker to address care management and care coordination needs related to CHF and Pulmonary HTN as evidenced by adherence to care management team scheduled appointments     take all medications exactly as prescribed and will call provider for medication related questions as evidenced by patient report    verbalize basic understanding of CHF and Pulmonary HTN disease process and self health management plan as evidenced by Taking medications as prescribed and as needed including newly prescribed prn Lasix , promptly reporting changes/concerns to provider, following HF Action Plan, complete all ordered testing, follow heart healthy low salt diet, exercise as tolerated O2 2L at night and prn during day, notifying provider for increased need for O2 during the day.    Interventions:   Heart Failure Interventions: Basic overview and discussion of pathophysiology of Heart Failure reviewed Provided education on low sodium diet Reviewed Heart Failure Action Plan in depth and provided written copy Advised patient to weigh each morning after emptying bladder Discussed importance of daily weight and advised patient to weigh and record daily Reviewed role of diuretics in prevention of fluid overload and management of heart failure; Discussed the importance of keeping all appointments with provider Screening for signs and symptoms of  depression related to chronic disease state  Assessed social determinant of health barriers   Patient Self-Care Activities:  Attend all scheduled provider appointments Call pharmacy for medication refills 3-7 days in advance of running out  of medications Call provider office for new concerns or questions  Take medications as prescribed   call office if I gain more than 2 pounds in one day or 5 pounds in one week do ankle pumps when sitting keep legs up while sitting track weight in diary use salt in moderation watch for swelling in feet, ankles and legs every day weigh myself daily follow rescue plan if symptoms flare-up eat more whole grains, fruits and vegetables, lean meats and healthy fats know when to call the doctor:per provider instructions and HF Action Plan  Plan:  Telephone follow up appointment with care management team member scheduled for:  02/17/2024 at 10:00 am          COMPLETED: VBCI RN Care Plan - Subacute Cough/Fatigue       Problems:  Chronic Disease Management support and education needs related to Subacute Cough  Goal: Over the next 90 days the Patient will attend all scheduled medical appointments: Patient will attend appoontments as evidenced by chart review        continue to work with RN Care Manager and/or Social Worker to address care management and care coordination needs related to Subacute Cough/Fatigue as evidenced by adherence to care management team scheduled appointments     take all medications exactly as prescribed and will call provider for medication related questions as evidenced by patient reporting compliance with all meds    verbalize basic understanding of Subacute Cough and fatigue disease process and self health management plan as evidenced by take all medications as prescribed, report changes in symptoms to provider immediately, utilize techniques to manage cough and fatigue.   Interventions:   Evaluation of current treatment plan related to Subacute cough and fatigue, self-management and patient's adherence to plan as established by provider. Discussed plans with patient for ongoing care management follow up and provided patient with direct contact information for care  management team Advised patient to hydrate with water  more frequently (drinks soda) Provided education to patient re: energy conservation, managing fatigue, chronic cough Screening for signs and symptoms of depression related to chronic disease state - patient in counseling and has psychiatrist Assessed social determinant of health barriers - no needs RNCM notifying provider patient continues with green sputum and cough unchanged. Made aware patient should not wait for upcoming appointments to discuss concerns. Conference call to schedule patient with provider  - 11/04/23 1020 am Patient instructed to call [facility] provider for refills on meds as discussed. Reviewed process/timing for requesting refills from any provider  Discussed GERD diet to potentially help cough Reviewed UC/ED visits and PCP follow up with patient. Reports only slight improvement overall and ongoing fatigue. Has appointment with Pulmonary 12/08/23. Red flags for ED discussed.  Discussed upcoming Cardiac Cath 12/28/23 for Pulmonary HTN - patient has instructions for before test and has ride. Educatoin provided re Pulmonary HTN and connection to fatigue.  Patient with Pulmonary HTN - completing this care plan and creating new for Pulmonary HTN  Patient Self-Care Activities:  Attend all scheduled provider appointments Call pharmacy for medication refills 3-7 days in advance of running out of medications Call provider office for new concerns or questions  Take medications as prescribed   Patient applied for disability as discussed prior.   Plan:  Telephone  follow up appointment with care management team member scheduled for:  01/18/2024 at 1:00 pm               Heart Failure Action Plan A heart failure action plan helps you know what to do when you have symptoms of heart failure. Your action plan is a color-coded plan that lists the symptoms to watch for and indicates what actions to take. If you have symptoms in the green  zone, you're doing well. If you have symptoms in the yellow zone, you're having problems. If you have symptoms in the red zone, you need medical care right away. Follow the plan that was created by you and your health care provider. Review your plan each time you visit your provider. Green zone These signs mean you're doing well and can continue what you're doing: You don't have new or worsening shortness of breath. You have very little swelling or no new swelling. Your weight is stable (no gain or loss). You have a normal activity level. You don't have chest pain or any other new symptoms. Yellow zone These signs and symptoms mean your condition may be getting worse and you should make some changes: You have trouble breathing when you're active. You have swelling in your feet or legs or have discomfort in your belly. You gain 2-3 lb (0.9-1.4 kg) in 24 hours, or 5 lb (2.3 kg) in a week. This amount may be more or less depending on your condition. You get tired easily. You have trouble sleeping. You have a dry cough. If you have any of these symptoms: Contact your provider within the next day. Your provider may adjust your medicines. Red zone These signs and symptoms mean you should get medical help right away: You have trouble breathing when resting or cannot lie flat and you need to raise your head to help you breathe. You have a dry cough that's getting worse. You have swelling or pain in your feet or legs or discomfort in your belly that's getting worse. You suddenly gain more than 2-3 lb (0.9-1.4 kg) in 24 hours, or more than 5 lb (2.3 kg) in a week. This amount may be more or less depending on your condition. You have trouble staying awake or you feel confused. You don't have an appetite. You have worsening sadness or depression. These symptoms may be an emergency. Call 911 right away. Do not wait to see if the symptoms will go away. Do not drive yourself to the  hospital. Follow these instructions at home: Take medicines only as told. Eat a heart-healthy diet. Work with a dietitian to create an eating plan that's best for you. Weigh yourself each day. Your target weight is __________ lb (__________ kg). Call your provider if you gain more than __________ lb (__________ kg) in 24 hours, or more than __________ lb (__________ kg) in a week. Health care provider name: _____________________________________________________ Health care provider phone number: _____________________________________________________ Where to find more information American Heart Association: heart.org This information is not intended to replace advice given to you by your health care provider. Make sure you discuss any questions you have with your health care provider. Document Revised: 09/03/2022 Document Reviewed: 09/03/2022 Elsevier Patient Education  2024 Elsevier Inc.    Pulmonary Hypertension Pulmonary hypertension is a condition that causes high blood pressure in the blood vessels of your lungs. This makes your heart work extra hard to pump blood to your lungs. And when your heart has to work harder, it  can be harder for you to breathe. Over time, this can hurt and weaken your heart. What are the causes? This condition can be put into one of five groups based on what causes it. Group 1 happens when the blood vessels that carry blood to your lungs get too thick or stiff. This may happen with no known cause or it may be: Inherited. This means it's passed from parent to child. Caused by another disease, such as a disease of the heart or liver. Caused by some medicines or poisons. Group 2 happens if you have problems with the valves in your heart or if the left side of your heart, also called your left ventricle, gets weak. Group 3 can be caused by long-term diseases of the lungs, such as chronic obstructive pulmonary disease or COPD. This can also happen if you don't get  enough oxygen , such as if you have trouble breathing when you sleep or if you live at a high altitude. Group 4 is caused by blood clots in your lungs. Group 5 includes other causes, such as sickle cell anemia or growths of cells that aren't normal called tumors. What are the signs or symptoms? Symptoms may include: Shortness of breath. A cough. Tiredness. Feeling dizzy or light-headed. You may also faint. A fast heart beat. You may also feel your heart flutter or skip a beat. Your lips or fingers turning blue. Chest pain or tightness. How is this diagnosed? This condition may be diagnosed with: Blood tests. Imaging tests. These may include: Chest X-rays. CT scans. An echocardiogram. This is an ultrasound of your heart. A ventilation-perfusion scan. This sees how well air and blood flow in and out of your lungs. A pulmonary function test. This looks at how much air your lungs can hold. A 6-minute walk test. This may be done to check your breathing during exercise. Cardiac catheterization. This is a procedure that uses a soft tube called a catheter to check the arteries of your heart. A lung biopsy. This is when a small piece of tissue is removed from your lung for testing. How is this treated? There's no cure for this condition. But treatment can help you feel better and can slow the condition down. You may need: Oxygen  therapy. Cardiac rehabilitation, or rehab. This is a program that teaches you how to: Care for your heart. Exercise. Go back to your normal activities. Medicines to: Lower your blood pressure. Relax the blood vessels in your lungs. Help your heart pump more blood. Help your body get rid of extra fluid. Thin your blood. This can stop you from getting blood clots. Lung surgery. This can relieve pressure on your heart. You may need this if other treatments don't work. Heart-lung or lung transplant. This may be done in very severe cases. Follow these instructions at  home: Eating and drinking  Eat a healthy diet. Eat lots of fresh fruits and vegetables, whole grains, and beans. Limit how much salt you take in to less than 2,300 mg a day. Salt is also called sodium. Activity Get lots of rest. Exercise as told. Ask your health care provider what types of exercise are safe for you. Lifestyle Do not use any products that contain nicotine or tobacco. These products include cigarettes, chewing tobacco, and vaping devices, such as e-cigarettes. If you need help quitting, ask your provider. Stay away when other people smoke. Do not sit in hot tubs or saunas for long stretches of time. Do not get pregnant. If needed,  talk with your provider about birth control. Avoid high altitudes. It can be stressful to live with pulmonary hypertension. Talk with your provider about finding support groups and online help. General instructions Take over-the-counter and prescription medicines only as told by your provider. Do not change or stop medicines without talking with your provider. Stay up to date on your shots, such as your yearly flu shot and pneumonia shot. Use oxygen  therapy at home as told. Keep all follow-up visits. Your provider will check your breathing and make changes to your medicines as needed. Contact a health care provider if: Your cough gets worse. You have more shortness of breath. You start to have trouble doing things you could do before. You need to use more medicines or oxygen , or you need to use them more often than normal. Get help right away if: You have severe shortness of breath. You have chest pain or pressure. You cough up blood. These symptoms may be an emergency. Get help right away. Call 911. Do not wait to see if the symptoms will go away. Do not drive yourself to the hospital. This information is not intended to replace advice given to you by your health care provider. Make sure you discuss any questions you have with your health  care provider. Document Revised: 04/06/2022 Document Reviewed: 04/06/2022 Elsevier Patient Education  2024 Elsevier Inc.  DASH Eating Plan DASH stands for Dietary Approaches to Stop Hypertension. The DASH eating plan is a healthy eating plan that has been shown to: Lower high blood pressure (hypertension). Reduce your risk for type 2 diabetes, heart disease, and stroke. Help with weight loss. What are tips for following this plan? Reading food labels Check food labels for the amount of salt (sodium) per serving. Choose foods with less than 5 percent of the Daily Value (DV) of sodium. In general, foods with less than 300 milligrams (mg) of sodium per serving fit into this eating plan. To find whole grains, look for the word whole as the first word in the ingredient list. Shopping Buy products labeled as low-sodium or no salt added. Buy fresh foods. Avoid canned foods and pre-made or frozen meals. Cooking Try not to add salt when you cook. Use salt-free seasonings or herbs instead of table salt or sea salt. Check with your health care provider or pharmacist before using salt substitutes. Do not fry foods. Cook foods in healthy ways, such as baking, boiling, grilling, roasting, or broiling. Cook using oils that are good for your heart. These include olive, canola, avocado, soybean, and sunflower oil. Meal planning  Eat a balanced diet. This should include: 4 or more servings of fruits and 4 or more servings of vegetables each day. Try to fill half of your plate with fruits and vegetables. 6-8 servings of whole grains each day. 6 or less servings of lean meat, poultry, or fish each day. 1 oz is 1 serving. A 3 oz (85 g) serving of meat is about the same size as the palm of your hand. One egg is 1 oz (28 g). 2-3 servings of low-fat dairy each day. One serving is 1 cup (237 mL). 1 serving of nuts, seeds, or beans 5 times each week. 2-3 servings of heart-healthy fats. Healthy fats called  omega-3 fatty acids are found in foods such as walnuts, flaxseeds, fortified milks, and eggs. These fats are also found in cold-water  fish, such as sardines, salmon, and mackerel. Limit how much you eat of: Canned or prepackaged foods. Food that  is high in trans fat, such as fried foods. Food that is high in saturated fat, such as fatty meat. Desserts and other sweets, sugary drinks, and other foods with added sugar. Full-fat dairy products. Do not salt foods before eating. Do not eat more than 4 egg yolks a week. Try to eat at least 2 vegetarian meals a week. Eat more home-cooked food and less restaurant, buffet, and fast food. Lifestyle When eating at a restaurant, ask if your food can be made with less salt or no salt. If you drink alcohol: Limit how much you have to: 0-1 drink a day if you are female. 0-2 drinks a day if you are female. Know how much alcohol is in your drink. In the [place] one drink is one 12 oz bottle of beer (355 mL), one 5 oz glass of wine (148 mL), or one 1 oz glass of hard liquor (44 mL). General information Avoid eating more than 2,300 mg of salt a day. If you have hypertension, you may need to reduce your sodium intake to 1,500 mg a day. Work with your provider to stay at a healthy body weight or lose weight. Ask what the best weight range is for you. On most days of the week, get at least 30 minutes of exercise that causes your heart to beat faster. This may include walking, swimming, or biking. Work with your provider or dietitian to adjust your eating plan to meet your specific calorie needs. What foods should I eat? Fruits All fresh, dried, or frozen fruit. Canned fruits that are in their natural juice and do not have sugar added to them. Vegetables Fresh or frozen vegetables that are raw, steamed, roasted, or grilled. Low-sodium or reduced-sodium tomato and vegetable juice. Low-sodium or reduced-sodium tomato sauce and tomato paste. Low-sodium or  reduced-sodium canned vegetables. Grains Whole-grain or whole-wheat bread. Whole-grain or whole-wheat pasta. Brown rice. [place]neil Madeira. Bulgur. Whole-grain and low-sodium cereals. Pita bread. Low-fat, low-sodium crackers. Whole-wheat flour tortillas. Meats and other proteins Skinless chicken or turkey. Ground chicken or turkey. Pork with fat trimmed off. Fish and seafood. Egg whites. Dried beans, peas, or lentils. Unsalted nuts, nut butters, and seeds. Unsalted canned beans. Lean cuts of beef with fat trimmed off. Low-sodium, lean precooked or cured meat, such as sausages or meat loaves. Dairy Low-fat (1%) or fat-free (skim) milk. Reduced-fat, low-fat, or fat-free cheeses. Nonfat, low-sodium ricotta or cottage cheese. Low-fat or nonfat yogurt. Low-fat, low-sodium cheese. Fats and oils Soft margarine without trans fats. Vegetable oil. Reduced-fat, low-fat, or light mayonnaise and salad dressings (reduced-sodium). Canola, safflower, olive, avocado, soybean, and sunflower oils. Avocado. Seasonings and condiments Herbs. Spices. Seasoning mixes without salt. Other foods Unsalted popcorn and pretzels. Fat-free sweets. The items listed above may not be all the foods and drinks you can have. Talk to a dietitian to learn more. What foods should I avoid? Fruits Canned fruit in a light or heavy syrup. Fried fruit. Fruit in cream or butter sauce. Vegetables Creamed or fried vegetables. Vegetables in a cheese sauce. Regular canned vegetables that are not marked as low-sodium or reduced-sodium. Regular canned tomato sauce and paste that are not marked as low-sodium or reduced-sodium. Regular tomato and vegetable juices that are not marked as low-sodium or reduced-sodium. Dene. Olives. Grains Baked goods made with fat, such as croissants, muffins, or some breads. Dry pasta or rice meal packs. Meats and other proteins Fatty cuts of meat. Ribs. Fried meat. [place]. Bologna, salami, and other precooked or  cured meats,  such as sausages or meat loaves, that are not lean and low in sodium. Fat from the back of a pig (fatback). Bratwurst. Salted nuts and seeds. Canned beans with added salt. Canned or smoked fish. Whole eggs or egg yolks. Chicken or turkey with skin. Dairy Whole or 2% milk, cream, and half-and-half. Whole or full-fat cream cheese. Whole-fat or sweetened yogurt. Full-fat cheese. Nondairy creamers. Whipped toppings. Processed cheese and cheese spreads. Fats and oils Butter. Stick margarine. Lard. Shortening. Ghee. Bacon fat. Tropical oils, such as coconut, palm kernel, or palm oil. Seasonings and condiments Onion salt, garlic salt, seasoned salt, table salt, and sea salt. Worcestershire sauce. Tartar sauce. Barbecue sauce. Teriyaki sauce. Soy sauce, including reduced-sodium soy sauce. Steak sauce. Canned and packaged gravies. Fish sauce. Oyster sauce. Cocktail sauce. Store-bought horseradish. Ketchup. Mustard. Meat flavorings and tenderizers. Bouillon cubes. Hot sauces. Pre-made or packaged marinades. Pre-made or packaged taco seasonings. Relishes. Regular salad dressings. Other foods Salted popcorn and pretzels. The items listed above may not be all the foods and drinks you should avoid. Talk to a dietitian to learn more. Where to find more information National Heart, Lung, and Blood Institute (NHLBI): buffalodrycleaner.gl American Heart Association (AHA): heart.org Academy of Nutrition and Dietetics: eatright.org National Kidney Foundation (NKF): kidney.org This information is not intended to replace advice given to you by your health care provider. Make sure you discuss any questions you have with your health care provider. Document Revised: 02/05/2022 Document Reviewed: 02/05/2022 Elsevier Patient Education  2024 [employer].
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
## 2024-01-19 NOTE — Progress Notes (Signed)
 Established patient visit   Patient: Erin Richards   DOB: April 20, 1965   58 y.o. Female  MRN: [id number] Visit Date: 12/22/2023  Today's healthcare provider: Nancyann Perry, MD   Chief Complaint  Patient presents with   Back Pain    Lower back pain   Hot Flashes   Cough   Subjective    Discussed the use of AI scribe software for clinical note transcription with the patient, who gave verbal consent to proceed.  History of Present Illness   Erin Richards is a 58 year old female who presents with severe lower back pain radiating to the leg.  She has been experiencing severe throbbing pain in the lower back since Sunday, which radiates down her leg. The pain is constant but worsens with sharp episodes that nearly bring her to tears. She needs to sit down immediately when the pain hits to avoid losing her grip, and it feels like her leg might give out. She reports that it hurts when she walks and when she is lying down. She has been using Tylenol  and Lidoderm  patches for relief. No recent falls have occurred.  She uses Advair and Incruse inhalers, and a rescue inhaler as needed. She has developed sore bumps in her mouth, which she attributes to the inhalers. She rinses her mouth and brushes her teeth after using the inhalers. She also experiences a persistent cough with green sputum that sometimes does not release.  She takes trazodone  at night to help with sleep and has previously used tramadol  for pain management. She also uses Zofran  for nausea.       Medications: Show/hide medication list[1] Review of Systems  Constitutional:  Negative for appetite change, chills, fatigue and fever.  Respiratory:  Positive for cough and shortness of breath. Negative for chest tightness.   Cardiovascular:  Negative for chest pain and palpitations.  Gastrointestinal:  Negative for abdominal pain, nausea and vomiting.  Neurological:  Negative for dizziness and weakness.        Objective    BP 97/62 (BP Location: Left Arm, Patient Position: Sitting, Cuff Size: Normal)   Pulse 86   Resp 16   Wt 149 lb (67.6 kg)   SpO2 99%   BMI 30.09 kg/m   Physical Exam   General appearance: Well developed, well nourished female, cooperative and in no acute distress Head: Normocephalic, without obvious abnormality, atraumatic Respiratory: Respirations even and unlabored, normal respiratory rate Extremities: All extremities are intact.  Skin: Skin color, texture, turgor normal. No rashes seen  Psych: Appropriate mood and affect. Neurologic: Mental status: Alert, oriented to person, place, and time, thought content appropriate.    Assessment & Plan        Acute right hip pain Throbbing, severe pain radiating down the leg, worsens with walking. Differential includes pulled muscle or strained ligament. No fracture suspected. - Ordered x-ray of the right hip. - Prescribed tramadol  for pain management. - Advised follow-up with orthopedics if symptoms worsen.  Oral mucosal irritation due to inhaler use Oral mucosal irritation likely from inhaler use, possibly related to immune suppression leading to yeast infections. - Prescribed magic mouth rinse with antibiotic. - Sent prescriptions to pharmacy.         Nancyann Perry, MD  [facility] [phone] (phone) [phone] (fax)  [facility]    [1]  Outpatient Medications Prior to Visit  Medication Sig   Accu-Chek Softclix Lancets lancets Use to check  blood sugar daily   acetaminophen  (TYLENOL ) 500 MG tablet Take 1,000 mg by mouth every 6 (six) hours as needed.   albuterol  (PROVENTIL ) (2.5 MG/3ML) 0.083% nebulizer solution Take 2.5 mg by nebulization every 6 (six) hours as needed for wheezing or shortness of breath.   ALPRAZolam  (XANAX ) 0.5 MG tablet Take 0.5-1 tablets (0.25-0.5 mg total) by mouth 2 (two) times daily as needed (pain attacks).   Azelastine  HCl 137 MCG/SPRAY  SOLN PLACE 1 SPRAY INTO BOTH NOSTRILS 2 (TWO) TIMES DAILY (Patient taking differently: as needed. prn)   Blood Glucose Monitoring Suppl (ACCU-CHEK GUIDE) w/Device KIT Use daily to check blood sugars   buPROPion (WELLBUTRIN SR) 150 MG 12 hr tablet Take 150 mg by mouth every morning.   clonazePAM (KLONOPIN) 0.5 MG tablet Take 0.5 mg by mouth daily as needed.   escitalopram  (LEXAPRO ) 10 MG tablet Take 10 mg by mouth daily.   esomeprazole  (NEXIUM ) 40 MG capsule Take 1 capsule (40 mg total) by mouth daily.   estradiol  (ESTRACE ) 0.5 MG tablet Take 1 tablet (0.5 mg total) by mouth daily.   fluticasone  (FLONASE ) 50 MCG/ACT nasal spray Place 2 sprays into both nostrils daily as needed for allergies or rhinitis.   fluticasone -salmeterol (WIXELA INHUB) 250-50 MCG/ACT AEPB Inhale 1 puff into the lungs in the morning and at bedtime.   glucose blood (ACCU-CHEK GUIDE TEST) test strip Use as instructed to check blood sugar daily   hydrOXYzine (ATARAX) 25 MG tablet Take 25 mg by mouth See admin instructions. 3-4 times daily as needed   hyoscyamine  (LEVSIN ) 0.125 MG tablet TAKE ONE TABLET BY MOUTH EVERY 6 HOURS AS NEEDED FOR STOMACH CRAMPINGS   ipratropium (ATROVENT ) 0.06 % nasal spray Place 2 sprays into both nostrils 4 (four) times daily. (Patient taking differently: Place 2 sprays into both nostrils as needed. PRN)   MISCELLANEOUS VAGINAL PRODUCTS [place] Place 1 capsule vaginally daily at 12 noon. Physcians Choice Vaginal Probiotic   montelukast  (SINGULAIR ) 10 MG tablet TAKE 1 TABLET BY MOUTH EVERYDAY AT BEDTIME   Multiple Vitamins-Minerals (VITAFUSION MULTI WOMENS PO) Take 2 each by mouth daily at 12 noon.   nortriptyline  (PAMELOR ) 25 MG capsule TAKE ONE CAPSULE AT BEDTIME FOR MIGRANE PREVENTION AND IRRITABLE BOWEL SYNDROME   OVER THE COUNTER MEDICATION Take 1 capsule by mouth daily at 12 noon. Doterra Seasonal Blend   OVER THE COUNTER MEDICATION Take 1 capsule by mouth daily at 12 noon. Doterra On Guard Protective  Blend   OVER THE COUNTER MEDICATION Take 1 capsule by mouth daily at 12 noon. Nattura Calm Lavendar Oil   OXYGEN  Inhale 2 L into the lungs at bedtime as needed.   Probiotic Product (PROBIOTIC DAILY PO) Take 1 capsule by mouth daily at 12 noon.   promethazine -dextromethorphan (PROMETHAZINE -DM) 6.25-15 MG/5ML syrup TAKE 2.5 MILLILITERS BY MOUTH 4 TIMES A DAY AS NEEDED FOR COUGH (ALLERGY TO DEXTROMETHORPHAN?) (Patient taking differently: 4 (four) times daily as needed for cough.)   RYALTRIS 665-25 MCG/ACT SUSP Place into both nostrils. (Patient taking differently: Place into both nostrils as needed.)   Spacer/Aero-Holding Chambers (AEROCHAMBER PLUS) inhaler Use with inhaler   spironolactone (ALDACTONE) 25 MG tablet Take 12.5 mg by mouth daily.   Turmeric Curcumin 500 MG CAPS Take 1 tablet by mouth daily at 12 noon.   UBRELVY  50 MG TABS TAKE 50 MG BY MOUTH ONCE AS NEEDED FOR UP TO 1 DOSE (MIGRAINES, HEADACHES).   umeclidinium bromide  (INCRUSE ELLIPTA ) 62.5 MCG/ACT AEPB Inhale 1 puff into the lungs daily.  VENTOLIN  HFA 108 (90 Base) MCG/ACT inhaler Inhale 2 puffs into the lungs every 6 (six) hours as needed for wheezing or shortness of breath.   [DISCONTINUED] levocetirizine (XYZAL ) 5 MG tablet TAKE 1 TABLET BY MOUTH EVERY DAY IN THE EVENING   [DISCONTINUED] ondansetron  (ZOFRAN -ODT) 4 MG disintegrating tablet Take 1 tablet (4 mg total) by mouth every 8 (eight) hours as needed for nausea or vomiting.   [DISCONTINUED] promethazine  (PHENERGAN ) 12.5 MG tablet Take 1 tablet (12.5 mg total) by mouth every 8 (eight) hours as needed for nausea or vomiting. (Patient not taking: Reported on 12/28/2023)   [DISCONTINUED] zolpidem  (AMBIEN  CR) 6.25 MG CR tablet Take 6.25 mg by mouth at bedtime as needed.   No facility-administered medications prior to visit.

## 2024-01-21 ENCOUNTER — Telehealth

## 2024-01-24 ENCOUNTER — Ambulatory Visit: Payer: Self-pay

## 2024-01-24 NOTE — Telephone Encounter (Signed)
 FYI Only or Action Required?: FYI only for provider: appointment scheduled on 01/26/24.  Patient was last seen in primary care on 12/22/2023 by Gasper Nancyann BRAVO, MD.  Called Nurse Triage reporting Groin Pain.  Symptoms began yesterday.  Interventions attempted: OTC medications: tylenol ; ibuprofen  and Ice/heat application.  Symptoms are: unchanged.  Triage Disposition: See Physician Within 24 Hours  Patient/caregiver understands and will follow disposition?: Yes    Copied from CRM #[id number]. Topic: Clinical - Red Word Triage >> Jan 24, 2024  9:41 AM Delon HERO wrote: Red Word that prompted transfer to Nurse Triage: Patient is calling to report sharp pain in groin area that started yesterday that go and come. Patient has to stop walking because they hurt badly. Reason for Disposition  [1] MODERATE (e.g., interferes with normal activities) pelvic pain AND [2] pain comes and goes (cramps) AND [3] present > 24 hours  Answer Assessment - Initial Assessment Questions Pt called in reporting 8/10 pelvic/groin pain that she has tried heat, ibuprofen  and tylenol  since yesterday without relief. Pt reports pain is worse when she walks; shooting pain. Pt denies vaginal bleeding, abdominal cramping, n/v or fever. Pt reports she is constipated and last BM 2 days ago. Discussed soonest available appt 12/24 and encouraged high fiber diet, continue stool softener and additional of OTC daily laxative to aid with BM. Discussed BM could relieve pressure and give her more relief until appt. Pt voiced understanding. Appointment scheduled for evaluation. Patient agrees with plan of care, and will call back if anything changes, or if symptoms worsen.     1. LOCATION: Where does it hurt?      Pelvis area/ groin   2. RADIATION: Does the pain shoot anywhere else? (e.g., lower back, groin, thighs)     It shoots right through when questioned if GI or vaginal pain present   3. ONSET: When did the pain  begin? (e.g., minutes, hours or days ago)      Yesterday   4. SUDDEN: Gradual or sudden onset?     Sudden onset   5. PATTERN Does the pain come and go, or is it constant?     Comes and goes; worse with walking   6. SEVERITY: How bad is the pain?  (e.g., Scale 1-10; mild, moderate, or severe)     8/10  7. RECURRENT SYMPTOM: Have you ever had this type of pelvic pain before? If Yes, ask: When was the last time? and What happened that time?      No   8. CAUSE: What do you think is causing the pelvic pain?     Unsure; pt did report she has been constipated last BM 2 days ago   9. RELIEVING/AGGRAVATING FACTORS: What makes it better or worse? (e.g., activity/rest, sexual intercourse, voiding, passing stool)     Worse: walking; reports nothing makes it better   10. OTHER SYMPTOMS: Has there been any other symptoms? (e.g., fever, constipation, diarrhea, urine problems, vaginal bleeding, vaginal discharge, or vomiting?       Constipation; denies bleeding, n/v  Protocols used: Pelvic Pain - Female-A-AH

## 2024-01-26 ENCOUNTER — Ambulatory Visit: Admitting: Family Medicine

## 2024-02-03 ENCOUNTER — Encounter: Payer: Self-pay | Admitting: Family Medicine

## 2024-02-04 ENCOUNTER — Other Ambulatory Visit: Payer: Self-pay

## 2024-02-04 DIAGNOSIS — N951 Menopausal and female climacteric states: Secondary | ICD-10-CM

## 2024-02-04 DIAGNOSIS — Z79890 Hormone replacement therapy (postmenopausal): Secondary | ICD-10-CM

## 2024-02-04 MED ORDER — ESTRADIOL 0.5 MG PO TABS: 0.5000 mg | ORAL_TABLET | Freq: Every day | ORAL | 1 refills | Status: AC

## 2024-02-07 ENCOUNTER — Ambulatory Visit: Payer: Self-pay | Admitting: Student in an Organized Health Care Education/Training Program

## 2024-02-07 ENCOUNTER — Ambulatory Visit: Admitting: Student in an Organized Health Care Education/Training Program

## 2024-02-07 ENCOUNTER — Encounter: Payer: Self-pay | Admitting: Student in an Organized Health Care Education/Training Program

## 2024-02-07 VITALS — BP 100/60 | HR 82 | Temp 98.1°F | Ht 59.0 in | Wt 152.0 lb

## 2024-02-07 DIAGNOSIS — I272 Pulmonary hypertension, unspecified: Secondary | ICD-10-CM | POA: Diagnosis not present

## 2024-02-07 DIAGNOSIS — I340 Nonrheumatic mitral (valve) insufficiency: Secondary | ICD-10-CM

## 2024-02-07 DIAGNOSIS — J069 Acute upper respiratory infection, unspecified: Secondary | ICD-10-CM

## 2024-02-07 DIAGNOSIS — Q211 Atrial septal defect, unspecified: Secondary | ICD-10-CM | POA: Diagnosis not present

## 2024-02-07 DIAGNOSIS — J4540 Moderate persistent asthma, uncomplicated: Secondary | ICD-10-CM | POA: Diagnosis not present

## 2024-02-07 MED ORDER — PREDNISONE 20 MG PO TABS: 40.0000 mg | ORAL_TABLET | Freq: Every day | ORAL | 0 refills | Status: AC

## 2024-02-07 MED ORDER — AMOXICILLIN-POT CLAVULANATE 875-125 MG PO TABS: 1.0000 | ORAL_TABLET | Freq: Two times a day (BID) | ORAL | 0 refills | Status: AC

## 2024-02-07 NOTE — Progress Notes (Signed)
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 daily as needed., Disp: , Rfl:    escitalopram  (LEXAPRO ) 10 MG tablet, Take 10 mg by mouth daily., Disp: , Rfl:    esomeprazole  (NEXIUM ) 40 MG capsule, Take 1 capsule (40 mg total) by mouth daily., Disp: 30 capsule, Rfl: 3   estradiol  (ESTRACE ) 0.5 MG tablet, Take 1 tablet (0.5 mg total) by mouth daily., Disp: 90 tablet, Rfl: 1   fluticasone  (FLONASE ) 50 MCG/ACT nasal spray, Place 2 sprays into both nostrils daily as needed for allergies or rhinitis., Disp: 48 mL, Rfl: 1   fluticasone -salmeterol (WIXELA INHUB) 250-50 MCG/ACT AEPB, Inhale 1 puff into the lungs in the morning and at bedtime., Disp: 60 each, Rfl: 11   glucose blood (ACCU-CHEK GUIDE TEST) test strip, Use as instructed to check blood sugar daily, Disp: 100 each, Rfl: 12   hydrOXYzine (ATARAX) 25 MG tablet, Take 25 mg by mouth See admin instructions. 3-4 times daily as needed, Disp: , Rfl:    hyoscyamine  (LEVSIN ) 0.125 MG tablet, TAKE ONE TABLET BY MOUTH EVERY 6 HOURS AS NEEDED FOR STOMACH CRAMPINGS, Disp: 20 tablet, Rfl: 3   ipratropium (ATROVENT ) 0.06 % nasal spray,  Place 2 sprays into both nostrils 4 (four) times daily. (Patient taking differently: Place 2 sprays into both nostrils as needed. PRN), Disp: 15 mL, Rfl: 12   levocetirizine (XYZAL ) 5 MG tablet, TAKE 1 TABLET BY MOUTH EVERY DAY IN THE EVENING, Disp: 30 tablet, Rfl: 5   meloxicam  (MOBIC ) 15 MG tablet, Take 1 tablet (15 mg total) by mouth daily., Disp: 30 tablet, Rfl: 0   MISCELLANEOUS VAGINAL PRODUCTS VA, Place 1 capsule vaginally daily at 12 noon. [employer] Choice Vaginal Probiotic, Disp: , Rfl:    montelukast  (SINGULAIR ) 10 MG tablet, TAKE 1 TABLET BY MOUTH EVERYDAY AT BEDTIME, Disp: 90 tablet, Rfl: 3   Multiple Vitamins-Minerals (VITAFUSION MULTI WOMENS PO), Take 2 each by mouth daily at 12 noon., Disp: , Rfl:    nortriptyline  (PAMELOR ) 25 MG capsule, TAKE ONE CAPSULE AT BEDTIME FOR MIGRANE PREVENTION AND IRRITABLE BOWEL SYNDROME, Disp: 90 capsule, Rfl: 3   OVER THE COUNTER MEDICATION, Take 1 capsule by mouth daily at 12 noon. Doterra Seasonal Blend, Disp: , Rfl:    OVER THE COUNTER MEDICATION, Take 1 capsule by mouth daily at 12 noon. Doterra On [employer], Disp: , Rfl:    OVER THE COUNTER MEDICATION, Take 1 capsule by mouth daily at 12 noon. Nattura Calm Lavendar Oil, Disp: , Rfl:    OXYGEN , Inhale 2 L into the lungs at bedtime as needed., Disp: , Rfl:    predniSONE  (DELTASONE ) 20 MG tablet, Take 2 tablets (40 mg total) by mouth daily with breakfast for 5 days., Disp: 10 tablet, Rfl: 0   Probiotic Product (PROBIOTIC DAILY PO), Take 1 capsule by mouth daily at 12 noon., Disp: ,  Rfl:    promethazine -dextromethorphan (PROMETHAZINE -DM) 6.25-15 MG/5ML syrup, TAKE 2.5 MILLILITERS BY MOUTH 4 TIMES A DAY AS NEEDED FOR COUGH (ALLERGY TO DEXTROMETHORPHAN?) (Patient taking differently: 4 (four) times daily as needed for cough.), Disp: 120 mL, Rfl: 0   RYALTRIS 665-25 MCG/ACT SUSP, Place into both nostrils. (Patient taking differently: Place into both nostrils as needed.), Disp: , Rfl:     Spacer/Aero-Holding Chambers (AEROCHAMBER PLUS) inhaler, Use with inhaler, Disp: 1 each, Rfl: 2   spironolactone (ALDACTONE) 25 MG tablet, Take 12.5 mg by mouth daily., Disp: , Rfl:    Turmeric Curcumin 500 MG CAPS, Take 1 tablet by mouth daily at 12 noon., Disp: , Rfl:    UBRELVY  50 MG TABS, TAKE 50 MG BY MOUTH ONCE AS NEEDED FOR UP TO 1 DOSE (MIGRAINES, HEADACHES)., Disp: 10 tablet, Rfl: 5   umeclidinium bromide  (INCRUSE ELLIPTA ) 62.5 MCG/ACT AEPB, Inhale 1 puff into the lungs daily., Disp: 30 each, Rfl: 12   VENTOLIN  HFA 108 (90 Base) MCG/ACT inhaler, Inhale 2 puffs into the lungs every 6 (six) hours as needed for wheezing or shortness of breath., Disp: 18 g, Rfl: 3 [2]  Allergies Allergen Reactions   Acetaminophen -Codeine Nausea And Vomiting   Antiseptic Oral Rinse [Cetylpyridinium Chloride] Other (See Comments)    Mouth sores   Aspartame Other (See Comments)    Reaction: unknown   Carafate  [Sucralfate ]     Pt says her Stomach hurts when she takes it.     Chlorhexidine  Gluconate Nausea And Vomiting   Clarithromycin Nausea And Vomiting   Clindamycin  Diarrhea and Nausea And Vomiting   Clindamycin /Lincomycin Nausea And Vomiting   Codeine Itching and Nausea And Vomiting   Curly Dock (Rumex Crispus) Other (See Comments)   Dextromethorphan Hbr Other (See Comments)    Reaction: unknown   Dilaudid  [Hydromorphone  Hcl] Nausea And Vomiting   Doxycycline  Nausea And Vomiting    Tolerates as long as she has nausea medicine   Fentanyl  Nausea And Vomiting   Fluticasone -Salmeterol Other (See Comments)    Blister in mouth   Germanium Other (See Comments)   Hydrocod Poli-Chlorphe Poli Er Nausea And Vomiting   Hydrocodone Nausea And Vomiting   Hydrocodone-Acetaminophen  Nausea And Vomiting   Ketorolac  Other (See Comments)   Levofloxacin  Other (See Comments) and Nausea And Vomiting    GI upset   Mefenamic Acid Nausea And Vomiting   Metformin And Related Nausea And Vomiting   Metronidazole   Diarrhea and Nausea And Vomiting   Morphine  And Codeine Nausea And Vomiting   Moxifloxacin Swelling   Nitrofurantoin Nausea And Vomiting and Other (See Comments)   Nsaids Other (See Comments)    Reaction: unknown   Oxycodone -Acetaminophen  Nausea And Vomiting   Periguard [Dimethicone] Nausea And Vomiting   Permethrin  Other (See Comments)    Pt's mind was racing all the time.   Phenothiazines Nausea And Vomiting   Pioglitazone Nausea And Vomiting   Quinidine Nausea And Vomiting   Quinolones Nausea And Vomiting   Tetracyclines & Related Nausea And Vomiting   Toradol  [Ketorolac  Tromethamine ] Nausea And Vomiting   Tramadol  Nausea And Vomiting   Tussin [Guaifenesin ] Nausea And Vomiting   Buprenorphine Hcl Nausea And Vomiting   Lincomycin Hcl Nausea And Vomiting   Oxycodone -Acetaminophen  Hives and Nausea And Vomiting   Phenylalanine Nausea And Vomiting   "

## 2024-02-07 NOTE — Patient Instructions (Addendum)
" °  VISIT SUMMARY: Today, you were seen for an increased cough and green sputum production. You have been experiencing these symptoms for the past week, with no recent cold, fever, or chills, but you feel more tired and have slight wheezing. You are currently using your inhaler as prescribed and taking medications for your heart condition. Despite these measures, your symptoms have not improved. We discussed your current treatment plan and made some adjustments to help manage your symptoms.  YOUR PLAN: -ACUTE UPPER RESPIRATORY INFECTION: An acute upper respiratory infection is a sudden infection that affects the upper respiratory tract, which includes the nose, throat, and airways. You have been prescribed a 5-day course of prednisone  and a short course of antibiotics to help treat the infection. Continue taking Lasix  as needed, but increase the dose if swelling persists. Avoid taking Lasix  if you experience dizziness or low blood pressure. It is also recommended that you get flu, pneumonia, and RSV vaccinations, but these should be staggered due to your current illness.  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 You should continue taking Lasix  as needed and follow up with your cardiologist in February and your heart failure specialist in March.  -MODERATE PERSISTENT ASTHMA: Moderate persistent asthma is a type of asthma that requires daily medication to control symptoms. Continue using Wixela, one puff twice daily, and make sure to wash your mouth after each use to prevent any side effects.  INSTRUCTIONS: Please follow up with your cardiologist in February and your heart failure specialist in March. Make sure to get your flu, pneumonia, and RSV vaccinations, but stagger them due to your current  illness.                      Contains text generated by [employer]bridge.                                 Contains text generated by [employer]bridge.   "

## 2024-02-07 NOTE — Telephone Encounter (Signed)
"  Sent to wrong office.  "

## 2024-02-07 NOTE — Telephone Encounter (Signed)
 FYI Only or Action Required?: FYI only for provider: appointment with pulmonary scheduled .  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  Called Nurse Triage reporting Cough.  Symptoms began a week ago.  Interventions attempted: Prescription medications: inhalers and nebulizer .  Symptoms are: gradually worsening.  Triage Disposition: See Physician Within 24 Hours FYI Only or Action Required?: FYI only for provider: appointment scheduled on 02/07/2024.  Patient is followed in Pulmonology for , last seen on 01/05/2024 by Isadora Hose, MD.  Called Nurse Triage reporting Cough.  Symptoms began a week ago.  Interventions attempted: Rescue inhaler, Maintenance inhaler, and Nebulizer treatments.  Symptoms are: gradually worsening.  Triage Disposition: See Physician Within 24 Hours  Patient/caregiver understands and will follow disposition?: Yes Reason for Disposition  SEVERE coughing spells (e.g., whooping sound after coughing, vomiting after coughing)  Answer Assessment - Initial Assessment Questions Has been coughing up green getting worse. Cough since last August getting worse. Hard to cough up. Makes tired after coughing. Spo2 monitor 95-96% to 100%. some wheezing. This cough has been ongoing not getting better and worse in the last 1 week. Taking deep breaths causes cough incrus albuterol  am/pm and nebulizer , maybe little relief not a lot.       1. ONSET: When did the cough begin?      Ongoing since last August getting worse last 1 week  2. SEVERITY: How bad is the cough today?      Severe coughing fits , significant coughing throughout the call  3. SPUTUM: Describe the color of your sputum (e.g.,  none, dry cough; clear, white, yellow, green)     Green  4. HEMOPTYSIS: Are you coughing up any blood? If so ask: How much? (e.g., flecks, streaks, tablespoons, etc.)     Denies  5. DIFFICULTY BREATHING: Are you having difficulty breathing? If Yes, ask: How bad is it? (e.g., mild, moderate, severe)      Denies oxygen  saturation 95-100% 6. FEVER: Do you have a fever? If Yes, ask: What is your temperature, how was it measured, and when did it start?     Denies  7. CARDIAC HISTORY: Do you have any history of heart disease? (e.g., heart attack, congestive heart failure)      1st degree AV block ASD (atrial septal defect)  8. LUNG HISTORY: Do you have any history of lung disease?  (e.g., pulmonary embolus, asthma, emphysema)     Seeing pulmonary   . Has Peripheral pulmonary artery stenosis 9. PE RISK FACTORS: Do you have a history of blood clots? (or: recent major surgery, recent prolonged travel, bedridden)     Denies  10. OTHER SYMPTOMS: Do you have any other symptoms? (e.g., runny nose, wheezing, chest pain)       Runny nose relates to nebulizer ,. Has been tired relates to heart issue had stress test. 2 weeks ago.     Patient denies the following chest pain when not coughing, difficulty breahting  Protocols used: Cough - Chronic-A-AH Patient/caregiver understands and will follow disposition?:  Copied from CRM [id number]. Topic: Clinical - Red Word Triage >> Feb 07, 2024 11:13 AM Leila BROCKS wrote: Red Word that prompted transfer to Nurse Triage: Patient [phone] states pcp advised patient to contact Dr. Isadora, because symptoms have worsen, more coughing and  phlegm greenish, chest tightness, unsure of wheezing. Patient denies shortness of breath, fever, nor dizziness. Patient has been using inhaler and nebulizer and it's not helping. Please advise.

## 2024-02-08 NOTE — Telephone Encounter (Signed)
 Patient was seen yesterday in our office.  Nothing further needed.

## 2024-02-17 ENCOUNTER — Telehealth

## 2024-02-23 ENCOUNTER — Other Ambulatory Visit: Payer: Self-pay
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
## 2024-02-23 NOTE — Patient Instructions (Signed)
 Visit Information  Ms. Ramseyer was given information about Medicaid Managed Care team care coordination services as a part of their Healthy [facility] Medicaid benefit. [name]   If you would like to schedule transportation through your Healthy [facility] plan, please call the following number at least 2 days in advance of your appointment: [phone]  For information about your ride after you set it up, call Ride Assist at [phone]. Use this number to activate a Will Call pickup, or if your transportation is late for a scheduled pickup. Use this number, too, if you need to make a change or cancel a previously scheduled reservation.  If you need transportation services right away, call [phone]. The after-hours call center is staffed 24 hours to handle ride assistance and urgent reservation requests (including discharges) 365 days a year. Urgent trips include sick visits, hospital discharge requests and life-sustaining treatment.  Call the [facility] Line at [phone], at any time, 24 hours a day, 7 days a week. If you are in danger or need immediate medical attention call 911.   Please see education materials related to Heart Failure Action Plan provided as print materials.  The patient verbalized understanding of instructions, educational materials, and care plan provided today and agreed to receive a mailed copy of patient instructions, educational materials, and care plan.   Telephone follow up appointment with Managed Medicaid care management team member scheduled for: 03/22/2024 at 10:00 am  [name], MSN, RN [place]  [facility], [facility] RN Care Manager Direct Dial: [phone] Fax: [phone]   Following is a copy of your plan of care:   Goals Addressed             This Visit's Progress    [facility] RN Care Plan - PulmHTN/HF   No change    Problems:  Chronic Disease Management support and education needs related  to CHF/Pulmonary HTN  Goal: Over the next 90 days the Patient will attend all scheduled medical appointments: Patient will attend all appointments as evidenced by chart review        continue to work with RN Care Manager and/or Social Worker to address care management and care coordination needs related to CHF and Pulmonary HTN as evidenced by adherence to care management team scheduled appointments     take all medications exactly as prescribed and will call provider for medication related questions as evidenced by patient report    verbalize basic understanding of CHF and Pulmonary HTN disease process and self health management plan as evidenced by Taking medications as prescribed and as needed including newly prescribed prn Lasix , promptly reporting changes/concerns to provider, following HF Action Plan, complete all ordered testing, follow heart healthy low salt diet, exercise as tolerated O2 2L at night and prn during day, notifying provider for increased need for O2 during the day.    Interventions:   Heart Failure Interventions: Basic overview and discussion of pathophysiology of Heart Failure reviewed Provided education on low sodium diet Reviewed Heart Failure Action Plan in depth and provided written copy - resending via mail and reviewed at each visit - patient instructed to call HF Clinic today regarding HF sx and red flags for ED discussed Advised patient to weigh each morning after emptying bladder Discussed importance of daily weight and advised patient to weigh and record daily Reviewed role of diuretics in prevention of fluid overload and management of heart failure; Spironolactone daily and prn Lasix  Discussed the importance of keeping  all appointments with provider Screening for signs and symptoms of depression related to chronic disease state  Assessed social determinant of health barriers   Patient Self-Care Activities:  Attend all scheduled provider appointments Call  pharmacy for medication refills 3-7 days in advance of running out of medications Call provider office for new concerns or questions  Take medications as prescribed   call office if I gain more than 2 pounds in one day or 5 pounds in one week do ankle pumps when sitting keep legs up while sitting track weight in diary use salt in moderation watch for swelling in feet, ankles and legs every day weigh myself daily follow rescue plan if symptoms flare-up eat more whole grains, fruits and vegetables, lean meats and healthy fats know when to call the doctor:per provider instructions and HF Action Plan  Plan:  Telephone follow up appointment with care management team member scheduled for:  03/22/2024 at 10:00 am               Heart Failure Action Plan A heart failure action plan helps you know what to do when you have symptoms of heart failure. Your action plan is a color-coded plan that lists the symptoms to watch for and indicates what actions to take. If you have symptoms in the green zone, you're doing well. If you have symptoms in the yellow zone, you're having problems. If you have symptoms in the red zone, you need medical care right away. Follow the plan that was created by you and your health care provider. Review your plan each time you visit your provider. Green zone These signs mean you're doing well and can continue what you're doing: You don't have new or worsening shortness of breath. You have very little swelling or no new swelling. Your weight is stable (no gain or loss). You have a normal activity level. You don't have chest pain or any other new symptoms. Yellow zone These signs and symptoms mean your condition may be getting worse and you should make some changes: You have trouble breathing when you're active. You have swelling in your feet or legs or have discomfort in your belly. You gain 2-3 lb (0.9-1.4 kg) in 24 hours, or 5 lb (2.3 kg) in a week. This amount  may be more or less depending on your condition. You get tired easily. You have trouble sleeping. You have a dry cough. If you have any of these symptoms: Contact your provider within the next day. Your provider may adjust your medicines. Red zone These signs and symptoms mean you should get medical help right away: You have trouble breathing when resting or cannot lie flat and you need to raise your head to help you breathe. You have a dry cough that's getting worse. You have swelling or pain in your feet or legs or discomfort in your belly that's getting worse. You suddenly gain more than 2-3 lb (0.9-1.4 kg) in 24 hours, or more than 5 lb (2.3 kg) in a week. This amount may be more or less depending on your condition. You have trouble staying awake or you feel confused. You don't have an appetite. You have worsening sadness or depression. These symptoms may be an emergency. Call 911 right away. Do not wait to see if the symptoms will go away. Do not drive yourself to the hospital. Follow these instructions at home: Take medicines only as told. Eat a heart-healthy diet. Work with a dietitian to create an eating plan that's  best for you. Weigh yourself each day. Your target weight is __________ lb (__________ kg). Call your provider if you gain more than __________ lb (__________ kg) in 24 hours, or more than __________ lb (__________ kg) in a week. Health care provider name: _____________________________________________________ Health care provider phone number: _____________________________________________________ Where to find more information American Heart Association: heart.org This information is not intended to replace advice given to you by your health care provider. Make sure you discuss any questions you have with your health care provider. Document Revised: 09/03/2022 Document Reviewed: 09/03/2022 Elsevier Patient Education  2024 Else[employer] Inc.  Pulmonary  Hypertension Pulmonary hypertension is a condition that causes high blood pressure in the blood vessels of your lungs. This makes your heart work extra hard to pump blood to your lungs. And when your heart has to work harder, it can be harder for you to breathe. Over time, this can hurt and weaken your heart. What are the causes? This condition can be put into one of five groups based on what causes it. Group 1 happens when the blood vessels that carry blood to your lungs get too thick or stiff. This may happen with no known cause or it may be: Inherited. This means it's passed from parent to child. Caused by another disease, such as a disease of the heart or liver. Caused by some medicines or poisons. Group 2 happens if you have problems with the valves in your heart or if the left side of your heart, also called your left ventricle, gets weak. Group 3 can be caused by long-term diseases of the lungs, such as chronic obstructive pulmonary disease or COPD. This can also happen if you don't get enough oxygen , such as if you have trouble breathing when you sleep or if you live at a high altitude. Group 4 is caused by blood clots in your lungs. Group 5 includes other causes, such as sickle cell anemia or growths of cells that aren't normal called tumors. What are the signs or symptoms? Symptoms may include: Shortness of breath. A cough. Tiredness. Feeling dizzy or light-headed. You may also faint. A fast heart beat. You may also feel your heart flutter or skip a beat. Your lips or fingers turning blue. Chest pain or tightness. How is this diagnosed? This condition may be diagnosed with: Blood tests. Imaging tests. These may include: Chest X-rays. CT scans. An echocardiogram. This is an ultrasound of your heart. A ventilation-perfusion scan. This sees how well air and blood flow in and out of your lungs. A pulmonary function test. This looks at how much air your lungs can hold. A 6-minute  walk test. This may be done to check your breathing during exercise. Cardiac catheterization. This is a procedure that uses a soft tube called a catheter to check the arteries of your heart. A lung biopsy. This is when a small piece of tissue is removed from your lung for testing. How is this treated? There's no cure for this condition. But treatment can help you feel better and can slow the condition down. You may need: Oxygen  therapy. Cardiac rehabilitation, or rehab. This is a program that teaches you how to: Care for your heart. Exercise. Go back to your normal activities. Medicines to: Lower your blood pressure. Relax the blood vessels in your lungs. Help your heart pump more blood. Help your body get rid of extra fluid. Thin your blood. This can stop you from getting blood clots. Lung surgery. This can relieve pressure  on your heart. You may need this if other treatments don't work. Heart-lung or lung transplant. This may be done in very severe cases. Follow these instructions at home: Eating and drinking  Eat a healthy diet. Eat lots of fresh fruits and vegetables, whole grains, and beans. Limit how much salt you take in to less than 2,300 mg a day. Salt is also called sodium. Activity Get lots of rest. Exercise as told. Ask your health care provider what types of exercise are safe for you. Lifestyle Do not use any products that contain nicotine or tobacco. These products include cigarettes, chewing tobacco, and vaping devices, such as e-cigarettes. If you need help quitting, ask your provider. Stay away when other people smoke. Do not sit in hot tubs or saunas for long stretches of time. Do not get pregnant. If needed, talk with your provider about birth control. Avoid high altitudes. It can be stressful to live with pulmonary hypertension. Talk with your provider about finding support groups and online help. General instructions Take over-the-counter and prescription  medicines only as told by your provider. Do not change or stop medicines without talking with your provider. Stay up to date on your shots, such as your yearly flu shot and pneumonia shot. Use oxygen  therapy at home as told. Keep all follow-up visits. Your provider will check your breathing and make changes to your medicines as needed. Contact a health care provider if: Your cough gets worse. You have more shortness of breath. You start to have trouble doing things you could do before. You need to use more medicines or oxygen , or you need to use them more often than normal. Get help right away if: You have severe shortness of breath. You have chest pain or pressure. You cough up blood. These symptoms may be an emergency. Get help right away. Call 911. Do not wait to see if the symptoms will go away. Do not drive yourself to the hospital. This information is not intended to replace advice given to you by your health care provider. Make sure you discuss any questions you have with your health care provider. Document Revised: 04/06/2022 Document Reviewed: 04/06/2022 Elsevier Patient Education  2024 Elsevier Inc.  Managing Depression, Adult Depression is a mental health condition that affects your thoughts, feelings, and actions. Being diagnosed with depression can bring you relief if you did not know why you have felt or behaved a certain way. It could also leave you feeling overwhelmed. Finding ways to manage your symptoms can help you feel more positive about your future. How to manage lifestyle changes Being depressed is difficult. Depression can increase the level of everyday stress. Stress can make depression symptoms worse. You may believe your symptoms cannot be managed or will never improve. However, there are many things you can try to help manage your symptoms. There is hope. Managing stress  Stress is your body's reaction to life changes and events, both good and bad. Stress can  add to your feelings of depression. Learning to manage your stress can help lessen your feelings of depression. Try some of the following approaches to reducing your stress (stress reduction techniques): Listen to music that you enjoy and that inspires you. Try using a meditation app or take a meditation class. Develop a practice that helps you connect with your spiritual self. Walk in nature, pray, or go to a place of worship. Practice deep breathing. To do this, inhale slowly through your nose. Pause at the top of your  inhale for a few seconds and then exhale slowly, letting yourself relax. Repeat this three or four times. Practice yoga to help relax and work your muscles. Choose a stress reduction technique that works for you. These techniques take time and practice to develop. Set aside 5-15 minutes a day to do them. Therapists can offer training in these techniques. Do these things to help manage stress: Keep a journal. Know your limits. Set healthy boundaries for yourself and others, such as saying no when you think something is too much. Pay attention to how you react to certain situations. You may not be able to control everything, but you can change your reaction. Add humor to your life by watching funny movies or shows. Make time for activities that you enjoy and that relax you. Spend less time using electronics, especially at night before bed. The light from screens can make your brain think it is time to get up rather than go to bed.  Medicines Medicines, such as antidepressants, are often a part of treatment for depression. Talk with your pharmacist or health care provider about all the medicines, supplements, and herbal products that you take, their possible side effects, and what medicines and other products are safe to take together. Make sure to report any side effects you may have to your health care provider. Relationships Your health care provider may suggest family therapy,  couples therapy, or individual therapy as part of your treatment. How to recognize changes Everyone responds differently to treatment for depression. As you recover from depression, you may start to: Have more interest in doing activities. Feel more hopeful. Have more energy. Eat a more regular amount of food. Have better mental focus. It is important to recognize if your depression is not getting better or is getting worse. The symptoms you had in the beginning may return, such as: Feeling tired. Eating too much or too little. Sleeping too much or too little. Feeling restless, agitated, or hopeless. Trouble focusing or making decisions. Having unexplained aches and pains. Feeling irritable, angry, or aggressive. If you or your family members notice these symptoms coming back, let your health care provider know right away. Follow these instructions at home: Activity Try to get some form of exercise each day, such as walking. Try yoga, mindfulness, or other stress reduction techniques. Participate in group activities if you are able. Lifestyle Get enough sleep. Cut down on or stop using caffeine, tobacco, alcohol, and any other harmful substances. Eat a healthy diet that includes plenty of vegetables, fruits, whole grains, low-fat dairy products, and lean protein. Limit foods that are high in solid fats, added sugar, or salt (sodium). General instructions Take over-the-counter and prescription medicines only as told by your health care provider. Keep all follow-up visits. It is important for your health care provider to check on your mood, behavior, and medicines. Your health care provider may need to make changes to your treatment. Where to find support Talking to others  Friends and family members can be sources of support and guidance. Talk to trusted friends or family members about your condition. Explain your symptoms and let them know that you are working with a health care  provider to treat your depression. Tell friends and family how they can help. Finances Find mental health providers that fit with your financial situation. Talk with your health care provider if you are worried about access to food, housing, or medicine. Call your insurance company to learn about your co-pays and prescription plan.  Where to find more information You can find support in your area from: Anxiety and Depression Association of America (ADAA): adaa.org Mental Health America: mentalhealthamerica.net [employer] on Mental Illness: nami.org Contact a health care provider if: You stop taking your antidepressant medicines, and you have any of these symptoms: Nausea. Headache. Light-headedness. Chills and body aches. Not being able to sleep (insomnia). You or your friends and family think your depression is getting worse. Get help right away if: You have thoughts of hurting yourself or others. Get help right away if you feel like you may hurt yourself or others, or have thoughts about taking your own life. Go to your nearest emergency room or: Call 911. Call the National Suicide Prevention Lifeline at 709-834-4288 or 988. This is open 24 hours a day. Text the Crisis Text Line at 620-360-6433. This information is not intended to replace advice given to you by your health care provider. Make sure you discuss any questions you have with your health care provider. Document Revised: 05/27/2021 Document Reviewed: 05/27/2021 Elsevier Patient Education  2024 [employer].

## 2024-02-25 ENCOUNTER — Other Ambulatory Visit: Payer: Self-pay

## 2024-02-25 ENCOUNTER — Encounter: Payer: Self-pay | Admitting: Family Medicine

## 2024-02-25 DIAGNOSIS — R109 Unspecified abdominal pain: Secondary | ICD-10-CM

## 2024-02-25 MED ORDER — HYOSCYAMINE SULFATE 0.125 MG PO TABS: ORAL_TABLET | ORAL | 3 refills | Status: AC

## 2024-03-07 ENCOUNTER — Other Ambulatory Visit: Payer: Self-pay | Admitting: Family Medicine

## 2024-03-07 DIAGNOSIS — Z1231 Encounter for screening mammogram for malignant neoplasm of breast: Secondary | ICD-10-CM

## 2024-03-08 ENCOUNTER — Ambulatory Visit: Admitting: Student in an Organized Health Care Education/Training Program

## 2024-03-09 ENCOUNTER — Other Ambulatory Visit
Admission: RE | Admit: 2024-03-09 | Discharge: 2024-03-09 | Disposition: A | Source: Ambulatory Visit | Attending: Pulmonary Disease | Admitting: Pulmonary Disease

## 2024-03-09 LAB — D-DIMER, QUANTITATIVE: D-Dimer, Quant: 0.31 ug{FEU}/mL (ref 0.00–0.50)

## 2024-03-22 ENCOUNTER — Telehealth

## 2024-03-29 ENCOUNTER — Encounter

## 2024-07-17 ENCOUNTER — Encounter: Admitting: Family Medicine
